# Patient Record
Sex: Female | Born: 1945 | Race: White | Hispanic: No | Marital: Married | Smoking: Current every day smoker
Health system: Southern US, Community
[De-identification: ages and names within clinical notes are randomized; demographics above are authoritative.]

## PROBLEM LIST (undated history)

## (undated) DIAGNOSIS — M1990 Unspecified osteoarthritis, unspecified site: Secondary | ICD-10-CM

## (undated) DIAGNOSIS — R591 Generalized enlarged lymph nodes: Secondary | ICD-10-CM

## (undated) DIAGNOSIS — M810 Age-related osteoporosis without current pathological fracture: Secondary | ICD-10-CM

## (undated) DIAGNOSIS — Z860100 Personal history of colon polyps, unspecified: Secondary | ICD-10-CM

## (undated) DIAGNOSIS — R519 Headache, unspecified: Secondary | ICD-10-CM

## (undated) DIAGNOSIS — F1910 Other psychoactive substance abuse, uncomplicated: Secondary | ICD-10-CM

## (undated) DIAGNOSIS — J449 Chronic obstructive pulmonary disease, unspecified: Secondary | ICD-10-CM

## (undated) DIAGNOSIS — F419 Anxiety disorder, unspecified: Secondary | ICD-10-CM

## (undated) DIAGNOSIS — C8590 Non-Hodgkin lymphoma, unspecified, unspecified site: Secondary | ICD-10-CM

## (undated) DIAGNOSIS — C801 Malignant (primary) neoplasm, unspecified: Secondary | ICD-10-CM

## (undated) DIAGNOSIS — E669 Obesity, unspecified: Secondary | ICD-10-CM

## (undated) DIAGNOSIS — F329 Major depressive disorder, single episode, unspecified: Secondary | ICD-10-CM

## (undated) DIAGNOSIS — E039 Hypothyroidism, unspecified: Secondary | ICD-10-CM

## (undated) DIAGNOSIS — E7800 Pure hypercholesterolemia, unspecified: Secondary | ICD-10-CM

## (undated) DIAGNOSIS — R599 Enlarged lymph nodes, unspecified: Secondary | ICD-10-CM

## (undated) DIAGNOSIS — I10 Essential (primary) hypertension: Secondary | ICD-10-CM

## (undated) DIAGNOSIS — F17200 Nicotine dependence, unspecified, uncomplicated: Secondary | ICD-10-CM

## (undated) HISTORY — DX: Age-related osteoporosis without current pathological fracture: M81.0

## (undated) HISTORY — DX: Major depressive disorder, single episode, unspecified: F32.9

## (undated) HISTORY — PX: DILATION AND CURETTAGE OF UTERUS: SHX78

## (undated) HISTORY — PX: FOREARM FRACTURE SURGERY: SHX649

## (undated) HISTORY — DX: Unspecified osteoarthritis, unspecified site: M19.90

## (undated) HISTORY — DX: Obesity, unspecified: E66.9

## (undated) HISTORY — DX: Other psychoactive substance abuse, uncomplicated: F19.10

## (undated) HISTORY — PX: TONSILLECTOMY: SUR1361

## (undated) HISTORY — DX: Nicotine dependence, unspecified, uncomplicated: F17.200

## (undated) HISTORY — DX: Essential (primary) hypertension: I10

## (undated) HISTORY — DX: Headache, unspecified: R51.9

## (undated) HISTORY — DX: Personal history of colon polyps, unspecified: Z86.0100

## (undated) HISTORY — DX: Hypothyroidism, unspecified: E03.9

## (undated) HISTORY — DX: Anxiety disorder, unspecified: F41.9

## (undated) HISTORY — DX: Pure hypercholesterolemia, unspecified: E78.00

## (undated) HISTORY — DX: Personal history of colonic polyps: Z86.010

---
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
## 1999-08-09 ENCOUNTER — Encounter: Payer: Self-pay | Admitting: Obstetrics and Gynecology

## 1999-08-13 ENCOUNTER — Encounter (INDEPENDENT_AMBULATORY_CARE_PROVIDER_SITE_OTHER): Payer: Self-pay | Admitting: Specialist

## 1999-08-13 ENCOUNTER — Ambulatory Visit (HOSPITAL_COMMUNITY): Admission: RE | Admit: 1999-08-13 | Discharge: 1999-08-13 | Payer: Self-pay | Admitting: Obstetrics and Gynecology

## 2000-06-21 ENCOUNTER — Encounter: Admission: RE | Admit: 2000-06-21 | Discharge: 2000-06-21 | Payer: Self-pay | Admitting: Obstetrics & Gynecology

## 2000-06-21 ENCOUNTER — Encounter: Payer: Self-pay | Admitting: Family Medicine

## 2000-11-29 ENCOUNTER — Encounter: Admission: RE | Admit: 2000-11-29 | Discharge: 2000-11-29 | Payer: Self-pay | Admitting: Family Medicine

## 2000-11-29 ENCOUNTER — Encounter: Payer: Self-pay | Admitting: Family Medicine

## 2002-01-17 ENCOUNTER — Encounter: Admission: RE | Admit: 2002-01-17 | Discharge: 2002-02-05 | Payer: Self-pay | Admitting: General Surgery

## 2002-01-17 ENCOUNTER — Encounter: Admission: RE | Admit: 2002-01-17 | Discharge: 2002-01-17 | Payer: Self-pay | Admitting: General Surgery

## 2002-01-17 ENCOUNTER — Encounter: Payer: Self-pay | Admitting: General Surgery

## 2002-01-21 ENCOUNTER — Encounter: Admission: RE | Admit: 2002-01-21 | Discharge: 2002-01-21 | Payer: Self-pay | Admitting: General Surgery

## 2002-01-21 ENCOUNTER — Encounter: Payer: Self-pay | Admitting: General Surgery

## 2002-01-28 ENCOUNTER — Encounter: Admission: RE | Admit: 2002-01-28 | Discharge: 2002-02-05 | Payer: Self-pay | Admitting: General Surgery

## 2002-02-14 ENCOUNTER — Encounter: Admission: RE | Admit: 2002-02-14 | Discharge: 2002-05-15 | Payer: Self-pay | Admitting: General Surgery

## 2002-03-13 ENCOUNTER — Inpatient Hospital Stay (HOSPITAL_COMMUNITY): Admission: RE | Admit: 2002-03-13 | Discharge: 2002-03-16 | Payer: Self-pay | Admitting: General Surgery

## 2002-03-14 ENCOUNTER — Encounter: Payer: Self-pay | Admitting: General Surgery

## 2003-07-01 ENCOUNTER — Encounter: Admission: RE | Admit: 2003-07-01 | Discharge: 2003-07-01 | Payer: Self-pay | Admitting: Family Medicine

## 2003-12-08 ENCOUNTER — Ambulatory Visit (HOSPITAL_COMMUNITY): Admission: RE | Admit: 2003-12-08 | Discharge: 2003-12-08 | Payer: Self-pay | Admitting: Gastroenterology

## 2003-12-08 ENCOUNTER — Encounter (INDEPENDENT_AMBULATORY_CARE_PROVIDER_SITE_OTHER): Payer: Self-pay | Admitting: *Deleted

## 2004-04-25 HISTORY — PX: GASTRIC BYPASS: SHX52

## 2004-04-25 HISTORY — PX: BARIATRIC SURGERY: SHX1103

## 2005-09-29 ENCOUNTER — Encounter: Admission: RE | Admit: 2005-09-29 | Discharge: 2005-09-29 | Payer: Self-pay | Admitting: Family Medicine

## 2006-10-06 ENCOUNTER — Encounter: Admission: RE | Admit: 2006-10-06 | Discharge: 2006-10-06 | Payer: Self-pay | Admitting: Family Medicine

## 2007-11-08 ENCOUNTER — Encounter: Admission: RE | Admit: 2007-11-08 | Discharge: 2007-11-08 | Payer: Self-pay | Admitting: Family Medicine

## 2008-08-26 ENCOUNTER — Ambulatory Visit: Payer: Self-pay | Admitting: Internal Medicine

## 2008-08-26 DIAGNOSIS — E039 Hypothyroidism, unspecified: Secondary | ICD-10-CM

## 2008-08-26 DIAGNOSIS — F32A Depression, unspecified: Secondary | ICD-10-CM | POA: Insufficient documentation

## 2008-08-26 DIAGNOSIS — F329 Major depressive disorder, single episode, unspecified: Secondary | ICD-10-CM | POA: Insufficient documentation

## 2008-08-26 DIAGNOSIS — I10 Essential (primary) hypertension: Secondary | ICD-10-CM

## 2008-08-26 DIAGNOSIS — F3289 Other specified depressive episodes: Secondary | ICD-10-CM

## 2008-08-26 DIAGNOSIS — M8580 Other specified disorders of bone density and structure, unspecified site: Secondary | ICD-10-CM | POA: Insufficient documentation

## 2008-08-26 HISTORY — DX: Essential (primary) hypertension: I10

## 2008-08-26 HISTORY — DX: Hypothyroidism, unspecified: E03.9

## 2008-08-26 HISTORY — DX: Major depressive disorder, single episode, unspecified: F32.9

## 2008-08-26 HISTORY — DX: Other specified depressive episodes: F32.89

## 2008-11-19 ENCOUNTER — Ambulatory Visit: Payer: Self-pay | Admitting: Internal Medicine

## 2008-11-19 LAB — CONVERTED CEMR LAB
ALT: 19 units/L (ref 0–35)
AST: 26 units/L (ref 0–37)
Albumin: 3.8 g/dL (ref 3.5–5.2)
Alkaline Phosphatase: 54 units/L (ref 39–117)
BUN: 9 mg/dL (ref 6–23)
Basophils Absolute: 0 10*3/uL (ref 0.0–0.1)
Basophils Relative: 0.4 % (ref 0.0–3.0)
Bilirubin Urine: NEGATIVE
Bilirubin, Direct: 0.1 mg/dL (ref 0.0–0.3)
CO2: 31 meq/L (ref 19–32)
Calcium: 9.2 mg/dL (ref 8.4–10.5)
Chloride: 110 meq/L (ref 96–112)
Cholesterol: 235 mg/dL — ABNORMAL HIGH (ref 0–200)
Creatinine, Ser: 0.7 mg/dL (ref 0.4–1.2)
Direct LDL: 148.5 mg/dL
Eosinophils Absolute: 0.1 10*3/uL (ref 0.0–0.7)
Eosinophils Relative: 2.2 % (ref 0.0–5.0)
GFR calc non Af Amer: 89.98 mL/min (ref >60)
Glucose, Bld: 86 mg/dL (ref 70–99)
Glucose, Urine, Semiquant: NEGATIVE
HCT: 42.5 % (ref 36.0–46.0)
HDL: 72.8 mg/dL (ref >39.00)
Hemoglobin: 14.3 g/dL (ref 12.0–15.0)
Ketones, urine, test strip: NEGATIVE
Lymphocytes Relative: 29.3 % (ref 12.0–46.0)
Lymphs Abs: 1.7 10*3/uL (ref 0.7–4.0)
MCHC: 33.6 g/dL (ref 30.0–36.0)
MCV: 95.2 fL (ref 78.0–100.0)
Monocytes Absolute: 0.4 10*3/uL (ref 0.1–1.0)
Monocytes Relative: 6.7 % (ref 3.0–12.0)
Neutro Abs: 3.7 10*3/uL (ref 1.4–7.7)
Neutrophils Relative %: 61.4 % (ref 43.0–77.0)
Nitrite: NEGATIVE
Platelets: 267 10*3/uL (ref 150.0–400.0)
Potassium: 4.3 meq/L (ref 3.5–5.1)
Protein, U semiquant: NEGATIVE
RBC: 4.47 M/uL (ref 3.87–5.11)
RDW: 13.2 % (ref 11.5–14.6)
Sodium: 146 meq/L — ABNORMAL HIGH (ref 135–145)
Specific Gravity, Urine: 1.01
TSH: 1.55 microintl units/mL (ref 0.35–5.50)
Total Bilirubin: 0.7 mg/dL (ref 0.3–1.2)
Total CHOL/HDL Ratio: 3
Total Protein: 6.7 g/dL (ref 6.0–8.3)
Triglycerides: 104 mg/dL (ref 0.0–149.0)
Urobilinogen, UA: 0.2
VLDL: 20.8 mg/dL (ref 0.0–40.0)
WBC Urine, dipstick: NEGATIVE
WBC: 5.9 10*3/uL (ref 4.5–10.5)
pH: 7

## 2008-11-25 ENCOUNTER — Encounter: Payer: Self-pay | Admitting: Internal Medicine

## 2008-11-26 ENCOUNTER — Ambulatory Visit: Payer: Self-pay | Admitting: Internal Medicine

## 2008-11-26 ENCOUNTER — Other Ambulatory Visit: Admission: RE | Admit: 2008-11-26 | Discharge: 2008-11-26 | Payer: Self-pay | Admitting: Internal Medicine

## 2008-11-26 ENCOUNTER — Encounter: Payer: Self-pay | Admitting: Internal Medicine

## 2009-01-13 ENCOUNTER — Telehealth: Payer: Self-pay | Admitting: Internal Medicine

## 2009-05-28 ENCOUNTER — Ambulatory Visit: Payer: Self-pay | Admitting: Internal Medicine

## 2009-12-25 ENCOUNTER — Ambulatory Visit: Payer: Self-pay | Admitting: Internal Medicine

## 2009-12-25 DIAGNOSIS — F17200 Nicotine dependence, unspecified, uncomplicated: Secondary | ICD-10-CM

## 2009-12-25 HISTORY — DX: Nicotine dependence, unspecified, uncomplicated: F17.200

## 2010-01-11 ENCOUNTER — Ambulatory Visit: Payer: Self-pay | Admitting: Internal Medicine

## 2010-04-30 ENCOUNTER — Other Ambulatory Visit: Payer: Self-pay | Admitting: Internal Medicine

## 2010-04-30 ENCOUNTER — Ambulatory Visit
Admission: RE | Admit: 2010-04-30 | Discharge: 2010-04-30 | Payer: Self-pay | Source: Home / Self Care | Attending: Internal Medicine | Admitting: Internal Medicine

## 2010-04-30 LAB — BASIC METABOLIC PANEL
BUN: 11 mg/dL (ref 6–23)
CO2: 27 mEq/L (ref 19–32)
Calcium: 9.2 mg/dL (ref 8.4–10.5)
Chloride: 108 mEq/L (ref 96–112)
Creatinine, Ser: 0.7 mg/dL (ref 0.4–1.2)
GFR: 85.33 mL/min (ref >60.00)
Glucose, Bld: 86 mg/dL (ref 70–99)
Potassium: 3.9 mEq/L (ref 3.5–5.1)
Sodium: 145 mEq/L (ref 135–145)

## 2010-04-30 LAB — LDL CHOLESTEROL, DIRECT: Direct LDL: 133.4 mg/dL

## 2010-04-30 LAB — CBC WITH DIFFERENTIAL/PLATELET
Basophils Absolute: 0 10*3/uL (ref 0.0–0.1)
Basophils Relative: 0.7 % (ref 0.0–3.0)
Eosinophils Absolute: 0.1 10*3/uL (ref 0.0–0.7)
Eosinophils Relative: 1 % (ref 0.0–5.0)
HCT: 41.1 % (ref 36.0–46.0)
Hemoglobin: 13.7 g/dL (ref 12.0–15.0)
Lymphocytes Relative: 30.3 % (ref 12.0–46.0)
Lymphs Abs: 1.7 10*3/uL (ref 0.7–4.0)
MCHC: 33.3 g/dL (ref 30.0–36.0)
MCV: 96 fl (ref 78.0–100.0)
Monocytes Absolute: 0.4 10*3/uL (ref 0.1–1.0)
Monocytes Relative: 8 % (ref 3.0–12.0)
Neutro Abs: 3.3 10*3/uL (ref 1.4–7.7)
Neutrophils Relative %: 60 % (ref 43.0–77.0)
Platelets: 295 10*3/uL (ref 150.0–400.0)
RBC: 4.28 Mil/uL (ref 3.87–5.11)
RDW: 13.5 % (ref 11.5–14.6)
WBC: 5.6 10*3/uL (ref 4.5–10.5)

## 2010-04-30 LAB — HEPATIC FUNCTION PANEL
ALT: 21 U/L (ref 0–35)
AST: 21 U/L (ref 0–37)
Albumin: 3.5 g/dL (ref 3.5–5.2)
Alkaline Phosphatase: 67 U/L (ref 39–117)
Bilirubin, Direct: 0.1 mg/dL (ref 0.0–0.3)
Total Bilirubin: 0.6 mg/dL (ref 0.3–1.2)
Total Protein: 6.2 g/dL (ref 6.0–8.3)

## 2010-04-30 LAB — CONVERTED CEMR LAB
Bilirubin Urine: NEGATIVE
Glucose, Urine, Semiquant: NEGATIVE
Ketones, urine, test strip: NEGATIVE
Nitrite: NEGATIVE
Protein, U semiquant: NEGATIVE
Specific Gravity, Urine: 1.01
Urobilinogen, UA: 0.2
WBC Urine, dipstick: NEGATIVE
pH: 5

## 2010-04-30 LAB — LIPID PANEL
Cholesterol: 210 mg/dL — ABNORMAL HIGH (ref 0–200)
HDL: 64 mg/dL (ref >39.00)
Total CHOL/HDL Ratio: 3
Triglycerides: 110 mg/dL (ref 0.0–149.0)
VLDL: 22 mg/dL (ref 0.0–40.0)

## 2010-04-30 LAB — TSH: TSH: 0.07 u[IU]/mL — ABNORMAL LOW (ref 0.35–5.50)

## 2010-05-07 ENCOUNTER — Ambulatory Visit
Admission: RE | Admit: 2010-05-07 | Discharge: 2010-05-07 | Payer: Self-pay | Source: Home / Self Care | Attending: Internal Medicine | Admitting: Internal Medicine

## 2010-05-07 ENCOUNTER — Other Ambulatory Visit
Admission: RE | Admit: 2010-05-07 | Discharge: 2010-05-07 | Payer: Self-pay | Source: Home / Self Care | Admitting: Internal Medicine

## 2010-05-07 ENCOUNTER — Encounter: Payer: Self-pay | Admitting: Internal Medicine

## 2010-05-07 LAB — HM PAP SMEAR

## 2010-05-25 NOTE — Progress Notes (Signed)
  Phone Note From Pharmacy   Caller: sara from [facility] Summary of Call: patient spilled coffee over her xanax bottle- can she have an early refill?    ph- [phone] Initial call taken by: Raechel Ache, RN,  January 13, 2009 9:51 AM  Follow-up for Phone Call        ok Follow-up by: Gordy Savers  MD,  January 13, 2009 12:51 PM  Additional Follow-up for Phone Call Additional follow up Details #1::        Pharmacist called Additional Follow-up by: Raechel Ache, RN,  January 13, 2009 1:23 PM

## 2010-05-25 NOTE — Assessment & Plan Note (Signed)
Summary: new pt establish/jls   Vital Signs:  Patient profile:   65 year old female Height:      64 inches Weight:      196 pounds BMI:     33.76 Temp:     98 degrees F oral Pulse rate:   64 / minute Pulse rhythm:   regular BP sitting:   128 / 66  (left arm) Cuff size:   regular  Vitals Entered By: Raechel Ache, RN (Aug 26, 2008 2:54 PM) CC: New, to Establish   CC:  New and to Establish.  History of Present Illness: 65 year old patient seen today to establish with our practice.  Medical problems include exogenous obesity.  Two status post gastric bypass in 2003.  She has treated hypertension.  History depression, and hypothyroidism.  She has osteopenia.  She also is a history of ongoing tobacco use  Preventive Screening-Counseling & Management     Smoking Status: current     Does Patient Exercise: yes  Allergies (verified): 1)  ! Astramorph (Morphine Sulfate)  Past History:  Past Medical History:    Hypertension    Osteopenia    obesity    tobacco abuse    Hypothyroidism    Depression  Past Surgical History:    gastric bypass surgery in November 2003    surgery for fracture, right arm, October 2006    D&Cs x 2    C-section 1980    gravida 3, para 3, abortus zero    negative stress echocardiogram July 2009    colonoscopy due Summer 2010 ( Dr. Loreta Ave)  Family History:    Reviewed history and no changes required:        father died age 53, cornea artery disease, congestive heart failure       mother died age 46.  History colon cancer cannot dementia       one brother, status post CABG       two sisters are well       her sister history of coronary artery disease, status post pacemaker insertion  Social History:    Reviewed history and no changes required:       Married       Current Smoker       Regular exercise-yes       3 adult children, 7 grandchildren       work for the [employer]    Smoking Status:  current    Does Patient Exercise:   yes  Review of Systems  The patient denies anorexia, fever, weight loss, weight gain, vision loss, decreased hearing, hoarseness, chest pain, syncope, dyspnea on exertion, peripheral edema, prolonged cough, headaches, hemoptysis, abdominal pain, melena, hematochezia, severe indigestion/heartburn, hematuria, incontinence, genital sores, muscle weakness, suspicious skin lesions, transient blindness, difficulty walking, depression, unusual weight change, abnormal bleeding, enlarged lymph nodes, angioedema, and breast masses.    Physical Exam  General:  overweight-appearing.  130/80 Head:  Normocephalic and atraumatic without obvious abnormalities. No apparent alopecia or balding. Eyes:  No corneal or conjunctival inflammation noted. EOMI. Perrla. Funduscopic exam benign, without hemorrhages, exudates or papilledema. Vision grossly normal. Ears:  External ear exam shows no significant lesions or deformities.  Otoscopic examination reveals clear canals, tympanic membranes are intact bilaterally without bulging, retraction, inflammation or discharge. Hearing is grossly normal bilaterally. Mouth:  Oral mucosa and oropharynx without lesions or exudates.  Teeth in good repair. Neck:  No deformities, masses, or tenderness noted. Chest Wall:  No  deformities, masses, or tenderness noted. Lungs:  Normal respiratory effort, chest expands symmetrically. Lungs are clear to auscultation, no crackles or wheezes. Heart:  Normal rate and regular rhythm. S1 and S2 normal without gallop, murmur, click, rub or other extra sounds. Abdomen:  Bowel sounds positive,abdomen soft and non-tender without masses, organomegaly or hernias noted. Msk:  No deformity or scoliosis noted of thoracic or lumbar spine.   Pulses:  R and L carotid,radial,femoral,dorsalis pedis and posterior tibial pulses are full and equal bilaterally Extremities:  No clubbing, cyanosis, edema, or deformity noted with normal full range of motion of all  joints.   Neurologic:  No cranial nerve deficits noted. Station and gait are normal. Plantar reflexes are down-going bilaterally. DTRs are symmetrical throughout. Sensory, motor and coordinative functions appear intact. Skin:  Intact without suspicious lesions or rashes Cervical Nodes:  No lymphadenopathy noted Axillary Nodes:  No palpable lymphadenopathy Inguinal Nodes:  No significant adenopathy Psych:  Cognition and judgment appear intact. Alert and cooperative with normal attention span and concentration. No apparent delusions, illusions, hallucinations   Impression & Recommendations:  Problem # 1:  DEPRESSION (ICD-311)  Her updated medication list for this problem includes:    Sertraline Hcl 100 Mg Tabs (Sertraline hcl) .[name]... 1 once daily    Budeprion Sr 150 Mg Xr12h-tab (Bupropion hcl) .[name]... 1 two times a day    Alprazolam 0.5 Mg Tabs (Alprazolam) .[name]... 1 two times a day  Problem # 2:  HYPOTHYROIDISM (ICD-244.9)  Her updated medication list for this problem includes:    Levothyroxine Sodium 150 Mcg Tabs (Levothyroxine sodium) .[name]... 1 once daily  Problem # 3:  OSTEOPENIA (ICD-733.90)  Problem # 4:  HYPERTENSION (ICD-401.9)  Her updated medication list for this problem includes:    Lisinopril 20 Mg Tabs (Lisinopril) .[name]... 1 once daily  Complete Medication List: 1)  Sertraline Hcl 100 Mg Tabs (Sertraline hcl) .[name].. 1 once daily 2)  Budeprion Sr 150 Mg Xr12h-tab (Bupropion hcl) .[name].. 1 two times a day 3)  Levothyroxine Sodium 150 Mcg Tabs (Levothyroxine sodium) .[name].. 1 once daily 4)  Alprazolam 0.5 Mg Tabs (Alprazolam) .[name].. 1 two times a day 5)  Lisinopril 20 Mg Tabs (Lisinopril) .[name].. 1 once daily  Patient Instructions: 1)  Limit your Sodium (Salt). 2)  It is important that you exercise regularly at least 20 minutes 5 times a week. If you develop chest pain, have severe difficulty breathing, or feel very tired , stop exercising immediately and seek medical attention. 3)  You  need to lose weight. Consider a lower calorie diet and regular exercise.  4)  Take calcium +Vitamin D daily. 5)  Check your Blood Pressure regularly. If it is above: 140/90 you should make an appointment. 6)  annual CPX 3 months Prescriptions: LISINOPRIL 20 MG TABS (LISINOPRIL) 1 once daily  #90 x 6   Entered and Authorized by:   Gordy Savers  MD   Signed by:   Gordy Savers  MD on 08/26/2008   Method used:   Print then Give to Patient   RxID:   [id number] ALPRAZOLAM 0.5 MG TABS (ALPRAZOLAM) 1 two times a day  #180 x 6   Entered and Authorized by:   Gordy Savers  MD   Signed by:   Gordy Savers  MD on 08/26/2008   Method used:   Print then Give to Patient   RxID:   [id number] LEVOTHYROXINE SODIUM 150 MCG TABS (LEVOTHYROXINE SODIUM) 1 once daily  #90  x 6   Entered and Authorized by:   Gordy Savers  MD   Signed by:   Gordy Savers  MD on 08/26/2008   Method used:   Print then Give to Patient   RxID:   [id number] BUDEPRION SR 150 MG XR12H-TAB (BUPROPION HCL) 1 two times a day  #90 x 6   Entered and Authorized by:   Gordy Savers  MD   Signed by:   Gordy Savers  MD on 08/26/2008   Method used:   Print then Give to Patient   RxID:   [id number] SERTRALINE HCL 100 MG TABS (SERTRALINE HCL) 1 once daily  #90 x 6   Entered and Authorized by:   Gordy Savers  MD   Signed by:   Gordy Savers  MD on 08/26/2008   Method used:   Print then Give to Patient   RxID:   [id number]

## 2010-05-25 NOTE — Miscellaneous (Signed)
Summary: Controlled Substance Agreement  Controlled Substance Agreement   Imported By: Lanelle Bal 12/31/2009 10:21:15  _____________________________________________________________________  External Attachment:    Type:   Image     Comment:   External Document

## 2010-05-25 NOTE — Assessment & Plan Note (Signed)
Summary: 6 month rov/njr   Vital Signs:  Patient profile:   65 year old female Menstrual status:  postmenopausal Weight:      205 pounds Temp:     97.9 degrees F oral BP sitting:   124 / 78  (left arm) Cuff size:   regular  Vitals Entered By: Duard Brady LPN (December 25, 2009 8:06 AM) CC: 6 mos rov - doing ok Is Patient Diabetic? No   CC:  6 mos rov - doing ok.  History of Present Illness: a 65 year old patient who is in today for follow-up.  She has a history of anxiety, depression, and is being followed by psychiatry.  She has treated hypertension, osteopenia.  She has a history of ongoing tobacco use.  She has recently retired.  Is asking for a increase in her Xanax dose to t.i.d. and she was asked to discuss this with Dr. Nolen Mu.  For the past 6 weeks she feels that she may have restless leg syndrome.  She describes a sense of vague discomfort inability to get comfortable at night only.  It is somewhat relieved by stretching and walking.  This only occurs approximately once per week.  She does have treated hypertension, which has been stable  Preventive Screening-Counseling & Management  Alcohol-Tobacco     Smoking Status: current     Smoking Cessation Counseling: yes  Allergies: 1)  ! Astramorph (Morphine Sulfate)  Past History:  Past Medical History: Hypertension Osteopenia obesity tobacco abuse Hypothyroidism Depression possible mild restless leg syndrome  Past Surgical History: Reviewed history from 08/26/2008 and no changes required. gastric bypass surgery in November 2003 surgery for fracture, right arm, October 2006 D&Cs x 2 C-section 1980 gravida 3, para 3, abortus zero negative stress echocardiogram July 2009 colonoscopy due Summer 2010 ( Dr. Loreta Ave)  Family History: Reviewed history from 11/26/2008 and no changes required.  father died age 88, cornary  artery disease, congestive heart failure mother died age 42.  History colon cancer;   dementia one brother, status post CABG two sisters are well her sister history of coronary artery disease, status post pacemaker insertion  Social History: Reviewed history from 05/28/2009 and no changes required. Married Current Smoker Regular exercise-yes 3 adult children, 7 grandchildren work for the [employer]-retired March 2011  Review of Systems  The patient denies anorexia, fever, weight loss, weight gain, vision loss, decreased hearing, hoarseness, chest pain, syncope, dyspnea on exertion, peripheral edema, prolonged cough, headaches, hemoptysis, abdominal pain, melena, hematochezia, severe indigestion/heartburn, hematuria, incontinence, genital sores, muscle weakness, suspicious skin lesions, transient blindness, difficulty walking, depression, unusual weight change, abnormal bleeding, enlarged lymph nodes, angioedema, and breast masses.    Physical Exam  General:  overweight-appearing.  normal blood pressureoverweight-appearing.   Head:  Normocephalic and atraumatic without obvious abnormalities. No apparent alopecia or balding. Eyes:  No corneal or conjunctival inflammation noted. EOMI. Perrla. Funduscopic exam benign, without hemorrhages, exudates or papilledema. Vision grossly normal. Mouth:  Oral mucosa and oropharynx without lesions or exudates.  Teeth in good repair. Neck:  No deformities, masses, or tenderness noted. Lungs:  Normal respiratory effort, chest expands symmetrically. Lungs are clear to auscultation, no crackles or wheezes. Heart:  Normal rate and regular rhythm. S1 and S2 normal without gallop, murmur, click, rub or other extra sounds. grade 2/6 systolic murmur at the primary aortic area Abdomen:  Bowel sounds positive,abdomen soft and non-tender without masses, organomegaly or hernias noted. Msk:  No deformity or scoliosis noted of thoracic or lumbar  spine.   Pulses:  R and L carotid,radial,femoral,dorsalis pedis and posterior tibial pulses are  full and equal bilaterally Extremities:  No clubbing, cyanosis, edema, or deformity noted with normal full range of motion of all joints.     Impression & Recommendations:  Problem # 1:  DEPRESSION (ICD-311)  Her updated medication list for this problem includes:    Sertraline Hcl 100 Mg Tabs (Sertraline hcl) .[name]... 11/2  once daily    Budeprion Sr 150 Mg Xr12h-tab (Bupropion hcl) .[name]... 1 two times a day    Alprazolam 0.5 Mg Tabs (Alprazolam) .[name]... 1 two times a day  Her updated medication list for this problem includes:    Sertraline Hcl 100 Mg Tabs (Sertraline hcl) .[name]... 11/2  once daily    Budeprion Sr 150 Mg Xr12h-tab (Bupropion hcl) .[name]... 1 two times a day    Alprazolam 0.5 Mg Tabs (Alprazolam) .[name]... 1 two times a day  Problem # 2:  HYPOTHYROIDISM (ICD-244.9)  Her updated medication list for this problem includes:    Levothyroxine Sodium 150 Mcg Tabs (Levothyroxine sodium) .[name]... 1 once daily  Her updated medication list for this problem includes:    Levothyroxine Sodium 150 Mcg Tabs (Levothyroxine sodium) .[name]... 1 once daily  Problem # 3:  HYPERTENSION (ICD-401.9)  Her updated medication list for this problem includes:    Lisinopril 20 Mg Tabs (Lisinopril) .[name]... 1 once daily  Her updated medication list for this problem includes:    Lisinopril 20 Mg Tabs (Lisinopril) .[name]... 1 once daily  Problem # 4:  TOBACCO USER (ICD-305.1)  Her updated medication list for this problem includes:    Chantix Starting Month Pak 0.5 Mg X 11 & 1 Mg X 42 Tabs (Varenicline tartrate) .[name]... As directed    Chantix 1 Mg Tabs (Varenicline tartrate) ..... One twice daily cessation of tobacco use.  Discussed  Her updated medication list for this problem includes:    Chantix Starting Month Pak 0.5 Mg X 11 & 1 Mg X 42 Tabs (Varenicline tartrate) .[name]... As directed    Chantix 1 Mg Tabs (Varenicline tartrate) ..... One twice daily  Complete Medication List: 1)  Sertraline Hcl 100 Mg Tabs (Sertraline  hcl) .[name].. 11/2  once daily 2)  Budeprion Sr 150 Mg Xr12h-tab (Bupropion hcl) .[name].. 1 two times a day 3)  Levothyroxine Sodium 150 Mcg Tabs (Levothyroxine sodium) .[name].. 1 once daily 4)  Alprazolam 0.5 Mg Tabs (Alprazolam) .[name].. 1 two times a day 5)  Lisinopril 20 Mg Tabs (Lisinopril) .[name].. 1 once daily 6)  Chantix Starting Month Pak 0.5 Mg X 11 & 1 Mg X 42 Tabs (Varenicline tartrate) .... As directed 7)  Chantix 1 Mg Tabs (Varenicline tartrate) .... One twice daily  Patient Instructions: 1)  Please schedule a follow-up appointment in 4 months for CPX 2)  Limit your Sodium (Salt) to less than 2 grams a day(slightly less than 1/2 a teaspoon) to prevent fluid retention, swelling, or worsening of symptoms. 3)  Tobacco is very bad for your health and your loved ones! You Should stop smoking!. 4)  It is important that you exercise regularly at least 20 minutes 5 times a week. If you develop chest pain, have severe difficulty breathing, or feel very tired , stop exercising immediately and seek medical attention. 5)  You need to lose weight. Consider a lower calorie diet and regular exercise.  6)  Schedule a colonoscopy/sigmoidoscopy to help detect colon cancer. 7)  Take calcium +Vitamin D daily. Prescriptions: LISINOPRIL  20 MG TABS (LISINOPRIL) 1 once daily  #90 Tablet x 2   Entered and Authorized by:   Gordy Savers  MD   Signed by:   Gordy Savers  MD on 12/25/2009   Method used:   Print then Give to Patient   RxID:   [id number] ALPRAZOLAM 0.5 MG TABS (ALPRAZOLAM) 1 two times a day  #180 x 3   Entered and Authorized by:   Gordy Savers  MD   Signed by:   Gordy Savers  MD on 12/25/2009   Method used:   Print then Give to Patient   RxID:   [id number] LEVOTHYROXINE SODIUM 150 MCG TABS (LEVOTHYROXINE SODIUM) 1 once daily  #90 x 6   Entered and Authorized by:   Gordy Savers  MD   Signed by:   Gordy Savers  MD on 12/25/2009   Method used:   Print then  Give to Patient   RxID:   [id number] BUDEPRION SR 150 MG XR12H-TAB (BUPROPION HCL) 1 two times a day  #90 Tablet x 3   Entered and Authorized by:   Gordy Savers  MD   Signed by:   Gordy Savers  MD on 12/25/2009   Method used:   Print then Give to Patient   RxID:   [id number] SERTRALINE HCL 100 MG TABS (SERTRALINE HCL) 11/2  once daily  #90 Tablet x 4   Entered and Authorized by:   Gordy Savers  MD   Signed by:   Gordy Savers  MD on 12/25/2009   Method used:   Print then Give to Patient   RxID:   [id number]

## 2010-05-25 NOTE — Assessment & Plan Note (Signed)
Summary: CPX-PAP//CCM   Vital Signs:  Patient profile:   65 year old female Menstrual status:  postmenopausal Height:      63.75 inches Weight:      200 pounds Pulse rate:   68 / minute BP sitting:   124 / 70  (left arm)  Vitals Entered By: Gladis Riffle, RN (November 26, 2008 8:47 AM)  Visit Type:  cpx, pap   History of Present Illness: 65 year old patient seen today for a health maintenance exam.  Medical problems include depression, hypothyroidism, and hypertension.  She is doing quite well.  She has a history of chronic PVCs and had a negative stress echo approximately 1 year ago.  She has a strong  family history of coronary artery disease affecting female patients.  complaints today include worsening depression.  Preventive Screening-Counseling & Management  Alcohol-Tobacco     Smoking Cessation Counseling: yes  Problems Prior to Update: 1)  Depression  (ICD-311) 2)  Hypothyroidism  (ICD-244.9) 3)  Osteopenia  (ICD-733.90) 4)  Hypertension  (ICD-401.9)  Medications Prior to Update: 1)  Sertraline Hcl 100 Mg Tabs (Sertraline Hcl) .[name].. 1 Once Daily 2)  Budeprion Sr 150 Mg Xr12h-Tab (Bupropion Hcl) .[name].. 1 Two Times A Day 3)  Levothyroxine Sodium 150 Mcg Tabs (Levothyroxine Sodium) .[name].. 1 Once Daily 4)  Alprazolam 0.5 Mg Tabs (Alprazolam) .[name].. 1 Two Times A Day 5)  Lisinopril 20 Mg Tabs (Lisinopril) .[name].. 1 Once Daily  Allergies: 1)  ! Astramorph (Morphine Sulfate)  Comments:  Nurse/Medical Assistant: cpx, pap; labs done--requests ekg and cxr--states antidepressants are not as effective as before  The patient's medications and allergies were reviewed with the patient and were updated in the Medication and Allergy Lists. Gladis Riffle, RN (November 26, 2008 8:48 AM)  Past History:  Past Medical History: Reviewed history from 08/26/2008 and no changes required. Hypertension Osteopenia obesity tobacco abuse Hypothyroidism Depression  Past Surgical History: Reviewed  history from 08/26/2008 and no changes required. gastric bypass surgery in November 2003 surgery for fracture, right arm, October 2006 D&Cs x 2 C-section 1980 gravida 3, para 3, abortus zero negative stress echocardiogram July 2009 colonoscopy due Summer 2010 ( Dr. Loreta Ave)  Family History: Reviewed history from 08/26/2008 and no changes required.  father died age 39, cornary  artery disease, congestive heart failure mother died age 20.  History colon cancer;  dementia one brother, status post CABG two sisters are well her sister history of coronary artery disease, status post pacemaker insertion  Social History: Reviewed history from 08/26/2008 and no changes required. Married Current Smoker Regular exercise-yes 3 adult children, 7 grandchildren work for the [employer]  Review of Systems       The patient complains of depression.  The patient denies anorexia, fever, weight loss, weight gain, vision loss, decreased hearing, hoarseness, chest pain, syncope, dyspnea on exertion, peripheral edema, prolonged cough, headaches, hemoptysis, abdominal pain, melena, hematochezia, severe indigestion/heartburn, hematuria, incontinence, genital sores, muscle weakness, suspicious skin lesions, transient blindness, difficulty walking, unusual weight change, abnormal bleeding, enlarged lymph nodes, angioedema, and breast masses.    Physical Exam  General:  overweight-appearing.  normal blood pressureoverweight-appearing.   Head:  Normocephalic and atraumatic without obvious abnormalities. No apparent alopecia or balding. Eyes:  No corneal or conjunctival inflammation noted. EOMI. Perrla. Funduscopic exam benign, without hemorrhages, exudates or papilledema. Vision grossly normal. Ears:  External ear exam shows no significant lesions or deformities.  Otoscopic examination reveals clear canals, tympanic membranes are intact bilaterally  without bulging, retraction, inflammation or discharge.  Hearing is grossly normal bilaterally. Mouth:  Oral mucosa and oropharynx without lesions or exudates.  Teeth in good repair. Neck:  No deformities, masses, or tenderness noted. Chest Wall:  No deformities, masses, or tenderness noted. Breasts:  No mass, nodules, thickening, tenderness, bulging, retraction, inflamation, nipple discharge or skin changes noted.   Lungs:  Normal respiratory effort, chest expands symmetrically. Lungs are clear to auscultation, no crackles or wheezes. Heart:  Normal rate and regular rhythm. S1 and S2 normal without gallop, murmur, click, rub or other extra sounds. Abdomen:  Bowel sounds positive,abdomen soft and non-tender without masses, organomegaly or hernias noted. Rectal:  No external abnormalities noted. Normal sphincter tone. No rectal masses or tenderness. Genitalia:  Normal introitus for age, no external lesions, no vaginal discharge, mucosa pink and moist, no vaginal or cervical lesions, no vaginal atrophy, no friaility or hemorrhage, normal uterus size and position, no adnexal masses or tenderness Msk:  No deformity or scoliosis noted of thoracic or lumbar spine.   Pulses:  R and L carotid,radial,femoral,dorsalis pedis and posterior tibial pulses are full and equal bilaterally Extremities:  No clubbing, cyanosis, edema, or deformity noted with normal full range of motion of all joints.   Neurologic:  No cranial nerve deficits noted. Station and gait are normal. Plantar reflexes are down-going bilaterally. DTRs are symmetrical throughout. Sensory, motor and coordinative functions appear intact. Skin:  Intact without suspicious lesions or rashes Cervical Nodes:  No lymphadenopathy noted Axillary Nodes:  No palpable lymphadenopathy Inguinal Nodes:  No significant adenopathy Psych:  Cognition and judgment appear intact. Alert and cooperative with normal attention span and concentration. No apparent delusions, illusions, hallucinations   Impression &  Recommendations:  Problem # 1:  DEPRESSION (ICD-311)  Her updated medication list for this problem includes:    Sertraline Hcl 100 Mg Tabs (Sertraline hcl) .[name]... 1 once daily    Budeprion Sr 150 Mg Xr12h-tab (Bupropion hcl) .[name]... 1 two times a day    Alprazolam 0.5 Mg Tabs (Alprazolam) .[name]... 1 two times a day  Orders: Psychiatric Referral (Psych)  Problem # 2:  HYPOTHYROIDISM (ICD-244.9)  Her updated medication list for this problem includes:    Levothyroxine Sodium 150 Mcg Tabs (Levothyroxine sodium) .[name]... 1 once daily  Her updated medication list for this problem includes:    Levothyroxine Sodium 150 Mcg Tabs (Levothyroxine sodium) .[name]... 1 once daily  Problem # 3:  HYPERTENSION (ICD-401.9)  Her updated medication list for this problem includes:    Lisinopril 20 Mg Tabs (Lisinopril) .[name]... 1 once daily  Orders: EKG w/ Interpretation (93000)  Her updated medication list for this problem includes:    Lisinopril 20 Mg Tabs (Lisinopril) .[name]... 1 once daily  Complete Medication List: 1)  Sertraline Hcl 100 Mg Tabs (Sertraline hcl) .[name].. 1 once daily 2)  Budeprion Sr 150 Mg Xr12h-tab (Bupropion hcl) .[name].. 1 two times a day 3)  Levothyroxine Sodium 150 Mcg Tabs (Levothyroxine sodium) .[name].. 1 once daily 4)  Alprazolam 0.5 Mg Tabs (Alprazolam) .[name].. 1 two times a day 5)  Lisinopril 20 Mg Tabs (Lisinopril) .[name].. 1 once daily  Other Orders: Pap Smear, Thin Prep ( Collection of) (Z6109)  Patient Instructions: 1)  Please schedule a follow-up appointment in 6 months. 2)  Limit your Sodium (Salt). 3)  Tobacco is very bad for your health and your loved ones! You Should stop smoking!. 4)  It is important that you exercise regularly at least 20 minutes 5 times  a week. If you develop chest pain, have severe difficulty breathing, or feel very tired , stop exercising immediately and seek medical attention. 5)  You need to lose weight. Consider a lower calorie diet and regular exercise.  6)   Schedule a colonoscopy/sigmoidoscopy to help detect colon cancer. 7)  Take calcium +Vitamin D daily. 8)  Check your Blood Pressure regularly. If it is above: 140/90  you should make an appointment. 9)  Schedule your mammogram. Prescriptions: LISINOPRIL 20 MG TABS (LISINOPRIL) 1 once daily  #90 x 6   Entered and Authorized by:   Gordy Savers  MD   Signed by:   Gordy Savers  MD on 11/26/2008   Method used:   Print then Give to Patient   RxID:   [id number] ALPRAZOLAM 0.5 MG TABS (ALPRAZOLAM) 1 two times a day  #180 x 6   Entered and Authorized by:   Gordy Savers  MD   Signed by:   Gordy Savers  MD on 11/26/2008   Method used:   Print then Give to Patient   RxID:   [id number] LEVOTHYROXINE SODIUM 150 MCG TABS (LEVOTHYROXINE SODIUM) 1 once daily  #90 x 6   Entered and Authorized by:   Gordy Savers  MD   Signed by:   Gordy Savers  MD on 11/26/2008   Method used:   Print then Give to Patient   RxID:   [id number] BUDEPRION SR 150 MG XR12H-TAB (BUPROPION HCL) 1 two times a day  #90 x 6   Entered and Authorized by:   Gordy Savers  MD   Signed by:   Gordy Savers  MD on 11/26/2008   Method used:   Print then Give to Patient   RxID:   [id number] SERTRALINE HCL 100 MG TABS (SERTRALINE HCL) 1 once daily  #90 x 6   Entered and Authorized by:   Gordy Savers  MD   Signed by:   Gordy Savers  MD on 11/26/2008   Method used:   Print then Give to Patient   RxID:   [id number]

## 2010-05-25 NOTE — Assessment & Plan Note (Signed)
Summary: 6 month rov./njr   Vital Signs:  Patient profile:   65 year old female Menstrual status:  postmenopausal Weight:      200 pounds BMI:     26.04 Temp:     98.0 degrees F oral BP sitting:   110 / 70  (left arm)  Vitals Entered By: Raechel Ache, RN (May 28, 2009 8:01 AM) CC: 6 mo ROV   CC:  6 mo ROV.  History of Present Illness: 65 year old patient who is in today for follow-up of her hypertension, osteopenia, hypothyroidism.  She is followed by behavioral health for depression, which has been stable.  She has no concerns or complaints.  She is excited about retirement at the end of this month.  Preventive Screening-Counseling & Management  Alcohol-Tobacco     Smoking Cessation Counseling: yes  Allergies: 1)  ! Astramorph (Morphine Sulfate)  Past History:  Past Medical History: Reviewed history from 08/26/2008 and no changes required. Hypertension Osteopenia obesity tobacco abuse Hypothyroidism Depression  Social History: Reviewed history from 08/26/2008 and no changes required. Married Current Smoker Regular exercise-yes 3 adult children, 7 grandchildren work for the [employer]-retired March 2011  Physical Exam  General:  overweight-appearing.  110/70overweight-appearing.   Head:  Normocephalic and atraumatic without obvious abnormalities. No apparent alopecia or balding. Eyes:  No corneal or conjunctival inflammation noted. EOMI. Perrla. Funduscopic exam benign, without hemorrhages, exudates or papilledema. Vision grossly normal. Mouth:  Oral mucosa and oropharynx without lesions or exudates.  Teeth in good repair. Neck:  No deformities, masses, or tenderness noted. Lungs:  Normal respiratory effort, chest expands symmetrically. Lungs are clear to auscultation, no crackles or wheezes. Heart:  Normal rate and regular rhythm. S1 and S2 normal without gallop, murmur, click, rub or other extra sounds. Abdomen:  Bowel sounds  positive,abdomen soft and non-tender without masses, organomegaly or hernias noted. Msk:  No deformity or scoliosis noted of thoracic or lumbar spine.   Pulses:  R and L carotid,radial,femoral,dorsalis pedis and posterior tibial pulses are full and equal bilaterally Extremities:  No clubbing, cyanosis, edema, or deformity noted with normal full range of motion of all joints.     Impression & Recommendations:  Problem # 1:  DEPRESSION (ICD-311)  Her updated medication list for this problem includes:    Sertraline Hcl 100 Mg Tabs (Sertraline hcl) .[name]... 11/2  once daily    Budeprion Sr 150 Mg Xr12h-tab (Bupropion hcl) .[name]... 1 two times a day    Alprazolam 0.5 Mg Tabs (Alprazolam) .[name]... 1 two times a day  Her updated medication list for this problem includes:    Sertraline Hcl 100 Mg Tabs (Sertraline hcl) .[name]... 11/2  once daily    Budeprion Sr 150 Mg Xr12h-tab (Bupropion hcl) .[name]... 1 two times a day    Alprazolam 0.5 Mg Tabs (Alprazolam) .[name]... 1 two times a day  Problem # 2:  HYPOTHYROIDISM (ICD-244.9)  Her updated medication list for this problem includes:    Levothyroxine Sodium 150 Mcg Tabs (Levothyroxine sodium) .[name]... 1 once daily  Her updated medication list for this problem includes:    Levothyroxine Sodium 150 Mcg Tabs (Levothyroxine sodium) .[name]... 1 once daily  Problem # 3:  HYPERTENSION (ICD-401.9)  Her updated medication list for this problem includes:    Lisinopril 20 Mg Tabs (Lisinopril) .[name]... 1 once daily  Her updated medication list for this problem includes:    Lisinopril 20 Mg Tabs (Lisinopril) .[name]... 1 once daily  Complete Medication List: 1)  Sertraline Hcl 100 Mg Tabs (Sertraline hcl) .[name].. 11/2  once daily 2)  Budeprion Sr 150 Mg Xr12h-tab (Bupropion hcl) .[name].. 1 two times a day 3)  Levothyroxine Sodium 150 Mcg Tabs (Levothyroxine sodium) .[name].. 1 once daily 4)  Alprazolam 0.5 Mg Tabs (Alprazolam) .[name].. 1 two times a day 5)  Lisinopril 20 Mg Tabs (Lisinopril) .[name].. 1  once daily 6)  Chantix Starting Month Pak 0.5 Mg X 11 & 1 Mg X 42 Tabs (Varenicline tartrate) .... As directed 7)  Chantix 1 Mg Tabs (Varenicline tartrate) .... One twice daily  Other Orders: Tobacco use cessation intermediate 3-10 minutes (16109)  Patient Instructions: 1)  Please schedule a follow-up appointment in 6 months. 2)  Limit your Sodium (Salt). 3)  It is important that you exercise regularly at least 20 minutes 5 times a week. If you develop chest pain, have severe difficulty breathing, or feel very tired , stop exercising immediately and seek medical attention. 4)  You need to lose weight. Consider a lower calorie diet and regular exercise.  5)  Check your Blood Pressure regularly. If it is above: 150/90 you should make an appointment. 6)  Tobacco is very bad for your health and your loved ones! You Should stop smoking!. Prescriptions: CHANTIX 1 MG TABS (VARENICLINE TARTRATE) one twice daily  #60 x 3   Entered and Authorized by:   Gordy Savers  MD   Signed by:   Gordy Savers  MD on 05/28/2009   Method used:   Print then Give to Patient   RxID:   [id number] CHANTIX STARTING MONTH PAK 0.5 MG X 11 & 1 MG X 42 TABS (VARENICLINE TARTRATE) as directed  #1 x 0   Entered and Authorized by:   Gordy Savers  MD   Signed by:   Gordy Savers  MD on 05/28/2009   Method used:   Print then Give to Patient   RxID:   [id number] LISINOPRIL 20 MG TABS (LISINOPRIL) 1 once daily  #90 x 6   Entered and Authorized by:   Gordy Savers  MD   Signed by:   Gordy Savers  MD on 05/28/2009   Method used:   Print then Give to Patient   RxID:   [id number] ALPRAZOLAM 0.5 MG TABS (ALPRAZOLAM) 1 two times a day  #180 x 6   Entered and Authorized by:   Gordy Savers  MD   Signed by:   Gordy Savers  MD on 05/28/2009   Method used:   Print then Give to Patient   RxID:   [id number] LEVOTHYROXINE SODIUM 150 MCG TABS (LEVOTHYROXINE  SODIUM) 1 once daily  #90 x 6   Entered and Authorized by:   Gordy Savers  MD   Signed by:   Gordy Savers  MD on 05/28/2009   Method used:   Print then Give to Patient   RxID:   [id number] BUDEPRION SR 150 MG XR12H-TAB (BUPROPION HCL) 1 two times a day  #180 x 6   Entered and Authorized by:   Gordy Savers  MD   Signed by:   Gordy Savers  MD on 05/28/2009   Method used:   Print then Give to Patient   RxID:   [id number] SERTRALINE HCL 100 MG TABS (SERTRALINE HCL) 11/2  once daily  #135 x 6   Entered and Authorized by:   Gordy Savers  MD   Signed by:  Gordy Savers  MD on 05/28/2009   Method used:   Print then Give to Patient   RxID:   [id number]

## 2010-05-25 NOTE — Assessment & Plan Note (Signed)
Summary: COUGH, CONGESTION // RS   Vital Signs:  Patient profile:   65 year old female Menstrual status:  postmenopausal Weight:      199 pounds Temp:     98.0 degrees F oral BP sitting:   100 / 70  (right arm) Cuff size:   regular  Vitals Entered By: Duard Brady LPN (January 11, 2010 12:49 PM) CC: c/o productive cough, head congestion , body aches x1wk Is Patient Diabetic? No   CC:  c/o productive cough, head congestion , and body aches x1wk.  History of Present Illness: 65 year old tobacco user, who presents with a one week history of head and chest congestion.  She has had a mildly productive cough that is been nonpurulent.  No fever or chills.  She denies any chest pain or shortness of breath.  Her husband has a similar illness.  She has treated hypertension and hypothyroidism  Allergies: 1)  ! Astramorph (Morphine Sulfate)  Past History:  Past Medical History: Reviewed history from 12/25/2009 and no changes required. Hypertension Osteopenia obesity tobacco abuse Hypothyroidism Depression possible mild restless leg syndrome  Past Surgical History: Reviewed history from 08/26/2008 and no changes required. gastric bypass surgery in November 2003 surgery for fracture, right arm, October 2006 D&Cs x 2 C-section 1980 gravida 3, para 3, abortus zero negative stress echocardiogram July 2009 colonoscopy due Summer 2010 ( Dr. Loreta Ave)  Review of Systems       The patient complains of anorexia and prolonged cough.  The patient denies fever, weight loss, weight gain, vision loss, decreased hearing, hoarseness, chest pain, syncope, dyspnea on exertion, peripheral edema, headaches, hemoptysis, abdominal pain, melena, hematochezia, severe indigestion/heartburn, hematuria, incontinence, genital sores, muscle weakness, suspicious skin lesions, transient blindness, difficulty walking, depression, unusual weight change, abnormal bleeding, enlarged lymph nodes, angioedema, and  breast masses.    Physical Exam  General:  Well-developed,well-nourished,in no acute distress; alert,appropriate and cooperative throughout examination Head:  Normocephalic and atraumatic without obvious abnormalities. No apparent alopecia or balding. Eyes:  No corneal or conjunctival inflammation noted. EOMI. Perrla. Funduscopic exam benign, without hemorrhages, exudates or papilledema. Vision grossly normal. Ears:  External ear exam shows no significant lesions or deformities.  Otoscopic examination reveals clear canals, tympanic membranes are intact bilaterally without bulging, retraction, inflammation or discharge. Hearing is grossly normal bilaterally. Mouth:  Oral mucosa and oropharynx without lesions or exudates.  Teeth in good repair. Neck:  No deformities, masses, or tenderness noted. Lungs:  Normal respiratory effort, chest expands symmetrically. Lungs are clear to auscultation, no crackles or wheezes. Heart:  Normal rate and regular rhythm. S1 and S2 normal without gallop, murmur, click, rub or other extra sounds. Abdomen:  Bowel sounds positive,abdomen soft and non-tender without masses, organomegaly or hernias noted.   Impression & Recommendations:  Problem # 1:  URI (ICD-465.9)  Her updated medication list for this problem includes:    Hydrocodone-homatropine 5-1.5 Mg/67ml Syrp (Hydrocodone-homatropine) ..... One tsp every 6 hours as needed  Her updated medication list for this problem includes:    Hydrocodone-homatropine 5-1.5 Mg/95ml Syrp (Hydrocodone-homatropine) ..... One tsp every 6 hours as needed  Problem # 2:  TOBACCO USER (ICD-305.1)  Her updated medication list for this problem includes:    Chantix Starting Month Pak 0.5 Mg X 11 & 1 Mg X 42 Tabs (Varenicline tartrate) .[name]... As directed    Chantix 1 Mg Tabs (Varenicline tartrate) ..... One twice daily  Her updated medication list for this problem includes:    Chantix Starting Month  Pak 0.5 Mg X 11 & 1 Mg X 42 Tabs  (Varenicline tartrate) .[name]... As directed    Chantix 1 Mg Tabs (Varenicline tartrate) ..... One twice daily  Complete Medication List: 1)  Sertraline Hcl 100 Mg Tabs (Sertraline hcl) .[name].. 11/2  once daily 2)  Budeprion Sr 150 Mg Xr12h-tab (Bupropion hcl) .[name].. 1 two times a day 3)  Levothyroxine Sodium 150 Mcg Tabs (Levothyroxine sodium) .[name].. 1 once daily 4)  Alprazolam 0.5 Mg Tabs (Alprazolam) .[name].. 1 two times a day 5)  Lisinopril 20 Mg Tabs (Lisinopril) .[name].. 1 once daily 6)  Chantix Starting Month Pak 0.5 Mg X 11 & 1 Mg X 42 Tabs (Varenicline tartrate) .... As directed 7)  Chantix 1 Mg Tabs (Varenicline tartrate) .... One twice daily 8)  Hydrocodone-homatropine 5-1.5 Mg/20ml Syrp (Hydrocodone-homatropine) .... One tsp every 6 hours as needed  Patient Instructions: 1)  Get plenty of rest, drink lots of clear liquids, and use Tylenol or Ibuprofen for fever and comfort. Return in 7-10 days if you're not better:sooner if you're feeling worse. Prescriptions: HYDROCODONE-HOMATROPINE 5-1.5 MG/5ML SYRP (HYDROCODONE-HOMATROPINE) one tsp every 6 hours as needed  #6 oz x 0   Entered and Authorized by:   Gordy Savers  MD   Signed by:   Gordy Savers  MD on 01/11/2010   Method used:   Print then Give to Patient   RxID:   [id number]

## 2010-05-27 NOTE — Assessment & Plan Note (Signed)
Summary: CPX/NJR--rm 7   Vital Signs:  Patient profile:   65 year old female Menstrual status:  postmenopausal Height:      63.75 inches Weight:      208 pounds BMI:     36.11 Temp:     97.9 degrees F oral Pulse rate:   72 / minute Pulse rhythm:   regular Resp:     18 per minute BP sitting:   100 / 60  (left arm) Cuff size:   large  Vitals Entered By: Mervin Kung CMA Duncan Dull) (May 07, 2010 9:21 AM) CC: Pt her for fasting physical. Is Patient Diabetic? No Pain Assessment Patient in pain? no      Comments Pt agrees all med doses and directions are correct. Nicki Guadalajara Fergerson CMA Duncan Dull)  May 07, 2010 9:36 AM    CC:  Pt her for fasting physical..  History of Present Illness: 28 -year-old patient who is seen today for a health maintenance examination.  She discontinued tobacco use 5 weeks ago.  She has treated hypertension, hypothyroidism, and also a history of osteopenia.  She is doing quite well without concerns or complaints today.  Laboratory studies were reviewed.  It revealed a suppressed TSH  Preventive Screening-Counseling & Management  Alcohol-Tobacco     Smoking Status: quit     Smoking Cessation Counseling: yes     Year Quit: 04-03-11  Allergies: 1)  ! Astramorph (Morphine Sulfate)  Past History:  Past Medical History: Reviewed history from 12/25/2009 and no changes required. Hypertension Osteopenia obesity tobacco abuse Hypothyroidism Depression possible mild restless leg syndrome  Past Surgical History: Reviewed history from 08/26/2008 and no changes required. gastric bypass surgery in November 2003 surgery for fracture, right arm, October 2006 D&Cs x 2 C-section 1980 gravida 3, para 3, abortus zero negative stress echocardiogram July 2009 colonoscopy due Summer 2010 ( Dr. Loreta Ave)  Family History: Reviewed history from 11/26/2008 and no changes required.  father died age 58, cornary  artery disease, congestive heart failure mother died  age 74.  History colon cancer;  dementia one brother, status post CABG two sisters are well her sister history of coronary artery disease, status post pacemaker insertion  Social History: Reviewed history from 05/28/2009 and no changes required. Married Current Smoker- d/c tobacco 03-30-2010 Regular exercise-yes 3 adult children, 7 grandchildren work for the [employer]-retired March 2011 Smoking Status:  quit  Review of Systems       The patient complains of weight gain.  The patient denies anorexia, fever, weight loss, vision loss, decreased hearing, hoarseness, chest pain, syncope, dyspnea on exertion, peripheral edema, prolonged cough, headaches, hemoptysis, abdominal pain, melena, hematochezia, severe indigestion/heartburn, hematuria, incontinence, genital sores, muscle weakness, suspicious skin lesions, transient blindness, difficulty walking, depression, unusual weight change, abnormal bleeding, enlarged lymph nodes, angioedema, and breast masses.    Physical Exam  General:  overweight-appearing.  130/80overweight-appearing.   Head:  Normocephalic and atraumatic without obvious abnormalities. No apparent alopecia or balding. Eyes:  No corneal or conjunctival inflammation noted. EOMI. Perrla. Funduscopic exam benign, without hemorrhages, exudates or papilledema. Vision grossly normal. Ears:  External ear exam shows no significant lesions or deformities.  Otoscopic examination reveals clear canals, tympanic membranes are intact bilaterally without bulging, retraction, inflammation or discharge. Hearing is grossly normal bilaterally. Nose:  External nasal examination shows no deformity or inflammation. Nasal mucosa are pink and moist without lesions or exudates. Mouth:  Oral mucosa and oropharynx without lesions or exudates.  Teeth in good repair. Neck:  No deformities, masses, or tenderness noted. Chest Wall:  No deformities, masses, or tenderness noted. Breasts:  No mass,  nodules, thickening, tenderness, bulging, retraction, inflamation, nipple discharge or skin changes noted.   Lungs:  Normal respiratory effort, chest expands symmetrically. Lungs are clear to auscultation, no crackles or wheezes. Heart:  Normal rate and regular rhythm. S1 and S2 normal without gallop, murmur, click, rub or other extra sounds. Abdomen:  Bowel sounds positive,abdomen soft and non-tender without masses, organomegaly or hernias noted. Genitalia:  Normal introitus for age, no external lesions, no vaginal discharge, mucosa pink and moist, no vaginal or cervical lesions, no vaginal atrophy, no friaility or hemorrhage, normal uterus size and position, no adnexal masses or tenderness Msk:  No deformity or scoliosis noted of thoracic or lumbar spine.   Pulses:  R and L carotid,radial,femoral,dorsalis pedis and posterior tibial pulses are full and equal bilaterally Extremities:  No clubbing, cyanosis, edema, or deformity noted with normal full range of motion of all joints.   Neurologic:  No cranial nerve deficits noted. Station and gait are normal. Plantar reflexes are down-going bilaterally. DTRs are symmetrical throughout. Sensory, motor and coordinative functions appear intact. hyperreflexia Skin:  Intact without suspicious lesions or rashes Cervical Nodes:  No lymphadenopathy noted Axillary Nodes:  No palpable lymphadenopathy Inguinal Nodes:  No significant adenopathy Psych:  Cognition and judgment appear intact. Alert and cooperative with normal attention span and concentration. No apparent delusions, illusions, hallucinations   Impression & Recommendations:  Problem # 1:  PREVENTIVE HEALTH CARE (ICD-V70.0)  Orders: EKG w/ Interpretation (93000) Gastroenterology Referral (GI)  Problem # 2:  HYPOTHYROIDISM (ICD-244.9)  The following medications were removed from the medication list:    Levothyroxine Sodium 150 Mcg Tabs (Levothyroxine sodium) .[name]... 1 once daily Her updated  medication list for this problem includes:    Levothyroxine Sodium 125 Mcg Tabs (Levothyroxine sodium) ..... One daily TSH is depressed, with decreased, levothyroxine dose  The following medications were removed from the medication list:    Levothyroxine Sodium 150 Mcg Tabs (Levothyroxine sodium) .[name]... 1 once daily Her updated medication list for this problem includes:    Levothyroxine Sodium 125 Mcg Tabs (Levothyroxine sodium) ..... One daily  Problem # 3:  DEPRESSION (ICD-311)  Her updated medication list for this problem includes:    Sertraline Hcl 100 Mg Tabs (Sertraline hcl) .[name]... 11/2  once daily    Budeprion Sr 150 Mg Xr12h-tab (Bupropion hcl) .[name]... 1 two times a day    Alprazolam 0.5 Mg Tabs (Alprazolam) .[name]... 1 two times a day stable  Her updated medication list for this problem includes:    Sertraline Hcl 100 Mg Tabs (Sertraline hcl) .[name]... 11/2  once daily    Budeprion Sr 150 Mg Xr12h-tab (Bupropion hcl) .[name]... 1 two times a day    Alprazolam 0.5 Mg Tabs (Alprazolam) .[name]... 1 two times a day  Problem # 4:  HYPERTENSION (ICD-401.9)  Her updated medication list for this problem includes:    Lisinopril 20 Mg Tabs (Lisinopril) .[name]... 1 once daily  Her updated medication list for this problem includes:    Lisinopril 20 Mg Tabs (Lisinopril) .[name]... 1 once daily  Complete Medication List: 1)  Sertraline Hcl 100 Mg Tabs (Sertraline hcl) .[name].. 11/2  once daily 2)  Budeprion Sr 150 Mg Xr12h-tab (Bupropion hcl) .[name].. 1 two times a day 3)  Alprazolam 0.5 Mg Tabs (Alprazolam) .[name].. 1 two times a day 4)  Lisinopril 20 Mg Tabs (Lisinopril) .[name].. 1 once daily 5)  Chantix Starting Month Pak 0.5 Mg X 11 & 1 Mg X 42 Tabs (Varenicline tartrate) .... As directed 6)  Chantix 1 Mg Tabs (Varenicline tartrate) .... One twice daily 7)  Levothyroxine Sodium 125 Mcg Tabs (Levothyroxine sodium) .... One daily  Other Orders: Admin 1st Vaccine (54098) Flu Vaccine 42yrs + (11914)  Patient  Instructions: 1)  Please schedule a follow-up appointment in 6 months. 2)  Limit your Sodium (Salt). 3)  It is important that you exercise regularly at least 20 minutes 5 times a week. If you develop chest pain, have severe difficulty breathing, or feel very tired , stop exercising immediately and seek medical attention. 4)  You need to lose weight. Consider a lower calorie diet and regular exercise.  5)  Schedule a colonoscopy/sigmoidoscopy to help detect colon cancer. 6)  Take calcium +Vitamin D daily. Prescriptions: LEVOTHYROXINE SODIUM 125 MCG TABS (LEVOTHYROXINE SODIUM) one daily  #90 x 6   Entered and Authorized by:   Gordy Savers  MD   Signed by:   Gordy Savers  MD on 05/07/2010   Method used:   Print then Give to Patient   RxID:   [id number] LISINOPRIL 20 MG TABS (LISINOPRIL) 1 once daily  #90 Tablet x 2   Entered and Authorized by:   Gordy Savers  MD   Signed by:   Gordy Savers  MD on 05/07/2010   Method used:   Print then Give to Patient   RxID:   [id number] ALPRAZOLAM 0.5 MG TABS (ALPRAZOLAM) 1 two times a day  #180 x 2   Entered and Authorized by:   Gordy Savers  MD   Signed by:   Gordy Savers  MD on 05/07/2010   Method used:   Print then Give to Patient   RxID:   [id number] BUDEPRION SR 150 MG XR12H-TAB (BUPROPION HCL) 1 two times a day  #90 Tablet x 3   Entered and Authorized by:   Gordy Savers  MD   Signed by:   Gordy Savers  MD on 05/07/2010   Method used:   Print then Give to Patient   RxID:   [id number] SERTRALINE HCL 100 MG TABS (SERTRALINE HCL) 11/2  once daily  #90 Tablet x 4   Entered and Authorized by:   Gordy Savers  MD   Signed by:   Gordy Savers  MD on 05/07/2010   Method used:   Print then Give to Patient   RxID:   [id number]    Orders Added: 1)  EKG w/ Interpretation [93000] 2)  Admin 1st Vaccine [90471] 3)  Flu Vaccine 20yrs + [32355] 4)  Est. Patient  40-64 years [73220] 5)  Gastroenterology Referral [GI]    Current Allergies (reviewed today): ! ASTRAMORPH (MORPHINE SULFATE)   Flu Vaccine Consent Questions     Do you have a history of severe allergic reactions to this vaccine? no    Any prior history of allergic reactions to egg and/or gelatin? no    Do you have a sensitivity to the preservative Thimersol? no    Do you have a past history of Guillan-Barre Syndrome? no    Do you currently have an acute febrile illness? no    Have you ever had a severe reaction to latex? no    Vaccine information given and explained to patient? yes    Are you currently pregnant? no    Lot Number:[id number]   Exp Date:10/23/2010  Site Given Right Deltoid IM. Nicki Guadalajara Fergerson CMA Duncan Dull)  May 07, 2010 9:51 AM

## 2010-07-08 ENCOUNTER — Other Ambulatory Visit: Payer: Self-pay | Admitting: Gastroenterology

## 2010-07-08 DIAGNOSIS — K639 Disease of intestine, unspecified: Secondary | ICD-10-CM

## 2010-07-12 ENCOUNTER — Ambulatory Visit
Admission: RE | Admit: 2010-07-12 | Discharge: 2010-07-12 | Disposition: A | Discharge status: Home / Self Care | Payer: BC Managed Care – PPO | Source: Ambulatory Visit | Attending: Gastroenterology | Admitting: Gastroenterology

## 2010-07-12 DIAGNOSIS — K639 Disease of intestine, unspecified: Secondary | ICD-10-CM

## 2010-07-12 MED ORDER — IOHEXOL 300 MG/ML  SOLN
125.0000 mL | Freq: Once | INTRAMUSCULAR | Status: AC | PRN
  Administered 2010-07-12: 125 mL via INTRAVENOUS

## 2010-08-24 ENCOUNTER — Encounter: Payer: Self-pay | Admitting: Internal Medicine

## 2010-09-10 NOTE — Op Note (Signed)
   NAME:  Kendra Ball, Kendra Ball                      ACCOUNT NO.:  [id number]   MEDICAL RECORD NO.:  [id number]                   PATIENT TYPE:  INP   LOCATION:  0006                                 FACILITY:  [facility]   PHYSICIAN:  Sandria Bales. Ezzard Standing, M.D.               DATE OF BIRTH:  1946/01/10   DATE OF PROCEDURE:  03/13/2002  DATE OF DISCHARGE:                                 OPERATIVE REPORT   PREOPERATIVE DIAGNOSES:  1. Morbid obesity.  2. Gastrojejunal anastomosis.   POSTOPERATIVE DIAGNOSES:  1. Morbid obesity.  2. Gastrojejunal anastomosis.   PROCEDURE:  Esophagogastroscopy.   SURGEON:  Sandria Bales. Ezzard Standing, M.D.   ANESTHESIA:  General.   INDICATIONS:  The patient is a 65 year old white female who is morbidly  obese who is undergoing a Roux-en-Y gastrojejunostomy by Dr. Jaclynn Guarneri.  The patient's upper endoscope is for testing the patency of her anastomosis  and checking for leaks.   DESCRIPTION OF PROCEDURE:  The Roux-en-Y gastrojejunostomy was completed  laparoscopically with Dr. Johna Sheriff looking at the patient from inside the  abdomen.  I then passed an Olympus endoscope without difficulty down the  back of the throat, down the esophagus to the GE junction.  I was able to  visualize the anastomosis which was widely patent at 2 to 2.5 cm in size.  I  was able to visualize the gastric staple line which was patent.  There was  no bleeding.  The anastomosis was estimated to be about 3 cm beyond the GE  junction.  The GE junction was noted to be at 39-40 cm.  The distal  esophagus and the rest of the esophagus were otherwise unremarkable.  There  is a little bit of bruising on the posterior wall of the esophagus but  everything else was unremarkable.  The patient tolerated this portion of the  procedure well and left the hospital.                                               Sandria Bales. Ezzard Standing, M.D.    DHN/MEDQ  D:  03/13/2002  T:  03/13/2002  Job:  [id number]   cc:   Lorne Skeens. Hoxworth, M.D.  [address], [address]  [place]  [place] [address]  Fax: [phone]

## 2010-09-10 NOTE — Op Note (Signed)
NAME:  Kendra Ball, Kendra Ball                      ACCOUNT NO.:  [id number]   MEDICAL RECORD NO.:  [id number]                   PATIENT TYPE:  INP   LOCATION:  0162                                 FACILITY:  [facility]   PHYSICIAN:  Sharlet Salina T. Hoxworth, M.D.          DATE OF BIRTH:  12-May-1945   DATE OF PROCEDURE:  03/13/2002  DATE OF DISCHARGE:                                 OPERATIVE REPORT   PREOPERATIVE DIAGNOSES:  1. Morbid obesity.  2. Hypertension.  3. Hyperglycemia.   POSTOPERATIVE DIAGNOSES:  1. Morbid obesity.  2. Hypertension.  3. Hyperglycemia.   PROCEDURE:  Roux-en-Y gastric bypass.   SURGEON:  Lorne Skeens. Hoxworth, M.D.   ASSISTANT:  Vikki Ports, M.D.   ANESTHESIA:  General.   BRIEF HISTORY:  The patient is a 65 year old white female with morbid  obesity, which has failed to respond to nonsurgical management.  She has  developed comorbidities of hypertension, hyperglycemia, and osteoarthritis.  After an extensive workup and discussion, we have elected to proceed with  Roux-en-Y laparoscopic gastric bypass for her morbid obesity.  The nature of  the procedure and multiple risks, including but not limited to anastomotic  leak, pulmonary embolus, hernia, and cardiorespiratory complications up to  and including death, were discussed and understood preoperatively.  She is  now brought to the operating room for this procedure.   DESCRIPTION OF PROCEDURE:  The patient had undergone mechanical and  antibiotic bowel prep at home prior to the procedure.  She was given 5000  units of heparin subcu preoperatively as well as 2 g of Cefotetan.  She was  brought to the operating room, placed in the supine position on the  operating table, and general endotracheal anesthesia was induced.  PAS were  in place.  The abdomen was widely sterilely prepped and draped.  A 1 cm  incision was made in the midclavicular line at the left costal margin and  dissection was carried  down to the fascia, which was grasped with a Kocher  and elevated and the Veress needle inserted and pneumoperitoneum  established.  Using the OptiView trocar under direct vision, a 12 mm trocar  was placed into the peritoneal cavity.  Under direct vision another 12 mm  trocar was placed above the umbilicus in the midline, another 12 mm trocar  in the right upper quadrant just to the right of the midline through the  falciform ligament, and a 5 mm trocar in the left lateral abdomen.  Video  laparoscopy was unremarkable.  The omentum and transverse colon were  elevated and the ligament of Treitz clearly identified.  Thirty centimeters  was marched down from the ligament of Treitz along the small bowel to an  area of mobile and reasonably thin mesentery.  Using the Endo-GIA vascular  stapler, the small bowel was divided at this point.  The mesentery was  further divided with two firings of the vascular 40 mm stapler,  each time  confirming good pulsatile blood flow to the ends of the bowel limb after  clamping the stapler and before firing.  The afferent limb was placed to the  patient's right.  A Penrose drain was sutured to the end of the Roux limb.  A 120 cm Roux limb was then carefully measured.  At this point a side-to-  side anastomosis was created between the afferent and Roux limb with a  single firing of the vascular Endo-GIA stapler.  The common enterotomy was  closed with running 2-0 Vicryl, begun at either end of the enterotomy and  tied centrally.  Following this the mesentery of the afferent limb was  sutured down to the retroperitoneum with a running 2-0 silk suture, carrying  the suture up onto the afferent limb itself and over to the anastomosis of  the efferent limb, closing the posterior mesenteric defect.  At this point  the ligament of Treitz was again exposed and the transverse mesocolon was  found to have a thin area a couple of centimeters superior to the patient's   left.  This was dissected with the Harmonic scalpel and the lesser sac  easily entered, the posterior stomach wall identified, and the retrogastric  space was free of any significant adhesions.  Being careful to keep the  mesentery of the Roux limb oriented, the Penrose drain and Roux limb were  passed retrocolic into the retrogastric space without difficulty.  Following  this the patient was placed in Trendelenburg.  Through a 5 mm stab wound in  the subxiphoid area, the [facility] liver retractor was placed and the left  lobe of the liver retracted superiorly with excellent exposure of the hiatus  and upper stomach.  Initially the angle of His was mobilized, dividing the  peritoneum along the angle of His, freeing the EG junction from the left  crus and freeing the most proximal fundus from peritoneal attachments to the  spleen.  Following this a portion of proximal stomach along the lesser curve  was chosen about 4 cm distal from the EG junction to begin creating the  pouch.  The lesser curve of the stomach was dissected posteriorly using the  Harmonic scalpel and the retrogastric space entered, again relatively  easily, and dissection carried along the posterior of the stomach up toward  the angle of His.  A small about 4 cm in length gastric pouch was created  with four firings of the blue 40 mm Endo-GIA stapler, initially starting at  right angles to the lesser curve and then angling straight up to the angle  of His, excluding the fundus, until the stomach was completely divided.  The  last firing was done with an Ewald tube through the EG junction to prevent  narrowing.  The Roux limb was then able to be brought up easily in the  retrogastric position and was positioned adjacent to the gastric pouch.  Initially a posterior running suture line of 2-0 Vicryl was used to suture the Roux limb to the gastric pouch.  Enterotomies were then made in the  gastric pouch and Roux limb and with a  single firing of the Endo-GIA blue  stapler, anastomosis created between the gastric pouch and Roux limb  measuring about 3 cm.  Enterotomy was then closed in two layers with a full-  thickness running 2-0 Vicryl layer, followed by full-thickness running  seromuscular 2-0 Vicryl suture.  The final layer was put in with the Ewald  tube through  the anastomosis to prevent any stricture.  Following this the  ligament of Treitz was again exposed and the mesenteric defect at the base  of the transverse colon was closed with a triple stitch incorporating the  mesentery, the Roux limb, and the small bowel from the ligament of Treitz.  Additional fixation sutures of 2-0 silk were placed 3 cm distal between the  afferent and Roux limbs and also tacking the Roux limb to the  retroperitoneum on the left side.  Then while clamping the Roux limb at the  base of the mesentery, upper endoscopy was performed, which showed a small  gastric pouch with about a 2.5 cm patent anastomosis.  With the pouch  tightly distended with air under saline, there was no bubbling evidence of  leak.  The pouch was then decompressed, the endoscope removed.  A closed-  suction drain was brought out through one of the lateral trocar sites and  left adjacent to the anastomosis.  Under direct vision trocar sites were  closed with the Endo Close using 0 Vicryl sutures and all CO2 evacuated from  the peritoneal cavity.  Skin incisions were closed with staples.  Sponge,  needle, and instrument counts were correct.  Dry sterile dressings were  applied.  The patient was taken to recovery in good condition.                                               Lorne Skeens. Hoxworth, M.D.    Tory Emerald  D:  03/13/2002  T:  03/14/2002  Job:  [id number]

## 2010-09-10 NOTE — Op Note (Signed)
NAME:  Kendra Ball, Kendra Ball                      ACCOUNT NO.:  [id number]   MEDICAL RECORD NO.:  [id number]                   PATIENT TYPE:  AMB   LOCATION:  ENDO                                 FACILITY:  [facility]   PHYSICIAN:  Anselmo Rod, M.D.               DATE OF BIRTH:  Jun 24, 1945   DATE OF PROCEDURE:  12/08/2003  DATE OF DISCHARGE:                                 OPERATIVE REPORT   PROCEDURE:  Colonoscopy with cold biopsies x2.   ENDOSCOPIST:  Anselmo Rod, M.D.   INSTRUMENT:  Olympus video colonoscope.   INDICATIONS FOR PROCEDURE:  A 65 year old white female with a family history  of colon cancer in her mother, underwent screening colonoscopy to rule out  colonic polyps, masses, etc.   PRE-PROCEDURE PREPARATION:  Informed consent was procured from the patient.  Patient fasted for 8 hours prior to the procedure and prepped with a bottle  of magnesium citrate and a gallon of GoLYTELY the night prior to the  procedure.  Pre-procedure physical:  Patient had stable vital signs, neck  supple, chest clear to auscultation, S1/S2 regular, abdomen soft with normal  bowel sounds.   DESCRIPTION OF PROCEDURE:  The patient was placed in the left lateral  decubitus position, sedated with 50 mg of Demerol and 4 mg of Versed in  slow, incremental doses.  Once the patient was adequately sedated and  maintained on low flow oxygen, continuous cardiac monitoring; the Olympus  video colonoscope was advanced from the rectum to the cecum.  The patient had a difficult exam. There was a large amount of residual stool  in the colon.  Multiple washings were done.  The appendiceal orifice and the  ileocecal valve were clearly visualized and photographed.  A small, sessile  polyp was biopsied from the mid-transverse colon.  Small lesions could have  been missed secondary to relatively poor prep. Small internal hemorrhoids  were seen on retroflexion of the rectum.   IMPRESSION:  1. Small, sessile  polyp, biopsied from mid-transverse colon.  2. Small, internal hemorrhoids.  3. Large amount of residual stool in the colon, small lesions could have     been missed. A submucosal, yellowish lesion compatible with a lipoma was     noted at 70 cm.   RECOMMENDATIONS:  1. Continue a high fiber diet with liberal fluids intake.  2. Outpatient follow up in the next 2 weeks for further recommendations.                                               Anselmo Rod, M.D.    JNM/MEDQ  D:  12/08/2003  T:  12/08/2003  Job:  [id number]   cc:   Teena Irani. Arlyce Dice, M.D.  [address]  [place]  [place] [address]  Fax:  [phone] 

## 2010-09-10 NOTE — Discharge Summary (Signed)
NAME:  Kendra Ball, Kendra Ball                      ACCOUNT NO.:  [id number]   MEDICAL RECORD NO.:  [id number]                   PATIENT TYPE:  INP   LOCATION:  0471                                 FACILITY:  [facility]   PHYSICIAN:  Sharlet Salina T. Hoxworth, M.D.          DATE OF BIRTH:  07-31-1945   DATE OF ADMISSION:  03/13/2002  DATE OF DISCHARGE:  03/16/2002                                 DISCHARGE SUMMARY   DISCHARGE DIAGNOSES:  1. Morbid obesity.  2. Hypertension.  3. Hyperglycemia.  4. Arthritis.   OPERATIONS/PROCEDURES:  Roux-en-Y laparoscopic gastric bypass on March 13, 2002.   HISTORY OF PRESENT ILLNESS:  The patient is a 65 year old white female with  a long history of significant obesity.  She has failed numerous nonsurgical  options for weight loss, detailed in her history of present illness.  Her  BMI is 47.6.  She has comorbidities of arthritis and significant joint pain  as well as borderline hypertension and hyperglycemia.  After a thorough  preoperative evaluation, extensive discussion of options, the patient is  admitted on the morning of the procedure for a Roux-en-Y laparoscopic  gastric bypass.   PAST SURGICAL HISTORY:  1. Tonsillectomy.  2. D&C.   PAST MEDICAL HISTORY:  1. Hypothyroidism.  2. Arthritis.  3. Depression.  4. Anxiety.  5. Followed for borderline hypertension.  6. Hyperglycemia.   MEDICATIONS:  1. Synthroid 150 mcg daily.  2. Celebrex 200 mg daily.  3. Zoloft 100 mg daily.  4. Alprazolam 0.5 p.r.n.   ALLERGIES:  MORPHINE.   SOCIAL HISTORY, FAMILY HISTORY, REVIEW OF SYSTEMS:  See the admission  history and physical.   PERTINENT PHYSICAL EXAMINATION:  She is 5 feet 5 inches, 285 pounds, BMI  47.56.  Physical examination is unremarkable except for her obesity.   HOSPITAL COURSE:  On the morning of her admission the patient underwent an  uneventful laparoscopic Roux-en-Y gastric bypass.  Her postoperative course  was quite smooth.   She was followed by the hospitalist for her depression  and hypothyroidism.  She was begun on a liquid diet the first postoperative  day after a negative Gastrografin swallow.  Dopplers of lower extremities  was performed on the first postoperative day and were negative.  She was  ambulated immediately after surgery.  Her diet was liberalized to increased  clear liquids, which she tolerated well.  She had no unusual JP drainage.  By March 16, 2002, she was tolerating her liquid diet without difficulty.  Her abdomen was benign, and she was ambulatory and without complaints.  She  was discharged home at this time.   DISCHARGE MEDICATIONS:  Are the same as admission.   FOLLOW-UP:  Follow-up is to be in my office in five days.  Lorne Skeens. Hoxworth, M.D.    Tory Emerald  D:  04/09/2002  T:  04/09/2002  Job:  [id number]

## 2010-09-17 ENCOUNTER — Other Ambulatory Visit: Payer: Self-pay

## 2010-09-17 MED ORDER — ALPRAZOLAM 0.5 MG PO TABS: 0.5000 mg | ORAL_TABLET | Freq: Two times a day (BID) | ORAL | Status: DC

## 2010-09-17 NOTE — Telephone Encounter (Signed)
[facility] 

## 2010-11-03 ENCOUNTER — Encounter: Payer: Self-pay | Admitting: Internal Medicine

## 2010-11-05 ENCOUNTER — Ambulatory Visit: Payer: Self-pay | Admitting: Internal Medicine

## 2010-12-06 ENCOUNTER — Other Ambulatory Visit: Payer: Self-pay | Admitting: Internal Medicine

## 2010-12-06 MED ORDER — VARENICLINE TARTRATE 1 MG PO TABS: 1.0000 mg | ORAL_TABLET | Freq: Two times a day (BID) | ORAL | Status: DC

## 2010-12-06 NOTE — Telephone Encounter (Signed)
Pt requesting refill on varenicline (CHANTIX) 1 MG tablet   [facility] [address]

## 2010-12-06 NOTE — Telephone Encounter (Signed)
[facility]

## 2011-02-10 ENCOUNTER — Other Ambulatory Visit: Payer: Self-pay | Admitting: Internal Medicine

## 2011-05-19 ENCOUNTER — Other Ambulatory Visit: Payer: Self-pay

## 2011-05-19 MED ORDER — BUPROPION HCL ER (SMOKING DET) 150 MG PO TB12: 150.0000 mg | ORAL_TABLET | Freq: Two times a day (BID) | ORAL | Status: DC

## 2011-05-19 NOTE — Telephone Encounter (Signed)
Rx sent to pharmacy electronically.   

## 2011-05-23 ENCOUNTER — Other Ambulatory Visit: Payer: Self-pay

## 2011-05-23 MED ORDER — ALPRAZOLAM 0.5 MG PO TABS: 0.5000 mg | ORAL_TABLET | Freq: Two times a day (BID) | ORAL | Status: DC

## 2011-05-23 NOTE — Telephone Encounter (Signed)
Rx request for alprazolam 0.5 mg.  Rx last filled 09/17/10 # 180 x 1 rf.  Pt last seen 05/07/10.  Pls advise.

## 2011-05-23 NOTE — Telephone Encounter (Signed)
Rx called in to pharmacy.  Pt is aware that a cpx is needed in 3-6 months.  Pt states she will call to schedule appt once she is around her schedule.

## 2011-05-23 NOTE — Telephone Encounter (Signed)
Okay to refill;  schedule annual physical in 3-6 months

## 2011-06-03 ENCOUNTER — Other Ambulatory Visit: Payer: Self-pay | Admitting: Internal Medicine

## 2011-08-03 ENCOUNTER — Other Ambulatory Visit: Payer: Self-pay | Admitting: Internal Medicine

## 2011-08-09 ENCOUNTER — Encounter: Payer: Self-pay | Admitting: Internal Medicine

## 2011-08-09 ENCOUNTER — Ambulatory Visit (INDEPENDENT_AMBULATORY_CARE_PROVIDER_SITE_OTHER): Payer: BC Managed Care – PPO | Admitting: Internal Medicine

## 2011-08-09 VITALS — BP 120/80 | Temp 98.2°F | Ht 63.75 in | Wt 218.0 lb

## 2011-08-09 DIAGNOSIS — F17200 Nicotine dependence, unspecified, uncomplicated: Secondary | ICD-10-CM

## 2011-08-09 DIAGNOSIS — F3289 Other specified depressive episodes: Secondary | ICD-10-CM

## 2011-08-09 DIAGNOSIS — Z0000 Encounter for general adult medical examination without abnormal findings: Secondary | ICD-10-CM

## 2011-08-09 DIAGNOSIS — F329 Major depressive disorder, single episode, unspecified: Secondary | ICD-10-CM

## 2011-08-09 DIAGNOSIS — E039 Hypothyroidism, unspecified: Secondary | ICD-10-CM

## 2011-08-09 DIAGNOSIS — I10 Essential (primary) hypertension: Secondary | ICD-10-CM

## 2011-08-09 LAB — CBC WITH DIFFERENTIAL/PLATELET
Basophils Absolute: 0.1 10*3/uL (ref 0.0–0.1)
Basophils Relative: 0.9 % (ref 0.0–3.0)
Eosinophils Absolute: 0.1 10*3/uL (ref 0.0–0.7)
Eosinophils Relative: 1.6 % (ref 0.0–5.0)
HCT: 44.3 % (ref 36.0–46.0)
Hemoglobin: 14.5 g/dL (ref 12.0–15.0)
Lymphocytes Relative: 33.8 % (ref 12.0–46.0)
Lymphs Abs: 2.5 10*3/uL (ref 0.7–4.0)
MCHC: 32.7 g/dL (ref 30.0–36.0)
MCV: 97.5 fl (ref 78.0–100.0)
Monocytes Absolute: 0.5 10*3/uL (ref 0.1–1.0)
Monocytes Relative: 6.6 % (ref 3.0–12.0)
Neutro Abs: 4.2 10*3/uL (ref 1.4–7.7)
Neutrophils Relative %: 57.1 % (ref 43.0–77.0)
Platelets: 321 10*3/uL (ref 150.0–400.0)
RBC: 4.54 Mil/uL (ref 3.87–5.11)
RDW: 13.2 % (ref 11.5–14.6)
WBC: 7.4 10*3/uL (ref 4.5–10.5)

## 2011-08-09 LAB — LIPID PANEL
Cholesterol: 228 mg/dL — ABNORMAL HIGH (ref 0–200)
HDL: 70 mg/dL (ref >39.00)
Total CHOL/HDL Ratio: 3
Triglycerides: 104 mg/dL (ref 0.0–149.0)
VLDL: 20.8 mg/dL (ref 0.0–40.0)

## 2011-08-09 LAB — COMPREHENSIVE METABOLIC PANEL
ALT: 22 U/L (ref 0–35)
AST: 25 U/L (ref 0–37)
Albumin: 4.2 g/dL (ref 3.5–5.2)
Alkaline Phosphatase: 63 U/L (ref 39–117)
BUN: 9 mg/dL (ref 6–23)
CO2: 28 mEq/L (ref 19–32)
Calcium: 9.3 mg/dL (ref 8.4–10.5)
Chloride: 104 mEq/L (ref 96–112)
Creatinine, Ser: 0.7 mg/dL (ref 0.4–1.2)
GFR: 87.76 mL/min (ref >60.00)
Glucose, Bld: 85 mg/dL (ref 70–99)
Potassium: 4.2 mEq/L (ref 3.5–5.1)
Sodium: 142 mEq/L (ref 135–145)
Total Bilirubin: 0.3 mg/dL (ref 0.3–1.2)
Total Protein: 7.4 g/dL (ref 6.0–8.3)

## 2011-08-09 LAB — LDL CHOLESTEROL, DIRECT: Direct LDL: 140.2 mg/dL

## 2011-08-09 LAB — TSH: TSH: 0.48 u[IU]/mL (ref 0.35–5.50)

## 2011-08-09 MED ORDER — LISINOPRIL 20 MG PO TABS: 20.0000 mg | ORAL_TABLET | Freq: Every day | ORAL | Status: DC

## 2011-08-09 MED ORDER — LEVOTHYROXINE SODIUM 125 MCG PO TABS: 125.0000 ug | ORAL_TABLET | Freq: Every day | ORAL | Status: DC

## 2011-08-09 MED ORDER — BUPROPION HCL ER (SMOKING DET) 150 MG PO TB12: 150.0000 mg | ORAL_TABLET | Freq: Two times a day (BID) | ORAL | Status: DC

## 2011-08-09 MED ORDER — SERTRALINE HCL 100 MG PO TABS: 100.0000 mg | ORAL_TABLET | Freq: Every day | ORAL | Status: DC

## 2011-08-09 MED ORDER — ALPRAZOLAM 0.5 MG PO TABS: 0.5000 mg | ORAL_TABLET | Freq: Two times a day (BID) | ORAL | Status: DC

## 2011-08-09 NOTE — Patient Instructions (Signed)

## 2011-08-09 NOTE — Progress Notes (Signed)
Subjective:    Patient ID: Kendra Ball, female    DOB: 11-16-1945, 66 y.o.   MRN: [id number]  HPI  66 year old patient who is seen today for a preventive health examination. She has treated hypertension which has been well-controlled. She has hypothyroidism which has been controlled on supplemental levothyroxine. Unfortunately she has resumed smoking. Last year she underwent a pelvic and had a normal Pap done at that time. She did have a colonoscopy performed last year that did reveal colonic polyps Family history is positive for colon cancer with her mother affected and also positive for premature coronary artery disease  Past Medical History  Diagnosis Date  . DEPRESSION 08/26/2008  . HYPERTENSION 08/26/2008  . HYPOTHYROIDISM 08/26/2008  . OSTEOPENIA 08/26/2008  . TOBACCO USER 12/25/2009  . Obesity   . Hx of colonic polyps     First noted on  colonoscopy 2012    History   Social History  . Marital Status: Married    Spouse Name: N/A    Number of Children: N/A  . Years of Education: N/A   Occupational History  . Not on file.   Social History Main Topics  . Smoking status: Current Everyday Smoker  . Smokeless tobacco: Not on file  . Alcohol Use:   . Drug Use:   . Sexually Active:    Other Topics Concern  . Not on file   Social History Narrative  . No narrative on file    Past Surgical History  Procedure Date  . Gastric bypass   . Cesarean section   . Dilation and curettage of uterus     x2  . Forearm fracture surgery     right    Family History  Problem Relation Age of Onset  . Cancer Mother     colon  . Dementia Mother   . Heart disease Father   . Heart disease Sister   . Heart disease Brother     Allergies  Allergen Reactions  . Morphine Sulfate     Current Outpatient Prescriptions on File Prior to Visit  Medication Sig Dispense Refill  . ALPRAZolam (XANAX) 0.5 MG tablet Take 1 tablet (0.5 mg total) by mouth 2 (two) times daily.  180 tablet  1  .  buPROPion (BUPROBAN) 150 MG 12 hr tablet Take 1 tablet (150 mg total) by mouth 2 (two) times daily.  90 tablet  0  . calcium carbonate 1250 MG capsule Take 1,250 mg by mouth daily.      . ferrous fumarate (HEMOCYTE - 106 MG FE) 325 (106 FE) MG TABS Take 1 tablet by mouth.      . levothyroxine (SYNTHROID, LEVOTHROID) 125 MCG tablet TAKE 1 TABLET EVERY DAY  60 tablet  0  . lisinopril (PRINIVIL,ZESTRIL) 20 MG tablet TAKE 1 TABLET EVERY DAY  60 tablet  0  . sertraline (ZOLOFT) 100 MG tablet TAKE 1 AND 1/2 TABLET EVERY DAY  60 tablet  0  . buPROPion (WELLBUTRIN SR) 150 MG 12 hr tablet Take 150 mg by mouth 2 (two) times daily.        . varenicline (CHANTIX) 1 MG tablet Take 1 tablet (1 mg total) by mouth 2 (two) times daily.  180 tablet  1    BP 120/80  Temp(Src) 98.2 F (36.8 C) (Oral)  Ht 5' 3.75" (1.619 m)  Wt 218 lb (98.884 kg)  BMI 37.71 kg/m2   1. Risk factors, based on past  M,S,F history- cardiovascular risk factors include hypertension  tobacco use and strong family history of premature heart disease. Family history is also positive for colon cancer in a first-degree relative  2.  Physical activities: Very sedentary. Has had a 10 pound weight gain over the past year  3.  Depression/mood: History depression which has been stable on therapy  4.  Hearing: No deficits  5.  ADL's: Independent in all aspects of daily living  6.  Fall risk: Low  7.  Home safety: No problems identified  8.  Height weight, and visual acuity; height and weight stable has had some mild blurred vision is scheduled for an eye exam tomorrow  9.  Counseling: Smoking cessation more regular exercise and weight loss all encouraged  10. Lab orders based on risk factors: Laboratory data including lipid panel and TSH will be reviewed  11. Referral : Not appropriate at this time  12. Care plan: Followup colonoscopy 4 years tobacco cessation encouraged  13.  Cognition-  alert and oriented with normal affect no  cognitive dysfunction      Review of Systems  Constitutional: Positive for unexpected weight change. Negative for fever, appetite change and fatigue.  HENT: Negative for hearing loss, ear pain, nosebleeds, congestion, sore throat, mouth sores, trouble swallowing, neck stiffness, dental problem, voice change, sinus pressure and tinnitus.   Eyes: Negative for photophobia, pain, redness and visual disturbance.  Respiratory: Negative for cough, chest tightness and shortness of breath.   Cardiovascular: Negative for chest pain, palpitations and leg swelling.  Gastrointestinal: Negative for nausea, vomiting, abdominal pain, diarrhea, constipation, blood in stool, abdominal distention and rectal pain.  Genitourinary: Negative for dysuria, urgency, frequency, hematuria, flank pain, vaginal bleeding, vaginal discharge, difficulty urinating, genital sores, vaginal pain, menstrual problem and pelvic pain.  Musculoskeletal: Negative for back pain and arthralgias.  Skin: Negative for rash.  Neurological: Negative for dizziness, syncope, speech difficulty, weakness, light-headedness, numbness and headaches.  Hematological: Negative for adenopathy. Does not bruise/bleed easily.  Psychiatric/Behavioral: Negative for suicidal ideas, behavioral problems, self-injury, dysphoric mood and agitation. The patient is not nervous/anxious.        Objective:   Physical Exam  Constitutional: She is oriented to person, place, and time. She appears well-developed and well-nourished.       Obese Weight 218 Blood pressure 118/78  HENT:  Head: Normocephalic and atraumatic.  Right Ear: External ear normal.  Left Ear: External ear normal.  Mouth/Throat: Oropharynx is clear and moist.  Eyes: Conjunctivae and EOM are normal.  Neck: Normal range of motion. Neck supple. No JVD present. No thyromegaly present.  Cardiovascular: Normal rate, regular rhythm, normal heart sounds and intact distal pulses.   No murmur  heard. Pulmonary/Chest: Effort normal and breath sounds normal. She has no wheezes. She has no rales.  Abdominal: Soft. Bowel sounds are normal. She exhibits no distension and no mass. There is no tenderness. There is no rebound and no guarding.  Musculoskeletal: Normal range of motion. She exhibits no edema and no tenderness.  Neurological: She is alert and oriented to person, place, and time. She has normal reflexes. No cranial nerve deficit. She exhibits normal muscle tone. Coordination normal.  Skin: Skin is warm and dry. No rash noted.  Psychiatric: She has a normal mood and affect. Her behavior is normal.          Assessment & Plan:   Preventive health exam Hypertension stable. We'll continue present regimen weight loss low salt diet exercise encouraged Exogenous obesity. Weight loss encouraged Hypothyroidism. We'll check a TSH  Family history premature coronary artery disease Family history: Cancer History of colonic polyps Depression stable  Recheck one year

## 2011-10-10 ENCOUNTER — Other Ambulatory Visit: Payer: Self-pay | Admitting: Internal Medicine

## 2011-11-15 ENCOUNTER — Telehealth: Payer: Self-pay | Admitting: Internal Medicine

## 2011-11-15 NOTE — Telephone Encounter (Signed)
[facility] and will call pt when ready for pick up

## 2011-11-15 NOTE — Telephone Encounter (Signed)
Pt requesting refill on ALPRAZolam (XANAX) 0.5 MG tablet  Pt states she has been trying to get this refilled for a week  [facility] [address]

## 2012-02-06 ENCOUNTER — Other Ambulatory Visit: Payer: Self-pay | Admitting: Internal Medicine

## 2012-05-21 ENCOUNTER — Other Ambulatory Visit: Payer: Self-pay | Admitting: Internal Medicine

## 2012-07-27 ENCOUNTER — Other Ambulatory Visit: Payer: Self-pay | Admitting: Internal Medicine

## 2012-08-09 ENCOUNTER — Ambulatory Visit (INDEPENDENT_AMBULATORY_CARE_PROVIDER_SITE_OTHER): Payer: Medicare Other | Admitting: Internal Medicine

## 2012-08-09 ENCOUNTER — Encounter: Payer: Self-pay | Admitting: Internal Medicine

## 2012-08-09 VITALS — BP 120/80 | HR 84 | Temp 98.2°F | Resp 18 | Ht 63.5 in | Wt 201.0 lb

## 2012-08-09 DIAGNOSIS — M899 Disorder of bone, unspecified: Secondary | ICD-10-CM

## 2012-08-09 DIAGNOSIS — I10 Essential (primary) hypertension: Secondary | ICD-10-CM

## 2012-08-09 DIAGNOSIS — F329 Major depressive disorder, single episode, unspecified: Secondary | ICD-10-CM

## 2012-08-09 DIAGNOSIS — E039 Hypothyroidism, unspecified: Secondary | ICD-10-CM

## 2012-08-09 DIAGNOSIS — M949 Disorder of cartilage, unspecified: Secondary | ICD-10-CM

## 2012-08-09 DIAGNOSIS — F3289 Other specified depressive episodes: Secondary | ICD-10-CM

## 2012-08-09 DIAGNOSIS — Z0000 Encounter for general adult medical examination without abnormal findings: Secondary | ICD-10-CM

## 2012-08-09 LAB — CBC WITH DIFFERENTIAL/PLATELET
Basophils Absolute: 0.1 10*3/uL (ref 0.0–0.1)
Basophils Relative: 0.8 % (ref 0.0–3.0)
Eosinophils Absolute: 0.1 10*3/uL (ref 0.0–0.7)
Eosinophils Relative: 1.3 % (ref 0.0–5.0)
HCT: 42.8 % (ref 36.0–46.0)
Hemoglobin: 14.2 g/dL (ref 12.0–15.0)
Lymphocytes Relative: 34 % (ref 12.0–46.0)
Lymphs Abs: 2.4 10*3/uL (ref 0.7–4.0)
MCHC: 33.2 g/dL (ref 30.0–36.0)
MCV: 97.7 fl (ref 78.0–100.0)
Monocytes Absolute: 0.4 10*3/uL (ref 0.1–1.0)
Monocytes Relative: 6.4 % (ref 3.0–12.0)
Neutro Abs: 4 10*3/uL (ref 1.4–7.7)
Neutrophils Relative %: 57.5 % (ref 43.0–77.0)
Platelets: 299 10*3/uL (ref 150.0–400.0)
RBC: 4.39 Mil/uL (ref 3.87–5.11)
RDW: 13.1 % (ref 11.5–14.6)
WBC: 7 10*3/uL (ref 4.5–10.5)

## 2012-08-09 LAB — COMPREHENSIVE METABOLIC PANEL
ALT: 30 U/L (ref 0–35)
AST: 29 U/L (ref 0–37)
Albumin: 4 g/dL (ref 3.5–5.2)
Alkaline Phosphatase: 55 U/L (ref 39–117)
BUN: 10 mg/dL (ref 6–23)
CO2: 27 mEq/L (ref 19–32)
Calcium: 9.3 mg/dL (ref 8.4–10.5)
Chloride: 105 mEq/L (ref 96–112)
Creatinine, Ser: 0.7 mg/dL (ref 0.4–1.2)
GFR: 84.72 mL/min (ref >60.00)
Glucose, Bld: 87 mg/dL (ref 70–99)
Potassium: 4 mEq/L (ref 3.5–5.1)
Sodium: 143 mEq/L (ref 135–145)
Total Bilirubin: 0.5 mg/dL (ref 0.3–1.2)
Total Protein: 7.3 g/dL (ref 6.0–8.3)

## 2012-08-09 LAB — LIPID PANEL
Cholesterol: 226 mg/dL — ABNORMAL HIGH (ref 0–200)
HDL: 64.7 mg/dL (ref >39.00)
Total CHOL/HDL Ratio: 3
Triglycerides: 118 mg/dL (ref 0.0–149.0)
VLDL: 23.6 mg/dL (ref 0.0–40.0)

## 2012-08-09 LAB — LDL CHOLESTEROL, DIRECT: Direct LDL: 133.5 mg/dL

## 2012-08-09 LAB — TSH: TSH: 0.27 u[IU]/mL — ABNORMAL LOW (ref 0.35–5.50)

## 2012-08-09 MED ORDER — LEVOTHYROXINE SODIUM 125 MCG PO TABS: 125.0000 ug | ORAL_TABLET | Freq: Every day | ORAL | Status: DC

## 2012-08-09 MED ORDER — SERTRALINE HCL 100 MG PO TABS: 100.0000 mg | ORAL_TABLET | Freq: Every day | ORAL | Status: DC

## 2012-08-09 MED ORDER — LISINOPRIL 20 MG PO TABS: 20.0000 mg | ORAL_TABLET | Freq: Every day | ORAL | Status: DC

## 2012-08-09 MED ORDER — BUPROPION HCL ER (SR) 150 MG PO TB12: 150.0000 mg | ORAL_TABLET | Freq: Two times a day (BID) | ORAL | Status: DC

## 2012-08-09 MED ORDER — ALPRAZOLAM 0.5 MG PO TABS: ORAL_TABLET | ORAL | Status: DC

## 2012-08-09 NOTE — Progress Notes (Signed)
Patient ID: ZYARA RILING, female   DOB: January 21, 1946, 67 y.o.   MRN: [id number]  Subjective:    Patient ID: TERRIYAH WESTRA, female    DOB: August 16, 1945, 67 y.o.   MRN: [id number]  Hypertension Pertinent negatives include no chest pain, headaches, palpitations or shortness of breath.  67 year-old patient who is seen today for a preventive health examination. She has treated hypertension which has been well-controlled. She has hypothyroidism which has been controlled on supplemental levothyroxine. Unfortunately she has resumed smoking. Last year she underwent a pelvic and had a normal Pap done at that time. She did have a colonoscopy performed last year that did reveal colonic polyps Family history is positive for colon cancer with her mother affected and also positive for premature coronary artery disease  Past Medical History  Diagnosis Date  . DEPRESSION 08/26/2008  . HYPERTENSION 08/26/2008  . HYPOTHYROIDISM 08/26/2008  . OSTEOPENIA 08/26/2008  . TOBACCO USER 12/25/2009  . Obesity   . Hx of colonic polyps     First noted on  colonoscopy 2012    History   Social History  . Marital Status: Married    Spouse Name: N/A    Number of Children: N/A  . Years of Education: N/A   Occupational History  . Not on file.   Social History Main Topics  . Smoking status: Current Every Day Smoker  . Smokeless tobacco: Not on file  . Alcohol Use:   . Drug Use:   . Sexually Active:    Other Topics Concern  . Not on file   Social History Narrative  . No narrative on file    Past Surgical History  Procedure Laterality Date  . Gastric bypass    . Cesarean section    . Dilation and curettage of uterus      x2  . Forearm fracture surgery      right    Family History  Problem Relation Age of Onset  . Cancer Mother     colon  . Dementia Mother   . Heart disease Father   . Heart disease Sister   . Heart disease Brother     Allergies  Allergen Reactions  . Morphine Sulfate      Current Outpatient Prescriptions on File Prior to Visit  Medication Sig Dispense Refill  . ALPRAZolam (XANAX) 0.5 MG tablet TAKE ONE TABLET BY MOUTH TWICE A DAY  180 tablet  1  . aspirin 81 MG tablet Take 81 mg by mouth daily.      [name] buPROPion (WELLBUTRIN SR) 150 MG 12 hr tablet Take 150 mg by mouth 2 (two) times daily.        . calcium carbonate 1250 MG capsule Take 1,250 mg by mouth daily.      . cholecalciferol (VITAMIN D) 1000 UNITS tablet Take 1,000 Units by mouth daily.      . ferrous fumarate (HEMOCYTE - 106 MG FE) 325 (106 FE) MG TABS Take 1 tablet by mouth.      . levothyroxine (SYNTHROID, LEVOTHROID) 125 MCG tablet Take 1 tablet (125 mcg total) by mouth daily.  90 tablet  3  . lisinopril (PRINIVIL,ZESTRIL) 20 MG tablet Take 1 tablet (20 mg total) by mouth daily.  90 tablet  6  . magnesium 30 MG tablet Take 30 mg by mouth daily.      . sertraline (ZOLOFT) 100 MG tablet Take 1 tablet (100 mg total) by mouth daily.  90 tablet  6  No current facility-administered medications on file prior to visit.    BP 120/80  Pulse 84  Temp(Src) 98.2 F (36.8 C) (Oral)  Resp 18  Ht 5' 3.5" (1.613 m)  Wt 201 lb (91.173 kg)  BMI 35.04 kg/m2  SpO2 95%   1. Risk factors, based on past  M,S,F history- cardiovascular risk factors include hypertension tobacco use and strong family history of premature heart disease. Family history is also positive for colon cancer in a first-degree relative  2.  Physical activities: Very sedentary. Has had a 10 pound weight gain over the past year  3.  Depression/mood: History depression which has been stable on therapy  4.  Hearing: No deficits  5.  ADL's: Independent in all aspects of daily living  6.  Fall risk: Low  7.  Home safety: No problems identified  8.  Height weight, and visual acuity; height and weight stable has had some mild blurred vision is scheduled for an eye exam tomorrow  9.  Counseling: Smoking cessation more regular exercise  and weight loss all encouraged  10. Lab orders based on risk factors: Laboratory data including lipid panel and TSH will be reviewed  11. Referral : Not appropriate at this time  12. Care plan: Followup colonoscopy 4 years tobacco cessation encouraged  13.  Cognition-  alert and oriented with normal affect no cognitive dysfunction      Review of Systems  Constitutional: Positive for unexpected weight change. Negative for fever, appetite change and fatigue.  HENT: Negative for hearing loss, ear pain, nosebleeds, congestion, sore throat, mouth sores, trouble swallowing, neck stiffness, dental problem, voice change, sinus pressure and tinnitus.   Eyes: Negative for photophobia, pain, redness and visual disturbance.  Respiratory: Negative for cough, chest tightness and shortness of breath.   Cardiovascular: Negative for chest pain, palpitations and leg swelling.  Gastrointestinal: Negative for nausea, vomiting, abdominal pain, diarrhea, constipation, blood in stool, abdominal distention and rectal pain.  Genitourinary: Negative for dysuria, urgency, frequency, hematuria, flank pain, vaginal bleeding, vaginal discharge, difficulty urinating, genital sores, vaginal pain, menstrual problem and pelvic pain.  Musculoskeletal: Negative for back pain and arthralgias.  Skin: Negative for rash.  Neurological: Negative for dizziness, syncope, speech difficulty, weakness, light-headedness, numbness and headaches.  Hematological: Negative for adenopathy. Does not bruise/bleed easily.  Psychiatric/Behavioral: Negative for suicidal ideas, behavioral problems, self-injury, dysphoric mood and agitation. The patient is not nervous/anxious.        Objective:   Physical Exam  Constitutional: She is oriented to person, place, and time. She appears well-developed and well-nourished.  Obese Weight 201  Blood pressure 118/78  HENT:  Head: Normocephalic and atraumatic.  Right Ear: External ear normal.   Left Ear: External ear normal.  Mouth/Throat: Oropharynx is clear and moist.  Eyes: Conjunctivae and EOM are normal.  Neck: Normal range of motion. Neck supple. No JVD present. No thyromegaly present.  Cardiovascular: Normal rate, regular rhythm, normal heart sounds and intact distal pulses.   No murmur heard. Pulmonary/Chest: Effort normal and breath sounds normal. She has no wheezes. She has no rales.  Abdominal: Soft. Bowel sounds are normal. She exhibits no distension and no mass. There is no tenderness. There is no rebound and no guarding.  Musculoskeletal: Normal range of motion. She exhibits no edema and no tenderness.  Neurological: She is alert and oriented to person, place, and time. She has normal reflexes. No cranial nerve deficit. She exhibits normal muscle tone. Coordination normal.  Skin: Skin is warm  and dry. No rash noted.  Psychiatric: She has a normal mood and affect. Her behavior is normal.          Assessment & Plan:   Preventive health exam Hypertension stable. We'll continue present regimen weight loss low salt diet exercise encouraged Exogenous obesity. Weight loss encouraged Hypothyroidism. We'll check a TSH Family history premature coronary artery disease Family history: Cancer History of colonic polyps Depression stable  Recheck one year

## 2012-08-09 NOTE — Patient Instructions (Signed)
Limit your sodium (Salt) intake    It is important that you exercise regularly, at least 20 minutes 3 to 4 times per week.  If you develop chest pain or shortness of breath seek  medical attention.  You need to lose weight.  Consider a lower calorie diet and regular exercise.  Bone density scan as discussed  Please check your blood pressure on a regular basis.  If it is consistently greater than 150/90, please make an office appointment.

## 2012-08-14 ENCOUNTER — Telehealth: Payer: Self-pay | Admitting: Internal Medicine

## 2012-08-14 NOTE — Telephone Encounter (Signed)
PT requesting results from lab work done 08/09/12. Please assist.

## 2012-08-15 NOTE — Telephone Encounter (Signed)
Please call/notify patient that lab/test/procedure is normal  Cholesterol 226

## 2012-08-16 NOTE — Telephone Encounter (Signed)
Spoke to pt told her labs were normal per Dr.Kwiatkowski. Pt verbalized understanding.

## 2012-08-20 ENCOUNTER — Ambulatory Visit (INDEPENDENT_AMBULATORY_CARE_PROVIDER_SITE_OTHER)
Admission: RE | Admit: 2012-08-20 | Discharge: 2012-08-20 | Disposition: A | Discharge status: Home / Self Care | Payer: Medicare Other | Source: Ambulatory Visit

## 2012-08-20 DIAGNOSIS — M899 Disorder of bone, unspecified: Secondary | ICD-10-CM

## 2012-08-20 DIAGNOSIS — M949 Disorder of cartilage, unspecified: Secondary | ICD-10-CM

## 2012-12-27 ENCOUNTER — Other Ambulatory Visit: Payer: Self-pay | Admitting: Internal Medicine

## 2013-01-15 ENCOUNTER — Other Ambulatory Visit: Payer: Self-pay | Admitting: Internal Medicine
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
## 2013-01-18 ENCOUNTER — Telehealth: Payer: Self-pay | Admitting: Internal Medicine

## 2013-01-18 MED ORDER — ALPRAZOLAM 0.5 MG PO TABS: ORAL_TABLET | ORAL | Status: DC

## 2013-01-18 NOTE — Telephone Encounter (Signed)
Pt needs refill of her Xanax, please assist.

## 2013-01-18 NOTE — Telephone Encounter (Signed)
Spoke with pharmacist. Verbal refill given

## 2013-01-18 NOTE — Telephone Encounter (Signed)
Pt following up on request for xanex. This refill was done in 4/17,  90 days w/ 3 refills. Advised pt to fu w/ pharm.

## 2013-04-29 ENCOUNTER — Telehealth: Payer: Self-pay | Admitting: Internal Medicine

## 2013-04-29 MED ORDER — OSELTAMIVIR PHOSPHATE 75 MG PO CAPS: 75.0000 mg | ORAL_CAPSULE | Freq: Two times a day (BID) | ORAL | Status: DC

## 2013-04-29 NOTE — Telephone Encounter (Signed)
Patient Information:  Caller Name: Dena  Phone: [phone]  Patient: Kendra Ball, Kendra Ball  Gender: Female  DOB: 1946/02/05  Age: 68 Years  PCP: Bluford Kaufmann (Family Practice > 7yrs old)  Office Follow Up:  Does the office need to follow up with this patient?: Yes  Instructions For The Office: Patient declines appt. Patient is requesting a RX for Tamiflu to be called into [facility] on [address] at [phone]. Patient can be reached at [phone].  RN Note:  Patient states she was staying with family member who tested positive for the Flu 04/23/13. Patient states she developed fever, per tactile, chills, headache, aching, cough. Onset 04/27/13 p.m. Patient states she does not have a thermometer. States her spouse was prescribed Tamiflu for Flu-like sx 04/26/13. Care advice given per guidelines. Call back parameters reviewed. Patient verbalizes understanding. Patient declines appt. Patient is requesting a RX for Tamiflu to be [facility] into [facility] on [address] at [phone]. Patient can be reached at [phone].  Symptoms  Reason For Call & Symptoms: Cough, fever, chills, aching, headache  Reviewed Health History In EMR: Yes  Reviewed Medications In EMR: Yes  Reviewed Allergies In EMR: Yes  Reviewed Surgeries / Procedures: Yes  Date of Onset of Symptoms: 04/27/2013  Treatments Tried: Dayquil/Nyquil  Treatments Tried Worked: Yes  Any Fever: Yes  Fever Taken: Tactile  Fever Time Of Reading: 08:00:00  Fever Last Reading: N/A  Guideline(s) Used:  Influenza - Seasonal  Disposition Per Guideline:   Go to Office Now  Reason For Disposition Reached:   Fever > 100.5 F (38.1 C) and over 53 years of age  Advice Given:  Pain and Fever Medicines:  For pain or fever relief, take either acetaminophen or ibuprofen.  Treating the Symptoms of Flu  Fever, Muscle Aches, and Headache: For fever more than 101 F (38.3 C), muscle aches, and headaches, take  acetaminophen every 4-6 hours (Adults 650 mg) OR ibuprofen every 6-8 hours (Adults 400-600 mg).  Cough: Use cough drops.  Hydrate: Drink extra liquids. If the air in your home is dry, use a humidifier.  Expected Course  : The fever lasts 2-3 days, the runny nose 5-10 days, and the cough 2-3 weeks.  Call Back If:  Fever lasts more than 3 days  Cough lasts more than 3 weeks  You become short of breath or worse.  Patient Refused Recommendation:  Patient Requests Prescription  Patient declines appt. Patient is requesting a RX for Tamiflu to be called into [facility] on [address] at [phone]. Patient can be reached at [phone].

## 2013-04-29 NOTE — Telephone Encounter (Signed)
Pt notified Rx for Tamiflu sent to pharmacy.

## 2013-04-29 NOTE — Telephone Encounter (Signed)
Okay Tamiflu  75 mg #10 one twice a day

## 2013-07-08 ENCOUNTER — Other Ambulatory Visit: Payer: Self-pay | Admitting: Internal Medicine

## 2013-08-12 ENCOUNTER — Encounter: Payer: Medicare Other | Admitting: Internal Medicine

## 2013-08-13 ENCOUNTER — Ambulatory Visit (INDEPENDENT_AMBULATORY_CARE_PROVIDER_SITE_OTHER): Payer: Medicare Other | Admitting: Nurse Practitioner

## 2013-08-13 ENCOUNTER — Encounter: Payer: Self-pay | Admitting: Nurse Practitioner

## 2013-08-13 VITALS — BP 141/84 | HR 71 | Temp 97.7°F | Ht 63.5 in | Wt 200.0 lb

## 2013-08-13 DIAGNOSIS — M8580 Other specified disorders of bone density and structure, unspecified site: Secondary | ICD-10-CM

## 2013-08-13 DIAGNOSIS — M25552 Pain in left hip: Secondary | ICD-10-CM

## 2013-08-13 DIAGNOSIS — F329 Major depressive disorder, single episode, unspecified: Secondary | ICD-10-CM

## 2013-08-13 DIAGNOSIS — I10 Essential (primary) hypertension: Secondary | ICD-10-CM

## 2013-08-13 DIAGNOSIS — M899 Disorder of bone, unspecified: Secondary | ICD-10-CM

## 2013-08-13 DIAGNOSIS — Z6834 Body mass index (BMI) 34.0-34.9, adult: Secondary | ICD-10-CM

## 2013-08-13 DIAGNOSIS — F17200 Nicotine dependence, unspecified, uncomplicated: Secondary | ICD-10-CM

## 2013-08-13 DIAGNOSIS — R0789 Other chest pain: Secondary | ICD-10-CM

## 2013-08-13 DIAGNOSIS — Z0000 Encounter for general adult medical examination without abnormal findings: Secondary | ICD-10-CM

## 2013-08-13 DIAGNOSIS — E039 Hypothyroidism, unspecified: Secondary | ICD-10-CM

## 2013-08-13 DIAGNOSIS — M949 Disorder of cartilage, unspecified: Secondary | ICD-10-CM

## 2013-08-13 DIAGNOSIS — Z23 Encounter for immunization: Secondary | ICD-10-CM

## 2013-08-13 DIAGNOSIS — Z1239 Encounter for other screening for malignant neoplasm of breast: Secondary | ICD-10-CM

## 2013-08-13 DIAGNOSIS — L989 Disorder of the skin and subcutaneous tissue, unspecified: Secondary | ICD-10-CM

## 2013-08-13 DIAGNOSIS — Z9884 Bariatric surgery status: Secondary | ICD-10-CM

## 2013-08-13 DIAGNOSIS — F3289 Other specified depressive episodes: Secondary | ICD-10-CM

## 2013-08-13 DIAGNOSIS — Z8249 Family history of ischemic heart disease and other diseases of the circulatory system: Secondary | ICD-10-CM

## 2013-08-13 DIAGNOSIS — Z6829 Body mass index (BMI) 29.0-29.9, adult: Secondary | ICD-10-CM | POA: Insufficient documentation

## 2013-08-13 DIAGNOSIS — M25559 Pain in unspecified hip: Secondary | ICD-10-CM

## 2013-08-13 HISTORY — DX: Family history of ischemic heart disease and other diseases of the circulatory system: Z82.49

## 2013-08-13 LAB — URINALYSIS, ROUTINE W REFLEX MICROSCOPIC
Bilirubin Urine: NEGATIVE
Hgb urine dipstick: NEGATIVE
Ketones, ur: NEGATIVE
Leukocytes, UA: NEGATIVE
Nitrite: NEGATIVE
Specific Gravity, Urine: 1.025 (ref 1.000–1.030)
Total Protein, Urine: NEGATIVE
Urine Glucose: NEGATIVE
Urobilinogen, UA: 0.2 (ref 0.0–1.0)
pH: 5.5 (ref 5.0–8.0)

## 2013-08-13 LAB — LIPID PANEL
Cholesterol: 203 mg/dL — ABNORMAL HIGH (ref 0–200)
HDL: 70.4 mg/dL (ref >39.00)
LDL Cholesterol: 108 mg/dL — ABNORMAL HIGH (ref 0–99)
Total CHOL/HDL Ratio: 3
Triglycerides: 124 mg/dL (ref 0.0–149.0)
VLDL: 24.8 mg/dL (ref 0.0–40.0)

## 2013-08-13 LAB — CBC WITH DIFFERENTIAL/PLATELET
Basophils Absolute: 0 10*3/uL (ref 0.0–0.1)
Basophils Relative: 0.6 % (ref 0.0–3.0)
Eosinophils Absolute: 0.1 10*3/uL (ref 0.0–0.7)
Eosinophils Relative: 1 % (ref 0.0–5.0)
HCT: 44.8 % (ref 36.0–46.0)
Hemoglobin: 15 g/dL (ref 12.0–15.0)
Lymphocytes Relative: 30.5 % (ref 12.0–46.0)
Lymphs Abs: 2.4 10*3/uL (ref 0.7–4.0)
MCHC: 33.6 g/dL (ref 30.0–36.0)
MCV: 96 fl (ref 78.0–100.0)
Monocytes Absolute: 0.5 10*3/uL (ref 0.1–1.0)
Monocytes Relative: 6.5 % (ref 3.0–12.0)
Neutro Abs: 4.9 10*3/uL (ref 1.4–7.7)
Neutrophils Relative %: 61.4 % (ref 43.0–77.0)
Platelets: 320 10*3/uL (ref 150.0–400.0)
RBC: 4.66 Mil/uL (ref 3.87–5.11)
RDW: 13.5 % (ref 11.5–14.6)
WBC: 8 10*3/uL (ref 4.5–10.5)

## 2013-08-13 LAB — COMPREHENSIVE METABOLIC PANEL
ALT: 25 U/L (ref 0–35)
AST: 22 U/L (ref 0–37)
Albumin: 3.9 g/dL (ref 3.5–5.2)
Alkaline Phosphatase: 57 U/L (ref 39–117)
BUN: 9 mg/dL (ref 6–23)
CO2: 29 mEq/L (ref 19–32)
Calcium: 9.5 mg/dL (ref 8.4–10.5)
Chloride: 105 mEq/L (ref 96–112)
Creatinine, Ser: 0.6 mg/dL (ref 0.4–1.2)
GFR: 100.12 mL/min (ref >60.00)
Glucose, Bld: 86 mg/dL (ref 70–99)
Potassium: 4 mEq/L (ref 3.5–5.1)
Sodium: 140 mEq/L (ref 135–145)
Total Bilirubin: 0.6 mg/dL (ref 0.3–1.2)
Total Protein: 6.9 g/dL (ref 6.0–8.3)

## 2013-08-13 LAB — TSH: TSH: 0.12 u[IU]/mL — ABNORMAL LOW (ref 0.35–5.50)

## 2013-08-13 LAB — HEMOGLOBIN A1C: Hgb A1c MFr Bld: 5.3 % (ref 4.6–6.5)

## 2013-08-13 LAB — VITAMIN B12: Vitamin B-12: 469 pg/mL (ref 211–911)

## 2013-08-13 LAB — HIGH SENSITIVITY CRP: CRP, High Sensitivity: 3.99 mg/L (ref 0.000–5.000)

## 2013-08-13 LAB — MICROALBUMIN / CREATININE URINE RATIO
Creatinine,U: 95.2 mg/dL
Microalb Creat Ratio: 0.6 mg/g (ref 0.0–30.0)
Microalb, Ur: 0.6 mg/dL (ref 0.0–1.9)

## 2013-08-13 NOTE — Assessment & Plan Note (Signed)
Tender over greater trochanteric bursa.  Steroid injection today. Daily stretches. Will xray & refer to PT if no relief.

## 2013-08-13 NOTE — Progress Notes (Signed)
Pre visit review using our clinic review tool, if applicable. No additional management support is needed unless otherwise documented below in the visit note. 

## 2013-08-13 NOTE — Assessment & Plan Note (Signed)
Ref to derm for eval & Tx

## 2013-08-13 NOTE — Assessment & Plan Note (Signed)
Screening labs, [facility] Admin pneumo 23 today Next CPE 1-2 yrs.

## 2013-08-13 NOTE — Assessment & Plan Note (Signed)
TSH today C/o worsened depression stated as "life seems like more work", "could sleep all day". Continue current meds. Will monitor results & make changes as necessary.

## 2013-08-13 NOTE — Progress Notes (Addendum)
Subjective:     Kendra Ball is a 68 y.o. female presents to establish care, get CPE, and discuss R hip pain. I reviewed all medications, addressed depression-pt feels meds are not working: feels tired, life is effort, activities not as joyous. Pt wonders if family dynamics contribute to depression. She is concerned about black lesion on face-been there several years, but grown in last year. R hip pain for 1 year. Most painful after sitting or prolonged walk, lying on L side-wakes in night. Movement & stretches help. Pain is low to moderate.    The following portions of the patient's history were reviewed and updated as appropriate: allergies, current medications, past family history, past medical history, past social history, past surgical history and problem list.  Review of Systems Constitutional: positive for fatigue Eyes: positive for contacts/glasses Respiratory: negative for cough Cardiovascular: positive for dull feeling in chest at times, negative for irregular heart beat and lower extremity edema Gastrointestinal: positive for diarrhea and globus sensation, negative for constipation and vomiting Musculoskeletal:positive for R hip pain Behavioral/Psych: positive for depression and fatigue, negative for excessive alcohol consumption, illegal drug usage and sleep disturbance Endocrine: negative for diabetic symptoms including polydipsia, polyphagia and polyuria    Objective:    BP 141/84  Pulse 71  Temp(Src) 97.7 F (36.5 C) (Temporal)  Ht 5' 3.5" (1.613 m)  Wt 200 lb (90.719 kg)  BMI 34.87 kg/m2  SpO2 95% BP 141/84  Pulse 71  Temp(Src) 97.7 F (36.5 C) (Temporal)  Ht 5' 3.5" (1.613 m)  Wt 200 lb (90.719 kg)  BMI 34.87 kg/m2  SpO2 95% General appearance: alert, cooperative, appears stated age and no distress Head: Normocephalic, without obvious abnormality, atraumatic Eyes: negative findings: lids and lashes normal and conjunctivae and sclerae normal Ears: normal  TM's and external ear canals both ears Throat: lips, mucosa, and tongue normal; teeth and gums normal Neck: no adenopathy, no carotid bruit, supple, symmetrical, trachea midline and thyroid not enlarged, symmetric, no tenderness/mass/nodules Back: no tenderness to percussion or palpation, range of motion normal, kyphosis Lungs: clear to auscultation bilaterally Heart: regular rate and rhythm, S1, S2 normal, no murmur, click, rub or gallop Abdomen: soft, non-tender; bowel sounds normal; no masses,  no organomegaly Extremities: extremities normal, atraumatic, no cyanosis or edema Pulses: 2+ and symmetric Skin: black lesion L cheek 5 mm looks like 2 adjacent moles; R abdomen, oval black & red, 3 mm Lymph nodes: Cervical, supraclavicular, and axillary nodes normal.    Procedure note: L hip intraarticular injection: Verbal consent obtained for intraarticular injection. Potential complications explained including pain, infection, tendon rupture. Tender area palpated and marked over R greater trocanter. Area cleansed with 3 betadine swabs. Ethy chloride spray used for anesthesia until skin blanche. Injected area using 25 ga 1 1/2 " needle. No blood or fluid return on aspiration. 1.5 ml 1% lidocaine and 20 mg depo-medrol injected. Pt tolerated procedure with no pain. Pressure held at site for 1 minute. No bleeding or hematoma noted. Covered site with band-aid. Advised no long walks today. Start Gentle stretches tomorrow.  Assessment & Plan:     1. Family history of premature coronary heart disease   2. Chest pressure   3. Smoker   4. Skin lesion of face   5. Depressive disorder, not elsewhere classified   6. Breast cancer screening   7. Preventative health care   8. Essential hypertension, benign   9. Osteopenia   10. Unspecified hypothyroidism   11. H/O gastric bypass  12. BMI 34.0-34.9,adult   13. Need for pneumococcal vaccination   14. OSTEOPENIA   15. Left hip pain     See problem list  for complete A&P    F/u 2-3 weeks

## 2013-08-13 NOTE — Assessment & Plan Note (Addendum)
ECG in ofc today: NSR HS-CRP today Encourage smoking cessation.

## 2013-08-13 NOTE — Assessment & Plan Note (Signed)
Pt feels meds are not working as "life feels like work", "lost the heart" for activities she used to enjoy. Thinks family dynamics contribute to depression. Denies SI Ref to psych for medication adjustment.

## 2013-08-13 NOTE — Patient Instructions (Addendum)
Our office will call you with lab results and any changes in treatment. Perform stretches for hip daily-as discussed in ofc and read over exercises below. Hold positions for 5-10 sec, 5 repetitons. I will see you in 1 month to re-evaluate. I will consider xray and referal to PT if no improvement. Please dermatology and psychiatry, and get mammogram completed. Consider using nicotene gum throughout day to smoke 5 to 10 fewer cigarettes daily to lower your risks for smoking associated diseases. Continue current medications. Pleasure to meet you!  Preventive Care for Adults, Female A healthy lifestyle and preventive care can promote health and wellness. Preventive health guidelines for women include the following key practices.  A routine yearly physical is a good way to check with your caregiver about your health and preventive screening. It is a chance to share any concerns and updates on your health, and to receive a thorough exam.  Visit your dentist for a routine exam and preventive care every 6 months. Brush your teeth twice a day and floss once a day. Good oral hygiene prevents tooth decay and gum disease.  The frequency of eye exams is based on your age, health, family medical history, use of contact lenses, and other factors. Follow your caregiver's recommendations for frequency of eye exams.  Eat a healthy diet. Foods like vegetables, fruits, whole grains, low-fat dairy products, and lean protein foods contain the nutrients you need without too many calories. Decrease your intake of foods high in solid fats, added sugars, and salt. Eat the right amount of calories for you.Get information about a proper diet from your caregiver, if necessary.  Regular physical exercise is one of the most important things you can do for your health. Most adults should get at least 150 minutes of moderate-intensity exercise (any activity that increases your heart rate and causes you to sweat) each week. In  addition, most adults need muscle-strengthening exercises on 2 or more days a week.  Maintain a healthy weight. The body mass index (BMI) is a screening tool to identify possible weight problems. It provides an estimate of body fat based on height and weight. Your caregiver can help determine your BMI, and can help you achieve or maintain a healthy weight.For adults 20 years and older:  A BMI below 18.5 is considered underweight.  A BMI of 18.5 to 24.9 is normal.  A BMI of 25 to 29.9 is considered overweight.  A BMI of 30 and above is considered obese.  Maintain normal blood lipids and cholesterol levels by exercising and minimizing your intake of saturated fat. Eat a balanced diet with plenty of fruit and vegetables. Blood tests for lipids and cholesterol should begin at age 70 and be repeated every 5 years. If your lipid or cholesterol levels are high, you are over 50, or you are at high risk for heart disease, you may need your cholesterol levels checked more frequently.Ongoing high lipid and cholesterol levels should be treated with medicines if diet and exercise are not effective.  If you smoke, find out from your caregiver how to quit. If you do not use tobacco, do not start.  Lung cancer screening is recommended for adults aged 40 80 years who are at high risk for developing lung cancer because of a history of smoking. Yearly low-dose computed tomography (CT) is recommended for people who have at least a 30-pack-year history of smoking and are a current smoker or have quit within the past 15 years. A pack year of  smoking is smoking an average of 1 pack of cigarettes a day for 1 year (for example: 1 pack a day for 30 years or 2 packs a day for 15 years). Yearly screening should continue until the smoker has stopped smoking for at least 15 years. Yearly screening should also be stopped for people who develop a health problem that would prevent them from having lung cancer treatment.  If you  are pregnant, do not drink alcohol. If you are breastfeeding, be very cautious about drinking alcohol. If you are not pregnant and choose to drink alcohol, do not exceed 1 drink per day. One drink is considered to be 12 ounces (355 mL) of beer, 5 ounces (148 mL) of wine, or 1.5 ounces (44 mL) of liquor.  Avoid use of street drugs. Do not share needles with anyone. Ask for help if you need support or instructions about stopping the use of drugs.  High blood pressure causes heart disease and increases the risk of stroke. Your blood pressure should be checked at least every 1 to 2 years. Ongoing high blood pressure should be treated with medicines if weight loss and exercise are not effective.  If you are 52 to 67 years old, ask your caregiver if you should take aspirin to prevent strokes.  Diabetes screening involves taking a blood sample to check your fasting blood sugar level. This should be done once every 3 years, after age 55, if you are within normal weight and without risk factors for diabetes. Testing should be considered at a younger age or be carried out more frequently if you are overweight and have at least 1 risk factor for diabetes.  Breast cancer screening is essential preventive care for women. You should practice "breast self-awareness." This means understanding the normal appearance and feel of your breasts and may include breast self-examination. Any changes detected, no matter how small, should be reported to a caregiver. Women in their 81s and 30s should have a clinical breast exam (CBE) by a caregiver as part of a regular health exam every 1 to 3 years. After age 63, women should have a CBE every year. Starting at age 35, women should consider having a mammography (breast X-ray test) every year. Women who have a family history of breast cancer should talk to their caregiver about genetic screening. Women at a high risk of breast cancer should talk to their caregivers about having  magnetic resonance imaging (MRI) and a mammography every year.  Breast cancer gene (BRCA)-related cancer risk assessment is recommended for women who have family members with BRCA-related cancers. BRCA-related cancers include breast, ovarian, tubal, and peritoneal cancers. Having family members with these cancers may be associated with an increased risk for harmful changes (mutations) in the breast cancer genes BRCA1 and BRCA2. Results of the assessment will determine the need for genetic counseling and BRCA1 and BRCA2 testing.  The Pap test is a screening test for cervical cancer. A Pap test can show cell changes on the cervix that might become cervical cancer if left untreated. A Pap test is a procedure in which cells are obtained and examined from the lower end of the uterus (cervix).  Women should have a Pap test starting at age 51.  Between ages 45 and 25, Pap tests should be repeated every 2 years.  Beginning at age 51, you should have a Pap test every 3 years as long as the past 3 Pap tests have been normal.  Some women have medical problems  that increase the chance of getting cervical cancer. Talk to your caregiver about these problems. It is especially important to talk to your caregiver if a new problem develops soon after your last Pap test. In these cases, your caregiver may recommend more frequent screening and Pap tests.  The above recommendations are the same for women who have or have not gotten the vaccine for human papillomavirus (HPV).  If you had a hysterectomy for a problem that was not cancer or a condition that could lead to cancer, then you no longer need Pap tests. Even if you no longer need a Pap test, a regular exam is a good idea to make sure no other problems are starting.  If you are between ages 64 and 74, and you have had normal Pap tests going back 10 years, you no longer need Pap tests. Even if you no longer need a Pap test, a regular exam is a good idea to make  sure no other problems are starting.  If you have had past treatment for cervical cancer or a condition that could lead to cancer, you need Pap tests and screening for cancer for at least 20 years after your treatment.  If Pap tests have been discontinued, risk factors (such as a new sexual partner) need to be reassessed to determine if screening should be resumed.  The HPV test is an additional test that may be used for cervical cancer screening. The HPV test looks for the virus that can cause the cell changes on the cervix. The cells collected during the Pap test can be tested for HPV. The HPV test could be used to screen women aged 73 years and older, and should be used in women of any age who have unclear Pap test results. After the age of 69, women should have HPV testing at the same frequency as a Pap test.  Colorectal cancer can be detected and often prevented. Most routine colorectal cancer screening begins at the age of 42 and continues through age 22. However, your caregiver may recommend screening at an earlier age if you have risk factors for colon cancer. On a yearly basis, your caregiver may provide home test kits to check for hidden blood in the stool. Use of a small camera at the end of a tube, to directly examine the colon (sigmoidoscopy or colonoscopy), can detect the earliest forms of colorectal cancer. Talk to your caregiver about this at age 45, when routine screening begins. Direct examination of the colon should be repeated every 5 to 10 years through age 22, unless early forms of pre-cancerous polyps or small growths are found.  Hepatitis C blood testing is recommended for all people born from [birth year] through 1965 and any individual with known risks for hepatitis C.  Practice safe sex. Use condoms and avoid high-risk sexual practices to reduce the spread of sexually transmitted infections (STIs). STIs include gonorrhea, chlamydia, syphilis, trichomonas, herpes, HPV, and human  immunodeficiency virus (HIV). Herpes, HIV, and HPV are viral illnesses that have no cure. They can result in disability, cancer, and death. Sexually active women aged 61 and younger should be checked for chlamydia. Older women with new or multiple partners should also be tested for chlamydia. Testing for other STIs is recommended if you are sexually active and at increased risk.  Osteoporosis is a disease in which the bones lose minerals and strength with aging. This can result in serious bone fractures. The risk of osteoporosis can be identified using a  bone density scan. Women ages 79 and over and women at risk for fractures or osteoporosis should discuss screening with their caregivers. Ask your caregiver whether you should take a calcium supplement or vitamin D to reduce the rate of osteoporosis.  Menopause can be associated with physical symptoms and risks. Hormone replacement therapy is available to decrease symptoms and risks. You should talk to your caregiver about whether hormone replacement therapy is right for you.  Use sunscreen. Apply sunscreen liberally and repeatedly throughout the day. You should seek shade when your shadow is shorter than you. Protect yourself by wearing long sleeves, pants, a wide-brimmed hat, and sunglasses year round, whenever you are outdoors.  Once a month, do a whole body skin exam, using a mirror to look at the skin on your back. Notify your caregiver of new moles, moles that have irregular borders, moles that are larger than a pencil eraser, or moles that have changed in shape or color.  Stay current with required immunizations.  Influenza vaccine. All adults should be immunized every year.  Tetanus, diphtheria, and acellular pertussis (Td, Tdap) vaccine. Pregnant women should receive 1 dose of Tdap vaccine during each pregnancy. The dose should be obtained regardless of the length of time since the last dose. Immunization is preferred during the 27th to 36th  week of gestation. An adult who has not previously received Tdap or who does not know her vaccine status should receive 1 dose of Tdap. This initial dose should be followed by tetanus and diphtheria toxoids (Td) booster doses every 10 years. Adults with an unknown or incomplete history of completing a 3-dose immunization series with Td-containing vaccines should begin or complete a primary immunization series including a Tdap dose. Adults should receive a Td booster every 10 years.  Varicella vaccine. An adult without evidence of immunity to varicella should receive 2 doses or a second dose if she has previously received 1 dose. Pregnant females who do not have evidence of immunity should receive the first dose after pregnancy. This first dose should be obtained before leaving the health care facility. The second dose should be obtained 4 8 weeks after the first dose.  Human papillomavirus (HPV) vaccine. Females aged 66 26 years who have not received the vaccine previously should obtain the 3-dose series. The vaccine is not recommended for use in pregnant females. However, pregnancy testing is not needed before receiving a dose. If a female is found to be pregnant after receiving a dose, no treatment is needed. In that case, the remaining doses should be delayed until after the pregnancy. Immunization is recommended for any person with an immunocompromised condition through the age of 9 years if she did not get any or all doses earlier. During the 3-dose series, the second dose should be obtained 4 8 weeks after the first dose. The third dose should be obtained 24 weeks after the first dose and 16 weeks after the second dose.  Zoster vaccine. One dose is recommended for adults aged 76 years or older unless certain conditions are present.  Measles, mumps, and rubella (MMR) vaccine. Adults born before 75 generally are considered immune to measles and mumps. Adults born in [birth year] or later should have 1 or more  doses of MMR vaccine unless there is a contraindication to the vaccine or there is laboratory evidence of immunity to each of the three diseases. A routine second dose of MMR vaccine should be obtained at least 28 days after the first dose for students  attending postsecondary schools, health care workers, or international travelers. People who received inactivated measles vaccine or an unknown type of measles vaccine during 1963 1967 should receive 2 doses of MMR vaccine. People who received inactivated mumps vaccine or an unknown type of mumps vaccine before 1979 and are at high risk for mumps infection should consider immunization with 2 doses of MMR vaccine. For females of childbearing age, rubella immunity should be determined. If there is no evidence of immunity, females who are not pregnant should be vaccinated. If there is no evidence of immunity, females who are pregnant should delay immunization until after pregnancy. Unvaccinated health care workers born before [birth year] who lack laboratory evidence of measles, mumps, or rubella immunity or laboratory confirmation of disease should consider measles and mumps immunization with 2 doses of MMR vaccine or rubella immunization with 1 dose of MMR vaccine.  Pneumococcal 13-valent conjugate (PCV13) vaccine. When indicated, a person who is uncertain of her immunization history and has no record of immunization should receive the PCV13 vaccine. An adult aged 59 years or older who has certain medical conditions and has not been previously immunized should receive 1 dose of PCV13 vaccine. This PCV13 should be followed with a dose of pneumococcal polysaccharide (PPSV23) vaccine. The PPSV23 vaccine dose should be obtained at least 8 weeks after the dose of PCV13 vaccine. An adult aged 46 years or older who has certain medical conditions and previously received 1 or more doses of PPSV23 vaccine should receive 1 dose of PCV13. The PCV13 vaccine dose should be obtained 1 or  more years after the last PPSV23 vaccine dose.  Pneumococcal polysaccharide (PPSV23) vaccine. When PCV13 is also indicated, PCV13 should be obtained first. All adults aged 60 years and older should be immunized. An adult younger than age 56 years who has certain medical conditions should be immunized. Any person who resides in a nursing home or long-term care facility should be immunized. An adult smoker should be immunized. People with an immunocompromised condition and certain other conditions should receive both PCV13 and PPSV23 vaccines. People with human immunodeficiency virus (HIV) infection should be immunized as soon as possible after diagnosis. Immunization during chemotherapy or radiation therapy should be avoided. Routine use of PPSV23 vaccine is not recommended for American Indians, [place] Natives, or people younger than 65 years unless there are medical conditions that require PPSV23 vaccine. When indicated, people who have unknown immunization and have no record of immunization should receive PPSV23 vaccine. One-time revaccination 5 years after the first dose of PPSV23 is recommended for people aged 53 64 years who have chronic kidney failure, nephrotic syndrome, asplenia, or immunocompromised conditions. People who received 1 2 doses of PPSV23 before age 22 years should receive another dose of PPSV23 vaccine at age 34 years or later if at least 5 years have passed since the previous dose. Doses of PPSV23 are not needed for people immunized with PPSV23 at or after age 78 years.  Meningococcal vaccine. Adults with asplenia or persistent complement component deficiencies should receive 2 doses of quadrivalent meningococcal conjugate (MenACWY-D) vaccine. The doses should be obtained at least 2 months apart. Microbiologists working with certain meningococcal bacteria, [employer] recruits, people at risk during an outbreak, and people who travel to or live in countries with a high rate of meningitis  should be immunized. A first-year college student up through age 5 years who is living in a residence hall should receive a dose if she did not receive a dose on or  after her 16th birthday. Adults who have certain high-risk conditions should receive one or more doses of vaccine.  Hepatitis A vaccine. Adults who wish to be protected from this disease, have certain high-risk conditions, work with hepatitis A-infected animals, work in hepatitis A research labs, or travel to or work in countries with a high rate of hepatitis A should be immunized. Adults who were previously unvaccinated and who anticipate close contact with an international adoptee during the first 60 days after arrival in the [place] from a country with a high rate of hepatitis A should be immunized.  Hepatitis B vaccine. Adults who wish to be protected from this disease, have certain high-risk conditions, may be exposed to blood or other infectious body fluids, are household contacts or sex partners of hepatitis B positive people, are clients or workers in certain care facilities, or travel to or work in countries with a high rate of hepatitis B should be immunized.  Haemophilus influenzae type b (Hib) vaccine. A previously unvaccinated person with asplenia or sickle cell disease or having a scheduled splenectomy should receive 1 dose of Hib vaccine. Regardless of previous immunization, a recipient of a hematopoietic stem cell transplant should receive a 3-dose series 6 12 months after her successful transplant. Hib vaccine is not recommended for adults with HIV infection. Preventive Services / Frequency Ages 71 and over  Blood pressure check.** / Every 1 to 2 years.  Lipid and cholesterol check.** / Every 5 years beginning at age 65.  Lung cancer screening. / Every year if you are aged 107 80 years and have a 30-pack-year history of smoking and currently smoke or have quit within the past 15 years. Yearly screening is stopped once  you have quit smoking for at least 15 years or develop a health problem that would prevent you from having lung cancer treatment.  Clinical breast exam.** / Every year after age 76.  BRCA-related cancer risk assessment.** / For women who have family members with a BRCA-related cancer (breast, ovarian, tubal, or peritoneal cancers).  Mammogram.** / Every year beginning at age 33 and continuing for as long as you are in good health. Consult with your caregiver.  Pap test.** / Every 3 years starting at age 7 through age 33 or 50 with a 3 consecutive normal Pap tests. Testing can be stopped between 65 and 70 with 3 consecutive normal Pap tests and no abnormal Pap or HPV tests in the past 10 years.  HPV screening.** / Every 3 years from ages 91 through ages 9 or 45 with a history of 3 consecutive normal Pap tests. Testing can be stopped between 65 and 70 with 3 consecutive normal Pap tests and no abnormal Pap or HPV tests in the past 10 years.  Fecal occult blood test (FOBT) of stool. / Every year beginning at age 50 and continuing until age 30. You may not need to do this test if you get a colonoscopy every 10 years.  Flexible sigmoidoscopy or colonoscopy.** / Every 5 years for a flexible sigmoidoscopy or every 10 years for a colonoscopy beginning at age 55 and continuing until age 24.  Hepatitis C blood test.** / For all people born from [birth year] through 1964 and any individual with known risks for hepatitis C.  Osteoporosis screening.** / A one-time screening for women ages 67 and over and women at risk for fractures or osteoporosis.  Skin self-exam. / Monthly.  Influenza vaccine. / Every year.  Tetanus, diphtheria, and acellular pertussis (  Tdap/Td) vaccine.** / 1 dose of Td every 10 years.  Varicella vaccine.** / Consult your caregiver.  Zoster vaccine.** / 1 dose for adults aged 65 years or older.  Pneumococcal 13-valent conjugate (PCV13) vaccine.** / Consult your  caregiver.  Pneumococcal polysaccharide (PPSV23) vaccine.** / 1 dose for all adults aged 46 years and older.  Meningococcal vaccine.** / Consult your caregiver.  Hepatitis A vaccine.** / Consult your caregiver.  Hepatitis B vaccine.** / Consult your caregiver.  Haemophilus influenzae type b (Hib) vaccine.** / Consult your caregiver. ** Family history and personal history of risk and conditions may change your caregiver's recommendations. Document Released: 06/07/2001 Document Revised: 08/06/2012 Document Reviewed: 09/06/2010 [facility] Patient Information 2014 [place], [place].  Hip Bursitis Bursitis is a swelling and soreness (inflammation) of a fluid-filled sac (bursa). This sac overlies and protects the joints.  CAUSES   Injury.  Overuse of the muscles surrounding the joint.  Arthritis.  Gout.  Infection.  Cold weather.  Inadequate warm-up and conditioning prior to activities. The cause may not be known.  SYMPTOMS   Mild to severe irritation.  Tenderness and swelling over the outside of the hip.  Pain with motion of the hip.  If the bursa becomes infected, a fever may be present. Redness, tenderness, and warmth will develop over the hip. Symptoms usually lessen in 3 to 4 weeks with treatment, but can come back. TREATMENT If conservative treatment does not work, your caregiver may advise draining the bursa and injecting cortisone into the area. This may speed up the healing process. This may also be used as an initial treatment of choice. HOME CARE INSTRUCTIONS   Apply ice to the affected area for 15-20 minutes every 3 to 4 hours while awake for the first 2 days. Put the ice in a plastic bag and place a towel between the bag of ice and your skin.  Rest the painful joint as much as possible, but continue to put the joint through a normal range of motion at least 4 times per day. When the pain lessens, begin normal, slow movements and usual activities to help prevent  stiffness of the hip.  Only take over-the-counter or prescription medicines for pain, discomfort, or fever as directed by your caregiver.  Use crutches to limit weight bearing on the hip joint, if advised.  Elevate your painful hip to reduce swelling. Use pillows for propping and cushioning your legs and hips.  Gentle massage may provide comfort and decrease swelling. SEEK IMMEDIATE MEDICAL CARE IF:   Your pain increases even during treatment, or you are not improving.  You have a fever.  You have heat and inflammation over the involved bursa.  You have any other questions or concerns. MAKE SURE YOU:   Understand these instructions.  Will watch your condition.  Will get help right away if you are not doing well or get worse. Document Released: 10/01/2001 Document Revised: 07/04/2011 Document Reviewed: 04/30/2008 [facility] Patient Information 2014 [place], [place].  Hip Exercises RANGE OF MOTION (ROM) AND STRETCHING EXERCISES  These exercises may help you when beginning to rehabilitate your injury. Doing them too aggressively can worsen your condition. Complete them slowly and gently. Your symptoms may resolve with or without further involvement from your physician, physical therapist or athletic trainer. While completing these exercises, remember:   Restoring tissue flexibility helps normal motion to return to the joints. This allows healthier, less painful movement and activity.  An effective stretch should be held for at least 30 seconds.  A  stretch should never be painful. You should only feel a gentle lengthening or release in the stretched tissue. If these stretches worsen your symptoms even when done gently, consult your physician, physical therapist or athletic trainer. STRETCH Hamstrings, Supine   Lie on your back. Loop a belt or towel over the ball of your right / left foot.  Straighten your right / left knee and slowly pull on the belt to raise your leg. Do not allow  the right / left knee to bend. Keep your opposite leg flat on the floor.  Raise the leg until you feel a gentle stretch behind your right / left knee or thigh. Hold this position for __________ seconds. Repeat __________ times. Complete this stretch __________ times per day.  STRETCH - Hip Rotators   Lie on your back on a firm surface. Grasp your right / left knee with your right / left hand and your ankle with your opposite hand.  Keeping your hips and shoulders firmly planted, gently pull your right / left knee and rotate your lower leg toward your opposite shoulder until you feel a stretch in your buttocks.  Hold this stretch for __________ seconds. Repeat this stretch __________ times. Complete this stretch __________ times per day. STRETCH - Hamstrings/Adductors, V-Sit   Sit on the floor with your legs extended in a large "V," keeping your knees straight.  With your head and chest upright, bend at your waist reaching for your right foot to stretch your left adductors.  You should feel a stretch in your left inner thigh. Hold for __________ seconds.  Return to the upright position to relax your leg muscles.  Continuing to keep your chest upright, bend straight forward at your waist to stretch your hamstrings.  You should feel a stretch behind both of your thighs and/or knees. Hold for __________ seconds.  Return to the upright position to relax your leg muscles.  Repeat steps 2 through 4 for opposite leg. Repeat __________ times. Complete this exercise __________ times per day.  STRETCHING - Hip Flexors, Lunge  Half kneel with your right / left knee on the floor and your opposite knee bent and directly over your ankle.  Keep good posture with your head over your shoulders. Tighten your buttocks to point your tailbone downward; this will prevent your back from arching too much.  You should feel a gentle stretch in the front of your thigh and/or hip. If you do not feel any  resistance, slightly slide your opposite foot forward and then slowly lunge forward so your knee once again lines up over your ankle. Be sure your tailbone remains pointed downward.  Hold this stretch for __________ seconds. Repeat __________ times. Complete this stretch __________ times per day. STRENGTHENING EXERCISES These exercises may help you when beginning to rehabilitate your injury. They may resolve your symptoms with or without further involvement from your physician, physical therapist or athletic trainer. While completing these exercises, remember:   Muscles can gain both the endurance and the strength needed for everyday activities through controlled exercises.  Complete these exercises as instructed by your physician, physical therapist or athletic trainer. Progress the resistance and repetitions only as guided.  You may experience muscle soreness or fatigue, but the pain or discomfort you are trying to eliminate should never worsen during these exercises. If this pain does worsen, stop and make certain you are following the directions exactly. If the pain is still present after adjustments, discontinue the exercise until you can discuss  the trouble with your clinician. STRENGTH - Hip Extensors, Bridge   Lie on your back on a firm surface. Bend your knees and place your feet flat on the floor.  Tighten your buttocks muscles and lift your bottom off the floor until your trunk is level with your thighs. You should feel the muscles in your buttocks and back of your thighs working. If you do not feel these muscles, slide your feet 1-2 inches further away from your buttocks.  Hold this position for __________ seconds.  Slowly lower your hips to the starting position and allow your buttock muscles relax completely before beginning the next repetition.  If this exercise is too easy, you may cross your arms over your chest. Repeat __________ times. Complete this exercise __________ times  per day.  STRENGTH - Hip Abductors, Straight Leg Raises  Be aware of your form throughout the entire exercise so that you exercise the correct muscles. Sloppy form means that you are not strengthening the correct muscles.  Lie on your side so that your head, shoulders, knee and hip line up. You may bend your lower knee to help maintain your balance. Your right / left leg should be on top.  Roll your hips slightly forward, so that your hips are stacked directly over each other and your right / left knee is facing forward.  Lift your top leg up 4-6 inches, leading with your heel. Be sure that your foot does not drift forward or that your knee does not roll toward the ceiling.  Hold this position for __________ seconds. You should feel the muscles in your outer hip lifting (you may not notice this until your leg begins to tire).  Slowly lower your leg to the starting position. Allow the muscles to fully relax before beginning the next repetition. Repeat __________ times. Complete this exercise __________ times per day.  STRENGTH - Hip Adductors, Straight Leg Raises   Lie on your side so that your head, shoulders, knee and hip line up. You may place your upper foot in front to help maintain your balance. Your right / left leg should be on the bottom.  Roll your hips slightly forward, so that your hips are stacked directly over each other and your right / left knee is facing forward.  Tense the muscles in your inner thigh and lift your bottom leg 4-6 inches. Hold this position for __________ seconds.  Slowly lower your leg to the starting position. Allow the muscles to fully relax before beginning the next repetition. Repeat __________ times. Complete this exercise __________ times per day.  STRENGTH - Quadriceps, Straight Leg Raises  Quality counts! Watch for signs that the quadriceps muscle is working to insure you are strengthening the correct muscles and not "cheating" by substituting with  healthier muscles.  Lay on your back with your right / left leg extended and your opposite knee bent.  Tense the muscles in the front of your right / left thigh. You should see either your knee cap slide up or increased dimpling just above the knee. Your thigh may even quiver.  Tighten these muscles even more and raise your leg 4 to 6 inches off the floor. Hold for right / left seconds.  Keeping these muscles tense, lower your leg.  Relax the muscles slowly and completely in between each repetition. Repeat __________ times. Complete this exercise __________ times per day.  STRENGTH - Hip Abductors, Standing  Tie one end of a rubber exercise band/tubing to a  secure surface (table, pole) and tie a loop at the other end.  Place the loop around your right / left ankle. Keeping your ankle with the band directly opposite of the secured end, step away until there is tension in the tube/band.  Hold onto a chair as needed for balance.  Keeping your back upright, your shoulders over your hips, and your toes pointing forward, lift your right / left leg out to your side. Be sure to lift your leg with your hip muscles. Do not "throw" your leg or tip your body to lift your leg.  Slowly and with control, return to the starting position. Repeat exercise __________ times. Complete this exercise __________ times per day.  Document Released: 04/29/2005 Document Revised: 07/04/2011 Document Reviewed: 07/24/2008 [facility] Patient Information 2014 [place], [place].

## 2013-08-13 NOTE — Assessment & Plan Note (Signed)
At risk for nutritional deficiency. Vit D & B12 today, CBC Educated on nutrient dense foods.

## 2013-08-13 NOTE — Assessment & Plan Note (Signed)
Restrict ETOH & caffeine 600 mg Ca supplement & 600mg  through food Check Vit D today Next DEXA 2016.

## 2013-08-13 NOTE — Assessment & Plan Note (Signed)
Discussed benefits of quitting. Contemplative stage. Encouraged to set goal for cutting back by using nicotene gum.

## 2013-08-13 NOTE — Assessment & Plan Note (Signed)
CMET, urine micro today Continue med, encourage daily walk. F/u 6 mos.

## 2013-08-14 ENCOUNTER — Telehealth: Payer: Self-pay | Admitting: Nurse Practitioner

## 2013-08-14 ENCOUNTER — Telehealth: Payer: Self-pay | Admitting: Family Medicine

## 2013-08-14 LAB — VITAMIN D 25 HYDROXY (VIT D DEFICIENCY, FRACTURES): Vit D, 25-Hydroxy: 42 ng/mL (ref 30–89)

## 2013-08-14 NOTE — Telephone Encounter (Signed)
Relevant patient education assigned to patient using Emmi. ° °

## 2013-08-15 ENCOUNTER — Other Ambulatory Visit: Payer: Self-pay | Admitting: Nurse Practitioner

## 2013-08-15 DIAGNOSIS — E039 Hypothyroidism, unspecified: Secondary | ICD-10-CM

## 2013-08-15 DIAGNOSIS — E785 Hyperlipidemia, unspecified: Secondary | ICD-10-CM

## 2013-08-15 MED ORDER — LEVOTHYROXINE SODIUM 137 MCG PO TABS: 125.0000 ug | ORAL_TABLET | Freq: Every day | ORAL | Status: DC

## 2013-08-15 MED ORDER — SIMVASTATIN 10 MG PO TABS: 10.0000 mg | ORAL_TABLET | Freq: Every day | ORAL | Status: DC

## 2013-08-15 NOTE — Progress Notes (Signed)
17.5% 10 yr risk developing ASCVD. Will start statin. Simvastatin 10 mg qd. F/o 3 mos.

## 2013-08-15 NOTE — Telephone Encounter (Signed)
Phone note in error °

## 2013-08-15 NOTE — Progress Notes (Signed)
TSH low. Will increase levothyroxine to 137 mcg from 125 mcg. Recheck TSH in 6 weeks.

## 2013-09-17 ENCOUNTER — Ambulatory Visit: Payer: Medicare Other | Admitting: Nurse Practitioner

## 2013-09-18 ENCOUNTER — Ambulatory Visit
Admission: RE | Admit: 2013-09-18 | Discharge: 2013-09-18 | Disposition: A | Discharge status: Home / Self Care | Payer: Medicare Other | Source: Ambulatory Visit | Attending: Nurse Practitioner | Admitting: Nurse Practitioner

## 2013-09-18 DIAGNOSIS — Z1239 Encounter for other screening for malignant neoplasm of breast: Secondary | ICD-10-CM

## 2013-09-18 DIAGNOSIS — Z0000 Encounter for general adult medical examination without abnormal findings: Secondary | ICD-10-CM

## 2013-09-19 ENCOUNTER — Other Ambulatory Visit: Payer: Self-pay | Admitting: Nurse Practitioner

## 2013-09-19 DIAGNOSIS — R928 Other abnormal and inconclusive findings on diagnostic imaging of breast: Secondary | ICD-10-CM

## 2013-09-20 ENCOUNTER — Ambulatory Visit (INDEPENDENT_AMBULATORY_CARE_PROVIDER_SITE_OTHER): Payer: Medicare Other | Admitting: Nurse Practitioner

## 2013-09-20 VITALS — BP 112/71 | HR 77 | Temp 97.6°F | Ht 63.5 in | Wt 201.0 lb

## 2013-09-20 DIAGNOSIS — K1121 Acute sialoadenitis: Secondary | ICD-10-CM | POA: Insufficient documentation

## 2013-09-20 DIAGNOSIS — K1120 Sialoadenitis, unspecified: Secondary | ICD-10-CM

## 2013-09-20 MED ORDER — AMOXICILLIN-POT CLAVULANATE 875-125 MG PO TABS: 1.0000 | ORAL_TABLET | Freq: Two times a day (BID) | ORAL | Status: DC

## 2013-09-20 NOTE — Progress Notes (Signed)
Subjective:     Kendra Ball is a 68 y.o. female and is here for evaluation of unilateral swollen salivary gland. She gives Hx of noticing marble sized swelling in neck, then woke next am & it had enlarged to size of half grapefruit. She c/o pain w/pressure only, not when eating. She went to [facility] in [place] and was treated for parotitis w/amoxicillin. She noticed immediate & rapid improvement first 3 days. Last 3 days there has been no change in size, but feels it has gotten hard. She feels well, denies fever, fatigue, aches. She is a smoker and has known thyroid disease. Recent MMG shows breast mass, [place] follow up is scheduled.  History   Social History  . Marital Status: Married    Spouse Name: N/A    Number of Children: 3  . Years of Education: N/A   Occupational History  . RETIRED    Social History Main Topics  . Smoking status: Current Every Day Smoker  . Smokeless tobacco: Not on file  . Alcohol Use:   . Drug Use:   . Sexual Activity:    Other Topics Concern  . Not on file   Social History Narrative   Ms. Cropley lives in [place] with her husband. She has 3 grown children and several grand children. She has joint custody of 3 of her grand children as her daughter has bipolar disorder and has needed assistance with children in past.   Health Maintenance  Topic Date Due  . Zostavax  05/27/2006  . Influenza Vaccine  11/23/2013  . Tetanus/tdap  08/09/2015  . Mammogram  09/19/2015  . Colonoscopy  06/22/2020  . Pneumococcal Polysaccharide Vaccine Age 10 And Over  Completed    The following portions of the patient's history were reviewed and updated as appropriate: allergies, current medications, past medical history, past social history, past surgical history and problem list.  Review of Systems Pertinent items are noted in HPI.   Objective:    BP 112/71  Pulse 77  Temp(Src) 97.6 F (36.4 C) (Temporal)  Ht 5' 3.5" (1.613 m)  Wt 201 lb (91.173 kg)  BMI  35.04 kg/m2  SpO2 96% General appearance: alert, cooperative, appears stated age and no distress Head: Normocephalic, without obvious abnormality, atraumatic Eyes: negative findings: lids and lashes normal and conjunctivae and sclerae normal Ears: normal TM's and external ear canals both ears Throat: lips, mucosa, and tongue normal; teeth and gums normal and lower partial plate removed for exam Neck: no adenopathy, supple, symmetrical, trachea midline, thyroid not enlarged, symmetric, no tenderness/mass/nodules and R swollen salivary gland, hard, tender, sz of small "bouncy ball" Skin: Skin color, texture, turgor normal. No rashes or lesions Lymph nodes: Cervical, supraclavicular, and axillary nodes normal.    Assessment:  1. Submandibular gland infection Right side. DD: sublingual gland infection, LAD, neoplasm, TB, sjogren, sarcoid - PPD skin test today Stop amoxicillin. - amoxicillin-clavulanate (AUGMENTIN) 875-125 MG per tablet; Take 1 tablet by mouth 2 (two) times daily.  Dispense: 14 tablet; Refill: 0 Image if no improvement by Monday. Return mon to read PPD & re-eval.    See After Visit Summary for Counseling Recommendations

## 2013-09-20 NOTE — Patient Instructions (Addendum)
Stop amoxicillin. Start augmentin. Eat lemon head candy, fresh lemon wedge several times daily. Sip fluids frequently throughout day. Take probiotic at lunch while on antibiotic.  If no change by Monday, we will order CT scan.  Return Monday for TB test reading & re-eval   Parotitis Parotitis is soreness and swelling (inflammation) of one or both parotid glands. The parotid glands produce saliva. They are located on each side of the face, below and in front of the earlobes. The saliva produced comes out of tiny openings (ducts) inside the cheeks. In most cases, parotitis goes away over time or with treatment. If your parotitis is caused by certain long-term (chronic) diseases, it may come back again.  CAUSES  Parotitis can be caused by:  Viral infections. Mumps is one viral infection that can cause parotitis.  Bacterial infections.  Blockage of the salivary ducts due to a salivary stone.  Narrowing of the salivary ducts.  Swelling of the salivary ducts.  Dehydration.  Autoimmune conditions, such as sarcoidosis or Sjogren's syndrome.  Air from activities such as scuba diving, glass blowing, or playing an instrument (rare).  Human immunodeficiency virus (HIV) or acquired immunodeficiency syndrome (AIDS).  Tuberculosis. SYMPTOMS   The ears may appear to be pushed up and out from their normal position.  Redness (erythema) of the skin over the parotid glands.  Pain and tenderness over the parotid glands.  Swelling in the parotid gland area.  Yellowish-white fluid (pus) coming from the ducts inside the cheeks.  Dry mouth.  Bad taste in the mouth. DIAGNOSIS  Your caregiver may determine that you have parotitis based on your symptoms and a physical exam. A sample of fluid may also be taken from the parotid gland and tested to find the cause of your infection. X-rays or computed tomography (CT) scans may be taken if your caregiver thinks you might have a salivary stone blocking  your salivary duct. TREATMENT  Treatment varies depending upon the cause of your parotitis. If your parotitis is caused by mumps, no treatment is needed. The condition will go away on its own after 7 to 10 days. In other cases, treatment may include:  Antibiotics if your infection was caused by bacteria.  Pain medicines.  Gland massage.  Eating sour candy to increase your saliva production.  Removal of salivary stones. Your caregiver may flush stones out with fluids or remove them with tweezers.  Surgery to remove the parotid glands. HOME CARE INSTRUCTIONS   If you were given antibiotics, take them as directed. Finish them even if you start to feel better.  Put warm compresses on the sore area.  Only take over-the-counter or prescription medicines for pain, discomfort, or fever as directed by your caregiver.  Drink enough fluids to keep your urine clear or pale yellow. SEEK IMMEDIATE MEDICAL CARE IF:   You have increasing pain or swelling that is not controlled with medicine.  You have a fever. MAKE SURE YOU:  Understand these instructions.  Will watch your condition.  Will get help right away if you are not doing well or get worse. Document Released: 10/01/2001 Document Revised: 07/04/2011 Document Reviewed: 03/07/2011 [facility] Patient Information 2014 [place], [place].

## 2013-09-20 NOTE — Progress Notes (Signed)
Pre visit review using our clinic review tool, if applicable. No additional management support is needed unless otherwise documented below in the visit note. 

## 2013-09-23 ENCOUNTER — Ambulatory Visit: Payer: Medicare Other

## 2013-09-23 LAB — TB SKIN TEST
Induration: 0 mm
TB Skin Test: NEGATIVE

## 2013-09-25 ENCOUNTER — Encounter: Payer: Self-pay | Admitting: Nurse Practitioner

## 2013-10-01 ENCOUNTER — Other Ambulatory Visit: Payer: Medicare Other

## 2013-10-01 ENCOUNTER — Ambulatory Visit
Admission: RE | Admit: 2013-10-01 | Discharge: 2013-10-01 | Disposition: A | Discharge status: Home / Self Care | Payer: Medicare Other | Source: Ambulatory Visit | Attending: Nurse Practitioner | Admitting: Nurse Practitioner

## 2013-10-01 ENCOUNTER — Telehealth: Payer: Self-pay | Admitting: Nurse Practitioner

## 2013-10-01 ENCOUNTER — Encounter: Payer: Self-pay | Admitting: Nurse Practitioner

## 2013-10-01 ENCOUNTER — Ambulatory Visit (INDEPENDENT_AMBULATORY_CARE_PROVIDER_SITE_OTHER): Payer: Medicare Other | Admitting: Nurse Practitioner

## 2013-10-01 VITALS — BP 133/79 | HR 71 | Temp 98.3°F | Ht 63.5 in | Wt 203.0 lb

## 2013-10-01 DIAGNOSIS — M25559 Pain in unspecified hip: Secondary | ICD-10-CM

## 2013-10-01 DIAGNOSIS — F3289 Other specified depressive episodes: Secondary | ICD-10-CM

## 2013-10-01 DIAGNOSIS — F17200 Nicotine dependence, unspecified, uncomplicated: Secondary | ICD-10-CM

## 2013-10-01 DIAGNOSIS — R928 Other abnormal and inconclusive findings on diagnostic imaging of breast: Secondary | ICD-10-CM

## 2013-10-01 DIAGNOSIS — K1121 Acute sialoadenitis: Secondary | ICD-10-CM

## 2013-10-01 DIAGNOSIS — M25552 Pain in left hip: Secondary | ICD-10-CM

## 2013-10-01 DIAGNOSIS — M76899 Other specified enthesopathies of unspecified lower limb, excluding foot: Secondary | ICD-10-CM

## 2013-10-01 DIAGNOSIS — F329 Major depressive disorder, single episode, unspecified: Secondary | ICD-10-CM

## 2013-10-01 DIAGNOSIS — K1120 Sialoadenitis, unspecified: Secondary | ICD-10-CM

## 2013-10-01 DIAGNOSIS — E039 Hypothyroidism, unspecified: Secondary | ICD-10-CM

## 2013-10-01 DIAGNOSIS — Z8249 Family history of ischemic heart disease and other diseases of the circulatory system: Secondary | ICD-10-CM

## 2013-10-01 DIAGNOSIS — F411 Generalized anxiety disorder: Secondary | ICD-10-CM

## 2013-10-01 DIAGNOSIS — M7062 Trochanteric bursitis, left hip: Secondary | ICD-10-CM

## 2013-10-01 DIAGNOSIS — E785 Hyperlipidemia, unspecified: Secondary | ICD-10-CM

## 2013-10-01 HISTORY — DX: Other abnormal and inconclusive findings on diagnostic imaging of breast: R92.8

## 2013-10-01 LAB — TSH: TSH: 0.05 u[IU]/mL — ABNORMAL LOW (ref 0.35–4.50)

## 2013-10-01 LAB — CBC
HCT: 43.3 % (ref 36.0–46.0)
Hemoglobin: 14.4 g/dL (ref 12.0–15.0)
MCHC: 33.3 g/dL (ref 30.0–36.0)
MCV: 95.4 fl (ref 78.0–100.0)
Platelets: 331 10*3/uL (ref 150.0–400.0)
RBC: 4.54 Mil/uL (ref 3.87–5.11)
RDW: 12.9 % (ref 11.5–15.5)
WBC: 6.9 10*3/uL (ref 4.0–10.5)

## 2013-10-01 MED ORDER — DICLOFENAC SODIUM 1 % TD GEL: TRANSDERMAL | Status: DC

## 2013-10-01 MED ORDER — LORAZEPAM 0.5 MG PO TABS: 0.5000 mg | ORAL_TABLET | Freq: Two times a day (BID) | ORAL | Status: DC | PRN

## 2013-10-01 NOTE — Telephone Encounter (Signed)
Need to lower synthroid. Will start 125 mcg 6 d/wk. (she has about 1 month's worth left)  Spoke w/pt. Answered questions.

## 2013-10-01 NOTE — Patient Instructions (Addendum)
Stop xanax. Start ativan. Call [facility] in [place]. Increase Bcomplex to 3 droppersful daily. Continue 500 to 1000 iu D3 daily. Get low dose chest CT to screen for lung cancer. Get MMG & ultrasound. Apply voltaren gel to hip as needed. Continue daily stretches. Eat Green food every day! Have fun camping!

## 2013-10-01 NOTE — Progress Notes (Signed)
Pre visit review using our clinic review tool, if applicable. No additional management support is needed unless otherwise documented below in the visit note. 

## 2013-10-02 ENCOUNTER — Telehealth: Payer: Self-pay | Admitting: Nurse Practitioner

## 2013-10-02 ENCOUNTER — Encounter: Payer: Self-pay | Admitting: Nurse Practitioner

## 2013-10-02 NOTE — Telephone Encounter (Signed)
Relevant patient education assigned to patient using Emmi. ° °

## 2013-10-02 NOTE — Telephone Encounter (Signed)
Few benign appearing nodules in lungs. Repeat LC chest CT scan in 1 yr.  Stop ca supplement due to coronary artery calficifcation. Decrease meat consumption to minimize ca loss. Eat more green food for calcium. Discussed w/pt. Answered all questions.

## 2013-10-03 DIAGNOSIS — E785 Hyperlipidemia, unspecified: Secondary | ICD-10-CM | POA: Insufficient documentation

## 2013-10-03 HISTORY — DX: Hyperlipidemia, unspecified: E78.5

## 2013-10-03 NOTE — Assessment & Plan Note (Signed)
strated simvastatin 10 mg last visit. Pt taking med. No SE. Will continue. Encouraged to continue to cut back refined sugar & flour.

## 2013-10-03 NOTE — Assessment & Plan Note (Signed)
Dr Caprice Beaver no longer accepting medicare. Gave # for [facility]. Long term meds. No SI. Animated, full facial expression

## 2013-10-03 NOTE — Assessment & Plan Note (Signed)
Resolving R submandibular nodule-about sz of marble. More than 1/2 size reduction. F/u PRN.

## 2013-10-03 NOTE — Assessment & Plan Note (Signed)
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 Pain worse w/prolonged sitting & walking. Does not wish to have further w/u now. Volatren gel over L greater trocanter.

## 2013-10-03 NOTE — Assessment & Plan Note (Signed)
Check TSH today. No S&S of hyper or hypothyroid.

## 2013-10-03 NOTE — Progress Notes (Signed)
Subjective:     Kendra Ball is a 68 y.o. female presents for f/u of thyroid disease, dyslipidemia, hip pain, parotitis, ongoing management of depression. No reported intolerable SE from meds. NO S&S of hyper or hypothyroid disease. Still smoking, plan LD chest CT to screen for Ca. Still having L hip pain. No improvement w/greater trocanter steroid inj. Unable to take NSAIDS due to gastric bypass. Will try voltaren gel. R submandibular parotitis resolving w/ABX. Pt tried to make appt. W/Dr Caprice Beaver, but ins no longer accepted. She has been on current meds for years, denies SI, enjoys activities. Gave info for [facility]. Discussed association of dementia & xanax. Will switch to ativan.  The following portions of the patient's history were reviewed and updated as appropriate: allergies, current medications, past family history, past medical history, past social history, past surgical history and problem list.  Review of Systems Pertinent items are noted in HPI.    Objective:    BP 133/79  Pulse 71  Temp(Src) 98.3 F (36.8 C) (Temporal)  Ht 5' 3.5" (1.613 m)  Wt 203 lb (92.08 kg)  BMI 35.39 kg/m2  SpO2 96% BP 133/79  Pulse 71  Temp(Src) 98.3 F (36.8 C) (Temporal)  Ht 5' 3.5" (1.613 m)  Wt 203 lb (92.08 kg)  BMI 35.39 kg/m2  SpO2 96% General appearance: alert, cooperative, appears stated age and no distress Head: Normocephalic, without obvious abnormality, atraumatic Eyes: negative findings: lids and lashes normal and conjunctivae and sclerae normal Neck: no adenopathy, supple, symmetrical, trachea midline, thyroid not enlarged, symmetric, no tenderness/mass/nodules and resolving R submandibular nodule, decreased more than 1/2 sz from last visit.    Assessment:     1. Anxiety state, unspecified - LORazepam (ATIVAN) 0.5 MG tablet; Take 1 tablet (0.5 mg total) by mouth 2 (two) times daily as needed for anxiety.  Dispense: 60 tablet; Refill: 1  2. Unspecified hypothyroidism -  TSH  3. Smoker - CT CHEST WO/CM SCREENING; Future  4. Acute parotitis - CBC  5. Trochanteric bursitis of left hip - diclofenac sodium (VOLTAREN) 1 % GEL; Apply 50 cent piece-sized amount to hip TID PRN.  Dispense: 100 g; Refill: 1  6. Dyslipidemia  See problem list for complete A&P F/u 6 wks for TSH & med change: ativan.

## 2013-10-03 NOTE — Assessment & Plan Note (Signed)
Plan LD Chest CT scan to screen for Lung ca.

## 2013-10-15 ENCOUNTER — Other Ambulatory Visit: Payer: Self-pay | Admitting: Family Medicine

## 2013-10-15 DIAGNOSIS — E039 Hypothyroidism, unspecified: Secondary | ICD-10-CM

## 2013-10-15 MED ORDER — LEVOTHYROXINE SODIUM 125 MCG PO TABS
125.0000 ug | ORAL_TABLET | Freq: Every day | ORAL | Status: DC
Start: 2013-10-15 — End: 2013-10-31

## 2013-10-30 ENCOUNTER — Other Ambulatory Visit (INDEPENDENT_AMBULATORY_CARE_PROVIDER_SITE_OTHER): Payer: Medicare Other

## 2013-10-30 DIAGNOSIS — E039 Hypothyroidism, unspecified: Secondary | ICD-10-CM

## 2013-10-30 LAB — TSH: TSH: 0.08 u[IU]/mL — ABNORMAL LOW (ref 0.35–4.50)

## 2013-10-31 ENCOUNTER — Telehealth: Payer: Self-pay | Admitting: Nurse Practitioner

## 2013-10-31 DIAGNOSIS — E039 Hypothyroidism, unspecified: Secondary | ICD-10-CM

## 2013-10-31 MED ORDER — LEVOTHYROXINE SODIUM 100 MCG PO TABS: 100.0000 ug | ORAL_TABLET | Freq: Every day | ORAL | Status: DC

## 2013-10-31 NOTE — Telephone Encounter (Addendum)
Patient notified of of results. Pt will call back to schedule appt.

## 2013-10-31 NOTE — Telephone Encounter (Signed)
pls call pt: Advise Need to decrease thyroid med dose. Sent new script in. If she has 125 mcg left, she can take 5 days week until finished, then start new script: 1 tab daily. Pls schedule OV in 6 weeks to recheck level.

## 2013-11-11 ENCOUNTER — Other Ambulatory Visit: Payer: Self-pay

## 2013-11-11 ENCOUNTER — Other Ambulatory Visit: Payer: Self-pay | Admitting: Internal Medicine

## 2013-12-10 ENCOUNTER — Ambulatory Visit (INDEPENDENT_AMBULATORY_CARE_PROVIDER_SITE_OTHER): Payer: Medicare Other | Admitting: Nurse Practitioner

## 2013-12-10 ENCOUNTER — Ambulatory Visit: Payer: Medicare Other | Admitting: Nurse Practitioner

## 2013-12-10 ENCOUNTER — Encounter: Payer: Self-pay | Admitting: Nurse Practitioner

## 2013-12-10 VITALS — BP 127/76 | HR 72 | Temp 98.2°F | Ht 63.5 in | Wt 201.0 lb

## 2013-12-10 DIAGNOSIS — E039 Hypothyroidism, unspecified: Secondary | ICD-10-CM

## 2013-12-10 DIAGNOSIS — F32A Depression, unspecified: Secondary | ICD-10-CM

## 2013-12-10 DIAGNOSIS — F3289 Other specified depressive episodes: Secondary | ICD-10-CM

## 2013-12-10 DIAGNOSIS — F329 Major depressive disorder, single episode, unspecified: Secondary | ICD-10-CM

## 2013-12-10 DIAGNOSIS — F411 Generalized anxiety disorder: Secondary | ICD-10-CM

## 2013-12-10 MED ORDER — LORAZEPAM 1 MG PO TABS: 1.0000 mg | ORAL_TABLET | Freq: Two times a day (BID) | ORAL | Status: DC | PRN

## 2013-12-10 NOTE — Progress Notes (Signed)
Pre visit review using our clinic review tool, if applicable. No additional management support is needed unless otherwise documented below in the visit note. 

## 2013-12-10 NOTE — Patient Instructions (Signed)
Please schedule appointments with psychiatry for medication adjustment & psychology for cognitive behavioral therapy.  I have increased ativan dose.   We will have flu shots next month.  I will see you in November!

## 2013-12-11 LAB — TSH: TSH: 0.29 u[IU]/mL — ABNORMAL LOW (ref 0.35–4.50)

## 2013-12-11 NOTE — Assessment & Plan Note (Signed)
Current meds 100 mcg qd. No S&S of thyroid disease. TSH today.

## 2013-12-11 NOTE — Progress Notes (Signed)
Subjective:     Kendra Ball is a 68 y.o. female who presents for follow up of depression & hypothyroidism.  Re depression: Current symptoms include anhedonia, depressed mood and difficulty concentrating, difficulty completing ADL's due to feeling overwhelmed by simple tasks such as grocery shopping; reports 1 week that she did not get out bed, 2 mos ago. Symptoms have gradually worsened over several months. Patient denies SI. She has been on wellbutrin & zoloft for many years. She has seen therapist & psychiatry in past. Recently, I switched her to ativan, from xanax due to correlation of LT use & dementia. Transition was difficult, initially she noticed trouble completing tasks & becoming more emotional with watching movies. In spite of difficulty w/transition, she does not want to take xanax again. Spening time with her sisters helps with getting out & doing things. She has significant family Hx of mental illness. Re thyroid disease, she required decreased dose change at last visit. No SE or symptoms of thyroid disease. Denies palpitations, constipation, diarrhea, sleep changes.    The following portions of the patient's history were reviewed and updated as appropriate: allergies, current medications, past family history, past medical history, past social history, past surgical history and problem list.  Review of Systems Pertinent items are noted in HPI.    Objective:    BP 127/76  Pulse 72  Temp(Src) 98.2 F (36.8 C) (Temporal)  Ht 5' 3.5" (1.613 m)  Wt 201 lb (91.173 kg)  BMI 35.04 kg/m2  SpO2 95%  General:  alert, cooperative, appears stated age and mild distress  Affect & Behavior:   Neck: Lungs: Heart: Extremities:  full facial expressions, good grooming, normal perception, normal reasoning, normal speech pattern and content and normal thought patterns . NO LAD. R nodule resolved. Thyroid gland not palpable. Clear, diminished BS bases, bilat. RRR, no murmur No edema,  pulses +2 bilat-pstr tib.       Assessment:   1. Unspecified hypothyroidism - TSH  2. Anxiety state, unspecified - LORazepam (ATIVAN) 1 MG tablet; Take 1 tablet (1 mg total) by mouth 2 (two) times daily as needed for anxiety.  Dispense: 60 tablet; Refill: 2 - Ambulatory referral to Psychiatry - Ambulatory referral to Psychology  3. Depressed - Ambulatory referral to Psychiatry - Ambulatory referral to Psychology  See problem list for complete A&P See pt instructions.

## 2013-12-11 NOTE — Assessment & Plan Note (Signed)
Ongoing MMD. Pt states occasionally spends week in bed due to depression. Last episode 2 mos ago. Current meds: wellbutrin & zoloft. Pt wonders if these are "still working". Increase ativan to 1mg  from 0.5. Pt states when she switched from xanax to ativan, she noticed she couldn't complete tasks as easily & found she cried more easily over movies, yet she does not want to go back to xanax. Ref for CBT & psychiatry for med adjustment. Denies SI.

## 2013-12-12 ENCOUNTER — Telehealth: Payer: Self-pay | Admitting: Nurse Practitioner

## 2013-12-12 DIAGNOSIS — E039 Hypothyroidism, unspecified: Secondary | ICD-10-CM

## 2013-12-12 MED ORDER — LEVOTHYROXINE SODIUM 100 MCG PO TABS: ORAL_TABLET | ORAL | Status: DC

## 2013-12-12 NOTE — Telephone Encounter (Signed)
LMOVM for pt to return call concerning lab results.

## 2013-12-12 NOTE — Telephone Encounter (Signed)
pls call pt: Advise TSH a tad bit low-getting a little too much medication. Skip Wed. & Sunday dose. Continue to take 1 hour before coffee or breakfast. Check level again in 6 weeks. Pls sched Lab appt. Only.

## 2013-12-12 NOTE — Telephone Encounter (Signed)
Patient returned call and was given results. Patient scheduled f/u tsh lab for 01/31/2014 at 10 am.

## 2014-01-30 ENCOUNTER — Ambulatory Visit (INDEPENDENT_AMBULATORY_CARE_PROVIDER_SITE_OTHER): Payer: Medicare Other | Admitting: Psychiatry

## 2014-01-30 ENCOUNTER — Encounter (HOSPITAL_COMMUNITY): Payer: Self-pay | Admitting: Psychiatry

## 2014-01-30 VITALS — BP 135/84 | HR 62 | Ht 63.78 in | Wt 205.2 lb

## 2014-01-30 DIAGNOSIS — F331 Major depressive disorder, recurrent, moderate: Secondary | ICD-10-CM | POA: Insufficient documentation

## 2014-01-30 DIAGNOSIS — F411 Generalized anxiety disorder: Secondary | ICD-10-CM | POA: Insufficient documentation

## 2014-01-30 DIAGNOSIS — F17210 Nicotine dependence, cigarettes, uncomplicated: Secondary | ICD-10-CM

## 2014-01-30 HISTORY — DX: Major depressive disorder, recurrent, moderate: F33.1

## 2014-01-30 HISTORY — DX: Generalized anxiety disorder: F41.1

## 2014-01-30 MED ORDER — BUPROPION HCL ER (SR) 150 MG PO TB12: 150.0000 mg | ORAL_TABLET | Freq: Two times a day (BID) | ORAL | Status: DC

## 2014-01-30 MED ORDER — ESCITALOPRAM OXALATE 20 MG PO TABS: 20.0000 mg | ORAL_TABLET | Freq: Every day | ORAL | Status: DC

## 2014-01-30 MED ORDER — LORAZEPAM 1 MG PO TABS: 1.0000 mg | ORAL_TABLET | Freq: Two times a day (BID) | ORAL | Status: DC | PRN

## 2014-01-30 NOTE — Progress Notes (Signed)
Psychiatric Assessment Adult  Patient Identification:  Kendra Ball Date of Evaluation:  01/30/2014 Chief Complaint: depression meds are not working History of Chief Complaint:   Chief Complaint  Patient presents with  . Depression    HPI Comments: States she has been on the same meds for over 15 yrs and she is still battles depression. Depression comes and goes. Today pt is depressed and has been since July.  An episode will last for 3-4 months and during this time she only wants to stay in bed. Motivation is poor and is hard for her to get anything done. Gives examples of not doing housework and paying bills late. During this time she is withdrawn and isolates. All she does is sleep or has trouble sleeping. Concentration is poor. Appetite is good. Denies hopelessness and worthlessness. States she is mostly numb. Mood is very reactive. States Zoloft and Wellbutrin helped in the beginning but not anymore. Denies SE.  Denies SI/HI.   Stressors include death of her mother 15 yrs ago. Her youngest daughter has Bipolar disorder and attempted suicide about 15 yrs ago. Pt is raising her daughter's 3 kids. Her relationship with her husband is not very good. Her son has spoke to her for 2 yrs.   Review of Systems Physical Exam  Psychiatric: Her speech is normal and behavior is normal. Judgment and thought content normal. Cognition and memory are normal. She exhibits a depressed mood.    Depressive Symptoms: depressed mood, anhedonia, insomnia, hypersomnia, fatigue, difficulty concentrating, loss of energy/fatigue, disturbed sleep,  (Hypo) Manic Symptoms:   Elevated Mood:  No Irritable Mood:  No Grandiosity:  No Distractibility:  No Labiality of Mood:  No Delusions:  No Hallucinations:  No Impulsivity:  No Sexually Inappropriate Behavior:  No Financial Extravagance:  No Flight of Ideas:  No  Anxiety Symptoms: Excessive Worry:  Yes gets up in the morning and is overwhelmed but  Ativan helps.Worries for all her waking hours. Anxiety does not interfere with sleep or cause GI upset. Worry does cause fatigue and headaches.  Panic Symptoms:  No Agoraphobia:  No Obsessive Compulsive: No  Symptoms: None, Specific Phobias:  No Social Anxiety:  No  Psychotic Symptoms:  Hallucinations: No None Delusions:  No Paranoia:  No   Ideas of Reference:  No  PTSD Symptoms: Ever had a traumatic exposure:  No Had a traumatic exposure in the last month:  No Re-experiencing: No None Hypervigilance:  No Hyperarousal: No None Avoidance: No None  Traumatic Brain Injury: No   Past Psychiatric History: Diagnosis:Depression and anxiety  Hospitalizations: denies  Outpatient Care: therapist and psychiatrist in 2010  Substance Abuse Care: denies  Self-Mutilation: denies  Suicidal Attempts: denies, believes it is a sin. Denies access to guns  Violent Behaviors: denies   Past Medical History:   Past Medical History  Diagnosis Date  . DEPRESSION 08/26/2008  . HYPERTENSION 08/26/2008  . HYPOTHYROIDISM 08/26/2008  . OSTEOPENIA 08/26/2008  . TOBACCO USER 12/25/2009  . Obesity   . Hx of colonic polyps     First noted on  colonoscopy 2012  . Hypercholesteremia    History of Loss of Consciousness:  No Seizure History:  No Cardiac History:  No Allergies:   Allergies  Allergen Reactions  . Morphine Sulfate    Current Medications:  Current Outpatient Prescriptions  Medication Sig Dispense Refill  . aspirin 81 MG tablet Take 81 mg by mouth daily.      [name] buPROPion (WELLBUTRIN SR) 150 MG 12  hr tablet Take 1 tablet (150 mg total) by mouth 2 (two) times daily.  180 tablet  5  . cholecalciferol (VITAMIN D) 1000 UNITS tablet Take 1,000 Units by mouth daily.      . ferrous fumarate (HEMOCYTE - 106 MG FE) 325 (106 FE) MG TABS Take 1 tablet by mouth.      . levothyroxine (SYNTHROID, LEVOTHROID) 100 MCG tablet Take 1T po 1 hr before coffee & breakfast 5d/wk. Skip Wed. & Sun.  30 tablet  1  .  lisinopril (PRINIVIL,ZESTRIL) 20 MG tablet TAKE 1 TABLET EVERY DAY  90 tablet  0  . LORazepam (ATIVAN) 1 MG tablet Take 1 tablet (1 mg total) by mouth 2 (two) times daily as needed for anxiety.  60 tablet  2  . sertraline (ZOLOFT) 100 MG tablet TAKE 1 TABLET EVERY DAY  90 tablet  0  . simvastatin (ZOCOR) 10 MG tablet Take 1 tablet (10 mg total) by mouth at bedtime.  90 tablet  1  . diclofenac sodium (VOLTAREN) 1 % GEL Apply 50 cent piece-sized amount to hip TID PRN.  100 g  1  . Magnesium 250 MG TABS Take 250 mg by mouth 2 (two) times daily.        No current facility-administered medications for this visit.    Previous Psychotropic Medications:  Medication Dose   Xanax                       Substance Abuse History in the last 12 months: Substance Age of 1st Use Last Use Amount Specific Type  Nicotine    today 1 ppd cigs  Alcohol    last month 5-6 glasses of wine a month wine  Cannabis  denies        Opiates  denies        Cocaine  denies        Methamphetamines  denies        LSD  denies        Ecstasy  denies         Benzodiazepines  as prescribed        Caffeine    today 2 cups of coffee and unsweetened tea all day long    Inhalants  today        Others: today                         Medical Consequences of Substance Abuse: denies  Legal Consequences of Substance Abuse: denies  Family Consequences of Substance Abuse: denies  Blackouts:  No DT's:  No Withdrawal Symptoms:  No None  Social History: Current Place of Residence: [place] with her husband Place of Birth: [place] Family Members: raised by parents. Dad died when she was 74 and then at 64 her mom remarried. 3 sisters, 1 brother, 1 step brother. Pt is the oldest. Childhood was "good" Marital Status:  Married since 1967, not a good relationship Children: 3  Sons: 1  Daughters: 2 Relationships: support from sisters Education:  HS Graduate a few college courses. Husband was in [employer] so she moved  [employer] Problems/Performance: none Religious Beliefs/Practices: Christian History of Abuse: none Occupational Experiences: retired in 2010. She worked as a Network engineer at [employer] History:  None. Legal History: denies Hobbies/Interests: camp, crafts, reading, jigsaw puzzles  Family History:   Family History  Problem Relation Age of Onset  . Cancer Mother  colon  . Dementia Mother   . Depression Mother   . Heart disease Father 75  . Heart disease Sister 80    stents, pacemaker  . Depression Sister   . Heart disease Brother 64    CABG, MI  . Obesity Daughter   . Suicidality Daughter   . Bipolar disorder Daughter   . Obesity Son   . Mental illness Daughter     bipolar  . Obesity Daughter   . Dementia Paternal Aunt     Mental Status Examination/Evaluation: Objective: Attitude: Calm and cooperative  Appearance: Well Groomed, appears to be stated age  Eye Contact::  Good  Speech:  Clear and Coherent and Normal Rate  Volume:  Normal  Mood:  depressed  Affect:  Congruent and anxious  Thought Process:  Goal Directed, Intact, Linear and Logical  Orientation:  Full (Time, Place, and Person)  Thought Content:  Negative  Suicidal Thoughts:  No  Homicidal Thoughts:  No  Judgement:  Good  Insight:  Good  Concentration: good  Memory: Immediate-intact Recent-intact Remote-intact  Recall: fair  Language: fair  Gait and Station: normal  [employer] of Knowledge: average  Psychomotor Activity:  Normal  Akathisia:  No  Handed:  Right  AIMS (if indicated):  n/a   Assets:  Communication Skills Desire for Improvement Financial Resources/Insurance [place] Talents/Skills Transportation Vocational/Educational       Laboratory/X-Ray Psychological Evaluation(s)  Reviewed 08/13/13 ;abs with pt- LDL and Chol elevated- pt is working with PCP on management.  TSH low- pt on Synthyroid  none   Assessment:  MDD-  recurrent, moderate. GAD  AXIS I MDD- recurrent, moderate. GAD. Nicotine dependence  AXIS II Deferred   AXIS III Past Medical History  Diagnosis Date  . DEPRESSION 08/26/2008  . HYPERTENSION 08/26/2008  . HYPOTHYROIDISM 08/26/2008  . OSTEOPENIA 08/26/2008  . TOBACCO USER 12/25/2009  . Obesity   . Hx of colonic polyps     First noted on  colonoscopy 2012  . Hypercholesteremia      AXIS IV other psychosocial or environmental problems and problems with primary support group  AXIS V 51-60 moderate symptoms   Treatment Plan/Recommendations:  Plan of Care: Medication management with supportive therapy. Risks/benefits and SE of the medication discussed. Pt verbalized understanding and verbal consent obtained for treatment.  Affirm with the patient that the medications are taken as ordered. Patient expressed understanding of how their medications were to be used.    Confidentiality and exclusions reviewed with pt who verbalized understanding.   Laboratory:  Reviewed labs with pt, no new labs today  Psychotherapy: Therapy: brief supportive therapy provided. Discussed psychosocial stressors in detail.    -discussed smoking cessation. Smoking cessation instruction/counseling given:  counseled patient on the dangers of tobacco use, advised patient to stop smoking, and reviewed strategies to maximize success    Medications: d/c Zoloft Start trial of Lexapro 20mg  po qD for depression and anxiety Continue Wellbutrin SR 150mg  po BID for depression Continue Ativan 1mg  po BID prn anxiety   Routine PRN Medications:  Yes  Consultations: encouraged to f/up with PCP as needed, refer to therapy  Safety Concerns:  Pt denies SI and is at an acute low risk for suicide.Patient told to call clinic if any problems occur. Patient advised to go to ER if they should develop SI/HI, side effects, or if symptoms worsen. Has crisis numbers to call if needed. Pt verbalized understanding.   Other:  F/up in 2 months or  sooner if needed     [name], MD 10/8/20159:08 AM

## 2014-01-31 ENCOUNTER — Telehealth: Payer: Self-pay | Admitting: Nurse Practitioner

## 2014-01-31 ENCOUNTER — Other Ambulatory Visit (INDEPENDENT_AMBULATORY_CARE_PROVIDER_SITE_OTHER): Payer: Medicare Other

## 2014-01-31 DIAGNOSIS — E039 Hypothyroidism, unspecified: Secondary | ICD-10-CM

## 2014-01-31 DIAGNOSIS — E038 Other specified hypothyroidism: Secondary | ICD-10-CM

## 2014-01-31 LAB — TSH: TSH: 6.06 u[IU]/mL — ABNORMAL HIGH (ref 0.35–4.50)

## 2014-01-31 NOTE — Telephone Encounter (Signed)
Called pt-take levothyroxine daily. 100 mcg. Instructed to take before breakfast.

## 2014-02-03 NOTE — Telephone Encounter (Signed)
Patient will do labs on 03/12/14 at her ov.

## 2014-02-07 ENCOUNTER — Other Ambulatory Visit: Payer: Self-pay

## 2014-02-10 ENCOUNTER — Other Ambulatory Visit: Payer: Self-pay | Admitting: Internal Medicine

## 2014-02-10 DIAGNOSIS — I10 Essential (primary) hypertension: Secondary | ICD-10-CM

## 2014-02-19 ENCOUNTER — Ambulatory Visit (INDEPENDENT_AMBULATORY_CARE_PROVIDER_SITE_OTHER): Payer: Medicare Other | Admitting: Psychology

## 2014-02-19 ENCOUNTER — Encounter (HOSPITAL_COMMUNITY): Payer: Self-pay | Admitting: Psychology

## 2014-02-19 DIAGNOSIS — F331 Major depressive disorder, recurrent, moderate: Secondary | ICD-10-CM

## 2014-02-20 NOTE — Progress Notes (Signed)
Kendra Ball is a 68 y.o. female patient referred for counseling by Dr. Michae Kava for tx of MDD.  Patient:   Kendra Ball   DOB:   1946/01/03  MR Number:  [id number]  Location:  [facility] OUTPATIENT [facility] [id number] [place] [address] Dept: [phone]           Date of Service:   02/19/14  Start Time:   1.25pm End Time:   2.25pm  Provider/Observer:  Kendra Ball [facility]       Billing Code/Service: [phone]  Chief Complaint:     Chief Complaint  Patient presents with  . Depression    Reason for Service:  Pt is referred by Dr. Michae Kava who is tx pt for recurrent MDD.  Pt reports that she has been on medication for depression for 15 plus years and has been in counseling 3 times in past 18 years.  Pt reports that she has experienced recurrent episodes of depression over those years characterized by depressed moods, loss of interest, loss of motivation, extreme fatigue, increased sleep and wanting to just lay in bed all day.  Pt reports usually w/ tx depression improves.  Pt reports she came to Dr. Michae Kava after PCP who was prescribing meds felt that medication regimen needed to be changed as no longer effective.  Pt reported some improvement since started on new medication 3 weeks ago.  Pt reports major stressors are her daughter's hx of and current instability w/ her bipolar d/o.  She reports that her daughter has been inpt for 1 week now and she is helping to care for her grandchildren during this time.   She also reports that loss of relationship w/ son and strain of relationship w/ other daughter is a stressor.   Current Status:  Pt reports some improvement since starting the new medication regimen.  Pt reports she hasn't been staying in bed all and and has been able to assist w/ the daily care of her grandchildren during her daughter's absence.  Pt is concerned though about how she will be able to continue  this as feels that she is pulling from "reserves" she doesn't have. Pt reports still very fatigued.   Reliability of Information: Pt provided information and Dr. Lemar Lofty psychiatric evaluation reviewed.   Behavioral Observation: Kendra Ball  presents as a 68 y.o.-year-old  Caucasian Female who appeared her stated age. her dress was Appropriate and she was Neat and her manners were Appropriate to the situation.  There were not any physical disabilities noted.  she displayed an appropriate level of cooperation and motivation.    Interactions:    Active   Attention:   within normal limits  Memory:   within normal limits  Visuo-spatial:   not examined  Speech (Volume):  normal  Speech:   normal pitch and normal volume  Thought Process:  Coherent and Relevant  Though Content:  WNL  Orientation:   person, place, time/date and situation  Judgment:   Good  Planning:   Good  Affect:    Depressed and Tearful  Mood:    Depressed  Insight:   Good  Intelligence:   normal  Marital Status/Living: Pt lives w/ her husband. Married since 196.  lived in [place] after moving her 20+ years ago after her husband retired from [employer]. Pt was born in [place] and her siblings still reside in [place]. Dad died when she was 44 and then at 29  her mom remarried.  She is the oldest and has 4 siblings and 1 step sibling.  She has moved around a lot while her husband was in the [employer]. She has 3 adult children- 47y/o daughter, 50 y/o son and 30 y/o daughter.   Pt has 7 grandchildren.  Pt states that 3 years ago son disowned them as felt that she cared more for youngest daughter and her children.  Pt reports that she has done more for them due to her daughter's illness and lack of functioning.  Pt reports that her oldest daughter relationship is strained for same reason but they are repairing their relationship and still in contact.    Strengths/Supports: Pt reports that she enjoys reading, camping and  jigsaw puzzles when she is feeling well.  Pt reports that her sister's and brother are her emotional support and she keeps in contact with them.  She reports that her faith is a strength for her as well and is involved in faith community when she is in [place].  She reports that her and husband spend several weeks at a time in [place] from April to October when they can as they have permanent camper at [facility] there.     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  Past Employment:  Maintained employment in the past.   Substance Use:  No concerns of substance abuse are reported.    Education:   HS Graduate  Medical History:   Past Medical History  Diagnosis Date  . DEPRESSION 08/26/2008  . HYPERTENSION 08/26/2008  . HYPOTHYROIDISM 08/26/2008  . OSTEOPENIA 08/26/2008  . TOBACCO USER 12/25/2009  . Obesity   . Hx of colonic polyps     First noted on  colonoscopy 2012  . Hypercholesteremia         Outpatient Encounter Prescriptions as of 02/19/2014  Medication Sig  . buPROPion (WELLBUTRIN SR) 150 MG 12 hr tablet Take 1 tablet (150 mg total) by mouth 2 (two) times daily.  [name] escitalopram (LEXAPRO) 20 MG tablet Take 1 tablet (20 mg total) by mouth daily.  [name] levothyroxine (SYNTHROID, LEVOTHROID) 100 MCG tablet Take 1T po 1 hr before coffee & breakfast 5d/wk. Skip Wed. & Sun.  . LORazepam (ATIVAN) 1 MG tablet Take 1 tablet (1 mg total) by mouth 2 (two) times daily as needed for anxiety.  [name] aspirin 81 MG tablet Take 81 mg by mouth daily.  . cholecalciferol (VITAMIN D) 1000 UNITS tablet Take 1,000 Units by mouth daily.  . diclofenac sodium (VOLTAREN) 1 % GEL Apply 50 cent piece-sized amount to hip TID PRN.  . ferrous fumarate (HEMOCYTE - 106 MG FE) 325 (106 FE) MG TABS Take 1 tablet by mouth.  [name] lisinopril (PRINIVIL,ZESTRIL) 20 MG tablet TAKE 1 TABLET BY MOUTH EVERY DAY  . Magnesium 250 MG TABS Take 250 mg by mouth 2 (two) times daily.   . simvastatin (ZOCOR) 10 MG  tablet Take 1 tablet (10 mg total) by mouth at bedtime.        Pt reports that her blood work recently has indicate thyroid disease not well controlled and PCP has been adjusting meds.   Sexual History:   History  Sexual Activity  . Sexual Activity: No    Abuse/Trauma History: None.  Pt loss of her mother 15 years ago.   Psychiatric History:  Pt has been on antidepressants for 15 years.  Pt has been in counseling 3 times in past 18 years.   Family  Med/Psych History:  Family History  Problem Relation Age of Onset  . Cancer Mother     colon  . Dementia Mother   . Depression Mother   . Heart disease Father 71  . Heart disease Sister 40    stents, pacemaker  . Depression Sister   . Heart disease Brother 56    CABG, MI  . Obesity Daughter   . Suicidality Daughter   . Bipolar disorder Daughter   . Obesity Son   . Mental illness Daughter     bipolar  . Obesity Daughter   . Dementia Paternal Aunt     Risk of Suicide/Violence: virtually non-existent No hx of SI or any self harm. NO hx of HI or aggression.   Impression/DX:  Pt is a 68 y/o married woman who present for counseling of MDD as referred by Dr. Michae Kava.  Pt has been experiencing recurrent episodes of depression for the past 18 years and had been on the same medications for the past 15 years.  Pt was referred to Dr. Michae Kava for medication management and 3 weeks ago medication regimen was changed.  Pt reports some improvement not sleeping as much and not in bed all the time.  Pt was aware of her stressors and reports that counseling has been beneficial in the past and wanting counseling again to assist in coping and decreasing current symptoms.  No hx of SI, or self harm, no hx of SA.  Disposition/Plan:  Pt to f/u in 2 weeks for individual counseling. Pt to continue as scheduled w/ Dr. Michae Kava and continue to f/u w/ PCP for thyroid disease.   Diagnosis:      Major depressive disorder, recurrent episode,  moderate                Taran Haynesworth, LPC

## 2014-02-24 ENCOUNTER — Other Ambulatory Visit: Payer: Self-pay | Admitting: *Deleted

## 2014-02-24 DIAGNOSIS — E785 Hyperlipidemia, unspecified: Secondary | ICD-10-CM

## 2014-02-24 MED ORDER — SIMVASTATIN 10 MG PO TABS: 10.0000 mg | ORAL_TABLET | Freq: Every day | ORAL | Status: DC

## 2014-02-26 ENCOUNTER — Other Ambulatory Visit: Payer: Self-pay

## 2014-02-26 DIAGNOSIS — E039 Hypothyroidism, unspecified: Secondary | ICD-10-CM

## 2014-02-26 MED ORDER — LEVOTHYROXINE SODIUM 100 MCG PO TABS: ORAL_TABLET | ORAL | Status: DC

## 2014-03-12 ENCOUNTER — Ambulatory Visit (INDEPENDENT_AMBULATORY_CARE_PROVIDER_SITE_OTHER): Payer: Medicare Other | Admitting: Nurse Practitioner

## 2014-03-12 ENCOUNTER — Encounter: Payer: Self-pay | Admitting: Nurse Practitioner

## 2014-03-12 VITALS — BP 118/74 | HR 66 | Temp 98.7°F | Resp 16 | Ht 63.5 in | Wt 203.0 lb

## 2014-03-12 DIAGNOSIS — Z9884 Bariatric surgery status: Secondary | ICD-10-CM

## 2014-03-12 DIAGNOSIS — E538 Deficiency of other specified B group vitamins: Secondary | ICD-10-CM

## 2014-03-12 DIAGNOSIS — E559 Vitamin D deficiency, unspecified: Secondary | ICD-10-CM

## 2014-03-12 DIAGNOSIS — M8580 Other specified disorders of bone density and structure, unspecified site: Secondary | ICD-10-CM

## 2014-03-12 DIAGNOSIS — E785 Hyperlipidemia, unspecified: Secondary | ICD-10-CM

## 2014-03-12 DIAGNOSIS — R5382 Chronic fatigue, unspecified: Secondary | ICD-10-CM

## 2014-03-12 DIAGNOSIS — E039 Hypothyroidism, unspecified: Secondary | ICD-10-CM

## 2014-03-12 DIAGNOSIS — Z23 Encounter for immunization: Secondary | ICD-10-CM

## 2014-03-12 LAB — COMPREHENSIVE METABOLIC PANEL
ALT: 26 U/L (ref 0–35)
AST: 29 U/L (ref 0–37)
Albumin: 4.2 g/dL (ref 3.5–5.2)
Alkaline Phosphatase: 54 U/L (ref 39–117)
BUN: 10 mg/dL (ref 6–23)
CO2: 27 mEq/L (ref 19–32)
Calcium: 9.5 mg/dL (ref 8.4–10.5)
Chloride: 105 mEq/L (ref 96–112)
Creatinine, Ser: 0.7 mg/dL (ref 0.4–1.2)
GFR: 85.67 mL/min (ref >60.00)
Glucose, Bld: 100 mg/dL — ABNORMAL HIGH (ref 70–99)
Potassium: 4.4 mEq/L (ref 3.5–5.1)
Sodium: 141 mEq/L (ref 135–145)
Total Bilirubin: 0.8 mg/dL (ref 0.2–1.2)
Total Protein: 6.8 g/dL (ref 6.0–8.3)

## 2014-03-12 LAB — VITAMIN D 25 HYDROXY (VIT D DEFICIENCY, FRACTURES): VITD: 39.15 ng/mL (ref 30.00–100.00)

## 2014-03-12 LAB — CBC
HCT: 46.3 % — ABNORMAL HIGH (ref 36.0–46.0)
Hemoglobin: 15.1 g/dL — ABNORMAL HIGH (ref 12.0–15.0)
MCHC: 32.5 g/dL (ref 30.0–36.0)
MCV: 99.1 fl (ref 78.0–100.0)
Platelets: 353 10*3/uL (ref 150.0–400.0)
RBC: 4.68 Mil/uL (ref 3.87–5.11)
RDW: 14.1 % (ref 11.5–15.5)
WBC: 8.7 10*3/uL (ref 4.0–10.5)

## 2014-03-12 LAB — IRON AND TIBC
%SAT: 28 % (ref 20–55)
Iron: 104 ug/dL (ref 42–145)
TIBC: 372 ug/dL (ref 250–470)
UIBC: 268 ug/dL (ref 125–400)

## 2014-03-12 LAB — LIPID PANEL
Cholesterol: 174 mg/dL (ref 0–200)
HDL: 83.9 mg/dL (ref >39.00)
LDL Cholesterol: 75 mg/dL (ref 0–99)
NonHDL: 90.1
Total CHOL/HDL Ratio: 2
Triglycerides: 76 mg/dL (ref 0.0–149.0)
VLDL: 15.2 mg/dL (ref 0.0–40.0)

## 2014-03-12 LAB — TSH: TSH: 2.73 u[IU]/mL (ref 0.35–4.50)

## 2014-03-12 LAB — VITAMIN B12: Vitamin B-12: 694 pg/mL (ref 211–911)

## 2014-03-12 LAB — MAGNESIUM: Magnesium: 2 mg/dL (ref 1.5–2.5)

## 2014-03-12 NOTE — Progress Notes (Signed)
Pre visit review using our clinic review tool, if applicable. No additional management support is needed unless otherwise documented below in the visit note. 

## 2014-03-12 NOTE — Assessment & Plan Note (Signed)
Persistent fatigue, also battling depression. Wt stable. Denies palpitations, bowel changes. Taking med properly. TSH today.

## 2014-03-12 NOTE — Patient Instructions (Signed)
Decrease lisinopril to 1/2 tablet daily. Check blood pressure 3 times at home. Call me if over 150/90.  When checking blood pressure, feet should be flat on floor, cuff at level of heart, sit for 5 minutes before taking reading.  Med schedule: Continue to take levothyroxine 1 hour before coffee or breakfast-can take during night if have to go to bathroom. Breakfast: lisinopril & wellbutrin Lunch: lexapro & B complex Supper: Vitamin D & zocor  Bedtime: wellbutrin, magnesium Iron is best absorbed on empty stomach or about 2 hours after eating.  Exercise will increase energy. Take a 15 minute walk every day even though you would rather take a nap.   My office will call with lab results and follow up.  Plan on seeing me again in 6 months.  Happy Thanksgiving!

## 2014-03-12 NOTE — Progress Notes (Signed)
Subjective:     Patient ID: Kendra Ball is a 68 y.o. female presents for follow up of hyperlipidemia, thyroid disease, HTN, c/o persistent, overwhelming fatigue. She has Hx of gastric bypass placing her at risk for nutrient deficiencies & osteopenia. Re hyperlipidemia: RF-strong fam Hx CAD, smoker, sedentary, CXR shows coronary artery calcification. HDL is in 70's!! Tolerating med well. Re thyroid: increased med 6 weeks ago, persistent fatigue, taking med properly. Re HTN: well controlled. No microalbuminuria. Not exercising. Re fatigue: She is taking iron, Bcomplex, Mg, Vit D. Has had 2 cardiac stress tests in past-nml; denies ischemic symptoms. She is battling depression-taking care of daughter's children; seeing psychiatry-recent med changes.  The following portions of the patient's history were reviewed and updated as appropriate: allergies, current medications, past family history, past medical history, past social history, past surgical history and problem list.  Review of Systems Constitutional: positive for fatigue, negative for fevers and weight loss Cardiovascular: positive for fatigue, negative for chest pressure/discomfort, irregular heart beat, lower extremity edema and palpitations Musculoskeletal:positive for occasional restless leg symptoms relieved by magnesium supp Behavioral/Psych: positive for depression, tobacco use and distinguishes fatigue associated w/depression-not wanting to get OOB as diff than tired-feeling like needs nap, negative for sleep disturbance Endocrine: positive for fatigue, negative for temperature intolerance    Objective:    BP 118/74 mmHg  Pulse 66  Temp(Src) 98.7 F (37.1 C) (Oral)  Resp 16  Ht 5' 3.5" (1.613 m)  Wt 203 lb (92.08 kg)  BMI 35.39 kg/m2  SpO2 97% BP 118/74 mmHg  Pulse 66  Temp(Src) 98.7 F (37.1 C) (Oral)  Resp 16  Ht 5' 3.5" (1.613 m)  Wt 203 lb (92.08 kg)  BMI 35.39 kg/m2  SpO2 97% General appearance: alert,  cooperative, appears stated age and no distress Head: Normocephalic, without obvious abnormality, atraumatic Eyes: negative findings: lids and lashes normal, conjunctivae and sclerae normal and wearing glasses Neck: no adenopathy, no carotid bruit, supple, symmetrical, trachea midline and thyroid not enlarged, symmetric, no tenderness/mass/nodules Lungs: clear to auscultation bilaterally Heart: regular rate and rhythm, S1, S2 normal, no murmur, click, rub or gallop Extremities: extremities normal, atraumatic, no cyanosis or edema Pulses: 2+ and symmetric Lymph nodes: Cervical, supraclavicular, and axillary nodes normal.  ECG: NSR   Assessment:   1. Chronic fatigue - EKG 12-Lead - CBC - Comprehensive metabolic panel - Iron and TIBC - Magnesium Encourage daily walk Gave med schedule to minimize fatigue If labs nml, may consider cardio consult, given fam Hx, RF, & coronary art calc findings on CXR  2. Vitamin B12 deficiency without anemia - CBC - Vitamin B12  3. Vitamin D deficiency - Vit D  25 hydroxy (rtn osteoporosis monitoring)  4. Hypothyroidism, unspecified hypothyroidism type - CBC - Comprehensive metabolic panel - TSH  5. Hyperlipemia - Lipid panel  6. Osteopenia  See patient instructions for complete plan. F/u 6 mos, PRN

## 2014-03-12 NOTE — Assessment & Plan Note (Signed)
Tolerating D3 supplement w/out SE.  Check level today.

## 2014-03-12 NOTE — Assessment & Plan Note (Signed)
Tolerating w/out SE. Encourage daily walk Lipids today

## 2014-03-13 ENCOUNTER — Telehealth: Payer: Self-pay | Admitting: Nurse Practitioner

## 2014-03-13 NOTE — Addendum Note (Signed)
Addended by: Kathlen Mody, Janann August [facility] on: 03/13/2014 01:21 PM   Modules accepted: Level of Service

## 2014-03-13 NOTE — Telephone Encounter (Signed)
pls call pt: Advise All labs look great! She can cut iron supplement back to 5 days/week. No other changes. Keep scheduled f/u OV.

## 2014-03-13 NOTE — Addendum Note (Signed)
Addended by: Jannette Spanner on: 03/13/2014 01:33 PM   Modules accepted: Orders

## 2014-03-13 NOTE — Telephone Encounter (Signed)
Spoke with pt, advised lab results. Pt understood. 

## 2014-03-18 ENCOUNTER — Ambulatory Visit (INDEPENDENT_AMBULATORY_CARE_PROVIDER_SITE_OTHER): Payer: Medicare Other | Admitting: Psychology

## 2014-03-18 DIAGNOSIS — F331 Major depressive disorder, recurrent, moderate: Secondary | ICD-10-CM

## 2014-03-18 NOTE — Progress Notes (Signed)
   THERAPIST PROGRESS NOTE  Session Time: 11.10am-12pm  Participation Level: Active  Behavioral Response: Well GroomedAlertDepressed  Type of Therapy: Individual Therapy  Treatment Goals addressed: Diagnosis: MDD and goal 1.  Interventions: CBT and Supportive  Summary: Kendra Ball is a 68 y.o. female who presents with depressed mood and affect- at times tearful as discussing her stressors.  Pt reported that her daughter was discharged from inpt tx 3 weeks ago and although better than was still struggling greatly and she is main support to her and grandchildren.  Pt discussed how only coping she is doing for self is sleeping and gained awareness that need to take more time for self.  Pt also discussed awareness that her want to move back to [facility] is not realistic with her valuing of caring for family and she has some resentment that this has happened.  Pt increased awareness that she needs to build again her support system here and make this 2nd home for self as well.  Pt also discussed some potential benefit in communicating needs to husband to gain more support.  Pt did report feels best when gets out of home, socializes with others and takes care of self.   Suicidal/Homicidal: Nowithout intent/plan  Therapist Response: Assessed pt current functioning per pt report.  Processed w/ pt feeling re: caretaking that hadn't planned on and how impacting her plans in retirement.  Explored w/pt how she is using her coping skills and how lack of support for herself and importance of her tending to on emotional needs as well.  Encouraged daily self care and communicating w/ husband to express needs and build support network here.   Plan: Return again in 2 weeks.  Diagnosis: Axis I: MDD    Axis II: No diagnosis    Eilah Common, LPC 03/18/2014

## 2014-03-24 ENCOUNTER — Other Ambulatory Visit (HOSPITAL_COMMUNITY): Payer: Self-pay | Admitting: Psychiatry

## 2014-03-28 ENCOUNTER — Other Ambulatory Visit (HOSPITAL_COMMUNITY): Payer: Self-pay | Admitting: Psychiatry

## 2014-03-31 ENCOUNTER — Telehealth (HOSPITAL_COMMUNITY): Payer: Self-pay | Admitting: Psychiatry

## 2014-04-01 ENCOUNTER — Ambulatory Visit (INDEPENDENT_AMBULATORY_CARE_PROVIDER_SITE_OTHER): Payer: 59 | Admitting: Psychiatry

## 2014-04-01 ENCOUNTER — Encounter (HOSPITAL_COMMUNITY): Payer: Self-pay | Admitting: Psychiatry

## 2014-04-01 VITALS — BP 143/79 | HR 86 | Ht 63.0 in | Wt 200.2 lb

## 2014-04-01 DIAGNOSIS — F331 Major depressive disorder, recurrent, moderate: Secondary | ICD-10-CM

## 2014-04-01 DIAGNOSIS — F17200 Nicotine dependence, unspecified, uncomplicated: Secondary | ICD-10-CM

## 2014-04-01 DIAGNOSIS — F411 Generalized anxiety disorder: Secondary | ICD-10-CM

## 2014-04-01 MED ORDER — LORAZEPAM 1 MG PO TABS: 1.0000 mg | ORAL_TABLET | Freq: Two times a day (BID) | ORAL | Status: DC | PRN

## 2014-04-01 MED ORDER — BUPROPION HCL ER (SR) 150 MG PO TB12: 150.0000 mg | ORAL_TABLET | Freq: Two times a day (BID) | ORAL | Status: DC

## 2014-04-01 MED ORDER — ESCITALOPRAM OXALATE 20 MG PO TABS: 20.0000 mg | ORAL_TABLET | Freq: Every day | ORAL | Status: DC

## 2014-04-01 NOTE — Progress Notes (Signed)
[place] Progress Note  Kendra Ball [id number] 68 y.o.  04/01/2014 2:11 PM  Chief Complaint: "i think better..."  History of Present Illness: About 2 weeks after last appt pt's daughter went to [facility] for treatment. During that time pt took care of her grand kids. Pt feels she handled it very well and was not overwhelmed.  Depression is getting better. States she is more active and more involved with her grand kids. She feels down on days when she has no goals or things planned. On those days she is very tired and spends all day in bed. Denies isolation, anhedonia, crying spells and worthlessness. She is more interested in doing things now. Sleep is a little worse and it is hard for her to fall asleep. She takes Melatonin, 2 Tylenoll PM and 1/2 Ativan.  Pt gets about 5-7 hrs but than naps 3 hrs twice a day. Appetite is good. Concentration is improving.   Anxiety is present but manageable. She takes Ativan BID. Some mornings she feels overwhelmed and not able to do anything.   Pt takes Lexapro, Wellbutrin and Ativan as prescribed and denies SE.   Suicidal Ideation: No Plan Formed: No Patient has means to carry out plan: No  Homicidal Ideation: No Plan Formed: No Patient has means to carry out plan: No  Review of Systems: Psychiatric: Agitation: No Hallucination: No Depressed Mood: Yes Insomnia: Yes Hypersomnia: No Altered Concentration: No Feels Worthless: No Grandiose Ideas: No Belief In Special Powers: No New/Increased Substance Abuse: No Compulsions: No  Neurologic: Headache: No Seizure: No Paresthesias: No   Review of Systems  Constitutional: Negative for fever and chills.  HENT: Negative for congestion, nosebleeds and sore throat.   Eyes: Negative for blurred vision, double vision and redness.  Respiratory: Negative for cough, sputum production and shortness of breath.   Cardiovascular: Negative for chest pain, palpitations and leg  swelling.  Gastrointestinal: Positive for abdominal pain and constipation. Negative for heartburn, nausea, vomiting and diarrhea.  Musculoskeletal: Positive for back pain and joint pain. Negative for myalgias and neck pain.  Skin: Negative for itching and rash.  Neurological: Negative for dizziness, tingling, seizures, weakness and headaches.  Psychiatric/Behavioral: Positive for depression. Negative for suicidal ideas, hallucinations and substance abuse. The patient is nervous/anxious and has insomnia.      Past Medical Family, Social History: lives in [place] with her husband. Has 3 kids. Pt retired in 2010. She worked as a Network engineer at [employer].  reports that she has been smoking Cigarettes.  She has been smoking about 1.00 pack per day. She has never used smokeless tobacco. She reports that she drinks about 0.6 oz of alcohol per week. She reports that she does not use illicit drugs.  Family History  Problem Relation Age of Onset  . Cancer Mother     colon  . Dementia Mother   . Depression Mother   . Heart disease Father 3  . Heart disease Sister 71    stents, pacemaker  . Depression Sister   . Heart disease Brother 22    CABG, MI  . Obesity Daughter   . Suicidality Daughter   . Bipolar disorder Daughter   . Obesity Son   . Mental illness Daughter     bipolar  . Obesity Daughter   . Dementia Paternal Aunt    Past Medical History  Diagnosis Date  . DEPRESSION 08/26/2008  . HYPERTENSION 08/26/2008  . HYPOTHYROIDISM 08/26/2008  . OSTEOPENIA 08/26/2008  .  TOBACCO USER 12/25/2009  . Obesity   . Hx of colonic polyps     First noted on  colonoscopy 2012  . Hypercholesteremia     Outpatient Encounter Prescriptions as of 04/01/2014  Medication Sig  . aspirin 81 MG tablet Take 81 mg by mouth daily.  [name] buPROPion (WELLBUTRIN SR) 150 MG 12 hr tablet Take 1 tablet (150 mg total) by mouth 2 (two) times daily.  . cholecalciferol (VITAMIN D) 1000 UNITS tablet Take 1,000  Units by mouth daily.  . diclofenac sodium (VOLTAREN) 1 % GEL Apply 50 cent piece-sized amount to hip TID PRN.  . diphenhydramine-acetaminophen (TYLENOL PM EXTRA STRENGTH) 25-500 MG TABS Take 2 tablets by mouth at bedtime as needed.  [name] escitalopram (LEXAPRO) 20 MG tablet Take 1 tablet (20 mg total) by mouth daily.  . ferrous fumarate (HEMOCYTE - 106 MG FE) 325 (106 FE) MG TABS Take 1 tablet by mouth.  . levothyroxine (SYNTHROID, LEVOTHROID) 100 MCG tablet Take 1T po 1 hr before coffee & breakfast 5d/wk. Skip Wed. & Sun.  . lisinopril (PRINIVIL,ZESTRIL) 20 MG tablet TAKE 1 TABLET BY MOUTH EVERY DAY  . LORazepam (ATIVAN) 1 MG tablet Take 1 tablet (1 mg total) by mouth 2 (two) times daily as needed for anxiety.  . Magnesium 250 MG TABS Take 250 mg by mouth 2 (two) times daily.   . Melatonin 3 MG TABS Take 3 mg by mouth at bedtime as needed.  . simvastatin (ZOCOR) 10 MG tablet Take 1 tablet (10 mg total) by mouth at bedtime.  . [DISCONTINUED] buPROPion (WELLBUTRIN SR) 150 MG 12 hr tablet Take 1 tablet (150 mg total) by mouth 2 (two) times daily.  . [DISCONTINUED] escitalopram (LEXAPRO) 20 MG tablet TAKE 1 TABLET (20 MG TOTAL) BY MOUTH DAILY.  . [DISCONTINUED] LORazepam (ATIVAN) 1 MG tablet Take 1 tablet (1 mg total) by mouth 2 (two) times daily as needed for anxiety.    Past Psychiatric History/Hospitalization(s): Anxiety: Yes Bipolar Disorder: No Depression: Yes Mania: No Psychosis: No Schizophrenia: No Personality Disorder: No Hospitalization for psychiatric illness: No History of Electroconvulsive Shock Therapy: No Prior Suicide Attempts: No  Physical Exam: Constitutional:  BP 143/79 mmHg  Pulse 86  Ht 5\' 3"  (1.6 m)  Wt 200 lb 3.2 oz (90.81 kg)  BMI 35.47 kg/m2  General Appearance: alert, oriented, no acute distress  Musculoskeletal: Strength & Muscle Tone: within normal limits Gait & Station: normal Patient leans: N/A  Mental Status Examination/Evaluation: Objective:  Attitude: Calm and cooperative  Appearance: Fairly Groomed, appears to be stated age  Eye Contact::  Good  Speech:  Clear and Coherent and Normal Rate  Volume:  Normal  Mood:  euthymic  Affect:  Full Range  Thought Process:  Linear and Logical  Orientation:  Full (Time, Place, and Person)  Thought Content:  WDL  Suicidal Thoughts:  No  Homicidal Thoughts:  No  Judgement:  Fair  Insight:  Fair  Concentration: good  Memory: Immediate-good Recent-good Remote-good  Recall: fair  Language: fair  Gait and Station: normal  [employer] of Knowledge: average  Psychomotor Activity:  Normal  Akathisia:  No  Handed:  Right  AIMS (if indicated): n/a  Assets:  Armed forces logistics/support/administrative officer Desire for Improvement Financial Resources/Insurance Housing Intimacy Leisure Time Resilience Social Support Astronomer Decision Making (Choose Three): Established Problem, Stable/Improving (1), Review of Psycho-Social Stressors (1), Review or order clinical lab tests (1) and Review of Medication Regimen & Side  Effects (2)  Assessment: AXIS I MDD- recurrent, moderate. GAD. Nicotine dependence  AXIS II Deferred   AXIS III Past Medical History  Diagnosis Date  . DEPRESSION 08/26/2008  . HYPERTENSION 08/26/2008  . HYPOTHYROIDISM 08/26/2008  . OSTEOPENIA 08/26/2008  . TOBACCO USER 12/25/2009  . Obesity   . Hx of colonic polyps     First noted on colonoscopy 2012  . Hypercholesteremia      AXIS IV other psychosocial or environmental problems and problems with primary support group  AXIS V 51-60 moderate symptoms       Treatment Plan/Recommendations:  Plan of Care:  Medication management with supportive therapy. Risks/benefits and SE of the medication discussed. Pt verbalized understanding and verbal consent obtained for treatment.  Affirm with the patient that the medications are taken as ordered.  Patient expressed understanding of how their medications were to be used.  -improvement of symptoms   Laboratory: Reviewed labs with pt, no new labs today  Psychotherapy: Therapy: brief supportive therapy provided. Discussed psychosocial stressors in detail.   -discussed smoking cessation. Smoking cessation instruction/counseling given: counseled patient on the dangers of tobacco use, advised patient to stop smoking, and reviewed strategies to maximize success  -reviewed sleep hygiene in detail   Medications:  Lexapro 20mg  po qD for depression and anxiety Continue Wellbutrin SR 150mg  po BID for depression Continue Ativan 1mg  po BID prn anxiety   Routine PRN Medications: Yes  Consultations: encouraged to f/up with PCP as needed, encouraged to continue individual therapy  Safety Concerns: Pt denies SI and is at an acute low risk for suicide.Patient told to call clinic if any problems occur. Patient advised to go to ER if they should develop SI/HI, side effects, or if symptoms worsen. Has crisis numbers to call if needed. Pt verbalized understanding.   Other: F/up in 3 months or sooner if needed     [name], MD 04/01/2014

## 2014-04-07 ENCOUNTER — Other Ambulatory Visit: Payer: Self-pay | Admitting: *Deleted

## 2014-04-07 DIAGNOSIS — I10 Essential (primary) hypertension: Secondary | ICD-10-CM

## 2014-04-07 MED ORDER — LISINOPRIL 20 MG PO TABS: 20.0000 mg | ORAL_TABLET | Freq: Every day | ORAL | Status: DC

## 2014-04-08 ENCOUNTER — Other Ambulatory Visit: Payer: Self-pay | Admitting: *Deleted

## 2014-04-08 ENCOUNTER — Ambulatory Visit (INDEPENDENT_AMBULATORY_CARE_PROVIDER_SITE_OTHER): Payer: 59 | Admitting: Psychology

## 2014-04-08 DIAGNOSIS — F331 Major depressive disorder, recurrent, moderate: Secondary | ICD-10-CM

## 2014-04-08 DIAGNOSIS — E039 Hypothyroidism, unspecified: Secondary | ICD-10-CM

## 2014-04-08 DIAGNOSIS — F411 Generalized anxiety disorder: Secondary | ICD-10-CM

## 2014-04-08 MED ORDER — LEVOTHYROXINE SODIUM 100 MCG PO TABS: ORAL_TABLET | ORAL | Status: DC

## 2014-04-08 NOTE — Telephone Encounter (Signed)
[facility] [address] 

## 2014-04-08 NOTE — Progress Notes (Signed)
   THERAPIST PROGRESS NOTE  Session Time: 1.30pm-2.18pm  Participation Level: Active  Behavioral Response: Well GroomedAlertOverwhelmed  Type of Therapy: Individual Therapy  Treatment Goals addressed: Diagnosis: MDD, GAD and goal 1.  Interventions: CBT and Other: Mindfulness  Summary: [name] is a 68 y.o. female who presents with report of feeling overwhelmed and ruminating on how to best care for her grandkids and daughter.  Pt discussed that she had been doing fairly well and this weekend her granddaughter asked about coming to live w/ her as doesn't feel mom is present to her and instead sleeping mostly.  Pt discussed how feels burden as not sure how making change for grandchildren will effect daughter and vice versa.  Pt discussed awareness of need to give self break from ruminating thoughts. Pt receptive to building mindfulness practice and gained awareness of how to use w/ her senses.  Pt gained insight of recent times that occurred for self spontaneously but that not seeking out for self and discussed doing so at least daily.    Suicidal/Homicidal: Nowithout intent/plan  Therapist Response: Assessed pt current functioning per pt report.  Processed w/pt her recent stressors and reiterated that she can't take responsibility for daughter's wellness although she is a support.  Encouraged pt use of mindfulness to assist w/ breaking ruminating thought.  Introduced Network engineer in session w/ use of 5 senses.  Discussed w/ pt how to incorporate for self.   Plan: Return again in 3 weeks.  Diagnosis:  Generalized Anxiety Disorder and MDD       YATES,LEANNE, [facility] 04/08/2014

## 2014-04-08 NOTE — Telephone Encounter (Signed)
Done

## 2014-04-24 ENCOUNTER — Ambulatory Visit (INDEPENDENT_AMBULATORY_CARE_PROVIDER_SITE_OTHER): Payer: Medicare Other | Admitting: Nurse Practitioner

## 2014-04-24 ENCOUNTER — Encounter: Payer: Self-pay | Admitting: Nurse Practitioner

## 2014-04-24 ENCOUNTER — Ambulatory Visit (HOSPITAL_BASED_OUTPATIENT_CLINIC_OR_DEPARTMENT_OTHER)
Admission: RE | Admit: 2014-04-24 | Discharge: 2014-04-24 | Disposition: A | Discharge status: Home / Self Care | Payer: Medicare Other | Source: Ambulatory Visit | Attending: Nurse Practitioner | Admitting: Nurse Practitioner

## 2014-04-24 VITALS — BP 132/67 | HR 76 | Temp 98.0°F | Ht 63.5 in | Wt 199.0 lb

## 2014-04-24 DIAGNOSIS — K5900 Constipation, unspecified: Secondary | ICD-10-CM

## 2014-04-24 DIAGNOSIS — R109 Unspecified abdominal pain: Secondary | ICD-10-CM | POA: Insufficient documentation

## 2014-04-24 DIAGNOSIS — R1084 Generalized abdominal pain: Secondary | ICD-10-CM

## 2014-04-24 DIAGNOSIS — N3090 Cystitis, unspecified without hematuria: Secondary | ICD-10-CM

## 2014-04-24 LAB — CBC WITH DIFFERENTIAL/PLATELET
Basophils Absolute: 0 10*3/uL (ref 0.0–0.1)
Basophils Relative: 0 % (ref 0–1)
Eosinophils Absolute: 0.1 10*3/uL (ref 0.0–0.7)
Eosinophils Relative: 1 % (ref 0–5)
HCT: 37.1 % (ref 36.0–46.0)
Hemoglobin: 13.1 g/dL (ref 12.0–15.0)
Lymphocytes Relative: 13 % (ref 12–46)
Lymphs Abs: 1.7 10*3/uL (ref 0.7–4.0)
MCH: 32.2 pg (ref 26.0–34.0)
MCHC: 35.3 g/dL (ref 30.0–36.0)
MCV: 91.2 fL (ref 78.0–100.0)
MPV: 9.3 fL (ref 8.6–12.4)
Monocytes Absolute: 0.9 10*3/uL (ref 0.1–1.0)
Monocytes Relative: 7 % (ref 3–12)
Neutro Abs: 10.1 10*3/uL — ABNORMAL HIGH (ref 1.7–7.7)
Neutrophils Relative %: 79 % — ABNORMAL HIGH (ref 43–77)
Platelets: 474 10*3/uL — ABNORMAL HIGH (ref 150–400)
RBC: 4.07 MIL/uL (ref 3.87–5.11)
RDW: 12.1 % (ref 11.5–15.5)
WBC: 12.8 10*3/uL — ABNORMAL HIGH (ref 4.0–10.5)

## 2014-04-24 LAB — COMPREHENSIVE METABOLIC PANEL
ALT: 11 U/L (ref 0–35)
AST: 14 U/L (ref 0–37)
Albumin: 3.5 g/dL (ref 3.5–5.2)
Alkaline Phosphatase: 58 U/L (ref 39–117)
BUN: 7 mg/dL (ref 6–23)
CO2: 25 mEq/L (ref 19–32)
Calcium: 8.8 mg/dL (ref 8.4–10.5)
Chloride: 102 mEq/L (ref 96–112)
Creat: 0.58 mg/dL (ref 0.50–1.10)
Glucose, Bld: 91 mg/dL (ref 70–99)
Potassium: 3.2 mEq/L — ABNORMAL LOW (ref 3.5–5.3)
Sodium: 139 mEq/L (ref 135–145)
Total Bilirubin: 0.5 mg/dL (ref 0.2–1.2)
Total Protein: 6.2 g/dL (ref 6.0–8.3)

## 2014-04-24 LAB — POCT URINALYSIS DIPSTICK
Glucose, UA: NEGATIVE
Ketones, UA: 40
Leukocytes, UA: NEGATIVE
Nitrite, UA: NEGATIVE
Protein, UA: NEGATIVE
Spec Grav, UA: 1.01
Urobilinogen, UA: 1
pH, UA: 5.5

## 2014-04-24 MED ORDER — SULFAMETHOXAZOLE-TRIMETHOPRIM 800-160 MG PO TABS: 1.0000 | ORAL_TABLET | Freq: Two times a day (BID) | ORAL | Status: DC

## 2014-04-24 NOTE — Progress Notes (Signed)
Subjective:     Kendra Ball is a 67 y.o. female who presents for evaluation of abdominal pain. Onset was 4 weeks ago. Symptoms have been waxing & waning. The pain is described as burning and cramping, and is mild to severe/10 in intensity. Pain is located in the LLQ, RLQ and suprapubic region without radiation.  Aggravating factors: none.  Alleviating factors: decreasing food intake & eating "bland" foods; took miralax-1 dose daily for 3 days & had large BM w/symptom relief. She felt well for 3 weeks, then Pain & constipation returned 5 days ago. Associated symptoms: low back pain that radiates around to low abdomen, constipation and small frequent loose stools. The patient denies anorexia, belching, diarrhea, dysuria, fever, flatus, frequency, nausea, vomiting and indigestion. RF: pt started lexapro about 8 weeks ago-constipation may be SE; Mother had colon ca.  The patient's history has been marked as reviewed and updated as appropriate.  Review of Systems Constitutional: positive for 6 lb weight loss in 10 weeks Ears, nose, mouth, throat, and face: negative for hoarseness and sore throat Respiratory: negative for dyspnea on exertion Cardiovascular: negative for chest pressure/discomfort and palpitations Gastrointestinal: positive for change in bowel habits Genitourinary:negative for flank pain Endocrine: negative for temperature intolerance     Objective:    BP 132/67 mmHg  Pulse 76  Temp(Src) 98 F (36.7 C) (Temporal)  Ht 5' 3.5" (1.613 m)  Wt 199 lb (90.266 kg)  BMI 34.69 kg/m2  SpO2 97% General appearance: alert, cooperative, appears stated age and no distress Head: Normocephalic, without obvious abnormality, atraumatic Eyes: negative findings: lids and lashes normal and conjunctivae and sclerae normal Back: no CVA tenderness, no spinal or SIJ tenderness, no muscle tendernessin lumbar region. Abdomen: normal findings: no masses palpable and no organomegaly and abnormal  findings:  pedulous abdomen & diffusely tender    Assessment:plan     1. Constipation, unspecified constipation type, New - POCT urinalysis dipstick - Urine culture - DG Abd Acute W/Chest; Future - Urine Microscopic Only - TSH - Comprehensive metabolic panel - CBC with Differential  2. Generalized abdominal pain, New DD: partial obstruction, diverticulitis, SE lexapro, kidney stone - DG Abd Acute W/Chest; Future - Urine Microscopic Only - TSH - Comprehensive metabolic panel - CBC with Differential  3. Cystitis, new Likely sequela of constipation - sulfamethoxazole-trimethoprim (BACTRIM DS,SEPTRA DS) 800-160 MG per tablet; Take 1 tablet by mouth every 12 (twelve) hours.  Dispense: 6 tablet; Refill: 0 - Urine Microscopic Only - TSH - Comprehensive metabolic panel - CBC with Differential - POCT urinalysis dipstick-pos for blood, leuks, protein - Urine culture

## 2014-04-24 NOTE — Progress Notes (Signed)
Pre visit review using our clinic review tool, if applicable. No additional management support is needed unless otherwise documented below in the visit note. 

## 2014-04-24 NOTE — Patient Instructions (Signed)
Please get abdominal xrays to help determine if there is obstruction.  My office will call if there is obstruction & need to follow up immediately.  Urine indicates you may have infection, so start antibiotic. Sip fluids every hour to flush bladder & kidneys.  Eat fruit and vegetables daily to increase fiber & water to help with constipation.  Take miralax every hour until you have bowel movement, up to 4 doses. If no bowel movement, continue to drink fluids & repeat in 24 hours.    Your symptoms may be induced by lexapro and may be managed with diet higher in fiber.   Please return in 1 week to review labs.

## 2014-04-25 ENCOUNTER — Telehealth: Payer: Self-pay | Admitting: Nurse Practitioner

## 2014-04-25 DIAGNOSIS — K5732 Diverticulitis of large intestine without perforation or abscess without bleeding: Secondary | ICD-10-CM

## 2014-04-25 LAB — URINALYSIS, MICROSCOPIC ONLY
Bacteria, UA: NONE SEEN
Casts: NONE SEEN
Crystals: NONE SEEN

## 2014-04-25 LAB — TSH: TSH: 1.677 u[IU]/mL (ref 0.350–4.500)

## 2014-04-25 MED ORDER — SULFAMETHOXAZOLE-TRIMETHOPRIM 800-160 MG PO TABS: 1.0000 | ORAL_TABLET | Freq: Two times a day (BID) | ORAL | Status: DC

## 2014-04-25 MED ORDER — METRONIDAZOLE 500 MG PO TABS: 500.0000 mg | ORAL_TABLET | Freq: Four times a day (QID) | ORAL | Status: DC

## 2014-04-25 NOTE — Telephone Encounter (Signed)
Wbc elevated Microscopic hematuria abd xrays nml Will treat for diverticulitis: abx & diet modification Discussed w/pt.  All questions answered

## 2014-04-26 LAB — URINE CULTURE
Colony Count: NO GROWTH
Organism ID, Bacteria: NO GROWTH

## 2014-04-29 ENCOUNTER — Ambulatory Visit: Payer: Medicare Other | Admitting: Nurse Practitioner

## 2014-05-06 ENCOUNTER — Encounter: Payer: Self-pay | Admitting: Nurse Practitioner

## 2014-05-06 ENCOUNTER — Ambulatory Visit: Payer: TRICARE For Life (TFL) | Admitting: Nurse Practitioner

## 2014-05-06 ENCOUNTER — Ambulatory Visit (HOSPITAL_COMMUNITY): Payer: Self-pay | Admitting: Psychology

## 2014-05-06 ENCOUNTER — Ambulatory Visit (INDEPENDENT_AMBULATORY_CARE_PROVIDER_SITE_OTHER): Payer: Medicare Other | Admitting: Nurse Practitioner

## 2014-05-06 VITALS — BP 116/76 | HR 69 | Temp 98.2°F | Ht 63.5 in | Wt 202.0 lb

## 2014-05-06 DIAGNOSIS — D72829 Elevated white blood cell count, unspecified: Secondary | ICD-10-CM

## 2014-05-06 DIAGNOSIS — R312 Other microscopic hematuria: Secondary | ICD-10-CM

## 2014-05-06 DIAGNOSIS — E876 Hypokalemia: Secondary | ICD-10-CM

## 2014-05-06 DIAGNOSIS — R6881 Early satiety: Secondary | ICD-10-CM

## 2014-05-06 DIAGNOSIS — R3129 Other microscopic hematuria: Secondary | ICD-10-CM

## 2014-05-06 DIAGNOSIS — R1084 Generalized abdominal pain: Secondary | ICD-10-CM

## 2014-05-06 NOTE — Progress Notes (Signed)
Pre visit review using our clinic review tool, if applicable. No additional management support is needed unless otherwise documented below in the visit note. 

## 2014-05-06 NOTE — Patient Instructions (Signed)
Please get CT.  My office will call with lab results.

## 2014-05-07 ENCOUNTER — Telehealth: Payer: Self-pay | Admitting: Nurse Practitioner

## 2014-05-07 DIAGNOSIS — R6881 Early satiety: Secondary | ICD-10-CM | POA: Insufficient documentation

## 2014-05-07 DIAGNOSIS — R3129 Other microscopic hematuria: Secondary | ICD-10-CM | POA: Insufficient documentation

## 2014-05-07 DIAGNOSIS — E876 Hypokalemia: Secondary | ICD-10-CM | POA: Insufficient documentation

## 2014-05-07 DIAGNOSIS — D72829 Elevated white blood cell count, unspecified: Secondary | ICD-10-CM | POA: Insufficient documentation

## 2014-05-07 DIAGNOSIS — R1084 Generalized abdominal pain: Secondary | ICD-10-CM | POA: Insufficient documentation

## 2014-05-07 LAB — CBC
HCT: 42.6 % (ref 36.0–46.0)
Hemoglobin: 13.6 g/dL (ref 12.0–15.0)
MCHC: 32 g/dL (ref 30.0–36.0)
MCV: 96.7 fl (ref 78.0–100.0)
Platelets: 492 10*3/uL — ABNORMAL HIGH (ref 150.0–400.0)
RBC: 4.4 Mil/uL (ref 3.87–5.11)
RDW: 13.8 % (ref 11.5–15.5)
WBC: 9.1 10*3/uL (ref 4.0–10.5)

## 2014-05-07 LAB — BASIC METABOLIC PANEL WITH GFR
BUN: 6 mg/dL (ref 6–23)
CO2: 29 meq/L (ref 19–32)
Calcium: 9.3 mg/dL (ref 8.4–10.5)
Chloride: 105 meq/L (ref 96–112)
Creatinine, Ser: 0.59 mg/dL (ref 0.40–1.20)
GFR: 107.75 mL/min
Glucose, Bld: 87 mg/dL (ref 70–99)
Potassium: 4 meq/L (ref 3.5–5.1)
Sodium: 141 meq/L (ref 135–145)

## 2014-05-07 LAB — URINALYSIS, MICROSCOPIC ONLY

## 2014-05-07 NOTE — Telephone Encounter (Signed)
pls call pt: Advise Potassium back to nml. No blood in urine. But she has calcium oxylate-can predispose to kidney stones. Cut back tea & replace with water-tea has oxylate. CT scan should show kidney stones if she has them.

## 2014-05-07 NOTE — Progress Notes (Signed)
Subjective:     Kendra Ball is a 69 y.o. female who presents for follow up of abdominal pain. SHe was seen in office 2 weeks ago for same complaint-described as suprapubic burning & diffuse sharp abdominal pain that seems to start in low back & wrap around to bilat abdomen. Symptoms accompanied by constipation& early satiety. W/u included: acute abd & chest xr- no acute cardiopulmonary disease, Tiny calcified pulmonary nodules in left upper lobe consistent w/granulomas. Soft tissue structures in the abdomen or unremarkable. Gas pattern normal. No free air. Surgical sutures gastroesophageal junction. Pelvic calcifications consistent phleboliths. Degenerative changes and osteopenia lumbar spine. Labs: WBC 12.8, K+ 3.2, urine micro 7-10 RBC, urine cx-no growth.  Tx: miralax for constipation, gut rest w/liquid diet. Pain improved for several days, but returned 3 days ago w/constipation & early satiety. She started miralax again yesterday & has had several loose stools today.  She is a smoker. Has Hx of gastric bypass.   The patient's history has been marked as reviewed and updated as appropriate.  Review of Systems Constitutional: negative for fatigue and fevers Respiratory: negative for cough and pleurisy/chest pain Cardiovascular: negative for chest pressure/discomfort, irregular heart beat and palpitations Genitourinary:negative for dysuria, frequency and hesitancy Musculoskeletal:positive for bilat lumbar pain, no radiation, no probs w/gait     Objective:    BP 116/76 mmHg  Pulse 69  Temp(Src) 98.2 F (36.8 C) (Temporal)  Ht 5' 3.5" (1.613 m)  Wt 202 lb (91.627 kg)  BMI 35.22 kg/m2  SpO2 94% General appearance: alert, cooperative, appears stated age and no distress Head: Normocephalic, without obvious abnormality, atraumatic Eyes: negative findings: lids and lashes normal and conjunctivae and sclerae normal Lungs: clear to auscultation bilaterally Heart: regular rate and rhythm,  S1, S2 normal, no murmur, click, rub or gallop Abdomen: normal findings: no masses palpable and no organomegaly and abnormal findings:  obese and pendulous, RUQ tender    Assessment:Plan     1. Elevated white blood cell count, new - CBC  2. Hypokalemia, new Asymptomatic, No diuretics - Basic metabolic panel  3. Generalized abdominal pain  - CT Abdomen Pelvis W Contrast; Future  4. Early satiety, new - CT Abdomen Pelvis W Contrast; Future  5. Hematuria, microscopic - POCT urinalysis dipstick- sm blood - Urine Microscopic Will refer to urology if 2nd micro is pos for RBC  F/u PRN labs & imaging results

## 2014-05-08 ENCOUNTER — Telehealth: Payer: Self-pay | Admitting: Nurse Practitioner

## 2014-05-08 ENCOUNTER — Inpatient Hospital Stay (HOSPITAL_COMMUNITY)
Admission: EM | Admit: 2014-05-08 | Discharge: 2014-05-13 | DRG: 330 | Disposition: A | Discharge status: Home / Self Care | Payer: Medicare Other | Attending: Surgery | Admitting: Surgery

## 2014-05-08 ENCOUNTER — Ambulatory Visit (HOSPITAL_BASED_OUTPATIENT_CLINIC_OR_DEPARTMENT_OTHER)
Admission: RE | Admit: 2014-05-08 | Discharge: 2014-05-08 | Disposition: A | Discharge status: Home / Self Care | Payer: Medicare Other | Source: Ambulatory Visit | Attending: Nurse Practitioner | Admitting: Nurse Practitioner

## 2014-05-08 ENCOUNTER — Encounter (HOSPITAL_BASED_OUTPATIENT_CLINIC_OR_DEPARTMENT_OTHER): Payer: Self-pay

## 2014-05-08 ENCOUNTER — Encounter (HOSPITAL_COMMUNITY): Payer: Self-pay | Admitting: Emergency Medicine

## 2014-05-08 DIAGNOSIS — D175 Benign lipomatous neoplasm of intra-abdominal organs: Secondary | ICD-10-CM | POA: Diagnosis present

## 2014-05-08 DIAGNOSIS — R6881 Early satiety: Secondary | ICD-10-CM

## 2014-05-08 DIAGNOSIS — E785 Hyperlipidemia, unspecified: Secondary | ICD-10-CM | POA: Diagnosis present

## 2014-05-08 DIAGNOSIS — R1084 Generalized abdominal pain: Secondary | ICD-10-CM

## 2014-05-08 DIAGNOSIS — Z8249 Family history of ischemic heart disease and other diseases of the circulatory system: Secondary | ICD-10-CM | POA: Diagnosis not present

## 2014-05-08 DIAGNOSIS — Z9884 Bariatric surgery status: Secondary | ICD-10-CM | POA: Diagnosis not present

## 2014-05-08 DIAGNOSIS — K660 Peritoneal adhesions (postprocedural) (postinfection): Secondary | ICD-10-CM | POA: Diagnosis present

## 2014-05-08 DIAGNOSIS — K561 Intussusception: Secondary | ICD-10-CM | POA: Diagnosis present

## 2014-05-08 DIAGNOSIS — E780 Pure hypercholesterolemia: Secondary | ICD-10-CM | POA: Diagnosis present

## 2014-05-08 DIAGNOSIS — F17210 Nicotine dependence, cigarettes, uncomplicated: Secondary | ICD-10-CM | POA: Diagnosis present

## 2014-05-08 DIAGNOSIS — D374 Neoplasm of uncertain behavior of colon: Secondary | ICD-10-CM

## 2014-05-08 DIAGNOSIS — M8580 Other specified disorders of bone density and structure, unspecified site: Secondary | ICD-10-CM | POA: Diagnosis present

## 2014-05-08 DIAGNOSIS — I10 Essential (primary) hypertension: Secondary | ICD-10-CM | POA: Diagnosis present

## 2014-05-08 DIAGNOSIS — Z86010 Personal history of colonic polyps: Secondary | ICD-10-CM

## 2014-05-08 DIAGNOSIS — F329 Major depressive disorder, single episode, unspecified: Secondary | ICD-10-CM | POA: Diagnosis present

## 2014-05-08 DIAGNOSIS — R109 Unspecified abdominal pain: Secondary | ICD-10-CM | POA: Diagnosis present

## 2014-05-08 DIAGNOSIS — K5669 Other intestinal obstruction: Secondary | ICD-10-CM | POA: Diagnosis present

## 2014-05-08 DIAGNOSIS — Z800 Family history of malignant neoplasm of digestive organs: Secondary | ICD-10-CM

## 2014-05-08 DIAGNOSIS — Z885 Allergy status to narcotic agent status: Secondary | ICD-10-CM

## 2014-05-08 DIAGNOSIS — E039 Hypothyroidism, unspecified: Secondary | ICD-10-CM | POA: Diagnosis present

## 2014-05-08 DIAGNOSIS — Z6834 Body mass index (BMI) 34.0-34.9, adult: Secondary | ICD-10-CM | POA: Diagnosis not present

## 2014-05-08 LAB — COMPREHENSIVE METABOLIC PANEL
ALT: 12 U/L (ref 0–35)
AST: 17 U/L (ref 0–37)
Albumin: 3.4 g/dL — ABNORMAL LOW (ref 3.5–5.2)
Alkaline Phosphatase: 54 U/L (ref 39–117)
Anion gap: 9 (ref 5–15)
BUN: 5 mg/dL — ABNORMAL LOW (ref 6–23)
CO2: 29 mmol/L (ref 19–32)
Calcium: 8.8 mg/dL (ref 8.4–10.5)
Chloride: 103 mEq/L (ref 96–112)
Creatinine, Ser: 0.65 mg/dL (ref 0.50–1.10)
GFR calc Af Amer: >90 mL/min (ref >90)
GFR calc non Af Amer: 90 mL/min — ABNORMAL LOW (ref >90)
Glucose, Bld: 83 mg/dL (ref 70–99)
Potassium: 3.7 mmol/L (ref 3.5–5.1)
Sodium: 141 mmol/L (ref 135–145)
Total Bilirubin: 0.5 mg/dL (ref 0.3–1.2)
Total Protein: 6.6 g/dL (ref 6.0–8.3)

## 2014-05-08 LAB — URINALYSIS, ROUTINE W REFLEX MICROSCOPIC
Bilirubin Urine: NEGATIVE
Glucose, UA: NEGATIVE mg/dL
Ketones, ur: NEGATIVE mg/dL
Leukocytes, UA: NEGATIVE
Nitrite: NEGATIVE
Protein, ur: NEGATIVE mg/dL
Specific Gravity, Urine: 1.012 (ref 1.005–1.030)
Urobilinogen, UA: 0.2 mg/dL (ref 0.0–1.0)
pH: 7 (ref 5.0–8.0)

## 2014-05-08 LAB — CBC WITH DIFFERENTIAL/PLATELET
Basophils Absolute: 0 10*3/uL (ref 0.0–0.1)
Basophils Relative: 0 % (ref 0–1)
Eosinophils Absolute: 0.1 10*3/uL (ref 0.0–0.7)
Eosinophils Relative: 1 % (ref 0–5)
HCT: 39.9 % (ref 36.0–46.0)
Hemoglobin: 12.8 g/dL (ref 12.0–15.0)
Lymphocytes Relative: 27 % (ref 12–46)
Lymphs Abs: 2.6 10*3/uL (ref 0.7–4.0)
MCH: 31.4 pg (ref 26.0–34.0)
MCHC: 32.1 g/dL (ref 30.0–36.0)
MCV: 98 fL (ref 78.0–100.0)
Monocytes Absolute: 0.6 10*3/uL (ref 0.1–1.0)
Monocytes Relative: 6 % (ref 3–12)
Neutro Abs: 6.3 10*3/uL (ref 1.7–7.7)
Neutrophils Relative %: 66 % (ref 43–77)
Platelets: 466 10*3/uL — ABNORMAL HIGH (ref 150–400)
RBC: 4.07 MIL/uL (ref 3.87–5.11)
RDW: 13.6 % (ref 11.5–15.5)
WBC: 9.7 10*3/uL (ref 4.0–10.5)

## 2014-05-08 LAB — URINE MICROSCOPIC-ADD ON

## 2014-05-08 MED ORDER — KCL IN DEXTROSE-NACL 20-5-0.45 MEQ/L-%-% IV SOLN
INTRAVENOUS | Status: DC
  Administered 2014-05-08 – 2014-05-09 (×3): via INTRAVENOUS
  Filled 2014-05-08 (×5): qty 1000

## 2014-05-08 MED ORDER — ENOXAPARIN SODIUM 40 MG/0.4ML ~~LOC~~ SOLN
40.0000 mg | SUBCUTANEOUS | Status: DC
  Administered 2014-05-08 – 2014-05-11 (×4): 40 mg via SUBCUTANEOUS
  Filled 2014-05-08 (×7): qty 0.4

## 2014-05-08 MED ORDER — ACETAMINOPHEN 325 MG PO TABS
650.0000 mg | ORAL_TABLET | Freq: Four times a day (QID) | ORAL | Status: DC | PRN
  Administered 2014-05-09: 650 mg via ORAL
  Filled 2014-05-08: qty 2

## 2014-05-08 MED ORDER — ACETAMINOPHEN 650 MG RE SUPP: 650.0000 mg | Freq: Four times a day (QID) | RECTAL | Status: DC | PRN

## 2014-05-08 MED ORDER — OXYCODONE HCL 5 MG PO TABS: 5.0000 mg | ORAL_TABLET | ORAL | Status: DC | PRN

## 2014-05-08 MED ORDER — ONDANSETRON HCL 4 MG/2ML IJ SOLN: 4.0000 mg | Freq: Four times a day (QID) | INTRAMUSCULAR | Status: DC | PRN

## 2014-05-08 MED ORDER — IOHEXOL 300 MG/ML  SOLN
100.0000 mL | Freq: Once | INTRAMUSCULAR | Status: AC | PRN
  Administered 2014-05-08: 100 mL via INTRAVENOUS

## 2014-05-08 NOTE — ED Notes (Addendum)
Pt, being sent by Nicky Pugh NP w/ [facility], c/o increasing abdominal pain and loose stools x 2 weeks.  Pt had a CT this morning and resulted a colo-colic intussusception.  NP contacted Rosenbower MD w/ [facility] and he suggested an inpatient workup.  Hx of gastric bypass.

## 2014-05-08 NOTE — ED Notes (Signed)
Pt had CT scan done today at [facility] for abdominal pain, found bowel intussusception, sent to ED for further evaluation.

## 2014-05-08 NOTE — Telephone Encounter (Signed)
Patient notified of results.

## 2014-05-08 NOTE — ED Notes (Signed)
Attempted to call report twice. Nurse not available. Will call back.

## 2014-05-08 NOTE — H&P (Signed)
Kendra Ball is an 69 y.o. female.    General Surgery [facility]  Chief Complaint: abdominal pain  HPI: patient presents to ER on referral from nurse practitioner with newly diagnosed colo-colonic intussusception.  Patient with symptoms for approx 1 month.  Intermittent abdominal pain, poorly localized.  Intermittent constipation relieved by laxatives.  No nausea or vomiting, no fever or chills.  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  Previous history of gastric bypass procedure and C-section.  GI: Dr. Meriel Pica  Past Medical History  Diagnosis Date  . DEPRESSION 08/26/2008  . HYPERTENSION 08/26/2008  . HYPOTHYROIDISM 08/26/2008  . OSTEOPENIA 08/26/2008  . TOBACCO USER 12/25/2009  . Obesity   . Hx of colonic polyps     First noted on  colonoscopy 2012  . Hypercholesteremia     Past Surgical History  Procedure Laterality Date  . Gastric bypass    . Cesarean section    . Dilation and curettage of uterus      x2  . Forearm fracture surgery      right  . Tonsillectomy      Family History  Problem Relation Age of Onset  . Cancer Mother     colon  . Dementia Mother   . Depression Mother   . Heart disease Father 65  . Heart disease Sister 19    stents, pacemaker  . Depression Sister   . Heart disease Brother 35    CABG, MI  . Obesity Daughter   . Suicidality Daughter   . Bipolar disorder Daughter   . Obesity Son   . Mental illness Daughter     bipolar  . Obesity Daughter   . Dementia Paternal Aunt    Social History:  reports that she has been smoking Cigarettes.  She has a 50 pack-year smoking history. She has never used smokeless tobacco. She reports that she drinks about 0.6 oz of alcohol per week. She reports that she does not use illicit drugs.  Allergies:  Allergies  Allergen Reactions  . Morphine Sulfate     Swelling around  injection area     (Not in a hospital admission)  No results found for this or any previous visit (from the past 48 hour(s)). Ct Abdomen Pelvis W Contrast  05/08/2014   CLINICAL DATA:  Generalized abdominal pain, burning in pelvic area since Thanksgiving, constipation, early satiety, past history of gastric bypass surgery, Cesarean section, hypertension, colon polyps, smoking, family history colon cancer  EXAM: CT ABDOMEN AND PELVIS WITH CONTRAST  TECHNIQUE: Multidetector CT imaging of the abdomen and pelvis was performed using the standard protocol following bolus administration of intravenous contrast. Sagittal and coronal MPR images reconstructed from axial data set.  CONTRAST:  171mL OMNIPAQUE IOHEXOL 300 MG/ML SOLN IV. Dilute oral contrast.  COMPARISON:  07/12/2010  FINDINGS: Lung bases clear.  Liver, spleen, pancreas, kidneys, and adrenal glands normal.  Scattered atherosclerotic calcifications without aneurysm.  9 cm length segment of colo-colic intussusception at the mid to distal transverse colon with a discrete fat attenuation mass as lead point measuring 3.9 x 3.2 x 3.0 cm compatible with transverse colon lipoma, noted on previous exam.  Associated wall thickening of the transverse colon with minimal pericolic inflammation.  Proximal transverse colon and ascending colon appear distended without evidence of small bowel dilatation/obstruction.  Contrast is present within the descending colon and proximal sigmoid colon.  Additional lipoma versus stool artifact at the mid descending colon 19 x 17 x 25 mm.  Prior gastric bypass surgery.  Small bowel loops unremarkable.  No additional mass, adenopathy, free fluid, or free air.  Normal appendix.  Unremarkable bladder, ureters, uterus, and ovaries with prominent venous structures in LEFT adnexa, extending to the LEFT ovarian vein, nonspecific but can be seen with pelvic congestion.  Osseous demineralization.  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  Associated bowel wall thickening with dilatation of colon proximal to the intussusception without evidence of small bowel obstruction.  Question additional lipoma at the descending colon 19 x 17 x 25 mm.  Prior gastric bypass surgery.  Findings called to Nicky Pugh NP on 05/08/2014 at 1458 hr.   Electronically Signed   By: Lavonia Dana M.D.   On: 05/08/2014 15:00    Review of Systems  Constitutional: Negative for fever, chills, weight loss and diaphoresis.  HENT: Negative.   Eyes: Negative.   Respiratory: Negative.   Cardiovascular: Negative.   Gastrointestinal: Positive for abdominal pain, diarrhea and constipation. Negative for nausea, vomiting, blood in stool and melena.  Genitourinary: Negative.   Musculoskeletal: Positive for back pain.  Skin: Negative.   Neurological: Negative.   Endo/Heme/Allergies: Negative.   Psychiatric/Behavioral: Positive for depression.    Blood pressure 117/72, pulse 64, temperature 98.2 F (36.8 C), temperature source Oral, resp. rate 18, SpO2 97 %. Physical Exam  Constitutional: She is oriented to person, place, and time. She appears well-developed and well-nourished. No distress.  HENT:  Head: Normocephalic and atraumatic.  Right Ear: External ear normal.  Left Ear: External ear normal.  Eyes: Conjunctivae are normal. Pupils are equal, round, and reactive to light. No scleral icterus.  Neck: Normal range of motion. Neck supple. No tracheal deviation present. No thyromegaly present.  Cardiovascular: Normal rate, regular rhythm and normal heart sounds.   No murmur heard. Respiratory: Effort normal and breath sounds normal. She has no wheezes.  GI: Soft. Bowel sounds are normal. She exhibits no distension and no mass. There is tenderness (lower mid abdomen, poorly localized). There is no rebound and no guarding.   Musculoskeletal: Normal range of motion. She exhibits no edema or tenderness.  Neurological: She is alert and oriented to person, place, and time.  Skin: Skin is warm and dry.  Psychiatric: She has a normal mood and affect. Her behavior is normal.     Assessment/Plan Colo-colonic intussusception  Admit to surgical service  Clear liquid diet, may need gentle bowel prep to avoid colostomy  Consider GI consultation to see if lipomas in transverse and descending colon amenable to colonoscopic resection  Will discuss with bariatric surgeons (Dr. Excell Seltzer) as well  Patient and family agree with plans  Earnstine Regal, MD, [facility] Office: [place] 05/08/2014, 8:45 PM

## 2014-05-08 NOTE — ED Notes (Signed)
Pt reports tenderness with right lower palpation to abdomen.

## 2014-05-08 NOTE — Telephone Encounter (Signed)
Recvd report from Dr Thornton Papas regarding abd CT results: 9cm intususeption of transverse colon, lipoma as lead point  Spoke w/Dr Zella Richer of [facility], he recommends pt be worked up further as in-patient.   Discussed w/pt. Answered all questions.  Call placed to hospitalist.

## 2014-05-08 NOTE — Telephone Encounter (Signed)
Spoke w/hospitalist, Dr Catalina Antigua (sp?). Prefers pt to be evaluated in ER.  Advised pt to go to ER . She prefers [facility].  Spoke to [facility] ER charge RN, Advertising account planner, to brief about pt condition.

## 2014-05-09 ENCOUNTER — Encounter (HOSPITAL_COMMUNITY): Admission: EM | Disposition: A | Discharge status: Home / Self Care | Payer: Self-pay | Source: Home / Self Care

## 2014-05-09 ENCOUNTER — Inpatient Hospital Stay (HOSPITAL_COMMUNITY): Payer: Medicare Other | Admitting: Anesthesiology

## 2014-05-09 ENCOUNTER — Other Ambulatory Visit: Payer: Self-pay

## 2014-05-09 DIAGNOSIS — D374 Neoplasm of uncertain behavior of colon: Secondary | ICD-10-CM

## 2014-05-09 HISTORY — PX: COLON RESECTION: SHX5231

## 2014-05-09 LAB — SURGICAL PCR SCREEN
MRSA, PCR: NEGATIVE
Staphylococcus aureus: NEGATIVE

## 2014-05-09 SURGERY — COLON RESECTION LAPAROSCOPIC
Anesthesia: General | Site: Abdomen

## 2014-05-09 MED ORDER — SODIUM CHLORIDE 0.9 % IV SOLN: 250.0000 mL | INTRAVENOUS | Status: DC | PRN

## 2014-05-09 MED ORDER — FENTANYL CITRATE 0.05 MG/ML IJ SOLN
INTRAMUSCULAR | Status: AC
  Filled 2014-05-09: qty 2

## 2014-05-09 MED ORDER — MELATONIN 3 MG PO TABS: 3.0000 mg | ORAL_TABLET | Freq: Every evening | ORAL | Status: DC | PRN

## 2014-05-09 MED ORDER — CHLORHEXIDINE GLUCONATE 4 % EX LIQD: 60.0000 mL | Freq: Once | CUTANEOUS | Status: DC

## 2014-05-09 MED ORDER — HYDROMORPHONE HCL 1 MG/ML IJ SOLN
0.5000 mg | INTRAMUSCULAR | Status: DC | PRN
  Administered 2014-05-09 (×2): 1 mg via INTRAVENOUS
  Administered 2014-05-10 (×2): 0.5 mg via INTRAVENOUS
  Administered 2014-05-10: 1 mg via INTRAVENOUS
  Administered 2014-05-10 (×4): 0.5 mg via INTRAVENOUS
  Administered 2014-05-11: 1 mg via INTRAVENOUS
  Administered 2014-05-11 – 2014-05-12 (×2): 0.5 mg via INTRAVENOUS
  Filled 2014-05-09 (×12): qty 1

## 2014-05-09 MED ORDER — BUPIVACAINE-EPINEPHRINE (PF) 0.25% -1:200000 IJ SOLN
INTRAMUSCULAR | Status: AC
  Filled 2014-05-09: qty 30

## 2014-05-09 MED ORDER — MAGIC MOUTHWASH
15.0000 mL | Freq: Four times a day (QID) | ORAL | Status: DC | PRN
  Filled 2014-05-09: qty 15

## 2014-05-09 MED ORDER — ROCURONIUM BROMIDE 100 MG/10ML IV SOLN
INTRAVENOUS | Status: DC | PRN
  Administered 2014-05-09: 20 mg via INTRAVENOUS
  Administered 2014-05-09 (×2): 10 mg via INTRAVENOUS
  Administered 2014-05-09: 30 mg via INTRAVENOUS

## 2014-05-09 MED ORDER — BUPIVACAINE 0.25 % ON-Q PUMP DUAL CATH 300 ML
300.0000 mL | INJECTION | Status: DC
  Filled 2014-05-09: qty 300

## 2014-05-09 MED ORDER — LACTATED RINGERS IV SOLN
INTRAVENOUS | Status: DC
  Administered 2014-05-09 (×2): via INTRAVENOUS
  Administered 2014-05-09: 1000 mL via INTRAVENOUS
  Administered 2014-05-09: 12:00:00 via INTRAVENOUS

## 2014-05-09 MED ORDER — NEOMYCIN SULFATE 500 MG PO TABS
500.0000 mg | ORAL_TABLET | ORAL | Status: DC
  Filled 2014-05-09: qty 1

## 2014-05-09 MED ORDER — NEOMYCIN SULFATE 500 MG PO TABS
1000.0000 mg | ORAL_TABLET | ORAL | Status: AC
  Administered 2014-05-09: 1000 mg via ORAL
  Filled 2014-05-09: qty 2

## 2014-05-09 MED ORDER — ALVIMOPAN 12 MG PO CAPS
12.0000 mg | ORAL_CAPSULE | Freq: Once | ORAL | Status: AC
  Administered 2014-05-09: 12 mg via ORAL
  Filled 2014-05-09: qty 1

## 2014-05-09 MED ORDER — ALVIMOPAN 12 MG PO CAPS
12.0000 mg | ORAL_CAPSULE | Freq: Two times a day (BID) | ORAL | Status: DC
  Administered 2014-05-10 – 2014-05-11 (×3): 12 mg via ORAL
  Filled 2014-05-09 (×4): qty 1

## 2014-05-09 MED ORDER — SODIUM CHLORIDE 0.9 % IJ SOLN: 3.0000 mL | INTRAMUSCULAR | Status: DC | PRN

## 2014-05-09 MED ORDER — FENTANYL CITRATE 0.05 MG/ML IJ SOLN
INTRAMUSCULAR | Status: DC | PRN
  Administered 2014-05-09 (×5): 50 ug via INTRAVENOUS

## 2014-05-09 MED ORDER — EPHEDRINE SULFATE 50 MG/ML IJ SOLN
INTRAMUSCULAR | Status: DC | PRN
  Administered 2014-05-09 (×5): 10 mg via INTRAVENOUS

## 2014-05-09 MED ORDER — SACCHAROMYCES BOULARDII 250 MG PO CAPS
250.0000 mg | ORAL_CAPSULE | Freq: Two times a day (BID) | ORAL | Status: DC
  Administered 2014-05-09 – 2014-05-13 (×8): 250 mg via ORAL
  Filled 2014-05-09 (×9): qty 1

## 2014-05-09 MED ORDER — DEXAMETHASONE SODIUM PHOSPHATE 10 MG/ML IJ SOLN
INTRAMUSCULAR | Status: DC | PRN
  Administered 2014-05-09: 10 mg via INTRAVENOUS

## 2014-05-09 MED ORDER — LIP MEDEX EX OINT
1.0000 "application " | TOPICAL_OINTMENT | Freq: Two times a day (BID) | CUTANEOUS | Status: DC
  Administered 2014-05-09 – 2014-05-13 (×8): 1 via TOPICAL
  Filled 2014-05-09: qty 7

## 2014-05-09 MED ORDER — SODIUM CHLORIDE 0.9 % IJ SOLN
3.0000 mL | Freq: Two times a day (BID) | INTRAMUSCULAR | Status: DC
  Administered 2014-05-10 – 2014-05-13 (×4): 3 mL via INTRAVENOUS

## 2014-05-09 MED ORDER — ASPIRIN 81 MG PO TABS: 81.0000 mg | ORAL_TABLET | Freq: Every day | ORAL | Status: DC

## 2014-05-09 MED ORDER — BUPIVACAINE-EPINEPHRINE 0.25% -1:200000 IJ SOLN
INTRAMUSCULAR | Status: AC
  Filled 2014-05-09: qty 1

## 2014-05-09 MED ORDER — LIDOCAINE HCL (CARDIAC) 20 MG/ML IV SOLN
INTRAVENOUS | Status: AC
  Filled 2014-05-09: qty 5

## 2014-05-09 MED ORDER — SUCCINYLCHOLINE CHLORIDE 20 MG/ML IJ SOLN
INTRAMUSCULAR | Status: DC | PRN
  Administered 2014-05-09: 100 mg via INTRAVENOUS

## 2014-05-09 MED ORDER — DEXTROSE 5 % IV SOLN
10.0000 mg | INTRAVENOUS | Status: DC | PRN
  Administered 2014-05-09: 15 ug/min via INTRAVENOUS

## 2014-05-09 MED ORDER — HYDROMORPHONE HCL 1 MG/ML IJ SOLN
INTRAMUSCULAR | Status: AC
  Administered 2014-05-09: 0.5 mg
  Filled 2014-05-09: qty 1

## 2014-05-09 MED ORDER — ZOLPIDEM TARTRATE 5 MG PO TABS: 5.0000 mg | ORAL_TABLET | Freq: Every evening | ORAL | Status: DC | PRN

## 2014-05-09 MED ORDER — BUPIVACAINE-EPINEPHRINE 0.25% -1:200000 IJ SOLN
INTRAMUSCULAR | Status: DC | PRN
  Administered 2014-05-09: 50 mL

## 2014-05-09 MED ORDER — DEXTROSE 5 % IV SOLN
2.0000 g | Freq: Two times a day (BID) | INTRAVENOUS | Status: AC
  Administered 2014-05-10: 2 g via INTRAVENOUS
  Filled 2014-05-09: qty 2

## 2014-05-09 MED ORDER — PHENOL 1.4 % MT LIQD: 2.0000 | OROMUCOSAL | Status: DC | PRN

## 2014-05-09 MED ORDER — ONDANSETRON HCL 4 MG/2ML IJ SOLN
INTRAMUSCULAR | Status: AC
  Filled 2014-05-09: qty 2

## 2014-05-09 MED ORDER — LACTATED RINGERS IR SOLN
Status: DC | PRN
  Administered 2014-05-09: 1

## 2014-05-09 MED ORDER — ESCITALOPRAM OXALATE 20 MG PO TABS
20.0000 mg | ORAL_TABLET | Freq: Every day | ORAL | Status: DC
  Administered 2014-05-09 – 2014-05-13 (×5): 20 mg via ORAL
  Filled 2014-05-09 (×5): qty 1

## 2014-05-09 MED ORDER — LORAZEPAM 1 MG PO TABS
1.0000 mg | ORAL_TABLET | Freq: Two times a day (BID) | ORAL | Status: DC | PRN
  Administered 2014-05-10 – 2014-05-13 (×4): 1 mg via ORAL
  Filled 2014-05-09 (×4): qty 1

## 2014-05-09 MED ORDER — OXYCODONE HCL 5 MG PO TABS: 5.0000 mg | ORAL_TABLET | ORAL | Status: DC | PRN

## 2014-05-09 MED ORDER — PHENYLEPHRINE HCL 10 MG/ML IJ SOLN
INTRAMUSCULAR | Status: DC | PRN
  Administered 2014-05-09 (×2): 80 ug via INTRAVENOUS
  Administered 2014-05-09 (×3): 40 ug via INTRAVENOUS

## 2014-05-09 MED ORDER — DEXTROSE 5 % IV SOLN
2.0000 g | INTRAVENOUS | Status: AC
  Administered 2014-05-09: 2 g via INTRAVENOUS
  Filled 2014-05-09: qty 2

## 2014-05-09 MED ORDER — PROPOFOL 10 MG/ML IV BOLUS
INTRAVENOUS | Status: AC
  Filled 2014-05-09: qty 20

## 2014-05-09 MED ORDER — ALUM & MAG HYDROXIDE-SIMETH 200-200-20 MG/5ML PO SUSP: 30.0000 mL | Freq: Four times a day (QID) | ORAL | Status: DC | PRN

## 2014-05-09 MED ORDER — LEVOTHYROXINE SODIUM 100 MCG PO TABS
100.0000 ug | ORAL_TABLET | Freq: Every day | ORAL | Status: DC
  Administered 2014-05-10 – 2014-05-13 (×4): 100 ug via ORAL
  Filled 2014-05-09 (×6): qty 1

## 2014-05-09 MED ORDER — PHENYLEPHRINE 40 MCG/ML (10ML) SYRINGE FOR IV PUSH (FOR BLOOD PRESSURE SUPPORT)
PREFILLED_SYRINGE | INTRAVENOUS | Status: AC
  Filled 2014-05-09: qty 10

## 2014-05-09 MED ORDER — DEXAMETHASONE SODIUM PHOSPHATE 10 MG/ML IJ SOLN
INTRAMUSCULAR | Status: AC
  Filled 2014-05-09: qty 1

## 2014-05-09 MED ORDER — ONDANSETRON HCL 4 MG/2ML IJ SOLN
INTRAMUSCULAR | Status: DC | PRN
  Administered 2014-05-09: 4 mg via INTRAVENOUS

## 2014-05-09 MED ORDER — MIDAZOLAM HCL 2 MG/2ML IJ SOLN
INTRAMUSCULAR | Status: AC
  Filled 2014-05-09: qty 2

## 2014-05-09 MED ORDER — LACTATED RINGERS IV BOLUS (SEPSIS): 1000.0000 mL | Freq: Three times a day (TID) | INTRAVENOUS | Status: AC | PRN

## 2014-05-09 MED ORDER — EPHEDRINE SULFATE 50 MG/ML IJ SOLN
INTRAMUSCULAR | Status: AC
  Filled 2014-05-09: qty 1

## 2014-05-09 MED ORDER — ROCURONIUM BROMIDE 100 MG/10ML IV SOLN
INTRAVENOUS | Status: AC
  Filled 2014-05-09: qty 1

## 2014-05-09 MED ORDER — FENTANYL CITRATE 0.05 MG/ML IJ SOLN
INTRAMUSCULAR | Status: AC
  Filled 2014-05-09: qty 5

## 2014-05-09 MED ORDER — NEOSTIGMINE METHYLSULFATE 10 MG/10ML IV SOLN
INTRAVENOUS | Status: DC | PRN
  Administered 2014-05-09: 4 mg via INTRAVENOUS

## 2014-05-09 MED ORDER — ACETAMINOPHEN 500 MG PO TABS
1000.0000 mg | ORAL_TABLET | Freq: Three times a day (TID) | ORAL | Status: DC
  Administered 2014-05-09 – 2014-05-12 (×8): 1000 mg via ORAL
  Filled 2014-05-09 (×10): qty 2

## 2014-05-09 MED ORDER — LIDOCAINE HCL (CARDIAC) 20 MG/ML IV SOLN
INTRAVENOUS | Status: DC | PRN
  Administered 2014-05-09: 50 mg via INTRAVENOUS

## 2014-05-09 MED ORDER — DIPHENHYDRAMINE HCL 12.5 MG/5ML PO ELIX: 12.5000 mg | ORAL_SOLUTION | Freq: Four times a day (QID) | ORAL | Status: DC | PRN

## 2014-05-09 MED ORDER — ASPIRIN 81 MG PO CHEW
81.0000 mg | CHEWABLE_TABLET | Freq: Every day | ORAL | Status: DC
  Administered 2014-05-09 – 2014-05-13 (×5): 81 mg via ORAL
  Filled 2014-05-09 (×5): qty 1

## 2014-05-09 MED ORDER — MIDAZOLAM HCL 5 MG/5ML IJ SOLN
INTRAMUSCULAR | Status: DC | PRN
  Administered 2014-05-09 (×2): 1 mg via INTRAVENOUS

## 2014-05-09 MED ORDER — FENTANYL CITRATE 0.05 MG/ML IJ SOLN
25.0000 ug | INTRAMUSCULAR | Status: DC | PRN
  Administered 2014-05-09 (×2): 25 ug via INTRAVENOUS
  Administered 2014-05-09: 50 ug via INTRAVENOUS

## 2014-05-09 MED ORDER — VITAMIN D3 25 MCG (1000 UNIT) PO TABS
1000.0000 [IU] | ORAL_TABLET | Freq: Every day | ORAL | Status: DC
  Administered 2014-05-09 – 2014-05-13 (×5): 1000 [IU] via ORAL
  Filled 2014-05-09 (×5): qty 1

## 2014-05-09 MED ORDER — METRONIDAZOLE 500 MG PO TABS
1000.0000 mg | ORAL_TABLET | ORAL | Status: AC
  Filled 2014-05-09: qty 2

## 2014-05-09 MED ORDER — BUPROPION HCL ER (SR) 150 MG PO TB12
150.0000 mg | ORAL_TABLET | Freq: Two times a day (BID) | ORAL | Status: DC
  Administered 2014-05-09 – 2014-05-13 (×8): 150 mg via ORAL
  Filled 2014-05-09 (×9): qty 1

## 2014-05-09 MED ORDER — SODIUM CHLORIDE 0.9 % IV SOLN
INTRAVENOUS | Status: AC
  Administered 2014-05-09: 1000 mL via INTRAPERITONEAL
  Filled 2014-05-09: qty 6

## 2014-05-09 MED ORDER — SODIUM CHLORIDE 0.9 % IV SOLN
INTRAVENOUS | Status: DC
  Administered 2014-05-10 – 2014-05-12 (×4): via INTRAVENOUS

## 2014-05-09 MED ORDER — DIPHENHYDRAMINE HCL 50 MG/ML IJ SOLN: 12.5000 mg | Freq: Four times a day (QID) | INTRAMUSCULAR | Status: DC | PRN

## 2014-05-09 MED ORDER — PHENYLEPHRINE HCL 10 MG/ML IJ SOLN
INTRAMUSCULAR | Status: AC
  Filled 2014-05-09: qty 1

## 2014-05-09 MED ORDER — PROPOFOL 10 MG/ML IV BOLUS
INTRAVENOUS | Status: DC | PRN
  Administered 2014-05-09: 160 mg via INTRAVENOUS

## 2014-05-09 MED ORDER — MENTHOL 3 MG MT LOZG: 1.0000 | LOZENGE | OROMUCOSAL | Status: DC | PRN

## 2014-05-09 MED ORDER — BUPIVACAINE-EPINEPHRINE (PF) 0.25% -1:200000 IJ SOLN
INTRAMUSCULAR | Status: AC
  Filled 2014-05-09: qty 60

## 2014-05-09 MED ORDER — 0.9 % SODIUM CHLORIDE (POUR BTL) OPTIME
TOPICAL | Status: DC | PRN
  Administered 2014-05-09 (×2): 1000 mL

## 2014-05-09 MED ORDER — GLYCOPYRROLATE 0.2 MG/ML IJ SOLN
INTRAMUSCULAR | Status: DC | PRN
  Administered 2014-05-09: 0.6 mg via INTRAVENOUS

## 2014-05-09 MED ORDER — METOPROLOL TARTRATE 1 MG/ML IV SOLN
5.0000 mg | Freq: Four times a day (QID) | INTRAVENOUS | Status: DC
  Filled 2014-05-09 (×19): qty 5

## 2014-05-09 MED ORDER — METRONIDAZOLE 500 MG PO TABS
500.0000 mg | ORAL_TABLET | ORAL | Status: DC
  Filled 2014-05-09: qty 1

## 2014-05-09 MED ORDER — METRONIDAZOLE 500 MG PO TABS
1000.0000 mg | ORAL_TABLET | ORAL | Status: AC
  Administered 2014-05-09: 1000 mg via ORAL
  Filled 2014-05-09: qty 2

## 2014-05-09 MED ORDER — SIMVASTATIN 10 MG PO TABS
10.0000 mg | ORAL_TABLET | Freq: Every day | ORAL | Status: DC
  Administered 2014-05-09 – 2014-05-12 (×4): 10 mg via ORAL
  Filled 2014-05-09 (×5): qty 1

## 2014-05-09 SURGICAL SUPPLY — 71 items
APPLIER CLIP 5 13 M/L LIGAMAX5 (MISCELLANEOUS)
APPLIER CLIP ROT 10 11.4 M/L (STAPLE)
APR CLP MED LRG 11.4X10 (STAPLE)
APR CLP MED LRG 5 ANG JAW (MISCELLANEOUS)
BLADE EXTENDED COATED 6.5IN (ELECTRODE) ×2 IMPLANT
BLADE HEX COATED 2.75 (ELECTRODE) ×4 IMPLANT
CABLE HIGH FREQUENCY MONO STRZ (ELECTRODE) ×2 IMPLANT
CATH KIT ON-Q SILVERSOAK 7.5 (CATHETERS) IMPLANT
CATH KIT ON-Q SILVERSOAK 7.5IN (CATHETERS) ×2 IMPLANT
CELLS DAT CNTRL 66122 CELL SVR (MISCELLANEOUS) IMPLANT
CLIP APPLIE 5 13 M/L LIGAMAX5 (MISCELLANEOUS) IMPLANT
CLIP APPLIE ROT 10 11.4 M/L (STAPLE) IMPLANT
CLOSURE STERI-STRIP 1/4X4 (GAUZE/BANDAGES/DRESSINGS) ×1 IMPLANT
COUNTER NEEDLE 20 DBL MAG RED (NEEDLE) ×2 IMPLANT
DECANTER SPIKE VIAL GLASS SM (MISCELLANEOUS) ×2 IMPLANT
DRAIN CHANNEL 19F RND (DRAIN) IMPLANT
DRAPE LAPAROSCOPIC ABDOMINAL (DRAPES) ×2 IMPLANT
DRAPE UTILITY XL STRL (DRAPES) ×4 IMPLANT
DRSG OPSITE POSTOP 4X10 (GAUZE/BANDAGES/DRESSINGS) IMPLANT
DRSG OPSITE POSTOP 4X6 (GAUZE/BANDAGES/DRESSINGS) IMPLANT
DRSG OPSITE POSTOP 4X8 (GAUZE/BANDAGES/DRESSINGS) IMPLANT
DRSG TEGADERM 2-3/8X2-3/4 SM (GAUZE/BANDAGES/DRESSINGS) ×3 IMPLANT
DRSG TEGADERM 4X4.75 (GAUZE/BANDAGES/DRESSINGS) ×2 IMPLANT
ELECT REM PT RETURN 9FT ADLT (ELECTROSURGICAL) ×2
ELECTRODE REM PT RTRN 9FT ADLT (ELECTROSURGICAL) ×1 IMPLANT
ENDOLOOP SUT PDS II  0 18 (SUTURE)
ENDOLOOP SUT PDS II 0 18 (SUTURE) IMPLANT
GAUZE SPONGE 2X2 8PLY STRL LF (GAUZE/BANDAGES/DRESSINGS) IMPLANT
GAUZE SPONGE 4X4 12PLY STRL (GAUZE/BANDAGES/DRESSINGS) ×2 IMPLANT
GLOVE ECLIPSE 8.0 STRL XLNG CF (GLOVE) ×4 IMPLANT
GLOVE INDICATOR 8.0 STRL GRN (GLOVE) ×4 IMPLANT
GOWN STRL REUS W/TWL XL LVL3 (GOWN DISPOSABLE) ×8 IMPLANT
LEGGING LITHOTOMY PAIR STRL (DRAPES) ×2 IMPLANT
LUBRICANT JELLY K Y 4OZ (MISCELLANEOUS) IMPLANT
PACK COLON (CUSTOM PROCEDURE TRAY) ×2 IMPLANT
PORT LAP GEL ALEXIS MED 5-9CM (MISCELLANEOUS) ×1 IMPLANT
RELOAD PROXIMATE 75MM BLUE (ENDOMECHANICALS) ×4 IMPLANT
RELOAD STAPLE 75 3.8 BLU REG (ENDOMECHANICALS) IMPLANT
RETRACTOR WND ALEXIS 18 MED (MISCELLANEOUS) IMPLANT
RTRCTR WOUND ALEXIS 18CM MED (MISCELLANEOUS)
SCISSORS LAP 5X35 DISP (ENDOMECHANICALS) ×2 IMPLANT
SEALER TISSUE G2 CVD JAW 35 (ENDOMECHANICALS) IMPLANT
SEALER TISSUE G2 CVD JAW 45CM (ENDOMECHANICALS)
SEALER TISSUE G2 STRG ARTC 35C (ENDOMECHANICALS) ×1 IMPLANT
SET IRRIG TUBING LAPAROSCOPIC (IRRIGATION / IRRIGATOR) ×2 IMPLANT
SLEEVE XCEL OPT CAN 5 100 (ENDOMECHANICALS) ×6 IMPLANT
SPONGE GAUZE 2X2 STER 10/PKG (GAUZE/BANDAGES/DRESSINGS) ×1
STAPLER PROXIMATE 75MM BLUE (STAPLE) ×1 IMPLANT
STAPLER VISISTAT 35W (STAPLE) ×2 IMPLANT
SUT MNCRL AB 4-0 PS2 18 (SUTURE) ×3 IMPLANT
SUT PDS AB 1 CTX 36 (SUTURE) ×2 IMPLANT
SUT PDS AB 1 TP1 96 (SUTURE) IMPLANT
SUT PROLENE 0 CT 2 (SUTURE) IMPLANT
SUT SILK 2 0 (SUTURE) ×2
SUT SILK 2 0 SH CR/8 (SUTURE) ×4 IMPLANT
SUT SILK 2-0 18XBRD TIE 12 (SUTURE) ×1 IMPLANT
SUT SILK 3 0 (SUTURE) ×2
SUT SILK 3 0 SH CR/8 (SUTURE) ×2 IMPLANT
SUT SILK 3-0 18XBRD TIE 12 (SUTURE) ×1 IMPLANT
SUT VLOC BARB 180 ABS3/0GR12 (SUTURE) ×2
SUTURE VLOC BRB 180 ABS3/0GR12 (SUTURE) IMPLANT
SYS LAPSCP GELPORT 120MM (MISCELLANEOUS)
SYSTEM LAPSCP GELPORT 120MM (MISCELLANEOUS) IMPLANT
TAPE UMBILICAL COTTON 1/8X30 (MISCELLANEOUS) ×1 IMPLANT
TOWEL OR NON WOVEN STRL DISP B (DISPOSABLE) ×4 IMPLANT
TRAY FOLEY CATH 14FRSI W/METER (CATHETERS) ×2 IMPLANT
TROCAR BLADELESS OPT 5 100 (ENDOMECHANICALS) ×2 IMPLANT
TROCAR XCEL NON-BLD 11X100MML (ENDOMECHANICALS) ×2 IMPLANT
TUBING CONNECTING 10 (TUBING) IMPLANT
TUBING FILTER THERMOFLATOR (ELECTROSURGICAL) ×2 IMPLANT
TUNNELER SHEATH ON-Q 16GX12 DP (PAIN MANAGEMENT) ×1 IMPLANT

## 2014-05-09 NOTE — Anesthesia Postprocedure Evaluation (Signed)
  Anesthesia Post-op Note  Patient: Kendra Ball  Procedure(s) Performed: Procedure(s): LAPAROSCOPIC HAND-ASSISTED EXTENDED RIGHT COLECTOMY, LAPAROSCOPIC LYSIS OF ADHESIONS, SPLENIC FLEXURE MOBILIZATION. (N/A) Patient is awake and responsive. Pain and nausea are reasonably well controlled. Vital signs are stable and clinically acceptable. Oxygen saturation is clinically acceptable. There are no apparent anesthetic complications at this time. Patient is ready for discharge.

## 2014-05-09 NOTE — Transfer of Care (Signed)
Immediate Anesthesia Transfer of Care Note  Patient: Kendra Ball  Procedure(s) Performed: Procedure(s): LAPAROSCOPIC HAND-ASSISTED TRANSVERSE COLON RESECTION  (N/A)  Patient Location: PACU  Anesthesia Type:General  Level of Consciousness:  Airway & Oxygen Therapy:   Post-op Assessment:  Post vital signs:   Complications:

## 2014-05-09 NOTE — Discharge Instructions (Signed)
ABDOMINAL SURGERY: POST OP INSTRUCTIONS  1. DIET: Follow a light bland diet the first 24 hours after arrival home, such as soup, liquids, crackers, etc.  Be sure to include lots of fluids daily.  Avoid fast food or heavy meals as your are more likely to get nauseated.  Eat a low fat the next few days after surgery.   2. Take your usually prescribed home medications unless otherwise directed. 3. PAIN CONTROL: a. Pain is best controlled by a usual combination of three different methods TOGETHER: i. Ice/Heat ii. Over the counter pain medication iii. Prescription pain medication b. Most patients will experience some swelling and bruising around the incisions.  Ice packs or heating pads (30-60 minutes up to 6 times a day) will help. Use ice for the first few days to help decrease swelling and bruising, then switch to heat to help relax tight/sore spots and speed recovery.  Some people prefer to use ice alone, heat alone, alternating between ice & heat.  Experiment to what works for you.  Swelling and bruising can take several weeks to resolve.   c. It is helpful to take an over-the-counter pain medication regularly for the first few weeks.  Choose one of the following that works best for you: i. Naproxen (Aleve, etc)  Two 228m tabs twice a day ii. Ibuprofen (Advil, etc) Three 2071mtabs four times a day (every meal & bedtime) iii. Acetaminophen (Tylenol, etc) 500-65061mour times a day (every meal & bedtime) d. A  prescription for pain medication (such as oxycodone, hydrocodone, etc) should be given to you upon discharge.  Take your pain medication as prescribed.  i. If you are having problems/concerns with the prescription medicine (does not control pain, nausea, vomiting, rash, itching, etc), please call us [phone] see if we need to switch you to a different pain medicine that will work better for you and/or control your side effect better. ii. If you need a refill on your pain medication,  please contact your pharmacy.  They will contact our office to request authorization. Prescriptions will not be filled after 5 pm or on week-ends. 4. Avoid getting constipated.  Between the surgery and the pain medications, it is common to experience some constipation.  Increasing fluid intake and taking a fiber supplement (such as Metamucil, Citrucel, FiberCon, MiraLax, etc) 1-2 times a day regularly will usually help prevent this problem from occurring.  A mild laxative (prune juice, Milk of Magnesia, MiraLax, etc) should be taken according to package directions if there are no bowel movements after 48 hours.   5. Watch out for diarrhea.  If you have many loose bowel movements, simplify your diet to bland foods & liquids for a few days.  Stop any stool softeners and decrease your fiber supplement.  Switching to mild anti-diarrheal medications (Kayopectate, Pepto Bismol) can help.  If this worsens or does not improve, please call us.[place]. Wash / shower every day.  You may shower over the incision / wound.  Avoid baths until the skin is fully healed.  Continue to shower over incision(s) after the dressing is off. 7. Remove your waterproof bandages 5 days after surgery.  You may leave the incision open to air.  Remove any wicks or ribbons in your wound.  If you have an open wound, please see wound care instructions. You may replace a dressing/Band-Aid to cover the incision for comfort if you wish. 8. ACTIVITIES as tolerated:   a. You may resume regular (light)  daily activities beginning the next day--such as daily self-care, walking, climbing stairs--gradually increasing activities as tolerated.  If you can walk 30 minutes without difficulty, it is safe to try more intense activity such as jogging, treadmill, bicycling, low-impact aerobics, swimming, etc. b. Save the most intensive and strenuous activity for last such as sit-ups, heavy lifting, contact sports, etc  Refrain from any heavy lifting or straining  until you are off narcotics for pain control.   c. DO NOT PUSH THROUGH PAIN.  Let pain be your guide: If it hurts to do something, don't do it.  Pain is your body warning you to avoid that activity for another week until the pain goes down. d. You may drive when you are no longer taking prescription pain medication, you can comfortably wear a seatbelt, and you can safely maneuver your car and apply brakes. e. Dennis Bast may have sexual intercourse when it is comfortable.  9. FOLLOW UP in our office a. Please call [facility] at [phone] to set up an appointment to see your surgeon in the office for a follow-up appointment approximately 1-2 weeks after your surgery. b. Make sure that you call for this appointment the day you arrive home to insure a convenient appointment time. 10. IF YOU HAVE DISABILITY OR FAMILY LEAVE FORMS, BRING THEM TO THE OFFICE FOR PROCESSING.  DO NOT GIVE THEM TO YOUR DOCTOR.   WHEN TO CALL us [phone]: 1. Poor pain control 2. Reactions / problems with new medications (rash/itching, nausea, etc)  3. Fever over 101.5 F (38.5 C) 4. Inability to urinate 5. Nausea and/or vomiting 6. Worsening swelling or bruising 7. Continued bleeding from incision. 8. Increased pain, redness, or drainage from the incision  The clinic staff is available to answer your questions during regular business hours (8:30am-5pm).  Please dont hesitate to call and ask to speak to one of our nurses for clinical concerns.   A surgeon from [facility] is always on call at the hospitals   If you have a medical emergency, go to the nearest emergency room or call 911.    [facility], [place], [place], [place], [place]  [address] ? MAIN: [phone] ? TOLL FREE: [phone] ? FAX [phone] [email or web address]  GETTING TO GOOD BOWEL HEALTH. Irregular bowel habits such as constipation and diarrhea can lead to many problems over time.   Having one soft bowel movement a day is the most important way to prevent further problems.  The anorectal canal is designed to handle stretching and feces to safely manage our ability to get rid of solid waste (feces, poop, stool) out of our body.  BUT, hard constipated stools can act like ripping concrete bricks and diarrhea can be a burning fire to this very sensitive area of our body, causing inflamed hemorrhoids, anal fissures, increasing risk is perirectal abscesses, abdominal pain/bloating, an making irritable bowel worse.     The goal: ONE SOFT BOWEL MOVEMENT A DAY!  To have soft, regular bowel movements:   Drink at least 8 tall glasses of water a day.    Take plenty of fiber.  Fiber is the undigested part of plant food that passes into the colon, acting s natures broom to encourage bowel motility and movement.  Fiber can absorb and hold large amounts of water. This results in a larger, bulkier stool, which is soft and easier to pass. Work gradually over several weeks up to 6 servings a day of fiber (  25g a day even more if needed) in the form of: o Vegetables -- Root (potatoes, carrots, turnips), leafy green (lettuce, salad greens, celery, spinach), or cooked high residue (cabbage, broccoli, etc) o Fruit -- Fresh (unpeeled skin & pulp), Dried (prunes, apricots, cherries, etc ),  or stewed ( applesauce)  o Whole grain breads, pasta, etc (whole wheat)  o Bran cereals   Bulking Agents -- This type of water-retaining fiber generally is easily obtained each day by one of the following:  o Psyllium bran -- The psyllium plant is remarkable because its ground seeds can retain so much water. This product is available as Metamucil, Konsyl, Effersyllium, Per Diem Fiber, or the less expensive generic preparation in drug and health food stores. Although labeled a laxative, it really is not a laxative.  o Methylcellulose -- This is another fiber derived from wood which also retains water. It is available as  Citrucel. o Polyethylene Glycol - and artificial fiber commonly called Miralax or Glycolax.  It is helpful for people with gassy or bloated feelings with regular fiber o Flax Seed - a less gassy fiber than psyllium  No reading or other relaxing activity while on the toilet. If bowel movements take longer than 5 minutes, you are too constipated  AVOID CONSTIPATION.  High fiber and water intake usually takes care of this.  Sometimes a laxative is needed to stimulate more frequent bowel movements, but   Laxatives are not a good long-term solution as it can wear the colon out. o Osmotics (Milk of Magnesia, Fleets phosphosoda, Magnesium citrate, MiraLax, GoLytely) are safer than  o Stimulants (Senokot, Castor Oil, Dulcolax, Ex Lax)    o Do not take laxatives for more than 7days in a row.   IF SEVERELY CONSTIPATED, try a Bowel Retraining Program: o Do not use laxatives.  o Eat a diet high in roughage, such as bran cereals and leafy vegetables.  o Drink six (6) ounces of prune or apricot juice each morning.  o Eat two (2) large servings of stewed fruit each day.  o Take one (1) heaping tablespoon of a psyllium-based bulking agent twice a day. Use sugar-free sweetener when possible to avoid excessive calories.  o Eat a normal breakfast.  o Set aside 15 minutes after breakfast to sit on the toilet, but do not strain to have a bowel movement.  o If you do not have a bowel movement by the third day, use an enema and repeat the above steps.   Controlling diarrhea o Switch to liquids and simpler foods for a few days to avoid stressing your intestines further. o Avoid dairy products (especially milk & ice cream) for a short time.  The intestines often can lose the ability to digest lactose when stressed. o Avoid foods that cause gassiness or bloating.  Typical foods include beans and other legumes, cabbage, broccoli, and dairy foods.  Every person has some sensitivity to other foods, so listen to our  body and avoid those foods that trigger problems for you. o Adding fiber (Citrucel, Metamucil, psyllium, Miralax) gradually can help thicken stools by absorbing excess fluid and retrain the intestines to act more normally.  Slowly increase the dose over a few weeks.  Too much fiber too soon can backfire and cause cramping & bloating. o Probiotics (such as active yogurt, Align, etc) may help repopulate the intestines and colon with normal bacteria and calm down a sensitive digestive tract.  Most studies show it to be of mild help,  though, and such products can be costly. o Medicines: - Bismuth subsalicylate (ex. Kayopectate, Pepto Bismol) every 30 minutes for up to 6 doses can help control diarrhea.  Avoid if pregnant. - Loperamide (Immodium) can slow down diarrhea.  Start with two tablets (4mg  total) first and then try one tablet every 6 hours.  Avoid if you are having fevers or severe pain.  If you are not better or start feeling worse, stop all medicines and call your doctor for advice o Call your doctor if you are getting worse or not better.  Sometimes further testing (cultures, endoscopy, X-ray studies, bloodwork, etc) may be needed to help diagnose and treat the cause of the diarrhea.  Managing Pain  Pain after surgery or related to activity is often due to strain/injury to muscle, tendon, nerves and/or incisions.  This pain is usually short-term and will improve in a few months.   Many people find it helpful to do the following things TOGETHER to help speed the process of healing and to get back to regular activity more quickly:  1. Avoid heavy physical activity a. No lifting greater than 20 pounds at first b. Do not push through the pain.  Listen to your body and avoid positions and maneuvers than reproduce the pain c. Walking is okay as tolerated, but go slowly and stop when getting sore.  d. Remember: If it hurts to do it, then dont do it! 2. Take Anti-inflammatory medication   a. Take with food/snack around the clock for 1-2 weeks i. This helps the muscle and nerve tissues become less irritable and calm down faster ii. Choose acetaminophen 500mg  tabs (Tylenol) 1-2 pills with every meal and just before bedtime 3. Use a Heating pad or Ice/Cold Pack a. 4-6 times a day b. May use warm bath/hottub  or showers 4. Try Gentle Massage and/or Stretching  a. at the area of pain many times a day b. stop if you feel pain - do not overdo it  Try these steps together to help you body heal faster and avoid making things get worse.  Doing just one of these things may not be enough.    If you are not getting better after two weeks or are noticing you are getting worse, contact our office for further advice; we may need to re-evaluate you & see what other things we can do to help.

## 2014-05-09 NOTE — Progress Notes (Signed)
[facility]  [address] [place], [place] [address] Phone: [phone] FAX: [place] [phone] 07/20/1945  CARE TEAM:  PCP: Irene Pap, NP  Outpatient Care Team: Patient Care Team: Irene Pap, NP as PCP - General (Nurse Practitioner) Juanita Craver, MD as Consulting Physician (Gastroenterology) Excell Seltzer, MD as Consulting Physician (General Surgery)  Inpatient Treatment Team: Treatment Team: Attending Provider: Nolon Nations, MD; Registered Nurse: Kristopher Oppenheim, RN   Subjective:  Patient feeling better.  Had crampy diarrhea after CT contrast.  Has some mild abdominal soreness.  Notes that every time she eats or drinks she gets severe crampy pain.  Essentially been drinking liquids for the past few days.  Objective:  Vital signs:  Filed Vitals:   05/08/14 1857 05/08/14 2205 05/09/14 0649 05/09/14 0846  BP: 117/72 130/54 136/68 132/57  Pulse: 64 65 68 60  Temp:  98.2 F (36.8 C) 98.8 F (37.1 C) 97.6 F (36.4 C)  TempSrc:  Oral Oral Oral  Resp: _0 Height:  5' 3.5" (1.613 m)    Weight:  196 lb 6.4 oz (89.086 kg) 196 lb 12.2 oz (89.25 kg)   SpO2: 97% 97% 98% 96%    Last BM Date: 05/08/14  Intake/Output   Yesterday:  01/14 0701 - 01/15 0700 In: 450 [P.O.:100; I.V.:350] Out: 801 [Urine:800; Stool:1] This shift:     Bowel function:  Flatus: y  BM: y  Drain: n/a  Physical Exam:  General: Pt awake/alert/oriented x4 in no acute distress Eyes: PERRL, normal EOM.  Sclera clear.  No icterus Neuro: CN II-XII intact w/o focal sensory/motor deficits. Lymph: No head/neck/groin lymphadenopathy Psych:  No delerium/psychosis/paranoia HENT: Normocephalic, Mucus membranes moist.  No thrush Neck: Supple, No tracheal deviation Chest: No chest wall pain w good excursion CV:  Pulses intact.  Regular rhythm MS: Normal AROM mjr joints.  No obvious deformity Abdomen: Soft.   Nondistended.  Obese Mildly tender at R sise only.  No evidence of peritonitis.  No incarcerated hernias. Ext:  SCDs BLE.  No mjr edema.  No cyanosis Skin: No petechiae / purpura   Problem List:   Principal Problem:   Intussusception of colon Active Problems:   Colonic intussusception   Assessment  Kendra Ball  69 y.o. 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 Indwelling by mouth tolerance.  Plan:  Think the patient requires surgery.  Most likely will require segmental resection of intussusception with the lipoma.  See if he can be reduced in traction minimize resection.  Challenge in a patient with a prior laparoscopic gastric bypass.  We will try laparoscopically but patient understands higher risk of conversion to open given prior surgeries.  Because of her inability to tolerate even liquids and already on liquids and artery had pretty well cleaned out from the CT contrast, I do not see her being able to about tolerate a full bowel prep.  We will give oral and IV antibiotics as possible.  She understands there is a risk of colostomy but hopefully we can minimize that rest.  She does not look massively distended proximally.  Discussed with the patient and her family:  The anatomy & physiology of the digestive tract was discussed.  The pathophysiology of the colon was discussed.  Natural history risks without surgery was discussed.  I feel the risks of no intervention will lead to serious problems that outweigh the operative risks; therefore, I recommended a partial colectomy to remove the pathology.  Minimally invasive (Robotic/Laparoscopic) & open techniques were discussed.   Risks such as bleeding, infection, abscess, leak, reoperation, possible ostomy, hernia, heart attack, stroke, death, and other risks were discussed.   I noted a good likelihood this will help address the problem.   Goals of post-operative recovery were discussed as well.   Need for adequate nutrition, daily bowel regimen and healthy physical activity, to optimize recovery was noted as well. We will work to minimize complications.  Educational materials were available as well.  Questions were answered.  The patient expresses understanding & wishes to proceed with surgery.  -VTE prophylaxis- SCDs, etc -mobilize as tolerated to help recovery  Adin Hector, M.D., F.A.C.S. Gastrointestinal and Minimally Invasive Surgery [facility] [address], [place], [place] [address] [phone] Main / Paging   05/09/2014   Results:   Labs: Results for orders placed or performed during the hospital encounter of 05/08/14 (from the past 48 hour(s))  Comprehensive metabolic panel     Status: Abnormal   Collection Time: 05/08/14  7:57 PM  Result Value Ref Range   Sodium 141 135 - 145 mmol/L    Comment: Please note change in reference range.   Potassium 3.7 3.5 - 5.1 mmol/L    Comment: Please note change in reference range.   Chloride 103 96 - 112 mEq/L   CO2 29 19 - 32 mmol/L   Glucose, Bld 83 70 - 99 mg/dL   BUN 5 (L) 6 - 23 mg/dL   Creatinine, Ser 0.65 0.50 - 1.10 mg/dL   Calcium 8.8 8.4 - 10.5 mg/dL   Total Protein 6.6 6.0 - 8.3 g/dL   Albumin 3.4 (L) 3.5 - 5.2 g/dL   AST 17 0 - 37 U/L   ALT 12 0 - 35 U/L   Alkaline Phosphatase 54 39 - 117 U/L   Total Bilirubin 0.5 0.3 - 1.2 mg/dL   GFR calc non Af Amer 90 (L) >90 mL/min   GFR calc Af Amer >90 >90 mL/min    Comment: (NOTE) The eGFR has been calculated using the CKD EPI equation. This calculation has not been validated in all clinical situations. eGFR's persistently <90 mL/min signify possible Chronic Kidney Disease.    Anion gap 9 5 - 15  CBC with Differential     Status: Abnormal   Collection Time: 05/08/14  7:57 PM  Result Value Ref Range    WBC 9.7 4.0 - 10.5 K/uL   RBC 4.07 3.87 - 5.11 MIL/uL   Hemoglobin 12.8 12.0 - 15.0 g/dL   HCT 39.9 36.0 - 46.0 %   MCV 98.0 78.0 - 100.0 fL   MCH 31.4 26.0 - 34.0 pg   MCHC 32.1 30.0 - 36.0 g/dL   RDW 13.6 11.5 - 15.5 %   Platelets 466 (H) 150 - 400 K/uL   Neutrophils Relative % 66 43 - 77 %   Neutro Abs 6.3 1.7 - 7.7 K/uL   Lymphocytes Relative 27 12 - 46 %   Lymphs Abs 2.6 0.7 - 4.0 K/uL   Monocytes Relative 6 3 - 12 %   Monocytes Absolute 0.6 0.1 - 1.0 K/uL   Eosinophils Relative 1 0 - 5 %   Eosinophils Absolute 0.1 0.0 - 0.7 K/uL   Basophils Relative 0 0 - 1 %  Basophils Absolute 0.0 0.0 - 0.1 K/uL  Urinalysis, Routine w reflex microscopic     Status: Abnormal   Collection Time: 05/08/14  8:23 PM  Result Value Ref Range   Color, Urine YELLOW YELLOW   APPearance CLOUDY (A) CLEAR   Specific Gravity, Urine 1.012 1.005 - 1.030   pH 7.0 5.0 - 8.0   Glucose, UA NEGATIVE NEGATIVE mg/dL   Hgb urine dipstick TRACE (A) NEGATIVE   Bilirubin Urine NEGATIVE NEGATIVE   Ketones, ur NEGATIVE NEGATIVE mg/dL   Protein, ur NEGATIVE NEGATIVE mg/dL   Urobilinogen, UA 0.2 0.0 - 1.0 mg/dL   Nitrite NEGATIVE NEGATIVE   Leukocytes, UA NEGATIVE NEGATIVE  Urine microscopic-add on     Status: None   Collection Time: 05/08/14  8:23 PM  Result Value Ref Range   Squamous Epithelial / LPF RARE RARE   RBC / HPF 0-2 <3 RBC/hpf    Imaging / Studies: Ct Abdomen Pelvis W Contrast  05/08/2014   CLINICAL DATA:  Generalized abdominal pain, burning in pelvic area since Thanksgiving, constipation, early satiety, past history of gastric bypass surgery, Cesarean section, hypertension, colon polyps, smoking, family history colon cancer  EXAM: CT ABDOMEN AND PELVIS WITH CONTRAST  TECHNIQUE: Multidetector CT imaging of the abdomen and pelvis was performed using the standard protocol following bolus administration of intravenous contrast. Sagittal and coronal MPR images reconstructed from axial data set.  CONTRAST:   162m OMNIPAQUE IOHEXOL 300 MG/ML SOLN IV. Dilute oral contrast.  COMPARISON:  07/12/2010  FINDINGS: Lung bases clear.  Liver, spleen, pancreas, kidneys, and adrenal glands normal.  Scattered atherosclerotic calcifications without aneurysm.  9 cm length segment of colo-colic intussusception at the mid to distal transverse colon with a discrete fat attenuation mass as lead point measuring 3.9 x 3.2 x 3.0 cm compatible with transverse colon lipoma, noted on previous exam.  Associated wall thickening of the transverse colon with minimal pericolic inflammation.  Proximal transverse colon and ascending colon appear distended without evidence of small bowel dilatation/obstruction.  Contrast is present within the descending colon and proximal sigmoid colon.  Additional lipoma versus stool artifact at the mid descending colon 19 x 17 x 25 mm.  Prior gastric bypass surgery.  Small bowel loops unremarkable.  No additional mass, adenopathy, free fluid, or free air.  Normal appendix.  Unremarkable bladder, ureters, uterus, and ovaries with prominent venous structures in LEFT adnexa, extending to the LEFT ovarian vein, nonspecific but can be seen with pelvic congestion.  Osseous demineralization.  IMPRESSION: 9 cm length segment of colo-colic intussusception involving the mid to distal transverse colon with a 3.9 x 3.2 x 3.0 cm diameter transverse colonic lipoma as lead point.  Associated bowel wall thickening with dilatation of colon proximal to the intussusception without evidence of small bowel obstruction.  Question additional lipoma at the descending colon 19 x 17 x 25 mm.  Prior gastric bypass surgery.  Findings called to LNicky PughNP on 05/08/2014 at 1458 hr.   Electronically Signed   By: MLavonia DanaM.D.   On: 05/08/2014 15:00    Medications / Allergies: per chart  Antibiotics: Anti-infectives    Start     Dose/Rate Route Frequency Ordered Stop   05/09/14 1000  [MAR Hold]  metroNIDAZOLE (FLAGYL) tablet 1,000  mg     (MAR Hold since 05/09/14 0955)   1,000 mg Oral On call 05/09/14 0846 05/10/14 1000   05/09/14 1000  [MAR Hold]  neomycin (MYCIFRADIN) tablet 1,000 mg     (  MAR Hold since 05/09/14 0955)   1,000 mg Oral On call 05/09/14 0846 05/09/14 1018   05/09/14 0930  metroNIDAZOLE (FLAGYL) tablet 500 mg  Status:  Discontinued    Comments:  Take 2 pills (=1039m) STAT & on call to OR   500 mg Oral As directed 05/09/14 0828 05/09/14 0846   05/09/14 0900  neomycin (MYCIFRADIN) tablet 500 mg  Status:  Discontinued    Comments:  Take 2 pills (=10078m STAT & on call to OR   500 mg Oral STAT 05/09/14 0828 05/09/14 0846   05/09/14 0900  metroNIDAZOLE (FLAGYL) tablet 1,000 mg    Comments:  Take 2 pills (=100069mSTAT & on call to OR   1,000 mg Oral STAT 05/09/14 0846 05/09/14 0912   05/09/14 0900  neomycin (MYCIFRADIN) tablet 1,000 mg     1,000 mg Oral STAT 05/09/14 0846 05/09/14 0912   05/09/14 0830  cefoTEtan (CEFOTAN) 2 g in dextrose 5 % 50 mL IVPB     2 g100 mL/hr over 30 Minutes Intravenous On call to O.R. 05/09/14 0826999/16/16 0559       Note: Portions of this report may have been transcribed using voice recognition software. Every effort was made to ensure accuracy; however, inadvertent computerized transcription errors may be present.   Any transcriptional errors that result from this process are unintentional.

## 2014-05-09 NOTE — Progress Notes (Signed)
PHARMACIST - PHYSICIAN ORDER COMMUNICATION  TOPIC: P&T Medication Policy on Herbal Medications  DISCUSSION:  This patient's order for Melatonin has been noted.  This product is classified as an "herbal" or natural product.  The Pharmacy and Therapeutics Committee does not permit the use of "herbal" or natural products of this type within [facility].  This policy was adopted due to a lack of definitive safety studies, lack of FDA approval, nonstandard manufacturing practices, and the potential risk of unknown drug-drug interactions with inpatient medications   ACTION TAKEN: The order for Melatonin has been discontinued.  At discharge, please determine whether or not  the order for Melatonin should be restarted.  [place]Ph. 05/09/2014 7:10 PM

## 2014-05-09 NOTE — Transfer of Care (Signed)
Immediate Anesthesia Transfer of Care Note  Patient: Kendra Ball  Procedure(s) Performed: Procedure(s): LAPAROSCOPIC HAND-ASSISTED EXTENDED RIGHT COLECTOMY, LAPAROSCOPIC LYSIS OF ADHESIONS, SPLENIC FLEXURE MOBILIZATION. (N/A)  Patient Location: PACU  Anesthesia Type:General  Level of Consciousness: awake, alert  and oriented  Airway & Oxygen Therapy: Patient Spontanous Breathing and Patient connected to face mask oxygen  Post-op Assessment: Report given to PACU RN and Post -op Vital signs reviewed and stable  Post vital signs: Reviewed and stable  Complications: No apparent anesthesia complications

## 2014-05-09 NOTE — Anesthesia Postprocedure Evaluation (Signed)
  Anesthesia Post-op Note  Patient: Kendra Ball  Procedure(s) Performed: Procedure(s): LAPAROSCOPIC HAND-ASSISTED EXTENDED RIGHT COLECTOMY, LAPAROSCOPIC LYSIS OF ADHESIONS, SPLENIC FLEXURE MOBILIZATION. (N/A)  Patient Location: PACU  Anesthesia Type:General  Level of Consciousness: awake and alert   Airway and Oxygen Therapy: Patient Spontanous Breathing  Post-op Pain: none  Post-op Assessment: Post-op Vital signs reviewed  Post-op Vital Signs: Reviewed  Last Vitals:  Filed Vitals:   05/09/14 1713  BP: 129/60  Pulse: 69  Temp: 36.8 C  Resp: 18    Complications: No apparent anesthesia complications

## 2014-05-09 NOTE — Op Note (Signed)
05/08/2014 - 05/09/2014  3:09 PM  PATIENT:  Kendra Ball  69 y.o. female  Patient Care Team: Irene Pap, NP as PCP - General (Nurse Practitioner) Juanita Craver, MD as Consulting Physician (Gastroenterology) Excell Seltzer, MD as Consulting Physician (General Surgery)  PRE-OPERATIVE DIAGNOSIS:  Transverse colon intussusception with obstruction  POST-OPERATIVE DIAGNOSIS:    Transverse colon intussusception with obstruction  PROCEDURE:    LAPAROSCOPIC LYSIS OF ADHESIONS LAPAROSCOPIC [place]. LAPAROSCOPIC ASSISTED EXTENDED RIGHT COLECTOMY  SURGEON:  Surgeon(s): [name], MD  ASSISTANT: RN   ANESTHESIA:   local and general  EBL:  Total I/O In: 4627 [I.V.:3050] Out: 950 [Urine:900; Blood:50]  Delay start of Pharmacological VTE agent (>24hrs) due to surgical blood loss or risk of bleeding:  no  DRAINS: none   SPECIMEN:  Source of Specimen:  PROXIMAL COLON   DISPOSITION OF SPECIMEN:  PATHOLOGY  COUNTS:  YES  PLAN OF CARE: Admit to inpatient   PATIENT DISPOSITION:  PACU - hemodynamically stable.  INDICATION:    Morbidly obese female status post lap Gastric bypass 2003.  Family history of colon cancer.  Colonoscopy's note lipoma of transverse colon.  Usually not bothersome.  However patient has had worsening crampy abdominal pain and CAT scan showed intussusception of probable lipoma and mid transverse colon.  Because of her severe cramping pain and inability to tolerate liquids,  I recommended segmental resection:  The anatomy & physiology of the digestive tract was discussed.  The pathophysiology was discussed.  Natural history risks without surgery was discussed.   I worked to give an overview of the disease and the frequent need to have multispecialty involvement.  I feel the risks of no intervention will lead to serious problems that outweigh the operative risks; therefore, I recommended a partial colectomy to remove the pathology.   Laparoscopic & open techniques were discussed.   Risks such as bleeding, infection, abscess, leak, reoperation, possible ostomy, hernia, heart attack, death, and other risks were discussed.  I noted a good likelihood this will help address the problem.   Goals of post-operative recovery were discussed as well.  We will work to minimize complications.  An educational handout on the pathology was given as well.  Questions were answered.    The patient expresses understanding & wishes to proceed with surgery.  OR FINDINGS:   Patient had firm racquetball-sized mass and proximal transverse colon.  Just proximal to the retrocolic gastric bypass.  No perforation or abscess.  No obvious metastatic disease on visceral parietal peritoneum or liver.  It is an ileocolonic anastomosis that rests in the midabdomen just proximal to the retrocolic gastrojejunostomy and ligament of Treitz.  It is a isoperistaltic in-line side-to-side anastomosis.  DESCRIPTION:   Informed consent was confirmed.  The patient underwent general anaesthesia without difficulty.  The patient was positioned with arms tucked & secured appropriately.  VTE prevention in place.  The patient's abdomen was clipped, prepped, & draped in a sterile fashion.  Surgical timeout confirmed our plan.  The patient was positioned in reverse Trendelenburg.  Abdominal entry was gained using optical entry technique in the right upper abdomen.  Entry was clean.  I induced carbon dioxide insufflation.  Camera inspection revealed no injury.  Extra ports were carefully placed under direct laparoscopic visualization.  I freed some adhesions of greater omentum off the anterior bowel wall.  I focused on the transverse colon.  I could see an inflamed area in the proximal/mid transverse colon that was thickened.  I could see the jejunojejunostomy from the prior gastric bypass.  When that more proximally to confirm that the gastrojejunal limb was retrocolic as noted  in the operative report.  It was just medial to the ligament of Treitz.  The colonic mass was proximal to this.  I carefully freed greater omentum off the transverse colon to get towards the lesser sac.  This took some time.  I was able to get distally more towards the splenic flexure.  I mobilized the proximal descending colon and splenic flexure off the retroperitoneum and in a lateral to medial fashion.  Freed the greater omentum off it as well to completely mobilize the splenic flexure towards the midline.  I then switched over to looking on the right side.  Patient had a very short ascending colon.  I mobilized the hepatic flexure in a superior to inferior fashion.  I freed adhesions of greater omentum and mesocolon off the liver edge and gallbladder.  I freed the proximal colon off the retroperitoneum.  I freed it off the duodenal sweep.  I freed off the right kidney.  Came down and mobilized proximal colon in a lateral to medial fashionl.  With that both hepatic and splenic flexures were mobilized toward the midline.  I placed a wound protector through a supraumbilical midline 8cm incision in the supraumbilical region.  With that, I was able to eviserate the distal ileum and proximal colon.  I could isolate the pathology.  Large proximal transverse colon mass.  It was quite firm and hard.  No perforation.  Right above the right middle colic pedicle.  The left middle colic pedicle hole looked healthy.  That was just medial to the retro-colic gastrojejunostomy.  I therefore decided to transect at the mid transverse colon and keeping a healthy left middle colic pedicle.  Her tissues are quite friable.  And up using clamps on the right middle colic vessels with 2-0 silk ties.  I mobilized the terminal ileum up.  I ran the small bowel from the terminal ileum more proximally and saw no injury or other abnormalities.  A confirm anatomy.  Allowed most that the fall in.  [place] eyes and transected at the  terminal ileum.  I took the intervening proximal colon mesentery using bipolar Enseal system and clamps and ties on the right middle colic vessels to good result.  Specimen removed.   I kept a healthy pedicle on the ileal and left middle colic side.   I did a side-to-side stapled anastomosis of ileum to mid-transverse colon using a 75mm GIA stapler.  Decide delayed isoperistaltic.  I closed the common defect using a 20V lock suture running from both corners.  Also late some omentum over the area.  We did reinspection of the abdomen.  The colon and anastomosis looked healthy.  The small bowel looked healthy.  No evidence of injury.  Hemostasis was good.  Ureters, retroperitoneum, and bowel uninjured.  The anastomosis looked healthy.   We did a final irrigation of antibiotic solution (900 mg clindamycin/240 mg gentamicin in a liter of crystalloid) & held that for 10 minutes while we removed the wound protector of the Gelport & changed gown & gloves. The patient was re-draped.  Sterile unused instruments were used from this point out per colon SSI prevention protocol.       We reinspected the abdomen.  Hemostasis was good.   No injury.  The anastomosis looked healthy.  We changed gown and gloves.  I placed On-Q  catheter and sheaths into the preperitoneal space under direct palpation.  I closed the 55mm port sites using Monocryl stitch and sterile dressing.  I closed the midline incision using #1 PDS running closure. I closed the skin with some interrupted Monocryl stitches. I placed betadine-soaked wicks in between those areas. I placed sterile dressing.  OnQ catheters placed & sheaths peeled away.  Patient is being extubated go to recovery room. I discussed postop care with the patient in detail the office & in the holding area. Instructions are written. I updated the patient's status to the family.  Recommendations were made.  Questions were answered.  The family expressed understanding & appreciation.  Adin Hector, M.D., F.A.C.S. Gastrointestinal and Minimally Invasive Surgery [facility] [address], [place], [place] [address] [phone] Main / Paging

## 2014-05-09 NOTE — Anesthesia Preprocedure Evaluation (Addendum)
Anesthesia Evaluation  Patient identified by MRN, date of birth, ID band Patient awake    Reviewed: Allergy & Precautions, H&P , Patient's Chart, lab work & pertinent test results, reviewed documented beta blocker date and time   Airway Mallampati: II  TM Distance: >3 FB Neck ROM: full    Dental no notable dental hx.    Pulmonary Current Smoker,  breath sounds clear to auscultation  Pulmonary exam normal       Cardiovascular hypertension, Rhythm:regular Rate:Normal     Neuro/Psych    GI/Hepatic   Endo/Other    Renal/GU      Musculoskeletal   Abdominal   Peds  Hematology   Anesthesia Other Findings   Reproductive/Obstetrics                           Anesthesia Physical Anesthesia Plan  ASA: II  Anesthesia Plan: General   Post-op Pain Management:    Induction: Intravenous  Airway Management Planned: Oral ETT  Additional Equipment:   Intra-op Plan:   Post-operative Plan: Extubation in OR  Informed Consent: I have reviewed the patients History and Physical, chart, labs and discussed the procedure including the risks, benefits and alternatives for the proposed anesthesia with the patient or authorized representative who has indicated his/her understanding and acceptance.   Dental Advisory Given and Dental advisory given  Plan Discussed with: CRNA and Surgeon  Anesthesia Plan Comments: (  Discussed general anesthesia, including possible nausea, instrumentation of airway, sore throat,pulmonary aspiration, etc. I asked if the were any outstanding questions, or  concerns before we proceeded. )        Anesthesia Quick Evaluation

## 2014-05-10 LAB — CBC
HCT: 35.7 % — ABNORMAL LOW (ref 36.0–46.0)
Hemoglobin: 11.2 g/dL — ABNORMAL LOW (ref 12.0–15.0)
MCH: 31.3 pg (ref 26.0–34.0)
MCHC: 31.4 g/dL (ref 30.0–36.0)
MCV: 99.7 fL (ref 78.0–100.0)
Platelets: 442 10*3/uL — ABNORMAL HIGH (ref 150–400)
RBC: 3.58 MIL/uL — ABNORMAL LOW (ref 3.87–5.11)
RDW: 13.7 % (ref 11.5–15.5)
WBC: 14.1 10*3/uL — ABNORMAL HIGH (ref 4.0–10.5)

## 2014-05-10 LAB — BASIC METABOLIC PANEL
Anion gap: 9 (ref 5–15)
BUN: 5 mg/dL — ABNORMAL LOW (ref 6–23)
CO2: 29 mmol/L (ref 19–32)
Calcium: 8.5 mg/dL (ref 8.4–10.5)
Chloride: 98 mEq/L (ref 96–112)
Creatinine, Ser: 0.75 mg/dL (ref 0.50–1.10)
GFR calc Af Amer: >90 mL/min (ref >90)
GFR calc non Af Amer: 86 mL/min — ABNORMAL LOW (ref >90)
Glucose, Bld: 135 mg/dL — ABNORMAL HIGH (ref 70–99)
Potassium: 4.6 mmol/L (ref 3.5–5.1)
Sodium: 136 mmol/L (ref 135–145)

## 2014-05-10 LAB — MAGNESIUM: Magnesium: 1.9 mg/dL (ref 1.5–2.5)

## 2014-05-10 LAB — HEMOGLOBIN A1C
Hgb A1c MFr Bld: 5.5 % (ref <5.7)
Mean Plasma Glucose: 111 mg/dL (ref <117)

## 2014-05-10 NOTE — Progress Notes (Signed)
Patient ID: Kendra Ball, female   DOB: 05-03-1945, 69 y.o.   MRN: [id number] [facility] Surgery Progress Note:   1 Day Post-Op  Subjective: Mental status is clear.   Objective: Vital signs in last 24 hours: Temp:  [97.6 F (36.4 C)-98.3 F (36.8 C)] 98.2 F (36.8 C) (01/16 0540) Pulse Rate:  [56-69] 56 (01/16 0848) Resp:  [13-18] 18 (01/16 0540) BP: (90-137)/(30-95) 110/50 mmHg (01/16 0848) SpO2:  [92 %-100 %] 92 % (01/16 0540) Weight:  [199 lb 6.4 oz (90.447 kg)] 199 lb 6.4 oz (90.447 kg) (01/16 0620)  Intake/Output from previous day: 01/15 0701 - 01/16 0700 In: 4469.2 [P.O.:600; I.V.:3869.2] Out: 2950 [Urine:2900; Blood:50] Intake/Output this shift:    Physical Exam: Work of breathing is normal.  Minimal abdominal pain.    Lab Results:  Results for orders placed or performed during the hospital encounter of 05/08/14 (from the past 48 hour(s))  Comprehensive metabolic panel     Status: Abnormal   Collection Time: 05/08/14  7:57 PM  Result Value Ref Range   Sodium 141 135 - 145 mmol/L    Comment: Please note change in reference range.   Potassium 3.7 3.5 - 5.1 mmol/L    Comment: Please note change in reference range.   Chloride 103 96 - 112 mEq/L   CO2 29 19 - 32 mmol/L   Glucose, Bld 83 70 - 99 mg/dL   BUN 5 (L) 6 - 23 mg/dL   Creatinine, Ser 0.65 0.50 - 1.10 mg/dL   Calcium 8.8 8.4 - 10.5 mg/dL   Total Protein 6.6 6.0 - 8.3 g/dL   Albumin 3.4 (L) 3.5 - 5.2 g/dL   AST 17 0 - 37 U/L   ALT 12 0 - 35 U/L   Alkaline Phosphatase 54 39 - 117 U/L   Total Bilirubin 0.5 0.3 - 1.2 mg/dL   GFR calc non Af Amer 90 (L) >90 mL/min   GFR calc Af Amer >90 >90 mL/min    Comment: (NOTE) The eGFR has been calculated using the CKD EPI equation. This calculation has not been validated in all clinical situations. eGFR's persistently <90 mL/min signify possible Chronic Kidney Disease.    Anion gap 9 5 - 15  CBC with Differential     Status: Abnormal   Collection Time:  05/08/14  7:57 PM  Result Value Ref Range   WBC 9.7 4.0 - 10.5 K/uL   RBC 4.07 3.87 - 5.11 MIL/uL   Hemoglobin 12.8 12.0 - 15.0 g/dL   HCT 39.9 36.0 - 46.0 %   MCV 98.0 78.0 - 100.0 fL   MCH 31.4 26.0 - 34.0 pg   MCHC 32.1 30.0 - 36.0 g/dL   RDW 13.6 11.5 - 15.5 %   Platelets 466 (H) 150 - 400 K/uL   Neutrophils Relative % 66 43 - 77 %   Neutro Abs 6.3 1.7 - 7.7 K/uL   Lymphocytes Relative 27 12 - 46 %   Lymphs Abs 2.6 0.7 - 4.0 K/uL   Monocytes Relative 6 3 - 12 %   Monocytes Absolute 0.6 0.1 - 1.0 K/uL   Eosinophils Relative 1 0 - 5 %   Eosinophils Absolute 0.1 0.0 - 0.7 K/uL   Basophils Relative 0 0 - 1 %   Basophils Absolute 0.0 0.0 - 0.1 K/uL  Urinalysis, Routine w reflex microscopic     Status: Abnormal   Collection Time: 05/08/14  8:23 PM  Result Value Ref Range   Color, Urine  YELLOW YELLOW   APPearance CLOUDY (A) CLEAR   Specific Gravity, Urine 1.012 1.005 - 1.030   pH 7.0 5.0 - 8.0   Glucose, UA NEGATIVE NEGATIVE mg/dL   Hgb urine dipstick TRACE (A) NEGATIVE   Bilirubin Urine NEGATIVE NEGATIVE   Ketones, ur NEGATIVE NEGATIVE mg/dL   Protein, ur NEGATIVE NEGATIVE mg/dL   Urobilinogen, UA 0.2 0.0 - 1.0 mg/dL   Nitrite NEGATIVE NEGATIVE   Leukocytes, UA NEGATIVE NEGATIVE  Urine microscopic-add on     Status: None   Collection Time: 05/08/14  8:23 PM  Result Value Ref Range   Squamous Epithelial / LPF RARE RARE   RBC / HPF 0-2 <3 RBC/hpf  Surgical pcr screen     Status: None   Collection Time: 05/09/14  7:21 AM  Result Value Ref Range   MRSA, PCR NEGATIVE NEGATIVE   Staphylococcus aureus NEGATIVE NEGATIVE    Comment:        The Xpert SA Assay (FDA approved for NASAL specimens in patients over 37 years of age), is one component of a comprehensive surveillance program.  Test performance has been validated by [facility] for patients greater than or equal to 73 year old. It is not intended to diagnose infection nor to guide or monitor treatment.   Basic  metabolic panel     Status: Abnormal   Collection Time: 05/10/14  5:46 AM  Result Value Ref Range   Sodium 136 135 - 145 mmol/L    Comment: Please note change in reference range.   Potassium 4.6 3.5 - 5.1 mmol/L    Comment: Please note change in reference range. RESULT REPEATED AND VERIFIED DELTA CHECK NOTED    Chloride 98 96 - 112 mEq/L   CO2 29 19 - 32 mmol/L   Glucose, Bld 135 (H) 70 - 99 mg/dL   BUN 5 (L) 6 - 23 mg/dL   Creatinine, Ser 0.75 0.50 - 1.10 mg/dL   Calcium 8.5 8.4 - 10.5 mg/dL   GFR calc non Af Amer 86 (L) >90 mL/min   GFR calc Af Amer >90 >90 mL/min    Comment: (NOTE) The eGFR has been calculated using the CKD EPI equation. This calculation has not been validated in all clinical situations. eGFR's persistently <90 mL/min signify possible Chronic Kidney Disease.    Anion gap 9 5 - 15  CBC     Status: Abnormal   Collection Time: 05/10/14  5:46 AM  Result Value Ref Range   WBC 14.1 (H) 4.0 - 10.5 K/uL   RBC 3.58 (L) 3.87 - 5.11 MIL/uL   Hemoglobin 11.2 (L) 12.0 - 15.0 g/dL   HCT 35.7 (L) 36.0 - 46.0 %   MCV 99.7 78.0 - 100.0 fL   MCH 31.3 26.0 - 34.0 pg   MCHC 31.4 30.0 - 36.0 g/dL   RDW 13.7 11.5 - 15.5 %   Platelets 442 (H) 150 - 400 K/uL  Magnesium     Status: None   Collection Time: 05/10/14  5:46 AM  Result Value Ref Range   Magnesium 1.9 1.5 - 2.5 mg/dL    Radiology/Results: Ct Abdomen Pelvis W Contrast  05/08/2014   CLINICAL DATA:  Generalized abdominal pain, burning in pelvic area since Thanksgiving, constipation, early satiety, past history of gastric bypass surgery, Cesarean section, hypertension, colon polyps, smoking, family history colon cancer  EXAM: CT ABDOMEN AND PELVIS WITH CONTRAST  TECHNIQUE: Multidetector CT imaging of the abdomen and pelvis was performed using the standard protocol following  bolus administration of intravenous contrast. Sagittal and coronal MPR images reconstructed from axial data set.  CONTRAST:  1108m OMNIPAQUE IOHEXOL  300 MG/ML SOLN IV. Dilute oral contrast.  COMPARISON:  07/12/2010  FINDINGS: Lung bases clear.  Liver, spleen, pancreas, kidneys, and adrenal glands normal.  Scattered atherosclerotic calcifications without aneurysm.  9 cm length segment of colo-colic intussusception at the mid to distal transverse colon with a discrete fat attenuation mass as lead point measuring 3.9 x 3.2 x 3.0 cm compatible with transverse colon lipoma, noted on previous exam.  Associated wall thickening of the transverse colon with minimal pericolic inflammation.  Proximal transverse colon and ascending colon appear distended without evidence of small bowel dilatation/obstruction.  Contrast is present within the descending colon and proximal sigmoid colon.  Additional lipoma versus stool artifact at the mid descending colon 19 x 17 x 25 mm.  Prior gastric bypass surgery.  Small bowel loops unremarkable.  No additional mass, adenopathy, free fluid, or free air.  Normal appendix.  Unremarkable bladder, ureters, uterus, and ovaries with prominent venous structures in LEFT adnexa, extending to the LEFT ovarian vein, nonspecific but can be seen with pelvic congestion.  Osseous demineralization.  IMPRESSION: 9 cm length segment of colo-colic intussusception involving the mid to distal transverse colon with a 3.9 x 3.2 x 3.0 cm diameter transverse colonic lipoma as lead point.  Associated bowel wall thickening with dilatation of colon proximal to the intussusception without evidence of small bowel obstruction.  Question additional lipoma at the descending colon 19 x 17 x 25 mm.  Prior gastric bypass surgery.  Findings called to LNicky PughNP on 05/08/2014 at 1458 hr.   Electronically Signed   By: MLavonia DanaM.D.   On: 05/08/2014 15:00    Anti-infectives: Anti-infectives    Start     Dose/Rate Route Frequency Ordered Stop   05/10/14 0000  cefoTEtan (CEFOTAN) 2 g in dextrose 5 % 50 mL IVPB     2 g100 mL/hr over 30 Minutes Intravenous Every 12  hours 05/09/14 1633 05/10/14 0112   05/09/14 1345  clindamycin (CLEOCIN) 900 mg, gentamicin (GARAMYCIN) 240 mg in sodium chloride 0.9 % 1,000 mL for intraperitoneal lavage      Intraperitoneal To Surgery 05/09/14 1338 05/09/14 1406   05/09/14 1000  [MAR Hold]  metroNIDAZOLE (FLAGYL) tablet 1,000 mg     (MAR Hold since 05/09/14 0955)   1,000 mg Oral On call 05/09/14 0846 05/10/14 1000   05/09/14 1000  [MAR Hold]  neomycin (MYCIFRADIN) tablet 1,000 mg     (MAR Hold since 05/09/14 0955)   1,000 mg Oral On call 05/09/14 0846 05/09/14 1018   05/09/14 0930  metroNIDAZOLE (FLAGYL) tablet 500 mg  Status:  Discontinued    Comments:  Take 2 pills (=10063m STAT & on call to OR   500 mg Oral As directed 05/09/14 0828 05/09/14 0846   05/09/14 0900  neomycin (MYCIFRADIN) tablet 500 mg  Status:  Discontinued    Comments:  Take 2 pills (=100029mSTAT & on call to OR   500 mg Oral STAT 05/09/14 0828 05/09/14 0846   05/09/14 0900  metroNIDAZOLE (FLAGYL) tablet 1,000 mg    Comments:  Take 2 pills (=1000m59mTAT & on call to OR   1,000 mg Oral STAT 05/09/14 0846 05/09/14 0912   05/09/14 0900  neomycin (MYCIFRADIN) tablet 1,000 mg     1,000 mg Oral STAT 05/09/14 0846 05/09/14 0912   05/09/14 0830  cefoTEtan (CEFOTAN) 2 g in  dextrose 5 % 50 mL IVPB     2 g100 mL/hr over 30 Minutes Intravenous On call to O.R. 05/09/14 0828 05/09/14 1130      Assessment/Plan: Problem List: Patient Active Problem List   Diagnosis Date Noted  . 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 Intussusception of colon 05/08/2014  . Colonic intussusception 05/08/2014  . Hypokalemia 05/07/2014  . Generalized abdominal pain 05/07/2014  . Elevated white blood cell count 05/07/2014  . Early satiety 05/07/2014  . Hematuria, microscopic 05/07/2014  . GAD (generalized anxiety disorder) 01/30/2014  . Major depressive disorder, recurrent episode, moderate 01/30/2014  . Cigarette nicotine  dependence without complication 66/09/43  . Dyslipidemia 10/03/2013  . Abnormal mammogram 10/01/2013  . H/O gastric bypass 08/13/2013  . Smoker 08/13/2013  . BMI 34.0-34.9,adult 08/13/2013  . Preventative health care 08/13/2013  . Skin lesion of face 08/13/2013  . Family history of premature coronary heart disease 08/13/2013  . Left hip pain 08/13/2013  . Hypothyroidism 08/26/2008  . DEPRESSION 08/26/2008  . HYPERTENSION 08/26/2008  . Osteopenia 08/26/2008    Advised to stay on clears for now until passing gas.  Concerned about her low BP but labs ok and patient is asymptomatic.  She was Dr. Lear Ng first bypass.   1 Day Post-Op    LOS: 2 days   Matt B. Hassell Done, MD, [facility] [phone] beeper [phone]  05/10/2014 9:42 AM

## 2014-05-10 NOTE — ED Provider Notes (Signed)
CSN: [id number]     Arrival date & time 05/08/14  1714 History   First MD Initiated Contact with Patient 05/08/14 1854     Chief Complaint  Patient presents with  . Bowel Intussiception      (Consider location/radiation/quality/duration/timing/severity/associated sxs/prior Treatment) HPI Patient presents to the emergency department from her primary care doctor who obtained a CT scan that showed she has an intussusception of about 9 cm of her colon.  Patient states that the symptoms of an ongoing for several weeks with loose stools.  Patient states that palpation makes the pain worse.  Patient states that she has not had any chest pain, shortness of breath, weakness, dizziness, headache, blurred vision, vomiting, fever or syncope.  The patient states that she did not take any medications for the pain Past Medical History  Diagnosis Date  . DEPRESSION 08/26/2008  . HYPERTENSION 08/26/2008  . HYPOTHYROIDISM 08/26/2008  . OSTEOPENIA 08/26/2008  . TOBACCO USER 12/25/2009  . Obesity   . Hx of colonic polyps     First noted on  colonoscopy 2012  . Hypercholesteremia    Past Surgical History  Procedure Laterality Date  . Gastric bypass    . Cesarean section    . Dilation and curettage of uterus      x2  . Forearm fracture surgery      right  . Tonsillectomy     Family History  Problem Relation Age of Onset  . Cancer Mother     colon  . Dementia Mother   . Depression Mother   . Heart disease Father 60  . Heart disease Sister 80    stents, pacemaker  . Depression Sister   . Heart disease Brother 109    CABG, MI  . Obesity Daughter   . Suicidality Daughter   . Bipolar disorder Daughter   . Obesity Son   . Mental illness Daughter     bipolar  . Obesity Daughter   . Dementia Paternal Aunt    History  Substance Use Topics  . Smoking status: Current Every Day Smoker -- 1.00 packs/day for 50 years    Types: Cigarettes  . Smokeless tobacco: Never Used  . Alcohol Use: 0.6 oz/week    1  Glasses of wine per week     Comment: 5-6x/month will have one glass of wine   OB History    No data available     Review of Systems  All other systems negative except as documented in the HPI. All pertinent positives and negatives as reviewed in the HPI.  Allergies  Morphine sulfate  Home Medications   Prior to Admission medications   Medication Sig Start Date End Date Taking? Authorizing Provider  aspirin 81 MG tablet Take 81 mg by mouth daily.   Yes Historical Provider, MD  buPROPion (WELLBUTRIN SR) 150 MG 12 hr tablet Take 1 tablet (150 mg total) by mouth 2 (two) times daily. 04/01/14  Yes [name], MD  cholecalciferol (VITAMIN D) 1000 UNITS tablet Take 1,000 Units by mouth daily.   Yes Historical Provider, MD  Cyanocobalamin (VITAMIN B-12 PO) Take 1 tablet by mouth daily.   Yes Historical Provider, MD  diphenhydramine-acetaminophen (TYLENOL PM EXTRA STRENGTH) 25-500 MG TABS Take 2 tablets by mouth at bedtime as needed (for pain and sleep).    Yes Historical Provider, MD  escitalopram (LEXAPRO) 20 MG tablet Take 1 tablet (20 mg total) by mouth daily. 04/01/14  Yes [name], MD  ferrous fumarate (HEMOCYTE -  106 MG FE) 325 (106 FE) MG TABS Take 1 tablet by mouth.   Yes Historical Provider, MD  levothyroxine (SYNTHROID, LEVOTHROID) 100 MCG tablet Take 1T po 1 hr before coffee & breakfast 5d/wk. Skip Wed. & Sun. Patient taking differently: Take 100 mcg by mouth daily before breakfast.  04/08/14  Yes Irene Pap, NP  lisinopril (PRINIVIL,ZESTRIL) 20 MG tablet Take 1 tablet (20 mg total) by mouth daily. 04/07/14  Yes Irene Pap, NP  LORazepam (ATIVAN) 1 MG tablet Take 1 tablet (1 mg total) by mouth 2 (two) times daily as needed for anxiety. 04/01/14  Yes [name], MD  Magnesium 250 MG TABS Take 250 mg by mouth 2 (two) times daily.    Yes Historical Provider, MD  Melatonin 3 MG TABS Take 3 mg by mouth at bedtime as needed (for sleep).    Yes Historical Provider, MD   simvastatin (ZOCOR) 10 MG tablet Take 1 tablet (10 mg total) by mouth at bedtime. 02/24/14  Yes Irene Pap, NP  diclofenac sodium (VOLTAREN) 1 % GEL Apply 50 cent piece-sized amount to hip TID PRN. Patient not taking: Reported on 05/06/2014 10/01/13   Irene Pap, NP  oxyCODONE (OXY IR/ROXICODONE) 5 MG immediate release tablet Take 1-2 tablets (5-10 mg total) by mouth every 4 (four) hours as needed for moderate pain, severe pain or breakthrough pain. 05/09/14   [name], MD   BP 116/51 mmHg  Pulse 64  Temp(Src) 98.2 F (36.8 C) (Oral)  Resp 16  Ht 5' 3.5" (1.613 m)  Wt 199 lb 6.4 oz (90.447 kg)  BMI 34.76 kg/m2  SpO2 91% Physical Exam  Constitutional: She is oriented to person, place, and time. She appears well-developed and well-nourished. No distress.  HENT:  Head: Normocephalic and atraumatic.  Mouth/Throat: Oropharynx is clear and moist.  Eyes: Pupils are equal, round, and reactive to light.  Neck: Normal range of motion. Neck supple.  Cardiovascular: Normal rate, regular rhythm and normal heart sounds.  Exam reveals no gallop and no friction rub.   No murmur heard. Pulmonary/Chest: Effort normal and breath sounds normal. No respiratory distress.  Abdominal: Soft. Bowel sounds are normal. She exhibits no distension. There is tenderness. There is no rebound.  Neurological: She is alert and oriented to person, place, and time. She exhibits normal muscle tone. Coordination normal.  Skin: Skin is warm and dry. No rash noted. No erythema.  Nursing note and vitals reviewed.   ED Course  Procedures (including critical care time) Labs Review Labs Reviewed  COMPREHENSIVE METABOLIC PANEL - Abnormal; Notable for the following:    BUN 5 (*)    Albumin 3.4 (*)    GFR calc non Af Amer 90 (*)    All other components within normal limits  CBC WITH DIFFERENTIAL - Abnormal; Notable for the following:    Platelets 466 (*)    All other components within normal limits  URINALYSIS,  ROUTINE W REFLEX MICROSCOPIC - Abnormal; Notable for the following:    APPearance CLOUDY (*)    Hgb urine dipstick TRACE (*)    All other components within normal limits  BASIC METABOLIC PANEL - Abnormal; Notable for the following:    Glucose, Bld 135 (*)    BUN 5 (*)    GFR calc non Af Amer 86 (*)    All other components within normal limits  CBC - Abnormal; Notable for the following:    WBC 14.1 (*)    RBC 3.58 (*)  Hemoglobin 11.2 (*)    HCT 35.7 (*)    Platelets 442 (*)    All other components within normal limits  SURGICAL PCR SCREEN  URINE MICROSCOPIC-ADD ON  MAGNESIUM  HEMOGLOBIN A1C  SURGICAL PATHOLOGY    I spoke with general surgery, who will come down to admit the patient to the hospital  MDM   Final diagnoses:  Intussusception of colon        Brent General, PA-C 05/10/14 [place], MD 05/11/14 [phone]

## 2014-05-11 NOTE — Progress Notes (Signed)
Patient ID: Kendra Ball, female   DOB: 02-03-1946, 69 y.o.   MRN: [id number] [facility] Surgery Progress Note:   2 Days Post-Op  Subjective: Mental status is clear.  Not doing as well today as yesterday.  Not ready to advance Objective: Vital signs in last 24 hours: Temp:  [97.8 F (36.6 C)-98.2 F (36.8 C)] 97.8 F (36.6 C) (01/17 0620) Pulse Rate:  [58-79] 79 (01/17 0915) Resp:  [16] 16 (01/17 0620) BP: (103-126)/(34-53) 121/53 mmHg (01/17 0915) SpO2:  [90 %-95 %] 90 % (01/17 0620) Weight:  [198 lb 3.2 oz (89.903 kg)] 198 lb 3.2 oz (89.903 kg) (01/17 0609)  Intake/Output from previous day: 01/16 0701 - 01/17 0700 In: 3311.8 [P.O.:1540; I.V.:1771.8] Out: 3325 [Urine:3325] Intake/Output this shift: Total I/O In: 240 [P.O.:240] Out: 200 [Urine:200]  Physical Exam: Work of breathing is normal.  Incisions with pain pump in place  Lab Results:  Results for orders placed or performed during the hospital encounter of 05/08/14 (from the past 48 hour(s))  Hemoglobin A1c     Status: None   Collection Time: 05/10/14  5:46 AM  Result Value Ref Range   Hgb A1c MFr Bld 5.5 <5.7 %    Comment: (NOTE)                                                                       According to the ADA Clinical Practice Recommendations for 2011, when HbA1c is used as a screening test:  >=6.5%   Diagnostic of Diabetes Mellitus           (if abnormal result is confirmed) 5.7-6.4%   Increased risk of developing Diabetes Mellitus References:Diagnosis and Classification of Diabetes Mellitus,Diabetes GEXB,2841,32(GMWNU 1):S62-S69 and Standards of Medical Care in         Diabetes - 2011,Diabetes UVOZ,3664,40 (Suppl 1):S11-S61.    Mean Plasma Glucose 111 <117 mg/dL    Comment: Performed at [address] metabolic panel     Status: Abnormal   Collection Time: 05/10/14  5:46 AM  Result Value Ref Range   Sodium 136 135 - 145 mmol/L    Comment: Please note change in reference range.    Potassium 4.6 3.5 - 5.1 mmol/L    Comment: Please note change in reference range. RESULT REPEATED AND VERIFIED DELTA CHECK NOTED    Chloride 98 96 - 112 mEq/L   CO2 29 19 - 32 mmol/L   Glucose, Bld 135 (H) 70 - 99 mg/dL   BUN 5 (L) 6 - 23 mg/dL   Creatinine, Ser 0.75 0.50 - 1.10 mg/dL   Calcium 8.5 8.4 - 10.5 mg/dL   GFR calc non Af Amer 86 (L) >90 mL/min   GFR calc Af Amer >90 >90 mL/min    Comment: (NOTE) The eGFR has been calculated using the CKD EPI equation. This calculation has not been validated in all clinical situations. eGFR's persistently <90 mL/min signify possible Chronic Kidney Disease.    Anion gap 9 5 - 15  CBC     Status: Abnormal   Collection Time: 05/10/14  5:46 AM  Result Value Ref Range   WBC 14.1 (H) 4.0 - 10.5 K/uL   RBC 3.58 (L) 3.87 - 5.11 MIL/uL  Hemoglobin 11.2 (L) 12.0 - 15.0 g/dL   HCT 35.7 (L) 36.0 - 46.0 %   MCV 99.7 78.0 - 100.0 fL   MCH 31.3 26.0 - 34.0 pg   MCHC 31.4 30.0 - 36.0 g/dL   RDW 13.7 11.5 - 15.5 %   Platelets 442 (H) 150 - 400 K/uL  Magnesium     Status: None   Collection Time: 05/10/14  5:46 AM  Result Value Ref Range   Magnesium 1.9 1.5 - 2.5 mg/dL    Radiology/Results: No results found.  Anti-infectives: Anti-infectives    Start     Dose/Rate Route Frequency Ordered Stop   05/10/14 0000  cefoTEtan (CEFOTAN) 2 g in dextrose 5 % 50 mL IVPB     2 g100 mL/hr over 30 Minutes Intravenous Every 12 hours 05/09/14 1633 05/10/14 0112   05/09/14 1345  clindamycin (CLEOCIN) 900 mg, gentamicin (GARAMYCIN) 240 mg in sodium chloride 0.9 % 1,000 mL for intraperitoneal lavage      Intraperitoneal To Surgery 05/09/14 1338 05/09/14 1406   05/09/14 1000  [MAR Hold]  metroNIDAZOLE (FLAGYL) tablet 1,000 mg     (MAR Hold since 05/09/14 0955)   1,000 mg Oral On call 05/09/14 0846 05/10/14 1000   05/09/14 1000  [MAR Hold]  neomycin (MYCIFRADIN) tablet 1,000 mg     (MAR Hold since 05/09/14 0955)   1,000 mg Oral On call 05/09/14 0846  05/09/14 1018   05/09/14 0930  metroNIDAZOLE (FLAGYL) tablet 500 mg  Status:  Discontinued    Comments:  Take 2 pills (=1046m) STAT & on call to OR   500 mg Oral As directed 05/09/14 0828 05/09/14 0846   05/09/14 0900  neomycin (MYCIFRADIN) tablet 500 mg  Status:  Discontinued    Comments:  Take 2 pills (=10010m STAT & on call to OR   500 mg Oral STAT 05/09/14 0828 05/09/14 0846   05/09/14 0900  metroNIDAZOLE (FLAGYL) tablet 1,000 mg    Comments:  Take 2 pills (=100059mSTAT & on call to OR   1,000 mg Oral STAT 05/09/14 0846 05/09/14 0912   05/09/14 0900  neomycin (MYCIFRADIN) tablet 1,000 mg     1,000 mg Oral STAT 05/09/14 0846 05/09/14 0912   05/09/14 0830  cefoTEtan (CEFOTAN) 2 g in dextrose 5 % 50 mL IVPB     2 g100 mL/hr over 30 Minutes Intravenous On call to O.R. 05/09/14 0828 05/09/14 1130      Assessment/Plan: Problem List: Patient Active Problem List   Diagnosis Date Noted  . 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 Intussusception of colon 05/08/2014  . Colonic intussusception 05/08/2014  . Hypokalemia 05/07/2014  . Generalized abdominal pain 05/07/2014  . Elevated white blood cell count 05/07/2014  . Early satiety 05/07/2014  . Hematuria, microscopic 05/07/2014  . GAD (generalized anxiety disorder) 01/30/2014  . Major depressive disorder, recurrent episode, moderate 01/30/2014  . Cigarette nicotine dependence without complication 10/59/16/3846 Dyslipidemia 10/03/2013  . Abnormal mammogram 10/01/2013  . H/O gastric bypass 08/13/2013  . Smoker 08/13/2013  . BMI 34.0-34.9,adult 08/13/2013  . Preventative health care 08/13/2013  . Skin lesion of face 08/13/2013  . Family history of premature coronary heart disease 08/13/2013  . Left hip pain 08/13/2013  . Hypothyroidism 08/26/2008  . DEPRESSION 08/26/2008  . HYPERTENSION 08/26/2008  . Osteopenia 08/26/2008    Progressing well after colectomy in prior gastric  bypass. 2 Days Post-Op    LOS: 3 days  Matt B. Hassell Done, MD, [facility] [phone] beeper [phone]  05/11/2014 10:16 AM

## 2014-05-12 ENCOUNTER — Encounter (HOSPITAL_COMMUNITY): Payer: Self-pay | Admitting: Surgery

## 2014-05-12 MED ORDER — OXYCODONE-ACETAMINOPHEN 5-325 MG PO TABS: 1.0000 | ORAL_TABLET | ORAL | Status: DC | PRN

## 2014-05-12 NOTE — Progress Notes (Signed)
3 Days Post-Op  Subjective: She is feeling better, sites all look fine.  + BS, +BM, taking full liquids well and ready for more.  Objective: Vital signs in last 24 hours: Temp:  [98 F (36.7 C)-98.3 F (36.8 C)] 98.1 F (36.7 C) (01/18 0610) Pulse Rate:  [74-80] 74 (01/18 0610) Resp:  [16-18] 16 (01/18 0610) BP: (112-137)/(52-56) 137/54 mmHg (01/18 0610) SpO2:  [94 %-97 %] 97 % (01/18 0610) Weight:  [90.6 kg (199 lb 11.8 oz)] 90.6 kg (199 lb 11.8 oz) (01/18 0500) Last BM Date: 05/11/14 840 PO + BM x 1 Afebrile, VSS WBC up some, H/H is stable Full liquid diet Intake/Output from previous day: 01/17 0701 - 01/18 0700 In: 3540 [P.O.:840; I.V.:2700] Out: 700 [Urine:700] Intake/Output this shift:    General appearance: alert, cooperative and no distress Resp: clear to auscultation bilaterally GI: soft sore, incisions and port sites all look good.  + BS, sore but not having much pain.  Lab Results:   Recent Labs  05/10/14 0546  WBC 14.1*  HGB 11.2*  HCT 35.7*  PLT 442*    BMET  Recent Labs  05/10/14 0546  NA 136  K 4.6  CL 98  CO2 29  GLUCOSE 135*  BUN 5*  CREATININE 0.75  CALCIUM 8.5   PT/INR No results for input(s): LABPROT, INR in the last 72 hours.   Recent Labs Lab 05/08/14 1957  AST 17  ALT 12  ALKPHOS 54  BILITOT 0.5  PROT 6.6  ALBUMIN 3.4*     Lipase  No results found for: LIPASE   Studies/Results: No results found.  Medications: . acetaminophen  1,000 mg Oral TID  . aspirin  81 mg Oral Daily  . buPROPion  150 mg Oral BID  . cholecalciferol  1,000 Units Oral Daily  . enoxaparin (LOVENOX) injection  40 mg Subcutaneous Q24H  . escitalopram  20 mg Oral Daily  . levothyroxine  100 mcg Oral QAC breakfast  . lip balm  1 application Topical BID  . metoprolol  5 mg Intravenous Q6H  . saccharomyces boulardii  250 mg Oral BID  . simvastatin  10 mg Oral QHS  . sodium chloride  3 mL Intravenous Q12H   . sodium chloride 75 mL/hr at  05/12/14 0301  . bupivacaine 0.25 % ON-Q pump DUAL CATH 300 mL    . dextrose 5 % and 0.45 % NaCl with KCl 20 mEq/L 50 mL/hr at 05/09/14 1757   Prior to Admission medications   Medication Sig Start Date End Date Taking? Authorizing Provider  aspirin 81 MG tablet Take 81 mg by mouth daily.   Yes Historical Provider, MD  buPROPion (WELLBUTRIN SR) 150 MG 12 hr tablet Take 1 tablet (150 mg total) by mouth 2 (two) times daily. 04/01/14  Yes [name], MD  cholecalciferol (VITAMIN D) 1000 UNITS tablet Take 1,000 Units by mouth daily.   Yes Historical Provider, MD  Cyanocobalamin (VITAMIN B-12 PO) Take 1 tablet by mouth daily.   Yes Historical Provider, MD  diphenhydramine-acetaminophen (TYLENOL PM EXTRA STRENGTH) 25-500 MG TABS Take 2 tablets by mouth at bedtime as needed (for pain and sleep).    Yes Historical Provider, MD  escitalopram (LEXAPRO) 20 MG tablet Take 1 tablet (20 mg total) by mouth daily. 04/01/14  Yes [name], MD  ferrous fumarate (HEMOCYTE - 106 MG FE) 325 (106 FE) MG TABS Take 1 tablet by mouth.   Yes Historical Provider, MD  levothyroxine (SYNTHROID, LEVOTHROID) 100 MCG  tablet Take 1T po 1 hr before coffee & breakfast 5d/wk. Skip Wed. & Sun. Patient taking differently: Take 100 mcg by mouth daily before breakfast.  04/08/14  Yes Irene Pap, NP  lisinopril (PRINIVIL,ZESTRIL) 20 MG tablet Take 1 tablet (20 mg total) by mouth daily. 04/07/14  Yes Irene Pap, NP  LORazepam (ATIVAN) 1 MG tablet Take 1 tablet (1 mg total) by mouth 2 (two) times daily as needed for anxiety. 04/01/14  Yes [name], MD  Magnesium 250 MG TABS Take 250 mg by mouth 2 (two) times daily.    Yes Historical Provider, MD  Melatonin 3 MG TABS Take 3 mg by mouth at bedtime as needed (for sleep).    Yes Historical Provider, MD  simvastatin (ZOCOR) 10 MG tablet Take 1 tablet (10 mg total) by mouth at bedtime. 02/24/14  Yes Irene Pap, NP  diclofenac sodium (VOLTAREN) 1 % GEL Apply 50 cent  piece-sized amount to hip TID PRN. Patient not taking: Reported on 05/06/2014 10/01/13   Irene Pap, NP  oxyCODONE (OXY IR/ROXICODONE) 5 MG immediate release tablet Take 1-2 tablets (5-10 mg total) by mouth every 4 (four) hours as needed for moderate pain, severe pain or breakthrough pain. 05/09/14   [name], MD      Assessment/Plan Transverse colon intussusception with obstruction LAPAROSCOPIC LYSIS OF ADHESIONS, LAPAROSCOPIC SPLENIC FLEXURe  MOBILIZATION, LAPAROSCOPIC ASSISTED EXTENDED RIGHT COLECTOMY, 05/09/2014,  [name], MD. Hx of hypertension Hypothyroid Hx of depression Hx of tobacco use Dyslipidemia Body mass index is 34.8 Osteopenia Hx of gastric bypass, C section    Plan:  Advance diet, mobilize, saline lock IV, and if doing well home in AM. She is back on most of her home medicines.  I have not restarted lisinopril, Her BP just isn't high enough to warrant it.    LOS: 4 days    Janautica Netzley 05/12/2014

## 2014-05-13 MED ORDER — ACETAMINOPHEN 325 MG PO TABS: 650.0000 mg | ORAL_TABLET | Freq: Four times a day (QID) | ORAL | Status: DC | PRN

## 2014-05-13 MED ORDER — OXYCODONE HCL 5 MG PO TABS: 5.0000 mg | ORAL_TABLET | ORAL | Status: DC | PRN

## 2014-05-13 MED ORDER — SACCHAROMYCES BOULARDII 250 MG PO CAPS: ORAL_CAPSULE | ORAL | Status: DC

## 2014-05-13 NOTE — Care Management Note (Signed)
    Page 1 of 1   05/13/2014     12:16:41 PM CARE MANAGEMENT NOTE 05/13/2014  Patient:  Kendra Ball, Kendra Ball   Account Number:  [id number]  Date Initiated:  05/13/2014  Documentation initiated by:  Sunday Spillers  Subjective/Objective Assessment:   69 yo female admitted s/p colectomy. PTA lived at home with spouse.     Action/Plan:   home when stable   Anticipated DC Date:  05/13/2014   Anticipated DC Plan:  [place]  CM consult      Choice offered to / List presented to:             Status of service:  Completed, signed off Medicare Important Message given?  YES (If response is "NO", the following Medicare IM given date fields will be blank) Date Medicare IM given:  05/12/2014 Medicare IM given by:  [facility] Date Additional Medicare IM given:   Additional Medicare IM given by:    Discharge Disposition:  HOME/SELF CARE  Per UR Regulation:  Reviewed for med. necessity/level of care/duration of stay  If discussed at [place] of Stay Meetings, dates discussed:    Comments:

## 2014-05-13 NOTE — Progress Notes (Signed)
4 Days Post-Op  Subjective: She is doing well, tolerating diet and having Bm's.    Objective: Vital signs in last 24 hours: Temp:  [98 F (36.7 C)-98.7 F (37.1 C)] 98.4 F (36.9 C) (01/19 0545) Pulse Rate:  [60-66] 65 (01/19 0545) Resp:  [16] 16 (01/19 0545) BP: (110-139)/(40-50) 139/47 mmHg (01/19 0545) SpO2:  [94 %-97 %] 95 % (01/19 0545) Weight:  [88.5 kg (195 lb 1.7 oz)] 88.5 kg (195 lb 1.7 oz) (01/19 0500) Last BM Date: 05/11/14 2500 Po recorded Bm x 2 recorded. Afebrile, VSS No labs Intake/Output from previous day: 01/18 0701 - 01/19 0700 In: 2540 [P.O.:2540] Out: 0  Intake/Output this shift: Total I/O In: 243 [P.O.:240; I.V.:3] Out: -   General appearance: alert, cooperative and no distress Resp: clear to auscultation bilaterally GI: soft, sore, sites and wound looks fine.  Lab Results:  No results for input(s): WBC, HGB, HCT, PLT in the last 72 hours.  BMET No results for input(s): NA, K, CL, CO2, GLUCOSE, BUN, CREATININE, CALCIUM in the last 72 hours. PT/INR No results for input(s): LABPROT, INR in the last 72 hours.   Recent Labs Lab 05/08/14 1957  AST 17  ALT 12  ALKPHOS 54  BILITOT 0.5  PROT 6.6  ALBUMIN 3.4*     Lipase  No results found for: LIPASE   Studies/Results: No results found.  Medications: . aspirin  81 mg Oral Daily  . buPROPion  150 mg Oral BID  . cholecalciferol  1,000 Units Oral Daily  . enoxaparin (LOVENOX) injection  40 mg Subcutaneous Q24H  . escitalopram  20 mg Oral Daily  . levothyroxine  100 mcg Oral QAC breakfast  . lip balm  1 application Topical BID  . metoprolol  5 mg Intravenous Q6H  . saccharomyces boulardii  250 mg Oral BID  . simvastatin  10 mg Oral QHS  . 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 - NINE BENIGN LYMPH NODES (0/9). - PERICOLONIC  FIBROSIS AND INFLAMMATION. - SEE MICROSCOPIC DESCRIPTION.  Assessment/Plan Transverse colon intussusception with obstruction LAPAROSCOPIC LYSIS OF ADHESIONS, LAPAROSCOPIC SPLENIC FLEXURe MOBILIZATION, LAPAROSCOPIC ASSISTED EXTENDED RIGHT COLECTOMY, 05/09/2014, Kendra Boston, MD. Hx of hypertension Hypothyroid Hx of depression Hx of tobacco use Dyslipidemia Body mass index is 34.8 Osteopenia Hx of gastric bypass, C section   Plan:  Home today, I gave her her pathology report and told her there was no cancer.     LOS: 5 days    Kendra Ball 05/13/2014

## 2014-05-14 NOTE — Discharge Summary (Signed)
Physician Discharge Summary  Patient ID: Kendra Ball MRN: [id number] DOB/AGE: 10-08-1945 69 y.o.  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  Hospital Course: patient presents to ER on referral from nurse practitioner with newly diagnosed colo-colonic intussusception. Patient with symptoms for approx 1 month. Intermittent abdominal pain, poorly localized. Intermittent constipation relieved by laxatives. No nausea or vomiting, no fever or chills. 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 Previous history of gastric bypass procedure and C-section.  Pt admitted by Dr. Harlow Asa and taken to the OR later the following AM.  She tolerated the procedure well.  She had a short post op ileus.  Her diet was advanced with the return of her bowel function.  She was mobilized and ready for discharge on 05/13/13. 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 - NINE BENIGN LYMPH NODES (0/9). - PERICOLONIC FIBROSIS AND INFLAMMATION. - SEE MICROSCOPIC DESCRIPTION.  She was discharged home on 05/13/14 with follow up as noted below.    Condition on d/c:  Improved.      Disposition: 01-Home or Self Care  Discharge Instructions    Call MD for:  extreme fatigue    Complete by:  As directed      Call MD for:  hives    Complete by:  As directed      Call MD for:  persistant nausea and vomiting    Complete by:  As directed      Call MD for:  redness, tenderness, or signs of infection (pain, swelling, redness, odor or green/yellow discharge around incision site)    Complete by:  As directed      Call MD for:  severe uncontrolled pain    Complete by:  As directed      Call MD for:    Complete by:  As directed   Temperature > 101.28F     Diet - low sodium heart healthy    Complete by:  As directed      Discharge instructions    Complete by:  As directed   Please see discharge instruction sheets.  Also refer to handout given an office.  Please call our office if you have any questions or concerns [phone]     Discharge wound care:    Complete by:  As directed   If you have closed incisions, shower and bathe over these incisions with soap and water every day.  Remove all surgical dressings on postoperative day #3.  You do not need to replace dressings over the closed incisions unless you feel more comfortable with a Band-Aid covering it.   If you have an open wound that requires packing, please see wound care instructions.  In general, remove all dressings, wash wound with soap and water and then replace with saline moistened gauze.  Do the dressing change at least every day.  Please call our office [phone] if you have further questions.     Driving Restrictions    Complete by:  As directed   No driving until off narcotics and can safely swerve away without pain during an emergency     Increase activity slowly    Complete by:  As directed   Walk an hour a day.  Use 20-30 minute walks.  When you can walk 30 minutes without difficulty, increase to low impact/moderate activities such as biking, jogging, swimming, sexual activity..   Eventually can increase to unrestricted activity when not feeling pain.  If you feel pain: STOP![name]   Let pain protect you from overdoing it.  Use ice/heat/over-the-counter pain medications to help minimize his soreness.  Use pain prescriptions as needed to remain active.  It is better to take extra pain medications and be more active than to stay bedridden to avoid all pain medications.     Lifting restrictions    Complete by:  As directed   Avoid heavy lifting initially.  Do not push through pain.  You have no specific weight limit.  Coughing and sneezing or four more stressful to your incision than any lifting you will do. Pain will protect you from injury.  Therefore, avoid intense activity until off all narcotic pain medications.  Coughing and sneezing or four more stressful to your incision than any lifting he will do.     May shower / Bathe    Complete by:  As directed      May walk up steps    Complete by:  As directed      Sexual Activity Restrictions    Complete by:  As directed   Sexual activity as tolerated.  Do not push through pain.  Pain will protect you from injury.     Walk with assistance    Complete by:  As directed   Walk over an hour a day.  May use a walker/cane/companion to help with balance and stamina.            Medication List    STOP taking these medications        TYLENOL PM EXTRA STRENGTH 25-500 MG Tabs  Generic drug:  diphenhydramine-acetaminophen      TAKE these medications        acetaminophen 325 MG tablet  Commonly known as:  TYLENOL  Take 2 tablets (650 mg total) by mouth every 6 (six) hours as needed for mild pain (or Temp > 100).     aspirin 81 MG tablet  Take 81 mg by mouth daily.     buPROPion 150 MG 12 hr tablet  Commonly known as:  WELLBUTRIN SR  Take 1 tablet (150 mg total) by mouth 2 (two) times daily.     cholecalciferol 1000 UNITS tablet  Commonly known as:  VITAMIN D  Take 1,000 Units by mouth daily.     diclofenac sodium 1 %  Gel  Commonly known as:  VOLTAREN  Apply 50 cent piece-sized amount to hip TID PRN.     escitalopram 20 MG tablet  Commonly known as:  LEXAPRO  Take 1 tablet (20 mg total) by mouth daily.     ferrous fumarate 325 (106 FE) MG Tabs tablet  Commonly known as:  HEMOCYTE - 106 mg FE  Take 1 tablet by mouth.     levothyroxine 100 MCG tablet  Commonly known as:  SYNTHROID, LEVOTHROID  Take  1T po 1 hr before coffee & breakfast 5d/wk. Skip Wed. & Sun.     lisinopril 20 MG tablet  Commonly known as:  PRINIVIL,ZESTRIL  Take 1 tablet (20 mg total) by mouth daily.     LORazepam 1 MG tablet  Commonly known as:  ATIVAN  Take 1 tablet (1 mg total) by mouth 2 (two) times daily as needed for anxiety.     Magnesium 250 MG Tabs  Take 250 mg by mouth 2 (two) times daily.     Melatonin 3 MG Tabs  Take 3 mg by mouth at bedtime as needed (for sleep).     oxyCODONE 5 MG immediate release tablet  Commonly known as:  Oxy IR/ROXICODONE  Take 1-2 tablets (5-10 mg total) by mouth every 4 (four) hours as needed for moderate pain, severe pain or breakthrough pain.     saccharomyces boulardii 250 MG capsule  Commonly known as:  FLORASTOR  You can buy this over the counter and I would use it for the next couple weeks till your bowel movements are normal.     simvastatin 10 MG tablet  Commonly known as:  ZOCOR  Take 1 tablet (10 mg total) by mouth at bedtime.     VITAMIN B-12 PO  Take 1 tablet by mouth daily.           Follow-up Information    Follow up with GROSS,STEVEN C., MD In 2 weeks.   Specialty:  General Surgery   Why:  To follow up after your operation, To follow up after your hospital stay   Contact information:   [place] [address] [phone]       Follow up with WEAVER, Allen Kell, NP.   Specialty:  Nurse Practitioner   Why:  Call and let them know about your surgery and get a follow up appointment with them for management of your medical issues.    Contact information:   [address] [place] [address] [phone]       Signed: Earnstine Regal 05/14/2014, 1:39 PM

## 2014-05-15 ENCOUNTER — Ambulatory Visit (HOSPITAL_COMMUNITY): Payer: Self-pay | Admitting: Psychology

## 2014-05-15 NOTE — Addendum Note (Signed)
Addendum  created 05/15/14 1302 by Lyndle Herrlich, MD   Modules edited: Notes Section   Notes Section:  File: [id number]

## 2014-05-20 ENCOUNTER — Telehealth: Payer: Self-pay | Admitting: Nurse Practitioner

## 2014-05-20 ENCOUNTER — Ambulatory Visit (INDEPENDENT_AMBULATORY_CARE_PROVIDER_SITE_OTHER): Payer: Medicare Other | Admitting: Nurse Practitioner

## 2014-05-20 VITALS — HR 77 | Temp 97.8°F | Resp 16 | Ht 63.5 in | Wt 192.0 lb

## 2014-05-20 DIAGNOSIS — I10 Essential (primary) hypertension: Secondary | ICD-10-CM

## 2014-05-20 DIAGNOSIS — D72829 Elevated white blood cell count, unspecified: Secondary | ICD-10-CM

## 2014-05-20 DIAGNOSIS — Z09 Encounter for follow-up examination after completed treatment for conditions other than malignant neoplasm: Secondary | ICD-10-CM

## 2014-05-20 LAB — CBC
HCT: 39.4 % (ref 36.0–46.0)
Hemoglobin: 13.3 g/dL (ref 12.0–15.0)
MCHC: 33.8 g/dL (ref 30.0–36.0)
MCV: 93 fl (ref 78.0–100.0)
Platelets: 693 10*3/uL — ABNORMAL HIGH (ref 150.0–400.0)
RBC: 4.24 Mil/uL (ref 3.87–5.11)
RDW: 14.2 % (ref 11.5–15.5)
WBC: 12.7 10*3/uL — ABNORMAL HIGH (ref 4.0–10.5)

## 2014-05-20 NOTE — Telephone Encounter (Signed)
Elevated platelets is likely reactionary post-surgery. WBC are trending down. hgb back to nml.

## 2014-05-20 NOTE — Progress Notes (Signed)
Pre visit review using our clinic review tool, if applicable. No additional management support is needed unless otherwise documented below in the visit note. 

## 2014-05-20 NOTE — Patient Instructions (Signed)
Resume 1/2 tablet lisinopril daily. Let me know if you feel lightheaded.   Please see Dr Johney Maine in 2 weeks, as scheduled.

## 2014-05-21 NOTE — Telephone Encounter (Signed)
Patient notified of lab results

## 2014-05-22 DIAGNOSIS — Z09 Encounter for follow-up examination after completed treatment for conditions other than malignant neoplasm: Secondary | ICD-10-CM | POA: Insufficient documentation

## 2014-05-22 DIAGNOSIS — D72829 Elevated white blood cell count, unspecified: Secondary | ICD-10-CM | POA: Insufficient documentation

## 2014-05-22 DIAGNOSIS — I10 Essential (primary) hypertension: Secondary | ICD-10-CM | POA: Insufficient documentation

## 2014-05-22 HISTORY — DX: Essential (primary) hypertension: I10

## 2014-05-22 NOTE — Progress Notes (Signed)
Subjective:     Kendra Ball is a 69 y.o. female presents for f/u of hospital encounter for Obstructing mass of transverse colon s/p lap LOA/right colectomy 05/09/2014 by dr Johney Maine. [name] states she is doing well-has minimal pain, having daily BM, appetitie is good, but feels very tired. She will have surigal f/u next week.  Reviewed hospital encounter notes & labs: elevated WBC & mild anemia. Lisinopril was d/c'd in hospital due to low BP. Today she is 130/78. I will have her resume 10 mg lisinopril qd and continue ASA, levothyroxine, vit D, lexapro, iron, & wellbutrin.   The following portions of the patient's history were reviewed and updated as appropriate: allergies, current medications, past medical history, past social history, past surgical history and problem list.  Review of Systems Constitutional: negative for anorexia and fevers Ears, nose, mouth, throat, and face: negative for sore throat Respiratory: negative for cough Cardiovascular: negative for lower extremity edema and palpitations Gastrointestinal: negative for diarrhea, nausea and vomiting    Objective:    Pulse 77  Temp(Src) 97.8 F (36.6 C) (Temporal)  Resp 16  Ht 5' 3.5" (1.613 m)  Wt 192 lb (87.091 kg)  BMI 33.47 kg/m2  SpO2 96% Pulse 77  Temp(Src) 97.8 F (36.6 C) (Temporal)  Resp 16  Ht 5' 3.5" (1.613 m)  Wt 192 lb (87.091 kg)  BMI 33.47 kg/m2  SpO2 96% General appearance: alert, cooperative, appears stated age and no distress Head: Normocephalic, without obvious abnormality, atraumatic Eyes: negative findings: lids and lashes normal and conjunctivae and sclerae normal Lungs: clear to auscultation bilaterally Heart: regular rate and rhythm, S1, S2 normal, no murmur, click, rub or gallop Skin: surgical scar midline abdomen & few lap incisions. No s&s of infection, no drainage, mild erythema at navel, well approximated, no swelling.    Assessment:Plan     1. Essential hypertension Resume  10 mg lisinopril Pt to call if under 90/60. 2. Elevated WBC count Expected after surgery - CBC 3. Hospital discharge follow-up Record reviewed Will f/u w/surgeon next week.  F/u in May for chronic conditions/PRN lab results.

## 2014-06-03 ENCOUNTER — Other Ambulatory Visit: Payer: Self-pay | Admitting: *Deleted

## 2014-06-03 DIAGNOSIS — E039 Hypothyroidism, unspecified: Secondary | ICD-10-CM

## 2014-06-03 MED ORDER — LEVOTHYROXINE SODIUM 100 MCG PO TABS: ORAL_TABLET | ORAL | Status: DC

## 2014-06-12 ENCOUNTER — Other Ambulatory Visit: Payer: Self-pay | Admitting: *Deleted

## 2014-06-12 DIAGNOSIS — E039 Hypothyroidism, unspecified: Secondary | ICD-10-CM

## 2014-06-12 MED ORDER — LEVOTHYROXINE SODIUM 100 MCG PO TABS: ORAL_TABLET | ORAL | Status: DC

## 2014-06-25 ENCOUNTER — Other Ambulatory Visit (HOSPITAL_COMMUNITY): Payer: Self-pay | Admitting: Psychiatry

## 2014-06-27 ENCOUNTER — Telehealth (HOSPITAL_COMMUNITY): Payer: Self-pay

## 2014-06-27 NOTE — Telephone Encounter (Signed)
Kendra Ball picked up her prescription on 06/27/14  [place] [id number]  dlo

## 2014-07-01 ENCOUNTER — Ambulatory Visit (INDEPENDENT_AMBULATORY_CARE_PROVIDER_SITE_OTHER): Payer: 59 | Admitting: Psychiatry

## 2014-07-01 ENCOUNTER — Encounter (HOSPITAL_COMMUNITY): Payer: Self-pay | Admitting: Psychiatry

## 2014-07-01 VITALS — BP 132/84 | HR 76 | Ht 63.5 in | Wt 193.0 lb

## 2014-07-01 DIAGNOSIS — F17210 Nicotine dependence, cigarettes, uncomplicated: Secondary | ICD-10-CM | POA: Diagnosis not present

## 2014-07-01 DIAGNOSIS — F331 Major depressive disorder, recurrent, moderate: Secondary | ICD-10-CM | POA: Diagnosis not present

## 2014-07-01 DIAGNOSIS — F411 Generalized anxiety disorder: Secondary | ICD-10-CM

## 2014-07-01 MED ORDER — BUPROPION HCL ER (SR) 200 MG PO TB12: 200.0000 mg | ORAL_TABLET | Freq: Two times a day (BID) | ORAL | Status: DC

## 2014-07-01 MED ORDER — ESCITALOPRAM OXALATE 20 MG PO TABS: 20.0000 mg | ORAL_TABLET | Freq: Every day | ORAL | Status: DC

## 2014-07-01 MED ORDER — LORAZEPAM 1 MG PO TABS: ORAL_TABLET | ORAL | Status: DC

## 2014-07-01 NOTE — Progress Notes (Signed)
Patient ID: Kendra Ball, female   DOB: 03-Feb-1946, 69 y.o.   MRN: [id number]  [facility] Progress Note  Kendra Ball [id number] 69 y.o.  07/01/2014 1:07 PM  Chief Complaint: "doing ok"  History of Present Illness: Pt had colon resection in mid Jan. She is doing well now.  Pt continues to help her daughter and care for her grand kids. It sometimes overwhelms her.   Depression is up and down. About 2 days a week she doesn't get out of her pj's.  Reports some anhedonia and states she doesn't look as forward to grand kids coming over as much,. She does enjoy when they are there.  Denies isolation and crying spells. She feels some worthlessness on bad days. Energy is on the low side. Sleep is a little worse and it is hard for her to fall asleep. She takes Melatonin, 2 Tylenol PM and 1/2 Ativan.  Pt gets about 5-7 hrs but than naps 3 hrs twice a day. Appetite is good. Concentration is ok.   Anxiety is present but manageable. She takes Ativan BID. Some mornings she feels overwhelmed and not able to do anything.   Pt takes Lexapro, Wellbutrin and Ativan as prescribed and denies SE.   Suicidal Ideation: No does report some passive thoughts of death Plan Formed: No Patient has means to carry out plan: No  Homicidal Ideation: No Plan Formed: No Patient has means to carry out plan: No  Review of Systems: Psychiatric: Agitation: No Hallucination: No Depressed Mood: Yes Insomnia: No Hypersomnia: No Altered Concentration: No Feels Worthless: No Grandiose Ideas: No Belief In Special Powers: No New/Increased Substance Abuse: No Compulsions: No  Neurologic: Headache: No Seizure: No Paresthesias: No   Review of Systems  Constitutional: Negative for fever and chills.  HENT: Negative for congestion, ear discharge, nosebleeds and sore throat.   Eyes: Negative for blurred vision, double vision, pain and redness.  Respiratory: Negative for cough, shortness of  breath and wheezing.   Cardiovascular: Negative for chest pain, palpitations and leg swelling.  Gastrointestinal: Negative for heartburn, nausea, vomiting and abdominal pain.  Musculoskeletal: Negative for back pain, joint pain and neck pain.  Skin: Negative for itching and rash.  Neurological: Negative for dizziness, sensory change, seizures, loss of consciousness, weakness and headaches.  Psychiatric/Behavioral: Positive for depression. Negative for suicidal ideas, hallucinations and substance abuse. The patient is nervous/anxious. The patient does not have insomnia.      Past Medical Family, Social History: lives in [place] with her husband. Has 3 kids. Pt retired in 2010. She worked as a Network engineer at [employer].  reports that she has been smoking Cigarettes.  She has a 50 pack-year smoking history. She has never used smokeless tobacco. She reports that she drinks about 0.6 oz of alcohol per week. She reports that she does not use illicit drugs.  Family History  Problem Relation Age of Onset  . Cancer Mother     colon  . Dementia Mother   . Depression Mother   . Heart disease Father 42  . Heart disease Sister 23    stents, pacemaker  . Depression Sister   . Heart disease Brother 108    CABG, MI  . Obesity Daughter   . Suicidality Daughter   . Bipolar disorder Daughter   . Obesity Son   . Mental illness Daughter     bipolar  . Obesity Daughter   . Dementia Paternal Aunt    Past Medical  History  Diagnosis Date  . DEPRESSION 08/26/2008  . HYPERTENSION 08/26/2008  . HYPOTHYROIDISM 08/26/2008  . OSTEOPENIA 08/26/2008  . TOBACCO USER 12/25/2009  . Obesity   . Hx of colonic polyps     First noted on  colonoscopy 2012  . Hypercholesteremia     Outpatient Encounter Prescriptions as of 07/01/2014  Medication Sig  . acetaminophen (TYLENOL) 325 MG tablet Take 2 tablets (650 mg total) by mouth every 6 (six) hours as needed for mild pain (or Temp > 100).  [name] aspirin 81 MG  tablet Take 81 mg by mouth daily.  [name] buPROPion (WELLBUTRIN SR) 150 MG 12 hr tablet Take 1 tablet (150 mg total) by mouth 2 (two) times daily.  . cholecalciferol (VITAMIN D) 1000 UNITS tablet Take 1,000 Units by mouth daily.  . Cyanocobalamin (VITAMIN B-12 PO) Take 1 tablet by mouth daily.  . diclofenac sodium (VOLTAREN) 1 % GEL Apply 50 cent piece-sized amount to hip TID PRN.  [name] escitalopram (LEXAPRO) 20 MG tablet Take 1 tablet (20 mg total) by mouth daily.  . ferrous fumarate (HEMOCYTE - 106 MG FE) 325 (106 FE) MG TABS Take 1 tablet by mouth.  . levothyroxine (SYNTHROID, LEVOTHROID) 100 MCG tablet Take 1T po 1 hr before coffee & breakfast 5d/wk. Skip Wed. & Sun.  . lisinopril (PRINIVIL,ZESTRIL) 20 MG tablet Take 1 tablet (20 mg total) by mouth daily.  [name] LORazepam (ATIVAN) 1 MG tablet TAKE 1 TABLET BY MOUTH TWICE A DAY AS NEEDED FOR ANXIETY  . Magnesium 250 MG TABS Take 250 mg by mouth 2 (two) times daily.   . Melatonin 3 MG TABS Take 3 mg by mouth at bedtime as needed (for sleep).   . simvastatin (ZOCOR) 10 MG tablet Take 1 tablet (10 mg total) by mouth at bedtime.  [name] oxyCODONE (OXY IR/ROXICODONE) 5 MG immediate release tablet Take 1-2 tablets (5-10 mg total) by mouth every 4 (four) hours as needed for moderate pain, severe pain or breakthrough pain. (Patient not taking: Reported on 07/01/2014)  . saccharomyces boulardii (FLORASTOR) 250 MG capsule You can buy this over the counter and I would use it for the next couple weeks till your bowel movements are normal. (Patient not taking: Reported on 07/01/2014)    Past Psychiatric History/Hospitalization(s): Anxiety: Yes Bipolar Disorder: No Depression: Yes Mania: No Psychosis: No Schizophrenia: No Personality Disorder: No Hospitalization for psychiatric illness: No History of Electroconvulsive Shock Therapy: No Prior Suicide Attempts: No  Physical Exam: Constitutional:  BP 132/84 mmHg  Pulse 76  Ht 5' 3.5" (1.613 m)  Wt 193 lb (87.544 kg)   BMI 33.65 kg/m2  General Appearance: alert, oriented, no acute distress  Musculoskeletal: Strength & Muscle Tone: within normal limits Gait & Station: normal Patient leans: N/A  Mental Status Examination/Evaluation: Objective: Attitude: Calm and cooperative  Appearance: Fairly Groomed, appears to be stated age  Eye Contact::  Good  Speech:  Clear and Coherent and Normal Rate  Volume:  Normal  Mood:  depressed  Affect:  Congruent  Thought Process:  Linear and Logical  Orientation:  Full (Time, Place, and Person)  Thought Content:  WDL  Suicidal Thoughts:  No  Homicidal Thoughts:  No  Judgement:  Fair  Insight:  Fair  Concentration: good  Memory: Immediate-good Recent-good Remote-good  Recall: fair  Language: fair  Gait and Station: normal  [employer] of Knowledge: average  Psychomotor Activity:  Normal  Akathisia:  No  Handed:  Right  AIMS (if indicated): n/a  Assets:  Communication Skills Desire for Improvement Financial Resources/Insurance Housing Intimacy Leisure Time Resilience Social Support English as a second language teacher (Choose Three): Established Problem, Stable/Improving (1), Review of Psycho-Social Stressors (1), Established Problem, Worsening (2), Review of Medication Regimen & Side Effects (2) and Review of New Medication or Change in Dosage (2)  Assessment: AXIS I MDD- recurrent, moderate. GAD. Nicotine dependence  AXIS II Deferred   AXIS III Past Medical History  Diagnosis Date  . DEPRESSION 08/26/2008  . HYPERTENSION 08/26/2008  . HYPOTHYROIDISM 08/26/2008  . OSTEOPENIA 08/26/2008  . TOBACCO USER 12/25/2009  . Obesity   . Hx of colonic polyps     First noted on colonoscopy 2012  . Hypercholesteremia      AXIS IV other psychosocial or environmental problems and problems with primary support group  AXIS V 51-60 moderate symptoms       Treatment  Plan/Recommendations:  Plan of Care:  Medication management with supportive therapy. Risks/benefits and SE of the medication discussed. Pt verbalized understanding and verbal consent obtained for treatment.  Affirm with the patient that the medications are taken as ordered. Patient expressed understanding of how their medications were to be used.  -improvement of anxiety symptoms, worsening of depression symptoms   Laboratory: Reviewed labs with pt, no new labs today  Psychotherapy: Therapy: brief supportive therapy provided. Discussed psychosocial stressors in detail.   -discussed smoking cessation. Smoking cessation instruction/counseling given: counseled patient on the dangers of tobacco use, advised patient to stop smoking, and reviewed strategies to maximize success  -reviewed sleep hygiene in detail   Medications:  Lexapro 20mg  po qD for depression and anxiety Increase Wellbutrin SR to 200mg  po BID for depression Continue Ativan 1mg  po BID prn anxiety   Routine PRN Medications: Yes  Consultations: encouraged to f/up with PCP as needed, encouraged to continue individual therapy  Safety Concerns: Pt denies SI and is at an acute low risk for suicide.Patient told to call clinic if any problems occur. Patient advised to go to ER if they should develop SI/HI, side effects, or if symptoms worsen. Has crisis numbers to call if needed. Pt verbalized understanding.   Other: F/up in 3 months or sooner if needed     [name], MD 07/01/2014

## 2014-07-28 ENCOUNTER — Ambulatory Visit (INDEPENDENT_AMBULATORY_CARE_PROVIDER_SITE_OTHER): Payer: Medicare Other | Admitting: Nurse Practitioner

## 2014-07-28 ENCOUNTER — Encounter: Payer: Self-pay | Admitting: Nurse Practitioner

## 2014-07-28 VITALS — BP 113/75 | HR 71 | Temp 97.7°F | Ht 63.5 in | Wt 193.0 lb

## 2014-07-28 DIAGNOSIS — J029 Acute pharyngitis, unspecified: Secondary | ICD-10-CM | POA: Diagnosis not present

## 2014-07-28 LAB — POCT RAPID STREP A (OFFICE): Rapid Strep A Screen: NEGATIVE

## 2014-07-28 NOTE — Patient Instructions (Signed)
I think symptoms are related to allergy.  Start Neilmed sinus wash. Use daily for best allergy management.  If no improvement in symptoms, please let me know.  My office will call with lab results.  Please return in may for follow up of thyroid & blood pressure, preventive screenings.

## 2014-07-28 NOTE — Progress Notes (Signed)
   Subjective:    Patient ID: Kendra Ball, female    DOB: 1945/09/12, 69 y.o.   MRN: [id number]  Sore Throat  This is a new problem. The current episode started in the past 7 days (5 days). The problem has been unchanged. Neither side of throat is experiencing more pain than the other. There has been no fever. The pain is mild. Associated symptoms include congestion, coughing, headaches and swollen glands. Pertinent negatives include no plugged ear sensation or shortness of breath. Associated symptoms comments: achy. She has had exposure to strep (grand child had strep, dtr has URI). Exposure to: slseeping with windows open. She has tried cool liquids for the symptoms. The treatment provided significant relief.      Review of Systems  HENT: Positive for congestion.   Respiratory: Positive for cough. Negative for shortness of breath.   Neurological: Positive for headaches.       Objective:   Physical Exam  Constitutional: She is oriented to person, place, and time. She appears well-developed and well-nourished. No distress.  HENT:  Head: Normocephalic and atraumatic.  Right Ear: External ear normal.  Left Ear: External ear normal.  Mouth/Throat: No oropharyngeal exudate.  Mild erythema posterior pharynx   Eyes: Conjunctivae are normal. Right eye exhibits no discharge. Left eye exhibits no discharge.  Neck: Normal range of motion. No thyromegaly present.  Tonsillar nodes palpable & tender  Cardiovascular: Normal rate, regular rhythm and normal heart sounds.   No murmur heard. Pulmonary/Chest: Effort normal and breath sounds normal. No respiratory distress.  Lymphadenopathy:    She has cervical adenopathy.  Neurological: She is alert and oriented to person, place, and time.  Skin: Skin is warm and dry.  Psychiatric: She has a normal mood and affect. Her behavior is normal. Thought content normal.  Vitals reviewed.         Assessment & Plan:  1. Sore throat Likely  allergenic - POCT rapid strep A-neg - Upper Respiratory Culture  Daily sinus rinse. F/u in 6 weeks for htn, thyroid, lipids, check platelets

## 2014-07-28 NOTE — Progress Notes (Signed)
Pre visit review using our clinic review tool, if applicable. No additional management support is needed unless otherwise documented below in the visit note. 

## 2014-07-30 ENCOUNTER — Telehealth: Payer: Self-pay | Admitting: Nurse Practitioner

## 2014-07-30 NOTE — Telephone Encounter (Signed)
pls call pt: Advise Strep culture neg. Viral respiratory/sore throat illness. Continue with OV instructions.

## 2014-07-30 NOTE — Telephone Encounter (Signed)
Called and informed patient of lab results.  

## 2014-07-31 LAB — CULTURE, UPPER RESPIRATORY: Organism ID, Bacteria: NORMAL

## 2014-08-21 ENCOUNTER — Encounter: Payer: Self-pay | Admitting: Nurse Practitioner

## 2014-08-26 ENCOUNTER — Other Ambulatory Visit: Payer: Self-pay

## 2014-08-26 DIAGNOSIS — E039 Hypothyroidism, unspecified: Secondary | ICD-10-CM

## 2014-08-26 DIAGNOSIS — E785 Hyperlipidemia, unspecified: Secondary | ICD-10-CM

## 2014-08-26 MED ORDER — SIMVASTATIN 10 MG PO TABS: 10.0000 mg | ORAL_TABLET | Freq: Every day | ORAL | Status: DC

## 2014-08-26 MED ORDER — LEVOTHYROXINE SODIUM 100 MCG PO TABS: 100.0000 ug | ORAL_TABLET | Freq: Every day | ORAL | Status: DC

## 2014-08-26 NOTE — Telephone Encounter (Signed)
pls call & clarify if she was taking thyroid med 5d or 7d/week when last TSH was drawn. Script sent for simvastatin.

## 2014-08-26 NOTE — Telephone Encounter (Signed)
Please Advise Refill Request? Refill request for- Simvistatin 10mg  Last filled by MD on - 02/24/14 Last Appt - 05/20/14        Next Appt - 09/10/14 Pharmacy- [facility]

## 2014-08-26 NOTE — Addendum Note (Signed)
Addended by: Audley Hose on: 08/26/2014 11:36 AM   Modules accepted: Orders

## 2014-08-26 NOTE — Telephone Encounter (Addendum)
Called and spoke with patient. Last one was too take 7d a week. I have pended the medicine for you.

## 2014-09-10 ENCOUNTER — Encounter: Payer: Self-pay | Admitting: Nurse Practitioner

## 2014-09-10 ENCOUNTER — Encounter: Payer: Medicare Other | Admitting: Nurse Practitioner

## 2014-09-10 ENCOUNTER — Ambulatory Visit (INDEPENDENT_AMBULATORY_CARE_PROVIDER_SITE_OTHER): Payer: Medicare Other | Admitting: Nurse Practitioner

## 2014-09-10 VITALS — BP 126/81 | HR 87 | Temp 98.6°F | Ht 63.5 in | Wt 193.0 lb

## 2014-09-10 DIAGNOSIS — Z9884 Bariatric surgery status: Secondary | ICD-10-CM | POA: Insufficient documentation

## 2014-09-10 DIAGNOSIS — M8580 Other specified disorders of bone density and structure, unspecified site: Secondary | ICD-10-CM | POA: Diagnosis not present

## 2014-09-10 DIAGNOSIS — E039 Hypothyroidism, unspecified: Secondary | ICD-10-CM | POA: Diagnosis not present

## 2014-09-10 DIAGNOSIS — Z1239 Encounter for other screening for malignant neoplasm of breast: Secondary | ICD-10-CM | POA: Insufficient documentation

## 2014-09-10 DIAGNOSIS — Z23 Encounter for immunization: Secondary | ICD-10-CM

## 2014-09-10 DIAGNOSIS — M859 Disorder of bone density and structure, unspecified: Secondary | ICD-10-CM

## 2014-09-10 DIAGNOSIS — I10 Essential (primary) hypertension: Secondary | ICD-10-CM

## 2014-09-10 DIAGNOSIS — F17210 Nicotine dependence, cigarettes, uncomplicated: Secondary | ICD-10-CM

## 2014-09-10 DIAGNOSIS — E785 Hyperlipidemia, unspecified: Secondary | ICD-10-CM

## 2014-09-10 HISTORY — DX: Bariatric surgery status: Z98.84

## 2014-09-10 LAB — CBC WITH DIFFERENTIAL/PLATELET
Basophils Absolute: 0.2 10*3/uL — ABNORMAL HIGH (ref 0.0–0.1)
Basophils Relative: 2.2 % (ref 0.0–3.0)
Eosinophils Absolute: 0.1 10*3/uL (ref 0.0–0.7)
Eosinophils Relative: 0.8 % (ref 0.0–5.0)
HCT: 45.4 % (ref 36.0–46.0)
Hemoglobin: 15.2 g/dL — ABNORMAL HIGH (ref 12.0–15.0)
Lymphocytes Relative: 28.5 % (ref 12.0–46.0)
Lymphs Abs: 2.1 10*3/uL (ref 0.7–4.0)
MCHC: 33.5 g/dL (ref 30.0–36.0)
MCV: 92.8 fl (ref 78.0–100.0)
Monocytes Absolute: 0.4 10*3/uL (ref 0.1–1.0)
Monocytes Relative: 5.6 % (ref 3.0–12.0)
Neutro Abs: 4.7 10*3/uL (ref 1.4–7.7)
Neutrophils Relative %: 62.9 % (ref 43.0–77.0)
Platelets: 310 10*3/uL (ref 150.0–400.0)
RBC: 4.89 Mil/uL (ref 3.87–5.11)
RDW: 15.1 % (ref 11.5–15.5)
WBC: 7.5 10*3/uL (ref 4.0–10.5)

## 2014-09-10 LAB — COMPREHENSIVE METABOLIC PANEL
ALT: 19 U/L (ref 0–35)
AST: 20 U/L (ref 0–37)
Albumin: 4.3 g/dL (ref 3.5–5.2)
Alkaline Phosphatase: 62 U/L (ref 39–117)
BUN: 9 mg/dL (ref 6–23)
CO2: 26 mEq/L (ref 19–32)
Calcium: 9.6 mg/dL (ref 8.4–10.5)
Chloride: 103 mEq/L (ref 96–112)
Creatinine, Ser: 0.62 mg/dL (ref 0.40–1.20)
GFR: 101.65 mL/min (ref >60.00)
Glucose, Bld: 90 mg/dL (ref 70–99)
Potassium: 4.5 mEq/L (ref 3.5–5.1)
Sodium: 138 mEq/L (ref 135–145)
Total Bilirubin: 0.6 mg/dL (ref 0.2–1.2)
Total Protein: 6.9 g/dL (ref 6.0–8.3)

## 2014-09-10 LAB — LIPID PANEL
Cholesterol: 191 mg/dL (ref 0–200)
HDL: 83.8 mg/dL (ref >39.00)
LDL Cholesterol: 83 mg/dL (ref 0–99)
NonHDL: 107.2
Total CHOL/HDL Ratio: 2
Triglycerides: 120 mg/dL (ref 0.0–149.0)
VLDL: 24 mg/dL (ref 0.0–40.0)

## 2014-09-10 LAB — IRON AND TIBC
%SAT: 37 % (ref 20–55)
Iron: 138 ug/dL (ref 42–145)
TIBC: 371 ug/dL (ref 250–470)
UIBC: 233 ug/dL (ref 125–400)

## 2014-09-10 LAB — TSH: TSH: 1.32 u[IU]/mL (ref 0.35–4.50)

## 2014-09-10 LAB — VITAMIN D 25 HYDROXY (VIT D DEFICIENCY, FRACTURES): VITD: 36.09 ng/mL (ref 30.00–100.00)

## 2014-09-10 LAB — MICROALBUMIN / CREATININE URINE RATIO
Creatinine,U: 60.9 mg/dL
Microalb Creat Ratio: 1.1 mg/g (ref 0.0–30.0)
Microalb, Ur: <0.7 mg/dL (ref 0.0–1.9)

## 2014-09-10 MED ORDER — LISINOPRIL 10 MG PO TABS: 10.0000 mg | ORAL_TABLET | Freq: Every day | ORAL | Status: DC

## 2014-09-10 NOTE — Assessment & Plan Note (Signed)
Quit for 3 mos using chantix, but caused angry feelings  Discussed benefits of cutting back to 10 or less. Discussed strategies to cut back: set timer for 10 mins when urge hits to delay smoking, put cigs out of reach, chew nicotene gum when urge hits Pt encouraged that disease risk will decrease w/decreased amount, will try to cut back to 10 or less.

## 2014-09-10 NOTE — Patient Instructions (Signed)
COntinue all meds. I will send thyroid med after i get your results.  Develop lifelong habits of exercise most days of the week: take a 30 minute walk. The benefits include weight loss, lower risk for heart disease, diabetes, stroke, high blood pressure, lower rates of depression & dementia, better sleep quality & bone health.  Use strategies discussed to cut back cigarette smoking: delay 10 minutes, put them out of reach, use nicotene gum.  Please get mammogram, bone density, and chest CT done.  My office will call with lab results.  I will see you in 6 months, unless we need to discuss labs or you have concerns.

## 2014-09-10 NOTE — Progress Notes (Signed)
Pre visit review using our clinic review tool, if applicable. No additional management support is needed unless otherwise documented below in the visit note. 

## 2014-09-10 NOTE — Progress Notes (Addendum)
Subjective:     Kendra Ball is a 69 y.o. female presents for f/u hypothyroidism, HTN, hyperlipidemia, osteopenia. She wants to discuss smoking cessation. Had abnml MMG last yr followed by US revealing cyst L breast. 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   Hypothyroidism: taking synthroid qd as directed. No SE, No reported s&s hyper or hypothyroid disease. No wt or energy changes. HTN: well controlled on 10 mg lisinopril qd. CVD RF: smoker, fam Hx, sedentary, BMI over 41, age. No end organ damage. Hyperlipidemia: 31% reduction LDL after 6 mos simvastatin 10 mg qd. No intol SE. Discussed diet & lifestyle changes. Osteopenia: Last DEXA 2014 reviewed: spine -2.2, R femur -1.3, L femur -2.1. Hx pain l hip, comes & goes. Not complaining today. Stopped ca suplements due to calcified heart arteries viewed on chest CT. Has increased green foods to get more calcium thru food sources. Continues to take 1000 iu D3 qd. Discussed benefit of wt bearing exercise. Desires medical Tx if develops osteoporosis.  smoking cessation: 50 pk yrs. motivated to quit. Quit 4 times in past. Chantix worked, but caused anger. Has "all or none" mentality: discussed benefit of cutting back to 10 or less. Discussed strategies to smoke fewer cigs throughout day. 2015 LD chest CT revealed few small nodules. Will repeat this yr. Discussed benefits of screening.  abnml MMG 2015 followed by US revealing cyst L breast. Repeat this yr. 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 At risk for nutritional deficiencies: takes iron supplement. Denies constipation. Takes magnesium twice daily & B12 qd.   The following portions of the patient's history were reviewed and updated as appropriate: allergies, current medications, past family history, past medical history, past social history, past surgical history and problem list.  Review of Systems Constitutional: negative for fatigue, fevers and night sweats Eyes: positive for visual  disturbance and plans to make appt for eye exam this mo. Respiratory: negative for cough and dyspnea on exertion Cardiovascular: negative for chest pressure/discomfort, fatigue, irregular heart beat, lower extremity edema and near-syncope Gastrointestinal: negative for abdominal pain, change in bowel habits, constipation, diarrhea and dyspepsia Genitourinary:negative for postmenopausal bleeding Behavioral/Psych: negative, feels depression managed well. No recent med changes    Objective:    BP 126/81 mmHg  Pulse 87  Temp(Src) 98.6 F (37 C) (Oral)  Ht 5' 3.5" (1.613 m)  Wt 193 lb (87.544 kg)  BMI 33.65 kg/m2  SpO2 96% BP 126/81 mmHg  Pulse 87  Temp(Src) 98.6 F (37 C) (Oral)  Ht 5' 3.5" (1.613 m)  Wt 193 lb (87.544 kg)  BMI 33.65 kg/m2  SpO2 96% General appearance: alert, cooperative, appears stated age and no distress Head: Normocephalic, without obvious abnormality, atraumatic Eyes: negative findings: lids and lashes normal, conjunctivae and sclerae normal, corneas clear and pupils equal, round, reactive to light and accomodation Ears: normal TM's and external ear canals both ears Throat: lips, mucosa, and tongue normal; teeth and gums normal Neck: no adenopathy, no carotid bruit, supple, symmetrical, trachea midline and thyroid not enlarged, symmetric, no tenderness/mass/nodules Lungs: clear to auscultation bilaterally Breasts: normal appearance, no masses or tenderness Heart: regular rate and rhythm, S1, S2 normal, no murmur, click, rub or gallop Abdomen: soft, non-tender; bowel sounds normal; no masses,  no organomegaly Extremities: extremities normal, atraumatic, no cyanosis or edema Skin: few cherry angiomas trunk, dark mole L cheek-derm says seborrheic keratosis, 1 cm light brown mole L breast fold, color variation Lymph nodes: Cervical, supraclavicular, and axillary  nodes normal. Neurologic: Grossly normal    Assessment:Plan   1. Osteopenia - Vit D  25 hydroxy (rtn  osteoporosis monitoring) - DG Bone Density; Future  2. Hypothyroidism, unspecified hypothyroidism type Current dose 100 mcg, will renew script when labs result - TSH  3. Essential hypertension, benign Stable, continue - lisinopril (PRINIVIL,ZESTRIL) 10 MG tablet; Take 1 tablet (10 mg total) by mouth daily.  Dispense: 90 tablet; Refill: 1 - Microalbumin / creatinine urine ratio - Comprehensive metabolic panel  4. Hyperlipidemia Cont simvastatin 10 mg qd - Lipid panel  5. Breast cancer screening - MM DIGITAL SCREENING BILATERAL; Future  6. Cigarette nicotine dependence without complication Goal: cut back to 10 cigs qd - CT CHEST LUNG CA SCREEN LOW DOSE W/O CM; Future  7. History of gastric bypass At risk for nutritional def - Vit D  25 hydroxy (rtn osteoporosis monitoring) - Iron and TIBC - Comprehensive metabolic panel - CBC with Differential/Platelet  8. Need for pneumococcal vaccination - Pneumococcal conjugate vaccine 13-valent IM  F/u 6 mos. B12, htn, lipids, cmet, tsh

## 2014-09-10 NOTE — Assessment & Plan Note (Addendum)
Repeat DEXA 2016.  Stop Ca supplements, get through food. Decrease meat consumption to 2-3 times week to minimize ca loss.  Keep vitamin D level between 50-80. Daily walk.

## 2014-09-12 ENCOUNTER — Telehealth: Payer: Self-pay | Admitting: Nurse Practitioner

## 2014-09-12 DIAGNOSIS — E039 Hypothyroidism, unspecified: Secondary | ICD-10-CM

## 2014-09-12 DIAGNOSIS — E559 Vitamin D deficiency, unspecified: Secondary | ICD-10-CM

## 2014-09-12 MED ORDER — LEVOTHYROXINE SODIUM 100 MCG PO TABS: 100.0000 ug | ORAL_TABLET | Freq: Every day | ORAL | Status: DC

## 2014-09-12 MED ORDER — VITAMIN D3 1.25 MG (50000 UT) PO CAPS: 1.0000 | ORAL_CAPSULE | ORAL | Status: DC

## 2014-09-12 NOTE — Telephone Encounter (Signed)
Called and informed patient of lab results.  

## 2014-09-12 NOTE — Telephone Encounter (Signed)
pls call pt: Advise Overall labs look good. Vit D needs to come up. Start prescription. Take weekly for 12 weeks. Triglycerides up some, decrease intake of refined sugars & grains. No med changes. Sent in thyroid med.

## 2014-09-18 ENCOUNTER — Other Ambulatory Visit: Payer: Self-pay | Admitting: Nurse Practitioner

## 2014-09-25 ENCOUNTER — Other Ambulatory Visit (HOSPITAL_COMMUNITY): Payer: Self-pay | Admitting: Psychiatry

## 2014-09-26 ENCOUNTER — Other Ambulatory Visit (HOSPITAL_COMMUNITY): Payer: Self-pay | Admitting: Psychiatry

## 2014-09-30 NOTE — Telephone Encounter (Signed)
Wellbutrin refill request declined at this time as patient reported by phone call she had enough until appointment with Dr. Doyne Keel on 10/02/14 at 1pm.

## 2014-10-02 ENCOUNTER — Ambulatory Visit (INDEPENDENT_AMBULATORY_CARE_PROVIDER_SITE_OTHER): Payer: 59 | Admitting: Psychiatry

## 2014-10-02 ENCOUNTER — Encounter (HOSPITAL_COMMUNITY): Payer: Self-pay | Admitting: Psychiatry

## 2014-10-02 VITALS — BP 139/65 | HR 63 | Ht 62.0 in | Wt 196.6 lb

## 2014-10-02 DIAGNOSIS — F411 Generalized anxiety disorder: Secondary | ICD-10-CM | POA: Diagnosis not present

## 2014-10-02 DIAGNOSIS — F17210 Nicotine dependence, cigarettes, uncomplicated: Secondary | ICD-10-CM

## 2014-10-02 DIAGNOSIS — F331 Major depressive disorder, recurrent, moderate: Secondary | ICD-10-CM | POA: Diagnosis not present

## 2014-10-02 MED ORDER — BUPROPION HCL ER (SR) 200 MG PO TB12: 200.0000 mg | ORAL_TABLET | Freq: Two times a day (BID) | ORAL | Status: DC

## 2014-10-02 MED ORDER — ESCITALOPRAM OXALATE 20 MG PO TABS: 20.0000 mg | ORAL_TABLET | Freq: Every day | ORAL | Status: DC

## 2014-10-02 MED ORDER — LORAZEPAM 1 MG PO TABS: ORAL_TABLET | ORAL | Status: DC

## 2014-10-02 NOTE — Progress Notes (Signed)
[facility] Progress Note  Kendra Ball [id number] 69 y.o.  10/02/2014 1:10 PM  Chief Complaint: "the meds are really working"  History of Present Illness: Pt continues to help her daughter and care for her grand kids. It sometimes overwhelms her. "Life is good".   States she is doing better. She is actually enjoying herself on outings. No longer overwhelmed by activities. Her sisters have told pt she is more engaged than before.   Depression is improved and when in comes it lasts for a few hours. Reports anhedonia is improving. Denies worthlessness and hopelessness, isolation and crying spells.   Energy is better. It is hard for her to fall asleep. She takes Melatonin and 2 Benadryl  Pt gets about 6-9 hrs. She is no longer napping during the day. Appetite is good. Concentration is ok.   Anxiety is present but manageable. It is improved as compared to before. She takes Ativan BID most days. When she does get anxious she is unable to identify the triggers.    Pt is trying to quit smoking. Pt is using the nicotine gum.   Pt takes Lexapro, Wellbutrin and Ativan as prescribed and denies SE.   Suicidal Ideation: No  Plan Formed: No Patient has means to carry out plan: No  Homicidal Ideation: No Plan Formed: No Patient has means to carry out plan: No  Review of Systems: Psychiatric: Agitation: No Hallucination: No Depressed Mood: No Insomnia: No Hypersomnia: No Altered Concentration: No Feels Worthless: No Grandiose Ideas: No Belief In Special Powers: No New/Increased Substance Abuse: No Compulsions: No  Neurologic: Headache: No Seizure: No Paresthesias: No   Review of Systems  Constitutional: Negative for fever and chills.  HENT: Negative for congestion, ear discharge, nosebleeds and sore throat.   Eyes: Negative for blurred vision, double vision, pain and redness.  Respiratory: Negative for cough, shortness of breath and wheezing.    Cardiovascular: Negative for chest pain, palpitations and leg swelling.  Gastrointestinal: Negative for heartburn, nausea, vomiting and abdominal pain.  Musculoskeletal: Positive for back pain and joint pain. Negative for neck pain.  Skin: Negative for itching and rash.  Neurological: Negative for dizziness, sensory change, seizures, loss of consciousness, weakness and headaches.  Psychiatric/Behavioral: Negative for depression, suicidal ideas, hallucinations and substance abuse. The patient is nervous/anxious. The patient does not have insomnia.      Past Medical Family, Social History: lives in [place] with her husband. Has 3 kids. Pt retired in 2010. She worked as a Network engineer at [employer].  reports that she has been smoking Cigarettes.  She has a 50 pack-year smoking history. She has never used smokeless tobacco. She reports that she drinks about 0.6 oz of alcohol per week. She reports that she does not use illicit drugs.  Family History  Problem Relation Age of Onset  . Cancer Mother     colon  . Dementia Mother   . Depression Mother   . Heart disease Father 24  . Heart disease Sister 7    stents, pacemaker  . Depression Sister   . Heart disease Brother 61    CABG, MI  . Obesity Daughter   . Suicidality Daughter   . Bipolar disorder Daughter   . Obesity Son   . Mental illness Daughter     bipolar  . Obesity Daughter   . Dementia Paternal Aunt    Past Medical History  Diagnosis Date  . DEPRESSION 08/26/2008  . HYPERTENSION 08/26/2008  . HYPOTHYROIDISM  08/26/2008  . OSTEOPENIA 08/26/2008  . TOBACCO USER 12/25/2009  . Obesity   . Hx of colonic polyps     First noted on  colonoscopy 2012  . Hypercholesteremia     Outpatient Encounter Prescriptions as of 10/02/2014  Medication Sig  . acetaminophen (TYLENOL) 325 MG tablet Take 2 tablets (650 mg total) by mouth every 6 (six) hours as needed for mild pain (or Temp > 100).  [name] aspirin 81 MG tablet Take 81 mg by  mouth daily.  [name] buPROPion (WELLBUTRIN SR) 200 MG 12 hr tablet Take 1 tablet (200 mg total) by mouth 2 (two) times daily.  . Cholecalciferol (VITAMIN D3) 50000 UNITS CAPS Take 1 capsule by mouth every 7 (seven) days.  . Cyanocobalamin (VITAMIN B-12 PO) Take 1 tablet by mouth daily.  [name] escitalopram (LEXAPRO) 20 MG tablet Take 1 tablet (20 mg total) by mouth daily.  . ferrous fumarate (HEMOCYTE - 106 MG FE) 325 (106 FE) MG TABS Take 1 tablet by mouth.  . levothyroxine (SYNTHROID, LEVOTHROID) 100 MCG tablet Take 1 tablet (100 mcg total) by mouth daily before breakfast.  . lisinopril (PRINIVIL,ZESTRIL) 10 MG tablet Take 1 tablet (10 mg total) by mouth daily.  [name] LORazepam (ATIVAN) 1 MG tablet TAKE 1 TABLET BY MOUTH TWICE A DAY AS NEEDED FOR ANXIETY  . Magnesium 250 MG TABS Take 250 mg by mouth 2 (two) times daily.   . Melatonin 3 MG TABS Take 3 mg by mouth at bedtime as needed (for sleep).   . simvastatin (ZOCOR) 10 MG tablet Take 1 tablet (10 mg total) by mouth at bedtime.   No facility-administered encounter medications on file as of 10/02/2014.    Past Psychiatric History/Hospitalization(s): Anxiety: Yes Bipolar Disorder: No Depression: Yes Mania: No Psychosis: No Schizophrenia: No Personality Disorder: No Hospitalization for psychiatric illness: No History of Electroconvulsive Shock Therapy: No Prior Suicide Attempts: No  Physical Exam: Constitutional:  BP 139/65 mmHg  Pulse 63  Ht 5\' 2"  (1.575 m)  Wt 196 lb 9.6 oz (89.177 kg)  BMI 35.95 kg/m2  General Appearance: alert, oriented, no acute distress  Musculoskeletal: Strength & Muscle Tone: within normal limits Gait & Station: normal Patient leans: N/A  Mental Status Examination/Evaluation: Objective: Attitude: Calm and cooperative  Appearance: Fairly Groomed, appears to be stated age  Eye Contact::  Good  Speech:  Clear and Coherent and Normal Rate  Volume:  Normal  Mood:  euthymic  Affect:  Congruent  Thought Process:   Linear and Logical  Orientation:  Full (Time, Place, and Person)  Thought Content:  WDL  Suicidal Thoughts:  No  Homicidal Thoughts:  No  Judgement:  Fair  Insight:  Fair  Concentration: good  Memory: Immediate-good Recent-good Remote-good  Recall: fair  Language: fair  Gait and Station: normal  [employer] of Knowledge: average  Psychomotor Activity:  Normal  Akathisia:  No  Handed:  Right  AIMS (if indicated): n/a  Assets:  Armed forces logistics/support/administrative officer Desire for Improvement Financial Resources/Insurance Housing Intimacy Leisure Time Resilience Social Support Astronomer Decision Making (Choose Three): Established Problem, Stable/Improving (1), Review of Psycho-Social Stressors (1), Review or order clinical lab tests (1) and Review of Medication Regimen & Side Effects (2)  Assessment: AXIS I MDD- recurrent, moderate. GAD. Nicotine dependence  AXIS II Deferred        Treatment Plan/Recommendations:  Plan of Care:  Medication management with supportive therapy. Risks/benefits and SE of the medication discussed.  Pt verbalized understanding and verbal consent obtained for treatment.  Affirm with the patient that the medications are taken as ordered. Patient expressed understanding of how their medications were to be used.     Laboratory: Reviewed labs 09/10/2014 CMP WNL, Lipid panel WNL, Hb 15.2, TSH WNL  Psychotherapy: Therapy: brief supportive therapy provided. Discussed psychosocial stressors in detail.   -discussed smoking cessation. Smoking cessation instruction/counseling given: counseled patient on the dangers of tobacco use, advised patient to stop smoking, and reviewed strategies to maximize success  -reviewed sleep hygiene in detail    Medications:  Lexapro 20mg  po qD for depression and anxiety Wellbutrin SR to 200mg  po BID for depression Continue Ativan 1mg  po BID prn anxiety   Routine PRN  Medications: Yes  Consultations: encouraged to f/up with PCP as needed, encouraged to continue individual therapy  Safety Concerns: Pt denies SI and is at an acute low risk for suicide.Patient told to call clinic if any problems occur. Patient advised to go to ER if they should develop SI/HI, side effects, or if symptoms worsen. Has crisis numbers to call if needed. Pt verbalized understanding.   Other: F/up in 5 months or sooner if needed     [name], MD 10/02/2014

## 2014-10-07 ENCOUNTER — Ambulatory Visit
Admission: RE | Admit: 2014-10-07 | Discharge: 2014-10-07 | Disposition: A | Discharge status: Home / Self Care | Payer: Medicare Other | Source: Ambulatory Visit | Attending: Nurse Practitioner | Admitting: Nurse Practitioner

## 2014-10-07 DIAGNOSIS — M8580 Other specified disorders of bone density and structure, unspecified site: Secondary | ICD-10-CM

## 2014-10-07 DIAGNOSIS — Z1239 Encounter for other screening for malignant neoplasm of breast: Secondary | ICD-10-CM

## 2014-10-08 ENCOUNTER — Ambulatory Visit
Admission: RE | Admit: 2014-10-08 | Discharge: 2014-10-08 | Disposition: A | Discharge status: Home / Self Care | Payer: Medicare Other | Source: Ambulatory Visit | Attending: Nurse Practitioner | Admitting: Nurse Practitioner

## 2014-10-08 DIAGNOSIS — F17210 Nicotine dependence, cigarettes, uncomplicated: Secondary | ICD-10-CM

## 2014-10-09 ENCOUNTER — Telehealth: Payer: Self-pay | Admitting: Nurse Practitioner

## 2014-10-09 NOTE — Telephone Encounter (Signed)
Patient aware.

## 2014-10-09 NOTE — Telephone Encounter (Signed)
pls call pt: Advise DEXA scan shows normal bone density Ct lung shows no changes. Recmd repeating in 2 yrs.

## 2014-10-15 NOTE — Telephone Encounter (Signed)
Patient aware of results.  Appt scheduled 11/10/14 @1pm .

## 2014-10-15 NOTE — Telephone Encounter (Signed)
Clarified DEXA interpretation after reviewing paper copy. Pt has osteoporosis. spine -1.9,L neck of femur is -2.7. Thinning has progressed since last DEXA: 5.1 % in femur.  Need to discuss treatment. Pt is smoker.

## 2014-10-30 ENCOUNTER — Encounter (HOSPITAL_COMMUNITY): Payer: Self-pay | Admitting: Psychology

## 2014-10-30 DIAGNOSIS — F331 Major depressive disorder, recurrent, moderate: Secondary | ICD-10-CM

## 2014-10-30 DIAGNOSIS — F411 Generalized anxiety disorder: Secondary | ICD-10-CM

## 2014-10-30 NOTE — Progress Notes (Signed)
Kendra Ball is a 69 y.o. female patient being discharged from counseling as last seen on 04/08/14.  Outpatient Therapist Discharge Summary  SHALANDRIA ELSBERND    1945/09/17   Admission Date: 02/19/14   Discharge Date:  10/30/14 Reason for Discharge:  Not active with counseling Diagnosis:   Major depressive disorder, recurrent episode, moderate  GAD (generalized anxiety disorder)   Comments:  Pt will continued w/ Dr. Doyne Keel as scheduled.   Jenne Campus, LPC

## 2014-11-10 ENCOUNTER — Ambulatory Visit: Payer: Self-pay | Admitting: Nurse Practitioner

## 2014-11-11 ENCOUNTER — Ambulatory Visit (INDEPENDENT_AMBULATORY_CARE_PROVIDER_SITE_OTHER): Payer: Medicare Other | Admitting: Nurse Practitioner

## 2014-11-11 VITALS — BP 105/71 | HR 69 | Temp 98.2°F | Resp 16 | Ht 63.5 in | Wt 193.0 lb

## 2014-11-11 DIAGNOSIS — F430 Acute stress reaction: Secondary | ICD-10-CM | POA: Diagnosis not present

## 2014-11-11 DIAGNOSIS — M810 Age-related osteoporosis without current pathological fracture: Secondary | ICD-10-CM | POA: Insufficient documentation

## 2014-11-11 MED ORDER — ALENDRONATE SODIUM 70 MG PO TABS: 70.0000 mg | ORAL_TABLET | ORAL | Status: DC

## 2014-11-11 NOTE — Progress Notes (Signed)
Subjective:     Kendra Ball is a 69 y.o. female presents to discuss results of DEXA scan & has new complaint of feeling overwhelmed in grocery store & losing phone while in store. She & family are worried that these may be symptoms of dementia. DEXA: lumbar T score is -1.9. She was -1.6 in 2009. L femur is -2.7, was -0.9 in 2009. I discussed pharmaceutical & non-pharmaceutical interventions to slow down further thinning. Discussed SE of meds & proper administration. Pt wishes to start weekly dosing of alendronate. She understands that smoking contributes to bone loss.  New complaint: while on vacation at [place] area, she was in grocery store & felt too overwhelmed to find some of her items. She left without them & became upset when she got to car because she feared she might be developing alzheimer's, "like" her mother. Also, her family was concerned that she lost her cell phone in a store 3 times before leaving. Pt says she was using a shopping list on her phone & set phone down whenlooking at items. She feels, trip was somewhat stressful as she had daughter & grandchildren on trip.  Pt sees psychiatry for depression.  The following portions of the patient's history were reviewed and updated as appropriate: allergies, current medications, past family history, past medical history, past social history, past surgical history and problem list.  Review of Systems Pertinent items are noted in HPI.    Objective:    BP 105/71 mmHg  Pulse 69  Temp(Src) 98.2 F (36.8 C) (Temporal)  Resp 16  Ht 5' 3.5" (1.613 m)  Wt 193 lb (87.544 kg)  BMI 33.65 kg/m2  SpO2 95% Eyes: negative findings: lids and lashes normal and conjunctivae and sclerae normal Neurologic: Mental status: Alert, oriented, thought content appropriate, scored 30 on MMSE    Assessment:Plan     1. Osteoporosis New start - alendronate (FOSAMAX) 70 MG tablet; Take 1 tablet (70 mg total) by mouth every 7 (seven) days. Take  with a full glass of water on an empty stomach.  Dispense: 4 tablet; Refill: 11 HO given for non-medicinal interventions  2. Stress reaction Perfect score on MMSE Pt concerned about forgetfulness Wants to take watch & wait approach.  Recommend discussing w/psychiatry of has further episodes or consider neurology eval.  F/u 3 mos

## 2014-11-11 NOTE — Patient Instructions (Signed)
Please start alendronate. Take 1 tablet weekly as directed.  Let s know if you develop indigestion. Plan: continue for 2 years & repeat bone density test. Finish vitamin D & have level checked when finished. You will likely need maintenance dose. Read handout on non-medicinal interventions to slow bone thinning. It has been a pleasure to partner with you in your healthcare! I will miss you!  Osteoporosis Throughout your life, your body breaks down old bone and replaces it with new bone. As you get older, your body does not replace bone as quickly as it breaks it down. By the age of 76 years, most people begin to gradually lose bone because of the imbalance between bone loss and replacement. Some people lose more bone than others. Bone loss beyond a specified normal degree is considered osteoporosis.  Osteoporosis affects the strength and durability of your bones. The inside of the ends of your bones and your flat bones, like the bones of your pelvis, look like honeycomb, filled with tiny open spaces. As bone loss occurs, your bones become less dense. This means that the open spaces inside your bones become bigger and the walls between these spaces become thinner. This makes your bones weaker. Bones of a person with osteoporosis can become so weak that they can break (fracture) during minor accidents, such as a simple fall. CAUSES  The following factors have been associated with the development of osteoporosis:  Smoking.  Drinking more than 2 alcoholic drinks several days per week.  Long-term use of certain medicines:  Corticosteroids.  Chemotherapy medicines.  Thyroid medicines.  Antiepileptic medicines.  Gonadal hormone suppression medicine.  Immunosuppression medicine.  Being underweight.  Lack of physical activity.  Lack of exposure to the sun. This can lead to vitamin D deficiency.  Certain medical conditions:  Certain inflammatory bowel diseases, such as Crohn disease and  ulcerative colitis.  Diabetes.  Hyperthyroidism.  Hyperparathyroidism. RISK FACTORS Anyone can develop osteoporosis. However, the following factors can increase your risk of developing osteoporosis:  Gender--Women are at higher risk than men.  Age--Being older than 50 years increases your risk.  Ethnicity--White and Asian people have an increased risk.  Weight --Being extremely underweight can increase your risk of osteoporosis.  Family history of osteoporosis--Having a family member who has developed osteoporosis can increase your risk. SYMPTOMS  Usually, people with osteoporosis have no symptoms.  DIAGNOSIS  Signs during a physical exam that may prompt your caregiver to suspect osteoporosis include:  Decreased height. This is usually caused by the compression of the bones that form your spine (vertebrae) because they have weakened and become fractured.  A curving or rounding of the upper back (kyphosis). To confirm signs of osteoporosis, your caregiver may request a procedure that uses 2 low-dose X-ray beams with different levels of energy to measure your bone mineral density (dual-energy X-ray absorptiometry [DXA]). Also, your caregiver may check your level of vitamin D. TREATMENT  The goal of osteoporosis treatment is to strengthen bones in order to decrease the risk of bone fractures. There are different types of medicines available to help achieve this goal. Some of these medicines work by slowing the processes of bone loss. Some medicines work by increasing bone density. Treatment also involves making sure that your levels of calcium and vitamin D are adequate. PREVENTION  There are things you can do to help prevent osteoporosis. Adequate intake of calcium and vitamin D can help you achieve optimal bone mineral density. Regular exercise can also help, especially  resistance and weight-bearing activities. If you smoke, quitting smoking is an important part of osteoporosis  prevention. MAKE SURE YOU:  Understand these instructions.  Will watch your condition.  Will get help right away if you are not doing well or get worse. FOR MORE INFORMATION www.osteo.org and EquipmentWeekly.com.ee Document Released: 01/19/2005 Document Revised: 08/06/2012 Document Reviewed: 03/26/2011 [facility] Patient Information 2015 [place], [place]. This information is not intended to replace advice given to you by your health care provider. Make sure you discuss any questions you have with your health care provider.

## 2014-11-11 NOTE — Progress Notes (Signed)
Pre visit review using our clinic review tool, if applicable. No additional management support is needed unless otherwise documented below in the visit note. 

## 2014-11-24 ENCOUNTER — Other Ambulatory Visit: Payer: Self-pay | Admitting: *Deleted

## 2014-11-24 DIAGNOSIS — E785 Hyperlipidemia, unspecified: Secondary | ICD-10-CM

## 2014-11-24 MED ORDER — SIMVASTATIN 10 MG PO TABS: 10.0000 mg | ORAL_TABLET | Freq: Every day | ORAL | Status: DC

## 2014-12-24 ENCOUNTER — Telehealth: Payer: Self-pay | Admitting: *Deleted

## 2014-12-24 NOTE — Telephone Encounter (Signed)
Pt will an appointment with new provider and vitamin D levels rechecked. Sh emay not need the prescribed Vitamin D if her levels are improved.

## 2014-12-24 NOTE — Telephone Encounter (Signed)
Patient requesting refill on D3-50 50,000 units last office visit was was 11/11/2014 last Vit D level was drawn on 09/10/2014 . Please advise.

## 2014-12-24 NOTE — Telephone Encounter (Signed)
Spoke to Pt. And she is scheduled to come in.

## 2014-12-25 ENCOUNTER — Other Ambulatory Visit: Payer: Self-pay

## 2014-12-25 DIAGNOSIS — E559 Vitamin D deficiency, unspecified: Secondary | ICD-10-CM

## 2014-12-25 MED ORDER — VITAMIN D3 1.25 MG (50000 UT) PO CAPS: 1.0000 | ORAL_CAPSULE | ORAL | Status: DC

## 2014-12-30 ENCOUNTER — Encounter: Payer: Self-pay | Admitting: Family Medicine

## 2014-12-30 ENCOUNTER — Ambulatory Visit (INDEPENDENT_AMBULATORY_CARE_PROVIDER_SITE_OTHER): Payer: Medicare Other | Admitting: Family Medicine

## 2014-12-30 VITALS — BP 132/84 | HR 68 | Temp 98.0°F | Resp 18 | Ht 63.0 in | Wt 199.0 lb

## 2014-12-30 DIAGNOSIS — M810 Age-related osteoporosis without current pathological fracture: Secondary | ICD-10-CM

## 2014-12-30 DIAGNOSIS — Z23 Encounter for immunization: Secondary | ICD-10-CM | POA: Diagnosis not present

## 2014-12-30 DIAGNOSIS — Z716 Tobacco abuse counseling: Secondary | ICD-10-CM

## 2014-12-30 DIAGNOSIS — Z122 Encounter for screening for malignant neoplasm of respiratory organs: Secondary | ICD-10-CM | POA: Insufficient documentation

## 2014-12-30 DIAGNOSIS — Z720 Tobacco use: Secondary | ICD-10-CM | POA: Diagnosis not present

## 2014-12-30 NOTE — Progress Notes (Signed)
Subjective:    Patient ID: Kendra Ball, female    DOB: November 10, 1945, 69 y.o.   MRN: [id number]  HPI   Osteoporosis on DEXA: Patient presents to follow-up on low vitamin D and start of Fosamax after DEXA scan with osteoporosis. Patient states she hasn't started her Fosamax yet. She reports she was out of town for a month, and then has only been back for one week and forgot this week. Patient understands how to take Fosamax, and states that it is difficult for her to remember to take it when she drinks coffee first thing in the morning. She has been supplying with vitamin D 5000 units daily. Her last vitamin D was approximately 36 3 months ago. Patient reports her left hip is what bothers her the most, and she has trouble laying on that side. Her left femur T score in June 2016 was -2.7. Lumbar spine T score -1.9. Patient continues to smoke.  Smoking abuse: Patient states she has quit lots of times, she has been prescribed Chantix in the past and felt that it worked for her smoking cessation, unfortunately she felt like "she lost her mind " on medication. Patient states that she is not ready to quit yet, but no she is going to have to here in the future.  Past Medical History  Diagnosis Date  . DEPRESSION 08/26/2008  . HYPERTENSION 08/26/2008  . HYPOTHYROIDISM 08/26/2008  . OSTEOPENIA 08/26/2008  . TOBACCO USER 12/25/2009  . Obesity   . Hx of colonic polyps     First noted on  colonoscopy 2012  . Hypercholesteremia    Allergies  Allergen Reactions  . Morphine Sulfate     Swelling around injection area   Social History   Social History  . Marital Status: Married    Spouse Name: N/A  . Number of Children: 3  . Years of Education: N/A   Occupational History  . RETIRED    Social History Main Topics  . Smoking status: Current Every Day Smoker -- 1.00 packs/day for 50 years    Types: Cigarettes  . Smokeless tobacco: Never Used  . Alcohol Use: 0.6 oz/week    1 Glasses of wine per week       Comment: 5-6x/month will have one glass of wine  . Drug Use: No  . Sexual Activity: No   Other Topics Concern  . Not on file   Social History Narrative   Ms. Wynns lives in [place] with her husband. She has 3 grown children and several grand children. She has joint custody of 3 of her grand children as her daughter has bipolar disorder and has needed assistance with children in past.     Review of Systems Negative, with the exception of above mentioned in HPI     Objective:   Physical Exam BP 132/84 mmHg  Pulse 68  Temp(Src) 98 F (36.7 C) (Oral)  Resp 18  Ht 5\' 3"  (1.6 m)  Wt 199 lb (90.266 kg)  BMI 35.26 kg/m2  SpO2 96% Gen: Afebrile. No acute distress. Nontoxic in appearance, well-developed, well-nourished Caucasian female. HENT: AT. [place]. MMM.  CV: RRR no murmur, no edema, +2/4 P posterior tibialis pulses Chest: CTAB, no wheeze or crackles. Diminished air movement bilaterally.    Assessment & Plan:  1. Osteoporosis - start Fosamax as directed. - pt will be called once labs are available to evaluate need for prescribed vit D - Vit D  25 hydroxy (rtn osteoporosis monitoring) - DEXA  every 2 years  2. Health maintenance:  - Flu shot administered toady.  - Re- offer zostvax next visit.   3. Smoking cessation:  - Discussion on negative affects on bone health (as well as others) - Patient not ready to quit yet. - AVS on smoking cessation and 1-800 line.   F/U dependent on labs, otherwise preventive visit in MAY

## 2014-12-30 NOTE — Addendum Note (Signed)
Addended by: Ralph Dowdy on: 12/30/2014 04:32 PM   Modules accepted: Orders

## 2014-12-30 NOTE — Progress Notes (Signed)
Pre visit review using our clinic review tool, if applicable. No additional management support is needed unless otherwise documented below in the visit note. 

## 2014-12-30 NOTE — Patient Instructions (Signed)
Start Fosamax! I will call you with the results of vitamin D and discuss if you need prescribed vitamin D.  You should always take vitamin D supplement, but may not need such high dose if D is normal.  Flu shot given today Stop smoking if you need help call.   Smoking Cessation Quitting smoking is important to your health and has many advantages. However, it is not always easy to quit since nicotine is a very addictive drug. Oftentimes, people try 3 times or more before being able to quit. This document explains the best ways for you to prepare to quit smoking. Quitting takes hard work and a lot of effort, but you can do it. ADVANTAGES OF QUITTING SMOKING  You will live longer, feel better, and live better.  Your body will feel the impact of quitting smoking almost immediately.  Within 20 minutes, blood pressure decreases. Your pulse returns to its normal level.  After 8 hours, carbon monoxide levels in the blood return to normal. Your oxygen level increases.  After 24 hours, the chance of having a heart attack starts to decrease. Your breath, hair, and body stop smelling like smoke.  After 48 hours, damaged nerve endings begin to recover. Your sense of taste and smell improve.  After 72 hours, the body is virtually free of nicotine. Your bronchial tubes relax and breathing becomes easier.  After 2 to 12 weeks, lungs can hold more air. Exercise becomes easier and circulation improves.  The risk of having a heart attack, stroke, cancer, or lung disease is greatly reduced.  After 1 year, the risk of coronary heart disease is cut in half.  After 5 years, the risk of stroke falls to the same as a nonsmoker.  After 10 years, the risk of lung cancer is cut in half and the risk of other cancers decreases significantly.  After 15 years, the risk of coronary heart disease drops, usually to the level of a nonsmoker.  If you are pregnant, quitting smoking will improve your chances of having  a healthy baby.  The people you live with, especially any children, will be healthier.  You will have extra money to spend on things other than cigarettes. QUESTIONS TO THINK ABOUT BEFORE ATTEMPTING TO QUIT You may want to talk about your answers with your health care provider.  Why do you want to quit?  If you tried to quit in the past, what helped and what did not?  What will be the most difficult situations for you after you quit? How will you plan to handle them?  Who can help you through the tough times? Your family? Friends? A health care provider?  What pleasures do you get from smoking? What ways can you still get pleasure if you quit? Here are some questions to ask your health care provider:  How can you help me to be successful at quitting?  What medicine do you think would be best for me and how should I take it?  What should I do if I need more help?  What is smoking withdrawal like? How can I get information on withdrawal? GET READY  Set a quit date.  Change your environment by getting rid of all cigarettes, ashtrays, matches, and lighters in your home, car, or work. Do not let people smoke in your home.  Review your past attempts to quit. Think about what worked and what did not. GET SUPPORT AND ENCOURAGEMENT You have a better chance of being successful if  you have help. You can get support in many ways.  Tell your family, friends, and coworkers that you are going to quit and need their support. Ask them not to smoke around you.  Get individual, group, or telephone counseling and support. Programs are available at [employer] and health centers. Call your local health department for information about programs in your area.  Spiritual beliefs and practices may help some smokers quit.  Download a "quit meter" on your computer to keep track of quit statistics, such as how long you have gone without smoking, cigarettes not smoked, and money saved.  Get a  self-help book about quitting smoking and staying off tobacco. [place] yourself from urges to smoke. Talk to someone, go for a walk, or occupy your time with a task.  Change your normal routine. Take a different route to work. Drink tea instead of coffee. Eat breakfast in a different place.  Reduce your stress. Take a hot bath, exercise, or read a book.  Plan something enjoyable to do every day. Reward yourself for not smoking.  Explore interactive web-based programs that specialize in helping you quit. GET MEDICINE AND USE IT CORRECTLY Medicines can help you stop smoking and decrease the urge to smoke. Combining medicine with the above behavioral methods and support can greatly increase your chances of successfully quitting smoking.  Nicotine replacement therapy helps deliver nicotine to your body without the negative effects and risks of smoking. Nicotine replacement therapy includes nicotine gum, lozenges, inhalers, nasal sprays, and skin patches. Some may be available over-the-counter and others require a prescription.  Antidepressant medicine helps people abstain from smoking, but how this works is unknown. This medicine is available by prescription.  Nicotinic receptor partial agonist medicine simulates the effect of nicotine in your brain. This medicine is available by prescription. Ask your health care provider for advice about which medicines to use and how to use them based on your health history. Your health care provider will tell you what side effects to look out for if you choose to be on a medicine or therapy. Carefully read the information on the package. Do not use any other product containing nicotine while using a nicotine replacement product.  RELAPSE OR DIFFICULT SITUATIONS Most relapses occur within the first 3 months after quitting. Do not be discouraged if you start smoking again. Remember, most people try several times before finally  quitting. You may have symptoms of withdrawal because your body is used to nicotine. You may crave cigarettes, be irritable, feel very hungry, cough often, get headaches, or have difficulty concentrating. The withdrawal symptoms are only temporary. They are strongest when you first quit, but they will go away within 10-14 days. To reduce the chances of relapse, try to:  Avoid drinking alcohol. Drinking lowers your chances of successfully quitting.  Reduce the amount of caffeine you consume. Once you quit smoking, the amount of caffeine in your body increases and can give you symptoms, such as a rapid heartbeat, sweating, and anxiety.  Avoid smokers because they can make you want to smoke.  Do not let weight gain distract you. Many smokers will gain weight when they quit, usually less than 10 pounds. Eat a healthy diet and stay active. You can always lose the weight gained after you quit.  Find ways to improve your mood other than smoking. FOR MORE INFORMATION  www.smokefree.gov  Document Released: 04/05/2001 Document Revised: 08/26/2013 Document Reviewed: 07/21/2011 Exit[employer] Patient Information 2015  [employer]. This information is not intended to replace advice given to you by your health care provider. Make sure you discuss any questions you have with your health care provider.  Please think about quitting smoking.  This is very important for your health.  There are many things we can do to help you quit.  Let  us know if you are interested.  You can also call 1-800-QUIT-NOW 743-531-6547) for free smoking cessation counseling.

## 2014-12-31 LAB — VITAMIN D 25 HYDROXY (VIT D DEFICIENCY, FRACTURES): VITD: 37.47 ng/mL (ref 30.00–100.00)

## 2015-01-01 ENCOUNTER — Telehealth: Payer: Self-pay | Admitting: Family Medicine

## 2015-01-01 ENCOUNTER — Other Ambulatory Visit: Payer: Self-pay | Admitting: Family Medicine

## 2015-01-01 DIAGNOSIS — E559 Vitamin D deficiency, unspecified: Secondary | ICD-10-CM

## 2015-01-01 NOTE — Telephone Encounter (Signed)
Please call patient, her vitamin D is mildly improved, but still on the lower end of normal. She should continue her vitamin D supplementation, this has been called and per her pharmacy. This is one pill every 7 days. She needs to start her Fosamax, if she has not done so already.

## 2015-01-01 NOTE — Telephone Encounter (Signed)
Patient advised of results and rx instructions.  Pt voiced understanding, no questions at this time.

## 2015-02-03 ENCOUNTER — Ambulatory Visit (INDEPENDENT_AMBULATORY_CARE_PROVIDER_SITE_OTHER): Payer: Medicare Other | Admitting: Family Medicine

## 2015-02-03 ENCOUNTER — Encounter: Payer: Self-pay | Admitting: Family Medicine

## 2015-02-03 ENCOUNTER — Ambulatory Visit (HOSPITAL_BASED_OUTPATIENT_CLINIC_OR_DEPARTMENT_OTHER)
Admission: RE | Admit: 2015-02-03 | Discharge: 2015-02-03 | Disposition: A | Discharge status: Home / Self Care | Payer: Medicare Other | Source: Ambulatory Visit | Attending: Family Medicine | Admitting: Family Medicine

## 2015-02-03 VITALS — BP 136/82 | HR 62 | Temp 98.6°F | Resp 18 | Ht 63.0 in | Wt 196.0 lb

## 2015-02-03 DIAGNOSIS — J449 Chronic obstructive pulmonary disease, unspecified: Secondary | ICD-10-CM | POA: Insufficient documentation

## 2015-02-03 DIAGNOSIS — Z720 Tobacco use: Secondary | ICD-10-CM

## 2015-02-03 DIAGNOSIS — Z716 Tobacco abuse counseling: Secondary | ICD-10-CM

## 2015-02-03 DIAGNOSIS — IMO0001 Reserved for inherently not codable concepts without codable children: Secondary | ICD-10-CM

## 2015-02-03 LAB — CBC WITH DIFFERENTIAL/PLATELET
Basophils Absolute: 0.1 10*3/uL (ref 0.0–0.1)
Basophils Relative: 1 % (ref 0–1)
Eosinophils Absolute: 0.2 10*3/uL (ref 0.0–0.7)
Eosinophils Relative: 2 % (ref 0–5)
HCT: 44.5 % (ref 36.0–46.0)
Hemoglobin: 15 g/dL (ref 12.0–15.0)
Lymphocytes Relative: 33 % (ref 12–46)
Lymphs Abs: 2.7 10*3/uL (ref 0.7–4.0)
MCH: 32.3 pg (ref 26.0–34.0)
MCHC: 33.7 g/dL (ref 30.0–36.0)
MCV: 95.9 fL (ref 78.0–100.0)
MPV: 9.8 fL (ref 8.6–12.4)
Monocytes Absolute: 0.6 10*3/uL (ref 0.1–1.0)
Monocytes Relative: 8 % (ref 3–12)
Neutro Abs: 4.5 10*3/uL (ref 1.7–7.7)
Neutrophils Relative %: 56 % (ref 43–77)
Platelets: 357 10*3/uL (ref 150–400)
RBC: 4.64 MIL/uL (ref 3.87–5.11)
RDW: 13 % (ref 11.5–15.5)
WBC: 8.1 10*3/uL (ref 4.0–10.5)

## 2015-02-03 MED ORDER — AZITHROMYCIN 250 MG PO TABS: ORAL_TABLET | ORAL | Status: DC

## 2015-02-03 MED ORDER — PREDNISONE 50 MG PO TABS: ORAL_TABLET | ORAL | Status: DC

## 2015-02-03 MED ORDER — VARENICLINE TARTRATE 1 MG PO TABS: ORAL_TABLET | ORAL | Status: DC

## 2015-02-03 NOTE — Progress Notes (Signed)
Subjective:    Patient ID: Kendra Ball, female    DOB: 08/23/1945, 69 y.o.   MRN: [id number]  HPI  Nonproductive cough:  Patient presents with a chronic cough. She reports mild increase in fatigue. [facility] and [facility] given [facility]mentin prescription which she states she completed. She denies any fevers or chills. She endorses mild congestion. She denies headache, facial tenderness , ear pain , eye pain , nausea, vomit, diarrhea or rash.  Patient states she has smoked at least 1 pack a cigarettes a day ever since she was 36, giving her 50-pack-year history.  Patient has never been diagnosed with COPD, and has never used inhalers.  Patient is on ACE inhibitor , but reports associated symptoms with her cough.  Past Medical History  Diagnosis Date  . DEPRESSION 08/26/2008  . HYPERTENSION 08/26/2008  . HYPOTHYROIDISM 08/26/2008  . OSTEOPENIA 08/26/2008  . TOBACCO USER 12/25/2009  . Obesity   . Hx of colonic polyps     First noted on  colonoscopy 2012  . Hypercholesteremia    Allergies  Allergen Reactions  . Morphine Sulfate     Swelling around injection area   Social History   Social History  . Marital Status: Married    Spouse Name: N/A  . Number of Children: 3  . Years of Education: N/A   Occupational History  . RETIRED    Social History Main Topics  . Smoking status: Current Every Day Smoker -- 1.00 packs/day for 50 years    Types: Cigarettes  . Smokeless tobacco: Never Used  . Alcohol Use: 0.6 oz/week    1 Glasses of wine per week     Comment: 5-6x/month will have one glass of wine  . Drug Use: No  . Sexual Activity: No   Other Topics Concern  . Not on file   Social History Narrative   Ms. Lapaglia lives in [place] with her husband. She has 3 grown children and several grand children. She has joint custody of 3 of her grand children as her daughter has bipolar disorder and has needed assistance with children in past.    Review of  Systems Negative, with the exception of above mentioned in HPI     Objective:   Physical Exam BP 136/82 mmHg  Pulse 62  Temp(Src) 98.6 F (37 C) (Temporal)  Resp 18  Ht 5\' 3"  (1.6 m)  Wt 196 lb (88.905 kg)  BMI 34.73 kg/m2  SpO2 96% Gen: Afebrile. No acute distress.  Nontoxic in appearance, well-developed, well-nourished, Caucasian female. Very pleasant. HENT: AT. [place]. Bilateral TM visualized and normal in appearance. MMM. Bilateral nares  Without erythema or swelling. Throat without erythema or exudates.  Cough present on exam , no hoarseness  On exam Eyes:Pupils Equal Round Reactive to light, Extraocular movements intact,  Conjunctiva without redness, discharge or icterus. Neck/lymp/endocrine: Supple, no lymphadenopathy,  no thyromegaly CV: RRR,  No edema, +2/4 P posterior tibialis pulses Chest: CTAB, no wheeze or crackles,   Rhonchi present.Diminished air sounds bilaterally Abd: Soft.  round. NTND. BS  present.  no Masses palpated.  Skin:  no rashes, purpura or petechiae.      Assessment & Plan:  1. Encounter for smoking cessation counseling - varenicline (CHANTIX) 1 MG tablet; Take 0.5 mg day 1-3, then take 0.5mg  BID day 4-7, then 1 mg BID  Dispense: 53 tablet; Refill: 0 -   Discussed course, and monitoring of any negative side  effects. Patient experiences any negative side effects she is to stop medication , and be evaluated immediately.  2. COPD bronchitis -  Will obtain chest x-ray today, although feel this is more of a  COPD exacerbation.  Discussed COPD,  Along with smoking history, smoking cessation , COPD progression and treatment. With patient's smoking history , repetitive chronic bronchitis I encouraged her to follow through with  Pulmonary function test /pulmonary referral. Discussed with patient , she will likely be placed on inhalers to help control her COPD. Patient is ready and willing to stop smoking,  Chantix was prescribed to patient today. Will treat as COPD  exacerbation with prednisone burst and azithromycin. - Ambulatory referral to Pulmonology - DG Chest 2 View; Future - CBC w/Diff - predniSONE (DELTASONE) 50 MG tablet; Take 50 mg daily for 5 days.  Dispense: 5 tablet; Refill: 0 - azithromycin (ZITHROMAX Z-PAK) 250 MG tablet; 500 mg day 1, then 250 mg daily until finished  Dispense: 6 each; Refill: 0

## 2015-02-03 NOTE — Progress Notes (Signed)
Pre visit review using our clinic review tool, if applicable. No additional management support is needed unless otherwise documented below in the visit note. 

## 2015-02-03 NOTE — Patient Instructions (Signed)
Chronic Obstructive Pulmonary Disease Chronic obstructive pulmonary disease (COPD) is a common lung condition in which airflow from the lungs is limited. COPD is a general term that can be used to describe many different lung problems that limit airflow, including both chronic bronchitis and emphysema. If you have COPD, your lung function will probably never return to normal, but there are measures you can take to improve lung function and make yourself feel better. CAUSES   Smoking (common).  Exposure to secondhand smoke.  Genetic problems.  Chronic inflammatory lung diseases or recurrent infections. SYMPTOMS  Shortness of breath, especially with physical activity.  Deep, persistent (chronic) cough with a large amount of thick mucus.  Wheezing.  Rapid breaths (tachypnea).  Gray or bluish discoloration (cyanosis) of the skin, especially in your fingers, toes, or lips.  Fatigue.  Weight loss.  Frequent infections or episodes when breathing symptoms become much worse (exacerbations).  Chest tightness. DIAGNOSIS Your health care provider will take a medical history and perform a physical examination to diagnose COPD. Additional tests for COPD may include:  Lung (pulmonary) function tests.  Chest X-ray.  CT scan.  Blood tests. TREATMENT  Treatment for COPD may include:  Inhaler and nebulizer medicines. These help manage the symptoms of COPD and make your breathing more comfortable.  Supplemental oxygen. Supplemental oxygen is only helpful if you have a low oxygen level in your blood.  Exercise and physical activity. These are beneficial for nearly all people with COPD.  Lung surgery or transplant.  Nutrition therapy to gain weight, if you are underweight.  Pulmonary rehabilitation. This may involve working with a team of health care providers and specialists, such as respiratory, occupational, and physical therapists. HOME CARE INSTRUCTIONS  Take all medicines  (inhaled or pills) as directed by your health care provider.  Avoid over-the-counter medicines or cough syrups that dry up your airway (such as antihistamines) and slow down the elimination of secretions unless instructed otherwise by your health care provider.  If you are a smoker, the most important thing that you can do is stop smoking. Continuing to smoke will cause further lung damage and breathing trouble. Ask your health care provider for help with quitting smoking. He or she can direct you to community resources or hospitals that provide support.  Avoid exposure to irritants such as smoke, chemicals, and fumes that aggravate your breathing.  Use oxygen therapy and pulmonary rehabilitation if directed by your health care provider. If you require home oxygen therapy, ask your health care provider whether you should purchase a pulse oximeter to measure your oxygen level at home.  Avoid contact with individuals who have a contagious illness.  Avoid extreme temperature and humidity changes.  Eat healthy foods. Eating smaller, more frequent meals and resting before meals may help you maintain your strength.  Stay active, but balance activity with periods of rest. Exercise and physical activity will help you maintain your ability to do things you want to do.  Preventing infection and hospitalization is very important when you have COPD. Make sure to receive all the vaccines your health care provider recommends, especially the pneumococcal and influenza vaccines. Ask your health care provider whether you need a pneumonia vaccine.  Learn and use relaxation techniques to manage stress.  Learn and use controlled breathing techniques as directed by your health care provider. Controlled breathing techniques include:  Pursed lip breathing. Start by breathing in (inhaling) through your nose for 1 second. Then, purse your lips as if you were   going to whistle and breathe out (exhale) through the  pursed lips for 2 seconds.  Diaphragmatic breathing. Start by putting one hand on your abdomen just above your waist. Inhale slowly through your nose. The hand on your abdomen should move out. Then purse your lips and exhale slowly. You should be able to feel the hand on your abdomen moving in as you exhale.  Learn and use controlled coughing to clear mucus from your lungs. Controlled coughing is a series of short, progressive coughs. The steps of controlled coughing are: 1. Lean your head slightly forward. 2. Breathe in deeply using diaphragmatic breathing. 3. Try to hold your breath for 3 seconds. 4. Keep your mouth slightly open while coughing twice. 5. Spit any mucus out into a tissue. 6. Rest and repeat the steps once or twice as needed. SEEK MEDICAL CARE IF:  You are coughing up more mucus than usual.  There is a change in the color or thickness of your mucus.  Your breathing is more labored than usual.  Your breathing is faster than usual. SEEK IMMEDIATE MEDICAL CARE IF:  You have shortness of breath while you are resting.  You have shortness of breath that prevents you from:  Being able to talk.  Performing your usual physical activities.  You have chest pain lasting longer than 5 minutes.  Your skin color is more cyanotic than usual.  You measure low oxygen saturations for longer than 5 minutes with a pulse oximeter. MAKE SURE YOU:  Understand these instructions.  Will watch your condition.  Will get help right away if you are not doing well or get worse.   This information is not intended to replace advice given to you by your health care provider. Make sure you discuss any questions you have with your health care provider.   Document Released: 01/19/2005 Document Revised: 05/02/2014 Document Reviewed: 12/06/2012 Elsevier Interactive Patient Education 2016 [employer]  

## 2015-02-04 ENCOUNTER — Telehealth: Payer: Self-pay | Admitting: Family Medicine

## 2015-02-04 ENCOUNTER — Encounter: Payer: Self-pay | Admitting: Family Medicine

## 2015-02-04 NOTE — Telephone Encounter (Signed)
Please call patient, no signs of pneumonia on her chest x-ray.

## 2015-02-04 NOTE — Telephone Encounter (Signed)
Patient aware of results.  Pt had no questions at this time.

## 2015-02-13 ENCOUNTER — Encounter: Payer: Self-pay | Admitting: Pulmonary Disease

## 2015-02-13 ENCOUNTER — Ambulatory Visit (INDEPENDENT_AMBULATORY_CARE_PROVIDER_SITE_OTHER): Payer: Medicare Other | Admitting: Pulmonary Disease

## 2015-02-13 VITALS — BP 128/80 | HR 76 | Temp 98.4°F | Ht 64.0 in | Wt 197.8 lb

## 2015-02-13 DIAGNOSIS — F17210 Nicotine dependence, cigarettes, uncomplicated: Secondary | ICD-10-CM | POA: Diagnosis not present

## 2015-02-13 DIAGNOSIS — J449 Chronic obstructive pulmonary disease, unspecified: Secondary | ICD-10-CM | POA: Insufficient documentation

## 2015-02-13 DIAGNOSIS — R911 Solitary pulmonary nodule: Secondary | ICD-10-CM

## 2015-02-13 DIAGNOSIS — J4489 Other specified chronic obstructive pulmonary disease: Secondary | ICD-10-CM

## 2015-02-13 HISTORY — DX: Solitary pulmonary nodule: R91.1

## 2015-02-13 HISTORY — DX: Other specified chronic obstructive pulmonary disease: J44.89

## 2015-02-13 HISTORY — DX: Chronic obstructive pulmonary disease, unspecified: J44.9

## 2015-02-13 MED ORDER — BUDESONIDE-FORMOTEROL FUMARATE 160-4.5 MCG/ACT IN AERO: 2.0000 | INHALATION_SPRAY | Freq: Two times a day (BID) | RESPIRATORY_TRACT | Status: DC

## 2015-02-13 NOTE — Patient Instructions (Signed)
Kendra Ball-- it was great meeting you today...  Substantial improvement in your breathing (and the prevention of worsening function) REQUIRES you to quit smoking...    Continue the Chantix or whatever it takes!!!  Today we did a Spirometry test and an ambulatory oxygen test... We also reviewed your prev CXRs and Low-dose screening CT Chest scans...  We are going to treat your COPD/ chronic bronchitis and this refractory COPD exacerbation with the following >>     Start SYMBICORT 160- 2 sprays twice daily regularly every day...    Add the OTC MUCINEX 600mg  tabs-  One to two tabs twice daily w/ fluids to loosen the thick phlegm & any mucus plugs...  Call for any questions...  Let's plan a follow up visit in 6weeks, sooner if needed for problems.Marland KitchenMarland Kitchen

## 2015-02-13 NOTE — Progress Notes (Signed)
Subjective:     Patient ID: Kendra Ball, female   DOB: 02-Dec-1945, 69 y.o.   MRN: [id number]  HPI  ~  February 13, 2015:  Initial pulmonary consult w/ SN>  38 y/o WF, referred by Richardson, for a pulmonary evaluation>        Kresha complains of SOB/ DOE over the last few yrs (too sedentary, no exercise) but not much baseline cough/ sputum/ etc;  Then about 67moago she was exposed to her sister who was ill & developed a cough, low grade fever, min beige sput, no hemoptysis; she was treated at an urgent care w/ Amox & phen expect w/ codeine cough syrup; [facility] Pred for persistent symptoms & this helped but not totally resolved; CXR reported neg for pneumonia... 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   Smoking Hx>  Current smoker ~1ppd, started age 69 smoked 528yrs up to 1ppd for a 50 pack-yr smoking history...   Pulmonary Hx>  She denies prev hx asthma & has not prev been told about COPD; she denies recurrent bronchitic infections, no hx pneumonia, never had Tb or exposure, etc...   Medical Hx>  Hx HBP, HL, Hypothy, overweight, osteoporosis, VitD defic, anxiety/ depression...  Family Hx>  FamHx is neg for any serious lung diseases; one bro smokes & has COPD/ bronchitis; one sis w/ allergies; Father died at 366from MI...  Occup Hx>  She is retired from sVeterinary surgeonwork; she denies exposure to asbestos or other toxins...  Current Meds>  On Chantix now, ASA81, Lisin10, Simva20, Synthroid100, Fosamax70/wk, VitD50K/wk, Lexapro20, Ativan169m..   EXAM showed Afeb, VSS, O2sat=97%;  Wt=198#, 5'4"Tall, BMI=34;  HEENT- neg, mallampati2;  Chest- decr BS bilat, clear w/o w/r/r;  Heart- RR w/o m/r/g;  Abd- soft, neg;  Ext- VI, w/o c/c/e...   CT Chest 10/08/14 (low-dose scrrening CT) showed norm heart size, calcif LN in post right  hilum, calcif granuloma sup segm LLL (no ch from 09/2013), stable <63m57mUL nodule...   CXR 02/03/15 showed norm heart size, clear lungs, NAD...  Spirometry 02/13/15 showed FVC=2.67 (91%), FEV1=2.05 (91%), %1sec=77, mid-flows wnl at 88%...   Ambulatory oxygen saturation test> O2sat=94% on RA at rest w/ pulse=70/min;  She walked 3 laps w/ lowest O2sat=95% w/ plse=97/min...  Labs in Epic> reviewed & routine labs were wnl...  IMP/PLAN>>  BarRaford Pitchers a signif smoking hx but surprisingly preserved PFTs;  She caught her sisters bronchitic infection (asthmatic bronchitis) & found it hard to resolve requiring 2 antibiotics, Pred taper & cough syrup;  We discussed her smoking & the need to quit completely (she has Chantix); in the meanwhile we decided to start her on SYMBICORT160-2spBid & MUCINEX 600m64mne to two Bid w/ fluids;  Finally we discussed the need for a regular exercise program!    Past Medical History  Diagnosis Date  . DEPRESSION 08/26/2008  . HYPERTENSION 08/26/2008  . HYPOTHYROIDISM 08/26/2008  . OSTEOPENIA 08/26/2008  . TOBACCO USER 12/25/2009  . Obesity   . Hx of colonic polyps     First noted on  colonoscopy 2012  . Hypercholesteremia     Past Surgical History  Procedure Laterality Date  . Gastric bypass    . Cesarean section    . Dilation and curettage of uterus      x2  .  Forearm fracture surgery      right  . Tonsillectomy    . Colon resection N/A 05/09/2014    Procedure: LAPAROSCOPIC HAND-ASSISTED EXTENDED RIGHT COLECTOMY, LAPAROSCOPIC LYSIS OF ADHESIONS, SPLENIC FLEXURE MOBILIZATION.;  Surgeon: [name], MD;  Location: [facility];  Service: General;  Laterality: N/A;    Outpatient Encounter Prescriptions as of 02/13/2015  Medication Sig  . acetaminophen (TYLENOL) 325 MG tablet Take 2 tablets (650 mg total) by mouth every 6 (six) hours as needed for mild pain (or Temp > 100).  [name] alendronate (FOSAMAX) 70 MG tablet   . aspirin 81 MG tablet Take 81 mg by mouth daily.  [name]  azithromycin (ZITHROMAX Z-PAK) 250 MG tablet 500 mg day 1, then 250 mg daily until finished (-Finished)  . Cholecalciferol (VITAMIN D3) 50000 UNITS CAPS Take 1 capsule by mouth every 7 (seven) days.  . Cyanocobalamin (VITAMIN B-12 PO) Take 1 tablet by mouth daily.  [name] escitalopram (LEXAPRO) 20 MG tablet Take 1 tablet (20 mg total) by mouth daily.  . ferrous fumarate (HEMOCYTE - 106 MG FE) 325 (106 FE) MG TABS Take 1 tablet by mouth.  . levothyroxine (SYNTHROID, LEVOTHROID) 100 MCG tablet Take 1 tablet (100 mcg total) by mouth daily before breakfast.  . lisinopril (PRINIVIL,ZESTRIL) 10 MG tablet Take 1 tablet (10 mg total) by mouth daily.  [name] LORazepam (ATIVAN) 1 MG tablet TAKE 1 TABLET BY MOUTH TWICE A DAY AS NEEDED FOR ANXIETY  . Magnesium 250 MG TABS Take 250 mg by mouth 2 (two) times daily.   . Melatonin 3 MG TABS Take 3 mg by mouth at bedtime as needed (for sleep).   . simvastatin (ZOCOR) 10 MG tablet Take 1 tablet (10 mg total) by mouth at bedtime.  . varenicline (CHANTIX) 1 MG tablet Take 0.5 mg day 1-3, then take 0.66m BID day 4-7, then 1 mg BID  . [DISCONTINUED] buPROPion (WELLBUTRIN SR) 200 MG 12 hr tablet Take 1 tablet (200 mg total) by mouth 2 (two) times daily.  . [DISCONTINUED] predniSONE (DELTASONE) 50 MG tablet Take 50 mg daily for 5 days.  (-Finished)    Allergies  Allergen Reactions  . Morphine Sulfate     Swelling around injection area    Immunization History  Administered Date(s) Administered  . Influenza Whole 05/07/2010  . Influenza,inj,Quad PF,36+ Mos 03/13/2014, 12/30/2014  . PPD Test 09/20/2013  . Pneumococcal Conjugate-13 09/10/2014  . Pneumococcal Polysaccharide-23 08/13/2013  . Tdap 08/08/2005    Family History  Problem Relation Age of Onset  . Cancer Mother     colon  . Dementia Mother   . Depression Mother   . Heart disease Father 316 . Heart disease Sister 429   stents, pacemaker  . Depression Sister   . Heart disease Brother 581   CABG, MI  .  Obesity Daughter   . Suicidality Daughter   . Bipolar disorder Daughter   . Obesity Son   . Mental illness Daughter     bipolar  . Obesity Daughter   . Dementia Paternal Aunt     Social History   Social History  . Marital Status: Married    Spouse Name: N/A  . Number of Children: 3  . Years of Education: N/A   Occupational History  . RETIRED    Social History Main Topics  . Smoking status: Current Every Day Smoker -- 1.00 packs/day for 50 years    Types: Cigarettes  . Smokeless tobacco: Never Used  .  Alcohol Use: 0.6 oz/week    1 Glasses of wine per week     Comment: 5-6x/month will have one glass of wine  . Drug Use: No  . Sexual Activity: No   Other Topics Concern  . Not on file   Social History Narrative   Ms. Santiago lives in [place] with her husband. She has 3 grown children and several grand children. She has joint custody of 3 of her grand children as her daughter has bipolar disorder and has needed assistance with children in past.    Current Medications, Allergies, Past Medical History, Past Surgical History, Family History, and Social History were reviewed in [employer] record.   Review of Systems             All symptoms NEG except where BOLDED >>  Constitutional:  F/C/S, fatigue, anorexia, unexpected weight change. HEENT:  HA, visual changes, hearing loss, earache, nasal symptoms, sore throat, mouth sores, hoarseness. Resp:  cough, sputum, hemoptysis; SOB, tightness, wheezing. Cardio:  CP, palpit, DOE, orthopnea, edema. GI:  N/V/D/C, blood in stool; reflux, abd pain, distention, gas. GU:  dysuria, freq, urgency, hematuria, flank pain, voiding difficulty. MS:  joint pain, swelling, tenderness, decr ROM; neck pain, back pain, etc. Neuro:  HA, tremors, seizures, dizziness, syncope, weakness, numbness, gait abn. Skin:  suspicious lesions or skin rash. Heme:  adenopathy, bruising, bleeding. Psyche:  confusion, agitation, sleep  disturbance, hallucinations, anxiety, depression suicidal.   Objective:   Physical Exam       Vital Signs:  Reviewed...  General:  WD, overweight, 69 y/o WF in NAD; alert & oriented; pleasant & cooperative... HEENT:  [place]/AT; Conjunctiva- pink, Sclera- nonicteric, EOM-wnl, PERRLA, EACs-clear, TMs-wnl; NOSE-clear; THROAT-clear & wnl. Neck:  Supple w/ fair ROM; no JVD; normal carotid impulses w/o bruits; no thyromegaly or nodules palpated; no lymphadenopathy. Chest:  Decr BS bilat, clear to P & A; without wheezes, rales, or rhonchi heard. Heart:  Regular Rhythm; norm S1 & S2 without murmurs, rubs, or gallops detected. Abdomen:  Obese, soft & nontender- no guarding or rebound; normal bowel sounds; no organomegaly or masses palpated. Ext:  Decr ROM; without deformities, +arthritic changes; no varicose veins, +venous insuffic, tr edema;  Pulses intact w/o bruits. Neuro:  CNs II-XII intact; motor testing normal; sensory testing normal; gait normal & balance OK. Derm:  No lesions noted; no rash etc. Lymph:  No cervical, supraclavicular, axillary, or inguinal adenopathy palpated.   Assessment:      IMP >>     COPD/ chronic bronchitis w/ acute exac>  This was her 1st episode of asthmatic bronchitis & she's improved after 2 antibiotics, Pred, cough syrup; we will add SYMBICORT160-2spBid & MUCINEX- 1to2 Bid w/ fluids...    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    Cigarette smoker>  She knows the importance of smoking cessation & is using chantix to help...     Medical issues> HBP, HL, Hypothy, overweight, osteoporosis, VitD defic, anxiety/ depression...   PLAN >>      Raford Pitcher has a signif smoking hx but surprisingly preserved PFTs;  She caught her sisters bronchitic infection (asthmatic bronchitis) & found it hard to resolve requiring 2 antibiotics, Pred taper & cough syrup;  We discussed her smoking & the need to quit  completely (she has Chantix); in the meanwhile we decided to start her on SYMBICORT160-2spBid & MUCINEX 639m- one to two  Bid w/ fluids;  Finally we discussed the need for a regular exercise program!     Plan:     Patient's Medications  New Prescriptions   BUDESONIDE-FORMOTEROL (SYMBICORT) 160-4.5 MCG/ACT INHALER    Inhale 2 puffs into the lungs 2 (two) times daily.  Previous Medications   ACETAMINOPHEN (TYLENOL) 325 MG TABLET    Take 2 tablets (650 mg total) by mouth every 6 (six) hours as needed for mild pain (or Temp > 100).   ALENDRONATE (FOSAMAX) 70 MG TABLET       ASPIRIN 81 MG TABLET    Take 81 mg by mouth daily.   AZITHROMYCIN (ZITHROMAX Z-PAK) 250 MG TABLET    500 mg day 1, then 250 mg daily until finished   CHOLECALCIFEROL (VITAMIN D3) 50000 UNITS CAPS    Take 1 capsule by mouth every 7 (seven) days.   CYANOCOBALAMIN (VITAMIN B-12 PO)    Take 1 tablet by mouth daily.   ESCITALOPRAM (LEXAPRO) 20 MG TABLET    Take 1 tablet (20 mg total) by mouth daily.   FERROUS FUMARATE (HEMOCYTE - 106 MG FE) 325 (106 FE) MG TABS    Take 1 tablet by mouth.   LEVOTHYROXINE (SYNTHROID, LEVOTHROID) 100 MCG TABLET    Take 1 tablet (100 mcg total) by mouth daily before breakfast.   LISINOPRIL (PRINIVIL,ZESTRIL) 10 MG TABLET    Take 1 tablet (10 mg total) by mouth daily.   LORAZEPAM (ATIVAN) 1 MG TABLET    TAKE 1 TABLET BY MOUTH TWICE A DAY AS NEEDED FOR ANXIETY   MAGNESIUM 250 MG TABS    Take 250 mg by mouth 2 (two) times daily.    MELATONIN 3 MG TABS    Take 3 mg by mouth at bedtime as needed (for sleep).    SIMVASTATIN (ZOCOR) 10 MG TABLET    Take 1 tablet (10 mg total) by mouth at bedtime.   VARENICLINE (CHANTIX) 1 MG TABLET    Take 0.5 mg day 1-3, then take 0.69m BID day 4-7, then 1 mg BID  Modified Medications   No medications on file  Discontinued Medications   BUPROPION (WELLBUTRIN SR) 200 MG 12 HR TABLET    Take 1 tablet (200 mg total) by mouth 2 (two) times daily.   PREDNISONE (DELTASONE) 50  MG TABLET    Take 50 mg daily for 5 days.

## 2015-03-05 ENCOUNTER — Ambulatory Visit (INDEPENDENT_AMBULATORY_CARE_PROVIDER_SITE_OTHER): Payer: 59 | Admitting: Psychiatry

## 2015-03-05 ENCOUNTER — Encounter (HOSPITAL_COMMUNITY): Payer: Self-pay | Admitting: Psychiatry

## 2015-03-05 VITALS — BP 139/82 | HR 71 | Ht 64.0 in | Wt 202.4 lb

## 2015-03-05 DIAGNOSIS — F331 Major depressive disorder, recurrent, moderate: Secondary | ICD-10-CM

## 2015-03-05 DIAGNOSIS — F17200 Nicotine dependence, unspecified, uncomplicated: Secondary | ICD-10-CM

## 2015-03-05 DIAGNOSIS — F411 Generalized anxiety disorder: Secondary | ICD-10-CM | POA: Diagnosis not present

## 2015-03-05 MED ORDER — BUPROPION HCL ER (SR) 100 MG PO TB12: 100.0000 mg | ORAL_TABLET | Freq: Two times a day (BID) | ORAL | Status: DC

## 2015-03-05 MED ORDER — LORAZEPAM 1 MG PO TABS: ORAL_TABLET | ORAL | Status: DC

## 2015-03-05 MED ORDER — PAROXETINE HCL 20 MG PO TABS: 20.0000 mg | ORAL_TABLET | Freq: Every day | ORAL | Status: DC

## 2015-03-05 NOTE — Progress Notes (Signed)
Patient ID: Kendra Ball, female   DOB: 10/07/1945, 69 y.o.   MRN: [id number]  [place] Progress Note  LIVVY DHALIWAL [id number] 69 y.o.  03/05/2015 2:08 PM  Chief Complaint: "the meds are really working"  History of Present Illness: Pt loves the fall and this time of year. She states energy tends to be low and she sleeps a lot. Motivation decreased but she denies anhedonia.  Pt is taking Chantix 0.5mg  BID and has cut down to 1/2 ppd. Mood has been ok.  Depression is present and mood declines every other week. Denies worthlessness and hopelessness, isolation. She does report some crying spells. Symptoms improve on their own. States her depression "just is".   Sleeping about 8-10 hrs and is napping for several hours during the day. She takes Melatonin and 2 Benadryl  Appetite is variable and she often skips breakast. Concentration is poor.   Anxiety is present but manageable. It is improved as compared to before. She takes Ativan BID most days. When she does get anxious she is unable to identify the triggers.    Pt takes Lexapro, Wellbutrin and Ativan as prescribed and denies SE. States she doesn't know what it is to be without them so is not sure if they are effective.   Suicidal Ideation: No  Plan Formed: No Patient has means to carry out plan: No  Homicidal Ideation: No Plan Formed: No Patient has means to carry out plan: No  Review of Systems: Psychiatric: Agitation: No Hallucination: No Depressed Mood: Yes Insomnia: No Hypersomnia: Yes Altered Concentration: Yes Feels Worthless: No Grandiose Ideas: No Belief In Special Powers: No New/Increased Substance Abuse: No Compulsions: No  Neurologic: Headache: No Seizure: No Paresthesias: No   Review of Systems  Constitutional: Positive for malaise/fatigue. Negative for fever and chills.  HENT: Negative for congestion, ear discharge, nosebleeds and sore throat.   Eyes: Positive for blurred  vision. Negative for double vision, pain and redness.  Respiratory: Negative for cough, shortness of breath and wheezing.   Cardiovascular: Negative for chest pain, palpitations and leg swelling.  Gastrointestinal: Negative for heartburn, nausea, vomiting and abdominal pain.  Musculoskeletal: Positive for back pain and joint pain. Negative for neck pain.  Skin: Negative for itching and rash.  Neurological: Negative for dizziness, tremors, sensory change, seizures, loss of consciousness, weakness and headaches.  Psychiatric/Behavioral: Positive for depression. Negative for suicidal ideas, hallucinations and substance abuse. The patient is nervous/anxious. The patient does not have insomnia.      Past Medical Family, Social History: lives in [place] with her husband. Has 3 kids. Pt retired in 2010. She worked as a Network engineer at [employer].  reports that she has been smoking Cigarettes.  She has a 25 pack-year smoking history. She has never used smokeless tobacco. She reports that she drinks about 0.6 oz of alcohol per week. She reports that she does not use illicit drugs.  Family History  Problem Relation Age of Onset  . Cancer Mother     colon  . Dementia Mother   . Depression Mother   . Heart disease Father 71  . Heart disease Sister 67    stents, pacemaker  . Depression Sister   . Heart disease Brother 33    CABG, MI  . Obesity Daughter   . Suicidality Daughter   . Bipolar disorder Daughter   . Obesity Son   . Mental illness Daughter     bipolar  . Obesity Daughter   .  Dementia Paternal Aunt    Past Medical History  Diagnosis Date  . DEPRESSION 08/26/2008  . HYPERTENSION 08/26/2008  . HYPOTHYROIDISM 08/26/2008  . OSTEOPENIA 08/26/2008  . TOBACCO USER 12/25/2009  . Obesity   . Hx of colonic polyps     First noted on  colonoscopy 2012  . Hypercholesteremia     Outpatient Encounter Prescriptions as of 03/05/2015  Medication Sig  . acetaminophen (TYLENOL) 325 MG  tablet Take 2 tablets (650 mg total) by mouth every 6 (six) hours as needed for mild pain (or Temp > 100).  [name] alendronate (FOSAMAX) 70 MG tablet   . aspirin 81 MG tablet Take 81 mg by mouth daily.  [name] azithromycin (ZITHROMAX Z-PAK) 250 MG tablet 500 mg day 1, then 250 mg daily until finished  . budesonide-formoterol (SYMBICORT) 160-4.5 MCG/ACT inhaler Inhale 2 puffs into the lungs 2 (two) times daily.  . Cholecalciferol (VITAMIN D3) 50000 UNITS CAPS Take 1 capsule by mouth every 7 (seven) days.  . Cyanocobalamin (VITAMIN B-12 PO) Take 1 tablet by mouth daily.  [name] escitalopram (LEXAPRO) 20 MG tablet Take 1 tablet (20 mg total) by mouth daily.  . ferrous fumarate (HEMOCYTE - 106 MG FE) 325 (106 FE) MG TABS Take 1 tablet by mouth.  . levothyroxine (SYNTHROID, LEVOTHROID) 100 MCG tablet Take 1 tablet (100 mcg total) by mouth daily before breakfast.  . lisinopril (PRINIVIL,ZESTRIL) 10 MG tablet Take 1 tablet (10 mg total) by mouth daily.  [name] LORazepam (ATIVAN) 1 MG tablet TAKE 1 TABLET BY MOUTH TWICE A DAY AS NEEDED FOR ANXIETY  . Magnesium 250 MG TABS Take 250 mg by mouth 2 (two) times daily.   . Melatonin 3 MG TABS Take 3 mg by mouth at bedtime as needed (for sleep).   . simvastatin (ZOCOR) 10 MG tablet Take 1 tablet (10 mg total) by mouth at bedtime.  . varenicline (CHANTIX) 1 MG tablet Take 0.5 mg day 1-3, then take 0.5mg  BID day 4-7, then 1 mg BID   No facility-administered encounter medications on file as of 03/05/2015.    Past Psychiatric History/Hospitalization(s): Anxiety: Yes Bipolar Disorder: No Depression: Yes Mania: No Psychosis: No Schizophrenia: No Personality Disorder: No Hospitalization for psychiatric illness: No History of Electroconvulsive Shock Therapy: No Prior Suicide Attempts: No  Physical Exam: Constitutional:  BP 139/82 mmHg  Pulse 71  Ht 5\' 4"  (1.626 m)  Wt 202 lb 6.4 oz (91.808 kg)  BMI 34.72 kg/m2  General Appearance: alert, oriented, no acute  distress  Musculoskeletal: Strength & Muscle Tone: within normal limits Gait & Station: normal Patient leans: N/A  Mental Status Examination/Evaluation: Objective: Attitude: Calm and cooperative  Appearance: Fairly Groomed, appears to be stated age  Eye Contact::  Good  Speech:  Clear and Coherent and Normal Rate  Volume:  Normal  Mood:  depressed  Affect:  Congruent  Thought Process:  Linear and Logical  Orientation:  Full (Time, Place, and Person)  Thought Content:  WDL  Suicidal Thoughts:  No  Homicidal Thoughts:  No  Judgement:  Fair  Insight:  Fair  Concentration: good  Memory: Immediate-good Recent-good Remote-good  Recall: fair  Language: fair  Gait and Station: normal  [employer] of Knowledge: average  Psychomotor Activity:  Normal  Akathisia:  No  Handed:  Right  AIMS (if indicated): n/a  Assets:  Armed forces logistics/support/administrative officer Desire for Improvement Financial Resources/Insurance Housing Intimacy Leisure Time Resilience Social Support English as a second language teacher (Choose  Three): Review of Psycho-Social Stressors (1), Review or order clinical lab tests (1), Established Problem, Worsening (2), Review of Medication Regimen & Side Effects (2) and Review of New Medication or Change in Dosage (2)  Assessment: AXIS I MDD- recurrent, moderate. GAD. Nicotine dependence  AXIS II Deferred        Treatment Plan/Recommendations:  Plan of Care:  Medication management with supportive therapy. Risks/benefits and SE of the medication discussed. Pt verbalized understanding and verbal consent obtained for treatment.  Affirm with the patient that the medications are taken as ordered. Patient expressed understanding of how their medications were to be used.     Laboratory: Reviewed labs 02/03/2015 CBC WNL  Psychotherapy: Therapy: brief supportive therapy provided. Discussed psychosocial stressors in detail.    -discussed smoking cessation. Smoking cessation instruction/counseling given: counseled patient on the dangers of tobacco use, advised patient to stop smoking, and reviewed strategies to maximize success  -reviewed sleep hygiene in detail    Medications:  D/c Lexapro  Start trial of Paxil 20mg  po qD for depression and anxiety Wellbutrin SR to 200mg  po BID for depression Continue Ativan 1mg  po BID prn anxiety   Pt is taking Benadryl and Melatonin OCT for sleep. Recommend pt stop Benadryl.   Routine PRN Medications: Yes  Consultations: encouraged to f/up with PCP as needed, referred to new individual therapist at pt's request.   Safety Concerns: Pt denies SI and is at an acute low risk for suicide.Patient told to call clinic if any problems occur. Patient advised to go to ER if they should develop SI/HI, side effects, or if symptoms worsen. Has crisis numbers to call if needed. Pt verbalized understanding.   Other: F/up in 2 months or sooner if needed     [name], MD 03/05/2015

## 2015-03-11 ENCOUNTER — Other Ambulatory Visit: Payer: Self-pay | Admitting: *Deleted

## 2015-03-11 DIAGNOSIS — Z716 Tobacco abuse counseling: Secondary | ICD-10-CM

## 2015-03-11 MED ORDER — VARENICLINE TARTRATE 1 MG PO TABS: 1.0000 mg | ORAL_TABLET | Freq: Two times a day (BID) | ORAL | Status: DC

## 2015-03-11 NOTE — Telephone Encounter (Signed)
chantix refilled per Dr Raoul Pitch

## 2015-03-13 ENCOUNTER — Ambulatory Visit: Payer: Medicare Other | Admitting: Family Medicine

## 2015-03-26 ENCOUNTER — Ambulatory Visit: Payer: Medicare Other | Admitting: Pulmonary Disease

## 2015-03-26 ENCOUNTER — Encounter: Payer: Self-pay | Admitting: Pulmonary Disease

## 2015-03-26 VITALS — BP 124/70 | HR 76 | Temp 97.9°F | Resp 16 | Wt 202.0 lb

## 2015-03-26 DIAGNOSIS — F329 Major depressive disorder, single episode, unspecified: Secondary | ICD-10-CM

## 2015-03-26 DIAGNOSIS — R911 Solitary pulmonary nodule: Secondary | ICD-10-CM

## 2015-03-26 DIAGNOSIS — F32A Depression, unspecified: Secondary | ICD-10-CM

## 2015-03-26 DIAGNOSIS — F17210 Nicotine dependence, cigarettes, uncomplicated: Secondary | ICD-10-CM

## 2015-03-26 DIAGNOSIS — J449 Chronic obstructive pulmonary disease, unspecified: Secondary | ICD-10-CM

## 2015-03-26 NOTE — Progress Notes (Signed)
Subjective:     Patient ID: Kendra Ball, female   DOB: February 12, 1946, 69 y.o.   MRN: [id number]  HPI   ~  February 13, 2015:  Initial pulmonary consult w/ SN>  33 y/o WF, referred by [facility], for a pulmonary evaluation>        Consepcion complains of SOB/ DOE over the last few yrs (too sedentary, no exercise) but not much baseline cough/ sputum/ etc;  Then about 74mo ago she was exposed to her sister who was ill & developed a cough, low grade fever, min beige sput, no hemoptysis; she was treated at an urgent care w/ Amox & phen expect w/ codeine cough syrup; when she ret to [facility] gave her a Zpak & Pred for persistent symptoms & this helped but not totally resolved; CXR reported neg for pneumonia... 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   Smoking Hx>  Current smoker ~1ppd, started age 54, smoked 74 yrs up to 1ppd for a 50 pack-yr smoking history...   Pulmonary Hx>  She denies prev hx asthma & has not prev been told about COPD; she denies recurrent bronchitic infections, no hx pneumonia, never had Tb or exposure, etc...   Medical Hx>  Hx HBP, HL, Hypothy, overweight, osteoporosis, VitD defic, anxiety/ depression...  Family Hx>  FamHx is neg for any serious lung diseases; one bro smokes & has COPD/ bronchitis; one sis w/ allergies; Father died at 61 from MI...  Occup Hx>  She is retired from Veterinary surgeon work; she denies exposure to asbestos or other toxins...  Current Meds>  On Chantix now, ASA81, Lisin10, Simva20, Synthroid100, Fosamax70/wk, VitD50K/wk, Lexapro20, Ativan1mg ...  EXAM showed Afeb, VSS, O2sat=97%;  Wt=198#, 5'4"Tall, BMI=34;  HEENT- neg, mallampati2;  Chest- decr BS bilat, clear w/o w/r/r;  Heart- RR w/o m/r/g;  Abd- soft, neg;  Ext- VI, w/o c/c/e...   CT Chest 10/08/14 (low-dose scrrening CT) showed norm heart size, calcif LN in post right  hilum, calcif granuloma sup segm LLL (no ch from 09/2013), stable <67mm LUL nodule...   CXR 02/03/15 showed norm heart size, clear lungs, NAD...  Spirometry 02/13/15 showed FVC=2.67 (91%), FEV1=2.05 (91%), %1sec=77, mid-flows wnl at 88%...   Ambulatory oxygen saturation test> O2sat=94% on RA at rest w/ pulse=70/min;  She walked 3 laps w/ lowest O2sat=95% w/ plse=97/min...  Labs in Epic> reviewed & routine labs were wnl... IMP/PLAN>>  Raford Pitcher has a signif smoking hx but surprisingly preserved PFTs;  She caught her sisters bronchitic infection (asthmatic bronchitis) & found it hard to resolve requiring 2 antibiotics, Pred taper & cough syrup;  We discussed her smoking & the need to quit completely (she has Chantix); in the meanwhile we decided to start her on [place] 600mg - one to two Bid w/ fluids;  Finally we discussed the need for a regular exercise program!  ~  March 25, 2015:  72mo ROV w/ SN>         Past Medical History  Diagnosis Date  . DEPRESSION 08/26/2008  . HYPERTENSION 08/26/2008  . HYPOTHYROIDISM 08/26/2008  . OSTEOPENIA 08/26/2008  . TOBACCO USER 12/25/2009  . Obesity   . Hx of colonic polyps     First noted on  colonoscopy 2012  . Hypercholesteremia     Past Surgical History  Procedure Laterality Date  . Gastric bypass    . Cesarean section    .  Dilation and curettage of uterus      x2  . Forearm fracture surgery      right  . Tonsillectomy    . Colon resection N/A 05/09/2014    Procedure: LAPAROSCOPIC HAND-ASSISTED EXTENDED RIGHT COLECTOMY, LAPAROSCOPIC LYSIS OF ADHESIONS, SPLENIC FLEXURE MOBILIZATION.;  Surgeon: [name], MD;  Location: [facility] ORS;  Service: General;  Laterality: N/A;    Outpatient Encounter Prescriptions as of 03/26/2015  Medication Sig  . acetaminophen (TYLENOL) 325 MG tablet Take 2 tablets (650 mg total) by mouth every 6 (six) hours as needed for mild pain (or Temp > 100).  [name] alendronate (FOSAMAX) 70 MG tablet   . aspirin 81  MG tablet Take 81 mg by mouth daily.  . budesonide-formoterol (SYMBICORT) 160-4.5 MCG/ACT inhaler Inhale 2 puffs into the lungs 2 (two) times daily.  [name] buPROPion (WELLBUTRIN SR) 100 MG 12 hr tablet Take 1 tablet (100 mg total) by mouth 2 (two) times daily.  . Cholecalciferol (VITAMIN D3) 50000 UNITS CAPS Take 1 capsule by mouth every 7 (seven) days.  . Cyanocobalamin (VITAMIN B-12 PO) Take 1 tablet by mouth daily.  . diphenhydrAMINE (SOMINEX) 25 MG tablet Take 50 mg by mouth at bedtime as needed for sleep.  . ferrous fumarate (HEMOCYTE - 106 MG FE) 325 (106 FE) MG TABS Take 1 tablet by mouth.  . levothyroxine (SYNTHROID, LEVOTHROID) 100 MCG tablet Take 1 tablet (100 mcg total) by mouth daily before breakfast.  . lisinopril (PRINIVIL,ZESTRIL) 10 MG tablet Take 1 tablet (10 mg total) by mouth daily.  [name] LORazepam (ATIVAN) 1 MG tablet TAKE 1 TABLET BY MOUTH TWICE A DAY AS NEEDED FOR ANXIETY  . Magnesium 250 MG TABS Take 250 mg by mouth 2 (two) times daily.   . Melatonin 3 MG TABS Take 3 mg by mouth at bedtime as needed (for sleep).   [name] PARoxetine (PAXIL) 20 MG tablet Take 1 tablet (20 mg total) by mouth daily.  . simvastatin (ZOCOR) 10 MG tablet Take 1 tablet (10 mg total) by mouth at bedtime.  . [DISCONTINUED] azithromycin (ZITHROMAX Z-PAK) 250 MG tablet 500 mg day 1, then 250 mg daily until finished (Patient not taking: Reported on 03/26/2015)  . [DISCONTINUED] varenicline (CHANTIX) 1 MG tablet Take 1 tablet (1 mg total) by mouth 2 (two) times daily. Take 0.5 mg day 1-3, then take 0.12m BID day 4-7, then 1 mg BID (Patient not taking: Reported on 03/26/2015)   No facility-administered encounter medications on file as of 03/26/2015.    Allergies  Allergen Reactions  . Morphine Sulfate     Swelling around injection area    Immunization History  Administered Date(s) Administered  . Influenza Whole 05/07/2010  . Influenza,inj,Quad PF,36+ Mos 03/13/2014, 12/30/2014  . PPD Test 09/20/2013  .  Pneumococcal Conjugate-13 09/10/2014  . Pneumococcal Polysaccharide-23 08/13/2013  . Tdap 08/08/2005    Family History  Problem Relation Age of Onset  . Cancer Mother     colon  . Dementia Mother   . Depression Mother   . Heart disease Father 319 . Heart disease Sister 481   stents, pacemaker  . Depression Sister   . Heart disease Brother 581   CABG, MI  . Obesity Daughter   . Suicidality Daughter   . Bipolar disorder Daughter   . Obesity Son   . Mental illness Daughter     bipolar  . Obesity Daughter   . Dementia Paternal Aunt     Social History  Social History  . Marital Status: Married    Spouse Name: N/A  . Number of Children: 3  . Years of Education: N/A   Occupational History  . RETIRED    Social History Main Topics  . Smoking status: Current Every Day Smoker -- 0.50 packs/day for 50 years    Types: Cigarettes  . Smokeless tobacco: Never Used  . Alcohol Use: 0.6 oz/week    1 Glasses of wine per week     Comment: 5-6x/month will have one glass of wine  . Drug Use: No  . Sexual Activity: No   Other Topics Concern  . Not on file   Social History Narrative   Ms. Dolinski lives in [place] with her husband. She has 3 grown children and several grand children. She has joint custody of 3 of her grand children as her daughter has bipolar disorder and has needed assistance with children in past.    Current Medications, Allergies, Past Medical History, Past Surgical History, Family History, and Social History were reviewed in [employer] record.   Review of Systems             All symptoms NEG except where BOLDED >>  Constitutional:  F/C/S, fatigue, anorexia, unexpected weight change. HEENT:  HA, visual changes, hearing loss, earache, nasal symptoms, sore throat, mouth sores, hoarseness. Resp:  cough, sputum, hemoptysis; SOB, tightness, wheezing. Cardio:  CP, palpit, DOE, orthopnea, edema. GI:  N/V/D/C, blood in stool; reflux, abd  pain, distention, gas. GU:  dysuria, freq, urgency, hematuria, flank pain, voiding difficulty. MS:  joint pain, swelling, tenderness, decr ROM; neck pain, back pain, etc. Neuro:  HA, tremors, seizures, dizziness, syncope, weakness, numbness, gait abn. Skin:  suspicious lesions or skin rash. Heme:  adenopathy, bruising, bleeding. Psyche:  confusion, agitation, sleep disturbance, hallucinations, anxiety, depression suicidal.   Objective:   Physical Exam       Vital Signs:  Reviewed...  General:  WD, overweight, 69 y/o WF in NAD; alert & oriented; pleasant & cooperative... HEENT:  /AT; Conjunctiva- pink, Sclera- nonicteric, EOM-wnl, PERRLA, EACs-clear, TMs-wnl; NOSE-clear; THROAT-clear & wnl. Neck:  Supple w/ fair ROM; no JVD; normal carotid impulses w/o bruits; no thyromegaly or nodules palpated; no lymphadenopathy. Chest:  Decr BS bilat, clear to P & A; without wheezes, rales, or rhonchi heard. Heart:  Regular Rhythm; norm S1 & S2 without murmurs, rubs, or gallops detected. Abdomen:  Obese, soft & nontender- no guarding or rebound; normal bowel sounds; no organomegaly or masses palpated. Ext:  Decr ROM; without deformities, +arthritic changes; no varicose veins, +venous insuffic, tr edema;  Pulses intact w/o bruits. Neuro:  CNs II-XII intact; motor testing normal; sensory testing normal; gait normal & balance OK. Derm:  No lesions noted; no rash etc. Lymph:  No cervical, supraclavicular, axillary, or inguinal adenopathy palpated.   Assessment:      IMP >>     COPD/ chronic bronchitis w/ acute exac>  This was her 1st episode of asthmatic bronchitis & she's improved after 2 antibiotics, Pred, cough syrup; we will add SYMBICORT160-2spBid & MUCINEX- 1to2 Bid w/ fluids...    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    Cigarette smoker>  She knows the importance of smoking cessation & is using chantix to help...      Medical issues> HBP, HL, Hypothy, overweight, osteoporosis, VitD defic, anxiety/ depression...   PLAN >>  Raford Pitcher has a signif smoking hx but surprisingly preserved PFTs;  She caught her sisters bronchitic infection (asthmatic bronchitis) & found it hard to resolve requiring 2 antibiotics, Pred taper & cough syrup;  We discussed her smoking & the need to quit completely (she has Chantix); in the meanwhile we decided to start her on SYMBICORT160-2spBid & MUCINEX 658m- one to two Bid w/ fluids;  Finally we discussed the need for a regular exercise program!     Plan:     Patient's Medications  New Prescriptions   No medications on file  Previous Medications   ACETAMINOPHEN (TYLENOL) 325 MG TABLET    Take 2 tablets (650 mg total) by mouth every 6 (six) hours as needed for mild pain (or Temp > 100).   ALENDRONATE (FOSAMAX) 70 MG TABLET       ASPIRIN 81 MG TABLET    Take 81 mg by mouth daily.   BUDESONIDE-FORMOTEROL (SYMBICORT) 160-4.5 MCG/ACT INHALER    Inhale 2 puffs into the lungs 2 (two) times daily.   BUPROPION (WELLBUTRIN SR) 100 MG 12 HR TABLET    Take 1 tablet (100 mg total) by mouth 2 (two) times daily.   CHOLECALCIFEROL (VITAMIN D3) 50000 UNITS CAPS    Take 1 capsule by mouth every 7 (seven) days.   CYANOCOBALAMIN (VITAMIN B-12 PO)    Take 1 tablet by mouth daily.   DIPHENHYDRAMINE (SOMINEX) 25 MG TABLET    Take 50 mg by mouth at bedtime as needed for sleep.   FERROUS FUMARATE (HEMOCYTE - 106 MG FE) 325 (106 FE) MG TABS    Take 1 tablet by mouth.   LEVOTHYROXINE (SYNTHROID, LEVOTHROID) 100 MCG TABLET    Take 1 tablet (100 mcg total) by mouth daily before breakfast.   LISINOPRIL (PRINIVIL,ZESTRIL) 10 MG TABLET    Take 1 tablet (10 mg total) by mouth daily.   LORAZEPAM (ATIVAN) 1 MG TABLET    TAKE 1 TABLET BY MOUTH TWICE A DAY AS NEEDED FOR ANXIETY   MAGNESIUM 250 MG TABS    Take 250 mg by mouth 2 (two) times daily.    MELATONIN 3 MG TABS    Take 3 mg by mouth at bedtime as needed (for  sleep).    PAROXETINE (PAXIL) 20 MG TABLET    Take 1 tablet (20 mg total) by mouth daily.   SIMVASTATIN (ZOCOR) 10 MG TABLET    Take 1 tablet (10 mg total) by mouth at bedtime.  Modified Medications   No medications on file  Discontinued Medications   AZITHROMYCIN (ZITHROMAX Z-PAK) 250 MG TABLET    500 mg day 1, then 250 mg daily until finished   VARENICLINE (CHANTIX) 1 MG TABLET    Take 1 tablet (1 mg total) by mouth 2 (two) times daily. Take 0.5 mg day 1-3, then take 0.59mBID day 4-7, then 1 mg BID

## 2015-03-26 NOTE — Patient Instructions (Signed)
Today we updated your med list in our EPIC system...    Continue your current medications the same...  Keep up the good work on smoking cessation, Chantix, diet & exercise...  Call for any questions...  Let's plan a follow up visit in 3-24months, sooner if needed for problems.Marland KitchenMarland Kitchen

## 2015-03-27 ENCOUNTER — Ambulatory Visit: Payer: Self-pay | Admitting: Pulmonary Disease

## 2015-04-02 ENCOUNTER — Other Ambulatory Visit: Payer: Self-pay | Admitting: *Deleted

## 2015-04-02 DIAGNOSIS — E785 Hyperlipidemia, unspecified: Secondary | ICD-10-CM

## 2015-04-02 MED ORDER — SIMVASTATIN 10 MG PO TABS: 10.0000 mg | ORAL_TABLET | Freq: Every day | ORAL | Status: DC

## 2015-04-02 NOTE — Telephone Encounter (Signed)
Refilled patient simvastatin per refill protocol. Patient notified

## 2015-04-13 ENCOUNTER — Ambulatory Visit (HOSPITAL_COMMUNITY): Payer: Self-pay | Admitting: Clinical

## 2015-04-16 ENCOUNTER — Other Ambulatory Visit: Payer: Self-pay | Admitting: *Deleted

## 2015-04-16 DIAGNOSIS — I10 Essential (primary) hypertension: Secondary | ICD-10-CM

## 2015-04-16 DIAGNOSIS — E039 Hypothyroidism, unspecified: Secondary | ICD-10-CM

## 2015-04-16 MED ORDER — LISINOPRIL 10 MG PO TABS: 10.0000 mg | ORAL_TABLET | Freq: Every day | ORAL | Status: DC

## 2015-04-16 MED ORDER — LEVOTHYROXINE SODIUM 100 MCG PO TABS: 100.0000 ug | ORAL_TABLET | Freq: Every day | ORAL | Status: DC

## 2015-05-05 ENCOUNTER — Encounter (HOSPITAL_COMMUNITY): Payer: Self-pay | Admitting: Psychiatry

## 2015-05-05 ENCOUNTER — Ambulatory Visit (INDEPENDENT_AMBULATORY_CARE_PROVIDER_SITE_OTHER): Payer: 59 | Admitting: Psychiatry

## 2015-05-05 VITALS — BP 142/78 | HR 85 | Ht 64.5 in | Wt 200.6 lb

## 2015-05-05 DIAGNOSIS — G4700 Insomnia, unspecified: Secondary | ICD-10-CM

## 2015-05-05 DIAGNOSIS — F411 Generalized anxiety disorder: Secondary | ICD-10-CM | POA: Diagnosis not present

## 2015-05-05 DIAGNOSIS — F331 Major depressive disorder, recurrent, moderate: Secondary | ICD-10-CM

## 2015-05-05 HISTORY — DX: Insomnia, unspecified: G47.00

## 2015-05-05 MED ORDER — BUPROPION HCL ER (SR) 200 MG PO TB12: 200.0000 mg | ORAL_TABLET | Freq: Two times a day (BID) | ORAL | Status: DC

## 2015-05-05 MED ORDER — VENLAFAXINE HCL ER 150 MG PO CP24: 150.0000 mg | ORAL_CAPSULE | Freq: Every day | ORAL | Status: DC

## 2015-05-05 MED ORDER — ALPRAZOLAM 0.5 MG PO TABS: ORAL_TABLET | ORAL | Status: DC

## 2015-05-05 NOTE — Progress Notes (Signed)
[facility] Progress Note  Kendra Ball [id number] 69 y.o.  05/05/2015 2:29 PM  Chief Complaint: "I am drinking more"  History of Present Illness: States she has been drinking up to 3 drinks per night. This has gone up from one drink per night. States it makes her feel tired so she can sleep. Her mind is racing and Ativan is not helping. Anxiety is present and getting worse. It is improved as compared to before. She takes Ativan BID most days and it doesn't seem to have any effect. She tried1.5 tabs but it was not better. When she does get anxious she is unable to identify the triggers. States she does better when out with others.   Pt is no longer taking Chantix.  Depression is present and getting worse. Reports worthlessness and hopelessness, isolation, crying spells, poor hygiene, low motivation and anhedonia. Distractions, like reading, helps her feel better. Pt is spending her time reading and sleeping. Notes she went back up on Wellbutrin 200mg  BID because it helps. Not sure but Paxil does not seam to be helping.    Sleeping about 3-4 hrs and is napping for several hours during the day. She takes Melatonin and 2 Benadryl  Appetite is variable and she often skips breakast. Concentration is poor. Energy is very low.  Pt takes Paxil, Wellbutrin and Ativan as prescribed and denies SE. States she doesn't know what it is to be without them so is not sure if they are effective.   Suicidal Ideation: No  Plan Formed: No Patient has means to carry out plan: No  Homicidal Ideation: No Plan Formed: No Patient has means to carry out plan: No  Review of Systems: Psychiatric: Agitation: No Hallucination: No Depressed Mood: Yes Insomnia: Yes Hypersomnia: Yes Altered Concentration: Yes Feels Worthless: Yes Grandiose Ideas: No Belief In Special Powers: No New/Increased Substance Abuse: No Compulsions: No  Neurologic: Headache: No Seizure: No Paresthesias: No    Review of Systems  Constitutional: Positive for malaise/fatigue. Negative for fever and chills.  HENT: Negative for congestion, ear discharge, nosebleeds and sore throat.   Eyes: Positive for blurred vision. Negative for double vision, pain and redness.  Respiratory: Negative for cough, shortness of breath and wheezing.   Cardiovascular: Negative for chest pain, palpitations and leg swelling.  Gastrointestinal: Negative for heartburn, nausea, vomiting and abdominal pain.  Musculoskeletal: Positive for back pain and joint pain. Negative for neck pain.  Skin: Negative for itching and rash.  Neurological: Positive for tremors. Negative for dizziness, sensory change, seizures, loss of consciousness, weakness and headaches.  Psychiatric/Behavioral: Positive for depression. Negative for suicidal ideas, hallucinations and substance abuse. The patient is nervous/anxious and has insomnia.      Past Medical Family, Social History: lives in [place] with her husband. Has 3 kids. Pt retired in 2010. She worked as a Network engineer at [employer].  reports that she has been smoking Cigarettes.  She has a 25 pack-year smoking history. She has never used smokeless tobacco. She reports that she drinks about 0.6 oz of alcohol per week. She reports that she does not use illicit drugs.  Family History  Problem Relation Age of Onset  . Cancer Mother     colon  . Dementia Mother   . Depression Mother   . Heart disease Father 30  . Heart disease Sister 24    stents, pacemaker  . Depression Sister   . Heart disease Brother 77    CABG, MI  .  Obesity Daughter   . Suicidality Daughter   . Bipolar disorder Daughter   . Obesity Son   . Mental illness Daughter     bipolar  . Obesity Daughter   . Dementia Paternal Aunt    Past Medical History  Diagnosis Date  . DEPRESSION 08/26/2008  . HYPERTENSION 08/26/2008  . HYPOTHYROIDISM 08/26/2008  . OSTEOPENIA 08/26/2008  . TOBACCO USER 12/25/2009  . Obesity    . Hx of colonic polyps     First noted on  colonoscopy 2012  . Hypercholesteremia     Outpatient Encounter Prescriptions as of 05/05/2015  Medication Sig  . acetaminophen (TYLENOL) 325 MG tablet Take 2 tablets (650 mg total) by mouth every 6 (six) hours as needed for mild pain (or Temp > 100).  [name] alendronate (FOSAMAX) 70 MG tablet   . aspirin 81 MG tablet Take 81 mg by mouth daily.  . budesonide-formoterol (SYMBICORT) 160-4.5 MCG/ACT inhaler Inhale 2 puffs into the lungs 2 (two) times daily.  [name] buPROPion (WELLBUTRIN SR) 100 MG 12 hr tablet Take 1 tablet (100 mg total) by mouth 2 (two) times daily.  . Cholecalciferol (VITAMIN D3) 50000 UNITS CAPS Take 1 capsule by mouth every 7 (seven) days.  . Cyanocobalamin (VITAMIN B-12 PO) Take 1 tablet by mouth daily.  . diphenhydrAMINE (SOMINEX) 25 MG tablet Take 50 mg by mouth at bedtime as needed for sleep.  . ferrous fumarate (HEMOCYTE - 106 MG FE) 325 (106 FE) MG TABS Take 1 tablet by mouth.  . levothyroxine (SYNTHROID, LEVOTHROID) 100 MCG tablet Take 1 tablet (100 mcg total) by mouth daily before breakfast.  . lisinopril (PRINIVIL,ZESTRIL) 10 MG tablet Take 1 tablet (10 mg total) by mouth daily.  [name] LORazepam (ATIVAN) 1 MG tablet TAKE 1 TABLET BY MOUTH TWICE A DAY AS NEEDED FOR ANXIETY  . Magnesium 250 MG TABS Take 250 mg by mouth 2 (two) times daily.   . Melatonin 3 MG TABS Take 3 mg by mouth at bedtime as needed (for sleep).   [name] PARoxetine (PAXIL) 20 MG tablet Take 1 tablet (20 mg total) by mouth daily.  . simvastatin (ZOCOR) 10 MG tablet Take 1 tablet (10 mg total) by mouth at bedtime.   No facility-administered encounter medications on file as of 05/05/2015.    Past Psychiatric History/Hospitalization(s): Anxiety: Yes Bipolar Disorder: No Depression: Yes Mania: No Psychosis: No Schizophrenia: No Personality Disorder: No Hospitalization for psychiatric illness: No History of Electroconvulsive Shock Therapy: No Prior Suicide Attempts:  No  Physical Exam: Constitutional:  BP 142/78 mmHg  Pulse 85  Ht 5' 4.5" (1.638 m)  Wt 200 lb 9.6 oz (90.992 kg)  BMI 33.91 kg/m2  General Appearance: alert, oriented, no acute distress  Musculoskeletal: Strength & Muscle Tone: within normal limits Gait & Station: normal Patient leans: N/A  Mental Status Examination/Evaluation: Objective: Attitude: Calm and cooperative  Appearance: Fairly Groomed, appears to be stated age  Eye Contact::  Good  Speech:  Clear and Coherent and Normal Rate  Volume:  Normal  Mood:  depressed  Affect:  Depressed and Tearful  Thought Process:  Linear and Logical  Orientation:  Full (Time, Place, and Person)  Thought Content:  WDL  Suicidal Thoughts:  No  Homicidal Thoughts:  No  Judgement:  Fair  Insight:  Fair  Concentration: good  Memory: Immediate-good Recent-good Remote-good  Recall: fair  Language: fair  Gait and Station: normal  [employer] of Knowledge: average  Psychomotor Activity:  Tremor  Akathisia:  No  Handed:  Right  AIMS (if indicated): n/a  Assets:  Communication Skills Desire for Improvement Financial Resources/Insurance Housing Intimacy Leisure Time Resilience Social Support English as a second language teacher (Choose Three): Review of Psycho-Social Stressors (1), Established Problem, Worsening (2), Review of Medication Regimen & Side Effects (2) and Review of New Medication or Change in Dosage (2)  Assessment: AXIS I MDD- recurrent, moderate. GAD. Insomnia; Nicotine dependence  AXIS II Deferred        Treatment Plan/Recommendations:  Plan of Care:  Medication management with supportive therapy. Risks/benefits and SE of the medication discussed. Pt verbalized understanding and verbal consent obtained for treatment.  Affirm with the patient that the medications are taken as ordered. Patient expressed understanding of how their medications were to be  used.     Laboratory: Reviewed labs 02/03/2015 CBC WNL  Psychotherapy: Therapy: brief supportive therapy provided. Discussed psychosocial stressors in detail.  - recommend pt stop all alcohol use  -discussed smoking cessation. Smoking cessation instruction/counseling given: counseled patient on the dangers of tobacco use, advised patient to stop smoking, and reviewed strategies to maximize success  -reviewed sleep hygiene in detail    Medications:  D/c Paxil Start trial of Effexor XR 150mg  po qD for depression and anxiety Increase Wellbutrin SR to 200mg  po BID for depression  D/c Ativan   Start trial of Xanax 0.5mg  po BID and 1mg  po qHS prn anxiety   Pt is taking Benadryl and Melatonin OCT for sleep. Recommend pt stop Benadryl.   Routine PRN Medications: Yes  Consultations: encouraged to f/up with PCP as needed, referred to new individual therapist at pt's request.   Safety Concerns: Pt denies SI and is at an acute low risk for suicide.Patient told to call clinic if any problems occur. Patient advised to go to ER if they should develop SI/HI, side effects, or if symptoms worsen. Has crisis numbers to call if needed. Pt verbalized understanding.   Other: F/up in 6 weeks or sooner if needed     [name], MD 05/05/2015

## 2015-05-14 ENCOUNTER — Other Ambulatory Visit: Payer: Self-pay | Admitting: *Deleted

## 2015-05-14 DIAGNOSIS — I10 Essential (primary) hypertension: Secondary | ICD-10-CM

## 2015-05-14 DIAGNOSIS — E039 Hypothyroidism, unspecified: Secondary | ICD-10-CM

## 2015-05-14 MED ORDER — LEVOTHYROXINE SODIUM 100 MCG PO TABS: 100.0000 ug | ORAL_TABLET | Freq: Every day | ORAL | Status: DC

## 2015-05-14 MED ORDER — LISINOPRIL 10 MG PO TABS: 10.0000 mg | ORAL_TABLET | Freq: Every day | ORAL | Status: DC

## 2015-05-20 ENCOUNTER — Ambulatory Visit (INDEPENDENT_AMBULATORY_CARE_PROVIDER_SITE_OTHER): Payer: 59 | Admitting: Clinical

## 2015-05-20 ENCOUNTER — Encounter (HOSPITAL_COMMUNITY): Payer: Self-pay | Admitting: Clinical

## 2015-05-20 DIAGNOSIS — F331 Major depressive disorder, recurrent, moderate: Secondary | ICD-10-CM

## 2015-05-20 DIAGNOSIS — F411 Generalized anxiety disorder: Secondary | ICD-10-CM

## 2015-05-23 NOTE — Progress Notes (Signed)
Comprehensive Clinical Assessment (CCA) Note  05/23/2015 Kendra Ball [id number]  Visit Diagnosis:      ICD-9-CM ICD-10-CM   1. MDD (major depressive disorder), recurrent episode, moderate (HCC) 296.32 F33.1   2. GAD (generalized anxiety disorder) 300.02 F41.1       CCA Part One  Part One has been completed on paper by the patient.  (See scanned document in Chart Review)  CCA Part Two A  Intake/Chief Complaint:  CCA Intake With Chief Complaint CCA Part Two Date: 05/20/15 CCA Part Two Time: 0900 Chief Complaint/Presenting Problem: Depression and Anxiety Patients Currently Reported Symptoms/Problems: Depression and anxiety Individual's Strengths: "I am a good person." Individual's Preferences: "I want to feel happy." Type of Services Patient Feels Are Needed: Individual therapy  Mental Health Symptoms Depression:  Depression: Change in energy/activity, Difficulty Concentrating, Fatigue, Hopelessness, Increase/decrease in appetite, Sleep (too much or little), Worthlessness  Mania:  Mania: N/A (Denies mania and is aware of symptoms)  Anxiety:   Anxiety: Difficulty concentrating, Fatigue, Irritability, Restlessness, Sleep, Tension, Worrying (No Panic)  Psychosis:  Psychosis: N/A  Trauma:     Obsessions:  Obsessions: N/A  Compulsions:  Compulsions: N/A  Inattention:  Inattention: N/A  Hyperactivity/Impulsivity:  Hyperactivity/Impulsivity: N/A  Oppositional/Defiant Behaviors:  Oppositional/Defiant Behaviors: N/A  Borderline Personality:  Emotional Irregularity: N/A  Other Mood/Personality Symptoms:      Mental Status Exam Appearance and self-care  Stature:  Stature: Average  Weight:  Weight: Thin  Clothing:  Clothing: Casual  Grooming:  Grooming: Normal  Cosmetic use:  Cosmetic Use: None  Posture/gait:  Posture/Gait: Normal  Motor activity:  Motor Activity: Not Remarkable  Sensorium  Attention:  Attention: Normal  Concentration:  Concentration: Normal  Orientation:   Orientation: X5  Recall/memory:  Recall/Memory: Normal  Affect and Mood  Affect:  Affect: Appropriate  Mood:  Mood: Depressed  Relating  Eye contact:  Eye Contact: Normal  Facial expression:  Facial Expression: Responsive  Attitude toward examiner:  Attitude Toward Examiner: Cooperative  Thought and Language  Speech flow: Speech Flow: Normal  Thought content:  Thought Content: Appropriate to mood and circumstances  Preoccupation:     Hallucinations:     Organization:     Transport planner of Knowledge:  Fund of Knowledge: Average  Intelligence:  Intelligence: Average  Abstraction:  Abstraction: Normal  Judgement:  Judgement: Fair  Art therapist:  Reality Testing: Realistic  Insight:  Insight: Fair  Decision Making:     Social Functioning  Social Maturity:  Social Maturity: Isolates  Social Judgement:  Social Judgement: Normal  Stress  Stressors:  Stressors: Family conflict  Coping Ability:  Coping Ability: Exhausted, English as a second language teacher Deficits:     Supports:      Family and Psychosocial History: Family history Marital status: Married Number of Years Married: 34 What types of issues is patient dealing with in the relationship?: We do not interact much - "There is no violence, fighting, there is no nothing. " I think he is depressed.[name] He is good hearted. He is not affectionate, I just get lonely.  I think he loves me the very best he can.  Are you sexually active?: No What is your sexual orientation?: Heterosexual Has your sexual activity been affected by drugs, alcohol, medication, or emotional stress?: No Does patient have children?: Yes How many children?: 3 How is patient's relationship with their children?: Grown children - Arlana Pouch (no communication with him and his family), and Heather 74 in June - bipolar with 3  kids.   Childhood History:  Childhood History By whom was/is the patient raised?: Both parents, Other (Comment) (Father died 6 and then had  wonderful step Father) Additional childhood history information: there were 5 kids, sometimes 7 of [place]. Growing up really was wonderful and my mother was the most awesome. She didn't say she loed [place], but we all knew we were loved. She showed it. Description of patient's relationship with caregiver when they were a child: Relationship with all 3 of my parents was good.  Patient's description of current relationship with people who raised him/her: All passed How were you disciplined when you got in trouble as a child/adolescent?: With Father - was hard "he guilted me." Mom would spank [place]. Does patient have siblings?: Yes Number of Siblings: 5 Description of patient's current relationship with siblings: 5 regular and two step.  I have wonderful close relationship with my siblings. My one step brother died of AIDS and other step brother I don't see as often. Did patient suffer any verbal/emotional/physical/sexual abuse as a child?: No Did patient suffer from severe childhood neglect?: No Has patient ever been sexually abused/assaulted/raped as an adolescent or adult?: No Was the patient ever a victim of a crime or a disaster?: No Witnessed domestic violence?: No Has patient been effected by domestic violence as an adult?: No  CCA Part Two B  Employment/Work Situation: Employment / Work Copywriter, 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 been impacted by current illness: No ("I did my work and did my job. 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 Has patient ever been in the [employer]?: No Has patient ever served in combat?: No Are There Guns or Other Weapons in [place]?: No  Education: Education Last Grade Completed: 12 Name of [place]: [employer]   Did Teacher, adult education From [employer]?: Yes Did Physicist, medical?: Yes What Type of College Degree Do you Have?: Medical assisting school - certificate Did [place]?: No Did You Have An Individualized Education Program (IIEP): No Did You Have Any Difficulty At School?: No  Religion: Religion/Spirituality Are You A Religious Person?: Yes What is Your Religious Affiliation?: Protestant How Might This Affect Treatment?: "I am not sure."  Leisure/Recreation: Leisure / Recreation Leisure and Hobbies: "Jig saw puzzles, read, and during the summer I like to camp at [employer] in [place].   Exercise/Diet: Exercise/Diet Do You Exercise?: No Do You Follow a Special Diet?: Yes Type of Diet: a lot of protien Do You Have Any Trouble Sleeping?: No  CCA Part Two C  Alcohol/Drug Use: Alcohol / Drug Use History of alcohol / drug use?: No history of alcohol / drug abuse                      CCA Part Three  ASAM's:  Six Dimensions of Multidimensional Assessment  Dimension 1:  Acute Intoxication and/or Withdrawal Potential:     Dimension 2:  Biomedical Conditions and Complications:     Dimension 3:  Emotional, Behavioral, or Cognitive Conditions and Complications:     Dimension 4:  Readiness to Change:     Dimension 5:  Relapse, Continued use, or Continued Problem Potential:     Dimension 6:  Recovery/Living  Environment:      Substance use Disorder (SUD)    Social Function:  Social Functioning Social Maturity: Isolates Social Judgement: Normal  Stress:  Stress Stressors: Family conflict Coping Ability: Exhausted, Overwhelmed Patient Takes Medications The Way The Doctor Instructed?: Yes Priority Risk: Low Acuity  Risk Assessment- Self-Harm Potential: Risk Assessment For Self-Harm Potential Thoughts of Self-Harm: No current thoughts Method: No plan Availability of Means: No access/NA  Risk Assessment -Dangerous to Others Potential: Risk  Assessment For Dangerous to Others Potential Method: No Plan Availability of Means: No access or NA Intent: Vague intent or NA Notification Required: No need or identified person  DSM5 Diagnoses: Patient Active Problem List   Diagnosis Date Noted  . Insomnia 05/05/2015  . COPD (chronic obstructive pulmonary disease) with chronic bronchitis ([place]) 02/13/2015  . Pulmonary nodule seen on imaging study 02/13/2015  . Encounter for smoking cessation counseling 12/30/2014  . Need for influenza vaccination 12/30/2014  . Stress reaction 11/11/2014  . Osteoporosis 11/11/2014  . Breast cancer screening 09/10/2014  . History of gastric bypass 09/10/2014  . Essential hypertension 05/22/2014  . GAD (generalized anxiety disorder) 01/30/2014  . Major depressive disorder, recurrent episode, moderate ([place]) 01/30/2014  . Cigarette nicotine dependence without complication [id number]  . Dyslipidemia 10/03/2013  . Abnormal mammogram 10/01/2013  . H/O gastric bypass 08/13/2013  . BMI 34.0-34.9,adult 08/13/2013  . Preventative health care 08/13/2013  . Skin lesion of face 08/13/2013  . Family history of premature coronary heart disease 08/13/2013  . Hypothyroidism 08/26/2008  . Depression 08/26/2008  . HYPERTENSION 08/26/2008  . Osteopenia 08/26/2008    Patient Centered Plan: Patient is on the following Treatment Plan(s):  Treatment plan to be formulated at next session Individual therapy 1x every 1-2 weeks, sessions to be less frequent as symptoms improve, follow safety plan as needed Recommendations for Services/Supports/Treatments:    Treatment Plan Summary:    Referrals to Alternative Service(s): Referred to Alternative Service(s):   Place:   Date:   Time:    Referred to Alternative Service(s):   Place:   Date:   Time:    Referred to Alternative Service(s):   Place:   Date:   Time:    Referred to Alternative Service(s):   Place:   Date:   Time:     Antwian Santaana A

## 2015-06-16 ENCOUNTER — Encounter (HOSPITAL_COMMUNITY): Payer: Self-pay | Admitting: Psychiatry

## 2015-06-16 ENCOUNTER — Ambulatory Visit (INDEPENDENT_AMBULATORY_CARE_PROVIDER_SITE_OTHER): Payer: 59 | Admitting: Psychiatry

## 2015-06-16 VITALS — BP 124/77 | HR 73 | Ht 64.0 in | Wt 201.4 lb

## 2015-06-16 DIAGNOSIS — G4700 Insomnia, unspecified: Secondary | ICD-10-CM

## 2015-06-16 DIAGNOSIS — F411 Generalized anxiety disorder: Secondary | ICD-10-CM

## 2015-06-16 DIAGNOSIS — F331 Major depressive disorder, recurrent, moderate: Secondary | ICD-10-CM | POA: Diagnosis not present

## 2015-06-16 MED ORDER — BUPROPION HCL ER (SR) 200 MG PO TB12: 200.0000 mg | ORAL_TABLET | Freq: Two times a day (BID) | ORAL | Status: DC

## 2015-06-16 MED ORDER — ALPRAZOLAM 0.5 MG PO TABS: ORAL_TABLET | ORAL | Status: DC

## 2015-06-16 MED ORDER — VENLAFAXINE HCL ER 75 MG PO CP24: 225.0000 mg | ORAL_CAPSULE | Freq: Every day | ORAL | Status: DC

## 2015-06-16 NOTE — Progress Notes (Signed)
Patient ID: Kendra Ball, female   DOB: 03-Nov-1945, 70 y.o.   MRN: [id number]  [facility] Progress Note  Kendra Ball [id number] 70 y.o.  06/16/2015 3:28 PM  Chief Complaint: "I quit drinking 4 weeks ago"  History of Present Illness: States she had been drinking up to 6 drinks per nigh about one month ago. That has gone up from 3 drink per night. States it makes her feel tired so she can sleep. Her mind is racing. Notes that she visited with her sisters last week and they helped a lot.   Pt is taking Xanax 2 BID and 2 tabs qHS. Pt is sleeping better and getting about 9 hrs of good sleep. She rarely naps during the day. Appetite is increased. Energy is fair most days.   Anxiety is present and getting a little better since stopping alcohol use. Prior to that she easily overwhelmed and very indecisive. States she does better when out with others. Xanax helps her compartmentalize the things she needs to do. Notes she gets "panicky" when having to talk to loved ones. States she avoid answering phones.   Depression is present and manageable with current meds. Reports worthlessness and hopelessness, isolation, crying spells and anhedonia. Distractions, like reading, helps her feel better. Pt is spending her time reading. No longer having poor hygiene and motivation has improved.   Pt takes Effexor XR, Wellbutrin and Xanax as prescribed and denies SE.   Pt is in therapy.   Suicidal Ideation: No  Plan Formed: No Patient has means to carry out plan: No  Homicidal Ideation: No Plan Formed: No Patient has means to carry out plan: No  Review of Systems: Psychiatric: Agitation: No Hallucination: No Depressed Mood: Yes Insomnia: Yes Hypersomnia: No Altered Concentration: Yes Feels Worthless: Yes Grandiose Ideas: No Belief In Special Powers: No New/Increased Substance Abuse: No Compulsions: No  Neurologic: Headache: No Seizure: No Paresthesias: No   Review  of Systems  Constitutional: Negative for fever, chills and malaise/fatigue.  HENT: Negative for congestion, ear discharge, nosebleeds and sore throat.   Eyes: Positive for blurred vision. Negative for double vision, pain and redness.  Respiratory: Negative for cough, shortness of breath and wheezing.   Cardiovascular: Negative for chest pain, palpitations and leg swelling.  Gastrointestinal: Negative for heartburn, nausea, vomiting and abdominal pain.  Musculoskeletal: Positive for back pain and joint pain. Negative for neck pain.  Skin: Negative for itching and rash.  Neurological: Positive for tremors. Negative for dizziness, sensory change, seizures, loss of consciousness, weakness and headaches.  Psychiatric/Behavioral: Positive for depression. Negative for suicidal ideas, hallucinations and substance abuse. The patient is nervous/anxious and has insomnia.      Past Medical Family, Social History: lives in [place] with her husband. Has 3 kids. Pt retired in 2010. She worked as a Network engineer at [employer].  reports that she has been smoking Cigarettes.  She has a 50 pack-year smoking history. She has never used smokeless tobacco. She reports that she drinks alcohol. She reports that she does not use illicit drugs.  Family History  Problem Relation Age of Onset  . Cancer Mother     colon  . Dementia Mother   . Depression Mother   . Heart disease Father 16  . Heart disease Sister 52    stents, pacemaker  . Depression Sister   . Heart disease Brother 4    CABG, MI  . Depression Brother   . Obesity Daughter   .  Suicidality Daughter   . Bipolar disorder Daughter   . Obesity Son   . Mental illness Daughter     bipolar  . Obesity Daughter   . Dementia Paternal Aunt   . Depression Sister    Past Medical History  Diagnosis Date  . DEPRESSION 08/26/2008  . HYPERTENSION 08/26/2008  . HYPOTHYROIDISM 08/26/2008  . OSTEOPENIA 08/26/2008  . TOBACCO USER 12/25/2009  . Obesity    . Hx of colonic polyps     First noted on  colonoscopy 2012  . Hypercholesteremia     Outpatient Encounter Prescriptions as of 06/16/2015  Medication Sig  . acetaminophen (TYLENOL) 325 MG tablet Take 2 tablets (650 mg total) by mouth every 6 (six) hours as needed for mild pain (or Temp > 100).  [name] alendronate (FOSAMAX) 70 MG tablet   . ALPRAZolam (XANAX) 0.5 MG tablet Take one tab po qAM and noon. Take 1mg  (2 tabs) po qHS prn insomnia  . aspirin 81 MG tablet Take 81 mg by mouth daily.  . budesonide-formoterol (SYMBICORT) 160-4.5 MCG/ACT inhaler Inhale 2 puffs into the lungs 2 (two) times daily.  [name] buPROPion (WELLBUTRIN SR) 200 MG 12 hr tablet Take 1 tablet (200 mg total) by mouth 2 (two) times daily.  . Cholecalciferol (VITAMIN D3) 50000 UNITS CAPS Take 1 capsule by mouth every 7 (seven) days.  . Cyanocobalamin (VITAMIN B-12 PO) Take 1 tablet by mouth daily.  . ferrous fumarate (HEMOCYTE - 106 MG FE) 325 (106 FE) MG TABS Take 1 tablet by mouth.  . levothyroxine (SYNTHROID, LEVOTHROID) 100 MCG tablet Take 1 tablet (100 mcg total) by mouth daily before breakfast.  . lisinopril (PRINIVIL,ZESTRIL) 10 MG tablet Take 1 tablet (10 mg total) by mouth daily.  . Magnesium 250 MG TABS Take 250 mg by mouth 2 (two) times daily.   . simvastatin (ZOCOR) 10 MG tablet Take 1 tablet (10 mg total) by mouth at bedtime.  [name] venlafaxine XR (EFFEXOR XR) 150 MG 24 hr capsule Take 1 capsule (150 mg total) by mouth daily with breakfast.  . Melatonin 3 MG TABS Take 3 mg by mouth at bedtime as needed (for sleep). Reported on 06/16/2015   No facility-administered encounter medications on file as of 06/16/2015.    Past Psychiatric History/Hospitalization(s): Anxiety: Yes Bipolar Disorder: No Depression: Yes Mania: No Psychosis: No Schizophrenia: No Personality Disorder: No Hospitalization for psychiatric illness: No History of Electroconvulsive Shock Therapy: No Prior Suicide Attempts: No  Physical  Exam: Constitutional:  BP 124/77 mmHg  Pulse 73  Ht 5\' 4"  (1.626 m)  Wt 201 lb 6.4 oz (91.354 kg)  BMI 34.55 kg/m2  General Appearance: alert, oriented, no acute distress  Musculoskeletal: Strength & Muscle Tone: within normal limits Gait & Station: normal Patient leans: straight  Mental Status Examination/Evaluation: Objective: Attitude: Calm and cooperative  Appearance: Fairly Groomed, appears to be stated age  Eye Contact::  Good  Speech:  Clear and Coherent and Normal Rate  Volume:  Normal  Mood:  Depressed and anxious  Affect:  Congruent  Thought Process:  Linear and Logical  Orientation:  Full (Time, Place, and Person)  Thought Content:  WDL  Suicidal Thoughts:  No  Homicidal Thoughts:  No  Judgement:  Fair  Insight:  Fair  Concentration: good  Memory: Immediate-good Recent-good Remote-good  Recall: fair  Language: fair  Gait and Station: normal  [employer] of Knowledge: average  Psychomotor Activity:  Tremor  Akathisia:  No  Handed:  Right  AIMS (if  indicated): n/a  Assets:  Communication Skills Desire for Improvement Financial Resources/Insurance Housing Intimacy Leisure Time Resilience Social Support English as a second language teacher (Choose Three): Established Problem, Stable/Improving (1), Review of Psycho-Social Stressors (1), Established Problem, Worsening (2), Review of Medication Regimen & Side Effects (2) and Review of New Medication or Change in Dosage (2)  Assessment: AXIS I MDD- recurrent, moderate. GAD. Insomnia; Nicotine dependence  AXIS II Deferred        Treatment Plan/Recommendations:  Plan of Care:  Medication management with supportive therapy. Risks/benefits and SE of the medication discussed. Pt verbalized understanding and verbal consent obtained for treatment.  Affirm with the patient that the medications are taken as ordered. Patient expressed understanding of  how their medications were to be used.     Laboratory: Reviewed labs 02/03/2015 CBC WNL  Psychotherapy: Therapy: brief supportive therapy provided. Discussed psychosocial stressors in detail.  - recommend pt stop all alcohol use  -discussed smoking cessation. Smoking cessation instruction/counseling given: counseled patient on the dangers of tobacco use, advised patient to stop smoking, and reviewed strategies to maximize success  -reviewed sleep hygiene in detail    Medications:   increase Effexor XR to 225mg  po qD for depression and anxiety Wellbutrin SR to 200mg  po BID for depression   Xanax 0.5mg  po BID and 1mg  po qHS prn anxiety     Routine PRN Medications: Yes  Consultations: encouraged to f/up with PCP as needed, encouraged to start individual therapy  Safety Concerns: Pt denies SI and is at an acute low risk for suicide.Patient told to call clinic if any problems occur. Patient advised to go to ER if they should develop SI/HI, side effects, or if symptoms worsen. Has crisis numbers to call if needed. Pt verbalized understanding.   Other: F/up in 8 weeks or sooner if needed     [name], MD 06/16/2015

## 2015-06-17 ENCOUNTER — Encounter (HOSPITAL_COMMUNITY): Payer: Self-pay | Admitting: Clinical

## 2015-06-17 ENCOUNTER — Ambulatory Visit (INDEPENDENT_AMBULATORY_CARE_PROVIDER_SITE_OTHER): Payer: 59 | Admitting: Clinical

## 2015-06-17 DIAGNOSIS — F331 Major depressive disorder, recurrent, moderate: Secondary | ICD-10-CM

## 2015-06-17 DIAGNOSIS — F411 Generalized anxiety disorder: Secondary | ICD-10-CM

## 2015-06-17 NOTE — Progress Notes (Signed)
   THERAPIST PROGRESS NOTE  Session Time: 2:30 - 3:29  Participation Level: Active  Behavioral Response: CasualAlertAnxious and Depressed  Type of Therapy: Individual Therapy  Treatment Goals addressed: Improve psychiatric symptoms, Elevate mood,. Improve unhelpful thought patterns, decrease irrational worries and fears   Interventions: CBT and Motivational Interviewing, Grounding and Mindfulness Techniques, psychoeducation  Summary: Kendra Ball is a 70 y.o. female who presents with Major Depressive Disorder, recurrent, moderate and Generalized Anxiety Disorder  Suicidal/Homicidal: No -without intent/plan  Therapist Response:  Pamala Hurry met with clinician for an individual session. [name] discussed her psychiatric symptoms, her current life events and her goals for therapy. [name] shared that she has been experiencing  A lot of anxiety and depression lately. She shared  about some of her life challenges and her negative thoughts. Clinician introduced cbt and discussed some of the basic concepts. Client and clinician discussed how our thoughts influence our emotions and actions (and visa versa) . Clinician introduced a depression packet ( cbt based). Clinician explained how to complete the packet. [name] shared that she would complete the packet before next session and to bring it back next session.Clinician introduced grounding and mindfulness techniques. Clinician explained the process purpose and practice of the techniques. Client and clinician practiced one of the ground techniques together. [name] shared that she was really excited about practicing it until next session Clinician also introduced the idea of keeping a gratitude journal. Clinician explained the process and purpose. [name] agreed to keep the journal and report back about it next session.    Plan: Return again in 1 -2 weeks.  Diagnosis: Axis I: Major Depressive Disorder, recurrent, moderate and Generalized Anxiety  Disorder   [name] A, LCSW 06/17/2015

## 2015-06-27 ENCOUNTER — Other Ambulatory Visit (HOSPITAL_COMMUNITY): Payer: Self-pay | Admitting: Psychiatry

## 2015-07-01 ENCOUNTER — Ambulatory Visit (INDEPENDENT_AMBULATORY_CARE_PROVIDER_SITE_OTHER): Payer: 59 | Admitting: Clinical

## 2015-07-01 ENCOUNTER — Other Ambulatory Visit: Payer: Self-pay

## 2015-07-01 ENCOUNTER — Encounter (HOSPITAL_COMMUNITY): Payer: Self-pay | Admitting: Clinical

## 2015-07-01 DIAGNOSIS — F331 Major depressive disorder, recurrent, moderate: Secondary | ICD-10-CM

## 2015-07-01 DIAGNOSIS — F411 Generalized anxiety disorder: Secondary | ICD-10-CM | POA: Diagnosis not present

## 2015-07-01 DIAGNOSIS — E785 Hyperlipidemia, unspecified: Secondary | ICD-10-CM

## 2015-07-01 MED ORDER — SIMVASTATIN 10 MG PO TABS: 10.0000 mg | ORAL_TABLET | Freq: Every day | ORAL | Status: DC

## 2015-07-01 NOTE — Progress Notes (Signed)
   THERAPIST PROGRESS NOTE  Session Time: 2:30 - 3:25  Participation Level: Active  Behavioral Response: NeatAlertDepressed  Type of Therapy: Individual Therapy  Treatment Goals addressed: Improve psychiatric symptoms, Elevate mood (increase activity) Improve unhelpful thought patterns, decrease irrational worries and fears,  Interventions: CBT and Motivational Interviewing, Grounding and Mindfulness Techniques, psychoeducation  Summary: ABERDEEN HAFEN is a 70 y.o. female who presents with Major Depressive Disorder, recurrent, moderate and Generalized Anxiety Disorder  Suicidal/Homicidal: No -without intent/plan  Therapist Response:  Pamala Hurry met with clinician for an individual session. Siri discussed her psychiatric symptoms,  her current life events and her homework. Damyah shared that she was really glad about the grounding and mindfulness techniques. She shared that she was finding them helpful in interrupting her thoughts and making her more present. Tamaiya completed her homework packet on depression. Client and clinician reviewed and discussed it. Vergene shared her thoughts and insights from her homework. She also shared briefly about keeping a gratitude journal. Charish shared that she was noticing her negative thoughts were about herself and her difficulty getting things done. Client and clinician discussed her negative thoughts and the beliefs she holds about herself. Client and clinician discussed black and white thinking. Client and clinician discussed how she could approach tasks with the idea of progress rather than perfection. Sharai shared she always has too much to do. Clinician suggest she map it on a schedule to see if her thoughts lined up with reality. Aneli had the insight that she often gets anxious about an event because her mind tells her she can only do one event a day. Clinician encouraged her to keep a schedule and bring it back with her to next session.  Wynetta agreed to complete another homework packet (depression #3) and continue her other homework as well.    Plan: Return again in 1 -2 weeks.  Diagnosis: Axis I: Major Depressive Disorder, recurrent, moderate and Generalized Anxiety Disorder    Katlynne Mckercher A, LCSW 07/01/2015

## 2015-07-13 ENCOUNTER — Other Ambulatory Visit: Payer: Self-pay | Admitting: *Deleted

## 2015-07-13 DIAGNOSIS — E039 Hypothyroidism, unspecified: Secondary | ICD-10-CM

## 2015-07-13 DIAGNOSIS — I10 Essential (primary) hypertension: Secondary | ICD-10-CM

## 2015-07-13 MED ORDER — LEVOTHYROXINE SODIUM 100 MCG PO TABS: 100.0000 ug | ORAL_TABLET | Freq: Every day | ORAL | Status: DC

## 2015-07-13 MED ORDER — LISINOPRIL 10 MG PO TABS: 10.0000 mg | ORAL_TABLET | Freq: Every day | ORAL | Status: DC

## 2015-07-14 ENCOUNTER — Other Ambulatory Visit: Payer: Self-pay | Admitting: Acute Care

## 2015-07-14 DIAGNOSIS — F17210 Nicotine dependence, cigarettes, uncomplicated: Secondary | ICD-10-CM

## 2015-07-15 ENCOUNTER — Encounter (HOSPITAL_COMMUNITY): Payer: Self-pay | Admitting: Clinical

## 2015-07-15 ENCOUNTER — Ambulatory Visit (INDEPENDENT_AMBULATORY_CARE_PROVIDER_SITE_OTHER): Payer: 59 | Admitting: Clinical

## 2015-07-15 DIAGNOSIS — F411 Generalized anxiety disorder: Secondary | ICD-10-CM

## 2015-07-15 DIAGNOSIS — F331 Major depressive disorder, recurrent, moderate: Secondary | ICD-10-CM | POA: Diagnosis not present

## 2015-07-15 NOTE — Progress Notes (Signed)
   THERAPIST PROGRESS NOTE  Session Time: 2:30 -3:28  Participation Level: Active  Behavioral Response: CasualAlertDepressed  Type of Therapy: Individual Therapy  Treatment Goals addressed: Improve psychiatric symptoms, Elevate mood (increase activity, increase social interaction) Improve unhelpful thought patterns,   Interventions: CBT and Motivational Interviewing, Grounding and Mindfulness Techniques, psychoeducation  Summary: SHANAIYA BENE is a 70 y.o. female who presents with Major Depressive Disorder, recurrent, moderate and Generalized Anxiety Disorder  Suicidal/Homicidal: No -without intent/plan  Therapist Response:  Pamala Hurry met with clinician for an individual session. Dmya discussed her psychiatric symptoms, and her current life events. Galia shared that she had some good days and also had some difficult days in which she drank more than she would have liked. Client and clinician discussed how alcohol affects her psychiatric symptoms She shared that her good days happened when she was busy and making progress and her bad days she didn't get anything done and felt very bad about it. She shared that she had completed packet depression #3.Client and clinician reviewed and discussed her homework and its application to her and her symptoms. She shared that she has continued to keep her gratitude journal though she was not consistent with it.  She shared about a negative belief about herself that shows up often she shared that she believes she is lazy which makes her feel depressed. Client and clinician discussed the evidence for and against this belief. Clinician asked open ended questions and Shemiah shared about several ways she was actually very productive. Client and clinician discussed healthier alternative ways to view herself such as "I am a productive person who sometimes behaves lazy. " Client and clinician discussed the difference between identity and behavior. Clinician  asked what kinds of things would she like to do if she viewed herself differently. Nichole shared she would go back to chair yoga, do some traveling, do mixed media art, and get dressed every day and go outside. Client and clinician reviewed her grounding and mindfulness techniques. Clinician introduced another one. Jream agreed to continue to practice her techniques as well as complete Depression packet #4 and bring it back with her next session.    Plan: Return again in 1 -2 weeks.  Diagnosis: Axis I: Major Depressive Disorder, recurrent, moderate and Generalized Anxiety Disorder    Breely Panik A, LCSW 07/15/2015

## 2015-07-29 ENCOUNTER — Ambulatory Visit (HOSPITAL_COMMUNITY): Payer: Self-pay | Admitting: Clinical

## 2015-07-31 ENCOUNTER — Other Ambulatory Visit (HOSPITAL_COMMUNITY): Payer: Self-pay | Admitting: Psychiatry

## 2015-08-11 ENCOUNTER — Ambulatory Visit (HOSPITAL_COMMUNITY): Payer: Self-pay | Admitting: Clinical

## 2015-08-18 ENCOUNTER — Encounter (HOSPITAL_COMMUNITY): Payer: Self-pay | Admitting: Psychiatry

## 2015-08-18 ENCOUNTER — Ambulatory Visit (INDEPENDENT_AMBULATORY_CARE_PROVIDER_SITE_OTHER): Payer: 59 | Admitting: Psychiatry

## 2015-08-18 VITALS — BP 130/80 | HR 76 | Ht 63.0 in | Wt 202.8 lb

## 2015-08-18 DIAGNOSIS — F411 Generalized anxiety disorder: Secondary | ICD-10-CM | POA: Diagnosis not present

## 2015-08-18 DIAGNOSIS — G4700 Insomnia, unspecified: Secondary | ICD-10-CM

## 2015-08-18 DIAGNOSIS — F331 Major depressive disorder, recurrent, moderate: Secondary | ICD-10-CM | POA: Diagnosis not present

## 2015-08-18 MED ORDER — ALPRAZOLAM 0.5 MG PO TABS: ORAL_TABLET | ORAL | Status: DC

## 2015-08-18 MED ORDER — VENLAFAXINE HCL ER 75 MG PO CP24: 225.0000 mg | ORAL_CAPSULE | Freq: Every day | ORAL | Status: DC

## 2015-08-18 MED ORDER — BUPROPION HCL ER (SR) 200 MG PO TB12: 200.0000 mg | ORAL_TABLET | Freq: Two times a day (BID) | ORAL | Status: DC

## 2015-08-18 NOTE — Progress Notes (Signed)
Patient ID: ONEISHA OZIER, female   DOB: 1945-04-28, 70 y.o.   MRN: [id number] Patient ID: KENDRAH KESSINGER, female   DOB: 11-Mar-1946, 70 y.o.   MRN: [id number]  Anita Progress Note  CHARMEN TORNES [id number] 70 y.o.  08/18/2015 2:07 PM  Chief Complaint: "I am better"  History of Present Illness: States she had been drinking up to 4 drinks several times a week.    Her anxiety is much worse since her best friend died 2 weeks ago of cancer.  She is easily overwhelmed and very indecisive. States she does better when out with others. Anxiety is worse when she is alone. Xanax helps her compartmentalize the things she needs to do. Notes she gets "panicky" when having to talk to loved ones. States she avoid answering phones but it is getting better.    Pt is taking Xanax 2 BID and 2 tabs qHS. Pt is sleeping better and getting about 9 hrs of good sleep. She rarely naps during the day. Appetite is ok Energy is fair most days.   Depression is present and worse due to recent stress. Reports worthlessness and hopelessness, isolation and crying spells. States her anhedonia is a little better. Pt is not happy or excited by anything and everything seems like a chore. Distractions  helps her feel better. Pt is spending her time reading.   Pt takes Effexor XR, Wellbutrin and Xanax as prescribed and denies SE.   Pt is in therapy and she states it is helping.    Suicidal Ideation: No  Plan Formed: No Patient has means to carry out plan: No  Homicidal Ideation: No Plan Formed: No Patient has means to carry out plan: No  Review of Systems: Psychiatric: Agitation: No Hallucination: No Depressed Mood: Yes Insomnia: No Hypersomnia: No Altered Concentration: Yes Feels Worthless: Yes Grandiose Ideas: No Belief In Special Powers: No New/Increased Substance Abuse: No Compulsions: No  Neurologic: Headache: No Seizure: No Paresthesias: No   Review of Systems   Constitutional: Negative for fever, chills and malaise/fatigue.  HENT: Negative for congestion, ear discharge, nosebleeds and sore throat.   Eyes: Positive for blurred vision. Negative for double vision, pain and redness.  Respiratory: Negative for cough, shortness of breath and wheezing.   Cardiovascular: Negative for chest pain, palpitations and leg swelling.  Gastrointestinal: Negative for heartburn, nausea, vomiting and abdominal pain.  Musculoskeletal: Positive for back pain and joint pain. Negative for neck pain.  Skin: Negative for itching and rash.  Neurological: Positive for tremors. Negative for dizziness, sensory change, seizures, loss of consciousness, weakness and headaches.  Psychiatric/Behavioral: Positive for depression. Negative for suicidal ideas, hallucinations and substance abuse. The patient is nervous/anxious. The patient does not have insomnia.      Past Medical Family, Social History: lives in [place] with her husband. Has 3 kids. Pt retired in 2010. She worked as a Network engineer at [employer].  reports that she has been smoking Cigarettes.  She has a 50 pack-year smoking history. She has never used smokeless tobacco. She reports that she drinks alcohol. She reports that she does not use illicit drugs.  Family History  Problem Relation Age of Onset  . Cancer Mother     colon  . Dementia Mother   . Depression Mother   . Heart disease Father 24  . Heart disease Sister 50    stents, pacemaker  . Depression Sister   . Heart disease Brother 45    CABG, MI  .  Depression Brother   . Obesity Daughter   . Suicidality Daughter   . Bipolar disorder Daughter   . Obesity Son   . Mental illness Daughter     bipolar  . Obesity Daughter   . Dementia Paternal Aunt   . Depression Sister    Past Medical History  Diagnosis Date  . DEPRESSION 08/26/2008  . HYPERTENSION 08/26/2008  . HYPOTHYROIDISM 08/26/2008  . OSTEOPENIA 08/26/2008  . TOBACCO USER 12/25/2009  .  Obesity   . Hx of colonic polyps     First noted on  colonoscopy 2012  . Hypercholesteremia     Outpatient Encounter Prescriptions as of 08/18/2015  Medication Sig  . acetaminophen (TYLENOL) 325 MG tablet Take 2 tablets (650 mg total) by mouth every 6 (six) hours as needed for mild pain (or Temp > 100).  [name] alendronate (FOSAMAX) 70 MG tablet   . ALPRAZolam (XANAX) 0.5 MG tablet Take one tab po qAM and noon. Take 1mg  (2 tabs) po qHS prn insomnia  . aspirin 81 MG tablet Take 81 mg by mouth daily.  . budesonide-formoterol (SYMBICORT) 160-4.5 MCG/ACT inhaler Inhale 2 puffs into the lungs 2 (two) times daily.  [name] buPROPion (WELLBUTRIN SR) 200 MG 12 hr tablet Take 1 tablet (200 mg total) by mouth 2 (two) times daily.  [name] buPROPion (WELLBUTRIN SR) 200 MG 12 hr tablet TAKE 1 TABLET (200 MG TOTAL) BY MOUTH 2 (TWO) TIMES DAILY.  [name] Cholecalciferol (VITAMIN D3) 50000 UNITS CAPS Take 1 capsule by mouth every 7 (seven) days.  . Cyanocobalamin (VITAMIN B-12 PO) Take 1 tablet by mouth daily.  . ferrous fumarate (HEMOCYTE - 106 MG FE) 325 (106 FE) MG TABS Take 1 tablet by mouth.  . levothyroxine (SYNTHROID, LEVOTHROID) 100 MCG tablet Take 1 tablet (100 mcg total) by mouth daily before breakfast.  . lisinopril (PRINIVIL,ZESTRIL) 10 MG tablet Take 1 tablet (10 mg total) by mouth daily.  . Magnesium 250 MG TABS Take 250 mg by mouth 2 (two) times daily.   . simvastatin (ZOCOR) 10 MG tablet Take 1 tablet (10 mg total) by mouth at bedtime.  [name] venlafaxine XR (EFFEXOR-XR) 75 MG 24 hr capsule Take 3 capsules (225 mg total) by mouth daily with breakfast.   No facility-administered encounter medications on file as of 08/18/2015.    Past Psychiatric History/Hospitalization(s): Anxiety: Yes Bipolar Disorder: No Depression: Yes Mania: No Psychosis: No Schizophrenia: No Personality Disorder: No Hospitalization for psychiatric illness: No History of Electroconvulsive Shock Therapy: No Prior Suicide Attempts:  No  Physical Exam: Constitutional:  BP 130/80 mmHg  Pulse 76  Ht 5\' 3"  (1.6 m)  Wt 202 lb 12.8 oz (91.989 kg)  BMI 35.93 kg/m2  General Appearance: alert, oriented, no acute distress  Musculoskeletal: Strength & Muscle Tone: within normal limits Gait & Station: normal Patient leans: straight  Mental Status Examination/Evaluation: Objective: Attitude: Calm and cooperative  Appearance: Fairly Groomed, appears to be stated age  Eye Contact::  Good  Speech:  Clear and Coherent and Normal Rate  Volume:  Normal  Mood:  Depressed and anxious  Affect:  Congruent  Thought Process:  Linear and Logical  Orientation:  Full (Time, Place, and Person)  Thought Content:  WDL  Suicidal Thoughts:  No  Homicidal Thoughts:  No  Judgement:  Fair  Insight:  Fair  Concentration: good  Memory: Immediate-good Recent-good Remote-good  Recall: fair  Language: fair  Gait and Station: normal  [employer] of Knowledge: average  Psychomotor Activity:  Tremor  Akathisia:  No  Handed:  Right  AIMS (if indicated): n/a  Assets:  Communication Skills Desire for Improvement Financial Resources/Insurance Housing Intimacy Leisure Time Resilience Social Support English as a second language teacher (Choose Three): Established Problem, Stable/Improving (1), Review of Psycho-Social Stressors (1), Established Problem, Worsening (2), Review of Medication Regimen & Side Effects (2) and Review of New Medication or Change in Dosage (2)  Assessment: AXIS I MDD- recurrent, moderate. GAD. Insomnia; Nicotine dependence  AXIS II Deferred        Treatment Plan/Recommendations:  Plan of Care:  Medication management with supportive therapy. Risks/benefits and SE of the medication discussed. Pt verbalized understanding and verbal consent obtained for treatment.  Affirm with the patient that the medications are taken as ordered. Patient expressed  understanding of how their medications were to be used.     Laboratory: Reviewed labs 02/03/2015 CBC WNL  Psychotherapy: Therapy: brief supportive therapy provided. Discussed psychosocial stressors in detail.  - recommend pt stop all alcohol use  -discussed smoking cessation. Smoking cessation instruction/counseling given: counseled patient on the dangers of tobacco use, advised patient to stop smoking, and reviewed strategies to maximize success    Medications:   Effexor XR 225mg  po qD for depression and anxiety Wellbutrin SR to 200mg  po BID for depression   Xanax 0.5mg  po BID and 1mg  po qHS prn anxiety     Routine PRN Medications: Yes  Consultations: encouraged to f/up with PCP as needed, encouraged to start individual therapy  Safety Concerns: Pt denies SI and is at an acute low risk for suicide.Patient told to call clinic if any problems occur. Patient advised to go to ER if they should develop SI/HI, side effects, or if symptoms worsen. Has crisis numbers to call if needed. Pt verbalized understanding.   Other: F/up in 8 weeks or sooner if needed     [name], MD 08/18/2015

## 2015-08-26 ENCOUNTER — Encounter (HOSPITAL_COMMUNITY): Payer: Self-pay | Admitting: Emergency Medicine

## 2015-08-26 ENCOUNTER — Emergency Department (HOSPITAL_COMMUNITY)
Admission: EM | Admit: 2015-08-26 | Discharge: 2015-08-28 | Disposition: A | Discharge status: Other Acute Inpatient Hospital | Payer: Medicare Other | Attending: Emergency Medicine | Admitting: Emergency Medicine

## 2015-08-26 ENCOUNTER — Ambulatory Visit (INDEPENDENT_AMBULATORY_CARE_PROVIDER_SITE_OTHER): Payer: 59 | Admitting: Clinical

## 2015-08-26 ENCOUNTER — Encounter (HOSPITAL_COMMUNITY): Payer: Self-pay | Admitting: Clinical

## 2015-08-26 DIAGNOSIS — F10129 Alcohol abuse with intoxication, unspecified: Secondary | ICD-10-CM | POA: Insufficient documentation

## 2015-08-26 DIAGNOSIS — F331 Major depressive disorder, recurrent, moderate: Secondary | ICD-10-CM | POA: Diagnosis not present

## 2015-08-26 DIAGNOSIS — I10 Essential (primary) hypertension: Secondary | ICD-10-CM | POA: Insufficient documentation

## 2015-08-26 DIAGNOSIS — F411 Generalized anxiety disorder: Secondary | ICD-10-CM | POA: Diagnosis not present

## 2015-08-26 DIAGNOSIS — Z86010 Personal history of colonic polyps: Secondary | ICD-10-CM | POA: Diagnosis not present

## 2015-08-26 DIAGNOSIS — F1310 Sedative, hypnotic or anxiolytic abuse, uncomplicated: Secondary | ICD-10-CM | POA: Diagnosis not present

## 2015-08-26 DIAGNOSIS — M8580 Other specified disorders of bone density and structure, unspecified site: Secondary | ICD-10-CM | POA: Insufficient documentation

## 2015-08-26 DIAGNOSIS — R45851 Suicidal ideations: Secondary | ICD-10-CM | POA: Insufficient documentation

## 2015-08-26 DIAGNOSIS — G4700 Insomnia, unspecified: Secondary | ICD-10-CM | POA: Diagnosis not present

## 2015-08-26 DIAGNOSIS — E7800 Pure hypercholesterolemia, unspecified: Secondary | ICD-10-CM | POA: Diagnosis not present

## 2015-08-26 DIAGNOSIS — Z7951 Long term (current) use of inhaled steroids: Secondary | ICD-10-CM | POA: Diagnosis not present

## 2015-08-26 DIAGNOSIS — F17210 Nicotine dependence, cigarettes, uncomplicated: Secondary | ICD-10-CM | POA: Diagnosis not present

## 2015-08-26 DIAGNOSIS — Z9884 Bariatric surgery status: Secondary | ICD-10-CM | POA: Insufficient documentation

## 2015-08-26 DIAGNOSIS — E039 Hypothyroidism, unspecified: Secondary | ICD-10-CM | POA: Insufficient documentation

## 2015-08-26 DIAGNOSIS — F329 Major depressive disorder, single episode, unspecified: Secondary | ICD-10-CM | POA: Diagnosis present

## 2015-08-26 DIAGNOSIS — Z7982 Long term (current) use of aspirin: Secondary | ICD-10-CM | POA: Insufficient documentation

## 2015-08-26 DIAGNOSIS — Z79899 Other long term (current) drug therapy: Secondary | ICD-10-CM | POA: Insufficient documentation

## 2015-08-26 LAB — CBC
HCT: 46.4 % — ABNORMAL HIGH (ref 36.0–46.0)
Hemoglobin: 15.9 g/dL — ABNORMAL HIGH (ref 12.0–15.0)
MCH: 33.5 pg (ref 26.0–34.0)
MCHC: 34.3 g/dL (ref 30.0–36.0)
MCV: 97.9 fL (ref 78.0–100.0)
Platelets: 335 10*3/uL (ref 150–400)
RBC: 4.74 MIL/uL (ref 3.87–5.11)
RDW: 14 % (ref 11.5–15.5)
WBC: 9.7 10*3/uL (ref 4.0–10.5)

## 2015-08-26 LAB — COMPREHENSIVE METABOLIC PANEL
ALT: 30 U/L (ref 14–54)
AST: 40 U/L (ref 15–41)
Albumin: 4.6 g/dL (ref 3.5–5.0)
Alkaline Phosphatase: 70 U/L (ref 38–126)
Anion gap: 12 (ref 5–15)
BUN: 11 mg/dL (ref 6–20)
CO2: 26 mmol/L (ref 22–32)
Calcium: 9.9 mg/dL (ref 8.9–10.3)
Chloride: 102 mmol/L (ref 101–111)
Creatinine, Ser: 0.9 mg/dL (ref 0.44–1.00)
GFR calc Af Amer: >60 mL/min (ref >60)
GFR calc non Af Amer: >60 mL/min (ref >60)
Glucose, Bld: 99 mg/dL (ref 65–99)
Potassium: 3.7 mmol/L (ref 3.5–5.1)
Sodium: 140 mmol/L (ref 135–145)
Total Bilirubin: 0.9 mg/dL (ref 0.3–1.2)
Total Protein: 7.6 g/dL (ref 6.5–8.1)

## 2015-08-26 LAB — ACETAMINOPHEN LEVEL: Acetaminophen (Tylenol), Serum: <10 ug/mL — ABNORMAL LOW (ref 10–30)

## 2015-08-26 LAB — RAPID URINE DRUG SCREEN, HOSP PERFORMED
Amphetamines: NOT DETECTED
Barbiturates: NOT DETECTED
Benzodiazepines: POSITIVE — AB
Cocaine: NOT DETECTED
Opiates: NOT DETECTED
Tetrahydrocannabinol: NOT DETECTED

## 2015-08-26 LAB — SALICYLATE LEVEL: Salicylate Lvl: <4 mg/dL (ref 2.8–30.0)

## 2015-08-26 LAB — ETHANOL: Alcohol, Ethyl (B): <5 mg/dL (ref <5)

## 2015-08-26 MED ORDER — LORAZEPAM 1 MG PO TABS
1.0000 mg | ORAL_TABLET | Freq: Four times a day (QID) | ORAL | Status: DC | PRN
  Administered 2015-08-26 – 2015-08-28 (×4): 1 mg via ORAL
  Filled 2015-08-26 (×4): qty 1

## 2015-08-26 MED ORDER — LISINOPRIL 10 MG PO TABS
10.0000 mg | ORAL_TABLET | Freq: Every day | ORAL | Status: DC
  Administered 2015-08-26 – 2015-08-28 (×3): 10 mg via ORAL
  Filled 2015-08-26 (×3): qty 1

## 2015-08-26 MED ORDER — VITAMIN B-1 100 MG PO TABS
100.0000 mg | ORAL_TABLET | Freq: Every day | ORAL | Status: DC
  Administered 2015-08-26 – 2015-08-28 (×3): 100 mg via ORAL
  Filled 2015-08-26 (×3): qty 1

## 2015-08-26 MED ORDER — FOLIC ACID 1 MG PO TABS
1.0000 mg | ORAL_TABLET | Freq: Every day | ORAL | Status: DC
  Administered 2015-08-26 – 2015-08-28 (×3): 1 mg via ORAL
  Filled 2015-08-26 (×3): qty 1

## 2015-08-26 MED ORDER — VENLAFAXINE HCL ER 75 MG PO CP24
225.0000 mg | ORAL_CAPSULE | Freq: Every day | ORAL | Status: DC
  Administered 2015-08-27 – 2015-08-28 (×2): 225 mg via ORAL
  Filled 2015-08-26 (×3): qty 1

## 2015-08-26 MED ORDER — BUPROPION HCL ER (SR) 100 MG PO TB12
200.0000 mg | ORAL_TABLET | Freq: Two times a day (BID) | ORAL | Status: DC
  Administered 2015-08-26 – 2015-08-27 (×2): 200 mg via ORAL
  Filled 2015-08-26 (×3): qty 2

## 2015-08-26 MED ORDER — SIMVASTATIN 10 MG PO TABS
10.0000 mg | ORAL_TABLET | Freq: Every day | ORAL | Status: DC
  Administered 2015-08-26 – 2015-08-27 (×2): 10 mg via ORAL
  Filled 2015-08-26 (×3): qty 1

## 2015-08-26 MED ORDER — LORAZEPAM 1 MG PO TABS
0.0000 mg | ORAL_TABLET | Freq: Four times a day (QID) | ORAL | Status: DC
  Administered 2015-08-27: 2 mg via ORAL
  Administered 2015-08-27 – 2015-08-28 (×2): 1 mg via ORAL
  Filled 2015-08-26: qty 1
  Filled 2015-08-26: qty 2
  Filled 2015-08-26: qty 1

## 2015-08-26 MED ORDER — LEVOTHYROXINE SODIUM 100 MCG PO TABS
100.0000 ug | ORAL_TABLET | Freq: Every day | ORAL | Status: DC
  Administered 2015-08-27 – 2015-08-28 (×2): 100 ug via ORAL
  Filled 2015-08-26 (×3): qty 1

## 2015-08-26 MED ORDER — ASPIRIN EC 81 MG PO TBEC
81.0000 mg | DELAYED_RELEASE_TABLET | Freq: Every day | ORAL | Status: DC
  Administered 2015-08-27 – 2015-08-28 (×2): 81 mg via ORAL
  Filled 2015-08-26 (×2): qty 1

## 2015-08-26 MED ORDER — FERROUS FUMARATE 324 (106 FE) MG PO TABS
1.0000 | ORAL_TABLET | Freq: Every day | ORAL | Status: DC
  Administered 2015-08-27 – 2015-08-28 (×2): 106 mg via ORAL
  Filled 2015-08-26 (×2): qty 1

## 2015-08-26 MED ORDER — THIAMINE HCL 100 MG/ML IJ SOLN: 100.0000 mg | Freq: Every day | INTRAMUSCULAR | Status: DC

## 2015-08-26 MED ORDER — MOMETASONE FURO-FORMOTEROL FUM 200-5 MCG/ACT IN AERO
2.0000 | INHALATION_SPRAY | Freq: Two times a day (BID) | RESPIRATORY_TRACT | Status: DC
  Administered 2015-08-27 – 2015-08-28 (×3): 2 via RESPIRATORY_TRACT
  Filled 2015-08-26: qty 8.8

## 2015-08-26 MED ORDER — ADULT MULTIVITAMIN W/MINERALS CH
1.0000 | ORAL_TABLET | Freq: Every day | ORAL | Status: DC
  Administered 2015-08-26 – 2015-08-28 (×3): 1 via ORAL
  Filled 2015-08-26 (×3): qty 1

## 2015-08-26 MED ORDER — LORAZEPAM 1 MG PO TABS: 0.0000 mg | ORAL_TABLET | Freq: Two times a day (BID) | ORAL | Status: DC

## 2015-08-26 MED ORDER — LORAZEPAM 2 MG/ML IJ SOLN: 1.0000 mg | Freq: Four times a day (QID) | INTRAMUSCULAR | Status: DC | PRN

## 2015-08-26 NOTE — BH Assessment (Addendum)
Assessment Note  Kendra Ball is an 70 y.o. female. She presents to [facility] with family (2 daughters and spouse). Patient referred to [facility] by her outpatient therapist "Tharon Aquas" at [facility]. Patient has a complaint of current passive suicidal thoughts. She does not have a suicidal plan. Sts, "I wake up and think about death sometimes but I would never do it". Patient denies previous suicidal attempts/gestures. Patient denies self mutilating behaviors. Patient has a history of depression and over the past several months symptoms have worsened. She reports symptoms of hopelessness, crying spells, isolating self from others, fatigue, and guilt. Patient's appetite is fair. Sts, "If I have to pick between alcohol and food.Marland KitchenMarland KitchenMarland KitchenI will pick alcohol". Patients typically sleeps ok depending on stress level and if she takes prescription medication "Xanax". Patient asked about her stressors and sts, "I don't have any stressors that's why I don't understand why I feel this way". Patient has increased anxiety. Patient has family history (youngest daughter) of Schizophrenia and Bipolar Disorder.   Patient denies HI. She is calm and cooperative. No legal issues. Patient denies AVH's. Patient does not appear to be responding to internal stimuli.   Patient sts that she self medicates with alcohol usage. Patient started drinking in her early 48's. She drinks anywhere from 3 glasses of wine to 3 mix drinks. Alternative days patient may drink almost a bottle of wine. Patient's last drink was 2 glasses of wine this morning approximately 11 am. Patient reports current withdrawal symptoms of tremors and "cravings". Patient denies history of seizures and black outs.   Patient is dressed in casual clothing. Her mood is depressed. Patient's affect is flat. Thought process is coherent and relevant. Concentration is decreased. Patient is oriented to time, person, place, and situation.   Patient has no history of INPT psychiatric or  substance abuse treatment    Diagnosis: Major Depressive Disorder, Recurrent, Severe without psychotic features; Anxiety Disorder; Alcohol Use Disorder; Severe   Past Medical History:  Past Medical History  Diagnosis Date  . DEPRESSION 08/26/2008  . HYPERTENSION 08/26/2008  . HYPOTHYROIDISM 08/26/2008  . OSTEOPENIA 08/26/2008  . TOBACCO USER 12/25/2009  . Obesity   . Hx of colonic polyps     First noted on  colonoscopy 2012  . Hypercholesteremia     Past Surgical History  Procedure Laterality Date  . Gastric bypass    . Cesarean section    . Dilation and curettage of uterus      x2  . Forearm fracture surgery      right  . Tonsillectomy    . Colon resection N/A 05/09/2014    Procedure: LAPAROSCOPIC HAND-ASSISTED EXTENDED RIGHT COLECTOMY, LAPAROSCOPIC LYSIS OF ADHESIONS, SPLENIC FLEXURE MOBILIZATION.;  Surgeon: [name], MD;  Location: [facility] ORS;  Service: General;  Laterality: N/A;    Family History:  Family History  Problem Relation Age of Onset  . Cancer Mother     colon  . Dementia Mother   . Depression Mother   . Heart disease Father 59  . Heart disease Sister 62    stents, pacemaker  . Depression Sister   . Heart disease Brother 82    CABG, MI  . Depression Brother   . Obesity Daughter   . Suicidality Daughter   . Bipolar disorder Daughter   . Obesity Son   . Mental illness Daughter     bipolar  . Obesity Daughter   . Dementia Paternal Aunt   . Depression Sister     Social  History:  reports that she has been smoking Cigarettes.  She has a 50 pack-year smoking history. She has never used smokeless tobacco. She reports that she drinks alcohol. She reports that she does not use illicit drugs.  Additional Social History:  Alcohol / Drug Use Pain Medications: SEE MAR Prescriptions: SEE MAR Over the Counter: SEE MAR History of alcohol / drug use?: Yes Substance #1 Name of Substance 1: Alcohol  1 - Age of First Use: 20s 1 - Amount (size/oz): Varies; "In one  night I may have 2 mixed drinks and 2 glasses of wine"; "Yesterday I had almost an entire glass of wine" 1 - Frequency: daily  1 - Duration: past year; increased usage in the past 4 months  1 - Last Use / Amount: "This morning"; 08/26/2015; 2 glasses of wine  CIWA: CIWA-Ar BP: 139/64 mmHg Pulse Rate: 82 COWS:    Allergies:  Allergies  Allergen Reactions  . Morphine Sulfate     Swelling around injection area    Home Medications:  (Not in a hospital admission)  OB/GYN Status:  No LMP recorded. Patient is postmenopausal.  General Assessment Data Location of Assessment: [facility] TTS Assessment: In system Is this a Tele or Face-to-Face Assessment?: Tele Assessment Is this an Initial Assessment or a Re-assessment for this encounter?: Initial Assessment Marital status: Married [place] name:  (n/a) Is patient pregnant?: No Pregnancy Status: No Living Arrangements: Other (Comment) (spouse ) Can pt return to current living arrangement?: Yes Admission Status: Voluntary Is patient capable of signing voluntary admission?: Yes Referral Source: Self/Family/Friend Insurance type:  Secretary/administrator)  Medical Screening Exam ([place]) Medical Exam completed: No Reason for MSE not completed: Other:  Crisis Care Plan Living Arrangements: Other (Comment) (spouse ) Legal Guardian: Other: (no legal guardian ) Name of Psychiatrist:  (Dr. Darrall Dears @ outpatient [facility]) Name of Therapist:  Bunnie Pion outpatient [facility])  Education Status Is patient currently in school?: No Current Grade:  (n/a) Highest grade of school patient has completed:  (n/a) Name of school:  (n/a) Contact person:  (n/a)  Risk to self with the past 6 months Suicidal Ideation: Yes-Currently Present (passive thoughts) Has patient been a risk to self within the past 6 months prior to admission? : No Suicidal Intent: No Has patient had any suicidal intent within the past 6 months prior to admission? : No Is patient  at risk for suicide?: No Suicidal Plan?: No Has patient had any suicidal plan within the past 6 months prior to admission? : No Access to Means: No What has been your use of drugs/alcohol within the last 12 months?:  (patient is calm and cooperative ) Previous Attempts/Gestures: No How many times?:  (n/a) Other Self Harm Risks:  (n/a) Intentional Self Injurious Behavior: None Family Suicide History: No Persecutory voices/beliefs?: No Depression: Yes Depression Symptoms: Feeling angry/irritable, Feeling worthless/self pity, Loss of interest in usual pleasures, Fatigue, Guilt, Isolating, Tearfulness, Insomnia, Despondent Substance abuse history and/or treatment for substance abuse?: No Suicide prevention information given to non-admitted patients: Not applicable  Risk to Others within the past 6 months Homicidal Ideation: No Does patient have any lifetime risk of violence toward others beyond the six months prior to admission? : No Thoughts of Harm to Others: No Current Homicidal Intent: No Current Homicidal Plan: No Access to Homicidal Means: No Identified Victim:  (n/a) History of harm to others?: No Assessment of Violence: None Noted Violent Behavior Description:  (patient is calm and cooperative) Does patient have access  to weapons?: No Criminal Charges Pending?: No Does patient have a court date: No Is patient on probation?: No  Psychosis Hallucinations: None noted Delusions: None noted  Mental Status Report Appearance/Hygiene: Disheveled Eye Contact: Good Motor Activity: Freedom of movement Speech: Logical/coherent Level of Consciousness: Alert Mood: Depressed Affect: Appropriate to circumstance Anxiety Level: None Thought Processes: Coherent, Relevant Judgement: Impaired Orientation: Place, Person, Time, Situation Obsessive Compulsive Thoughts/Behaviors: None  Cognitive Functioning Concentration: Decreased Memory: Recent Intact, Remote Intact IQ:  Average Insight: Poor Impulse Control: Poor Appetite: Poor Weight Loss:  ("I would rather drink") Weight Gain:  ("I would rather drink") Sleep: Decreased Total Hours of Sleep:  (varies ) Vegetative Symptoms: None  ADLScreening [facility] Assessment Services) Patient's cognitive ability adequate to safely complete daily activities?: Yes Patient able to express need for assistance with ADLs?: Yes Independently performs ADLs?: Yes (appropriate for developmental age)  Prior Inpatient Therapy Prior Inpatient Therapy: No Prior Therapy Dates:  (n/a) Prior Therapy Facilty/Provider(s):  (n/a) Reason for Treatment:  (n/a)  Prior Outpatient Therapy Prior Outpatient Therapy: Yes Prior Therapy Dates:  (current) Prior Therapy Facilty/Provider(s):  ([facility]-Dr. Darrall Dears and "[facility]") Reason for Treatment:  (medication managment and therapy for depression, anxiety, al) Does patient have an ACCT team?: No Does patient have Intensive In-House Services?  : No Does patient have [facility] services? : No Does patient have P4CC services?: No  ADL Screening (condition at time of admission) Patient's cognitive ability adequate to safely complete daily activities?: Yes Is the patient deaf or have difficulty hearing?: No Does the patient have difficulty seeing, even when wearing glasses/contacts?: No Does the patient have difficulty concentrating, remembering, or making decisions?: Yes Patient able to express need for assistance with ADLs?: Yes Does the patient have difficulty dressing or bathing?: No Independently performs ADLs?: Yes (appropriate for developmental age) Does the patient have difficulty walking or climbing stairs?: No Weakness of Legs: None Weakness of Arms/Hands: None  Home Assistive Devices/Equipment Home Assistive Devices/Equipment: None    Abuse/Neglect Assessment (Assessment to be complete while patient is alone) Physical Abuse: Denies Verbal Abuse: Denies Sexual Abuse:  Denies Exploitation of patient/patient's resources: Denies Self-Neglect: Denies Values / Beliefs Cultural Requests During Hospitalization: None Spiritual Requests During Hospitalization: None   Advance Directives (For Healthcare) Does patient have an advance directive?: No Would patient like information on creating an advanced directive?: No - patient declined information Nutrition Screen- [facility] Adult/[facility] Patient's home diet: Regular  Additional Information 1:1 In Past 12 Months?: No CIRT Risk: No Elopement Risk: No Does patient have medical clearance?: Yes     Disposition:  Disposition Initial Assessment Completed for this Encounter: Yes Disposition of Patient: Inpatient treatment program Reginold Agent, NP recommends INPT treatment) Type of inpatient treatment program: Adult  On Site Evaluation by:   Reviewed with Physician:    Waldon Merl [facility] 08/26/2015 6:27 PM

## 2015-08-26 NOTE — ED Provider Notes (Signed)
CSN: [id number]     Arrival date & time 08/26/15  1540 History   First MD Initiated Contact with Patient 08/26/15 1709     Chief Complaint  Patient presents with  . Depression  . Anxiety    detox     (Consider location/radiation/quality/duration/timing/severity/associated sxs/prior Treatment) The history is provided by the patient.  Kendra Ball is a 70 y.o. female history depression, hypertension, alcohol abuse here presenting with suicidal ideation, depression, anxiety, detox. Patient relapsed on alcohol for the last year or so. The last weekend, she has been drinking more with family visiting. He has been more depressed and feels like life was not worth living anymore. She denies any plans to overdose or harm herself. She also has been more anxious. She was seen by a therapist at [facility] and sent here for psych evaluation. Last alcohol use was 11 am this morning,.    Past Medical History  Diagnosis Date  . DEPRESSION 08/26/2008  . HYPERTENSION 08/26/2008  . HYPOTHYROIDISM 08/26/2008  . OSTEOPENIA 08/26/2008  . TOBACCO USER 12/25/2009  . Obesity   . Hx of colonic polyps     First noted on  colonoscopy 2012  . Hypercholesteremia    Past Surgical History  Procedure Laterality Date  . Gastric bypass    . Cesarean section    . Dilation and curettage of uterus      x2  . Forearm fracture surgery      right  . Tonsillectomy    . Colon resection N/A 05/09/2014    Procedure: LAPAROSCOPIC HAND-ASSISTED EXTENDED RIGHT COLECTOMY, LAPAROSCOPIC LYSIS OF ADHESIONS, SPLENIC FLEXURE MOBILIZATION.;  Surgeon: [name], MD;  Location: [facility];  Service: General;  Laterality: N/A;   Family History  Problem Relation Age of Onset  . Cancer Mother     colon  . Dementia Mother   . Depression Mother   . Heart disease Father 62  . Heart disease Sister 1    stents, pacemaker  . Depression Sister   . Heart disease Brother 13    CABG, MI  . Depression Brother   . Obesity Daughter   . Suicidality  Daughter   . Bipolar disorder Daughter   . Obesity Son   . Mental illness Daughter     bipolar  . Obesity Daughter   . Dementia Paternal Aunt   . Depression Sister    Social History  Substance Use Topics  . Smoking status: Current Every Day Smoker -- 1.00 packs/day for 50 years    Types: Cigarettes  . Smokeless tobacco: Never Used  . Alcohol Use: Yes     Comment: 3-4 nights a week she will have 3-4 bourbens or 1 glass of wine   OB History    No data available     Review of Systems  Psychiatric/Behavioral: Positive for depression, suicidal ideas and dysphoric mood.  All other systems reviewed and are negative.     Allergies  Morphine sulfate  Home Medications   Prior to Admission medications   Medication Sig Start Date End Date Taking? Authorizing Provider  ALPRAZolam Duanne Moron) 0.5 MG tablet Take one tab po qAM and noon. Take 1mg  (2 tabs) po qHS prn insomnia Patient taking differently: Take 0.5 mg by mouth 4 (four) times daily.  08/18/15  Yes [name], MD  aspirin 81 MG tablet Take 81 mg by mouth daily.   Yes Historical Provider, MD  budesonide-formoterol (SYMBICORT) 160-4.5 MCG/ACT inhaler Inhale 2 puffs into the lungs 2 (two)  times daily. 02/13/15  Yes Noralee Space, MD  buPROPion (WELLBUTRIN SR) 200 MG 12 hr tablet Take 1 tablet (200 mg total) by mouth 2 (two) times daily. 08/18/15  Yes [name], MD  Cyanocobalamin (VITAMIN B-12 PO) Take 1 tablet by mouth daily.   Yes Historical Provider, MD  ferrous fumarate (HEMOCYTE - 106 MG FE) 325 (106 FE) MG TABS Take 1 tablet by mouth daily.    Yes Historical Provider, MD  levothyroxine (SYNTHROID, LEVOTHROID) 100 MCG tablet Take 1 tablet (100 mcg total) by mouth daily before breakfast. 07/13/15  Yes Renee A Kuneff, DO  lisinopril (PRINIVIL,ZESTRIL) 10 MG tablet Take 1 tablet (10 mg total) by mouth daily. 07/13/15  Yes Renee A Kuneff, DO  Magnesium 250 MG TABS Take 250 mg by mouth 2 (two) times daily.    Yes Historical  Provider, MD  simvastatin (ZOCOR) 10 MG tablet Take 1 tablet (10 mg total) by mouth at bedtime. 07/01/15  Yes Renee A Kuneff, DO  venlafaxine XR (EFFEXOR-XR) 75 MG 24 hr capsule Take 3 capsules (225 mg total) by mouth daily with breakfast. 08/18/15  Yes [name], MD  acetaminophen (TYLENOL) 325 MG tablet Take 2 tablets (650 mg total) by mouth every 6 (six) hours as needed for mild pain (or Temp > 100). Patient not taking: Reported on 08/26/2015 05/13/14   Earnstine Regal, PA-C  alendronate (FOSAMAX) 70 MG tablet Take 70 mg by mouth once a week. Takes on Tues. 01/03/15   Historical Provider, MD  Cholecalciferol (VITAMIN D3) 50000 UNITS CAPS Take 1 capsule by mouth every 7 (seven) days. 12/25/14   Renee A Kuneff, DO   BP 139/64 mmHg  Pulse 82  Temp(Src) 98 F (36.7 C) (Oral)  Resp 20  SpO2 93% Physical Exam  Constitutional: She is oriented to person, place, and time.  Depressed, anxious   HENT:  Head: Normocephalic.  Mouth/Throat: Oropharynx is clear and moist.  Eyes: Conjunctivae are normal. Pupils are equal, round, and reactive to light.  Neck: Normal range of motion. Neck supple.  Cardiovascular: Normal rate, regular rhythm and normal heart sounds.   Pulmonary/Chest: Effort normal and breath sounds normal. No respiratory distress. She has no wheezes. She has no rales.  Abdominal: Soft. Bowel sounds are normal. She exhibits no distension. There is no tenderness. There is no rebound.  Musculoskeletal: Normal range of motion. She exhibits no edema or tenderness.  Neurological: She is alert and oriented to person, place, and time.  No obvious tremors   Skin: Skin is warm and dry.  Psychiatric:  Depressed, poor judgment   Nursing note and vitals reviewed.   ED Course  Procedures (including critical care time) Labs Review Labs Reviewed  ACETAMINOPHEN LEVEL - Abnormal; Notable for the following:    Acetaminophen (Tylenol), Serum <10 (*)    All other components within normal limits   CBC - Abnormal; Notable for the following:    Hemoglobin 15.9 (*)    HCT 46.4 (*)    All other components within normal limits  URINE RAPID DRUG SCREEN, HOSP PERFORMED - Abnormal; Notable for the following:    Benzodiazepines POSITIVE (*)    All other components within normal limits  COMPREHENSIVE METABOLIC PANEL  ETHANOL  SALICYLATE LEVEL    Imaging Review No results found. I have personally reviewed and evaluated these images and lab results as part of my medical decision-making.   EKG Interpretation None      MDM   Final diagnoses:  None  Kendra Ball is a 70 y.o. female here with depression, anxiety, passive suicidal ideations and alcohol abuse. Will get TTS consult, labs. Will likely start on CIWA.   8:58 PM ETOH neg. Will start on CIWA. Psych recommend inpatient admission.    Wandra Arthurs, MD 08/26/15 2059

## 2015-08-26 NOTE — Progress Notes (Signed)
   THERAPIST PROGRESS NOTE  Session Time: 2:35 - 3:30   Participation Level: Active  Behavioral Response: CasualAlertDepressed  Type of Therapy: Individual Therapy  Treatment Goals addressed: Improve psychiatric symptoms, Crisis Care  Interventions:  Motivational Interviewing, psychoeducation  Summary: Kendra Ball is Ball 70 y.o. female who presents with Major Depressive Disorder, recurrent, moderate and Generalized Anxiety Disorder  Suicidal/Homicidal: No -without intent/plan  Therapist Response:  Kendra Ball met with clinician for an individual session. Kendra Ball was joined by her husband and two daughters. Kendra Ball discussed her psychiatric symptoms, and her current life events. Kendra Ball shared that she has been drinking more than she had let family or clinician know. Kendra Ball shared that she did not understand how her drinking got out of control.Clinician asked open ended questions and [facility] answered.  She shared that she was drinking secretly and that she was also drinking sometimes in the morning. Family members pieced together some of the facts. Clinician shared that Xanax and alcohol was Ball bad mix. Kendra Ball stated she did not mix the two.  Client and clinician and family members discussed the possibility of her going inpatient for detox. The family agreed to get her stuff together and take her to be evaluated. The daughter shared that she had called and that they did not have Ball bed but suggested that she be evaluated in the ER and that she would be moved to Ball bed if all lined up. Client and Family discussed their emotions about the discovery and their support for Kendra Ball. Kendra Ball asked what would happen after (if she is admitted). Client and clinician discussed some possible options after discharge including IOP. Kendra Ball asked if she would be able to return to clinician. Clinician shared that most likely she could after completing after care. Kendra Ball and her family agreed to take her to be  assessed.  Plan: Return again in 1 -2 weeks.  Diagnosis: Axis I: Major Depressive Disorder, recurrent, moderate and Generalized Anxiety Disorder   Kendra Virella A, LCSW 08/26/2015

## 2015-08-26 NOTE — ED Notes (Signed)
Belongings in hall closet

## 2015-08-26 NOTE — ED Notes (Signed)
Patient arrived to unit and is pleasant on assessment. Pt states she is here for depression. Pt verbally contracts for safety.

## 2015-08-26 NOTE — ED Notes (Addendum)
Voluntary patient here from home with complaints of depression and anxiety. Seeking alcohol detox. Last drink 11am. Seeing therapist at [facility]. SI with no plan

## 2015-08-27 DIAGNOSIS — F411 Generalized anxiety disorder: Secondary | ICD-10-CM

## 2015-08-27 DIAGNOSIS — F331 Major depressive disorder, recurrent, moderate: Secondary | ICD-10-CM

## 2015-08-27 MED ORDER — ACETAMINOPHEN 325 MG PO TABS
650.0000 mg | ORAL_TABLET | Freq: Once | ORAL | Status: AC
  Administered 2015-08-27: 650 mg via ORAL
  Filled 2015-08-27: qty 2

## 2015-08-27 MED ORDER — NICOTINE 21 MG/24HR TD PT24
21.0000 mg | MEDICATED_PATCH | Freq: Every day | TRANSDERMAL | Status: DC
  Administered 2015-08-27 – 2015-08-28 (×2): 21 mg via TRANSDERMAL
  Filled 2015-08-27 (×2): qty 1

## 2015-08-27 MED ORDER — BUSPIRONE HCL 10 MG PO TABS
10.0000 mg | ORAL_TABLET | Freq: Two times a day (BID) | ORAL | Status: DC
  Administered 2015-08-27 – 2015-08-28 (×3): 10 mg via ORAL
  Filled 2015-08-27 (×3): qty 1

## 2015-08-27 NOTE — ED Notes (Signed)
Patient appears cooperative. Anxious, restless. Reports headache, visible tremor. Denies SI, HI, AVH. Reports on going anxiety and depression/hopelessness. States she often prays not to wake up before sleep at night.  Encouragement offered.  Q 15 safety checks in place.

## 2015-08-27 NOTE — Consult Note (Signed)
Kendra Ball   Reason for Ball:  Anxiety, Depression, Alcohol abuse Referring Physician:  EDP Patient Identification: Kendra Ball MRN:  [id number] Principal Diagnosis: GAD (generalized anxiety disorder) Diagnosis:   Patient Active Problem List   Diagnosis Date Noted  . GAD (generalized anxiety disorder) [F41.1] 01/30/2014    Priority: High  . Major depressive disorder, recurrent episode, moderate ([place]) [F33.1] 01/30/2014    Priority: High  . Insomnia [G47.00] 05/05/2015  . COPD (chronic obstructive pulmonary disease) with chronic bronchitis ([place]) [J44.9] 02/13/2015  . Pulmonary nodule seen on imaging study [R91.1] 02/13/2015  . Encounter for smoking cessation counseling [Z71.6, Z72.0] 12/30/2014  . Need for influenza vaccination [Z23] 12/30/2014  . Stress reaction [F43.0] 11/11/2014  . Osteoporosis [M81.0] 11/11/2014  . Breast cancer screening [Z12.39] 09/10/2014  . History of gastric bypass [Z98.890] 09/10/2014  . Essential hypertension [I10] 05/22/2014  . Cigarette nicotine dependence without complication [U20.254] 27/09/2374  . Dyslipidemia [E78.5] 10/03/2013  . Abnormal mammogram [R92.8] 10/01/2013  . H/O gastric bypass [Z98.890] 08/13/2013  . BMI 34.0-34.9,adult [Z68.34] 08/13/2013  . Preventative health care [Z00.00] 08/13/2013  . Skin lesion of face [L98.9] 08/13/2013  . Family history of premature coronary heart disease [Z82.49] 08/13/2013  . Hypothyroidism [E03.9] 08/26/2008  . Depression [F32.9] 08/26/2008  . HYPERTENSION [I10] 08/26/2008  . Osteopenia [M85.80] 08/26/2008    Total Time spent with patient: 45 minutes  Subjective:   Kendra Ball is a 70 y.o. female patient admitted with  Anxiety, Depression, Alcohol abuse.  HPI:  Caucasian female, 70 years old was evaluated for severe anxiety and depression.  Patient also states she relapsed on Alcohol 6-7 months ago which she uses to minimize her anxiety symptoms.  Patient  admits to drinking "a lot of Beer and Liquor daily and her last drink was 11 am yesterday.    She is visibly tremulous and states that she has a long hx of Alcohol use as a young woman.  Sh was sober for 33 years and resumed drinking later.  Patient sees DR Kendra Ball at [facility] health out patient clinic who treats her for Anxiety and depression.  She also sees a Social worker at the clinic.  Patient denies SI/HI/AVH and she has been accepted for admission.  We will be seeking placement at any facility with available bed.  Past Psychiatric History:  GAD, MDD  Risk to Self: Suicidal Ideation: Yes-Currently Present (passive thoughts) Suicidal Intent: No Is patient at risk for suicide?: No Suicidal Plan?: No Access to Means: No What has been your use of drugs/alcohol within the last 12 months?:  (patient is calm and cooperative ) How many times?:  (n/a) Other Self Harm Risks:  (n/a) Intentional Self Injurious Behavior: None Risk to Others: Homicidal Ideation: No Thoughts of Harm to Others: No Current Homicidal Intent: No Current Homicidal Plan: No Access to Homicidal Means: No Identified Victim:  (n/a) History of harm to others?: No Assessment of Violence: None Noted Violent Behavior Description:  (patient is calm and cooperative) Does patient have access to weapons?: No Criminal Charges Pending?: No Does patient have a court date: No Prior Inpatient Therapy: Prior Inpatient Therapy: No Prior Therapy Dates:  (n/a) Prior Therapy Facilty/Provider(s):  (n/a) Reason for Treatment:  (n/a) Prior Outpatient Therapy: Prior Outpatient Therapy: Yes Prior Therapy Dates:  (current) Prior Therapy Facilty/Provider(s):  ([facility] outpatient-Dr. Darrall Ball and "Kendra Ball") Reason for Treatment:  (medication managment and therapy for depression, anxiety, al) Does patient have an ACCT team?: No Does patient have Intensive  In-House Services?  : No Does patient have [facility] services? : No Does patient have P4CC services?:  No  Past Medical History:  Past Medical History  Diagnosis Date  . DEPRESSION 08/26/2008  . HYPERTENSION 08/26/2008  . HYPOTHYROIDISM 08/26/2008  . OSTEOPENIA 08/26/2008  . TOBACCO USER 12/25/2009  . Obesity   . Hx of colonic polyps     First noted on  colonoscopy 2012  . Hypercholesteremia     Past Surgical History  Procedure Laterality Date  . Gastric bypass    . Cesarean section    . Dilation and curettage of uterus      x2  . Forearm fracture surgery      right  . Tonsillectomy    . Colon resection N/A 05/09/2014    Procedure: LAPAROSCOPIC HAND-ASSISTED EXTENDED RIGHT COLECTOMY, LAPAROSCOPIC LYSIS OF ADHESIONS, SPLENIC FLEXURE MOBILIZATION.;  Surgeon: Kendra Boston, MD;  Location: [facility];  Service: General;  Laterality: N/A;   Family History:  Family History  Problem Relation Age of Onset  . Cancer Mother     colon  . Dementia Mother   . Depression Mother   . Heart disease Father 95  . Heart disease Sister 35    stents, pacemaker  . Depression Sister   . Heart disease Brother 80    CABG, MI  . Depression Brother   . Obesity Daughter   . Suicidality Daughter   . Bipolar disorder Daughter   . Obesity Son   . Mental illness Daughter     bipolar  . Obesity Daughter   . Dementia Paternal Aunt   . Depression Sister    Family Psychiatric  History:  Denies Social History:  History  Alcohol Use  . Yes    Comment: 3-4 nights a week she will have 3-4 bourbens or 1 glass of wine     History  Drug Use No    Social History   Social History  . Marital Status: Married    Spouse Name: N/A  . Number of Children: 3  . Years of Education: N/A   Occupational History  . RETIRED    Social History Main Topics  . Smoking status: Current Every Day Smoker -- 1.00 packs/day for 50 years    Types: Cigarettes  . Smokeless tobacco: Never Used  . Alcohol Use: Yes     Comment: 3-4 nights a week she will have 3-4 bourbens or 1 glass of wine  . Drug Use: No  . Sexual Activity: No    Other Topics Concern  . None   Social History Narrative   Kendra Ball lives in [place] with her husband. She has 3 grown children and several grand children. She has joint custody of 3 of her grand children as her daughter has bipolar disorder and has needed assistance with children in past.   Additional Social History:    Allergies:   Allergies  Allergen Reactions  . Morphine Sulfate     Swelling around injection area    Labs:  Results for orders placed or performed during the hospital encounter of 08/26/15 (from the past 48 hour(s))  Rapid urine drug screen (hospital performed)     Status: Abnormal   Collection Time: 08/26/15  5:44 PM  Result Value Ref Range   Opiates NONE DETECTED NONE DETECTED   Cocaine NONE DETECTED NONE DETECTED   Benzodiazepines POSITIVE (A) NONE DETECTED   Amphetamines NONE DETECTED NONE DETECTED   Tetrahydrocannabinol NONE DETECTED NONE DETECTED   Barbiturates  NONE DETECTED NONE DETECTED    Comment:        DRUG SCREEN FOR MEDICAL PURPOSES ONLY.  IF CONFIRMATION IS NEEDED FOR ANY PURPOSE, NOTIFY LAB WITHIN 5 DAYS.        LOWEST DETECTABLE LIMITS FOR URINE DRUG SCREEN Drug Class       Cutoff (ng/mL) Amphetamine      1000 Barbiturate      200 Benzodiazepine   638 Tricyclics       453 Opiates          300 Cocaine          300 THC              50   Comprehensive metabolic panel     Status: None   Collection Time: 08/26/15  6:22 PM  Result Value Ref Range   Sodium 140 135 - 145 mmol/L   Potassium 3.7 3.5 - 5.1 mmol/L   Chloride 102 101 - 111 mmol/L   CO2 26 22 - 32 mmol/L   Glucose, Bld 99 65 - 99 mg/dL   BUN 11 6 - 20 mg/dL   Creatinine, Ser 0.90 0.44 - 1.00 mg/dL   Calcium 9.9 8.9 - 10.3 mg/dL   Total Protein 7.6 6.5 - 8.1 g/dL   Albumin 4.6 3.5 - 5.0 g/dL   AST 40 15 - 41 U/L   ALT 30 14 - 54 U/L   Alkaline Phosphatase 70 38 - 126 U/L   Total Bilirubin 0.9 0.3 - 1.2 mg/dL   GFR calc non Af Amer >60 >60 mL/min   GFR calc Af  Amer >60 >60 mL/min    Comment: (NOTE) The eGFR has been calculated using the CKD EPI equation. This calculation has not been validated in all clinical situations. eGFR's persistently <60 mL/min signify possible Chronic Kidney Disease.    Anion gap 12 5 - 15  cbc     Status: Abnormal   Collection Time: 08/26/15  6:22 PM  Result Value Ref Range   WBC 9.7 4.0 - 10.5 K/uL   RBC 4.74 3.87 - 5.11 MIL/uL   Hemoglobin 15.9 (H) 12.0 - 15.0 g/dL   HCT 46.4 (H) 36.0 - 46.0 %   MCV 97.9 78.0 - 100.0 fL   MCH 33.5 26.0 - 34.0 pg   MCHC 34.3 30.0 - 36.0 g/dL   RDW 14.0 11.5 - 15.5 %   Platelets 335 150 - 400 K/uL  Ethanol     Status: None   Collection Time: 08/26/15  6:23 PM  Result Value Ref Range   Alcohol, Ethyl (B) <5 <5 mg/dL    Comment:        LOWEST DETECTABLE LIMIT FOR SERUM ALCOHOL IS 5 mg/dL FOR MEDICAL PURPOSES ONLY   Salicylate level     Status: None   Collection Time: 08/26/15  6:23 PM  Result Value Ref Range   Salicylate Lvl <6.4 2.8 - 30.0 mg/dL  Acetaminophen level     Status: Abnormal   Collection Time: 08/26/15  6:23 PM  Result Value Ref Range   Acetaminophen (Tylenol), Serum <10 (L) 10 - 30 ug/mL    Comment:        THERAPEUTIC CONCENTRATIONS VARY SIGNIFICANTLY. A RANGE OF 10-30 ug/mL MAY BE AN EFFECTIVE CONCENTRATION FOR MANY PATIENTS. HOWEVER, SOME ARE BEST TREATED AT CONCENTRATIONS OUTSIDE THIS RANGE. ACETAMINOPHEN CONCENTRATIONS >150 ug/mL AT 4 HOURS AFTER INGESTION AND >50 ug/mL AT 12 HOURS AFTER INGESTION ARE OFTEN ASSOCIATED WITH TOXIC REACTIONS.  Current Facility-Administered Medications  Medication Dose Route Frequency Provider Last Rate Last Dose  . aspirin EC tablet 81 mg  81 mg Oral Daily Wandra Arthurs, MD   81 mg at 08/27/15 0919  . busPIRone (BUSPAR) tablet 10 mg  10 mg Oral BID Corena Pilgrim, MD   10 mg at 08/27/15 1122  . ferrous fumarate (HEMOCYTE - 106 mg FE) tablet 106 mg of iron  1 tablet Oral Daily Wandra Arthurs, MD   106 mg of iron  at 05/03/30 3557  . folic acid (FOLVITE) tablet 1 mg  1 mg Oral Daily Wandra Arthurs, MD   1 mg at 08/27/15 3220  . levothyroxine (SYNTHROID, LEVOTHROID) tablet 100 mcg  100 mcg Oral QAC breakfast Wandra Arthurs, MD   100 mcg at 08/27/15 0754  . lisinopril (PRINIVIL,ZESTRIL) tablet 10 mg  10 mg Oral Daily Wandra Arthurs, MD   10 mg at 08/27/15 2542  . LORazepam (ATIVAN) tablet 1 mg  1 mg Oral Q6H PRN Wandra Arthurs, MD   1 mg at 08/27/15 0756   Or  . LORazepam (ATIVAN) injection 1 mg  1 mg Intravenous Q6H PRN Wandra Arthurs, MD      . LORazepam (ATIVAN) tablet 0-4 mg  0-4 mg Oral Q6H Wandra Arthurs, MD   0 mg at 08/26/15 2153   Followed by  . [START ON 08/28/2015] LORazepam (ATIVAN) tablet 0-4 mg  0-4 mg Oral Q12H Wandra Arthurs, MD      . mometasone-formoterol [facility]) 200-5 MCG/ACT inhaler 2 puff  2 puff Inhalation BID Wandra Arthurs, MD   2 puff at 08/27/15 0756  . multivitamin with minerals tablet 1 tablet  1 tablet Oral Daily Wandra Arthurs, MD   1 tablet at 08/27/15 [phone]  . nicotine (NICODERM CQ - dosed in mg/24 hours) patch 21 mg  21 mg Transdermal Daily Gareth Morgan, MD   21 mg at 08/27/15 3762  . simvastatin (ZOCOR) tablet 10 mg  10 mg Oral QHS Wandra Arthurs, MD   10 mg at 08/26/15 2153  . thiamine (VITAMIN B-1) tablet 100 mg  100 mg Oral Daily Wandra Arthurs, MD   100 mg at 08/27/15 8315   Or  . thiamine (B-1) injection 100 mg  100 mg Intravenous Daily Wandra Arthurs, MD      . venlafaxine XR [facility]) 24 hr capsule 225 mg  225 mg Oral Q breakfast Wandra Arthurs, MD   225 mg at 08/27/15 0920   Current Outpatient Prescriptions  Medication Sig Dispense Refill  . ALPRAZolam (XANAX) 0.5 MG tablet Take one tab po qAM and noon. Take 12m (2 tabs) po qHS prn insomnia (Patient taking differently: Take 0.5 mg by mouth 4 (four) times daily. ) 360 tablet 0  . aspirin 81 MG tablet Take 81 mg by mouth daily.    . budesonide-formoterol (SYMBICORT) 160-4.5 MCG/ACT inhaler Inhale 2 puffs into the lungs 2 (two) times daily. 1 Inhaler  6  . buPROPion (WELLBUTRIN SR) 200 MG 12 hr tablet Take 1 tablet (200 mg total) by mouth 2 (two) times daily. 180 tablet 0  . Cyanocobalamin (VITAMIN B-12 PO) Take 1 tablet by mouth daily.    . ferrous fumarate (HEMOCYTE - 106 MG FE) 325 (106 FE) MG TABS Take 1 tablet by mouth daily.     .Marland Kitchenlevothyroxine (SYNTHROID, LEVOTHROID) 100 MCG tablet Take 1 tablet (100 mcg total) by mouth daily before breakfast. 30  tablet 0  . lisinopril (PRINIVIL,ZESTRIL) 10 MG tablet Take 1 tablet (10 mg total) by mouth daily. 30 tablet 0  . Magnesium 250 MG TABS Take 250 mg by mouth 2 (two) times daily.     . simvastatin (ZOCOR) 10 MG tablet Take 1 tablet (10 mg total) by mouth at bedtime. 90 tablet 0  . venlafaxine XR (EFFEXOR-XR) 75 MG 24 hr capsule Take 3 capsules (225 mg total) by mouth daily with breakfast. 270 capsule 0  . acetaminophen (TYLENOL) 325 MG tablet Take 2 tablets (650 mg total) by mouth every 6 (six) hours as needed for mild pain (or Temp > 100). (Patient not taking: Reported on 08/26/2015)    . alendronate (FOSAMAX) 70 MG tablet Take 70 mg by mouth once a week. Takes on Tues.    . Cholecalciferol (VITAMIN D3) 50000 UNITS CAPS Take 1 capsule by mouth every 7 (seven) days. 12 capsule 3    Musculoskeletal: Strength & Muscle Tone: within normal limits Gait & Station: normal Patient leans: N/A  Psychiatric Specialty Exam: Review of Systems  Constitutional: Negative.   HENT: Negative.   Eyes: Negative.   Respiratory: Negative.   Cardiovascular: Negative.   Gastrointestinal: Negative.   Genitourinary: Negative.   Musculoskeletal: Negative.   Skin: Negative.   Neurological: Negative.   Endo/Heme/Allergies: Negative.     Blood pressure 114/53, pulse 71, temperature 97.8 F (36.6 C), temperature source Oral, resp. rate 16, SpO2 100 %.There is no weight on file to calculate BMI.  General Appearance: Casual and Fairly Groomed  Engineer, water::  Good  Speech:  Clear and Coherent and Normal Rate   Volume:  Normal  Mood:  Anxious and Depressed  Affect:  Congruent, Depressed and Tearful  Thought Process:  Coherent, Goal Directed and Intact  Orientation:  Full (Time, Place, and Person)  Thought Content:  WDL  Suicidal Thoughts:  No  Homicidal Thoughts:  No  Memory:  Immediate;   Good Recent;   Good Remote;   Good  Judgement:  Good  Insight:  Good  Psychomotor Activity:  Tremor  Concentration:  Good  Recall:  NA  Fund of Knowledge:Good  Language: Good  Akathisia:  NA  Handed:  Right  AIMS (if indicated):     Assets:  Desire for Improvement  ADL's:  Intact  Cognition: WNL  Sleep:      Treatment Plan Summary: Daily contact with patient to assess and evaluate symptoms and progress in treatment and Medication management  Disposition:  Accepted for admission and we will be seeking placement at any facility with available bed.  Patient is placed on CIWA protocol using Ativan for her detox treatment.  We have resumed her home medications with the exception of Wellbutrin.  We have prescribed   Delfin Gant, NP 08/27/2015 2:57 PM Patient seen face-to-face for psychiatric evaluation, chart reviewed and case discussed with the physician extender and developed treatment plan. Reviewed the information documented and agree with the treatment plan. Corena Pilgrim, MD

## 2015-08-27 NOTE — ED Notes (Signed)
Dr a and Reginold Agent NP into see

## 2015-08-27 NOTE — ED Notes (Signed)
On the phone 

## 2015-08-27 NOTE — ED Notes (Signed)
Reports chills and trouble resting. Tremulous.

## 2015-08-27 NOTE — BH Assessment (Signed)
[facility] Assessment Progress Note  Per Corena Pilgrim, MD, this pt continues to require psychiatric hospitalization.  The following facilities have been contacted to seek placement for this pt, with results as noted:  Beds available, information sent, decision pending:  [facility]   At capacity:  [facility]   Jalene Mullet, [place] Triage Specialist [phone]

## 2015-08-28 DIAGNOSIS — F331 Major depressive disorder, recurrent, moderate: Secondary | ICD-10-CM | POA: Diagnosis not present

## 2015-08-28 NOTE — BH Assessment (Signed)
Pt has been accepted to [facility]. Pt can be transported after 9 am. Pt will be going to the [facility]. Dr. Emeline Darling is the accepting doctor. The number for nurse report is [phone].

## 2015-08-28 NOTE — ED Notes (Signed)
Pt felt better after receiving Ativan for her withdrawal symptoms and felt ready to travel. Her symptoms included hand and arm tremors and anxiety. She said that she drank about 4 bourbons and a glass of wine a night for some time now. She and her husband are helping to raise 3 of their grandchildren (youngest 69 y/o) and have joint custody because her daughter suffers form Bipolar and Schizophrenia and is not capable of caring for her children. She was appreciative of the care that we gave her here. STransported to [facility] by [employer].

## 2015-09-04 ENCOUNTER — Telehealth (HOSPITAL_COMMUNITY): Payer: Self-pay

## 2015-09-04 NOTE — Telephone Encounter (Signed)
Patient called, she said she just got out of [facility] - they took her off of the Xanax and were giving her Trazodone for sleep. They also added Buspar, decreased Effexor to 1 a day and decreased the wellbutrin to 100 mg a day. When she left the facility they gave her prescriptions for everything but the Trazodone, she would like to know if she can get a prescription from you. Please review and advise, thank you

## 2015-09-09 ENCOUNTER — Encounter (HOSPITAL_COMMUNITY): Payer: Self-pay | Admitting: Clinical

## 2015-09-09 ENCOUNTER — Ambulatory Visit (INDEPENDENT_AMBULATORY_CARE_PROVIDER_SITE_OTHER): Payer: 59 | Admitting: Clinical

## 2015-09-09 DIAGNOSIS — F331 Major depressive disorder, recurrent, moderate: Secondary | ICD-10-CM

## 2015-09-09 DIAGNOSIS — F411 Generalized anxiety disorder: Secondary | ICD-10-CM

## 2015-09-10 ENCOUNTER — Ambulatory Visit (HOSPITAL_COMMUNITY): Payer: 59 | Admitting: Psychology

## 2015-09-10 ENCOUNTER — Other Ambulatory Visit (HOSPITAL_COMMUNITY): Payer: Self-pay

## 2015-09-10 MED ORDER — TRAZODONE HCL 100 MG PO TABS: 100.0000 mg | ORAL_TABLET | Freq: Every day | ORAL | Status: DC

## 2015-09-10 NOTE — Telephone Encounter (Signed)
We have no records from [facility]. Pt will need to contact them to get script.

## 2015-09-10 NOTE — Telephone Encounter (Signed)
Patient was here with Lelon Frohlich for an appointment, Lelon Frohlich is going to speak to Dr. Doyne Keel about the trazodone.

## 2015-09-10 NOTE — Telephone Encounter (Signed)
Per Dr. Doyne Keel I sent an order to the pharmacy for Trazodone 100 mg 1 po qhs for 4 days. Patient is coming in on Monday and if Kendra Ball wants to do any changes, they can be done at that visit.

## 2015-09-11 ENCOUNTER — Encounter (HOSPITAL_COMMUNITY): Payer: Self-pay | Admitting: Psychology

## 2015-09-11 DIAGNOSIS — F1020 Alcohol dependence, uncomplicated: Secondary | ICD-10-CM | POA: Insufficient documentation

## 2015-09-11 NOTE — Progress Notes (Signed)
Kendra Ball is a 70 y.o. female patient. CD-IOP: Orientation to CD-IOP. The patient is a 70 y.o married, white, female referred by her physician for treatment to address her alcohol use. She was discharged from [facility] on May 10th after 5 days of detox and stabilization. She lives in [place] with her husband. The patient had her first drink of alcohol at age 24. She was a social drinker for almost all of her life. Approximately 8 months ago she became significantly more anxious and depressed and her alcohol use increased. The patient noted that she has struggled with depression for almost 30 years and that her depression got much worse in the winter months (SAD). Her drinking began earlier and earlier in the day unti she was drinking something after getting up in the mornings. The patient reported drinking about 1/2 of a large bottle of wine followed by 3-4 bourbon and coke drinks with a few beers sometimes. Looking back, she admitted, "I couldn't stop". Her 2 daughters and husband became concerned as they watched this woman suddenly ingesting large amounts of alcohol when she had never been anything beyond a social drinker in the past. It was so "unlike her". A 'mini' intervention was held on May 3rd and she was taken by family to the ED. The patient was subsequently transferred to [facility] in [place]. The patient denied any other drug use and reported that she was prescribed some pain medication when she broke her arm in 2007, but she experienced mood changes and became very angry and irritable and stopped taking them immediately. She was prescribed Xanax .5 mg QID for about 10 years and didn't abuse them, but admitted that when combined with the alcohol, it increased her impairment. She has since been detoxed off the Xanax. The patient reports a maternal uncle who was an alcoholic, but is unable to identify any others in her family history. The patient has 3 children and the youngest, a  86 yo daughter, has struggle with addiction and Bipolar Disorder. Their daughter had 2 children while using and about 10 years ago, the patient and her husband took over much of the care for these 2 grandchildren. Her middle child and only son, grew very resentful and angry towards them and pointed out the discrepancies in their care and attention towards those grandchildren in comparison to his own 2 children.  Four years ago, he decided to split entirely from the family. Since then, he, his wife and children have had little contact with members of his family of origin or aunts, uncles and cousins. This has proven to be a very painful experience for the patient and the rest of the family. While the youngest daughter has remarried and is a more stable, the chidren (ages  30 and 23) still spend significant time with their grandparents. The patient reported a childhood that included moving many times over the course of her early years. At the age of 70, her father died of a massive heart attack. He had had heart problems, but his sudden death proved devastating to the family. They were living in [place] state at the time, but within a month, the patient's mother had put them on a train and shipped her 5 children back to [place] where she had grown up and had family. The patient's siblings remained there and she and her husband spend summers up in [place] at a campgrounds on the [place]. She has many fond memories of their times  there. The patient was very candid about her alcohol use and reported she doesn't ever want to return to those awful days where she was out of control. The patient reported her husband doesn't drink and his sister and brother-in-law are scheduled to arrive for a visit from [place] later today. Her sister-in-law is a recovering alcoholic, attends AA meetings regularly and has been sober for 31+ years. She will be a good source of support for this patient. The documentation was  reviewed, signed and the orientation completed accordingly. The patient will return on Monday, May 22 and begin the CD-IOP.    [name], LCAS

## 2015-09-14 ENCOUNTER — Other Ambulatory Visit (HOSPITAL_COMMUNITY): Payer: Self-pay | Admitting: Medical

## 2015-09-14 ENCOUNTER — Other Ambulatory Visit (HOSPITAL_COMMUNITY): Payer: Medicare Other | Admitting: Psychology

## 2015-09-14 ENCOUNTER — Other Ambulatory Visit (HOSPITAL_COMMUNITY): Payer: Self-pay | Admitting: Psychology

## 2015-09-14 DIAGNOSIS — Z811 Family history of alcohol abuse and dependence: Secondary | ICD-10-CM | POA: Insufficient documentation

## 2015-09-14 DIAGNOSIS — F17200 Nicotine dependence, unspecified, uncomplicated: Secondary | ICD-10-CM

## 2015-09-14 DIAGNOSIS — F17210 Nicotine dependence, cigarettes, uncomplicated: Secondary | ICD-10-CM | POA: Insufficient documentation

## 2015-09-14 DIAGNOSIS — F341 Dysthymic disorder: Secondary | ICD-10-CM | POA: Insufficient documentation

## 2015-09-14 DIAGNOSIS — F1020 Alcohol dependence, uncomplicated: Secondary | ICD-10-CM | POA: Diagnosis not present

## 2015-09-14 DIAGNOSIS — F411 Generalized anxiety disorder: Secondary | ICD-10-CM | POA: Diagnosis not present

## 2015-09-14 HISTORY — DX: Nicotine dependence, unspecified, uncomplicated: F17.200

## 2015-09-14 MED ORDER — TRAZODONE HCL 150 MG PO TABS: 150.0000 mg | ORAL_TABLET | Freq: Every day | ORAL | Status: DC

## 2015-09-14 NOTE — Progress Notes (Signed)
   THERAPIST PROGRESS NOTE  Session Time: 2:30 -3:25  Participation Level: Active  Behavioral Response: CasualAlertNA   Type of Therapy: Individual Therapy  Treatment Goals addressed: Improve psychiatric symptoms, Elevate mood,  Improve unhelpful thought patterns, decrease irrational worries and fears, interpersonal relationship skills ( desire and ability to interact with those she loves and cares about)  Interventions: CBT and Motivational Interviewing, Grounding and Mindfulness Techniques, psychoeducation  Summary: Kendra Ball is a 70 y.o. female who presents with Major Depressive Disorder, recurrent, moderate and Generalized Anxiety Disorder  Suicidal/Homicidal: No -without intent/plan  Therapist Response:  Kendra Ball met with clinician for an individual session. Kendra Ball discussed her psychiatric symptoms, and her current life events. Kendra Ball shared that she was admitted to [facility] ( for alcohol detox )  and is beginning IOP this week. She shared that as difficult as it was to go she felt grateful to family and clinician encouraging her and supporting her in going to recieve treatment. Client and clinician discussed her family's love and support for her.  Kendra Ball shared about her medication adjustment and her thoughts. Clinician asked open ended questions and Kendra Ball shared  insights about her alcohol abuse. She shared about self medicating for her depression and anxiety. Client and clinician discussed how the drinking affected her mental health issues. Client and clinician discussed the process of experiencing and expressing her emotions. Kendra Ball shared that she is looking forward to IOP and then returning to individual therapy   Plan: Return again in 1 -2 weeks.  Diagnosis: Axis I: Major Depressive Disorder, recurrent, moderate and Generalized Anxiety Disorder     Berenise Hunton A, LCSW 09/09/2015

## 2015-09-14 NOTE — Progress Notes (Signed)
Psychiatric Initial Adult Assessment   Patient Identification: Kendra Ball MRN:  [id number] Date of Evaluation:  09/14/2015 Referral Source:Dr Argaywal and Tharon Aquas Chief Complaint:Moderate Alcohol dependence. In background of mood DO (anxious depression)  Visit Diagnosis:   1.   Alcohol use disorder, moderate, dependence (HCC)    303.90 F10.20        2.   History of gastric bypass    V45.86 Z98.890     3.   Insomnia    780.52 G47.00       4.   Dysthymia    300.4 F34.1     5.   Generalized anxiety disorder    300.02 F41.1    6.   Cigarette nicotine dependence without complication    270.3 J00.938     7.   COPD (chronic obstructive pulmonary disease) with chronic bronchitis (HCC)    491.20 J44.9       8.   Family hx of alcoholism    V17.0 Z81.1    History of Present Illness:  70 yo WF 12 yrs AS/P Gastric bypass who 8 mos experienced the onset of uncontrolled progressive alcohol use requiring medical detoxification for DSM V moderate alcohol use disorder,dependence. Prior to this period she was a social drinker in every sense of the word. She has a son who has disowned her 5 yrs ago due to what he perceived to be favoritism toward his sick younger children and her children which is painful to her.She relates to a 20 yr history of anxiety beginning with her younger daughter's battles with mental illness-bipolar and now schizo symptoms-she and her husband have joint custody as a result.The anxiety progressed into depression dermatitis She has been treated mainly by PCPs but this past December she came to [facility] and engaged Dr Hayes Ludwig and Tharon Aquas.Her family intervened on her in May as a result of the uncontrolled consumption of large qi uantities of alcohol they werew itnessing and she entered [facility] for detoxification Aug 28 2015. She was discharged ion the 10th of May with referral to [facility] CD IOP. She met with Brandon Melnick LCAS 09/11/2015: CD-IOP: Orientation to CD-IOP. The patient is a 70  yo married, white, female referred by her physician for treatment to address her alcohol use. She was discharged from [facility] on May 10th after 5 days of detox and stabilization. She lives in [place] with her husband. The patient had her first drink of alcohol at age 45. She was a social drinker for almost all of her life. Approximately 8 months ago she became significantly more anxious and depressed and her alcohol use increased. The patient noted that she has struggled with depression for almost 30 years and that her depression got much worse in the winter months (SAD). Her drinking began earlier and earlier in the day unti she was drinking something after getting up in the mornings. The patient reported drinking about 1/2 of a large bottle of wine followed by 3-4 bourbon and coke drinks with a few beers sometimes. Looking back, she admitted, "I couldn't stop". Her 2 daughters and husband became concerned as they watched this woman suddenly ingesting large amounts of alcohol when she had never been anything beyond a social drinker in the past. It was so "unlike her". A 'mini' intervention was held on May 3rd and she was taken by family to the ED. The patient was subsequently transferred to [facility] in [place]. The patient denied any other drug use and reported that she  was prescribed some pain medication when she broke her arm in 2007, but she experienced mood changes and became very angry and irritable and stopped taking them immediately. She was prescribed Xanax .5 mg QID for about 10 years and didn't abuse them, but admitted that when combined with the alcohol, it increased her impairment. She has since been detoxed off the Xanax. The patient reports a maternal uncle who was an alcoholic, but is unable to identify any others in her family history. The patient has 3 children and the youngest, a 21 yo daughter, has struggle with addiction and Bipolar Disorder. Their daughter had 2 children while  using and about 10 years ago, the patient and her husband took over much of the care for these 2 grandchildren. Her middle child and only son, grew very resentful and angry towards them and pointed out the discrepancies in their care and attention towards those grandchildren in comparison to his own 2 children.  Four years ago, he decided to split entirely from the family. Since then, he, his wife and children have had little contact with members of his family of origin or aunts, uncles and cousins. This has proven to be a very painful experience for the patient and the rest of the family. While the youngest daughter has remarried and is a more stable, the chidren (ages   41 and 86) still spend significant time with their grandparents. The patient reported a childhood that included moving many times over the course of her early years. At the age of 70, her father died of a massive heart attack. He had had heart problems, but his sudden death proved devastating to the family. They were living in [place] state at the time, but within a month, the patient's mother had put them on a train and shipped her 5 children back to [place] where she had grown up and had family. The patient's siblings remained there and she and her husband spend summers up in [place] at a campgrounds on the [place]. She has many fond memories of their times there. The patient was very candid about her alcohol use and reported she doesn't ever want to return to those awful days where she was out of control. The patient reported her husband doesn't drink and his sister and brother-in-law are scheduled to arrive for a visit from [place] later today. Her sister-in-law is a recovering alcoholic, attends AA meetings regularly and has been sober for 31+ years. She will be a good source of support for this patient. The documentation was reviewed, signed and the orientation completed accordingly. The patient will return on Monday, May 22 and  begin the CD-IOP.   [name],[name], LCAS        In spaeking with herv today she is joyful at being renergized after detox. Her depression is 2/10 vs 10/10 entering detox.D She rates her anxiety the same.She has run out of Trazodone and is requesting refill.She has stopped taking all benzodiazepenes. She is glad to be in the CD IOP program  Associated Signs/Symptoms: MDQ +/GAD 7 score 7 mild anxiety;Cage 4/4 yes;AUDIT suggest dependence Depression Symptoms:   depressed mood, anhedonia, loss of energy/fatigue, disturbed sleep, PHQ 9 score 9 all level 1 except sleep level 3 (Hypo) Manic Symptoms:   Irritable Mood, Labiality of Mood, Anxiety Symptoms:  Excessive Worry, Psychotic Symptoms:  None PTSD Symptoms: Had a traumatic exposure:  son disowned her and family5 yrs ago Re-experiencing:  Intrusive Thoughts Hypervigilance:  Negative Hyperarousal:  Irritability/Anger Avoidance:  None misses contact  Past Psychiatric History: Past Psychiatric History: Diagnosis: 20 yrs anxious depression;2017 Alcohol dependence and MDD recurrent  Hospitalizations: [facility] 5/5-5/01/2016  Outpatient Care: [facility] Xanax and Zoloft 20 yrs ago-continued off and on most recently [facility] OP Dr Perlie Gold  And Frankie12/2016  Substance Abuse Care: May 2017 [facility]  Self-Mutilation: NA  Suicidal Attempts: NONE  Violent Behaviors: NONE    Previous Psychotropic Medications: Yes Zoloft;Xanax;Wellbutrin;Lorazepam.Effexor Substance Abuse History in the last 12 months:  Yes.    Substance Abuse History in the last 12 months: Substance Age of 1st Use Last Use Amount Specific Type  Nicotine      Alcohol      Cannabis      Opiates      Cocaine      Methamphetamines      LSD      Ecstasy      Benzodiazepines      Caffeine      Inhalants      Others:                           Consequences of Substance Abuse: Medical Consequences:  Hospital detox Legal Consequences:  NA Family Consequences:   Concern/intervention Blackouts:  Yes DT's: No Withdrawal Symptoms:   Diaphoresis Headaches Nausea Tremors Craving  Past Medical History:  Past Medical History  Diagnosis Date  . DEPRESSION 08/26/2008  . HYPERTENSION 08/26/2008  . HYPOTHYROIDISM 08/26/2008  . OSTEOPENIA 08/26/2008  . TOBACCO USER 12/25/2009  . Obesity   . Hx of colonic polyps     First noted on  colonoscopy 2012  . Hypercholesteremia     Past Surgical History  Procedure Laterality Date  . Gastric bypass    . Cesarean section    . Dilation and curettage of uterus      x2  . Forearm fracture surgery      right  . Tonsillectomy    . Colon resection N/A 05/09/2014    Procedure: LAPAROSCOPIC HAND-ASSISTED EXTENDED RIGHT COLECTOMY, LAPAROSCOPIC LYSIS OF ADHESIONS, SPLENIC FLEXURE MOBILIZATION.;  Surgeon: [name], MD;  Location: [facility] ORS;  Service: General;  Laterality: N/A;   Developmental History:Negative Prenatal History:  Birth History: Postnatal Infancy:  Developmental History:  Milestones:Normal  Sit-Up:   Crawl:   Walk:   Speech:  Family Psychiatric History: Daughter Bipolar;Schizoaffective;Depression (sisters and mother)  Family History:  Family History  Problem Relation Age of Onset  . Cancer Mother     colon  . Dementia Mother   . Depression Mother   . Heart disease Father 33  . Heart disease Sister 59    stents, pacemaker  . Depression Sister   . Heart disease Brother 63    CABG, MI  . Depression Brother   . Obesity Daughter   . Suicidality Daughter   . Bipolar disorder Daughter   . Obesity Son   . Mental illness Daughter     bipolar  . Obesity Daughter   . Dementia Paternal Aunt   . Depression Sister   . Alcohol abuse Maternal Uncle     Social History:   Social History   Social History  . Marital Status: Married    Spouse Name: N/A  . Number of Children: 3  . Years of Education: N/A   Occupational History  . RETIRED    Social History Main Topics  . Smoking status:  Current Every Day Smoker -- 1.00 packs/day for  50 years    Types: Cigarettes  . Smokeless tobacco: Never Used  . Alcohol Use: 15.6 oz/week    10 Glasses of wine, 16 Shots of liquor per week     Comment: 3-4 nights a week she will have 3-4 bourbens or 1 glass of wine  . Drug Use: No  . Sexual Activity: No   Other Topics Concern  . Not on file   Social History Narrative   Ms. Sen lives in [place] with her husband. She has 3 grown children and several grand children. She has joint custody of 3 of her grand children as her daughter has bipolar disorder and has needed assistance with children in past.    Additional Social History:   Current Place of Residence: see narrative Place of Birth: [place] Family Members: M,F deceased F 68 MI M 76 Sepsis B 1 67; 1 Step B 79 diabetic; 3 sisters 15 good health;59 ASCVD pacemaker,stents;52 good health Marital Status:  Married Husband retired Media planner Children: 3  Sons: 1-47 estranged  Daughters: 2-49 good health;46 Bipolar /schizo [place] custody of 3 children youngest daughter;Son 2 grandchildren-has cut off communication due to perceived favoritism to his sister and her children;older daughter has 1 boy/1 girl  Relationships: as above Education:  HS Graduate Some college Educational Problems/Performance: 3.5 Religious Beliefs/Practices: Christian History of Abuse: none Occupational Experiences;[employer]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 Morphine Sulfate     Swelling around injection area    Metabolic Disorder Labs: Lab Results  Component Value Date   HGBA1C 5.5 05/10/2014   MPG 111 05/10/2014   No results found for: PROLACTIN Lab Results  Component Value Date   CHOL 191 09/10/2014   TRIG 120.0 09/10/2014   HDL 83.80 09/10/2014    CHOLHDL 2 09/10/2014   VLDL 24.0 09/10/2014   LDLCALC 83 09/10/2014   LDLCALC 75 03/12/2014     Current Medications: Current Outpatient Prescriptions  Medication Sig Dispense Refill  . acetaminophen (TYLENOL) 325 MG tablet Take 2 tablets (650 mg total) by mouth every 6 (six) hours as needed for mild pain (or Temp > 100).    [name] alendronate (FOSAMAX) 70 MG tablet Take 70 mg by mouth once a week. Reported on 09/11/2015    . ALPRAZolam (XANAX) 0.5 MG tablet Take one tab po qAM and noon. Take 60m (2 tabs) po qHS prn insomnia (Patient not taking: Reported on 09/09/2015) 360 tablet 0  . aspirin 81 MG tablet Take 81 mg by mouth daily.    . budesonide-formoterol (SYMBICORT) 160-4.5 MCG/ACT inhaler Inhale 2 puffs into the lungs 2 (two) times daily. (Patient not taking: Reported on 09/11/2015) 1 Inhaler 6  . buPROPion (WELLBUTRIN SR) 200 MG 12 hr tablet Take 1 tablet (200 mg total) by mouth 2 (two) times daily. 180 tablet 0  . Cholecalciferol (VITAMIN D3) 50000 UNITS CAPS Take 1 capsule by mouth every 7 (seven) days. 12 capsule 3  . Cyanocobalamin (VITAMIN B-12 PO) Take 1 tablet by mouth daily.    . ferrous fumarate (HEMOCYTE - 106 MG FE) 325 (106 FE) MG TABS Take 1 tablet by mouth daily.     .Marland Kitchenlevothyroxine (SYNTHROID, LEVOTHROID) 100 MCG tablet Take 1 tablet (100 mcg total) by mouth daily before breakfast. 30 tablet 0  . lisinopril (PRINIVIL,ZESTRIL) 10 MG tablet Take 1 tablet (10 mg total) by  mouth daily. 30 tablet 0  . Magnesium 250 MG TABS Take 250 mg by mouth 2 (two) times daily.     . simvastatin (ZOCOR) 10 MG tablet Take 1 tablet (10 mg total) by mouth at bedtime. 90 tablet 0  . traZODone (DESYREL) 150 MG tablet Take 1 tablet (150 mg total) by mouth at bedtime. 30 tablet 2  . venlafaxine XR (EFFEXOR-XR) 75 MG 24 hr capsule Take 3 capsules (225 mg total) by mouth daily with breakfast. 270 capsule 0   No current facility-administered medications for this visit.    Neurologic: Headache:  Negative Seizure: Negative Paresthesias:Negative  Musculoskeletal: Strength & Muscle Tone: within normal limits Gait & Station: normal Patient leans: N/A  Psychiatric Specialty Exam: Review of Systems  Constitutional: Negative for fever, chills, weight loss, malaise/fatigue and diaphoresis.  HENT: Negative for congestion, ear discharge, ear pain, hearing loss, nosebleeds, sore throat and tinnitus.   Eyes: Negative for blurred vision, double vision, photophobia, pain, discharge and redness.  Respiratory: Positive for shortness of breath (Exertional SOB). Negative for cough, hemoptysis, sputum production, wheezing and stridor.   Cardiovascular: Positive for palpitations (when anxious). Negative for chest pain, orthopnea, claudication, leg swelling and PND.  Gastrointestinal: Positive for diarrhea (Magnesium induced). Negative for heartburn, nausea, vomiting, abdominal pain, constipation, blood in stool and melena.       Gastric Bypass 2005  Genitourinary: Negative for dysuria, urgency, frequency, hematuria and flank pain.  Musculoskeletal: Positive for myalgias and joint pain (Lt hip/). Negative for back pain and neck pain.       Osteoporosis  Skin: Negative for itching and rash.  Neurological: Positive for focal weakness (RT hnd s/p fx with ORIF), loss of consciousness (Alcohol) and headaches (Campral? tolerable). Negative for dizziness, tingling, tremors, sensory change, speech change, seizures and weakness.  Endo/Heme/Allergies: Negative for environmental allergies and polydipsia. Bruises/bleeds easily.  Psychiatric/Behavioral: Positive for depression, memory loss and substance abuse. Negative for suicidal ideas and hallucinations. The patient is nervous/anxious and has insomnia.     There were no vitals taken for this visit.There is no weight on file to calculate BMI.  General Appearance: Fairly Groomed  Eye Contact:  Good  Speech:  Clear and Coherent  Volume:  Normal  Mood:   Variable  Affect:  Congruent and Full Range  Thought Process:  Coherent, Goal Directed and Descriptions of Associations: Intact  Orientation:  Full (Time, Place, and Person)  Thought Content:  Negative and WDL  Suicidal Thoughts:  No  Homicidal Thoughts:  No  Memory:  Negative HX OF BLACKOUTS  Judgement:  Fair  Insight:  Fair  Psychomotor Activity:  Normal  Concentration:  Concentration: Good and Attention Span: Good  Recall:  [place] of Knowledge:Good  Language: Good  Akathisia:  NA  Handed:  Right  AIMS (if indicated):  NA  Assets:  Desire for Improvement Financial Resources/Insurance Housing Resilience Social Support Vocational/Educational  ADL's:  Intact  Cognition: WNL  Sleep:  Requires Trazodone   Treatment Plan/Recommendations:  Plan of Care: [place] model  Laboratory:  UDS per protocol  Psychotherapy: CD IOP Group and individual;Family  Medications: See list  Routine PRN Medications:  NA  Consultations: No  Safety Concerns:  Relapse  Other:  None     Darlyne Russian, PA-C 5/22/20173:50 PM

## 2015-09-15 ENCOUNTER — Encounter (HOSPITAL_COMMUNITY): Payer: Self-pay | Admitting: Psychology

## 2015-09-15 NOTE — Progress Notes (Signed)
    Daily Group Progress Note  Program: CD-IOP   Group Time: 1-2:30 pm  Participation Level: Active  Behavioral Response: Appropriate and Sharing  Type of Therapy: Process Group  Topic: Process: the first half of group was spent in process. Members shared about the events of the past weekend and things they have done to support their recovery. Two new group members were present today and they introduced themselves and shared about their history of alcohol use. The program director met with two group members today. Random drug tests were collected from five group members.   Group Time: 2;45-4pm  Participation Level: Minimal  Behavioral Response: Appropriate  Type of Therapy: Psycho-education Group  Topic: Psycho-Ed: the second half of group was spent in a psycho-ed. The topic of this discussion was the reading for May 22nd from [employer] book, "The Language of Letting Go". The reading focused on preparing for the changes one is going to make or, as the reading described it, "reprogramming". This reprogramming included addressing old behaviors and beliefs that prevent change. Members took turns reading the entry and the discussion focused on those old behaviors and beliefs that they once embraced as true. The session was revealing and the two newest members commented on how comfortable they felt in this first session.   Summary: The patient was new to the group and arrived very attentive and engaged in the session. She listened patiently as her new fellow group members shared about the past weekend. When asked to introduce herself, the patient shared about her life and history of alcohol use. While she had drunk alcohol in her youth, after around age 69, she had a drink about 3-4 times per year. Things changed quickly about 8 months ago, the patient explained, as she began to experience an intensifying depression and growing anxiety. The patient reported, "I began to drink daily to  block everything I was feeling". The patient described herself as drinking in the evening and then looking at her watch and beginning to drink earlier in the day. Soon, she was beginning her drinking around 10-11 am. Her two daughters and husband held a 'mini-intervention and she was persuaded to go to the hospital. The patient did not hold back any of the details of her alcohol use and members nodded as she described her demise. During the psycho-ed, the patient met with the program director. She returned in time to witness members sharing about their 'programming' in their early lives. As the session ended, the patient exclaimed she had found this session very comfortable. The group had been very receptive and she had no anxiety about being here. The patient responded well to this first group session and her sobriety date is 5/4.    Family Program: Family present? No   Name of family member(s):   UDS collected: Yes Results: pending  AA/NA attended?: No, but she is very new to recovery  Sponsor?: No   Caidan Hubbert, LCAS

## 2015-09-16 ENCOUNTER — Other Ambulatory Visit (HOSPITAL_COMMUNITY): Payer: Medicare Other | Attending: Psychiatry | Admitting: Psychology

## 2015-09-16 DIAGNOSIS — F1020 Alcohol dependence, uncomplicated: Secondary | ICD-10-CM

## 2015-09-17 ENCOUNTER — Other Ambulatory Visit (HOSPITAL_COMMUNITY): Payer: Medicare Other | Admitting: Psychology

## 2015-09-17 ENCOUNTER — Encounter (HOSPITAL_COMMUNITY): Payer: Self-pay | Admitting: Psychology

## 2015-09-17 DIAGNOSIS — F1020 Alcohol dependence, uncomplicated: Secondary | ICD-10-CM

## 2015-09-17 LAB — ETHYL GLUCURONIDE, URINE: Ethyl Glucuronide Screen, Ur: NEGATIVE ng/mL

## 2015-09-17 LAB — PRESCRIPTION ABUSE MONITORING 17P, URINE
6-Acetylmorphine, Urine: NEGATIVE ng/mL
Amphetamine Scrn, Ur: NEGATIVE ng/mL
BARBITURATE SCREEN URINE: NEGATIVE ng/mL
BENZODIAZEPINE SCREEN, URINE: NEGATIVE ng/mL
Buprenorphine, Urine: NEGATIVE ng/mL
CANNABINOIDS UR QL SCN: NEGATIVE ng/mL
Carisoprodol/Meprobamate, Ur: NEGATIVE ng/mL
Cocaine (Metab) Scrn, Ur: NEGATIVE ng/mL
Creatinine(Crt), U: 21.4 mg/dL (ref 20.0–300.0)
EDDP, Urine: NEGATIVE ng/mL
Fentanyl, Urine: NEGATIVE pg/mL
MDMA Screen, Urine: NEGATIVE ng/mL
Meperidine Screen, Urine: NEGATIVE ng/mL
Methadone Screen, Urine: NEGATIVE ng/mL
Nitrite Urine, Quantitative: NEGATIVE ug/mL
OXYCODONE+OXYMORPHONE UR QL SCN: NEGATIVE ng/mL
Opiate Scrn, Ur: NEGATIVE ng/mL
Ph of Urine: 5.9 (ref 4.5–8.9)
Phencyclidine Qn, Ur: NEGATIVE ng/mL
Propoxyphene Scrn, Ur: NEGATIVE ng/mL
SPECIFIC GRAVITY: 1.008
Tapentadol, Urine: NEGATIVE ng/mL
Tramadol Screen, Urine: NEGATIVE ng/mL

## 2015-09-17 NOTE — Progress Notes (Signed)
    Daily Group Progress Note  Program: CD-IOP   Group Time: 1-2:30  Participation Level: Active  Behavioral Response: Appropriate and Sharing  Type of Therapy: Process Group  Topic: Counselors met with patients for group process session. Session focused on group members' treatment goals of sobriety and increased social support for their recovery from drugs and alcohol. All patients were active and engaged in session. One member was absent and phoned at 1:15PM to say she was unable to travel to group due to car trouble. This was recorded as an unexcused absence. Random drug tests were collected from 2 patients. One new patient met with Investment banker, operational for majority of session.   Group Time: 2:45-4  Participation Level: Active  Behavioral Response: Appropriate and Sharing  Type of Therapy: Psycho-education Group  Topic: Counselors and Orthoptist met with patients for a group psychoed session. Session focused on core beliefs and how they contribute to patients' addiction. Patients were focused and open during session as chaplain led an experiential exploration of patient's values and ways they could challenge themselves to be "more open in group". Some members became significantly tearful when discussing past abuse. One member learned a new way to engage his S/O when conflict arises.    Summary: Patient presented as active and upbeat in group today. She reported that she had trouble sleeping since our last group due to her racing thoughts at night. The counselor reflected that this was a normal for the brain in early recovery from drugs or alcohol which seemed to bring her some level of comfort. She reported that her head feels like it is "on overload".  She reported that she attended 1 AA meeting since our last group which she enjoyed. She reported that she enjoyed our last group session (her first) and was looking forward to coming back for more help. Patient's sobriety date remains  5/4.   Family Program: Family present? No   Name of family member(s):   UDS collected: No Results: negative  AA/NA attended?: [place]  Sponsor?: No   Yannis Broce, LCAS

## 2015-09-18 ENCOUNTER — Encounter (HOSPITAL_COMMUNITY): Payer: Self-pay | Admitting: Psychology

## 2015-09-18 NOTE — Progress Notes (Signed)
    Daily Group Progress Note  Program: CD-IOP   Group Time: 1-2:30  Participation Level: Active  Behavioral Response: Appropriate and Sharing  Type of Therapy: Process Group  Topic: Counselor met with patients for group process session. Session lasted 1.5 hours and content focused on processing pt.'s experiences in recovery. Counselor inquired about emotions and thoughts related to sobriety and challenges that pt.'s are facing. One group member became significantly tearful and left the room when discussing suicide. The counselor went to check on her in the lobby. She eventually came back and processed her emotions with the group. One group member arrived 63 mins late due to work. A drug test was collected from this member as well.      Group Time: 2:45-4  Participation Level: Active  Behavioral Response: Appropriate and Sharing  Type of Therapy: Psycho-education Group  Topic: Counselor met with patients for group psycho-ed session. Session lasted 1.25 hours and content focused on specific recovery-planning for a long holiday weekend. The counselor provided a week-long hourly chart so pt.'s could plan a safe holiday weekend. Patients were given time to fill out the sheet and then each group member shared about their plans and any "weak points" in their plan and how they could seek extra help during those times.   Summary: Patient presented as active and engaged in session. Pt. reported that she slept much better last night and that her mind "finally turned off" and she was able to get restful sleep. Patient shared about her relationship with her husband who she identified as a "heavy drinker". When asked if her husband is supportive, pt. responded positively saying that her husband had poured out his bourbon and does not plan to drink while pt. is in early recovery. Patient revealed that she occasionally struggles with disbelief that she is an alcoholic since "it came on so fast". Her  sudden onset of alcoholism may coincide with her recent gastric bypass surgery due to her ability to metabolize alcohol. Patient was able to create specific plans for her holiday weekend. She revealed that she felt ashamed about her past when she would drink around her grandkids. Patient's sobriety date remains 5/4.   Family Program: Family present? NA   Name of family member(s):   UDS collected: No Results: negative  AA/NA attended?: No  Sponsor?: No   Vella Colquitt, LCAS

## 2015-09-23 ENCOUNTER — Other Ambulatory Visit (HOSPITAL_COMMUNITY): Payer: Self-pay | Admitting: Medical

## 2015-09-23 ENCOUNTER — Other Ambulatory Visit (HOSPITAL_COMMUNITY): Payer: Medicare Other | Admitting: Psychology

## 2015-09-23 DIAGNOSIS — F1020 Alcohol dependence, uncomplicated: Secondary | ICD-10-CM | POA: Diagnosis not present

## 2015-09-23 DIAGNOSIS — F331 Major depressive disorder, recurrent, moderate: Secondary | ICD-10-CM

## 2015-09-24 ENCOUNTER — Other Ambulatory Visit (HOSPITAL_COMMUNITY): Payer: 59 | Attending: Psychiatry | Admitting: Psychology

## 2015-09-24 DIAGNOSIS — J449 Chronic obstructive pulmonary disease, unspecified: Secondary | ICD-10-CM | POA: Insufficient documentation

## 2015-09-24 DIAGNOSIS — F1020 Alcohol dependence, uncomplicated: Secondary | ICD-10-CM | POA: Insufficient documentation

## 2015-09-24 DIAGNOSIS — F411 Generalized anxiety disorder: Secondary | ICD-10-CM | POA: Insufficient documentation

## 2015-09-24 DIAGNOSIS — F17210 Nicotine dependence, cigarettes, uncomplicated: Secondary | ICD-10-CM | POA: Insufficient documentation

## 2015-09-24 DIAGNOSIS — G4700 Insomnia, unspecified: Secondary | ICD-10-CM | POA: Diagnosis not present

## 2015-09-24 DIAGNOSIS — F331 Major depressive disorder, recurrent, moderate: Secondary | ICD-10-CM

## 2015-09-25 ENCOUNTER — Encounter (HOSPITAL_COMMUNITY): Payer: Self-pay | Admitting: Psychology

## 2015-09-25 LAB — PRESCRIPTION ABUSE MONITORING 17P, URINE
6-Acetylmorphine, Urine: NEGATIVE ng/mL
Amphetamine Scrn, Ur: NEGATIVE ng/mL
BARBITURATE SCREEN URINE: NEGATIVE ng/mL
BENZODIAZEPINE SCREEN, URINE: NEGATIVE ng/mL
Buprenorphine, Urine: NEGATIVE ng/mL
CANNABINOIDS UR QL SCN: NEGATIVE ng/mL
Carisoprodol/Meprobamate, Ur: NEGATIVE ng/mL
Cocaine (Metab) Scrn, Ur: NEGATIVE ng/mL
Creatinine(Crt), U: 17.2 mg/dL — ABNORMAL LOW (ref 20.0–300.0)
EDDP, Urine: NEGATIVE ng/mL
Fentanyl, Urine: NEGATIVE pg/mL
MDMA Screen, Urine: NEGATIVE ng/mL
Meperidine Screen, Urine: NEGATIVE ng/mL
Methadone Screen, Urine: NEGATIVE ng/mL
Nitrite Urine, Quantitative: NEGATIVE ug/mL
OXYCODONE+OXYMORPHONE UR QL SCN: NEGATIVE ng/mL
Opiate Scrn, Ur: NEGATIVE ng/mL
Ph of Urine: 6.3 (ref 4.5–8.9)
Phencyclidine Qn, Ur: NEGATIVE ng/mL
Propoxyphene Scrn, Ur: NEGATIVE ng/mL
SPECIFIC GRAVITY: 1.0042
Tapentadol, Urine: NEGATIVE ng/mL
Tramadol Screen, Urine: NEGATIVE ng/mL

## 2015-09-25 LAB — ETHYL GLUCURONIDE, URINE: Ethyl Glucuronide Screen, Ur: NEGATIVE ng/mL

## 2015-09-28 ENCOUNTER — Encounter (HOSPITAL_COMMUNITY): Payer: Self-pay | Admitting: Medical

## 2015-09-28 ENCOUNTER — Other Ambulatory Visit (HOSPITAL_COMMUNITY): Payer: Self-pay | Admitting: Medical

## 2015-09-28 ENCOUNTER — Encounter (HOSPITAL_COMMUNITY): Payer: Self-pay | Admitting: Psychology

## 2015-09-28 ENCOUNTER — Other Ambulatory Visit (HOSPITAL_COMMUNITY): Payer: 59 | Admitting: Psychology

## 2015-09-28 DIAGNOSIS — F341 Dysthymic disorder: Secondary | ICD-10-CM

## 2015-09-28 DIAGNOSIS — Z811 Family history of alcohol abuse and dependence: Secondary | ICD-10-CM

## 2015-09-28 DIAGNOSIS — F13239 Sedative, hypnotic or anxiolytic dependence with withdrawal, unspecified: Secondary | ICD-10-CM

## 2015-09-28 DIAGNOSIS — F13939 Sedative, hypnotic or anxiolytic use, unspecified with withdrawal, unspecified: Secondary | ICD-10-CM

## 2015-09-28 DIAGNOSIS — F19939 Other psychoactive substance use, unspecified with withdrawal, unspecified: Secondary | ICD-10-CM

## 2015-09-28 DIAGNOSIS — F1020 Alcohol dependence, uncomplicated: Secondary | ICD-10-CM | POA: Diagnosis not present

## 2015-09-28 DIAGNOSIS — F19239 Other psychoactive substance dependence with withdrawal, unspecified: Secondary | ICD-10-CM | POA: Insufficient documentation

## 2015-09-28 DIAGNOSIS — F17210 Nicotine dependence, cigarettes, uncomplicated: Secondary | ICD-10-CM

## 2015-09-28 DIAGNOSIS — F411 Generalized anxiety disorder: Secondary | ICD-10-CM

## 2015-09-28 MED ORDER — BUSPIRONE HCL 10 MG PO TABS: 10.0000 mg | ORAL_TABLET | Freq: Three times a day (TID) | ORAL | Status: DC

## 2015-09-28 NOTE — Progress Notes (Signed)
S Pt c/o irritability/anxiety with Buspar BID (15 mg) would like TID deosing without dose increase O Buspar comes in 10 mg  A TID dosing possible without dose increase/PAWS P-Buspar 10 mg tid order sent.FU PRN

## 2015-09-28 NOTE — Progress Notes (Signed)
Patient ID: Kendra Ball, female   DOB: 11-Jan-1946, 70 y.o.   MRN: [id number] SEE NOTE ATTACHED TO ORDERS ONLY CHART

## 2015-09-28 NOTE — Progress Notes (Signed)
    Daily Group Progress Note  Program: CD-IOP   Group Time: 1-2:30   Participation Level: Active  Behavioral Response: Appropriate and Sharing  Type of Therapy: Process Group  Topic: Counselors met with patients for group process session. Patients discussed their recovery from mind-altering drugs and alcohol. Patients discussed feelings and thoughts related to patient who had decided not to go forward with treatment. One patient discussed his absence from group yesterday. One patient took a drug test.     Group Time: 2:45-4  Participation Level: Active  Behavioral Response: Appropriate and Sharing  Type of Therapy: Psycho-education Group  Topic: Counselors met with patients for group psychoeducation session. Topic included "family roles" within the dysfunctional/alcoholic family. Patients were active and engaged in session.    Summary: Patient presented as active and engaged in session today. She reported that she attended 1 AA meeting. She reported that she is feeling much better since going off her Xanax some time ago. Her sleep is restful. Pt. discussed the importance of practicing the serenity prayer as opposed to just saying it. Pt. reported that she has been emotionally strained from helping her daughter who has mental health issues. Counselor reflected that she should focus on helping herself first. Pt. agreed. Pt. reported that she feels like a "light has come on" and her depression is lessening and that the group is helping her feel more connected to other people. Patient's sobriety date remains 5/4. Youlanda Roys, Counselor)   Family Program: Family present? NA   Name of family member(s):   UDS collected: No Results: negative  AA/NA attended?: YesThursday  Sponsor?: No   Hamsa Laurich, LCAS

## 2015-09-29 ENCOUNTER — Encounter (HOSPITAL_COMMUNITY): Payer: Self-pay | Admitting: Psychology

## 2015-09-29 NOTE — Progress Notes (Signed)
    Daily Group Progress Note  Program: CD-IOP   Group Time: 1-2:30  Participation Level: Active  Behavioral Response: Appropriate and Sharing  Type of Therapy: Process Group  Topic:Counselors met with patients for group process session. Patients discussed their recovery from mind-altering drugs and alcohol. 2 new patients started group today and 1 of those patients met with Investment banker, operational. Both new patients shared openly about their history of drug/alcohol dependence. Drug tests were collected from all members. Patients were active and engaged in session today.   Group Time: 2:45-4  Participation Level: Active  Behavioral Response: Appropriate and Sharing  Type of Therapy: Psycho-education Group  Topic: Counselors and guest IT sales professional met with patients for group psychoeducation session. Topic included a 1 hour "chair yoga" session in which patients were instructed to complete non-strenuous stretches, body postures, and breathing-awareness. Patients were active, engaged, and focused during session. Counselors and instructors processed the session afterward and discussed distractions, breathing, and individualizing patient's practice of yoga to aid in their recovery.   Summary: Sobriety date 5/4 Patient presented to group counseling today and was active and engaged in session. Patient had dyed her hair darker and commented that many friends did not recognize her at meetings. She reported that she felt irritable during the break and that she has been "easily set-off" to anger. She reported that she attended 1 AA meeting in which she picked up her 30-day chip. The group congratulated her. Patient then shared that she feels she may be angry since a friend of her family was taken off life support this past weekend. Counselor reflected that her anger may be masking pain underneath which she agreed. Youlanda Roys, Counselor)   Family Program: Family present? NA   Name of  family member(s):   UDS collected: Yes Results: negative  AA/NA attended?: [place]  Sponsor?: No   Wilbert Schouten, LCAS

## 2015-09-29 NOTE — Progress Notes (Unsigned)
Kendra Ball is a 70 y.o. female patient. CD-IOP: Treatment Planning Session. This counselor met with the patient this afternoon as scheduled. The patient is very early in recovery and has attended 5 group sessions. Despite being new to sobriety and treatment, she has responded enthusiastically. She is attending [place] meetings and sharing openly in group about her alcohol use. Today the patient shared that this program has been a Therapist, sports. She explained how she feels as if she is 'waking up' to her life for the first time. This counselor pointed out that she was taught from a little girl that her life was meant to be spent taking care of others and sacrificing yourself to insure that your loved ones are cared for. It was noted, "You drank the cool-aid". The patient laughed and agreed that she had accepted this without question. Now, for the first time, the patient is realizing she has a choice. In fact, she has lots of choices, including saying no. The patient is 70 yo and is just coming to this realization in her life. she was quick to point out she is not going to regret the past, but intends to make the most of the years she has ahead of her. The importance of identifying goals for treatment was introduced and the patient responded to this need easily. The patient reported that staying stopped and sober was her first treatment goal. She was quick to note that she had been trying to control her drinking and reduce her intake for a long time - without any success. The patient identified developing friends among the recovery community and attending [place] meetings was the way she intended to remain sober going forward. The patient talked about the process of making choices for herself and ones that meet her needs. We discussed the resistance family members may display when she begins making her life a priority. At the conclusion of the session, the treatment plans was reviewed, signed and completed accordingly. We  will continue to follow closely in the days ahead. The patient responded well to this intervention and her sobriety date remains 5/4.      Mannat Benedetti, LCAS

## 2015-09-29 NOTE — Progress Notes (Signed)
    Daily Group Progress Note  Program: CD-IOP   Group Time: 1-2:30 pm  Participation Level: Active  Behavioral Response: Sharing  Type of Therapy: Process Group  Topic: Process: the first half of group was spent in process. Members shared about the things that have transpired since this group met last week on Thursday. The holiday weekend had extended our session and there was a lot to discuss. There were no new sobriety dates, but members shared about their struggles, challenges and successes in sobriety. Random drug tests were collected from all members present today. The program director met with 2 group members during the session today.   Group Time: 2:45- 4pm  Participation Level: Active  Behavioral Response: Appropriate  Type of Therapy: Psycho-education Group  Topic: Psycho-Ed: the second half of group was spent in a psycho-ed. The focus of this session was on Family Roles in Addictive or Dysfunctional Family Systems. The counselor provided a handout on family roles and a discussion ensued. The session was disrupted when the member who had just met with the program director returned and announced she had to leave the group. She cried as she explained he was going to take her off her medication and she didn't want that. The counselor reminding members that certain meds are not allowed in this program including benzodiazepines. The patient cried as she explained she had been taking klonipin for 5 years and her psychiatrist had told her she shouldn't stop taking it. Group members shared their feelings and discomfort with this information. The patient got up and walked out of the group room and while this counselor followed her out, the co-therapist shared briefly with members before the end of the group session.   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 She provided valuable feedback to her fellow  group member about the danger of isolating. She stated, "I isolated when I was drinking". The patient reported she had an enlightening weekend attending 3 AA meetings, drinking the [place] magazine, "[employer] [place]' and just observing for the first time in a long time. She expressed fear about falling into her old patterns and reported her daily reading of Melodie Beattie's book, The Language of Letting Go' keeps her reminded of her new life and what she wants to change. The patient had begun to share about her siblings and family roles when her fellow group member disclosed the conversation with the Market researcher. The patient expressed concern about this group member and her sadness over having to leave. They have gone to meetings together. The patient was supportive and provided helpful feedback in this session. Her sobriety date remains 5/4.   Family Program: Family present? No   Name of family member(s):   UDS collected: Yes Results: negative  AA/NA attended?: YesMonday, Friday and Sunday  Sponsor?: No   Kendra Ball, LCAS

## 2015-09-30 ENCOUNTER — Other Ambulatory Visit (HOSPITAL_COMMUNITY): Payer: 59 | Admitting: Psychology

## 2015-09-30 DIAGNOSIS — F1020 Alcohol dependence, uncomplicated: Secondary | ICD-10-CM | POA: Diagnosis not present

## 2015-09-30 DIAGNOSIS — F331 Major depressive disorder, recurrent, moderate: Secondary | ICD-10-CM

## 2015-09-30 LAB — PRESCRIPTION ABUSE MONITORING 17P, URINE
6-Acetylmorphine, Urine: NEGATIVE ng/mL
Amphetamine Scrn, Ur: NEGATIVE ng/mL
BARBITURATE SCREEN URINE: NEGATIVE ng/mL
BENZODIAZEPINE SCREEN, URINE: NEGATIVE ng/mL
Buprenorphine, Urine: NEGATIVE ng/mL
CANNABINOIDS UR QL SCN: NEGATIVE ng/mL
Carisoprodol/Meprobamate, Ur: NEGATIVE ng/mL
Cocaine (Metab) Scrn, Ur: NEGATIVE ng/mL
Creatinine(Crt), U: 32.3 mg/dL (ref 20.0–300.0)
EDDP, Urine: NEGATIVE ng/mL
Fentanyl, Urine: NEGATIVE pg/mL
MDMA Screen, Urine: NEGATIVE ng/mL
Meperidine Screen, Urine: NEGATIVE ng/mL
Methadone Screen, Urine: NEGATIVE ng/mL
Nitrite Urine, Quantitative: NEGATIVE ug/mL
OXYCODONE+OXYMORPHONE UR QL SCN: NEGATIVE ng/mL
Opiate Scrn, Ur: NEGATIVE ng/mL
Ph of Urine: 5.5 (ref 4.5–8.9)
Phencyclidine Qn, Ur: NEGATIVE ng/mL
Propoxyphene Scrn, Ur: NEGATIVE ng/mL
SPECIFIC GRAVITY: 1.008
Tapentadol, Urine: NEGATIVE ng/mL
Tramadol Screen, Urine: NEGATIVE ng/mL

## 2015-09-30 LAB — ETHYL GLUCURONIDE, URINE: Ethyl Glucuronide Screen, Ur: NEGATIVE ng/mL

## 2015-10-01 ENCOUNTER — Other Ambulatory Visit (HOSPITAL_COMMUNITY): Payer: 59 | Admitting: Psychology

## 2015-10-01 DIAGNOSIS — F1020 Alcohol dependence, uncomplicated: Secondary | ICD-10-CM

## 2015-10-01 DIAGNOSIS — F331 Major depressive disorder, recurrent, moderate: Secondary | ICD-10-CM

## 2015-10-05 ENCOUNTER — Other Ambulatory Visit (HOSPITAL_COMMUNITY): Payer: 59

## 2015-10-06 ENCOUNTER — Encounter (HOSPITAL_COMMUNITY): Payer: Self-pay | Admitting: Psychology

## 2015-10-06 NOTE — Progress Notes (Signed)
Daily Group Progress Note  Program: CD-IOP   Group Time: 1-2:30 pm  Participation Level: Active  Behavioral Response: Appropriate and Sharing  Type of Therapy: Process Group  Topic: Process: the first half of group was spent in process. Members shared about the things they had done to support their recovery since we last met. They were also invited to share about any cravings or challenges to their sobriety. A new group member was present and he was asked to introduce himself to the group and explain what he hopes to gain here in this program. A drug test was collected from the new group member and test results from those collected on Monday were returned. The new group member met with the program director during group today.   Group Time: 2:45- 4pm  Participation Level: Active  Behavioral Response: Appropriate  Type of Therapy: Psycho-education Group  Topic: Psycho-Ed: the second half of group was spent in a psycho-ed utilizing 'Family Sculpture". After a thorough explanation of this exercise, a member volunteered to 'sculpt' his family. It proved to be an n emotional and powerful experience for the 3 group members 'posing' as family members and the group member cried as he recounted this metaphoric image. Another sculpture followed this one and was equally disturbing. Prior to group ending today, members were invited to share about any discomfort or negative thoughts they were experiencing. The session proved powerful for all present today.    Summary: The patient reported her dear friend, Marlowe Kays, is dying and they have brought in hospice. She has shared in previous group sessions that this is one of her oldest and best friends. She lives in [place], where the rest of the patient's siblings live. It is very sad and a tremendous loss for her and her sisters. Her friend is only 32 yo. the patient shared that she had gone to sit out on the patio with her dog and read from the [place], but instead, she explained that she had just sat and looked at the yard, listened to the birds and was just really 'present' in the moment. She sat there with the dog in her lap for almost 2 hours. The counselor applauded this news and wondered when she had last done that. The patient replied, 'never'. She reported, 'damn pink cloud'. She laughed as she described how she has gone from feeling wonderful and excited, to grouchy and irritable about every little thing. The patient described herself as 'feeling so low'. Other members shared about their experiences when the 'pink cloud' left them. She attended 1 AA meeting and saw another group member there. She had laughed because she had colored her hair since we last met and he almost didn't recognize her. The patient was attentive and played a family member in the family sculpture. She offered good insight about her own observations of the sculptures and helpful feedback to the group member who had sculpted the piece. This patient continues to make good progress and displays a growing insight into the choices she has made through the years and the changes she wants to make going forward in her life. She responded well to this intervention and her sobriety date remains 5/4.   Family Program: Family present? No   Name of family member(s):   UDS collected: No Results:   AA/NA attended?: [place]  Sponsor?: No   Blong Busk, LCAS

## 2015-10-07 ENCOUNTER — Other Ambulatory Visit (HOSPITAL_COMMUNITY): Payer: 59

## 2015-10-08 ENCOUNTER — Other Ambulatory Visit (HOSPITAL_COMMUNITY): Payer: 59

## 2015-10-08 ENCOUNTER — Ambulatory Visit (HOSPITAL_COMMUNITY): Payer: Self-pay | Admitting: Psychiatry

## 2015-10-09 ENCOUNTER — Encounter (HOSPITAL_COMMUNITY): Payer: Self-pay | Admitting: Psychology

## 2015-10-09 NOTE — Progress Notes (Signed)
    Daily Group Progress Note  Program: CD-IOP   Group Time: 1-2 pm  Participation Level: Active  Behavioral Response: Appropriate and Sharing  Type of Therapy: Process Group  Topic: Process: the first 60 minutes of group today was spent in process. Members were asked to briefly recount what they had done to support their sobriety since yesterday afternoon's group session. The new group member arrived about 30 minutes late, but apologized when he arrived. He had phoned and left message about late arrival time.   Group Time: 2:15-4 pm  Participation Level: Active  Behavioral Response: Appropriate  Type of Therapy: Psycho-education Group  Topic: Psycho-Ed: Family Sculpture/Graduation. The remainder of the session was spent in a psycho-ed on 'Family Sculpture'. This was a continuation of yesterday's exercise. A member volunteered and used his fellow group members to represent his own family members. Two other members provided their own family sculptures and both were powerful and generated good discussion. As the session neared an end, a graduation ceremony was held honoring a member who was completing the program successfully today. Kind words were shared and the patient expressed gratitude and appreciation to the group for the support he had received while here in the program.   Summary: The patient was engaged and active in group today. The patient reported she had been drained yesterday after the intense group session with family sculpture. She shared that she had done something she hadn't done in a long while. A close friend of many years had phoned and invited her to come over to her house and visit. Instead of making up an excuse, as the patient explained she has done for years now, she agreed and went over the visit the friend. She admitted that even her friend was surprised she had taken her up on the offer. They had had a wonderful time reconnecting and she had shared about what was  going on in her life. The friend had been very supportive and forgiving. The patient reported she had not been a good friend for a long time and felt badly for the time they had lost. But she had been so caught up in her drinking and drugging that she just hadn't been available in any capacity. The patient expressed gratitude for being here in the program and for all that she is learning. She shared that her dear friend is nearing death up in [place]. She and her husband will travel up that way when the time comes. The friend is only 25 and has known her family and siblings for many years. In the psycho-ed, the patient stood in as a family member during the sculpture and provided valuable feedback about what it felt to be in this position. In the graduation ceremony, the patient noted she had not known the graduating member very long, but enjoyed his humor and wished him well.The patient responded well to this intervention and displays great enthusiasm for the ideas and concepts she is learning about recovery. She is doing everything we have asked of her and remains abstinent with a sobriety date of 5/4.  Family Program: Family present? No   Name of family member(s):   UDS collected: No Results:   AA/NA attended?: No  Sponsor?: No   Florance Paolillo, LCAS

## 2015-10-12 ENCOUNTER — Other Ambulatory Visit (HOSPITAL_COMMUNITY): Payer: 59 | Admitting: Psychology

## 2015-10-12 ENCOUNTER — Encounter (HOSPITAL_COMMUNITY): Payer: Self-pay | Admitting: Medical

## 2015-10-12 ENCOUNTER — Other Ambulatory Visit: Payer: Self-pay | Admitting: Family Medicine

## 2015-10-12 DIAGNOSIS — Z9884 Bariatric surgery status: Secondary | ICD-10-CM

## 2015-10-12 DIAGNOSIS — Z811 Family history of alcohol abuse and dependence: Secondary | ICD-10-CM

## 2015-10-12 DIAGNOSIS — G4700 Insomnia, unspecified: Secondary | ICD-10-CM

## 2015-10-12 DIAGNOSIS — F411 Generalized anxiety disorder: Secondary | ICD-10-CM

## 2015-10-12 DIAGNOSIS — F331 Major depressive disorder, recurrent, moderate: Secondary | ICD-10-CM

## 2015-10-12 DIAGNOSIS — F4321 Adjustment disorder with depressed mood: Secondary | ICD-10-CM | POA: Insufficient documentation

## 2015-10-12 DIAGNOSIS — F1020 Alcohol dependence, uncomplicated: Secondary | ICD-10-CM | POA: Diagnosis not present

## 2015-10-12 NOTE — Progress Notes (Signed)
Patient ID: Kendra Ball, female   DOB: [birth year], 70 y.o.   MRN: [id number]  [facility] [phone] Progress Note   Chief Complaint: History of Present Illness:    Suicidal Ideation: No Plan Formed: No Patient has means to carry out plan: No  Homicidal Ideation: No Plan Formed: No Patient has means to carry out plan: No  Review of Systems: Psychiatric: Agitation: No Hallucination: No Depressed Mood: Yes  4-5/10 Insomnia: No Hypersomnia: No Altered Concentration: No Feels Worthless: No Grandiose Ideas: No Belief In Special Powers: No New/Increased Substance Abuse: No Compulsions: No  Neurologic: Headache: No Seizure: No Paresthesias: No  Past Medical Family, Social History:  Outpatient Encounter Prescriptions as of  Medication Sig  .  .  .    .    .    [name]      Past Psychiatric History/Hospitalization(s): Anxiety:  Bipolar Disorder:  Depression: Mania: Psychosis: Schizophrenia:  Personality Disorder:  Hospitalization for psychiatric illness: History of Electroconvulsive Shock Therapy:  Prior Suicide Attempts:   Physical Exam: Constitutional:    General Appearance:   Musculoskeletal: Strength & Muscle Tone:  Gait & Station:  Patient leans:   Psychiatric: Speech (describe rate, volume, coherence, spontaneity, and abnormalities if any): Normal rate and rhythm  Thought Process (describe rate, content, abstract reasoning, and computation): normal  Associations: Coherent and Relevant  Thoughts: normal  Mental Status: Orientation: oriented to person, place, time/date and situation Mood & Affect: normal affect Attention Span & Concentration: Normal  Medical Decision Making (Choose Three):   Assessment:  Plan:

## 2015-10-13 ENCOUNTER — Ambulatory Visit
Admission: RE | Admit: 2015-10-13 | Discharge: 2015-10-13 | Disposition: A | Discharge status: Home / Self Care | Payer: Medicare Other | Source: Ambulatory Visit | Attending: Acute Care | Admitting: Acute Care

## 2015-10-13 ENCOUNTER — Ambulatory Visit (INDEPENDENT_AMBULATORY_CARE_PROVIDER_SITE_OTHER): Payer: Medicare Other | Admitting: Family Medicine

## 2015-10-13 ENCOUNTER — Encounter: Payer: Self-pay | Admitting: Family Medicine

## 2015-10-13 VITALS — BP 126/86 | HR 73 | Temp 98.2°F | Resp 20 | Wt 194.2 lb

## 2015-10-13 DIAGNOSIS — Z860100 Personal history of colon polyps, unspecified: Secondary | ICD-10-CM | POA: Insufficient documentation

## 2015-10-13 DIAGNOSIS — E039 Hypothyroidism, unspecified: Secondary | ICD-10-CM | POA: Diagnosis not present

## 2015-10-13 DIAGNOSIS — R634 Abnormal weight loss: Secondary | ICD-10-CM

## 2015-10-13 DIAGNOSIS — K529 Noninfective gastroenteritis and colitis, unspecified: Secondary | ICD-10-CM

## 2015-10-13 DIAGNOSIS — Z9884 Bariatric surgery status: Secondary | ICD-10-CM

## 2015-10-13 DIAGNOSIS — Z9049 Acquired absence of other specified parts of digestive tract: Secondary | ICD-10-CM | POA: Insufficient documentation

## 2015-10-13 DIAGNOSIS — I2510 Atherosclerotic heart disease of native coronary artery without angina pectoris: Secondary | ICD-10-CM

## 2015-10-13 DIAGNOSIS — Z9889 Other specified postprocedural states: Secondary | ICD-10-CM

## 2015-10-13 DIAGNOSIS — F17210 Nicotine dependence, cigarettes, uncomplicated: Secondary | ICD-10-CM

## 2015-10-13 HISTORY — DX: Noninfective gastroenteritis and colitis, unspecified: K52.9

## 2015-10-13 HISTORY — DX: Personal history of colonic polyps: Z86.010

## 2015-10-13 HISTORY — DX: Personal history of colon polyps, unspecified: Z86.0100

## 2015-10-13 LAB — CBC WITH DIFFERENTIAL/PLATELET
Basophils Absolute: 76 cells/uL (ref 0–200)
Basophils Relative: 1 %
Eosinophils Absolute: 152 cells/uL (ref 15–500)
Eosinophils Relative: 2 %
HCT: 42.7 % (ref 35.0–45.0)
Hemoglobin: 14.2 g/dL (ref 11.7–15.5)
Lymphocytes Relative: 27 %
Lymphs Abs: 2052 cells/uL (ref 850–3900)
MCH: 32.6 pg (ref 27.0–33.0)
MCHC: 33.3 g/dL (ref 32.0–36.0)
MCV: 97.9 fL (ref 80.0–100.0)
MPV: 9.7 fL (ref 7.5–12.5)
Monocytes Absolute: 532 cells/uL (ref 200–950)
Monocytes Relative: 7 %
Neutro Abs: 4788 cells/uL (ref 1500–7800)
Neutrophils Relative %: 63 %
Platelets: 331 10*3/uL (ref 140–400)
RBC: 4.36 MIL/uL (ref 3.80–5.10)
RDW: 13 % (ref 11.0–15.0)
WBC: 7.6 10*3/uL (ref 3.8–10.8)

## 2015-10-13 LAB — COMPREHENSIVE METABOLIC PANEL
ALT: 18 U/L (ref 6–29)
AST: 19 U/L (ref 10–35)
Albumin: 4.2 g/dL (ref 3.6–5.1)
Alkaline Phosphatase: 44 U/L (ref 33–130)
BUN: 14 mg/dL (ref 7–25)
CO2: 24 mmol/L (ref 20–31)
Calcium: 9.3 mg/dL (ref 8.6–10.4)
Chloride: 103 mmol/L (ref 98–110)
Creat: 0.74 mg/dL (ref 0.50–0.99)
Glucose, Bld: 83 mg/dL (ref 65–99)
Potassium: 4.5 mmol/L (ref 3.5–5.3)
Sodium: 137 mmol/L (ref 135–146)
Total Bilirubin: 0.4 mg/dL (ref 0.2–1.2)
Total Protein: 6.5 g/dL (ref 6.1–8.1)

## 2015-10-13 LAB — LIPASE: Lipase: 61 U/L — ABNORMAL HIGH (ref 7–60)

## 2015-10-13 LAB — C-REACTIVE PROTEIN: CRP: <0.5 mg/dL (ref <0.60)

## 2015-10-13 LAB — VITAMIN B12: Vitamin B-12: 1222 pg/mL — ABNORMAL HIGH (ref 200–1100)

## 2015-10-13 LAB — MAGNESIUM: Magnesium: 2.1 mg/dL (ref 1.5–2.5)

## 2015-10-13 MED ORDER — LEVOTHYROXINE SODIUM 100 MCG PO TABS: 100.0000 ug | ORAL_TABLET | Freq: Every day | ORAL | Status: DC

## 2015-10-13 MED ORDER — ALIGN 4 MG PO CAPS: 1.0000 | ORAL_CAPSULE | Freq: Every day | ORAL | Status: DC

## 2015-10-13 NOTE — Progress Notes (Signed)
Patient ID: Kendra Ball, female   DOB: 1946-03-16, 70 y.o.   MRN: [id number]    Kendra Ball , [birth year], 69 y.o., female MRN: [id number]  CC: fecal incontinence  Subjective: [name]  Pt presents for diarrhea that started in January 2017, about 6 months ago. She reports over the last month the diarrhea is worse. She started to increase her iron and eating cheese , which has allowed her to have formed stool on occasion the last 3 days. She endorses fecal urgency in the day, associated with strong abdominal cramps and low back pain. The pain resolves with bowel movement. She endorses not being able to make it to the bathroom sometimes. She endorses having large watery stools/fecal incontinence during sleep, weight loss of 10 lbs and loss of appetite. She has well water, however family members not affected. She was camping just prior to January on the [facility]. She had starting drinking protein shakes about 3-4 months ago to help with calorie intake. She states she eats about one very small solid meal a day. She has a history partial colectomy (intusseption from lipoma-04/2014), gastric bypass, colon polyps (2012). She has a FHX of colon cancer. She has been on high dose psychiatric medications and abusing alcohol. However she has stopped drinking since 08/27/2015, attending [place] meetings, and is weaning off psychiatric medications (by [facility]). Pt was tapered off benzodiazepines during her "detox". All of her GI symptoms started prior to weaning off medications/alcohol, but have worsened since being out of the hospital. CT scan completed today for pulmonary nodule with normal upper abd (liver, spleen, pancreas, adrenals and left kidney).    Allergies  Allergen Reactions  . Morphine Sulfate     Swelling around injection area   Social History  Substance Use Topics  . Smoking status: Current Every Day Smoker -- 1.00 packs/day for 50 years    Types: Cigarettes  . Smokeless tobacco: Never Used  . Alcohol  Use: 15.6 oz/week    10 Glasses of wine, 16 Shots of liquor per week     Comment: 3-4 nights a week she will have 3-4 bourbens or 1 glass of wine   Past Medical History  Diagnosis Date  . DEPRESSION 08/26/2008  . HYPERTENSION 08/26/2008  . HYPOTHYROIDISM 08/26/2008  . OSTEOPENIA 08/26/2008  . TOBACCO USER 12/25/2009  . Obesity   . Hx of colonic polyps     First noted on  colonoscopy 2012  . Hypercholesteremia    Past Surgical History  Procedure Laterality Date  . Gastric bypass    . Cesarean section    . Dilation and curettage of uterus      x2  . Forearm fracture surgery      right  . Tonsillectomy    . Colon resection N/A 05/09/2014    Procedure: LAPAROSCOPIC HAND-ASSISTED EXTENDED RIGHT COLECTOMY, LAPAROSCOPIC LYSIS OF ADHESIONS, SPLENIC FLEXURE MOBILIZATION.;  Surgeon: [name], MD;  Location: [facility];  Service: General;  Laterality: N/A;   Family History  Problem Relation Age of Onset  . Cancer Mother     colon  . Dementia Mother   . Depression Mother   . Heart disease Father 17  . Heart disease Sister 67    stents, pacemaker  . Depression Sister   . Heart disease Brother 26    CABG, MI  . Depression Brother   . Obesity Daughter   . Suicidality Daughter   . Bipolar disorder Daughter   . Obesity Son   .  Mental illness Daughter     bipolar  . Obesity Daughter   . Dementia Paternal Aunt   . Depression Sister   . Alcohol abuse Maternal Uncle      Medication List       This list is accurate as of: 10/13/15  3:16 PM.  Always use your most recent med list.               alendronate 70 MG tablet  Commonly known as:  FOSAMAX  Take 70 mg by mouth once a week. Reported on 10/06/2015     aspirin 81 MG tablet  Take 81 mg by mouth daily.     budesonide-formoterol 160-4.5 MCG/ACT inhaler  Commonly known as:  SYMBICORT  Inhale 2 puffs into the lungs 2 (two) times daily.     buPROPion 100 MG tablet  Commonly known as:  WELLBUTRIN  Take 100 mg by mouth daily.      busPIRone 10 MG tablet  Commonly known as:  BUSPAR  Take 1 tablet (10 mg total) by mouth 3 (three) times daily.     ferrous fumarate 325 (106 Fe) MG Tabs tablet  Commonly known as:  HEMOCYTE - 106 mg FE  Take 1 tablet by mouth daily. Reported on 10/06/2015     levothyroxine 100 MCG tablet  Commonly known as:  SYNTHROID, LEVOTHROID  Take 1 tablet (100 mcg total) by mouth daily before breakfast.     lisinopril 10 MG tablet  Commonly known as:  PRINIVIL,ZESTRIL  Take 1 tablet (10 mg total) by mouth daily.     Magnesium 250 MG Tabs  Take 250 mg by mouth 2 (two) times daily.     simvastatin 10 MG tablet  Commonly known as:  ZOCOR  Take 1 tablet (10 mg total) by mouth at bedtime.     traZODone 150 MG tablet  Commonly known as:  DESYREL  Take 1 tablet (150 mg total) by mouth at bedtime.     venlafaxine XR 75 MG 24 hr capsule  Commonly known as:  EFFEXOR-XR  Take 3 capsules (225 mg total) by mouth daily with breakfast.     VITAMIN B-12 PO  Take 1 tablet by mouth daily.       ROS: Negative, with the exception of above mentioned in HPI Objective:  BP 126/86 mmHg  Pulse 73  Temp(Src) 98.2 F (36.8 C)  Resp 20  Wt 194 lb 4 oz (88.111 kg)  SpO2 94% Body mass index is 34.42 kg/(m^2). Gen: Afebrile. No acute distress. Nontoxic in appearance, well developed, well nourished female. Pleasant.  HENT: AT. [place]. MMM, no oral lesions.  Eyes:Pupils Equal Round Reactive to light, Extraocular movements intact,  Conjunctiva without redness, discharge or icterus. Neck/lymp/endocrine: Supple, no lymphadenopathy, no thyromegaly CV: RRR  Abd: Soft. obese. NTND. BS present. no Masses palpated. No rebound or guarding.  Skin: no  rashes, purpura or petechiae.  Neuro: Normal gait. PERLA. EOMi. Alert. Oriented x3   Assessment/Plan: Kendra Ball is a 70 y.o. female present for acute OV for  Hypothyroidism, unspecified hypothyroidism type - stop all other doses. She has been taking old  medication. (after onset of diarrhea) - refills on 100 mcg synthroid, pt to have TSH in 10 weeks.  - levothyroxine (SYNTHROID, LEVOTHROID) 100 MCG tablet; Take 1 tablet (100   Chronic diarrhea/loss of weight/partial colectomy/gastric bypass: - DDX: dumping syndrome from prior surgeries, infectious, IBD/IBS. - start probiotic. Await lab results. Needs new GI for colonoscopy which is  due for 5 year surveillance on polyps.  - will refer to GI for colonoscopy and may need to send urgently for fecal incontinence if etiology not identified.  - CBC w/Diff - Comprehensive metabolic panel - VITAMIN D 25 Hydroxy (Vit-D Deficiency, Fractures) - Magnesium - Sed Rate (ESR) - C-reactive protein - B12 - Lipase - Probiotic Product (ALIGN) 4 MG CAPS; Take 1 capsule (4 mg total) by mouth daily.  Dispense: 30 capsule; Refill: 1 - Ova and parasite examination; Future - Gastrointestinal Pathogen Panel PCR; Future  Greater than 40 minutes was spent with patient, greater than 50% of that time was spent face-to-face with patient counseling and coordinating care.  electronically signed by:  Howard Pouch, DO  [place]

## 2015-10-13 NOTE — Patient Instructions (Addendum)
I want you to start Align probiotic, I have called this in but sometimes the pharmacist will tell you it is over the counter.  I will refer you to new gastroenterologist. Make sure to bring stool studies in on Friday.  Chronic Diarrhea Diarrhea is frequent loose and watery bowel movements. It can cause you to feel weak and dehydrated. Dehydration can cause you to become tired and thirsty and to have a dry mouth, decreased urination, and dark yellow urine. Diarrhea is a sign of another problem, most often an infection that will not last long. In most cases, diarrhea lasts 2-3 days. Diarrhea that lasts longer than 4 weeks is called long-lasting (chronic) diarrhea. It is important to treat your diarrhea as directed by your health care provider to lessen or prevent future episodes of diarrhea.  CAUSES  There are many causes of chronic diarrhea. The following are some possible causes:   Gastrointestinal infections caused by viruses, bacteria, or parasites.   Food poisoning or food allergies.   Certain medicines, such as antibiotics, chemotherapy, and laxatives.   Artificial sweeteners and fructose.   Digestive disorders, such as celiac disease and inflammatory bowel diseases.   Irritable bowel syndrome.  Some disorders of the pancreas.  Disorders of the thyroid.  Reduced blood flow to the intestines.  Cancer. Sometimes the cause of chronic diarrhea is unknown. RISK FACTORS  Having a severely weakened immune system, such as from HIV or AIDS.   Taking certain types of cancer-fighting drugs (such as with chemotherapy) or other medicines.   Having had a recent organ transplant.   Having a portion of the stomach or small bowel removed.   Traveling to countries where food and water supplies are often contaminated.  SYMPTOMS  In addition to frequent, loose stools, diarrhea may cause:   Cramping.   Abdominal pain.   Nausea.   Fever.  Fatigue.  Urgent need to use  the bathroom.  Loss of bowel control. DIAGNOSIS  Your health care provider must take a careful history and perform a physical exam. Tests given are based on your symptoms and history. Tests may include:   Blood or stool tests. Three or more stool samples may be examined. Stool cultures may be used to test for bacteria or parasites.   X-rays.   A procedure in which a thin tube is inserted into the mouth or rectum (endoscopy). This allows the health care provider to look inside the intestine.  TREATMENT   Treatment is aimed at correcting the cause of the diarrhea when possible.  Diarrhea caused by an infection can often be treated with antibiotic medicines.  Diarrhea not caused by an infection may require you to take long-term medicine or have surgery. Specific treatment should be discussed with your health care provider.  If the cause cannot be determined, treatment aims to relieve symptoms and prevent dehydration. Serious health problems can occur if you do not maintain proper fluid levels. Treatment may include:  Taking an oral rehydration solution (ORS).  Not drinking beverages that contain caffeine (such as tea, coffee, and soft drinks).  Not drinking alcohol.  Maintaining well-balanced nutrition to help you recover faster. HOME CARE INSTRUCTIONS   Drink enough fluids to keep urine clear or pale yellow. Drink 1 cup (8 oz) of fluid for each diarrhea episode. Avoid fluids that contain simple sugars, fruit juices, whole milk products, and sodas. Hydrate with an ORS. You may purchase the ORS or prepare it at home by mixing the following ingredients together:   -  tsp (1.7-3  mL) table salt.   tsp (3  mL) baking soda.   tsp (1.7 mL) salt substitute containing potassium chloride.  1 tbsp (20 mL) sugar.  4.2 c (1 L) of water.   Certain foods and beverages may increase the speed at which food moves through the gastrointestinal (GI) tract. These foods and beverages should be  avoided. They include:  Caffeinated and alcoholic beverages.  High-fiber foods, such as raw fruits and vegetables, nuts, seeds, and whole grain breads and cereals.  Foods and beverages sweetened with sugar alcohols, such as xylitol, sorbitol, and mannitol.   Some foods may be well tolerated and may help thicken stool. These include:  Starchy foods, such as rice, toast, pasta, low-sugar cereal, oatmeal, grits, baked potatoes, crackers, and bagels.  Bananas.  Applesauce.  Add probiotic-rich foods to help increase healthy bacteria in the GI tract. These include yogurt and fermented milk products.  Wash your hands well after each diarrhea episode.  Only take over-the-counter or prescription medicines as directed by your health care provider.  Take a warm bath to relieve any burning or pain from frequent diarrhea episodes. SEEK MEDICAL CARE IF:   You are not urinating as often.  Your urine is a dark color.  You become very tired or dizzy.  You have severe pain in the abdomen or rectum.  Your have blood or pus in your stools.  Your stools look black and tarry. SEEK IMMEDIATE MEDICAL CARE IF:   You are unable to keep fluids down.  You have persistent vomiting.  You have blood in your stool.  Your stools are black and tarry.  You do not urinate in 6-8 hours, or there is only a small amount of very dark urine.  You have abdominal pain that increases or localizes.  You have weakness, dizziness, confusion, or lightheadedness.  You have a severe headache.  Your diarrhea gets worse or does not get better.  You have a fever or persistent symptoms for more than 2-3 days.  You have a fever and your symptoms suddenly get worse. MAKE SURE YOU:   Understand these instructions.  Will watch your condition.  Will get help right away if you are not doing well or get worse.   This information is not intended to replace advice given to you by your health care provider. Make  sure you discuss any questions you have with your health care provider.   Document Released: 07/02/2003 Document Revised: 04/16/2013 Document Reviewed: 10/04/2012 Elsevier Interactive Patient Education [employer].

## 2015-10-14 ENCOUNTER — Other Ambulatory Visit (HOSPITAL_COMMUNITY): Payer: 59 | Admitting: Psychology

## 2015-10-14 ENCOUNTER — Telehealth: Payer: Self-pay | Admitting: Family Medicine

## 2015-10-14 ENCOUNTER — Encounter (HOSPITAL_COMMUNITY): Payer: Self-pay | Admitting: Psychology

## 2015-10-14 DIAGNOSIS — F1020 Alcohol dependence, uncomplicated: Secondary | ICD-10-CM

## 2015-10-14 DIAGNOSIS — F331 Major depressive disorder, recurrent, moderate: Secondary | ICD-10-CM

## 2015-10-14 LAB — ETHYL GLUCURONIDE, URINE: Ethyl Glucuronide Screen, Ur: NEGATIVE ng/mL

## 2015-10-14 LAB — PRESCRIPTION ABUSE MONITORING 17P, URINE
6-Acetylmorphine, Urine: NEGATIVE ng/mL
Amphetamine Scrn, Ur: NEGATIVE ng/mL
BARBITURATE SCREEN URINE: NEGATIVE ng/mL
BENZODIAZEPINE SCREEN, URINE: NEGATIVE ng/mL
Buprenorphine, Urine: NEGATIVE ng/mL
CANNABINOIDS UR QL SCN: NEGATIVE ng/mL
Carisoprodol/Meprobamate, Ur: NEGATIVE ng/mL
Cocaine (Metab) Scrn, Ur: NEGATIVE ng/mL
Creatinine(Crt), U: 27.3 mg/dL (ref 20.0–300.0)
EDDP, Urine: NEGATIVE ng/mL
Fentanyl, Urine: NEGATIVE pg/mL
MDMA Screen, Urine: NEGATIVE ng/mL
Meperidine Screen, Urine: NEGATIVE ng/mL
Methadone Screen, Urine: NEGATIVE ng/mL
Nitrite Urine, Quantitative: NEGATIVE ug/mL
OXYCODONE+OXYMORPHONE UR QL SCN: NEGATIVE ng/mL
Opiate Scrn, Ur: NEGATIVE ng/mL
Ph of Urine: 5.6 (ref 4.5–8.9)
Phencyclidine Qn, Ur: NEGATIVE ng/mL
Propoxyphene Scrn, Ur: NEGATIVE ng/mL
SPECIFIC GRAVITY: 1.007
Tapentadol, Urine: NEGATIVE ng/mL
Tramadol Screen, Urine: NEGATIVE ng/mL

## 2015-10-14 LAB — VITAMIN D 25 HYDROXY (VIT D DEFICIENCY, FRACTURES): Vit D, 25-Hydroxy: 49 ng/mL (ref 30–100)

## 2015-10-14 LAB — SEDIMENTATION RATE: Sed Rate: 9 mm/hr (ref 0–30)

## 2015-10-14 NOTE — Telephone Encounter (Signed)
Left message on patient voice mail with results and information.

## 2015-10-14 NOTE — Telephone Encounter (Signed)
Please call pt: - her labs are normal. We will go over in more detail at her appt Friday

## 2015-10-14 NOTE — Progress Notes (Signed)
    Daily Group Progress Note  Program: CD-IOP   Group Time: 1-2:30 pm  Participation Level: Active  Behavioral Response: Appropriate and Sharing  Type of Therapy: Process Group  Topic: Process:  The first part of group was spent in process. Members shared about what they had done over the weekend to support their recovery. They also identified any challenges or temptations that may have presented themselves since we last met. One member checked-in with a new sobriety date of today. He proceeded to share the events of the weekend, including a return to use on Friday and then again on Sunday. He was filled with remorse as he described these events.  Much of the remainder of this session was spent processing the relapses. The medical director met with 2 group members today and random drug tests were collected from 6 group members.   Group Time: 2;45- 4PM  Participation Level: Active  Behavioral Response: Appropriate  Type of Therapy: Psycho-education Group  Topic: Psycho-Ed: Relapse Prevention. The second half of group was spent in a psycho-ed. it included a utube video about Baclofen, which reduces cocaine cravings. After discussing the video, group members were given a handout entitled, "Be Smart, Not Strong" from the [employer] Treatment Manual. It followed on the importance of avoiding triggers and potentially dangerous relapse situations. The focus was not on using willpower, which doesn't work, but setting one's self up for Sobriety.   Summary: The patient appeared today in group for the first time in over a week. She had been excused to [place] an [place] a dear friend. She provided honest feedback to the member who had relapsed over the weekend, noting that she had seen him at the 2 pm Sunday AA meeting and "he wasn't himself". She knew something was wrong, but he had left quickly after the meeting. The patient pointed out, 'we are all just one drink away from that'. She  reported that her sisters had been very gentle with her last week and that she was showing her sisters, "a new side of myself". They are very supportive of her recovery and drove her to 2 different AA meetings. The patient reported that she was very happy to be back and that "I need the support of this group" and what a terrible 'struggle it would be without this support". The patient met briefly with the program director to review her medications. She provided good input during the discussion on relapse prevention and responded well to this intervention. A drug test was collected from this patient. Her sobriety date is 5/4.   Family Program: Family present? No   Name of family member(s):   UDS collected: Yes Results: negative  AA/NA attended?: YesMonday, Tuesday and Wednesday  Sponsor?: No   Rosa Gambale, LCAS

## 2015-10-15 ENCOUNTER — Encounter (HOSPITAL_COMMUNITY): Payer: Self-pay | Admitting: Psychology

## 2015-10-15 ENCOUNTER — Other Ambulatory Visit (HOSPITAL_COMMUNITY): Payer: 59 | Admitting: Psychology

## 2015-10-15 DIAGNOSIS — F1020 Alcohol dependence, uncomplicated: Secondary | ICD-10-CM | POA: Diagnosis not present

## 2015-10-15 DIAGNOSIS — F331 Major depressive disorder, recurrent, moderate: Secondary | ICD-10-CM

## 2015-10-15 NOTE — Progress Notes (Signed)
Kendra Ball is a 70 y.o. female patient. CD-IOP: Individual Counseling Session. Counselor met with the patient today for her weekly individual session. She has returned from a week up in [place] attending the funeral of a dear and old friend. The patient reported she had found the group session yesterday on relapse prevention very helpful. The patient reported she realized how trivial some of her fears have been. These include lots of unnecessary worries about things out of her control. For example, she had phoned her new sponsor last night and left a voice mail message. The sponsor returned the call later in the evening and they had a nice chat. Despite being a little anxious about phoning her, the patient admitted they had had a great talk and she had felt very comfortable speaking with this woman. Counselor pointed out as she continues to reach out to other women in recovery through phone calls she will gradually become more comfortable in this act and her ability to reach out and ask for help will increase and expand. The patient expressed delight in how much better she feels and how much more alive she is. She also lamented that loss of all those years depressed and in bed with no hope. We discussed these new boundaries she is setting up and what she might anticipate from those who will find those new boundaries in convenient or contradictory to what they might want. The patient reported she had a physical tomorrow with her PCP. She had reported some digestion issues that she was having and they had taken some blood work and would do more to determine what is going on. The patient is making excellent progress in her very early recovery and displays a growing insight and appreciation for this new life of recovery. Her progress towards treatment goals remains noteworthy. She has remained alcohol and drug-free since before entering this program. The patient is attending daily AA meetings and recently  secured a sponsor. We will continue to follow closely in the days ahead. She responded well to this intervention and her sobriety date remains 5/4.       Kymoni Monday, LCAS

## 2015-10-15 NOTE — Progress Notes (Signed)
    Daily Group Progress Note  Program: CD-IOP   Group Time: 1-2:30  Participation Level: Active  Behavioral Response: Appropriate and Sharing  Type of Therapy: Process Group  Topic: Counselors met with patients for group process session. Patients discussed their recovery from mind-altering drugs and alcohol. Patients were active and engaged. One patient was excused absent for a prescheduled vacation. Drug test was collected from one patient who missed group on Monday. Patients briefly checked in with their sobriety dates and any challenges they had faced since last meeting in CD-IOP. 2 Patients met with Investment banker, operational.     Group Time: 2:45-4  Participation Level: Active  Behavioral Response: Appropriate and Sharing  Type of Therapy: Psycho-education Group  Topic: Counselors met with patients for group psychoeducation session. The topic included relapse prevention techniques and developing a relapse prevention plan. Counselor showed a video describing the 3 stages of relapse: emotional, mental, and physical. Patients were encouraged to think critically about their experiences with relapsing and how they could avoid their triggers before they are physically tempted to use.   Summary: Sobriety date remains 5/4 Patient presented to group counseling from 1-4pm. She was active and engaged in session. She reported that she attended 1 AA meeting. She spent yesterday going to doctors appointments which took up most of her day. She has identified her problematic behavior when she begins to wonder "what if." about being judged by others or hurting other people's feelings. Counselor used Socratic questioning and CBT to dispute her negative thinking and core beliefs. Patient reported that she got a sponsor and felt good since she was able to quiet her mind which was telling her to not get a sponsor because it could be uncomfortable. Patient shard that she enjoyed learning about relapse  prevention and self care even though she associates self care with "selfishness". Counselor reframed self care as a way to stay sober and healthy for herself and the people she loves.  Youlanda Roys, Counselor  Family Program: Family present? NA   Name of family member(s):   UDS collected: No Results: negative  AA/NA attended?: YesTuesday  Sponsor?: Yes   Miguelangel Korn, LCAS

## 2015-10-16 ENCOUNTER — Encounter: Payer: Self-pay | Admitting: Family Medicine

## 2015-10-16 ENCOUNTER — Encounter: Payer: Self-pay | Admitting: Gastroenterology

## 2015-10-16 ENCOUNTER — Ambulatory Visit (INDEPENDENT_AMBULATORY_CARE_PROVIDER_SITE_OTHER): Payer: Medicare Other | Admitting: Family Medicine

## 2015-10-16 VITALS — BP 107/71 | HR 66 | Temp 97.8°F | Resp 20 | Ht 63.0 in | Wt 194.0 lb

## 2015-10-16 DIAGNOSIS — Z860100 Personal history of colon polyps, unspecified: Secondary | ICD-10-CM

## 2015-10-16 DIAGNOSIS — R159 Full incontinence of feces: Secondary | ICD-10-CM

## 2015-10-16 DIAGNOSIS — Z131 Encounter for screening for diabetes mellitus: Secondary | ICD-10-CM

## 2015-10-16 DIAGNOSIS — Z0000 Encounter for general adult medical examination without abnormal findings: Secondary | ICD-10-CM

## 2015-10-16 DIAGNOSIS — Z9049 Acquired absence of other specified parts of digestive tract: Secondary | ICD-10-CM

## 2015-10-16 DIAGNOSIS — Z9884 Bariatric surgery status: Secondary | ICD-10-CM

## 2015-10-16 DIAGNOSIS — K529 Noninfective gastroenteritis and colitis, unspecified: Secondary | ICD-10-CM

## 2015-10-16 DIAGNOSIS — E785 Hyperlipidemia, unspecified: Secondary | ICD-10-CM

## 2015-10-16 DIAGNOSIS — Z9889 Other specified postprocedural states: Secondary | ICD-10-CM

## 2015-10-16 DIAGNOSIS — Z1159 Encounter for screening for other viral diseases: Secondary | ICD-10-CM

## 2015-10-16 MED ORDER — ZOSTER VACCINE LIVE 19400 UNT/0.65ML ~~LOC~~ SUSR: 0.6500 mL | Freq: Once | SUBCUTANEOUS | Status: DC

## 2015-10-16 NOTE — Patient Instructions (Signed)
Sciatica Sciatica is pain, weakness, numbness, or tingling along the path of the sciatic nerve. The nerve starts in the lower back and runs down the back of each leg. The nerve controls the muscles in the lower leg and in the back of the knee, while also providing sensation to the back of the thigh, lower leg, and the sole of your foot. Sciatica is a symptom of another medical condition. For instance, nerve damage or certain conditions, such as a herniated disk or bone spur on the spine, pinch or put pressure on the sciatic nerve. This causes the pain, weakness, or other sensations normally associated with sciatica. Generally, sciatica only affects one side of the body. CAUSES   Herniated or slipped disc.  Degenerative disk disease.  A pain disorder involving the narrow muscle in the buttocks (piriformis syndrome).  Pelvic injury or fracture.  Pregnancy.  Tumor (rare). SYMPTOMS  Symptoms can vary from mild to very severe. The symptoms usually travel from the low back to the buttocks and down the back of the leg. Symptoms can include:  Mild tingling or dull aches in the lower back, leg, or hip.  Numbness in the back of the calf or sole of the foot.  Burning sensations in the lower back, leg, or hip.  Sharp pains in the lower back, leg, or hip.  Leg weakness.  Severe back pain inhibiting movement. These symptoms may get worse with coughing, sneezing, laughing, or prolonged sitting or standing. Also, being overweight may worsen symptoms. DIAGNOSIS  Your caregiver will perform a physical exam to look for common symptoms of sciatica. He or she may ask you to do certain movements or activities that would trigger sciatic nerve pain. Other tests may be performed to find the cause of the sciatica. These may include:  Blood tests.  X-rays.  Imaging tests, such as an MRI or CT scan. TREATMENT  Treatment is directed at the cause of the sciatic pain. Sometimes, treatment is not necessary  and the pain and discomfort goes away on its own. If treatment is needed, your caregiver may suggest:  Over-the-counter medicines to relieve pain.  Prescription medicines, such as anti-inflammatory medicine, muscle relaxants, or narcotics.  Applying heat or ice to the painful area.  Steroid injections to lessen pain, irritation, and inflammation around the nerve.  Reducing activity during periods of pain.  Exercising and stretching to strengthen your abdomen and improve flexibility of your spine. Your caregiver may suggest losing weight if the extra weight makes the back pain worse.  Physical therapy.  Surgery to eliminate what is pressing or pinching the nerve, such as a bone spur or part of a herniated disk. HOME CARE INSTRUCTIONS   Only take over-the-counter or prescription medicines for pain or discomfort as directed by your caregiver.  Apply ice to the affected area for 20 minutes, 3-4 times a day for the first 48-72 hours. Then try heat in the same way.  Exercise, stretch, or perform your usual activities if these do not aggravate your pain.  Attend physical therapy sessions as directed by your caregiver.  Keep all follow-up appointments as directed by your caregiver.  Do not wear high heels or shoes that do not provide proper support.  Check your mattress to see if it is too soft. A firm mattress may lessen your pain and discomfort. SEEK IMMEDIATE MEDICAL CARE IF:   You lose control of your bowel or bladder (incontinence).  You have increasing weakness in the lower back, pelvis, buttocks,   or legs.  You have redness or swelling of your back.  You have a burning sensation when you urinate.  You have pain that gets worse when you lie down or awakens you at night.  Your pain is worse than you have experienced in the past.  Your pain is lasting longer than 4 weeks.  You are suddenly losing weight without reason. MAKE SURE YOU:  Understand these  instructions.  Will watch your condition.  Will get help right away if you are not doing well or get worse.   This information is not intended to replace advice given to you by your health care provider. Make sure you discuss any questions you have with your health care provider.   Document Released: 04/05/2001 Document Revised: 12/31/2014 Document Reviewed: 08/21/2011 Elsevier Interactive Patient Education 2016 [place] The sciatic nerve runs from the back down the leg and is responsible for sensation and control of the muscles in the back (posterior) side of the thigh, lower leg, and foot. Sciatica is a condition that is characterized by inflammation of this nerve.  SYMPTOMS   Signs of nerve damage, including numbness and/or weakness along the posterior side of the lower extremity.  Pain in the back of the thigh that may also travel down the leg.  Pain that worsens when sitting for long periods of time.  Occasionally, pain in the back or buttock. CAUSES  Inflammation of the sciatic nerve is the cause of sciatica. The inflammation is due to something irritating the nerve. Common sources of irritation include:  Sitting for long periods of time.  Direct trauma to the nerve.  Arthritis of the spine.  Herniated or ruptured disk.  Slipping of the vertebrae (spondylolisthesis).  Pressure from soft tissues, such as muscles or ligament-like tissue (fascia). RISK INCREASES WITH:  Sports that place pressure or stress on the spine (football or weightlifting).  Poor strength and flexibility.  Failure to warm up properly before activity.  Family history of low back pain or disk disorders.  Previous back injury or surgery.  Poor body mechanics, especially when lifting, or poor posture. PREVENTION   Warm up and stretch properly before activity.  Maintain physical fitness:  Strength, flexibility, and endurance.  Cardiovascular fitness.  Learn  and use proper technique, especially with posture and lifting. When possible, have coach correct improper technique.  Avoid activities that place stress on the spine. PROGNOSIS If treated properly, then sciatica usually resolves within 6 weeks. However, occasionally surgery is necessary.  RELATED COMPLICATIONS   Permanent nerve damage, including pain, numbness, tingle, or weakness.  Chronic back pain.  Risks of surgery: infection, bleeding, nerve damage, or damage to surrounding tissues. TREATMENT Treatment initially involves resting from any activities that aggravate your symptoms. The use of ice and medication may help reduce pain and inflammation. The use of strengthening and stretching exercises may help reduce pain with activity. These exercises may be performed at home or with referral to a therapist. A therapist may recommend further treatments, such as transcutaneous electronic nerve stimulation (TENS) or ultrasound. Your caregiver may recommend corticosteroid injections to help reduce inflammation of the sciatic nerve. If symptoms persist despite non-surgical (conservative) treatment, then surgery may be recommended. MEDICATION  If pain medication is necessary, then nonsteroidal anti-inflammatory medications, such as aspirin and ibuprofen, or other minor pain relievers, such as acetaminophen, are often recommended.  Do not take pain medication for 7 days before surgery.  Prescription pain relievers may be given  if deemed necessary by your caregiver. Use only as directed and only as much as you need.  Ointments applied to the skin may be helpful.  Corticosteroid injections may be given by your caregiver. These injections should be reserved for the most serious cases, because they may only be given a certain number of times. HEAT AND COLD  Cold treatment (icing) relieves pain and reduces inflammation. Cold treatment should be applied for 10 to 15 minutes every 2 to 3 hours for  inflammation and pain and immediately after any activity that aggravates your symptoms. Use ice packs or massage the area with a piece of ice (ice massage).  Heat treatment may be used prior to performing the stretching and strengthening activities prescribed by your caregiver, physical therapist, or athletic trainer. Use a heat pack or soak the injury in warm water. SEEK MEDICAL CARE IF:  Treatment seems to offer no benefit, or the condition worsens.  Any medications produce adverse side effects. EXERCISES  RANGE OF MOTION (ROM) AND STRETCHING EXERCISES - Sciatica Most people with sciatic will find that their symptoms worsen with either excessive bending forward (flexion) or arching at the low back (extension). The exercises which will help resolve your symptoms will focus on the opposite motion. Your physician, physical therapist or athletic trainer will help you determine which exercises will be most helpful to resolve your low back pain. Do not complete any exercises without first consulting with your clinician. Discontinue any exercises which worsen your symptoms until you speak to your clinician. If you have pain, numbness or tingling which travels down into your buttocks, leg or foot, the goal of the therapy is for these symptoms to move closer to your back and eventually resolve. Occasionally, these leg symptoms will get better, but your low back pain may worsen; this is typically an indication of progress in your rehabilitation. Be certain to be very alert to any changes in your symptoms and the activities in which you participated in the 24 hours prior to the change. Sharing this information with your clinician will allow him/her to most efficiently treat your condition. These exercises may help you when beginning to rehabilitate your injury. Your symptoms may resolve with or without further involvement from your physician, physical therapist or athletic trainer. While completing these  exercises, remember:   Restoring tissue flexibility helps normal motion to return to the joints. This allows healthier, less painful movement and activity.  An effective stretch should be held for at least 30 seconds.  A stretch should never be painful. You should only feel a gentle lengthening or release in the stretched tissue. FLEXION RANGE OF MOTION AND STRETCHING EXERCISES: STRETCH - Flexion, Single Knee to Chest   Lie on a firm bed or floor with both legs extended in front of you.  Keeping one leg in contact with the floor, bring your opposite knee to your chest. Hold your leg in place by either grabbing behind your thigh or at your knee.  Pull until you feel a gentle stretch in your low back. Hold __________ seconds.  Slowly release your grasp and repeat the exercise with the opposite side. Repeat __________ times. Complete this exercise __________ times per day.  STRETCH - Flexion, Double Knee to Chest  Lie on a firm bed or floor with both legs extended in front of you.  Keeping one leg in contact with the floor, bring your opposite knee to your chest.  Tense your stomach muscles to support your back and then  lift your other knee to your chest. Hold your legs in place by either grabbing behind your thighs or at your knees.  Pull both knees toward your chest until you feel a gentle stretch in your low back. Hold __________ seconds.  Tense your stomach muscles and slowly return one leg at a time to the floor. Repeat __________ times. Complete this exercise __________ times per day.  STRETCH - Low Trunk Rotation   Lie on a firm bed or floor. Keeping your legs in front of you, bend your knees so they are both pointed toward the ceiling and your feet are flat on the floor.  Extend your arms out to the side. This will stabilize your upper body by keeping your shoulders in contact with the floor.  Gently and slowly drop both knees together to one side until you feel a gentle  stretch in your low back. Hold for __________ seconds.  Tense your stomach muscles to support your low back as you bring your knees back to the starting position. Repeat the exercise to the other side. Repeat __________ times. Complete this exercise __________ times per day  EXTENSION RANGE OF MOTION AND FLEXIBILITY EXERCISES: STRETCH - Extension, Prone on Elbows  Lie on your stomach on the floor, a bed will be too soft. Place your palms about shoulder width apart and at the height of your head.  Place your elbows under your shoulders. If this is too painful, stack pillows under your chest.  Allow your body to relax so that your hips drop lower and make contact more completely with the floor.  Hold this position for __________ seconds.  Slowly return to lying flat on the floor. Repeat __________ times. Complete this exercise __________ times per day.  RANGE OF MOTION - Extension, Prone Press Ups  Lie on your stomach on the floor, a bed will be too soft. Place your palms about shoulder width apart and at the height of your head.  Keeping your back as relaxed as possible, slowly straighten your elbows while keeping your hips on the floor. You may adjust the placement of your hands to maximize your comfort. As you gain motion, your hands will come more underneath your shoulders.  Hold this position __________ seconds.  Slowly return to lying flat on the floor. Repeat __________ times. Complete this exercise __________ times per day.  STRENGTHENING EXERCISES - Sciatica  These exercises may help you when beginning to rehabilitate your injury. These exercises should be done near your "sweet spot." This is the neutral, low-back arch, somewhere between fully rounded and fully arched, that is your least painful position. When performed in this safe range of motion, these exercises can be used for people who have either a flexion or extension based injury. These exercises may resolve your symptoms  with or without further involvement from your physician, physical therapist or athletic trainer. While completing these exercises, remember:   Muscles can gain both the endurance and the strength needed for everyday activities through controlled exercises.  Complete these exercises as instructed by your physician, physical therapist or athletic trainer. Progress with the resistance and repetition exercises only as your caregiver advises.  You may experience muscle soreness or fatigue, but the pain or discomfort you are trying to eliminate should never worsen during these exercises. If this pain does worsen, stop and make certain you are following the directions exactly. If the pain is still present after adjustments, discontinue the exercise until you can discuss the trouble with  your clinician. STRENGTHENING - Deep Abdominals, Pelvic Tilt   Lie on a firm bed or floor. Keeping your legs in front of you, bend your knees so they are both pointed toward the ceiling and your feet are flat on the floor.  Tense your lower abdominal muscles to press your low back into the floor. This motion will rotate your pelvis so that your tail bone is scooping upwards rather than pointing at your feet or into the floor.  With a gentle tension and even breathing, hold this position for __________ seconds. Repeat __________ times. Complete this exercise __________ times per day.  STRENGTHENING - Abdominals, Crunches   Lie on a firm bed or floor. Keeping your legs in front of you, bend your knees so they are both pointed toward the ceiling and your feet are flat on the floor. Cross your arms over your chest.  Slightly tip your chin down without bending your neck.  Tense your abdominals and slowly lift your trunk high enough to just clear your shoulder blades. Lifting higher can put excessive stress on the low back and does not further strengthen your abdominal muscles.  Control your return to the starting  position. Repeat __________ times. Complete this exercise __________ times per day.  STRENGTHENING - Quadruped, Opposite UE/LE Lift  Assume a hands and knees position on a firm surface. Keep your hands under your shoulders and your knees under your hips. You may place padding under your knees for comfort.  Find your neutral spine and gently tense your abdominal muscles so that you can maintain this position. Your shoulders and hips should form a rectangle that is parallel with the floor and is not twisted.  Keeping your trunk steady, lift your right hand no higher than your shoulder and then your left leg no higher than your hip. Make sure you are not holding your breath. Hold this position __________ seconds.  Continuing to keep your abdominal muscles tense and your back steady, slowly return to your starting position. Repeat with the opposite arm and leg. Repeat __________ times. Complete this exercise __________ times per day.  STRENGTHENING - Abdominals and Quadriceps, Straight Leg Raise   Lie on a firm bed or floor with both legs extended in front of you.  Keeping one leg in contact with the floor, bend the other knee so that your foot can rest flat on the floor.  Find your neutral spine, and tense your abdominal muscles to maintain your spinal position throughout the exercise.  Slowly lift your straight leg off the floor about 6 inches for a count of 15, making sure to not hold your breath.  Still keeping your neutral spine, slowly lower your leg all the way to the floor. Repeat this exercise with each leg __________ times. Complete this exercise __________ times per day. POSTURE AND BODY MECHANICS CONSIDERATIONS - Sciatica Keeping correct posture when sitting, standing or completing your activities will reduce the stress put on different body tissues, allowing injured tissues a chance to heal and limiting painful experiences. The following are general guidelines for improved posture.  Your physician or physical therapist will provide you with any instructions specific to your needs. While reading these guidelines, remember:  The exercises prescribed by your provider will help you have the flexibility and strength to maintain correct postures.  The correct posture provides the optimal environment for your joints to work. All of your joints have less wear and tear when properly supported by a spine with good posture.  This means you will experience a healthier, less painful body.  Correct posture must be practiced with all of your activities, especially prolonged sitting and standing. Correct posture is as important when doing repetitive low-stress activities (typing) as it is when doing a single heavy-load activity (lifting). RESTING POSITIONS Consider which positions are most painful for you when choosing a resting position. If you have pain with flexion-based activities (sitting, bending, stooping, squatting), choose a position that allows you to rest in a less flexed posture. You would want to avoid curling into a fetal position on your side. If your pain worsens with extension-based activities (prolonged standing, working overhead), avoid resting in an extended position such as sleeping on your stomach. Most people will find more comfort when they rest with their spine in a more neutral position, neither too rounded nor too arched. Lying on a non-sagging bed on your side with a pillow between your knees, or on your back with a pillow under your knees will often provide some relief. Keep in mind, being in any one position for a prolonged period of time, no matter how correct your posture, can still lead to stiffness. PROPER SITTING POSTURE In order to minimize stress and discomfort on your spine, you must sit with correct posture Sitting with good posture should be effortless for a healthy body. Returning to good posture is a gradual process. Many people can work toward this most  comfortably by using various supports until they have the flexibility and strength to maintain this posture on their own. When sitting with proper posture, your ears will fall over your shoulders and your shoulders will fall over your hips. You should use the back of the chair to support your upper back. Your low back will be in a neutral position, just slightly arched. You may place a small pillow or folded towel at the base of your low back for support.  When working at a desk, create an environment that supports good, upright posture. Without extra support, muscles fatigue and lead to excessive strain on joints and other tissues. Keep these recommendations in mind: CHAIR:   A chair should be able to slide under your desk when your back makes contact with the back of the chair. This allows you to work closely.  The chair's height should allow your eyes to be level with the upper part of your monitor and your hands to be slightly lower than your elbows. BODY POSITION  Your feet should make contact with the floor. If this is not possible, use a foot rest.  Keep your ears over your shoulders. This will reduce stress on your neck and low back. INCORRECT SITTING POSTURES   If you are feeling tired and unable to assume a healthy sitting posture, do not slouch or slump. This puts excessive strain on your back tissues, causing more damage and pain. Healthier options include:  Using more support, like a lumbar pillow.  Switching tasks to something that requires you to be upright or walking.  Talking a brief walk.  Lying down to rest in a neutral-spine position. PROLONGED STANDING WHILE SLIGHTLY LEANING FORWARD  When completing a task that requires you to lean forward while standing in one place for a long time, place either foot up on a stationary 2-4 inch high object to help maintain the best posture. When both feet are on the ground, the low back tends to lose its slight inward curve. If this  curve flattens (or becomes too large), then  the back and your other joints will experience too much stress, fatigue more quickly and can cause pain.  CORRECT STANDING POSTURES Proper standing posture should be assumed with all daily activities, even if they only take a few moments, like when brushing your teeth. As in sitting, your ears should fall over your shoulders and your shoulders should fall over your hips. You should keep a slight tension in your abdominal muscles to brace your spine. Your tailbone should point down to the ground, not behind your body, resulting in an over-extended swayback posture.  INCORRECT STANDING POSTURES  Common incorrect standing postures include a forward head, locked knees and/or an excessive swayback. WALKING Walk with an upright posture. Your ears, shoulders and hips should all line-up. PROLONGED ACTIVITY IN A FLEXED POSITION When completing a task that requires you to bend forward at your waist or lean over a low surface, try to find a way to stabilize 3 of 4 of your limbs. You can place a hand or elbow on your thigh or rest a knee on the surface you are reaching across. This will provide you more stability so that your muscles do not fatigue as quickly. By keeping your knees relaxed, or slightly bent, you will also reduce stress across your low back. CORRECT LIFTING TECHNIQUES DO :   Assume a wide stance. This will provide you more stability and the opportunity to get as close as possible to the object which you are lifting.  Tense your abdominals to brace your spine; then bend at the knees and hips. Keeping your back locked in a neutral-spine position, lift using your leg muscles. Lift with your legs, keeping your back straight.  Test the weight of unknown objects before attempting to lift them.  Try to keep your elbows locked down at your sides in order get the best strength from your shoulders when carrying an object.  Always ask for help when lifting  heavy or awkward objects. INCORRECT LIFTING TECHNIQUES DO NOT:   Lock your knees when lifting, even if it is a small object.  Bend and twist. Pivot at your feet or move your feet when needing to change directions.  Assume that you cannot safely pick up a paperclip without proper posture.   This information is not intended to replace advice given to you by your health care provider. Make sure you discuss any questions you have with your health care provider.   Document Released: 04/11/2005 Document Revised: 08/26/2014 Document Reviewed: 07/24/2008 Elsevier Interactive Patient Education [employer].

## 2015-10-16 NOTE — Progress Notes (Signed)
Patient ID: Kendra Ball, female   DOB: Feb 22, 1946, 70 y.o.   MRN: [id number] Medicare AWV, subsequent.     History of Present Ilness: Kendra Ball, 70 y.o. , female presents today for Medicare wellness visit.  Vital Signs: BP 107/71 mmHg  Pulse 66  Temp(Src) 97.8 F (36.6 C)  Resp 20  Ht 5\' 3"  (1.6 m)  Wt 194 lb (87.998 kg)  BMI 34.37 kg/m2  SpO2 96% List of providers/suppliers:  Updated in pts records (snapshot) Patient Care Team    Relationship Specialty Notifications Start End  Ma Hillock, DO PCP - General Family Medicine  12/30/14   Juanita Craver, MD Consulting Physician Gastroenterology  05/09/14   Excell Seltzer, MD Consulting Physician General Surgery  05/09/14   Brandon Melnick, [place]  Psychology  10/16/15      Past medical, surgical, family and social histories reviewed (including experiences with illnesses, hospital stays, operations, injuries, and treatments):  Past Medical History  Diagnosis Date  . DEPRESSION 08/26/2008  . HYPERTENSION 08/26/2008  . HYPOTHYROIDISM 08/26/2008  . OSTEOPENIA 08/26/2008  . TOBACCO USER 12/25/2009  . Obesity   . Hx of colonic polyps     First noted on  colonoscopy 2012  . Hypercholesteremia    All allergies reviewed Allergies  Allergen Reactions  . Morphine Sulfate     Swelling around injection area   Past Surgical History  Procedure Laterality Date  . Gastric bypass    . Cesarean section    . Dilation and curettage of uterus      x2  . Forearm fracture surgery      right  . Tonsillectomy    . Colon resection N/A 05/09/2014    Procedure: LAPAROSCOPIC HAND-ASSISTED EXTENDED RIGHT COLECTOMY, LAPAROSCOPIC LYSIS OF ADHESIONS, SPLENIC FLEXURE MOBILIZATION.;  Surgeon: [name], MD;  Location: [facility];  Service: General;  Laterality: N/A;   Family History  Problem Relation Age of Onset  . Cancer Mother     colon  . Dementia Mother   . Depression Mother   . Heart disease Father 43  . Heart disease Sister 12    stents,  pacemaker  . Depression Sister   . Heart disease Brother 38    CABG, MI  . Depression Brother   . Obesity Daughter   . Suicidality Daughter   . Bipolar disorder Daughter   . Obesity Son   . Mental illness Daughter     bipolar  . Obesity Daughter   . Dementia Paternal Aunt   . Depression Sister   . Alcohol abuse Maternal Uncle    Social History   Social History Narrative   Ms. Guia lives in [place] with her husband. She has 3 grown children and several grand children. She has joint custody of 3 of her grand children as her daughter has bipolar disorder and has needed assistance with children in past.    All medications verified   Medication List       This list is accurate as of: 10/16/15  9:07 AM.  Always use your most recent med list.               alendronate 70 MG tablet  Commonly known as:  FOSAMAX  Take 70 mg by mouth once a week. Reported on 10/06/2015     ALIGN 4 MG Caps  Take 1 capsule (4 mg total) by mouth daily.     aspirin 81 MG tablet  Take 81 mg by mouth daily.  budesonide-formoterol 160-4.5 MCG/ACT inhaler  Commonly known as:  SYMBICORT  Inhale 2 puffs into the lungs 2 (two) times daily.     buPROPion 100 MG tablet  Commonly known as:  WELLBUTRIN  Take 100 mg by mouth daily.     busPIRone 10 MG tablet  Commonly known as:  BUSPAR  Take 1 tablet (10 mg total) by mouth 3 (three) times daily.     ferrous fumarate 325 (106 Fe) MG Tabs tablet  Commonly known as:  HEMOCYTE - 106 mg FE  Take 1 tablet by mouth daily. Reported on 10/06/2015     levothyroxine 100 MCG tablet  Commonly known as:  SYNTHROID, LEVOTHROID  Take 1 tablet (100 mcg total) by mouth daily before breakfast.     lisinopril 10 MG tablet  Commonly known as:  PRINIVIL,ZESTRIL  Take 1 tablet (10 mg total) by mouth daily.     Magnesium 250 MG Tabs  Take 250 mg by mouth 2 (two) times daily.     simvastatin 10 MG tablet  Commonly known as:  ZOCOR  Take 1 tablet (10 mg  total) by mouth at bedtime.     traZODone 150 MG tablet  Commonly known as:  DESYREL  Take 1 tablet (150 mg total) by mouth at bedtime.     venlafaxine XR 75 MG 24 hr capsule  Commonly known as:  EFFEXOR-XR  Take 3 capsules (225 mg total) by mouth daily with breakfast.     VITAMIN B-12 PO  Take 1 tablet by mouth daily.         Exercise/Diet: Current Exercise Habits: The patient does not participate in regular exercise at present   Diet: regular.   Functional Status Survey: Is the patient deaf or have difficulty hearing?: No Does the patient have difficulty seeing, even when wearing glasses/contacts?: No Does the patient have difficulty concentrating, remembering, or making decisions?: Yes Does the patient have difficulty walking or climbing stairs?: Yes (hip pain) Does the patient have difficulty dressing or bathing?: No Does the patient have difficulty doing errands alone such as visiting a doctor's office or shopping?: No  Fall Risk  10/16/2015 09/10/2014 08/13/2013  Falls in the past year? No No No  Risk for fall due to : Impaired balance/gait - -     Cognitive assessment: No barriers.  Hearing: No barriers.   Depression Screening: Depression screen [facility] 2/9 10/16/2015 09/10/2015 05/20/2015 09/10/2014 02/19/2014  Decreased Interest 0 1 3 0 0  Down, Depressed, Hopeless 0 1 3 0 3  PHQ - 2 Score 0 2 6 0 3  Altered sleeping - 3 1 - 0  Tired, decreased energy - 1 3 - 3  Change in appetite - 1 0 - 0  Feeling bad or failure about yourself  - 1 2 - 0  Trouble concentrating - 1 1 - 3  Moving slowly or fidgety/restless - 0 0 - 0  Suicidal thoughts - 0 0 - 0  PHQ-9 Score - 9 13 - 9  Difficult doing work/chores - Not difficult at all Very difficult - -    Advanced Care Planning: discussed and packet given today.   Health maintenance:  Immunizations: Immunization History  Administered Date(s) Administered  . Influenza Whole 05/07/2010  . Influenza,inj,Quad PF,36+ Mos  03/13/2014, 12/30/2014  . PPD Test 09/20/2013  . Pneumococcal Conjugate-13 09/10/2014  . Pneumococcal Polysaccharide-23 08/13/2013  . Tdap 08/08/2005    Info -Pneumococcal (once in a lifetime after age 58); Seasonal Influenza annually; Tetanus (  Tdap ) once every 10 years- not covered by medicare; Zostavax - Shingles vaccine - not covered by Part A or B   Bone mass measurements Date of Study: 10/07/2014 [facility] bone density study: osteoporosis  On fosamax weekly treatment. Taking 50000  Vit d weekly.  Info:  USPSTF: "B" recommendation to screen, >2 yr interval advised. Coverage: Medicare patients at risk for developing Osteoporosis. Medicare covers every 24 months or based on previous test.   Cardiovascular Screening Blood Tests . Lipid Panel     Component Value Date/Time   CHOL 191 09/10/2014 0959   TRIG 120.0 09/10/2014 0959   HDL 83.80 09/10/2014 0959   CHOLHDL 2 09/10/2014 0959   VLDL 24.0 09/10/2014 0959   LDLCALC 83 09/10/2014 0959   LDLDIRECT 133.5 08/09/2012 1036   Info:  USPSTF: "C" recommendation for no screening unless patient has risk factors, "A" recommendation to screen if has risk factors: HTN, DM, ASCVD, Fam Hx of ASCVD in men <50, women <60, Tobacco use, BMI > 30. USPSTF prefers Total Cholesterol and HDL for screening. USPSTF advises every 5 years Coverage: All asymptomic Medicare patients (No fast required for total and HDL measurement. 12-hr fast is required if lipid panel done)  Diabetes Screening Tests  Recent Labs Lab 10/13/15 1605  NA 137  K 4.5  CL 103  CO2 24  GLUCOSE 83  BUN 14  CREATININE 0.74  CALCIUM 9.3  MG 2.1   Lab Results  Component Value Date   HGBA1C 5.5 05/10/2014   Info USPSTF: "B" recommendation to screen if patient has sustained BP > 135/80; ([facility] covers 2 screening tests per year for patient diagnosed with pre-diabetes; 1 screening per year if previously tested, but not diagnosed with pre-diabetes or if never  tested) Coverage: - Medicare patients with certain risk factors for diabetes or diagnosed with pre-diabetes (patients previously diagnosed with diabetes aren't eligible for benefit)  Diabetes Self-Mgmt Training and Medical Nutrition Therapy  Date previously done: N/A Info: (Up to 10 hrs of initial training within a continuous 32mo period; subsequent yrs up to 2hrs follow-up training each year after initial year) ;  Coverage: Medicare patients at risk for complications from diabetes, recently diagnosed with diabetes or previously diagnosed with diabetes (must certify DSMT need)  Colorectal Cancer Screening Last Colonoscopy: due, referral placed, prior polyps Done by:  New referral placed.  Info USPSTF: "A" for CRC screening, ages 67-74;  3 screening methods: USPSTF says no method is preferred - Annual high-sensitivity FOBT OR - Flex sig Q 5 y + annual FOBT OR - Colonoscopy Q 10 y;  Medicare covers:  -Flexible sigmoidoscopy (55yrs, or once every 10 yrs after a screening colonoscopy) -Screening Colonoscopy (every 5 yrs if at high risk; every 10 yrs otherwise) -Fecal Occult Blood Test (annually) or Cologuard q3 years Coverage: Medicare covers for patients age 85 and up - Screening colonoscopy: For those at high risk; no minimum age  Glaucoma Screening/vision screen:  Date of Last ophthalmology evaluation: 2015, [facility].  No fhx of glaucoma. She has cataracts.  Info USPSTF: "I" recommendation - Insufficent evidence to recommend for or against screening (Medicare coveres annually for patients in a high risk group);  Coverage: Medicare covers for patients with diabetes mellitus, family history of glaucoma, African-Americans age 53 and over, or 52 age 65 and up  Screening Pap tests and Pevic Exam Last Pap and Pelvic exam: N/A Info:  USPSTF: "D" recommendation against cervical cancer screening for women > age 48 with adequate  screening history not at high risk for  cervical cancer.  (Medicare covers annually if high-risk, or childbearing age with abnormal Pap test within past 3 yrs; every 24 months for all other women); Medicare covers for all female Medicare patients  Screening Mammography 10/07/2014- completed [facility]1 Info:  USPSTF "B" recommendation for mammography every 2 years ages 92-74;  (Medicare covers annually); Medicare covers for all female patients 27 or older  AAA/EKG Screening: completed if indicated and medicare welcome visit.   Alcohol abuse screening: completed if indicated  STIs screening: Completed if indicated  Assessment and Plan: Lipid panel, glucose, advanced directives, GI referral for colonoscopy.  - TSH lab appt 8 weeks.   Education and Counseling Provided:  See after visit summary that was printed and given to patient 1. Tobacco abuse counseling 2. Alcohol abuse counseling 3. STIs counseling 4. Referrals made  Referrals/orders made if indicated: 1. IBT (Intensive Behavior Therapy) for Cardiovascular disease 2. IBT for Obesity 3. MNT (Medical Nutrition Therapy)  4. Behavioral Counseling for Alcohol abuse  5. HIBT (High Intensity Behavioral Counseling) to prevent STIs (Sexually Transmitted Infections) 6. [place] for AAA screening - IPPE only 7. EKG screening- IPPE only 8. Low dose CT- lung cancer screen 9. Pelvic/PAP exam 10. PSA/Prostate 11. Screen mammogram 12. HIV screen 13. Hep C screen 14. Glaucoma screen 15. Diabetes screen 16. Colon cancer screen 17. DEXA  Vaccinations Administered: 1. Influenza 2. PPV23/prevnar 13 3. Hepatitis B 4. Tdap - print script 5. Shingle's vaccination- print script  Electronically Signed by: Howard Pouch, DO [place]

## 2015-10-19 ENCOUNTER — Other Ambulatory Visit (HOSPITAL_COMMUNITY): Payer: 59 | Admitting: Psychology

## 2015-10-19 ENCOUNTER — Other Ambulatory Visit: Payer: Self-pay | Admitting: *Deleted

## 2015-10-19 ENCOUNTER — Other Ambulatory Visit (HOSPITAL_COMMUNITY): Payer: Self-pay | Admitting: Medical

## 2015-10-19 DIAGNOSIS — Z9884 Bariatric surgery status: Secondary | ICD-10-CM

## 2015-10-19 DIAGNOSIS — F1020 Alcohol dependence, uncomplicated: Secondary | ICD-10-CM

## 2015-10-19 DIAGNOSIS — F331 Major depressive disorder, recurrent, moderate: Secondary | ICD-10-CM

## 2015-10-19 DIAGNOSIS — E785 Hyperlipidemia, unspecified: Secondary | ICD-10-CM

## 2015-10-19 LAB — OVA AND PARASITE EXAMINATION: OP: NONE SEEN

## 2015-10-19 MED ORDER — SIMVASTATIN 10 MG PO TABS: 10.0000 mg | ORAL_TABLET | Freq: Every day | ORAL | Status: DC

## 2015-10-19 NOTE — Telephone Encounter (Signed)
Rx for zocor sent

## 2015-10-20 ENCOUNTER — Other Ambulatory Visit: Payer: Self-pay | Admitting: *Deleted

## 2015-10-20 ENCOUNTER — Encounter: Payer: Self-pay | Admitting: Internal Medicine

## 2015-10-20 DIAGNOSIS — I10 Essential (primary) hypertension: Secondary | ICD-10-CM

## 2015-10-20 LAB — GASTROINTESTINAL PATHOGEN PANEL PCR
C. difficile Tox A/B, PCR: NOT DETECTED
Campylobacter, PCR: NOT DETECTED
Cryptosporidium, PCR: NOT DETECTED
E coli (ETEC) LT/ST PCR: NOT DETECTED
E coli (STEC) stx1/stx2, PCR: NOT DETECTED
E coli 0157, PCR: NOT DETECTED
Giardia lamblia, PCR: NOT DETECTED
Norovirus, PCR: NOT DETECTED
Rotavirus A, PCR: NOT DETECTED
Salmonella, PCR: NOT DETECTED
Shigella, PCR: NOT DETECTED

## 2015-10-20 MED ORDER — LISINOPRIL 10 MG PO TABS: 10.0000 mg | ORAL_TABLET | Freq: Every day | ORAL | Status: DC

## 2015-10-20 NOTE — Telephone Encounter (Signed)
Lisinopril refilled.

## 2015-10-21 ENCOUNTER — Other Ambulatory Visit (HOSPITAL_COMMUNITY): Payer: 59 | Admitting: Psychology

## 2015-10-21 ENCOUNTER — Telehealth: Payer: Self-pay | Admitting: Family Medicine

## 2015-10-21 ENCOUNTER — Other Ambulatory Visit: Payer: Medicare Other

## 2015-10-21 ENCOUNTER — Encounter (HOSPITAL_COMMUNITY): Payer: Self-pay | Admitting: Psychology

## 2015-10-21 DIAGNOSIS — F1020 Alcohol dependence, uncomplicated: Secondary | ICD-10-CM | POA: Diagnosis not present

## 2015-10-21 DIAGNOSIS — F331 Major depressive disorder, recurrent, moderate: Secondary | ICD-10-CM

## 2015-10-21 LAB — PRESCRIPTION ABUSE MONITORING 17P, URINE
6-Acetylmorphine, Urine: NEGATIVE ng/mL
Amphetamine Scrn, Ur: NEGATIVE ng/mL
BARBITURATE SCREEN URINE: NEGATIVE ng/mL
BENZODIAZEPINE SCREEN, URINE: NEGATIVE ng/mL
Buprenorphine, Urine: NEGATIVE ng/mL
CANNABINOIDS UR QL SCN: NEGATIVE ng/mL
Carisoprodol/Meprobamate, Ur: NEGATIVE ng/mL
Cocaine (Metab) Scrn, Ur: NEGATIVE ng/mL
Creatinine(Crt), U: 41.8 mg/dL (ref 20.0–300.0)
Fentanyl, Urine: NEGATIVE pg/mL
MDMA Screen, Urine: NEGATIVE ng/mL
Meperidine Screen, Urine: NEGATIVE ng/mL
Methadone Screen, Urine: NEGATIVE ng/mL
Nitrite Urine, Quantitative: NEGATIVE ug/mL
OXYCODONE+OXYMORPHONE UR QL SCN: NEGATIVE ng/mL
Opiate Scrn, Ur: NEGATIVE ng/mL
Ph of Urine: 5.7 (ref 4.5–8.9)
Phencyclidine Qn, Ur: NEGATIVE ng/mL
Propoxyphene Scrn, Ur: NEGATIVE ng/mL
SPECIFIC GRAVITY: 1.011
Tapentadol, Urine: NEGATIVE ng/mL
Tramadol Screen, Urine: NEGATIVE ng/mL

## 2015-10-21 LAB — LIPID PANEL
Cholesterol: 164 mg/dL (ref 0–200)
HDL: 63.3 mg/dL (ref >39.00)
LDL Cholesterol: 82 mg/dL (ref 0–99)
NonHDL: 100.64
Total CHOL/HDL Ratio: 3
Triglycerides: 94 mg/dL (ref 0.0–149.0)
VLDL: 18.8 mg/dL (ref 0.0–40.0)

## 2015-10-21 LAB — GLUCOSE, RANDOM: Glucose, Bld: 92 mg/dL (ref 70–99)

## 2015-10-21 LAB — ETHYL GLUCURONIDE, URINE: Ethyl Glucuronide Screen, Ur: NEGATIVE ng/mL

## 2015-10-21 NOTE — Progress Notes (Signed)
    Daily Group Progress Note  Program: CD-IOP   Group Time: 1-2:30 pm  Participation Level: Active  Behavioral Response: Appropriate and Sharing  Type of Therapy: Process Group  Topic: Process: The first half of group was spent in process. Members shared about current issues and concerns that they are facing in early recovery. The disclosures were compelling and members were engaged and active in the session. One member reported he had gone into his car, where he used to use frequently, and found a "torch" (a lighter) that he had used to smoke drugs. It had stirred up some powerful emotion and he had sought out others in recovery to reassure him. Drug test results were returned to group members and everyone had reportedly remained abstinent since we last met.   Group Time: 2:45-4pm  Participation Level: Active  Behavioral Response: Appropriate  Type of Therapy: Psycho-Ed Group  Topic: Psycho-Ed: Living Life on Life's Terms/Serenity Prayer; the second half of group was spent in process. Members were asked to explain what the "Living Life.." means to them. While some articulated its meaning quite succinctly, other members really didn't understand how this might relate to their recovery. This led into a discussion on the Serenity Prayer with members completing a handout asking them to identify what they can and cannot change. This session was intense and challenged members to examine their lives and perspective very seriously.    Summary: Sobriety Date = 5/4. The patient reported she had attended an [employer] meeting this morning. She announced she had also "phoned my sponsor". The group applauded this news since it was only the day before I group that the patient expressed hesitation to phone her for fear it would be inconvenient for the new sponsor. The group had urged her to be more assertive and not so reluctant or hesitant to ask for things that address her needs. From a very early age, this  70 yo woman has been taught to take care of everyone else and then maybe take care of her. But even this is doubtful. The patient laughed as she recounted that they had had a great talk and she had been so pleased. The patient also pointed out how the group session yesterday had been so powerful and she shared how being here in this program "has made such a difference in my life". During the psycho-ed, the patient identified not being able to change the fact that she is an alcoholic. she cannot change her past or change her daughter's mental illness. The patient reported she can change: her attitude, set boundaries and practice the grounding exercises she has learned here in group. She was very engaged and shared openly about her struggles and how she is learning and gaining strength and courage from this program. The patient responded well to this intervention and is making substantial progress in early recovery.    Family Program: Family present? No   Name of family member(s):   Drug test collected: No Results:   AA/NA attended?: Yes, Thursday  Sponsor?: Yes, she just secured one   Jakaiden Fill, LCAS

## 2015-10-21 NOTE — Telephone Encounter (Signed)
Reviewed results with patient . Lost connection will call her back

## 2015-10-21 NOTE — Telephone Encounter (Signed)
Please call patient: Her stool studies are negative for bacteria or parasite infections causing her diarrhea. If she is still having diarrhea we can do a trial of Lomotil for her, or she can wait to discuss with her GI next week. Please advise.

## 2015-10-22 ENCOUNTER — Telehealth: Payer: Self-pay | Admitting: Acute Care

## 2015-10-22 ENCOUNTER — Telehealth: Payer: Self-pay | Admitting: Family Medicine

## 2015-10-22 ENCOUNTER — Other Ambulatory Visit: Payer: Self-pay | Admitting: Acute Care

## 2015-10-22 ENCOUNTER — Other Ambulatory Visit (HOSPITAL_COMMUNITY): Payer: 59

## 2015-10-22 DIAGNOSIS — F17210 Nicotine dependence, cigarettes, uncomplicated: Secondary | ICD-10-CM

## 2015-10-22 DIAGNOSIS — I2510 Atherosclerotic heart disease of native coronary artery without angina pectoris: Secondary | ICD-10-CM

## 2015-10-22 HISTORY — DX: Atherosclerotic heart disease of native coronary artery without angina pectoris: I25.10

## 2015-10-22 LAB — HEPATITIS C ANTIBODY: HCV Ab: NEGATIVE

## 2015-10-22 NOTE — Telephone Encounter (Signed)
Please call pt: Her additional labs are normal/stable

## 2015-10-22 NOTE — Telephone Encounter (Signed)
Spoke with patient she will wait until she see her GI Dr.

## 2015-10-22 NOTE — Telephone Encounter (Signed)
I have called the results of the low dose CT scan to Kendra Ball . I explained that her scan was read as a Lung RADS 2: nodules that are benign in appearance and behavior with a very low likelihood of becoming a clinically active cancer due to size or lack of growth. Recommendation per radiology is for a repeat LDCT in 12 months.I told her we will schedule her annual scan for June of 2018. She verbalized understanding and had no further questions or concerns. She does have the office contact information in the event she has any questions in the future.

## 2015-10-22 NOTE — Telephone Encounter (Signed)
Patient aware all labs stable.

## 2015-10-23 ENCOUNTER — Encounter (HOSPITAL_COMMUNITY): Payer: Self-pay | Admitting: Psychology

## 2015-10-23 NOTE — Progress Notes (Signed)
    Daily Group Progress Note  Program: CD-IOP   Group Time: 1-2:30 pm  Participation Level: Active  Behavioral Response: Sharing  Type of Therapy: Process Group  Topic: Process: the first part of group was spent in process. Members shared about the past weekend and any challenges to their sobriety was discussed. A member returned after missing the last 3 group sessions on a long-ago scheduled vacation. She talked about her experiences. During group today, the Market researcher met with the member scheduled to graduate today. Five random drug tests were collected today.  Group Time: 2:45-4 pm  Participation Level: Active  Behavioral Response: Sharing  Type of Therapy: Psycho-ED  Topic: Psycho-Ed; Communication/Graduation: the second half of group was spent in a psycho-ed on 'Communication'. Members were provided a handout and asked to split up into dyads and sit with their backs to each other. Then one of the two people began to describe an image for the other person and he/she were asked to draw the image based on the verbal description. At the end of this session, members provided each other feedback about how they might have explained things more clearly. A discussion on communication and some of the most essential elements of communication identified by members. With 20 minutes remaining, the session was halted and a graduation ceremony observed. The patient's husband had come to visit and be present for his wife's graduation. Kind and encouraging words of hope and encouragement were shared with the graduating member and a few tears were shed by her.   Summary: Sobriety date = 5/4. The patient reported she had had a difficult weekend and explained, "I reverted back to my old ways". She described herself as being very grump and 'pissy' most of the weekend. The patient noted, "I gave my joy away". She attended a meeting and had met with her new sponsor. She stated that 'My joy is back'  having surrounded herself with people in recovery. The patient reported that being here and in her AA meetings is 'so important' to keep her reminded of living life differently and not following back into her old patterns. However, the patient was quick to point out that "at least IO see the difference now" and other members agreed that awareness is the first step. Although her partner in the exercise reported that she had been very patient, this group member insisted that she had not given him enough detail. The patient had some kind words to share with the graduating member and she made some good comments. The patient continues to make measurable progress in her recovery.  Kendra Ball   Family Program: Family present? No  Name of family member(s):   UDS collected: Yes Results: Negative  AA/NA attended?: Yes, Monday  Sponsor?: Yes   Theran Vandergrift, LCAS

## 2015-10-26 ENCOUNTER — Other Ambulatory Visit (HOSPITAL_COMMUNITY): Payer: Medicare Other

## 2015-10-28 ENCOUNTER — Encounter: Payer: Self-pay | Admitting: Gastroenterology

## 2015-10-28 ENCOUNTER — Other Ambulatory Visit (HOSPITAL_COMMUNITY): Payer: Self-pay | Admitting: Medical

## 2015-10-28 ENCOUNTER — Encounter (HOSPITAL_COMMUNITY): Payer: Self-pay | Admitting: Psychology

## 2015-10-28 ENCOUNTER — Other Ambulatory Visit (HOSPITAL_COMMUNITY): Payer: Medicare Other | Attending: Psychiatry | Admitting: Psychology

## 2015-10-28 DIAGNOSIS — F331 Major depressive disorder, recurrent, moderate: Secondary | ICD-10-CM

## 2015-10-28 DIAGNOSIS — F1020 Alcohol dependence, uncomplicated: Secondary | ICD-10-CM | POA: Insufficient documentation

## 2015-10-28 DIAGNOSIS — F13939 Sedative, hypnotic or anxiolytic use, unspecified with withdrawal, unspecified: Secondary | ICD-10-CM

## 2015-10-28 DIAGNOSIS — F411 Generalized anxiety disorder: Secondary | ICD-10-CM | POA: Insufficient documentation

## 2015-10-28 DIAGNOSIS — F13239 Sedative, hypnotic or anxiolytic dependence with withdrawal, unspecified: Secondary | ICD-10-CM

## 2015-10-28 DIAGNOSIS — Z9884 Bariatric surgery status: Secondary | ICD-10-CM

## 2015-10-28 NOTE — Progress Notes (Signed)
    Daily Group Progress Note  Program: CD-IOP   Group Time: 1-2:30  Participation Level: Active  Behavioral Response: Appropriate and Sharing  Type of Therapy: Process Group  Topic: Clinician facilitated check-in regarding current stressors and situation, and progress on recovery. Clinician utilized active listening, validation, and empathetic response.  Clinician introduced topic of communication, specifically the types of communication and refusal skills,  and led discussion and activity based on this topic. Clinician assessed for immediate needs and safety concerns.       Group Time: 2:45-4  Participation Level: Active  Behavioral Response: Appropriate and Sharing  Type of Therapy: Psycho-education Group  Topic: Clinician facilitated check-in regarding current stressors and situation, and progress on recovery. Clinician utilized active listening, validation, and empathetic response.  Clinician introduced topic of communication, specifically the types of communication and refusal skills,  and led discussion and activity based on this topic. Clinician assessed for immediate needs and safety concerns.     Summary: Patient reports having heightened stress and anxiety due to an upcoming trip and denies negative impact on her recovery. Patient reports understanding of topic and related it to personal experience, recognizing ways to improve, specifically with saying no. Patient denies SI/HI/self harm thoughts and immediate needs.    Family Program: Family present? No   Name of family member(s):   UDS collected: No Results: negative  AA/NA attended?: [place]  Sponsor?: No   Rogan Ecklund, LCAS

## 2015-10-29 ENCOUNTER — Other Ambulatory Visit (HOSPITAL_COMMUNITY): Payer: Medicare Other | Admitting: Psychology

## 2015-10-29 ENCOUNTER — Ambulatory Visit: Payer: Self-pay | Admitting: Gastroenterology

## 2015-10-29 DIAGNOSIS — F1020 Alcohol dependence, uncomplicated: Secondary | ICD-10-CM

## 2015-10-29 DIAGNOSIS — F331 Major depressive disorder, recurrent, moderate: Secondary | ICD-10-CM

## 2015-10-30 ENCOUNTER — Encounter (HOSPITAL_COMMUNITY): Payer: Self-pay | Admitting: Psychology

## 2015-10-30 LAB — PRESCRIPTION ABUSE MONITORING 17P, URINE
6-Acetylmorphine, Urine: NEGATIVE ng/mL
Amphetamine Scrn, Ur: NEGATIVE ng/mL
BARBITURATE SCREEN URINE: NEGATIVE ng/mL
BENZODIAZEPINE SCREEN, URINE: NEGATIVE ng/mL
Buprenorphine, Urine: NEGATIVE ng/mL
CANNABINOIDS UR QL SCN: NEGATIVE ng/mL
Carisoprodol/Meprobamate, Ur: NEGATIVE ng/mL
Cocaine (Metab) Scrn, Ur: NEGATIVE ng/mL
Creatinine(Crt), U: 19.6 mg/dL — ABNORMAL LOW (ref 20.0–300.0)
EDDP, Urine: NEGATIVE ng/mL
Fentanyl, Urine: NEGATIVE pg/mL
MDMA Screen, Urine: NEGATIVE ng/mL
Meperidine Screen, Urine: NEGATIVE ng/mL
Methadone Screen, Urine: NEGATIVE ng/mL
Nitrite Urine, Quantitative: NEGATIVE ug/mL
OXYCODONE+OXYMORPHONE UR QL SCN: NEGATIVE ng/mL
Opiate Scrn, Ur: NEGATIVE ng/mL
Ph of Urine: 5.3 (ref 4.5–8.9)
Phencyclidine Qn, Ur: NEGATIVE ng/mL
Propoxyphene Scrn, Ur: NEGATIVE ng/mL
SPECIFIC GRAVITY: 1.0044
Tapentadol, Urine: NEGATIVE ng/mL
Tramadol Screen, Urine: NEGATIVE ng/mL

## 2015-10-30 LAB — ETHYL GLUCURONIDE, URINE: Ethyl Glucuronide Screen, Ur: NEGATIVE ng/mL

## 2015-10-30 NOTE — Progress Notes (Signed)
Kendra Ball is a 70 y.o. female patient. CD-IOP: Treatment Plan Update. Counselor met with the patient this morning as scheduled. She meets with me on Friday mornings and then goes to her 10 am AA meeting. Today the patient expressed frustration that she has not been doing the things to feed her soul. She explained that over the past few days she has been taking care of everyone else and ignoring her needs. She ends up feeling grumpy and resentful and not good company.  I reminded the patient that she has been doing this other thing for many years and setting boundaries and following them. It will take time, practice and commitment to incorporate this new way of living into her daily life. I also reminded the patient that she doesn't have a lot of support for making these changes and, in fact, she might have resistance from others to the changes she is making and the changes that will be required of other family members. The patient agreed that there is some resistance, but also noted that she had not really explained to her family what she needs and they think now that she is not drinking, everything is fine. We discussed boundaries and I provided handouts on boundaries. The patient and I read through some of them together. Prior to the session ending, we reviewed her goals to date. It was noted she has done very well relative to sobriety and has not used since she entered [facility] back in early May. She has also found a home group and sponsor and is involved in the [facility]. She is building a strong network of support for her recovery. The patient continues to make good progress ion early recovery, but admittedly is struggling with the natural emotional ups and down as her mind seeks to restore balance within the chemical makeup of her brain. I reminded her that she has been drinking and drugging for years and it will take time before her natural dopamine levels reach the place where she will  experience less lability and more joy. The documentation was reviewed and signed and the treatment plan update completed accordingly. We will continue to follow closely in the days ahead. The patient's sobriety date remains 5/4.         Erique Kaser, LCAS

## 2015-11-02 ENCOUNTER — Other Ambulatory Visit (HOSPITAL_COMMUNITY): Payer: Medicare Other | Admitting: Psychology

## 2015-11-02 ENCOUNTER — Encounter (HOSPITAL_COMMUNITY): Payer: Self-pay | Admitting: Psychology

## 2015-11-02 DIAGNOSIS — F331 Major depressive disorder, recurrent, moderate: Secondary | ICD-10-CM

## 2015-11-02 DIAGNOSIS — F1020 Alcohol dependence, uncomplicated: Secondary | ICD-10-CM
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
## 2015-11-03 ENCOUNTER — Encounter (HOSPITAL_COMMUNITY): Payer: Self-pay | Admitting: Psychology

## 2015-11-03 NOTE — Progress Notes (Signed)
Daily Group Progress Note  Program: CD-IOP   Group Time: 1-2:30  Participation Level: Active  Behavioral Response: Appropriate and Sharing  Type of Therapy: Process Group  Topic: Counselors met with patients for group processing session which focused on patient experience in recovery. Patients were active and engaged in session. Drug tests were collected from multiple patients. Patients were active and engaged in session, discussing their challenges, thoughts, and feelings concerning sobriety.     Group Time: 2:45-4  Participation Level: Active  Behavioral Response: Appropriate and Sharing  Type of Therapy: Psycho-education Group  Topic: Counselors met with patients for group psychoeducation session which utilized a CBT intervention entitled "The ABCD" chart. Patients identified their negative behavioral and emotional consequences to their negative thoughts. Counselor encouraged patients to use real-life examples and dispute their irrational beliefs. Patients responded well to intervention, but it was clear that more work was needed to further the patients' understanding of the topic.   Summary: Patient presented for group counseling today and was active, engaged, distraught, and tearful during group. Patient became emotionally dysregulated when discussing the fear she felt over the weekend. Patient stated she felt scared since she realized she could relapse and return to a "dark place" emotionally and physically. Counselor validated pt experience and helped her to identify the fear she felt about losing her recovery. Patient reported that she felt like she was "losing her mind", "shaky inside", and crying heavily over the weekend. Patient reported that she is beginning to take Campral again since she thought she might have experienced cravings over the weekend. Patient stated that she reached out to her sponsor to cope with stressors and began working the 1 step in AA. Patient  stated she "knew there was bourbon in her house, but was too tired to find it." Patient's report showed a heightened emotional awareness that could possibly have been numbed by the years of drug and alcohol use. Counselor encouraged patient to "feel her way through" her emotions and allow them pass while incorporating skills to stay sober. Patient did not share significantly during psychoeducation time but seemed observant and interested. Patient sobriety date is 5/4. Wes Swan, Counselor   Family Program: Family present? No   Name of family member(s):   UDS collected: No Results: negative  AA/NA attended?: YesFriday and Saturday  Sponsor?: Yes   ,, LCAS        

## 2015-11-04 ENCOUNTER — Other Ambulatory Visit (HOSPITAL_COMMUNITY): Payer: Medicare Other | Admitting: Psychology

## 2015-11-04 ENCOUNTER — Other Ambulatory Visit (HOSPITAL_COMMUNITY): Payer: Self-pay | Admitting: Medical

## 2015-11-04 ENCOUNTER — Encounter (HOSPITAL_COMMUNITY): Payer: Self-pay | Admitting: Psychology

## 2015-11-04 DIAGNOSIS — F1020 Alcohol dependence, uncomplicated: Secondary | ICD-10-CM | POA: Diagnosis not present

## 2015-11-04 DIAGNOSIS — F13939 Sedative, hypnotic or anxiolytic use, unspecified with withdrawal, unspecified: Secondary | ICD-10-CM

## 2015-11-04 DIAGNOSIS — F331 Major depressive disorder, recurrent, moderate: Secondary | ICD-10-CM

## 2015-11-04 DIAGNOSIS — F13239 Sedative, hypnotic or anxiolytic dependence with withdrawal, unspecified: Secondary | ICD-10-CM

## 2015-11-04 NOTE — Progress Notes (Signed)
    Daily Group Progress Note  Program: CD-IOP   Group Time: 1-2:30 pm  Participation Level: Active  Behavioral Response: Appropriate and Sharing  Type of Therapy: Process Group  Topic: Process: the first half of group was spent in process. Members shared about the holiday and what they had done to support their sobriety. All members checked-in with the same sobriety dates. Random drug tests were collected from all present today. Also present were 2 PA students from [facility]. The program director met with 2 group members during the session today.   Group Time: 2:45- 4pm  Participation Level: Active  Behavioral Response: Appropriate  Type of Therapy: Psycho-education Group  Topic: Psycho-ed/Graduation: the second half of group was spent in a psycho-ed on 'Anger'. A handout was provided and members took turns reading it. A discussion followed on differentiating "Destructive" versus "Constructive" Anger. As the session neared the end, a graduation ceremony was held for a member who was successfully completed the program. Words that included admiration, inspiration and courage were used in describing this man who has been a respected member of the group since he first arrived. The member's wife arrived and was also present and participated in the graduation ceremony.   Summary: Sobriety date = 5/4. The patient returned from the holiday trip to [place] that her family takes every 4th of July. She admitted she had 'missed group' and is aware of how much she needs the support and connection that her fellow group members provide her. She became teary and noted how she had gotten very frustrated with her family [place] and experienced anger towards them. The patient had attended one AA meeting over the holiday. She reported she felt very upset yesterday when they got home and her husband went to his den and drank while she settled into their home. The patient  reported she had walked to the liquor cabinet and looked in to see the whiskey bottle there. She admitted she contemplated for about 5 minutes whether she would have a drink, "which I knew would calm me down". The patient admitted she challenged herself and closed the cabinet door and went to her bedroom. She recognizes these are all new feelings in early recovery, but they are very difficult and uncomfortable and she finds herself wanting to numb them. During the psycho-ed, the patient was active in the discussion on anger. She admitted, "I don't like confrontation", as a result often stuffs her anger and then feels bitter. While she is learning to express herself very well here in group where others understand very well her struggles in early recovery, her family members do not. This patient will continue to work on setting boundaries around getting her own needs met. She has never done this in her 68+ years and she has repeatedly articulated her desire to change her life and take control of it in a way she has never known. The patient applauded the graduating member and agreed with another that he had been the first one to welcome her when she first came to the program. The patient responded well to this intervention, but learning to express her anger in a constructive manner remains essential for her.    Family Program: Family present? No   Name of family member(s):   UDS collected: Yes Results: negative  AA/NA attended?: YesSaturday  Sponsor?: Yes   Ryver Poblete, LCAS

## 2015-11-05 ENCOUNTER — Other Ambulatory Visit (HOSPITAL_COMMUNITY): Payer: Self-pay | Admitting: Medical

## 2015-11-05 ENCOUNTER — Other Ambulatory Visit (HOSPITAL_COMMUNITY): Payer: Medicare Other | Admitting: Psychology

## 2015-11-05 ENCOUNTER — Encounter (HOSPITAL_COMMUNITY): Payer: Self-pay | Admitting: Psychology

## 2015-11-05 DIAGNOSIS — F331 Major depressive disorder, recurrent, moderate: Secondary | ICD-10-CM

## 2015-11-05 DIAGNOSIS — F1020 Alcohol dependence, uncomplicated: Secondary | ICD-10-CM | POA: Diagnosis not present

## 2015-11-05 DIAGNOSIS — F411 Generalized anxiety disorder: Secondary | ICD-10-CM

## 2015-11-05 DIAGNOSIS — R634 Abnormal weight loss: Secondary | ICD-10-CM

## 2015-11-05 DIAGNOSIS — K529 Noninfective gastroenteritis and colitis, unspecified: Secondary | ICD-10-CM

## 2015-11-05 DIAGNOSIS — E039 Hypothyroidism, unspecified: Secondary | ICD-10-CM

## 2015-11-05 DIAGNOSIS — Z9049 Acquired absence of other specified parts of digestive tract: Secondary | ICD-10-CM

## 2015-11-05 MED ORDER — BUPROPION HCL 100 MG PO TABS: 100.0000 mg | ORAL_TABLET | Freq: Two times a day (BID) | ORAL | Status: DC

## 2015-11-05 MED ORDER — VENLAFAXINE HCL ER 75 MG PO CP24: 225.0000 mg | ORAL_CAPSULE | Freq: Every day | ORAL | Status: DC

## 2015-11-05 NOTE — Progress Notes (Signed)
    Daily Group Progress Note  Program: CD-IOP   Group Time: 1-2:30  Participation Level: Active  Behavioral Response: Appropriate and Sharing  Type of Therapy: Process Group  Topic: Counselors met with patients for group process session. Patients shared about their recovery from drugs and alcohol. Patients were active and engaged in group. 2 Patients graduated from group successfully with some family members and AA sponsors in attendance. One group member shared that he had not gone to any meetings in over a week and some group members confronted him about this issue.     Group Time: 2:45-4  Participation Level: Active  Behavioral Response: Appropriate and Sharing  Type of Therapy: Psycho-education Group  Topic: Counselors met with patients for group psychoeducation session. The topic was mindfulness in recovery with an emphasis on developing a non-judgmental stance and ability to be more present with their emotional experience. Counselor used a handout from the DBT skills workbook on developing "wise mind" in recovery. Patients were thoughtfully engaged and showed interest in the subject. Some patients felt overwhelmed by the "depth" of the subject but counselor encouraged them to practice mindfulness in simple terms.    Summary: Patient was engaged in group today and shared openly about her feelings from the past day. She stated that she felt proud that she asked her family to go out to eat this weekend instead of being forced to cook for everyone. Patient stated that she enjoyed yesterday's session on the ABC's of CBT and that she is beginning to incorporate it into her daily thinking. Patient felt that the topic of mindfulness was "very deep" and struggled to see how she could apply it to her life. Patient encouraged her to practice simply being present to whatever she is feeling or sensing in the moment. Patient acknowledged that mindfulness is a challenge for her but she express  an interest in developing an ability to enjoy things more fully. Patient responded well to intervention and was able to help another group member see the importance of attending AA even when you do not want to. Sobriety date is 5/4. Youlanda Roys, Counselor  Family Program: Family present? No   Name of family member(s):   UDS collected: No Results: negative  AA/NA attended?: No  Sponsor?: Yes   Eran Mistry, LCAS

## 2015-11-06 ENCOUNTER — Encounter (HOSPITAL_COMMUNITY): Payer: Self-pay | Admitting: Psychology

## 2015-11-06 NOTE — Progress Notes (Signed)
Kendra Ball is a 70 y.o. female patient. CD-IOP: Individual Counseling Session. I met with the patient this morning as scheduled. We have been meeting early Friday morning and then she heads to the 10 am AA meeting at the [facility], her home group. I applauded her engagement in the group session yesterday and her efforts to really understand and absorb the changes we are advocating. The patient had noted that it is one thing to know this stuff, but to live it is quite another story. Today we talked about her recent angst and worry she is falling back to her old care-taking behavior patterns. I pointed out that she has really no support for this new lifestyle of recovery except from our program, AA and her sisters, all of whom [place]. She agreed and described her husband and his negative outlook. All he does is sit in his den, watch TV, lay on the couch and sleep. He had been treated at one time for depression and had a counselor, but discontinued this and has repeatedly stated "I am fine". I encouraged this patient to attend an Al-Anon meeting and gave her the dates and times. I also suggested she ask her husband to go with her. I provided the patient with a handout of one of Melody Beattie's entries in "The Language of Letting Go" and we read it together. The patient smiled and shook her head as it described the importance of getting her needs and wants met and not subordinating them in favor of caretaking others. The session went very well and the patient was able to articulate the meaning and purpose of this session and what it challenged her to focus on. The patient is very accepting of the importance of self-care and setting boundaries, however, she has a daughter who struggles with mental illness and the patient is very concerned about her 3 grandchildren. She admitted that determining where that line is for her grandchildren is much more difficult than setting boundaries with her 73  yo daughter. The session concluded and I walked the patient up to the parking lot and her car. This woman has done well in early recovery and remained alcohol and drug-free. But here are many obstacles and pressures on her that will push her buttons and invite her to return to self-negation and caretaking. Her husband's alcoholic drinking is also a challenge and she is constantly aware that there is alcohol in the house. We will continue to work closely with this 70 yo patient to empower her to use of the many tools she has attained in this program and continue practicing them after she has left here. She responded well to this intervention and her sobriety date remains 5/4.       Jamerion Cabello, LCAS

## 2015-11-07 LAB — PRESCRIPTION ABUSE MONITORING 17P, URINE
6-Acetylmorphine, Urine: NEGATIVE ng/mL
Amphetamine Scrn, Ur: NEGATIVE ng/mL
BARBITURATE SCREEN URINE: NEGATIVE ng/mL
BENZODIAZEPINE SCREEN, URINE: NEGATIVE ng/mL
Buprenorphine, Urine: NEGATIVE ng/mL
CANNABINOIDS UR QL SCN: NEGATIVE ng/mL
Carisoprodol/Meprobamate, Ur: NEGATIVE ng/mL
Cocaine (Metab) Scrn, Ur: NEGATIVE ng/mL
Creatinine(Crt), U: 28.9 mg/dL (ref 20.0–300.0)
EDDP, Urine: NEGATIVE ng/mL
Fentanyl, Urine: NEGATIVE pg/mL
MDMA Screen, Urine: NEGATIVE ng/mL
Meperidine Screen, Urine: NEGATIVE ng/mL
Methadone Screen, Urine: NEGATIVE ng/mL
Nitrite Urine, Quantitative: NEGATIVE ug/mL
OXYCODONE+OXYMORPHONE UR QL SCN: NEGATIVE ng/mL
Opiate Scrn, Ur: NEGATIVE ng/mL
Ph of Urine: 5.5 (ref 4.5–8.9)
Phencyclidine Qn, Ur: NEGATIVE ng/mL
Propoxyphene Scrn, Ur: NEGATIVE ng/mL
SPECIFIC GRAVITY: 1.01
Tapentadol, Urine: NEGATIVE ng/mL
Tramadol Screen, Urine: NEGATIVE ng/mL

## 2015-11-07 LAB — ETHYL GLUCURONIDE, URINE: Ethyl Glucuronide Screen, Ur: NEGATIVE ng/mL

## 2015-11-09 ENCOUNTER — Other Ambulatory Visit (HOSPITAL_COMMUNITY): Payer: Medicare Other | Admitting: Psychology

## 2015-11-09 DIAGNOSIS — F331 Major depressive disorder, recurrent, moderate: Secondary | ICD-10-CM

## 2015-11-09 DIAGNOSIS — F1020 Alcohol dependence, uncomplicated: Secondary | ICD-10-CM

## 2015-11-10 ENCOUNTER — Encounter (HOSPITAL_COMMUNITY): Payer: Self-pay | Admitting: Psychology

## 2015-11-10 NOTE — Progress Notes (Signed)
    Daily Group Progress Note  Program: CD-IOP   Group Time: 1-2:30  Participation Level: Active  Behavioral Response: Appropriate and Sharing  Type of Therapy: Process Group  Topic: Patients presented for group process session. Topics included patients' sobriety, recovery efforts, and any challenges they faced over the weekend. Counselors used open questions and MI to evoke "change talk", grow motivation, and help patients explore their affective experience. Drugs tests were collected from some individuals. One patient met with Darlyne Russian, PA, program director to establish care.     Group Time: 2:45-4  Participation Level: Active  Behavioral Response: Appropriate and Sharing  Type of Therapy: Psycho-education Group  Topic: Patients presented for group psychoeducation session. Topics included "body, mind, spirit connection" with an emphasis on spirituality in recovery. Patients explored their spiritual connection including soul, religious experience, and purpose with a recovery-based discussion. Patients were active and engaged.   Summary: Patient presented for group session today and was active and engaged in group. She reported that she attended 2 AA meetings over the weekend and called her sponsor when she felt anxious. She spent time caring for her grandchildren but also found time to set boundaries for herself with her family by requesting to go out to eat instead of cooking by herself. Patient shared that she likes to engage her spirituality and faith to "quiet her mind" since she becomes anxious periodically. Patient shared that she is enjoying tx and learning a great deal at both AA and CD-IOP. She reports that she is working on balance between her needs and taking care of others. Patient seems to be doing well in recovery and is nearing the end of her tx. Counselor gave patient her negative drug test results from last week. Patient sobriety date is 5/4.   Family Program:  Family present? No   Name of family member(s):   UDS collected: No Results: negative  AA/NA attended?: YesSaturday and Sunday  Sponsor?: Yes   Kendra Ball, LCAS

## 2015-11-10 NOTE — Progress Notes (Signed)
    Daily Group Progress Note  Program: CD-IOP   Group Time: 1-2:30 pm  Participation Level: Active  Behavioral Response: Sharing  Type of Therapy: Process Group  Topic: Process:  The first half of group was spent in process. Members shared about any obstacles or challenges in early recovery.  One member checked-in with a new sobriety date of today. His relapse was analyzed at length.  A new group member was present and during this part of group, she shared about herself and what she needed from this program and the group. The program director met with 2 group members today. Drug tests were collected from 2 group members.  Group Time: 2:45- 4pm  Participation Level: Active  Behavioral Response: Appropriate and Sharing  Type of Therapy: Psycho-education Group  Topic: Psycho-Ed: The second half of group was spent in a psycho-ed. the session was the conclusion of the previous group session and the topic was CBT or, specifically, the ABC(D) technique of identifying and challenging negative self-talk and beliefs. The handout provided in Monday's group session was reviewed and specific examples that group members identified were analyzed on the board. This psycho-ed included challenging the old inaccurate beliefs and replacing them with objective and accurate beliefs. One member explained that he replaces the "B with the D". The importance of challenging our thoughts and beliefs was reiterated throughout this session.   Summary: Sobriety Date = 5/4. The patient reported she had attended one meeting since the holiday. She had returned from [place] late yesterday with her husband. She had enjoyed her time with her sisters and family members, but was excited to be back with her support group and AA meetings. The patient reported she had read about character defects in the Big Book, but she had really figured out that she didn't have many of those now that she is sober. 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 The patient admitted she hadn't really seen them until they were explained in detail during the meeting. The patient reported that she has been 'people pleasing' as long as she can remember. In the psycho-ed, the patient shared that last week she had felt as if "I was losing my mind" and that I was falling back into old patterns and habits. Today, she is willing to challenge those old beliefs and she reported "I am not losing my mind. I am doing well and I have 60 days of sobriety". The patient reminded the group that she has been drinking and taking Xanax for years. She described her condition as 'flat lining' and reported she is now experiencing the emotional ups and downs of early recovery and it is 'overwhelming'. The patient received good feedback from her fellow group members and agreed she will have to practice 'challenging' her beliefs because the negative ones come pretty naturally. She responded well to this intervention and has achieved over 60 days of sobriety.   Family Program: Family present? No   Name of family member(s):   UDS collected: No   AA/NA attended?: NoWednesday  Sponsor?: Yes   ,, LCAS        

## 2015-11-11 ENCOUNTER — Other Ambulatory Visit (HOSPITAL_COMMUNITY): Payer: Medicare Other | Admitting: Psychology

## 2015-11-11 DIAGNOSIS — F13239 Sedative, hypnotic or anxiolytic dependence with withdrawal, unspecified: Secondary | ICD-10-CM

## 2015-11-11 DIAGNOSIS — Z9884 Bariatric surgery status: Secondary | ICD-10-CM

## 2015-11-11 DIAGNOSIS — F1020 Alcohol dependence, uncomplicated: Secondary | ICD-10-CM

## 2015-11-11 DIAGNOSIS — F13939 Sedative, hypnotic or anxiolytic use, unspecified with withdrawal, unspecified: Secondary | ICD-10-CM

## 2015-11-11 DIAGNOSIS — F331 Major depressive disorder, recurrent, moderate: Secondary | ICD-10-CM

## 2015-11-12 ENCOUNTER — Other Ambulatory Visit (HOSPITAL_COMMUNITY): Payer: Medicare Other | Admitting: Psychology

## 2015-11-12 ENCOUNTER — Encounter (HOSPITAL_COMMUNITY): Payer: Self-pay | Admitting: Psychology

## 2015-11-12 DIAGNOSIS — F331 Major depressive disorder, recurrent, moderate: Secondary | ICD-10-CM

## 2015-11-12 DIAGNOSIS — F1020 Alcohol dependence, uncomplicated: Secondary | ICD-10-CM

## 2015-11-12 NOTE — Progress Notes (Signed)
Daily Group Progress Note  Program: CD-IOP   Group Time: 1-2:30 pm  Participation Level: Active  Behavioral Response: Appropriate and Sharing  Type of Therapy: Process Group  Topic: Process: the first half of group was spent in process. Members shared about challenges and successes in early recovery. Included in their check-in, are the things that they did since we last met that enhance or support their sobriety. The program director met with the 2 newest members. Two random drug tests were collected today while the 5 collected on Monday were returned.  Group Time: 2-45-4 pm  Participation Level: Active  Behavioral Response: Appropriate  Type of Therapy: Psycho-education Group  Topic: Psycho-ed: Mind, Body, Spirit, Part 2. The second half of group was spent in a psycho-ed. The session included members completing the handout provided on Monday. The discussion that followed focused on identifying how 'feed' their mind. The session focused mostly in values and the question was 'how do you value yourself'? this proved challenging to a group of caretakers who have difficulty sitting still, let alone considering their worth outside of what they do. The session proved challenging with group members agreeing that this issue will have to be addressed in their recovery.   Summary: Sobriety Date = 5/4. The patient reported she had gone to a meeting today and then had lunch with another group member. It turns out they only live about 5 minutes from each other so they have agreed to spend more time together going forward. The patient reported she is concerned about when she graduates from this program and how she must have structure and things to do. If she doesn't have plans for the day, she described her thoughts as getting very "tangled". She is afraid she might go back and began drinking and that is the last thing she wants. "I need accountability, I need assignments", the patient explained.  Another member encouraged her to attend some different AA meetings (and offered some women's meetings) and she agreed that she would like to mix up her schedule a little and find some 'new meetings'. I pointed out that she has reported instances where she sits quietly out on the patio with her dog on her lap. In those times she has reported being very peaceful and not really thinking about anything, but just experiencing. The patient smiled and understood that, yes, she can be quiet and reflective and don't always have to suffer with 'monkey mind' all the time.  In the psycho-ed, the patient reported these things all made perfect sense, but it's hard to put them into action. She reminded the group that now that she is not drinking, her family thinks she is 'good to go'. They don't understand that there were and remain deeper issues that led to her drinking. She still has much work to do and she wants to make sure they understand this. I wondered if perhaps her husband would come in with her for her individual counseling session on Friday morning. She agreed that she would ask him to join [place]. The patient is a valued group member and continues to use her own experiences in providing feedback to her fellow group members. The patient remains drug-free and has over 60 days of sobriety. She will continue to reach out to other women in recovery and gain the support she will need as she works the steps and addresses her character defects. The patient responded well to this intervention.   Family Program: Family present? No  Name of family member(s):   UDS collected: No Results:   AA/NA attended?: YesWednesday  Sponsor?: Yes   Kendra Ball, LCAS

## 2015-11-15 NOTE — Progress Notes (Signed)
    Daily Group Progress Note  Program: CD-IOP   Group Time: 1-2:30 pm  Participation Level: Active  Behavioral Response: Appropriate and Sharing  Type of Therapy: Process Group  Topic: Process: the first half of group was spent in process. Members identified everything they had done to support their recovery since we met yesterday. There was good exchange and feedback among members. One member struggled with identifying his feelings so the "Feeling Wheel" was passed around with members identifying their feelings. This counselor reminded the group that she and her co-therapist are doing everything they can to help each group member in developing strategies and skills in order that they will keep their addictions in remission. The question I asked was, "are you, in turn, doing everything you can to learn how to manage your disease"?  Group Time: 2:45- 4pm  Participation Level: Active  Behavioral Response: Appropriate  Type of Therapy: Psycho-education Group  Topic: Psycho-Ed: "Self-Care". The second half of group was spent in a psycho-ed. The topic was self-care and the importance of a balanced life in early recovery was emphasized. Critical issues like diet, sleep and exercise were discussed and some healthy parameters provided in each instance. As the session neared an end, members shared about their plans for the upcoming weekend. One member reminded his fellow group members that "If you don't have a plan, you have got a plan to drink".   Summary: Sobriety date = 5/4. The patient reported she had fully intended to go to a meeting this morning, but she couldn't get to sleep last night and slept in this morning. The patient reported she feels like 'a kid at Christmas time' and explained she is very excited about her new life in recovery and all the things she can do now that she is fully awake and alive. She described herself as being 'mentally pumped up' and had spoken with her sponsor  about this. In the psycho-ed, the patient reported she tries to eat in a healthy manner and, except for last night, she is sleeping well. Finding balance in her life and insuring that she takes time for herself is one of the biggest challenges this woman faces as she prepares to leave this program. She has voiced her intention to develop a routine to insure she stays focused and grounded. She I s going to attend a different women's meeting tomorrow and mix things up a bit. The patient provided helpful feedback to her fellow group members and displays a growing insight into her own existential needs and how those can impact her sobriety. The patient responded well to this intervention.    Family Program: Family present? No   Name of family member(s):   UDS collected: No Results:   AA/NA attended?: No  Sponsor?: Yes   Vanetta Rule, LCAS

## 2015-11-16 ENCOUNTER — Other Ambulatory Visit (HOSPITAL_COMMUNITY): Payer: Medicare Other | Admitting: Psychology

## 2015-11-16 ENCOUNTER — Encounter (HOSPITAL_COMMUNITY): Payer: Self-pay | Admitting: Psychology

## 2015-11-16 ENCOUNTER — Encounter (HOSPITAL_COMMUNITY): Payer: Self-pay | Admitting: Medical

## 2015-11-16 DIAGNOSIS — F17210 Nicotine dependence, cigarettes, uncomplicated: Secondary | ICD-10-CM

## 2015-11-16 DIAGNOSIS — F411 Generalized anxiety disorder: Secondary | ICD-10-CM

## 2015-11-16 DIAGNOSIS — Z811 Family history of alcohol abuse and dependence: Secondary | ICD-10-CM

## 2015-11-16 DIAGNOSIS — G4700 Insomnia, unspecified: Secondary | ICD-10-CM

## 2015-11-16 DIAGNOSIS — Z9049 Acquired absence of other specified parts of digestive tract: Secondary | ICD-10-CM

## 2015-11-16 DIAGNOSIS — F1020 Alcohol dependence, uncomplicated: Secondary | ICD-10-CM

## 2015-11-16 DIAGNOSIS — F331 Major depressive disorder, recurrent, moderate: Secondary | ICD-10-CM

## 2015-11-16 DIAGNOSIS — E039 Hypothyroidism, unspecified: Secondary | ICD-10-CM

## 2015-11-16 DIAGNOSIS — K529 Noninfective gastroenteritis and colitis, unspecified: Secondary | ICD-10-CM

## 2015-11-16 DIAGNOSIS — R634 Abnormal weight loss: Secondary | ICD-10-CM

## 2015-11-16 DIAGNOSIS — Z9884 Bariatric surgery status: Secondary | ICD-10-CM

## 2015-11-16 MED ORDER — BUPROPION HCL 100 MG PO TABS: 100.0000 mg | ORAL_TABLET | Freq: Two times a day (BID) | ORAL | 0 refills | Status: DC

## 2015-11-16 MED ORDER — TRAZODONE HCL 150 MG PO TABS: 150.0000 mg | ORAL_TABLET | Freq: Every day | ORAL | 2 refills | Status: DC

## 2015-11-16 MED ORDER — VENLAFAXINE HCL ER 75 MG PO CP24: 75.0000 mg | ORAL_CAPSULE | Freq: Every day | ORAL | 0 refills | Status: DC

## 2015-11-16 MED ORDER — BUSPIRONE HCL 10 MG PO TABS: 10.0000 mg | ORAL_TABLET | Freq: Three times a day (TID) | ORAL | 2 refills | Status: DC

## 2015-11-16 NOTE — Progress Notes (Signed)
  [facility] Outpatient Discharge Summary   INAAYA SOUTHWELL [id number]  Date of Admission: 09/10/2015 Date of Discharge: 11/16/2015  Course of Treatment: Pt was oriented to [facility] 09/10/2015 and entered therapy 09/14/2015.Prior to her Admission to CD IOP she was being seen here by Dr Arnoldo Lenis MD and Francee Piccolo LCSW for MDD and GAD. On 08/26/2015 she came to the ED requesting help with her drinking and associated SI. She was subsequently admitted to [facility] and on discharge was referred for CD IOP treatment. Pt has been observant of abstinence and fulfilled every requirement of her CD IOP treatment with positive attitude and energy..  She responded nicely to Buspar and Wellbutrin  for her anxiety and mood . Goals and Activities to Help Maintain Sobriety: 1. Stay away from old friends who continue to drink and use mind-altering chemicals. 2. Continue practicing Fair Fighting rules in interpersonal conflicts. 3. Continue alcohol and drug refusal skills and call on support systems. 4. Resume care with Dr Arnoldo Lenis and Counselor Florene Glen  Referrals: back to Dr Arnoldo Lenis and Francee Piccolo LCSW   Aftercare services: CDIOP Aftercare Tuesdays 5pm 1. Attend AA at lease 4 times per week for next 6 months 2. Continue with a sponsor and a home group in [place] 3. Return to Psychotherapist:as scheduled  Next appointment: Aftercare;Dr Argawal 01/28/16 10 am  Plan of Action to Address Continuing Problems:Above    Client has participated in the development of this discharge plan and has received a copy of this completed plan  Darlyne Russian  11/16/2015   Darlyne Russian, PA-C 11/16/2015

## 2015-11-17 NOTE — Progress Notes (Signed)
  Daily Group Progress Note  Program: CD-IOP   Group Time: 1-2: 30 pm  Participation Level: Active  Behavioral Response: Sharing  Type of Therapy: Process Group  Topic: Process: the first half of group was spent in process. Members shared about the past weekend and the things that they had done to support their sobriety as well as any challenges that presented themselves.  One member arrived a few minutes late and appeared out of sorts. He checked-in with a sobriety date of today. He was asked about this and he reported one of 'my homeboys' is in the hospital after suffering a stroke. He is in ICU and it doesn't look good. He reported he needed to be there with his friend and couldn't stay. He got up and left. Drug tests were collected from all group members today and the medical director met with the graduating member.   Group Time: 2:45-4 pm  Participation Level: Active  Behavioral Response: Approriate, sharing  Type of Therapy: Psycho-Ed: Topic: Bio-Psycho-Social-Spiritual/Graduation. The second half of group was spent in a psycho-ed on the 4 different elements that lead to addiction. These different elements were discussed in detail. It was noted that the only one that is genetically-based was the biological element. The characteristics that might be displayed based were identified. Members followed up by identifying the ways one in recovery could contribute to strengthening these elements.  As the session neared an end, a graduation ceremony was held honoring a graduating member. Her husband was brought in to sit with her during the ceremony. Kind words of hope, admiration and courage were shared. The patient shared her hopes for her fellow group members and thanked everyone for helping her along the way.   Summary: Sobriety date = 5/4. The patient reported she had attended a women's AA meeting Friday evening. She had enjoyed it, but had not found it as comfortable as her homegroup.   The patient shared that she had met with his counselor and her husband on Friday morning and she had been very pleased with his response. He seems to understand that "it's a lifetime journey" and support her in this challenge. The patient reported she had spent a few hours with her sponsor reviewing Step 1 on Saturday and the entertained her daughter's family for dinner on Sunday evening. She had found herself struggling with some stomach issues this morning and did not attend the morning meetings as she had intended. The patient admitted she has been stressed out about leaving the program, but she noted that she must practice what she has learned. During the psycho-ed, the patient met briefly with the program director to complete her discharge plan. Upon her return, she confirmed the parental responsibilities she was required to assume at a very early age, in part, because of her father's sudden death. The graduation ceremony was held and this patient had brought her husband in to the group room from the lobby. She was teary as group members shared their thoughts with her. She thanked everyone present and how important all of them were to her and the things they had taught her. She encouraged them to stay strong and do the daily work that this lifestyle requires. The patient leaves here with all the tools and strategies she will need to remain abstinent and keep her disease in remission. It remains to be seen whether she will enforce the boundaries she has learned and insure she focuses on her own self-care before compromising her own   well-being for others. She responded well to this intervention.     Family Program: Family present: Yes  Name of family member(s): Abe People her husband  UDS collected: NO Results:  AA/NA attended: yes, Friday evening  Sponsor: Yes   Brandon Melnick, LCAS

## 2015-11-18 ENCOUNTER — Other Ambulatory Visit (HOSPITAL_COMMUNITY): Payer: Medicare Other

## 2015-11-19 ENCOUNTER — Other Ambulatory Visit (HOSPITAL_COMMUNITY): Payer: Medicare Other

## 2015-11-23 ENCOUNTER — Other Ambulatory Visit (HOSPITAL_COMMUNITY): Payer: Medicare Other

## 2015-11-24 ENCOUNTER — Other Ambulatory Visit: Payer: Self-pay | Admitting: *Deleted

## 2015-11-24 MED ORDER — ALENDRONATE SODIUM 70 MG PO TABS: 70.0000 mg | ORAL_TABLET | ORAL | 5 refills | Status: DC

## 2015-11-24 NOTE — Telephone Encounter (Signed)
Alendronate refilled.

## 2015-11-25 ENCOUNTER — Other Ambulatory Visit (HOSPITAL_COMMUNITY): Payer: 59

## 2015-11-26 ENCOUNTER — Other Ambulatory Visit (HOSPITAL_COMMUNITY): Payer: 59

## 2015-11-30 ENCOUNTER — Other Ambulatory Visit (HOSPITAL_COMMUNITY): Payer: 59

## 2015-12-01 NOTE — Progress Notes (Signed)
Kendra Ball is a 70 y.o. female patient. CD-IOP: Successful Completion of the CD-IOP. ADMIT DATE: 09/14/2015  DISCHARGE DATE: 11/16/2015  Reason for Admission: Patient sought intensive outpatient treatment to address her alcohol and drug addiction upon discharge from [facility].   Chemical Use History: Patient had used in her younger years, but had refrained from alcohol use for many years. In the last 8-10 months, the patient began drinking increasing quantities of alcohol. She was also prescribed benzodiazepines for anxiety and the combination left her increasingly isolated, sleeping and despondent. Her husband and 2 adult daughters held a 'mini-intervention' of sorts and she was taken to [place] ED. She was later transferred to [facility] where she was successfully detoxed from the alcohol and benzodiazepines. She sought further treatment to address her condition.  Family of Origin Issues: Patient is the oldest of 5 children. At the age of 48, her father died of a massive heart attack. He had struggled with heart issues for years. The patient's mother and 5 children moved back to [place], from the state of [place], where her mother's extended family lived. She grew up in a loving supportive family. The patient enjoys close relationships with her siblings and she and her family spend summers in [place] a campground where other family members live.   Progress in Program toward Treatment Goals. The patient has made significant progress towards her goals of treatment. She has remained alcohol and drug-free since 08/27/15, has secured a sponsor and identified an [place] home group. The patient has begun to establish new boundaries that allow for getting her own needs met and is seeking support outside the home from other women in recovery. She is practicing assertiveness skills and addressing her tendency to take on everything her daughter or grandchildren need or want. She will continue to work  on her self-esteem and will return for individual counseling with Francee Piccolo, her at [facility] in the outpatient department.  Prognosis (rationale): The prognosis for this patient is fairly good. She recently picked up her 90-day chip and is working closely with her sponsor on Step Work. She is stable on her psychiatric medications and will return in October for medication management with Dr. Doyne Keel here at [facility]. The patient will continue to work on refusal skills around taking on responsibilities that can and should be assumed by her daughter and grandchildren. This woman has been taking care of others for most of her life and is only now recognizing how neglecting her own needs and wants has come with a price. She is determined to savor every moment and insure that she actively engaged in practices that will enhance her recovery, strengthen her spiritual self and bring serenity in her daily life.           Brandon Melnick, LCAS

## 2015-12-02 ENCOUNTER — Other Ambulatory Visit (HOSPITAL_COMMUNITY): Payer: 59

## 2015-12-03 ENCOUNTER — Other Ambulatory Visit (HOSPITAL_COMMUNITY): Payer: 59

## 2015-12-07 ENCOUNTER — Other Ambulatory Visit (HOSPITAL_COMMUNITY): Payer: 59

## 2015-12-09 ENCOUNTER — Other Ambulatory Visit (HOSPITAL_COMMUNITY): Payer: 59

## 2015-12-10 ENCOUNTER — Other Ambulatory Visit (HOSPITAL_COMMUNITY): Payer: 59

## 2015-12-14 ENCOUNTER — Other Ambulatory Visit (HOSPITAL_COMMUNITY): Payer: 59

## 2015-12-15 ENCOUNTER — Ambulatory Visit (INDEPENDENT_AMBULATORY_CARE_PROVIDER_SITE_OTHER): Payer: 59 | Admitting: Psychiatry

## 2015-12-15 ENCOUNTER — Encounter (HOSPITAL_COMMUNITY): Payer: Self-pay | Admitting: Psychiatry

## 2015-12-15 VITALS — BP 128/72 | HR 70 | Ht 63.5 in | Wt 189.6 lb

## 2015-12-15 DIAGNOSIS — G4700 Insomnia, unspecified: Secondary | ICD-10-CM | POA: Diagnosis not present

## 2015-12-15 DIAGNOSIS — F1021 Alcohol dependence, in remission: Secondary | ICD-10-CM

## 2015-12-15 DIAGNOSIS — F411 Generalized anxiety disorder: Secondary | ICD-10-CM | POA: Diagnosis not present

## 2015-12-15 DIAGNOSIS — F332 Major depressive disorder, recurrent severe without psychotic features: Secondary | ICD-10-CM

## 2015-12-15 NOTE — Progress Notes (Signed)
Patient ID: Kendra Ball, female   DOB: 02/14/1946, 70 y.o.   MRN: [id number] Patient ID: Kendra Ball, female   DOB: 1945/12/21, 70 y.o.   MRN: [id number]  [facility] Progress Note  Kendra Ball [id number] 70 y.o.  12/15/2015 9:29 AM  Chief Complaint: "I am better"  History of Present Illness: States she quit drinking alcohol and using benzos on May 4th. She completed CD-IOP. Pt goes to [facility] meetings about 3-4x/week and has a sponsor. Pt has started working on the 12 step program. Pt's husband drinks alcohol so he has it hidden in the house. Pt has not searched for it. She took Campral for a while. She has some mental cravings but doesn't want to mess up again. Pt is worried about relapse.   She feels better than she has in 20 yrs. Mood has improved. She is reading and journaling. She is spending time with her grandkids and really enjoying them. Depression comes on a few days a month. She is using coping techniques to help. She gets tense and down on those days. Motivation is very low and symptoms resolve on their own. Denies crying spells, anhedonia, isolation, worthlessness and hopelessness.   Her anxiety is present but better than it was a few months ago.  Buspar is helping a lot. It comes and goes through out the day.  Sleep is good with Trazodone. Energy is good and she is no longer napping. Appetite is good and she has lost 13 lbs since she stopped drinking.   Taking meds as prescribed and denies SE. States she feels good so meds are working.   Pt is starting therapy soon and is looking forward to it.   Suicidal Ideation: No  Plan Formed: No Patient has means to carry out plan: No  Homicidal Ideation: No Plan Formed: No Patient has means to carry out plan: No  Review of Systems: Psychiatric: Agitation: No Hallucination: No Depressed Mood: Yes Insomnia: No Hypersomnia: No Altered Concentration: No Feels Worthless: No Grandiose Ideas: No Belief  In Special Powers: No New/Increased Substance Abuse: No Compulsions: No  Neurologic: Headache: No Seizure: No Paresthesias: No   Review of Systems  Constitutional: Negative for chills, fever and malaise/fatigue.  HENT: Negative for congestion, ear discharge, nosebleeds and sore throat.   Eyes: Positive for blurred vision. Negative for double vision, pain and redness.  Respiratory: Negative for cough, shortness of breath and wheezing.   Cardiovascular: Negative for chest pain, palpitations and leg swelling.  Gastrointestinal: Positive for diarrhea. Negative for abdominal pain, heartburn, nausea and vomiting.  Musculoskeletal: Positive for back pain and joint pain. Negative for neck pain.  Skin: Negative for itching and rash.  Neurological: Negative for dizziness, tremors, sensory change, seizures, loss of consciousness, weakness and headaches.  Psychiatric/Behavioral: Positive for depression. Negative for hallucinations, substance abuse and suicidal ideas. The patient is nervous/anxious. The patient does not have insomnia.      Past Medical Family, Social History: lives in [place] with her husband. Has 3 kids. Pt retired in 2010. She worked as a Network engineer at [employer].  reports that she has been smoking Cigarettes.  She has a 50.00 pack-year smoking history. She has never used smokeless tobacco. She reports that she drinks about 15.6 oz of alcohol per week . She reports that she uses drugs, including Benzodiazepines, about 20 times per week.  Family History  Problem Relation Age of Onset  . Cancer Mother     colon  .  Dementia Mother   . Depression Mother   . Heart disease Father 56  . Heart disease Sister 70    stents, pacemaker  . Depression Sister   . Heart disease Brother 71    CABG, MI  . Depression Brother   . Obesity Daughter   . Suicidality Daughter   . Bipolar disorder Daughter   . Obesity Son   . Mental illness Daughter     bipolar  . Obesity  Daughter   . Depression Sister   . Dementia Paternal Aunt   . Alcohol abuse Maternal Uncle    Past Medical History:  Diagnosis Date  . DEPRESSION 08/26/2008  . Hx of colonic polyps    First noted on  colonoscopy 2012  . Hypercholesteremia   . HYPERTENSION 08/26/2008  . HYPOTHYROIDISM 08/26/2008  . Obesity   . OSTEOPENIA 08/26/2008  . TOBACCO USER 12/25/2009    Outpatient Encounter Prescriptions as of 12/15/2015  Medication Sig  . alendronate (FOSAMAX) 70 MG tablet Take 1 tablet (70 mg total) by mouth once a week. Reported on 10/06/2015  . aspirin 81 MG tablet Take 81 mg by mouth daily.  . budesonide-formoterol (SYMBICORT) 160-4.5 MCG/ACT inhaler Inhale 2 puffs into the lungs 2 (two) times daily.  [name] buPROPion (WELLBUTRIN) 100 MG tablet Take 1 tablet (100 mg total) by mouth 2 (two) times daily.  . busPIRone (BUSPAR) 10 MG tablet Take 1 tablet (10 mg total) by mouth 3 (three) times daily.  . Cyanocobalamin (VITAMIN B-12 PO) Take 1 tablet by mouth daily.  . ferrous fumarate (HEMOCYTE - 106 MG FE) 325 (106 FE) MG TABS Take 1 tablet by mouth daily. Reported on 10/06/2015  . levothyroxine (SYNTHROID, LEVOTHROID) 100 MCG tablet Take 1 tablet (100 mcg total) by mouth daily before breakfast.  . lisinopril (PRINIVIL,ZESTRIL) 10 MG tablet Take 1 tablet (10 mg total) by mouth daily.  . Magnesium 250 MG TABS Take 250 mg by mouth 2 (two) times daily.   . Probiotic Product (ALIGN) 4 MG CAPS Take 1 capsule (4 mg total) by mouth daily.  . simvastatin (ZOCOR) 10 MG tablet Take 1 tablet (10 mg total) by mouth at bedtime.  . traZODone (DESYREL) 150 MG tablet Take 1 tablet (150 mg total) by mouth at bedtime.  [name] venlafaxine XR (EFFEXOR-XR) 75 MG 24 hr capsule Take 1 capsule (75 mg total) by mouth daily with breakfast.  . Zoster Vaccine Live, PF, (ZOSTAVAX) 60454 UNT/0.65ML injection Inject 19,400 Units into the skin once.   No facility-administered encounter medications on file as of 12/15/2015.     Past  Psychiatric History/Hospitalization(s): Anxiety: Yes Bipolar Disorder: No Depression: Yes Mania: No Psychosis: No Schizophrenia: No Personality Disorder: No Hospitalization for psychiatric illness: No History of Electroconvulsive Shock Therapy: No Prior Suicide Attempts: No  Physical Exam: Constitutional:  BP 128/72   Pulse 70   Ht 5' 3.5" (1.613 m)   Wt 189 lb 9.6 oz (86 kg)   BMI 33.06 kg/m   General Appearance: alert, oriented, no acute distress  Musculoskeletal: Strength & Muscle Tone: within normal limits Gait & Station: normal Patient leans: straight  Mental Status Examination/Evaluation: Objective: Attitude: Calm and cooperative  Appearance: Fairly Groomed, appears to be stated age  Eye Contact::  Good  Speech:  Clear and Coherent and Normal Rate  Volume:  Normal  Mood:  Depressed and anxious  Affect:  Congruent- brighter than at previous appt  Thought Process:  Linear and Logical  Orientation:  Full (Time, Place, and  Person)  Thought Content:  WDL  Suicidal Thoughts:  No  Homicidal Thoughts:  No  Judgement:  Fair  Insight:  Fair  Concentration: good  Memory: Immediate-good Recent-good Remote-good  Recall: fair  Language: fair  Gait and Station: normal  [employer] of Knowledge: average  Psychomotor Activity:  Tremor  Akathisia:  No  Handed:  Right  AIMS (if indicated): n/a  Assets:  Communication Skills Desire for Improvement Financial Resources/Insurance Housing Intimacy Leisure Time Resilience Social Support Talents/Skills Transportation Vocational/Educational      Assessment: MDD- recurrent, moderate. GAD. Insomnia; Alcohol use disorder-severe, in remission,  Nicotine dependence       Treatment Plan/Recommendations:  Plan of Care:  Medication management with supportive therapy. Risks/benefits and SE of the medication discussed. Pt verbalized understanding and verbal consent obtained for treatment.  Affirm with the patient  that the medications are taken as ordered. Patient expressed understanding of how their medications were to be used.     Laboratory: Reviewed labs 02/03/2015 CBC WNL  Psychotherapy: Therapy: brief supportive therapy provided. Discussed psychosocial stressors in detail.  - recommend pt stop all alcohol use  -discussed smoking cessation.    Medications:   Effexor XR 75mg  po qD for depression and anxiety Wellbutrin SR 100mg  po BID for depression Trazodone 150mg  po qHS for sleep Buspar 10mg  po TID for anxiety    Routine PRN Medications: Yes  Consultations: encouraged to f/up with PCP as needed, encouraged to start individual therapy  Safety Concerns: Pt denies SI and is at an acute low risk for suicide.Patient told to call clinic if any problems occur. Patient advised to go to ER if they should develop SI/HI, side effects, or if symptoms worsen. Has crisis numbers to call if needed. Pt verbalized understanding.   Other: F/up in 12 weeks or sooner if needed     [name], MD 12/15/2015

## 2015-12-17 ENCOUNTER — Ambulatory Visit (INDEPENDENT_AMBULATORY_CARE_PROVIDER_SITE_OTHER): Payer: 59 | Admitting: Clinical

## 2015-12-17 DIAGNOSIS — F1021 Alcohol dependence, in remission: Secondary | ICD-10-CM

## 2015-12-17 DIAGNOSIS — Z87898 Personal history of other specified conditions: Secondary | ICD-10-CM

## 2015-12-17 DIAGNOSIS — F411 Generalized anxiety disorder: Secondary | ICD-10-CM | POA: Diagnosis not present

## 2015-12-17 DIAGNOSIS — F331 Major depressive disorder, recurrent, moderate: Secondary | ICD-10-CM

## 2015-12-17 DIAGNOSIS — F1311 Sedative, hypnotic or anxiolytic abuse, in remission: Secondary | ICD-10-CM

## 2015-12-24 ENCOUNTER — Encounter (HOSPITAL_COMMUNITY): Payer: Self-pay | Admitting: Clinical

## 2015-12-24 NOTE — Progress Notes (Signed)
Comprehensive Clinical Assessment (CCA) Note  12/24/2015 Kendra Ball [id number]  Visit Diagnosis:      ICD-9-CM ICD-10-CM   1. MDD (major depressive disorder), recurrent episode, moderate (HCC) 296.32 F33.1   2. GAD (generalized anxiety disorder) 300.02 F41.1   3. Alcohol use disorder, severe, in early remission ([place]) 305.03 F10.21   4. Xanax use disorder, mild, in early remission 305.43 Z87.898       CCA Part One  Part One has been completed on paper by the patient.  (See scanned document in Chart Review)  CCA Part Two A  Intake/Chief Complaint:  CCA Intake With Chief Complaint CCA Part Two Date: 12/17/15 CCA Part Two Time: 26 Chief Complaint/Presenting Problem: Depression, alcohol and perscrition drug abuse, anxiety Patients Currently Reported Symptoms/Problems: in early recovery, battling anxiety and depression though I have some coping skills Individual's Strengths: "That I am willing to do what I need to do because, I don't want to be misserable again." Individual's Preferences: "I want to get stronger in the skills, learn all the skills I can and be strong with them, and deal with the crap in my mind that brings me down." Type of Services Patient Feels Are Needed: Individual Therapy  Initial Clinical Notes/Concerns: Levera Fishell is a 70 year old female who has a long history of depression and anxiety. Prior drank occassionally anf gradually.  started drinking Aug 2016 year ago, rapidly became daily. Began abusing Xanax after 6 months. 08/26/15 Family interveened and attended her session with clinician. Clinician refered her to ER for detox. She was then sent to [place] after completing treatment she returned to [facility] for [facility] IOP. She ha s completed that treatment and is returning for individual therapy  Mental Health Symptoms Depression:  Depression: Change in energy/activity, Difficulty Concentrating, Fatigue, Increase/decrease in appetite, Irritability,  Tearfulness  Mania:  Mania: N/A  Anxiety:   Anxiety: Irritability, Fatigue, Difficulty concentrating, Restlessness, Tension, Worrying (I worry all the time about stupid stuff)  Psychosis:  Psychosis: N/A  Trauma:  Trauma: N/A  Obsessions:  Obsessions: N/A  Compulsions:  Compulsions: N/A  Inattention:  Inattention: N/A  Hyperactivity/Impulsivity:  Hyperactivity/Impulsivity: N/A  Oppositional/Defiant Behaviors:  Oppositional/Defiant Behaviors: N/A  Borderline Personality:  Emotional Irregularity: N/A  Other Mood/Personality Symptoms:      Mental Status Exam Appearance and self-care  Stature:  Stature: Average  Weight:     Clothing:  Clothing: Casual  Grooming:  Grooming: Normal  Cosmetic use:  Cosmetic Use: Age appropriate  Posture/gait:  Posture/Gait: Normal  Motor activity:  Motor Activity: Not Remarkable  Sensorium  Attention:  Attention: Normal  Concentration:  Concentration: Normal  Orientation:  Orientation: X5  Recall/memory:  Recall/Memory: Normal  Affect and Mood  Affect:  Affect: Appropriate  Mood:  Mood: Depressed  Relating  Eye contact:  Eye Contact: Normal  Facial expression:  Facial Expression: Responsive  Attitude toward examiner:  Attitude Toward Examiner: Cooperative  Thought and Language  Speech flow: Speech Flow: Normal  Thought content:  Thought Content: Appropriate to mood and circumstances  Preoccupation:     Hallucinations:     Organization:     Transport planner of Knowledge:  Fund of Knowledge: Average  Intelligence:  Intelligence: Average  Abstraction:  Abstraction: Normal  Judgement:  Judgement: Normal  Reality Testing:  Reality Testing: Realistic  Insight:  Insight: Good  Decision Making:  Decision Making: Normal  Social Functioning  Social Maturity:  Social Maturity: Isolates  Social Judgement:  Social Judgement:  Normal  Stress  Stressors:  Stressors: Family conflict, Money, Transitions  Coping Ability:  Coping Ability:  English as a second language teacher Deficits:     Supports:      Family and Psychosocial History: Family history Marital status: Married Number of Years Married: 6 What types of issues is patient dealing with in the relationship?: We do not interact much - "There is no violence, fighting, there is no nothing. " I think he is depressed.[name] He is good hearted. He is not affectionate, I just get lonely.  I think he loves me the very best he can.  Are you sexually active?: No What is your sexual orientation?: Heterosexual Has your sexual activity been affected by drugs, alcohol, medication, or emotional stress?: No Does patient have children?: Yes How is patient's relationship with their children?:  good relationship with the oldest child -Lattie Haw . the middle child, Harrell Gave, has rejected the entire family. Nira Conn youngest is has paranoid schizophrenia. We have joint custody of her children  Childhood History:  Childhood History By whom was/is the patient raised?: Both parents Additional childhood history information: patient was the oldest of 48 children Father died when she was 69. This was very traumatic Description of patient's relationship with caregiver when they were a child: close How were you disciplined when you got in trouble as a child/adolescent?: appropriately Does patient have siblings?: Yes Number of Siblings: 5 Description of patient's current relationship with siblings: patient reports she is very close with all of her brothers and sisters Did patient suffer any verbal/emotional/physical/sexual abuse as a child?: No Did patient suffer from severe childhood neglect?: No Has patient ever been sexually abused/assaulted/raped as an adolescent or adult?: No Was the patient ever a victim of a crime or a disaster?: No Witnessed domestic violence?: No Has patient been effected by domestic violence as an adult?: No  CCA Part Two B  Employment/Work Situation: Employment / Work  Copywriter, 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 been impacted by current illness: No What is the longest time patient has a held a job?: 14 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 Has patient ever been in the [employer]?: No Has patient ever served in combat?: No Did You Receive Any Psychiatric Treatment/Services While in Passenger transport manager?: No Are There Guns or Other Weapons in [place]?: No  Education: Museum/gallery curator Currently Attending: No Name of [place]: [place]  Did Teacher, adult education From [employer]?: Yes Did Physicist, medical?: Yes What Type of College Degree Do you Have?: technical school Did Heritage manager?: No Did You Have An Individualized Education Program (IIEP): No Did You Have Any Difficulty At [employer]?: No  Religion: Religion/Spirituality Are You A Religious Person?: Yes What is Your Religious Affiliation?: Baptist How Might This Affect Treatment?: I believe in God  Leisure/Recreation: Leisure / Recreation Leisure and Hobbies: "reading camping cross word puzzles and jig saw puzzles. Bible journaling."  Exercise/Diet: Exercise/Diet Do You Exercise?: Yes What Type of Exercise Do You Do?: Run/Walk How Many Times a Week Do You Exercise?: 1-3 times a week Have You Gained or Lost A Significant Amount of Weight in the Past Six Months?: Yes-Lost Number of Pounds Lost?: 12 (wasn't trying but am happy about it) Do You Follow a Special Diet?: No Do You Have Any Trouble Sleeping?: Yes  CCA Part Two C  Alcohol/Drug Use: Alcohol / Drug Use Pain Medications: See Chart  Prescriptions: See Chart  Over  the Counter: See Chart  History of alcohol / drug use?: Yes Longest period of sobriety (when/how long): most of her life   - Problem began in Aug 2016  Negative Consequences of Use: Personal relationships, Financial Withdrawal Symptoms:  (4 months sober) Substance #1 Name  of Substance 1: ALcohol  1 - Age of First Use: 84  -- last year began problem drinking age 26 1 - Amount (size/oz): just casual partying  1 - Frequency: Daily after Aug 2016 - prior was casual rare drinker 1 - Duration: daily for 1 year 1 - Last Use / Amount: 6-8 drinks a day Substance #2 Name of Substance 2: Xanax 2 - Age of First Use: 26 years ago as needed - 30 day px would last 4 months.  build up began 10 years ago 2 - Amount (size/oz): 4 .5mg  a day 2 - Frequency: daily  2 - Duration: 6 months 2 - Last Use / Amount: 4 -.5 mg                  CCA Part Three  ASAM's:  Six Dimensions of Multidimensional Assessment  Dimension 1:  Acute Intoxication and/or Withdrawal Potential:  Dimension 1:  Comments: Has medically detoxed and is in early recovery  Dimension 2:  Biomedical Conditions and Complications:     Dimension 3:  Emotional, Behavioral, or Cognitive Conditions and Complications:     Dimension 4:  Readiness to Change:     Dimension 5:  Relapse, Continued use, or Continued Problem Potential:     Dimension 6:  Recovery/Living Environment:      Substance use Disorder (SUD)    Social Function:  Social Functioning Social Maturity: Isolates Social Judgement: Normal  Stress:  Stress Stressors: Family conflict, Money, Transitions Coping Ability: Overwhelmed Patient Takes Medications The Way The Doctor Instructed?: Yes Priority Risk: Low Acuity  Risk Assessment- Self-Harm Potential: Risk Assessment For Self-Harm Potential Thoughts of Self-Harm: No current thoughts Method: No plan Availability of Means: No access/NA  Risk Assessment -Dangerous to Others Potential: Risk Assessment For Dangerous to Others Potential Method: No Plan Availability of Means: No access or NA Intent: Vague intent or NA Notification Required: No need or identified person  DSM5 Diagnoses: Patient Active Problem List   Diagnosis Date Noted  . Coronary atherosclerosis 10/22/2015  . Fecal  incontinence 10/16/2015  . Chronic diarrhea 10/13/2015  . Loss of weight 10/13/2015  . History of partial colectomy 10/13/2015  . History of colon polyps 10/13/2015  . Withdrawal syndrome ([place]) 09/28/2015  . Current smoker 09/14/2015  . Alcohol use disorder, moderate, dependence ([place]) 09/11/2015  . Insomnia 05/05/2015  . COPD (chronic obstructive pulmonary disease) with chronic bronchitis ([place]) 02/13/2015  . Pulmonary nodule seen on imaging study 02/13/2015  . Osteoporosis 11/11/2014  . Breast cancer screening 09/10/2014  . History of gastric bypass 09/10/2014  . Essential hypertension 05/22/2014  . GAD (generalized anxiety disorder) 01/30/2014  . Major depressive disorder, recurrent episode, moderate ([place]) 01/30/2014  . Dyslipidemia 10/03/2013  . Abnormal mammogram 10/01/2013  . BMI 34.0-34.9,adult 08/13/2013  . Preventative health care 08/13/2013  . Skin lesion of face 08/13/2013  . Family history of premature coronary heart disease 08/13/2013  . Hypothyroidism 08/26/2008    Patient Centered Plan: Patient is on the following Treatment Plan(s): Treatment plan to be formulated at next session Individual therapy 1x every 1-2 weeks, sessions to become less frequent as symptoms improve  Recommendations for Services/Supports/Treatments: Recommendations for Services/Supports/Treatments Recommendations For Services/Supports/Treatments: Individual Therapy,  Medication Management  Treatment Plan Summary:    Referrals to Alternative Service(s): Referred to Alternative Service(s):   Place:   Date:   Time:    Referred to Alternative Service(s):   Place:   Date:   Time:    Referred to Alternative Service(s):   Place:   Date:   Time:    Referred to Alternative Service(s):   Place:   Date:   Time:     [name] A

## 2015-12-30 ENCOUNTER — Ambulatory Visit (INDEPENDENT_AMBULATORY_CARE_PROVIDER_SITE_OTHER): Payer: Medicare Other | Admitting: Gastroenterology

## 2015-12-30 ENCOUNTER — Encounter: Payer: Self-pay | Admitting: Gastroenterology

## 2015-12-30 VITALS — BP 124/74 | HR 76 | Ht 63.0 in | Wt 185.6 lb

## 2015-12-30 DIAGNOSIS — Z800 Family history of malignant neoplasm of digestive organs: Secondary | ICD-10-CM

## 2015-12-30 DIAGNOSIS — K529 Noninfective gastroenteritis and colitis, unspecified: Secondary | ICD-10-CM

## 2015-12-30 DIAGNOSIS — R159 Full incontinence of feces: Secondary | ICD-10-CM

## 2015-12-30 DIAGNOSIS — Z86010 Personal history of colonic polyps: Secondary | ICD-10-CM | POA: Diagnosis not present

## 2015-12-30 MED ORDER — NA SULFATE-K SULFATE-MG SULF 17.5-3.13-1.6 GM/177ML PO SOLN: ORAL | 0 refills | Status: DC

## 2015-12-30 NOTE — Patient Instructions (Signed)
You have been scheduled for a colonoscopy. Please follow written instructions given to you at your visit today.  Please pick up your prep supplies at the pharmacy within the next 1-3 days. If you use inhalers (even only as needed), please bring them with you on the day of your procedure. Your physician has requested that you go to www.startemmi.com and enter the access code given to you at your visit today. This web site gives a general overview about your procedure. However, you should still follow specific instructions given to you by our office regarding your preparation for the procedure.  If you do not tolerate the suprep, you may instead use the miralax prep instructions given to you.  Please purchase the following medications over the counter and take as directed: Imodium as needed for diarrhea  If you are age 53 or older, your body mass index should be between 23-30. Your Body mass index is 32.88 kg/m. If this is out of the aforementioned range listed, please consider follow up with your Primary Care Provider.  If you are age 4 or younger, your body mass index should be between 19-25. Your Body mass index is 32.88 kg/m. If this is out of the aformentioned range listed, please consider follow up with your Primary Care Provider.

## 2015-12-30 NOTE — Progress Notes (Signed)
HPI :  70 y/o female here with a history of depression, alcoholism, and obesity s/p gastric bypass, here for a new patient visit for change in bowel habits to include diarrhea, fecal incontinence, and weight loss.   She reports ongoing issues with diarrhea, she thinks about 6 months or so, getting worse over time. She can have anywhere from 3 to 8 BMs per day. Stools are always loose. She has increased urgency with her stools. She is not having any blood in the stools. With the urgency she is having some episodes of incontinence. She has had some incontinence when sleeping and some nocturnal symptoms. She has some cramping with the diarrhea. She has lost roughly 19 lbs since May, she thinks due to these symptoms.   She reports having had stool studies recently per primary care which were negative for infection.   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 Mother had colon cancer diagnosed at age 41s.   She has been taking probiotics which she does not think it helped. She has a history of gastric bypass. She does not think she has any major medication changes since symptoms started, although  she was previously on Xanax and had inpatient treatment for alcoholism. She has had some changes in her antidepressants in May. She reports being sober and in AA at the present time. She continues to smoke cigarettes. She is interested in quitting but having a hard time stopping.   Colonoscopy 08/24/2010 - 3 rectal polyps removed with mass at 70cm? Lipoma - biopsies not available    Past Medical History:  Diagnosis Date  . DEPRESSION 08/26/2008  . Hx of colonic polyps    First noted on  colonoscopy 2012  . Hypercholesteremia   . HYPERTENSION 08/26/2008  . HYPOTHYROIDISM 08/26/2008  . Obesity   . OSTEOPENIA 08/26/2008  . TOBACCO USER 12/25/2009     Past Surgical History:  Procedure Laterality Date  . [place] SURGERY  2006  . CESAREAN  SECTION    . COLON RESECTION N/A 05/09/2014   Procedure: LAPAROSCOPIC HAND-ASSISTED EXTENDED RIGHT COLECTOMY, LAPAROSCOPIC LYSIS OF ADHESIONS, SPLENIC FLEXURE MOBILIZATION.;  Surgeon: [name], MD;  Location: [facility];  Service: General;  Laterality: N/A;  . DILATION AND CURETTAGE OF UTERUS     x2  . FOREARM FRACTURE SURGERY     right  . GASTRIC BYPASS    . TONSILLECTOMY     Family History  Problem Relation Age of Onset  . Cancer Mother     colon  . Dementia Mother   . Depression Mother   . Heart disease Father 50  . Heart disease Sister 60    stents, pacemaker  . Depression Sister   . Heart disease Brother 87    CABG, MI  . Depression Brother   . Obesity Daughter   . Suicidality Daughter   . Bipolar disorder Daughter   . Obesity Son   . Mental illness Daughter     bipolar  . Obesity Daughter   . Depression Sister   . Dementia Paternal Aunt   . Alcohol abuse Maternal Uncle    Social History  Substance Use Topics  . Smoking status: Current Every Day Smoker    Packs/day: 1.00    Years: 50.00    Types: Cigarettes  . Smokeless tobacco: Never Used  . Alcohol use 15.6 oz/week    10 Glasses of wine, 16 Shots of liquor per week  Comment: quit on Aug 27, 2015   Current Outpatient Prescriptions  Medication Sig Dispense Refill  . alendronate (FOSAMAX) 70 MG tablet Take 1 tablet (70 mg total) by mouth once a week. Reported on 10/06/2015 4 tablet 5  . aspirin 81 MG tablet Take 81 mg by mouth daily.    . budesonide-formoterol (SYMBICORT) 160-4.5 MCG/ACT inhaler Inhale 2 puffs into the lungs 2 (two) times daily. 1 Inhaler 6  . buPROPion (WELLBUTRIN) 100 MG tablet Take 1 tablet (100 mg total) by mouth 2 (two) times daily. 180 tablet 0  . busPIRone (BUSPAR) 10 MG tablet Take 1 tablet (10 mg total) by mouth 3 (three) times daily. 90 tablet 2  . Cyanocobalamin (VITAMIN B-12 PO) Take 1 tablet by mouth daily.    . ferrous fumarate (HEMOCYTE - 106 MG FE) 325 (106 FE) MG TABS Take 1  tablet by mouth daily. Reported on 10/06/2015    . levothyroxine (SYNTHROID, LEVOTHROID) 100 MCG tablet Take 1 tablet (100 mcg total) by mouth daily before breakfast. 30 tablet 2  . lisinopril (PRINIVIL,ZESTRIL) 10 MG tablet Take 1 tablet (10 mg total) by mouth daily. 30 tablet 5  . Magnesium 250 MG TABS Take 250 mg by mouth 2 (two) times daily.     . Probiotic Product (ALIGN) 4 MG CAPS Take 1 capsule (4 mg total) by mouth daily. 30 capsule 1  . simvastatin (ZOCOR) 10 MG tablet Take 1 tablet (10 mg total) by mouth at bedtime. 90 tablet 1  . traZODone (DESYREL) 150 MG tablet Take 1 tablet (150 mg total) by mouth at bedtime. 30 tablet 2  . venlafaxine XR (EFFEXOR-XR) 75 MG 24 hr capsule Take 1 capsule (75 mg total) by mouth daily with breakfast. 90 capsule 0  . Zoster Vaccine Live, PF, (ZOSTAVAX) 63893 UNT/0.65ML injection Inject 19,400 Units into the skin once. 1 each 0   No current facility-administered medications for this visit.    Allergies  Allergen Reactions  . Morphine Sulfate     Swelling around injection area     Review of Systems: All systems reviewed and negative except where noted in HPI.   Lab Results  Component Value Date   WBC 7.6 10/13/2015   HGB 14.2 10/13/2015   HCT 42.7 10/13/2015   MCV 97.9 10/13/2015   PLT 331 10/13/2015    Lab Results  Component Value Date   CREATININE 0.74 10/13/2015   BUN 14 10/13/2015   NA 137 10/13/2015   K 4.5 10/13/2015   CL 103 10/13/2015   CO2 24 10/13/2015    Lab Results  Component Value Date   ALT 18 10/13/2015   AST 19 10/13/2015   ALKPHOS 44 10/13/2015   BILITOT 0.4 10/13/2015    Lab Results  Component Value Date   ESRSEDRATE 9 10/13/2015    Lab Results  Component Value Date   CRP <0.5 10/13/2015   Stool negative for ova / parasite Stool GI pathogen panel negative  Physical Exam: BP 124/74 (BP Location: Left Arm, Patient Position: Sitting, Cuff Size: Normal)   Pulse 76   Ht _0  (1.6 m)   Wt 185 lb 9.6  oz (84.2 kg)   BMI 32.88 kg/m  Constitutional: Pleasant,well-developed, female in no acute distress. HEENT: Normocephalic and atraumatic. Conjunctivae are normal. No scleral icterus. Neck supple.  Cardiovascular: Normal rate, regular rhythm.  Pulmonary/chest: Effort normal and breath sounds normal. No wheezing, rales or rhonchi. Abdominal: Soft, protuberant, nontender. Bowel sounds active throughout. There are no masses  palpable. No hepatomegaly. Extremities: no edema Lymphadenopathy: No cervical adenopathy noted. Neurological: Alert and oriented to person place and time. Skin: Skin is warm and dry. No rashes noted. Psychiatric: Normal mood and affect. Behavior is normal.   ASSESSMENT AND PLAN: 70 y/o female with history as above, s/p gastric bypass and colonic resection of lipoma causing intussusception, here for evaluation of chronic loose stools and fecal incontinence. Workup with labs including normal ESR, CRP, and infectious stool studies negative. She otherwise has a strong FH of colon cancer in her mother at age 44, and history of colon polyps, due for surveillance colonoscopy at this time.   I discussed ddx with her for symptoms. Recommend colonoscopy at this time to perform her screening, will also rule out IBD / microscopic colitis with biopsies. In the interim, will try immodium once to twice daily and additional dosing PRN. If colonoscopy is negative and symptoms persist, will further workup. She agreed with the plan.    Cellar, MD [facility] Pager [phone]  CC: Ma Hillock, DO

## 2016-01-14 ENCOUNTER — Encounter: Payer: Self-pay | Admitting: *Deleted

## 2016-01-14 ENCOUNTER — Other Ambulatory Visit: Payer: Self-pay | Admitting: *Deleted

## 2016-01-14 DIAGNOSIS — Z9049 Acquired absence of other specified parts of digestive tract: Secondary | ICD-10-CM

## 2016-01-14 DIAGNOSIS — K529 Noninfective gastroenteritis and colitis, unspecified: Secondary | ICD-10-CM

## 2016-01-14 DIAGNOSIS — E039 Hypothyroidism, unspecified: Secondary | ICD-10-CM

## 2016-01-14 DIAGNOSIS — R634 Abnormal weight loss: Secondary | ICD-10-CM

## 2016-01-14 NOTE — Telephone Encounter (Signed)
Left message for patient needs office visit also sent message in my chart.

## 2016-01-18 ENCOUNTER — Ambulatory Visit (INDEPENDENT_AMBULATORY_CARE_PROVIDER_SITE_OTHER): Payer: 59 | Admitting: Clinical

## 2016-01-18 DIAGNOSIS — F1311 Sedative, hypnotic or anxiolytic abuse, in remission: Secondary | ICD-10-CM

## 2016-01-18 DIAGNOSIS — Z87898 Personal history of other specified conditions: Secondary | ICD-10-CM

## 2016-01-18 DIAGNOSIS — F411 Generalized anxiety disorder: Secondary | ICD-10-CM

## 2016-01-18 DIAGNOSIS — F331 Major depressive disorder, recurrent, moderate: Secondary | ICD-10-CM | POA: Diagnosis not present

## 2016-01-18 DIAGNOSIS — F1021 Alcohol dependence, in remission: Secondary | ICD-10-CM

## 2016-01-18 NOTE — Progress Notes (Signed)
   THERAPIST PROGRESS NOTE  Session Time: 3:33 -4:40 Participation Level: Active  Behavioral Response: CasualAlertAnxious  Type of Therapy: Individual Therapy  Treatment Goals addressed: Improve psychiatric symptoms, Elevate mood ( increase social interaction) Improve unhelpful thought patterns, decrease irrational worries and fears, interpersonal relationship skills ( desire and ability to interact with those she loves and cares about)  Interventions: CBT and Motivational Interviewing, Grounding and Mindfulness Techniques, psychoeducation  Summary: Kendra Ball is a 70 y.o. female who presents with Major Depressive Disorder, recurrent, moderate and Generalized Anxiety Disorder  Suicidal/Homicidal: No -without intent/plan  Therapist Response:  Kendra Ball met with clinician for an individual session. [name] discussed her psychiatric symptoms, and her current life events. [name] shared that her anxiety has been really high lately. Clinician asked open ended questions and [name] shared a bout some of the thoughts she was having it increased her anxiety. Client and clinician used a 7 panel thought record sheet. Clinician asked open ended questions and [name] identified her triggering events and  Emotions. [name] rated her emotions. [name] listed her negative automatic thoughts. [name] was able to identify the evidence for and against those thoughts. [name] was then able to formulate healthier alternative thoughts. Client and clinician discussed the thought emotion connection. Client and clinician discussed how our thoughts change our actions. Client and clinician discussed interpersonal relationship skills. [name] had the insight that if she changed her thoughts are interactions with improve and her anxiety would decrease. Clinician gave Kendra Ball anxiety packet 1 and 2. [name] agreed to complete these packets and bring them back with her next session. Client and clinician discussed  grounding and mindfulness techniques. Client and clinician practiced one of the techniques to gather. While this was a review for [name] she had not been using them and stated that she would use them daily and tell next session.  Plan: Return again in 1 -2 weeks.  Diagnosis: Axis I: Major Depressive Disorder, recurrent, moderate and Generalized Anxiety Disorder  Treasure Ingrum A, LCSW 01/18/2016

## 2016-01-21 ENCOUNTER — Encounter: Payer: Self-pay | Admitting: Gastroenterology

## 2016-01-21 ENCOUNTER — Ambulatory Visit (AMBULATORY_SURGERY_CENTER): Payer: Medicare Other | Admitting: Gastroenterology

## 2016-01-21 VITALS — BP 109/47 | HR 59 | Temp 98.9°F | Resp 23 | Ht 63.0 in | Wt 185.0 lb

## 2016-01-21 DIAGNOSIS — R197 Diarrhea, unspecified: Secondary | ICD-10-CM

## 2016-01-21 DIAGNOSIS — D123 Benign neoplasm of transverse colon: Secondary | ICD-10-CM

## 2016-01-21 DIAGNOSIS — Z800 Family history of malignant neoplasm of digestive organs: Secondary | ICD-10-CM

## 2016-01-21 DIAGNOSIS — K529 Noninfective gastroenteritis and colitis, unspecified: Secondary | ICD-10-CM | POA: Diagnosis not present

## 2016-01-21 DIAGNOSIS — Z1211 Encounter for screening for malignant neoplasm of colon: Secondary | ICD-10-CM

## 2016-01-21 MED ORDER — SODIUM CHLORIDE 0.9 % IV SOLN: 500.0000 mL | INTRAVENOUS | Status: DC

## 2016-01-21 NOTE — Progress Notes (Signed)
Spontaneous respirations throughout. VSS. Resting comfortably. To PACU on room air. Report to  Penny RN.  

## 2016-01-21 NOTE — Op Note (Signed)
[place] Patient Name: Kendra Ball Procedure Date: 01/21/2016 9:02 AM MRN: [id number] Endoscopist: Remo Lipps P. Havery Moros , MD Age: 70 Referring MD:  Date of Birth: [birth year] Gender: Female Account #: [id number] Procedure:                Colonoscopy Indications:              Chronic diarrhea, Fecal incontinence, family                            history of colon cancer in mother at age 59,                            screening exam Medicines:                Monitored Anesthesia Care Procedure:                Pre-Anesthesia Assessment:                           - Prior to the procedure, a History and Physical                            was performed, and patient medications and                            allergies were reviewed. The patient's tolerance of                            previous anesthesia was also reviewed. The risks                            and benefits of the procedure and the sedation                            options and risks were discussed with the patient.                            All questions were answered, and informed consent                            was obtained. Prior Anticoagulants: The patient has                            taken aspirin, last dose was 1 day prior to                            procedure. ASA Grade Assessment: III - A patient                            with severe systemic disease. After reviewing the                            risks and benefits, the patient was deemed in  satisfactory condition to undergo the procedure.                           After obtaining informed consent, the colonoscope                            was passed under direct vision. Throughout the                            procedure, the patient's blood pressure, pulse, and                            oxygen saturations were monitored continuously. The                            Model CF-[id number] scope was  introduced                            through the anus and advanced to the the                            ileocolonic anastomosis. The colonoscopy was                            performed without difficulty. The patient tolerated                            the procedure well. The quality of the bowel                            preparation was adequate. The rectum, surgical                            anastomosis, ileum were photographed. Scope In: 9:06:51 AM Scope Out: 9:23:56 AM Scope Withdrawal Time: 0 hours 14 minutes 33 seconds  Total Procedure Duration: 0 hours 17 minutes 5 seconds  Findings:                 The perianal and digital rectal examinations were                            normal.                           There was evidence of a prior end-to-end                            ileo-colonic anastomosis in the proximal transverse                            colon. This was patent and was characterized by                            healthy appearing mucosa.  The terminal ileum appeared normal.                           A localized area of erythematous mucosa was found                            in the transverse colon at the anastomosis. This                            may be inflammatory tissue along the anastomosis,                            did not appear overtly consistent with a tru polyp,                            but biopsies were taken with a cold forceps for                            histology to ensure no adenomatous change                           A localized area of erythematous mucosa was found                            in the sigmoid colon. I think this likely                            represented endoscopic trauma but biopsies were                            taken with a cold forceps for histology to ensure                            no adenomatous changes.                           The exam was otherwise without abnormality. No                             inflammatory changes appreciated.                           Biopsies for histology were taken with a cold                            forceps from the left colon and transverse colon                            for evaluation of microscopic colitis.                           Non-bleeding internal hemorrhoids were found during  retroflexion. Complications:            No immediate complications. Estimated blood loss:                            Minimal. Estimated Blood Loss:     Estimated blood loss was minimal. Impression:               - Patent end-to-end ileo-colonic anastomosis,                            characterized by healthy appearing mucosa.                           - The examined portion of the ileum was normal.                           - Erythematous mucosa in the transverse colon at                            anastomosis. Biopsied to ensure no adenomatous                            changes.                           - Erythematous mucosa in the sigmoid colon,                            biopsied to ensure no adenomatous changes. Biopsied.                           - The examination was otherwise normal.                           - Non-bleeding internal hemorrhoids.                           - Biopsies were taken with a cold forceps from the                            left colon and transverse colon for evaluation of                            microscopic colitis. Recommendation:           - Patient has a contact number available for                            emergencies. The signs and symptoms of potential                            delayed complications were discussed with the                            patient. Return to normal activities tomorrow.  Written discharge instructions were provided to the                            patient.                           - Resume previous diet.                            - Continue present medications.                           - Await pathology results.                           - Repeat colonoscopy is recommended for                            surveillance. The colonoscopy date will be                            determined after pathology results from today's                            exam become available for review. Remo Lipps P. Armbruster, MD 01/21/2016 9:32:50 AM This report has been signed electronically.

## 2016-01-21 NOTE — Patient Instructions (Signed)
YOU HAD AN ENDOSCOPIC PROCEDURE TODAY AT [facility]:   Refer to the procedure report that was given to you for any specific questions about what was found during the examination.  If the procedure report does not answer your questions, please call your gastroenterologist to clarify.  If you requested that your care partner not be given the details of your procedure findings, then the procedure report has been included in a sealed envelope for you to review at your convenience later.  YOU SHOULD EXPECT: Some feelings of bloating in the abdomen. Passage of more gas than usual.  Walking can help get rid of the air that was put into your GI tract during the procedure and reduce the bloating. If you had a lower endoscopy (such as a colonoscopy or flexible sigmoidoscopy) you may notice spotting of blood in your stool or on the toilet paper. If you underwent a bowel prep for your procedure, you may not have a normal bowel movement for a few days.  Please Note:  You might notice some irritation and congestion in your nose or some drainage.  This is from the oxygen used during your procedure.  There is no need for concern and it should clear up in a day or so.  SYMPTOMS TO REPORT IMMEDIATELY:   Following lower endoscopy (colonoscopy or flexible sigmoidoscopy):  Excessive amounts of blood in the stool  Significant tenderness or worsening of abdominal pains  Swelling of the abdomen that is new, acute  Fever of 100F or higher  F  For urgent or emergent issues, a gastroenterologist can be reached at any hour by calling [phone].   DIET:  We do recommend a small meal at first, but then you may proceed to your regular diet.  Drink plenty of fluids but you should avoid alcoholic beverages for 24 hours.  ACTIVITY:  You should plan to take it easy for the rest of today and you should NOT DRIVE or use heavy machinery until tomorrow (because of the sedation medicines used during the test).     FOLLOW UP: Our staff will call the number listed on your records the next business day following your procedure to check on you and address any questions or concerns that you may have regarding the information given to you following your procedure. If we do not reach you, we will leave a message.  However, if you are feeling well and you are not experiencing any problems, there is no need to return our call.  We will assume that you have returned to your regular daily activities without incident.  If any biopsies were taken you will be contacted by phone or by letter within the next 1-3 weeks.  Please call us at [phone] if you have not heard about the biopsies in 3 weeks.    SIGNATURES/CONFIDENTIALITY: You and/or your care partner have signed paperwork which will be entered into your electronic medical record.  These signatures attest to the fact that that the information above on your After Visit Summary has been reviewed and is understood.  Full responsibility of the confidentiality of this discharge information lies with you and/or your care-partner.  AWAIT BIOPSY RESULTS   RESUME USUAL DIET AND MEDICATIONS

## 2016-01-22 ENCOUNTER — Encounter: Payer: Self-pay | Admitting: Family Medicine

## 2016-01-22 ENCOUNTER — Telehealth: Payer: Self-pay | Admitting: *Deleted

## 2016-01-22 ENCOUNTER — Ambulatory Visit (INDEPENDENT_AMBULATORY_CARE_PROVIDER_SITE_OTHER): Payer: Medicare Other | Admitting: Family Medicine

## 2016-01-22 VITALS — BP 134/79 | HR 72 | Temp 98.2°F | Resp 20 | Wt 183.5 lb

## 2016-01-22 DIAGNOSIS — E039 Hypothyroidism, unspecified: Secondary | ICD-10-CM

## 2016-01-22 DIAGNOSIS — I10 Essential (primary) hypertension: Secondary | ICD-10-CM

## 2016-01-22 LAB — TSH: TSH: 2.24 mIU/L

## 2016-01-22 MED ORDER — LISINOPRIL 10 MG PO TABS: 10.0000 mg | ORAL_TABLET | Freq: Every day | ORAL | 1 refills | Status: DC

## 2016-01-22 MED ORDER — LEVOTHYROXINE SODIUM 100 MCG PO TABS: 100.0000 ug | ORAL_TABLET | Freq: Every day | ORAL | 0 refills | Status: DC

## 2016-01-22 NOTE — Progress Notes (Signed)
Subjective:    Patient ID: Kendra Ball, female    DOB: 08-02-1945, 70 y.o.   MRN: [id number]  HPI Patient presents for scheduled office visit on chronic medical conditions. Hypertension/hyperlipidemia: Patient reports compliance with lisinopril 10 mg daily, Zocor 10 mg daily. She takes a daily baby aspirin. She denies chest pain, shortness breath, lower extremity edema or dizziness. She occasionally checks her blood pressure at home and reports normal parameters.  Hypothyroidism: Patient reports compliance with levothyroxine 100 g daily on an empty stomach. She denies any constipation, change in diarrhea, flushing or palpitations.   Past Medical History:  Diagnosis Date  . Anxiety   . Arthritis   . DEPRESSION 08/26/2008  . Hx of colonic polyps    First noted on  colonoscopy 2012  . Hypercholesteremia   . HYPERTENSION 08/26/2008  . HYPOTHYROIDISM 08/26/2008  . Obesity   . OSTEOPENIA 08/26/2008  . Osteoporosis   . Substance abuse    inpatient and outpatient tx for substance abuse  . TOBACCO USER 12/25/2009   Allergies  Allergen Reactions  . Morphine Sulfate     Swelling around injection area   Social History   Social History  . Marital status: Married    Spouse name: N/A  . Number of children: 3  . Years of education: N/A   Occupational History  . [employer]   Social History Main Topics  . Smoking status: Current Every Day Smoker    Packs/day: 1.00    Years: 50.00    Types: Cigarettes  . Smokeless tobacco: Never Used  . Alcohol use Yes     Comment: quit on Aug 27, 2015  . Drug use:     Frequency: 20.0 times per week    Types: Benzodiazepines     Comment: last used May 2017  . Sexual activity: No   Other Topics Concern  . Not on file   Social History Narrative   Ms. Wold lives in [place] with her husband. She has 3 grown children and several grand children. She has joint custody of 3 of her grand children as her daughter has bipolar  disorder and has needed assistance with children in past.     Review of Systems Negative, with the exception of above mentioned in HPI     Objective:   Physical Exam BP 134/79 (BP Location: Right Arm, Patient Position: Sitting, Cuff Size: Normal)   Pulse 72   Temp 98.2 F (36.8 C)   Resp 20   Wt 183 lb 8 oz (83.2 kg)   SpO2 96%   BMI 32.51 kg/m  Gen: Afebrile. No acute distress. Nontoxic in appearance, well-developed, well-nourished Caucasian female. Neck: Supple, no thyromegaly HENT: AT. [place]. MMM.  CV: RRR no murmur, no edema, +2/4 P posterior tibialis pulses Chest: CTAB, no wheeze or crackles. Diminished air movement bilaterally.    Assessment & Plan:  Kendra Ball is a 70 y.o. female present for follow up on chronic medical  Essential hypertension/hyperlipidemia - Continue low-salt diet, exercise - lisinopril (PRINIVIL,ZESTRIL) 10 MG tablet; Take 1 tablet (10 mg total) by mouth daily.  Dispense: 90 tablet; Refill: 1 - Cholesterol screening will be completed her annual. - Follow-up 6 months  Hypothyroidism, unspecified hypothyroidism type - TSH collected today, refill Synthroid 100 g #301, if thyroid levels are normal, will then call in a prescription with 3 refills. - levothyroxine (SYNTHROID, LEVOTHROID) 100 MCG tablet; Take 1 tablet (100 mcg total) by mouth daily before breakfast.  Dispense: 30 tablet; Refill: 0 - TSH  Electronically Signed by: Howard Pouch, DO [facility] [place]

## 2016-01-22 NOTE — Telephone Encounter (Signed)
  Follow up Call-  Call back number 01/21/2016  Post procedure Call Back phone  # [phone]  Permission to leave phone message Yes  Some recent data might be hidden     Patient questions:  Do you have a fever, pain , or abdominal swelling? No. Pain Score  0 *  Have you tolerated food without any problems? Yes.    Have you been able to return to your normal activities? Yes.    Do you have any questions about your discharge instructions: Diet   No. Medications  No. Follow up visit  No.  Do you have questions or concerns about your Care? No.  Actions: * If pain score is 4 or above: No action needed, pain <4.

## 2016-01-22 NOTE — Patient Instructions (Addendum)
We will call you with results once available.  I have refilled the medications (lisinopril and thyroid med) Follow up every 6 months on hypertension.  Low salt diet.  We will complete cholesterol screen during annual physical.

## 2016-01-25 ENCOUNTER — Other Ambulatory Visit: Payer: Self-pay | Admitting: Family Medicine

## 2016-01-25 MED ORDER — LEVOTHYROXINE SODIUM 100 MCG PO TABS: 100.0000 ug | ORAL_TABLET | Freq: Every day | ORAL | 3 refills | Status: DC

## 2016-01-25 NOTE — Progress Notes (Signed)
Refills prescribed, TSH normal.

## 2016-01-28 ENCOUNTER — Ambulatory Visit (HOSPITAL_COMMUNITY): Payer: Self-pay | Admitting: Psychiatry

## 2016-02-01 ENCOUNTER — Other Ambulatory Visit (HOSPITAL_COMMUNITY): Payer: Self-pay | Admitting: Medical

## 2016-02-01 DIAGNOSIS — F411 Generalized anxiety disorder: Secondary | ICD-10-CM

## 2016-02-01 DIAGNOSIS — F331 Major depressive disorder, recurrent, moderate: Secondary | ICD-10-CM

## 2016-02-09 ENCOUNTER — Ambulatory Visit (HOSPITAL_COMMUNITY): Payer: Self-pay | Admitting: Clinical

## 2016-02-15 ENCOUNTER — Other Ambulatory Visit (HOSPITAL_COMMUNITY): Payer: Self-pay | Admitting: Medical

## 2016-02-15 DIAGNOSIS — F411 Generalized anxiety disorder: Secondary | ICD-10-CM

## 2016-02-15 DIAGNOSIS — F331 Major depressive disorder, recurrent, moderate: Secondary | ICD-10-CM

## 2016-02-15 DIAGNOSIS — F1020 Alcohol dependence, uncomplicated: Secondary | ICD-10-CM

## 2016-02-17 ENCOUNTER — Other Ambulatory Visit (HOSPITAL_COMMUNITY): Payer: Self-pay

## 2016-02-17 DIAGNOSIS — F1020 Alcohol dependence, uncomplicated: Secondary | ICD-10-CM

## 2016-02-17 DIAGNOSIS — F331 Major depressive disorder, recurrent, moderate: Secondary | ICD-10-CM

## 2016-02-17 DIAGNOSIS — F411 Generalized anxiety disorder: Secondary | ICD-10-CM

## 2016-02-17 MED ORDER — BUSPIRONE HCL 10 MG PO TABS: 10.0000 mg | ORAL_TABLET | Freq: Three times a day (TID) | ORAL | 0 refills | Status: DC

## 2016-02-18 ENCOUNTER — Ambulatory Visit (HOSPITAL_COMMUNITY): Payer: Self-pay | Admitting: Clinical

## 2016-02-23 ENCOUNTER — Ambulatory Visit (INDEPENDENT_AMBULATORY_CARE_PROVIDER_SITE_OTHER): Payer: 59 | Admitting: Psychiatry

## 2016-02-23 ENCOUNTER — Encounter (HOSPITAL_COMMUNITY): Payer: Self-pay | Admitting: Psychiatry

## 2016-02-23 DIAGNOSIS — Z818 Family history of other mental and behavioral disorders: Secondary | ICD-10-CM

## 2016-02-23 DIAGNOSIS — F5105 Insomnia due to other mental disorder: Secondary | ICD-10-CM

## 2016-02-23 DIAGNOSIS — F411 Generalized anxiety disorder: Secondary | ICD-10-CM | POA: Diagnosis not present

## 2016-02-23 DIAGNOSIS — F1020 Alcohol dependence, uncomplicated: Secondary | ICD-10-CM | POA: Diagnosis not present

## 2016-02-23 DIAGNOSIS — F99 Mental disorder, not otherwise specified: Secondary | ICD-10-CM

## 2016-02-23 DIAGNOSIS — Z811 Family history of alcohol abuse and dependence: Secondary | ICD-10-CM

## 2016-02-23 DIAGNOSIS — F331 Major depressive disorder, recurrent, moderate: Secondary | ICD-10-CM | POA: Diagnosis not present

## 2016-02-23 DIAGNOSIS — Z8249 Family history of ischemic heart disease and other diseases of the circulatory system: Secondary | ICD-10-CM

## 2016-02-23 MED ORDER — TRAZODONE HCL 150 MG PO TABS
150.0000 mg | ORAL_TABLET | Freq: Every day | ORAL | 0 refills | Status: DC
Start: 2016-02-23 — End: 2016-05-29

## 2016-02-23 MED ORDER — BUPROPION HCL 100 MG PO TABS: 100.0000 mg | ORAL_TABLET | Freq: Two times a day (BID) | ORAL | 0 refills | Status: DC

## 2016-02-23 MED ORDER — VENLAFAXINE HCL ER 75 MG PO CP24: 75.0000 mg | ORAL_CAPSULE | Freq: Every day | ORAL | 0 refills | Status: DC

## 2016-02-23 NOTE — Progress Notes (Signed)
Patient ID: Kendra Ball, female   DOB: 1946/01/15, 70 y.o.   MRN: [id number] Patient ID: Kendra Ball, female   DOB: 18-Apr-1946, 70 y.o.   MRN: [id number]  [facility] Progress Note  Kendra Ball [id number] 70 y.o.  02/23/2016 8:41 AM  Chief Complaint: "I am trying to regroup"  History of Present Illness: Anxiety  Presents for follow-up visit. Symptoms include decreased concentration and nervous/anxious behavior. Patient reports no chest pain, compulsions, dizziness, excessive worry, hyperventilation, insomnia, muscle tension, nausea, palpitations, panic, restlessness, shortness of breath or suicidal ideas. Symptoms occur occasionally. The severity of symptoms is mild. The quality of sleep is good. Nighttime awakenings: none.   Compliance with medications is 76-100%.  Depression         This is a recurrent problem.  The current episode started more than 1 month ago.   The onset quality is gradual.   The problem occurs intermittently.  The most recent episode lasted 2 days.    The problem has been gradually improving since onset.  Associated symptoms include decreased concentration, fatigue, irritable, decreased interest and sad.  Associated symptoms include no helplessness, no hopelessness, does not have insomnia, no restlessness, no headaches and no suicidal ideas.     The symptoms are aggravated by nothing.  Past treatments include other medications.  Compliance with treatment is good.  Previous treatment provided significant relief.  Past medical history includes anxiety.   Alcohol Problem  Pertinent negatives include no delusions, hallucinations, intoxication, loss of consciousness, seizures, self-injury, somnolence, violence or weakness. This is a chronic problem. The current episode started more than 1 month ago. The problem has been resolved since onset. Suspected agents include alcohol. Pertinent negatives include no fever, injury, nausea or vomiting. Past  treatments include Alcoholics Anonymous. The treatment provided significant relief. Her past medical history is significant for addiction treatment and a mental illness.    Subjective: Last week she was caring for her 3 grandkids due to her daughter being in the hospital.   States she quit drinking alcohol and using benzos on May 4th. . Pt goes to [place] meetings about 3-4x/week and has a sponsor. Pt has started working on the 12 step program. Pt's husband drinks alcohol so he has it hidden in the house. Pt has not searched for it. She has some mental cravings but doesn't want to mess up again. Pt is worried about relapse.   She is reading and journaling. She is spending time with her grandkids and really enjoying them. Depression comes on a few days a month. She is using coping techniques to help. She gets tense and down on those days. Motivation is very low and symptoms resolve on their own. Denies crying spells, anhedonia, isolation, worthlessness and hopelessness.   Her anxiety is present but better than it was a few months ago.  Buspar is helping a lot. It comes and goes through out the day.  Sleep is good with Trazodone. Energy is good and she is no longer napping. Appetite is good and she has lost 13 lbs since she stopped drinking.   Taking meds as prescribed and denies SE. States she feels good so meds are working.   Pt is in therapy and it is going well. .   Suicidal Ideation: No  Plan Formed: No Patient has means to carry out plan: No  Homicidal Ideation: No Plan Formed: No Patient has means to carry out plan: No  Review of Systems: Psychiatric: Agitation: No  Hallucination: No Depressed Mood: Yes Insomnia: No Hypersomnia: No Altered Concentration: No Feels Worthless: No Grandiose Ideas: No Belief In Special Powers: No New/Increased Substance Abuse: No Compulsions: No  Neurologic: Headache: No Seizure: No Paresthesias: No   Review of Systems  Constitutional: Positive  for fatigue. Negative for chills, fever and malaise/fatigue.  HENT: Negative for congestion, ear discharge, nosebleeds and sore throat.   Eyes: Positive for blurred vision. Negative for double vision, pain and redness.  Respiratory: Negative for cough, shortness of breath and wheezing.   Cardiovascular: Negative for chest pain, palpitations and leg swelling.  Gastrointestinal: Positive for diarrhea. Negative for abdominal pain, heartburn, nausea and vomiting.  Musculoskeletal: Positive for back pain and joint pain. Negative for neck pain.  Skin: Negative for itching and rash.  Neurological: Negative for dizziness, tremors, sensory change, seizures, loss of consciousness, weakness and headaches.  Psychiatric/Behavioral: Positive for decreased concentration and depression. Negative for hallucinations, self-injury, substance abuse and suicidal ideas. The patient is nervous/anxious. The patient does not have insomnia.      Past Medical Family, Social History: lives in [place] with her husband. Has 3 kids. Pt retired in 2010. She worked as a Network engineer at [employer].  reports that she has been smoking Cigarettes.  She has a 50.00 pack-year smoking history. She has never used smokeless tobacco. She reports that she drinks alcohol. She reports that she uses drugs, including Benzodiazepines, about 20 times per week.  Family History  Problem Relation Age of Onset  . Cancer Mother     colon  . Dementia Mother   . Depression Mother   . Heart disease Father 84  . Heart disease Sister 89    stents, pacemaker  . Depression Sister   . Heart disease Brother 72    CABG, MI  . Depression Brother   . Obesity Daughter   . Suicidality Daughter   . Bipolar disorder Daughter   . Obesity Son   . Mental illness Daughter     bipolar  . Obesity Daughter   . Depression Sister   . Dementia Paternal Aunt   . Alcohol abuse Maternal Uncle   . Colon cancer Maternal Aunt   . Stomach cancer  Maternal Grandfather    Past Medical History:  Diagnosis Date  . Anxiety   . Arthritis   . DEPRESSION 08/26/2008  . Hx of colonic polyps    First noted on  colonoscopy 2012  . Hypercholesteremia   . HYPERTENSION 08/26/2008  . HYPOTHYROIDISM 08/26/2008  . Obesity   . OSTEOPENIA 08/26/2008  . Osteoporosis   . Substance abuse    inpatient and outpatient tx for substance abuse  . TOBACCO USER 12/25/2009    Outpatient Encounter Prescriptions as of 02/23/2016  Medication Sig  . alendronate (FOSAMAX) 70 MG tablet Take 1 tablet (70 mg total) by mouth once a week. Reported on 10/06/2015  . aspirin 81 MG tablet Take 81 mg by mouth daily.  . budesonide-formoterol (SYMBICORT) 160-4.5 MCG/ACT inhaler Inhale 2 puffs into the lungs 2 (two) times daily.  [name] buPROPion (WELLBUTRIN) 100 MG tablet Take 1 tablet (100 mg total) by mouth 2 (two) times daily.  . busPIRone (BUSPAR) 10 MG tablet Take 1 tablet (10 mg total) by mouth 3 (three) times daily.  . Cyanocobalamin (VITAMIN B-12 PO) Take 1 tablet by mouth daily.  . ferrous fumarate (HEMOCYTE - 106 MG FE) 325 (106 FE) MG TABS Take 1 tablet by mouth daily. Reported on 10/06/2015  . levothyroxine (SYNTHROID,  LEVOTHROID) 100 MCG tablet Take 1 tablet (100 mcg total) by mouth daily before breakfast.  . lisinopril (PRINIVIL,ZESTRIL) 10 MG tablet Take 1 tablet (10 mg total) by mouth daily.  . Magnesium 250 MG TABS Take 250 mg by mouth 2 (two) times daily.   . Probiotic Product (ALIGN) 4 MG CAPS Take 1 capsule (4 mg total) by mouth daily.  . simvastatin (ZOCOR) 10 MG tablet Take 1 tablet (10 mg total) by mouth at bedtime.  . traZODone (DESYREL) 150 MG tablet Take 1 tablet (150 mg total) by mouth at bedtime.  [name] venlafaxine XR (EFFEXOR-XR) 75 MG 24 hr capsule Take 1 capsule (75 mg total) by mouth daily with breakfast.   Facility-Administered Encounter Medications as of 02/23/2016  Medication  . 0.9 %  sodium chloride infusion    Past Psychiatric  History/Hospitalization(s): Anxiety: Yes Bipolar Disorder: No Depression: Yes Mania: No Psychosis: No Schizophrenia: No Personality Disorder: No Hospitalization for psychiatric illness: No History of Electroconvulsive Shock Therapy: No Prior Suicide Attempts: No  Physical Exam: Constitutional:  BP 126/74   Pulse 72   Ht 5' 3.5" (1.613 m)   Wt 180 lb 12.8 oz (82 kg)   BMI 31.52 kg/m   General Appearance: alert, oriented, no acute distress  Musculoskeletal: Strength & Muscle Tone: within normal limits Gait & Station: normal Patient leans: straight  Mental Status Examination/Evaluation: Objective: Attitude: Calm and cooperative  Appearance: Fairly Groomed, appears to be stated age  Eye Contact::  Good  Speech:  Clear and Coherent and Normal Rate  Volume:  Normal  Mood:  Depressed and anxious  Affect:  Congruent- brighter than at previous appt  Thought Process:  Linear and Logical  Orientation:  Full (Time, Place, and Person)  Thought Content:  WDL  Suicidal Thoughts:  No  Homicidal Thoughts:  No  Judgement:  Fair  Insight:  Fair  Concentration: good  Memory: Immediate-good Recent-good Remote-good  Recall: fair  Language: fair  Gait and Station: normal  [employer] of Knowledge: average  Psychomotor Activity:  Tremor  Akathisia:  No  Handed:  Right  AIMS (if indicated): n/a  Assets:  Communication Skills Desire for Improvement Financial Resources/Insurance Housing Intimacy Leisure Time Resilience Social Support Talents/Skills Transportation Vocational/Educational      Assessment: MDD- recurrent, moderate. GAD. Insomnia; Alcohol use disorder-severe, in remission,  Nicotine dependence       Treatment Plan/Recommendations:  Plan of Care:  Medication management with supportive therapy. Risks/benefits and SE of the medication discussed. Pt verbalized understanding and verbal consent obtained for treatment.  Affirm with the patient that the  medications are taken as ordered. Patient expressed understanding of how their medications were to be used.     Laboratory: Reviewed labs 02/03/2015 CBC WNL  Psychotherapy: Therapy: brief supportive therapy provided. Discussed psychosocial stressors in detail.  - recommend pt stop all alcohol use  -discussed smoking cessation.    Medications:   Effexor XR 75mg  po qD for depression and anxiety Wellbutrin SR 100mg  po BID for depression Trazodone 150mg  po qHS for sleep Buspar 10mg  po TID for anxiety    Routine PRN Medications: Yes  Consultations: encouraged to f/up with PCP as needed, encouraged to start individual therapy  Safety Concerns: Pt denies SI and is at an acute low risk for suicide.Patient told to call clinic if any problems occur. Patient advised to go to ER if they should develop SI/HI, side effects, or if symptoms worsen. Has crisis numbers to call if needed. Pt verbalized understanding.  Other: F/up in 3 months or sooner if needed     [name], MD 02/23/2016

## 2016-03-08 ENCOUNTER — Ambulatory Visit (INDEPENDENT_AMBULATORY_CARE_PROVIDER_SITE_OTHER): Payer: 59 | Admitting: Clinical

## 2016-03-08 DIAGNOSIS — Z87898 Personal history of other specified conditions: Secondary | ICD-10-CM | POA: Diagnosis not present

## 2016-03-08 DIAGNOSIS — F411 Generalized anxiety disorder: Secondary | ICD-10-CM | POA: Diagnosis not present

## 2016-03-08 DIAGNOSIS — F1311 Sedative, hypnotic or anxiolytic abuse, in remission: Secondary | ICD-10-CM

## 2016-03-08 DIAGNOSIS — F1021 Alcohol dependence, in remission: Secondary | ICD-10-CM | POA: Diagnosis not present

## 2016-03-08 DIAGNOSIS — F331 Major depressive disorder, recurrent, moderate: Secondary | ICD-10-CM | POA: Diagnosis not present

## 2016-03-08 NOTE — Progress Notes (Signed)
   THERAPIST PROGRESS NOTE  Session Time: 4:25 - 5:25  Participation Level: Active  Behavioral Response: CasualAlertAnxious and Depressed  Type of Therapy: Individual Therapy  Treatment Goals addressed: Improve psychiatric symptoms, Elevate mood (increase activity, increase social interaction) Improve unhelpful thought patterns, decrease irrational worries and fears,  relapse prevention skills, learn about diagnosis, healthy coping skills  Interventions: CBT and Motivational Interviewing, Grounding and Mindfulness Techniques, psychoeducation  Summary: Kendra Ball is a 70 y.o. female who presents with Major Depressive Disorder, recurrent, moderate and Generalized Anxiety Disorder, Alcohol use disorder, severe, in early remission, Xanax use disorder, mild, in early remission  Suicidal/Homicidal: No -without intent/plan  Therapist Response:  Kendra Ball met with clinician for an individual session. Kendra Ball discussed her psychiatric symptoms, her current life events, and her homework. Kendra Ball shared that she has continue remain sober but she is has been feeling depressed. Clinician asked open ended questions and Kendra Ball shared that she is feeling stress from the holidays, that her daughter has been hospitalized for mental health reasons. Client and clinician discussed her depression and anxiety. Kendra Ball shared about all the things she would like to get done.Clinician asked open ended questions about how Kendra Ball had been spending her time. Kendra Ball shared she had been watching a lot of news on television.  Client and clinician discussed her thoughts and emotions when she watches the news (anxious and numb) . Clinician asked open ended questions and Basya identified ways she could drastically decrease her news watching. She identified some steps she was willing to take. Kendra Ball and clinician discussed how she was using television to numb herself and how it increases her anxiety. Clinician asked open  ended questions and Kendra Ball shared about how good she felt ( mentally and physically) when she was more active and engaged. Client and clinician discussed relapse prevention skills.   Plan: Return again in 1 -2 weeks.  Diagnosis: Axis I: Major Depressive Disorder, recurrent, moderate and Generalized Anxiety Disorder, Alcohol use disorder, severe, in early remission, Xanax use disorder, mild, in early remission    , A, LCSW 03/08/2016  

## 2016-03-15 ENCOUNTER — Encounter (HOSPITAL_COMMUNITY): Payer: Self-pay | Admitting: Clinical

## 2016-04-10 ENCOUNTER — Other Ambulatory Visit (HOSPITAL_COMMUNITY): Payer: Self-pay | Admitting: Psychiatry

## 2016-04-10 DIAGNOSIS — F411 Generalized anxiety disorder: Secondary | ICD-10-CM

## 2016-04-10 DIAGNOSIS — F331 Major depressive disorder, recurrent, moderate: Secondary | ICD-10-CM

## 2016-04-10 DIAGNOSIS — F1020 Alcohol dependence, uncomplicated: Secondary | ICD-10-CM

## 2016-04-12 ENCOUNTER — Ambulatory Visit (INDEPENDENT_AMBULATORY_CARE_PROVIDER_SITE_OTHER): Payer: 59 | Admitting: Clinical

## 2016-04-12 DIAGNOSIS — F1311 Sedative, hypnotic or anxiolytic abuse, in remission: Secondary | ICD-10-CM

## 2016-04-12 DIAGNOSIS — Z87898 Personal history of other specified conditions: Secondary | ICD-10-CM | POA: Diagnosis not present

## 2016-04-12 DIAGNOSIS — F331 Major depressive disorder, recurrent, moderate: Secondary | ICD-10-CM | POA: Diagnosis not present

## 2016-04-12 DIAGNOSIS — F1021 Alcohol dependence, in remission: Secondary | ICD-10-CM

## 2016-04-12 DIAGNOSIS — F411 Generalized anxiety disorder: Secondary | ICD-10-CM

## 2016-04-12 NOTE — Progress Notes (Signed)
   THERAPIST PROGRESS NOTE  Session Time: 12:02 -1:00  Participation Level: Active  Behavioral Response: CasualAlertDepressed  Type of Therapy: Individual Therapy  Treatment Goals addressed: Improve psychiatric symptoms, Elevate mood Improve unhelpful thought patterns, decrease irrational worries and fears, relapse prevention skills, learn about diagnosis, healthy coping skills  Interventions: CBT and Motivational Interviewing, Grounding and Mindfulness Techniques, psychoeducation  Summary: Kendra Ball is a 70 y.o. female who presents with Major Depressive Disorder, recurrent, moderate and Generalized Anxiety Disorder, Alcohol use disorder, severe, in early remission, Xanax use disorder, mild, in early remission  Suicidal/Homicidal: No -without intent/plan  Therapist Response:  Pamala Hurry met with clinician for an individual session. [name] discussed her psychiatric symptoms, her current life events, and her homework. [name] shared that she was having a challenging time. She shared that this is the first Christmas time sober. Clinician asked open ended questions and [name] shared about experiencing emotions and dealing with unsettled business that she would have avoided by self medicating in the past. Client and clinician discussed relapse prevention skills. Clinician asked open ended questions and [name] shared about some of the difficult thoughts she has been experiencing. Client and clinician discussed the evidence for and against the thoughts. [name] was then able to formulate healthier alternative thoughts. Client and clinician discussed how her actions might change if she believed the healthier alternative thoughts. Client and clinician discussed healthy coping skills. Client and clinician discussed healthy sleep habits.   Plan: Return again in 1 -2 weeks.  Diagnosis: Axis I: Major Depressive Disorder, recurrent, moderate and Generalized Anxiety Disorder, Alcohol use disorder,  severe, in early remission, Xanax use disorder, mild, in early remission    Algernon Mundie A, LCSW 04/12/2016

## 2016-04-14 ENCOUNTER — Other Ambulatory Visit: Payer: Self-pay | Admitting: *Deleted

## 2016-04-14 DIAGNOSIS — E785 Hyperlipidemia, unspecified: Secondary | ICD-10-CM

## 2016-04-14 MED ORDER — SIMVASTATIN 10 MG PO TABS: 10.0000 mg | ORAL_TABLET | Freq: Every day | ORAL | 0 refills | Status: DC

## 2016-04-20 ENCOUNTER — Ambulatory Visit (INDEPENDENT_AMBULATORY_CARE_PROVIDER_SITE_OTHER): Payer: Medicare Other | Admitting: Family Medicine

## 2016-04-20 ENCOUNTER — Encounter: Payer: Self-pay | Admitting: Family Medicine

## 2016-04-20 VITALS — BP 105/72 | HR 71 | Temp 98.3°F | Resp 20 | Wt 171.5 lb

## 2016-04-20 DIAGNOSIS — J449 Chronic obstructive pulmonary disease, unspecified: Secondary | ICD-10-CM

## 2016-04-20 DIAGNOSIS — J441 Chronic obstructive pulmonary disease with (acute) exacerbation: Secondary | ICD-10-CM | POA: Diagnosis not present

## 2016-04-20 DIAGNOSIS — R062 Wheezing: Secondary | ICD-10-CM

## 2016-04-20 DIAGNOSIS — J0120 Acute ethmoidal sinusitis, unspecified: Secondary | ICD-10-CM | POA: Diagnosis not present

## 2016-04-20 DIAGNOSIS — J4489 Other specified chronic obstructive pulmonary disease: Secondary | ICD-10-CM

## 2016-04-20 MED ORDER — IPRATROPIUM-ALBUTEROL 0.5-2.5 (3) MG/3ML IN SOLN
3.0000 mL | Freq: Once | RESPIRATORY_TRACT | Status: AC
  Administered 2016-04-20: 3 mL via RESPIRATORY_TRACT

## 2016-04-20 MED ORDER — DOXYCYCLINE HYCLATE 100 MG PO TABS: 100.0000 mg | ORAL_TABLET | Freq: Two times a day (BID) | ORAL | 0 refills | Status: DC

## 2016-04-20 MED ORDER — BUDESONIDE-FORMOTEROL FUMARATE 160-4.5 MCG/ACT IN AERO: 2.0000 | INHALATION_SPRAY | Freq: Two times a day (BID) | RESPIRATORY_TRACT | 6 refills | Status: DC

## 2016-04-20 MED ORDER — PREDNISONE 50 MG PO TABS
ORAL_TABLET | ORAL | 0 refills | Status: DC
Start: 2016-04-20 — End: 2016-09-09

## 2016-04-20 MED ORDER — ALBUTEROL SULFATE HFA 108 (90 BASE) MCG/ACT IN AERS: 2.0000 | INHALATION_SPRAY | Freq: Four times a day (QID) | RESPIRATORY_TRACT | 0 refills | Status: DC | PRN

## 2016-04-20 NOTE — Progress Notes (Signed)
Kendra Ball , [birth year], 70 y.o., female MRN: [id number] Patient Care Team    Relationship Specialty Notifications Start End  Ma Hillock, DO PCP - General Family Medicine  12/30/14   Juanita Craver, MD Consulting Physician Gastroenterology  05/09/14   Excell Seltzer, MD Consulting Physician General Surgery  05/09/14   Brandon Melnick, [place]  Psychology  10/16/15     CC: URI Subjective: Pt presents for an acute OV with complaints of cough of 4 days duration.  Associated symptoms include congestion, cough, clammy feeling, teeth pain, sinus pressure.  She is a smoker. Family members have been ill. She denies nausea, vomit, abd pain. Pt has tried tylenol/motrin/dayquil/nyquil to ease their symptoms. She has been out of her inhalers for her COPD.  Allergies  Allergen Reactions  . Morphine Sulfate     Swelling around injection area   Social History  Substance Use Topics  . Smoking status: Current Every Day Smoker    Packs/day: 1.00    Years: 50.00    Types: Cigarettes  . Smokeless tobacco: Never Used  . Alcohol use Yes     Comment: quit on Aug 27, 2015   Past Medical History:  Diagnosis Date  . Anxiety   . Arthritis   . DEPRESSION 08/26/2008  . Hx of colonic polyps    First noted on  colonoscopy 2012  . Hypercholesteremia   . HYPERTENSION 08/26/2008  . HYPOTHYROIDISM 08/26/2008  . Obesity   . OSTEOPENIA 08/26/2008  . Osteoporosis   . Substance abuse    inpatient and outpatient tx for substance abuse  . TOBACCO USER 12/25/2009   Past Surgical History:  Procedure Laterality Date  . [place] SURGERY  2006  . CESAREAN SECTION    . COLON RESECTION N/A 05/09/2014   Procedure: LAPAROSCOPIC HAND-ASSISTED EXTENDED RIGHT COLECTOMY, LAPAROSCOPIC LYSIS OF ADHESIONS, SPLENIC FLEXURE MOBILIZATION.;  Surgeon: [name], MD;  Location: [facility];  Service: General;  Laterality: N/A;  . DILATION AND CURETTAGE OF UTERUS     x2  . FOREARM FRACTURE SURGERY     right  . GASTRIC BYPASS    .  TONSILLECTOMY     Family History  Problem Relation Age of Onset  . Cancer Mother     colon  . Dementia Mother   . Depression Mother   . Heart disease Father 27  . Heart disease Sister 45    stents, pacemaker  . Depression Sister   . Heart disease Brother 85    CABG, MI  . Depression Brother   . Obesity Daughter   . Suicidality Daughter   . Bipolar disorder Daughter   . Obesity Son   . Mental illness Daughter     bipolar  . Obesity Daughter   . Depression Sister   . Dementia Paternal Aunt   . Alcohol abuse Maternal Uncle   . Colon cancer Maternal Aunt   . Stomach cancer Maternal Grandfather    Allergies as of 04/20/2016      Reactions   Morphine Sulfate    Swelling around injection area      Medication List       Accurate as of 04/20/16  1:40 PM. Always use your most recent med list.          alendronate 70 MG tablet Commonly known as:  FOSAMAX Take 1 tablet (70 mg total) by mouth once a week. Reported on 10/06/2015   ALIGN 4 MG Caps Take 1 capsule (4 mg total) by  mouth daily.   aspirin 81 MG tablet Take 81 mg by mouth daily.   budesonide-formoterol 160-4.5 MCG/ACT inhaler Commonly known as:  SYMBICORT Inhale 2 puffs into the lungs 2 (two) times daily.   buPROPion 100 MG tablet Commonly known as:  WELLBUTRIN Take 1 tablet (100 mg total) by mouth 2 (two) times daily.   busPIRone 10 MG tablet Commonly known as:  BUSPAR TAKE 1 TABLET BY MOUTH 3 TIMES A DAY   ferrous fumarate 325 (106 Fe) MG Tabs tablet Commonly known as:  HEMOCYTE - 106 mg FE Take 1 tablet by mouth daily. Reported on 10/06/2015   levothyroxine 100 MCG tablet Commonly known as:  SYNTHROID, LEVOTHROID Take 1 tablet (100 mcg total) by mouth daily before breakfast.   lisinopril 10 MG tablet Commonly known as:  PRINIVIL,ZESTRIL Take 1 tablet (10 mg total) by mouth daily.   Magnesium 250 MG Tabs Take 250 mg by mouth 2 (two) times daily.   simvastatin 10 MG tablet Commonly known as:   ZOCOR Take 1 tablet (10 mg total) by mouth at bedtime.   traZODone 150 MG tablet Commonly known as:  DESYREL Take 1 tablet (150 mg total) by mouth at bedtime.   venlafaxine XR 75 MG 24 hr capsule Commonly known as:  EFFEXOR-XR Take 1 capsule (75 mg total) by mouth daily with breakfast.   VITAMIN B-12 PO Take 1 tablet by mouth daily.       No results found for this or any previous visit (from the past 24 hour(s)). No results found.   ROS: Negative, with the exception of above mentioned in HPI   Objective:  BP 105/72 (BP Location: Left Arm, Patient Position: Sitting, Cuff Size: Normal)   Pulse 71   Temp 98.3 F (36.8 C)   Resp 20   Wt 171 lb 8 oz (77.8 kg)   SpO2 96%   BMI 29.90 kg/m  Body mass index is 29.9 kg/m. Gen: Afebrile. No acute distress. Nontoxic in appearance, well developed, well nourished. Deep cough present HENT: AT. [place]. Bilateral TM visualized WNL. MMM, no oral lesions. Bilateral nares with erythema and drainage. Throat without erythema or exudates. Cough, hoarseness, TTP max sinus.  Eyes:Pupils Equal Round Reactive to light, Extraocular movements intact,  Conjunctiva without redness, discharge or icterus. Neck/lymp/endocrine: Supple,no lymphadenopathy CV: RRR  Chest: diminished air sounds bilaterally. Crackle and insp. Wheeze LLL.  Skin: no rashes, purpura or petechiae.   Assessment/Plan: Kendra Ball is a 70 y.o. female present for acute OV for  COPD (chronic obstructive pulmonary disease) with chronic bronchitis (HCC)/wheezing/sinusitis.  - new - wheezing improved with duoneb treatment  - rest/hydrate/stop smoking - doxycyline and prednisone for COPD excerbation with sinus symptoms.  - refills on Symbicort.  - prescribed albuterol rescue inhaler with instructions.  - budesonide-formoterol (SYMBICORT) 160-4.5 MCG/ACT inhaler; Inhale 2 puffs into the lungs 2 (two) times daily.  Dispense: 1 Inhaler; Refill: 6 - albuterol (PROVENTIL HFA;VENTOLIN  HFA) 108 (90 Base) MCG/ACT inhaler; Inhale 2 puffs into the lungs every 6 (six) hours as needed for wheezing or shortness of breath.  Dispense: 1 Inhaler; Refill: 0 - ipratropium-albuterol (DUONEB) 0.5-2.5 (3) MG/3ML nebulizer solution 3 mL; Take 3 mLs by nebulization once.   electronically signed by:  Howard Pouch, DO  [place]

## 2016-04-20 NOTE — Patient Instructions (Signed)
I have called doxycyline and prednisone for your infection.  I have called in refills on Symbicort.  I have called in albuterol rescue inhaler for you to use every 6-8 hours 1-2 puffs as needed during this illness. You can use it occasionally after illness if wheezing or shortness of breath. You should not need this medicine more tan 2x a week when not ill, if you do you should be seen.

## 2016-04-28 ENCOUNTER — Ambulatory Visit (HOSPITAL_COMMUNITY): Payer: Self-pay | Admitting: Clinical

## 2016-05-03 ENCOUNTER — Ambulatory Visit (HOSPITAL_COMMUNITY): Payer: Self-pay | Admitting: Psychiatry

## 2016-05-08 ENCOUNTER — Other Ambulatory Visit (HOSPITAL_COMMUNITY): Payer: Self-pay | Admitting: Psychiatry

## 2016-05-08 DIAGNOSIS — F331 Major depressive disorder, recurrent, moderate: Secondary | ICD-10-CM

## 2016-05-08 DIAGNOSIS — F411 Generalized anxiety disorder: Secondary | ICD-10-CM

## 2016-05-08 DIAGNOSIS — F1020 Alcohol dependence, uncomplicated: Secondary | ICD-10-CM

## 2016-05-11 ENCOUNTER — Ambulatory Visit (HOSPITAL_COMMUNITY): Payer: Self-pay | Admitting: Clinical

## 2016-05-25 ENCOUNTER — Ambulatory Visit (HOSPITAL_COMMUNITY): Payer: Self-pay | Admitting: Clinical

## 2016-05-25 ENCOUNTER — Ambulatory Visit (INDEPENDENT_AMBULATORY_CARE_PROVIDER_SITE_OTHER): Payer: Medicare Other

## 2016-05-25 ENCOUNTER — Other Ambulatory Visit (HOSPITAL_COMMUNITY): Payer: Self-pay | Admitting: Psychiatry

## 2016-05-25 DIAGNOSIS — Z23 Encounter for immunization: Secondary | ICD-10-CM

## 2016-05-25 DIAGNOSIS — F411 Generalized anxiety disorder: Secondary | ICD-10-CM

## 2016-05-25 DIAGNOSIS — F331 Major depressive disorder, recurrent, moderate: Secondary | ICD-10-CM

## 2016-05-29 ENCOUNTER — Other Ambulatory Visit (HOSPITAL_COMMUNITY): Payer: Self-pay | Admitting: Psychiatry

## 2016-05-29 DIAGNOSIS — F99 Mental disorder, not otherwise specified: Secondary | ICD-10-CM

## 2016-05-29 DIAGNOSIS — F1020 Alcohol dependence, uncomplicated: Secondary | ICD-10-CM

## 2016-05-29 DIAGNOSIS — F5105 Insomnia due to other mental disorder: Secondary | ICD-10-CM

## 2016-05-29 DIAGNOSIS — F331 Major depressive disorder, recurrent, moderate: Secondary | ICD-10-CM

## 2016-06-01 ENCOUNTER — Other Ambulatory Visit (HOSPITAL_COMMUNITY): Payer: Self-pay

## 2016-06-01 DIAGNOSIS — F331 Major depressive disorder, recurrent, moderate: Secondary | ICD-10-CM

## 2016-06-01 DIAGNOSIS — F1020 Alcohol dependence, uncomplicated: Secondary | ICD-10-CM

## 2016-06-01 DIAGNOSIS — F411 Generalized anxiety disorder: Secondary | ICD-10-CM

## 2016-06-01 MED ORDER — BUSPIRONE HCL 10 MG PO TABS: 10.0000 mg | ORAL_TABLET | Freq: Three times a day (TID) | ORAL | 0 refills | Status: DC

## 2016-06-23 ENCOUNTER — Ambulatory Visit (INDEPENDENT_AMBULATORY_CARE_PROVIDER_SITE_OTHER): Payer: Medicare Other | Admitting: Psychiatry

## 2016-06-23 ENCOUNTER — Encounter (HOSPITAL_COMMUNITY): Payer: Self-pay | Admitting: Psychiatry

## 2016-06-23 DIAGNOSIS — F5105 Insomnia due to other mental disorder: Secondary | ICD-10-CM | POA: Diagnosis not present

## 2016-06-23 DIAGNOSIS — F1021 Alcohol dependence, in remission: Secondary | ICD-10-CM

## 2016-06-23 DIAGNOSIS — F1020 Alcohol dependence, uncomplicated: Secondary | ICD-10-CM

## 2016-06-23 DIAGNOSIS — Z818 Family history of other mental and behavioral disorders: Secondary | ICD-10-CM | POA: Diagnosis not present

## 2016-06-23 DIAGNOSIS — Z79899 Other long term (current) drug therapy: Secondary | ICD-10-CM | POA: Diagnosis not present

## 2016-06-23 DIAGNOSIS — F17210 Nicotine dependence, cigarettes, uncomplicated: Secondary | ICD-10-CM | POA: Diagnosis not present

## 2016-06-23 DIAGNOSIS — F331 Major depressive disorder, recurrent, moderate: Secondary | ICD-10-CM

## 2016-06-23 DIAGNOSIS — Z811 Family history of alcohol abuse and dependence: Secondary | ICD-10-CM

## 2016-06-23 DIAGNOSIS — F411 Generalized anxiety disorder: Secondary | ICD-10-CM | POA: Diagnosis not present

## 2016-06-23 DIAGNOSIS — F99 Mental disorder, not otherwise specified: Secondary | ICD-10-CM | POA: Diagnosis not present

## 2016-06-23 MED ORDER — TRAZODONE HCL 150 MG PO TABS: 150.0000 mg | ORAL_TABLET | Freq: Every day | ORAL | 0 refills | Status: DC

## 2016-06-23 MED ORDER — BUPROPION HCL 100 MG PO TABS: 100.0000 mg | ORAL_TABLET | Freq: Two times a day (BID) | ORAL | 0 refills | Status: DC

## 2016-06-23 MED ORDER — VENLAFAXINE HCL ER 75 MG PO CP24: ORAL_CAPSULE | ORAL | 0 refills | Status: DC

## 2016-06-23 MED ORDER — BUSPIRONE HCL 10 MG PO TABS: 10.0000 mg | ORAL_TABLET | Freq: Three times a day (TID) | ORAL | 0 refills | Status: DC

## 2016-06-23 NOTE — Progress Notes (Signed)
Patient ID: Kendra Ball, female   DOB: [birth year], 71 y.o.   MRN: [id number] Patient ID: Kendra Ball, female   DOB: [birth year], 71 y.o.   MRN: [id number]  [facility] Progress Note  Kendra Ball [id number] 71 y.o.  06/23/2016 10:19 AM  Chief Complaint: "my life sucks right now"  History of Present Illness: reviewed information below with patient on 06/23/16 and same as previous visits except as noted  HPI States her 21yo granddaughter attempted suicide a week ago. Pt has been reaching out to her daughter and trying to help her.  Pt states the meds are helping because she didn't run and hide in bed and didn't need to drink alcohol.   States she quit drinking alcohol and using benzos on May 4th, 2017 . Pt goes to [facility] meetings about 3-4x/month and has a sponsor. Pt has started working on the 12 step program. Pt's husband drinks alcohol so he has it hidden in the house. Pt has not searched for it. She has some mental cravings but doesn't want to mess up again.    She is reading and journaling. Denies depression "right now for the first time in 30 yrs". She has occasional bad days. Pt is very hopeful.   Her anxiety is present and she feels "shaky" on the inside.  Buspar is helping a lot. It comes and goes through out the day. Pt uses a lot of coping skills from therapy.   Sleep is good with Trazodone. Pt is sleeping about 5-7 hrs/night.  Energy is good and she is no longer napping.   Taking meds as prescribed and denies SE.   Pt is in therapy and it is going well. .   Suicidal Ideation: No  Plan Formed: No Patient has means to carry out plan: No  Homicidal Ideation: No Plan Formed: No Patient has means to carry out plan: No  Review of Systems: Psychiatric: Agitation: No Hallucination: No Depressed Mood: Yes Insomnia: No Hypersomnia: No Altered Concentration: No Feels Worthless: No Grandiose Ideas: No Belief In Special Powers: No New/Increased  Substance Abuse: No Compulsions: No  Neurologic: Headache: No Seizure: No Paresthesias: No   Review of Systems  Constitutional: Negative for chills, diaphoresis and fever.  HENT: Negative for congestion, ear discharge, hearing loss, nosebleeds and sore throat.   Neurological: Negative for dizziness, tremors, sensory change, seizures, loss of consciousness and headaches.  Psychiatric/Behavioral: Negative for depression, hallucinations, substance abuse and suicidal ideas. The patient is nervous/anxious. The patient does not have insomnia.      Past Medical Family, Social History: lives in [place] with her husband. Has 3 kids. Pt retired in 2010. She worked as a Network engineer at [employer].  reports that she has been smoking Cigarettes.  She has a 50.00 pack-year smoking history. She has never used smokeless tobacco. She reports that she does not drink alcohol or use drugs.  Family History  Problem Relation Age of Onset  . Cancer Mother     colon  . Dementia Mother   . Depression Mother   . Heart disease Father 79  . Heart disease Sister 68    stents, pacemaker  . Depression Sister   . Heart disease Brother 28    CABG, MI  . Depression Brother   . Obesity Daughter   . Suicidality Daughter   . Bipolar disorder Daughter   . Obesity Son   . Mental illness Daughter     bipolar  . Obesity  Daughter   . Depression Sister   . Dementia Paternal Aunt   . Alcohol abuse Maternal Uncle   . Colon cancer Maternal Aunt   . Stomach cancer Maternal Grandfather    Past Medical History:  Diagnosis Date  . Anxiety   . Arthritis   . DEPRESSION 08/26/2008  . Hx of colonic polyps    First noted on  colonoscopy 2012  . Hypercholesteremia   . HYPERTENSION 08/26/2008  . HYPOTHYROIDISM 08/26/2008  . Obesity   . OSTEOPENIA 08/26/2008  . Osteoporosis   . Substance abuse    inpatient and outpatient tx for substance abuse  . TOBACCO USER 12/25/2009    Outpatient Encounter Prescriptions  as of 06/23/2016  Medication Sig  . albuterol (PROVENTIL HFA;VENTOLIN HFA) 108 (90 Base) MCG/ACT inhaler Inhale 2 puffs into the lungs every 6 (six) hours as needed for wheezing or shortness of breath.  [name] alendronate (FOSAMAX) 70 MG tablet Take 1 tablet (70 mg total) by mouth once a week. Reported on 10/06/2015  . aspirin 81 MG tablet Take 81 mg by mouth daily.  . budesonide-formoterol (SYMBICORT) 160-4.5 MCG/ACT inhaler Inhale 2 puffs into the lungs 2 (two) times daily.  [name] buPROPion (WELLBUTRIN) 100 MG tablet Take 1 tablet (100 mg total) by mouth 2 (two) times daily.  . busPIRone (BUSPAR) 10 MG tablet Take 1 tablet (10 mg total) by mouth 3 (three) times daily.  . Cyanocobalamin (VITAMIN B-12 PO) Take 1 tablet by mouth daily.  [name] doxycycline (VIBRA-TABS) 100 MG tablet Take 1 tablet (100 mg total) by mouth 2 (two) times daily.  . ferrous fumarate (HEMOCYTE - 106 MG FE) 325 (106 FE) MG TABS Take 1 tablet by mouth daily. Reported on 10/06/2015  . levothyroxine (SYNTHROID, LEVOTHROID) 100 MCG tablet Take 1 tablet (100 mcg total) by mouth daily before breakfast.  . lisinopril (PRINIVIL,ZESTRIL) 10 MG tablet Take 1 tablet (10 mg total) by mouth daily.  . Magnesium 250 MG TABS Take 250 mg by mouth 2 (two) times daily.   . predniSONE (DELTASONE) 50 MG tablet 1 tab QD  . Probiotic Product (ALIGN) 4 MG CAPS Take 1 capsule (4 mg total) by mouth daily.  . simvastatin (ZOCOR) 10 MG tablet Take 1 tablet (10 mg total) by mouth at bedtime.  . traZODone (DESYREL) 150 MG tablet TAKE 1 TABLET (150 MG TOTAL) BY MOUTH AT BEDTIME.  [name] venlafaxine XR (EFFEXOR-XR) 75 MG 24 hr capsule TAKE ONE CAPSULE BY MOUTH EVERY DAY WITH BREAKFAST   Facility-Administered Encounter Medications as of 06/23/2016  Medication  . 0.9 %  sodium chloride infusion    Past Psychiatric History/Hospitalization(s): Anxiety: Yes Bipolar Disorder: No Depression: Yes Mania: No Psychosis: No Schizophrenia: No Personality Disorder:  No Hospitalization for psychiatric illness: No History of Electroconvulsive Shock Therapy: No Prior Suicide Attempts: No  Physical Exam: Constitutional:  BP 110/62 (BP Location: Left Arm, Patient Position: Sitting, Cuff Size: Normal)   Pulse 63   Ht 5' 3.75" (1.619 m)   Wt 172 lb (78 kg)   BMI 29.76 kg/m   General Appearance: alert, oriented, no acute distress  Musculoskeletal: Strength & Muscle Tone: within normal limits Gait & Station: normal Patient leans: straight  Mental Status Examination/Evaluation: reviewed MSE on 06/23/16 and same as previous visits except as noted  Objective: Attitude: Calm and cooperative  Appearance: Fairly Groomed, appears to be stated age  Eye Contact::  Good  Speech:  Clear and Coherent and Normal Rate  Volume:  Normal  Mood:  euthymic  Affect:  Congruent  Thought Process:  Linear and Logical  Orientation:  Full (Time, Place, and Person)  Thought Content:  WDL  Suicidal Thoughts:  No  Homicidal Thoughts:  No  Judgement:  Fair  Insight:  Fair  Concentration: good  Memory: Immediate-good Recent-good Remote-good  Recall: fair  Language: fair  Gait and Station: normal  [employer] of Knowledge: average  Psychomotor Activity:  Tremor  Akathisia:  No  Handed:  Right  AIMS (if indicated): n/a  Assets:  Communication Skills Desire for Improvement Financial Resources/Insurance Housing Intimacy Leisure Time Resilience Social Support Talents/Skills Transportation Vocational/Educational      Assessment: MDD- recurrent, moderate. GAD. Insomnia; Alcohol use disorder-severe, in remission,  Nicotine dependence    Treatment Plan/Recommendations:  Plan of Care:  Medication management with supportive therapy. Risks/benefits and SE of the medication discussed. Pt verbalized understanding and verbal consent obtained for treatment.  Affirm with the patient that the medications are taken as ordered. Patient expressed understanding of  how their medications were to be used.     Laboratory: Reviewed labs 02/03/2015 CBC WNL  Psychotherapy: Therapy: brief supportive therapy provided. Discussed psychosocial stressors in detail.  -validated pt's continued sobriety    Medications:   Effexor XR 75mg  po qD for depression and anxiety Wellbutrin SR 100mg  po BID for depression Trazodone 150mg  po qHS for sleep Buspar 10mg  po TID for anxiety    Routine PRN Medications: Yes  Consultations: encouraged to f/up with PCP as needed, encouraged to continue individual therapy  Safety Concerns: Pt denies SI and is at an acute low risk for suicide.Patient told to call clinic if any problems occur. Patient advised to go to ER if they should develop SI/HI, side effects, or if symptoms worsen. Has crisis numbers to call if needed. Pt verbalized understanding.   Other: F/up in 3 months or sooner if needed     [name], MD 06/23/2016

## 2016-07-12 ENCOUNTER — Other Ambulatory Visit: Payer: Self-pay | Admitting: *Deleted

## 2016-07-12 DIAGNOSIS — E785 Hyperlipidemia, unspecified: Secondary | ICD-10-CM

## 2016-07-12 MED ORDER — SIMVASTATIN 10 MG PO TABS: 10.0000 mg | ORAL_TABLET | Freq: Every day | ORAL | 0 refills | Status: DC

## 2016-08-12 ENCOUNTER — Other Ambulatory Visit: Payer: Self-pay | Admitting: *Deleted

## 2016-08-12 ENCOUNTER — Encounter: Payer: Self-pay | Admitting: *Deleted

## 2016-08-12 DIAGNOSIS — I10 Essential (primary) hypertension: Secondary | ICD-10-CM

## 2016-08-12 MED ORDER — LISINOPRIL 10 MG PO TABS: 10.0000 mg | ORAL_TABLET | Freq: Every day | ORAL | 0 refills | Status: DC

## 2016-08-12 NOTE — Telephone Encounter (Signed)
Lisinopril refilled for 30 day supply . Patient need office visit prior to anymore refills . Sent patient a message in [facility] Chart.

## 2016-09-06 ENCOUNTER — Telehealth: Payer: Self-pay

## 2016-09-06 NOTE — Telephone Encounter (Signed)
Fax received from pharmacy for refill, patient notified that she will need follow up appointment for further refills. Appointment scheduled for Fri. 09/09/16.

## 2016-09-09 ENCOUNTER — Ambulatory Visit (INDEPENDENT_AMBULATORY_CARE_PROVIDER_SITE_OTHER): Payer: Medicare Other | Admitting: Family Medicine

## 2016-09-09 ENCOUNTER — Encounter: Payer: Self-pay | Admitting: Family Medicine

## 2016-09-09 VITALS — BP 111/73 | HR 66 | Temp 97.9°F | Resp 20 | Ht 64.0 in | Wt 171.8 lb

## 2016-09-09 DIAGNOSIS — I2510 Atherosclerotic heart disease of native coronary artery without angina pectoris: Secondary | ICD-10-CM | POA: Diagnosis not present

## 2016-09-09 DIAGNOSIS — Z6829 Body mass index (BMI) 29.0-29.9, adult: Secondary | ICD-10-CM

## 2016-09-09 DIAGNOSIS — M810 Age-related osteoporosis without current pathological fracture: Secondary | ICD-10-CM

## 2016-09-09 DIAGNOSIS — J449 Chronic obstructive pulmonary disease, unspecified: Secondary | ICD-10-CM

## 2016-09-09 DIAGNOSIS — E785 Hyperlipidemia, unspecified: Secondary | ICD-10-CM

## 2016-09-09 DIAGNOSIS — I10 Essential (primary) hypertension: Secondary | ICD-10-CM

## 2016-09-09 DIAGNOSIS — E039 Hypothyroidism, unspecified: Secondary | ICD-10-CM | POA: Diagnosis not present

## 2016-09-09 MED ORDER — BUDESONIDE-FORMOTEROL FUMARATE 160-4.5 MCG/ACT IN AERO: 2.0000 | INHALATION_SPRAY | Freq: Two times a day (BID) | RESPIRATORY_TRACT | 6 refills | Status: DC

## 2016-09-09 MED ORDER — LISINOPRIL 10 MG PO TABS: 10.0000 mg | ORAL_TABLET | Freq: Every day | ORAL | 1 refills | Status: DC

## 2016-09-09 MED ORDER — SIMVASTATIN 10 MG PO TABS: 10.0000 mg | ORAL_TABLET | Freq: Every day | ORAL | 3 refills | Status: DC

## 2016-09-09 MED ORDER — ALBUTEROL SULFATE HFA 108 (90 BASE) MCG/ACT IN AERS: 2.0000 | INHALATION_SPRAY | Freq: Four times a day (QID) | RESPIRATORY_TRACT | 2 refills | Status: DC | PRN

## 2016-09-09 NOTE — Addendum Note (Signed)
Addended by: Leota Jacobsen on: 09/09/2016 09:37 AM   Modules accepted: Orders

## 2016-09-09 NOTE — Patient Instructions (Signed)
Fasting labs 2 days before CPE, schedule CPE end of June--> July You look really good today!   Please help [place] help you:  We are honored you have chosen [facility] for your Primary Care home. Below you will find basic instructions that you may need to access in the future. Please help [place] help you by reading the instructions, which cover many of the frequent questions we experience.   Prescription refills and request:  -In order to allow more efficient response time, please call your pharmacy for all refills. They will forward the request electronically to [place]. This allows for the quickest possible response. Request left on a nurse line can take longer to refill, since these are checked as time allows between office patients and other phone calls.  - refill request can take up to 3-5 working days to complete.  - If request is sent electronically and request is appropiate, it is usually completed in 1-2 business days.  - all patients will need to be seen routinely for all chronic medical conditions requiring prescription medications (see follow-up below). If you are overdue for follow up on your condition, you will be asked to make an appointment and we will call in enough medication to cover you until your appointment (up to 30 days).  - all controlled substances will require a face to face visit to request/refill.  - if you desire your prescriptions to go through a new pharmacy, and have an active script at original pharmacy, you will need to call your pharmacy and have scripts transferred to new pharmacy. This is completed between the pharmacy locations and not by your provider.    Results: If any images or labs were ordered, it can take up to 1 week to get results depending on the test ordered and the lab/facility running and resulting the test. - Normal or stable results, which do not need further discussion, may be released to your mychart immediately with attached note to you. A call may  not be generated for normal results. Please make certain to sign up for mychart. If you have questions on how to activate your mychart you can call the front office.  - If your results need further discussion, our office will attempt to contact you via phone, and if unable to reach you after 2 attempts, we will release your abnormal result to your mychart with instructions.  - All results will be automatically released in mychart after 1 week.  - Your provider will provide you with explanation and instruction on all relevant material in your results. Please keep in mind, results and labs may appear confusing or abnormal to the untrained eye, but it does not mean they are actually abnormal for you personally. If you have any questions about your results that are not covered, or you desire more detailed explanation than what was provided, you should make an appointment with your provider to do so.   Our office handles many outgoing and incoming calls daily. If we have not contacted you within 1 week about your results, please check your mychart to see if there is a message first and if not, then contact our office.  In helping with this matter, you help decrease call volume, and therefore allow [place] to be able to respond to patients needs more efficiently.   Acute office visits (sick visit):  An acute visit is intended for a new problem and are scheduled in shorter time slots to allow schedule openings for patients  with new problems. This is the appropriate visit to discuss a new problem. In order to provide you with excellent quality medical care with proper time for you to explain your problem, have an exam and receive treatment with instructions, these appointments should be limited to one new problem per visit. If you experience a new problem, in which you desire to be addressed, please make an acute office visit, we save openings on the schedule to accommodate you. Please do not save your new problem for  any other type of visit, let us take care of it properly and quickly for you.   Follow up visits:  Depending on your condition(s) your provider will need to see you routinely in order to provide you with quality care and prescribe medication(s). Most chronic conditions (Example: hypertension, Diabetes, depression/anxiety... etc), require visits a couple times a year. Your provider will instruct you on proper follow up for your personal medical conditions and history. Please make certain to make follow up appointments for your condition as instructed. Failing to do so could result in lapse in your medication treatment/refills. If you request a refill, and are overdue to be seen on a condition, we will always provide you with a 30 day script (once) to allow you time to schedule.    Medicare wellness (well visit): - we have a wonderful Nurse Maudie Mercury), that will meet with you and provide you will yearly medicare wellness visits. These visits should occur yearly (can not be scheduled less than 1 calendar year apart) and cover preventive health, immunizations, advance directives and screenings you are entitled to yearly through your medicare benefits. Do not miss out on your entitled benefits, this is when medicare will pay for these benefits to be ordered for you.  These are strongly encouraged by your provider and is the appropriate type of visit to make certain you are up to date with all preventive health benefits. If you have not had your medicare wellness exam in the last 12 months, please make certain to schedule one by calling the office and schedule your medicare wellness with Maudie Mercury as soon as possible.   Yearly physical (well visit):  - Adults are recommended to be seen yearly for physicals. Check with your insurance and date of your last physical, most insurances require one calendar year between physicals. Physicals include all preventive health topics, screenings, medical exam and labs that are  appropriate for gender/age and history. You may have fasting labs needed at this visit. This is a well visit (not a sick visit), new problems should not be covered during this visit (see acute visit).  - Pediatric patients are seen more frequently when they are younger. Your provider will advise you on well child visit timing that is appropriate for your their age. - This is not a medicare wellness visit. Medicare wellness exams do not have an exam portion to the visit. Some medicare companies allow for a physical, some do not allow a yearly physical. If your medicare allows a yearly physical you can schedule the medicare wellness with our nurse Maudie Mercury and have your physical with your provider after, on the same day. Please check with insurance for your full benefits.   Late Policy/No Shows:  - all new patients should arrive 15-30 minutes earlier than appointment to allow [place] time  to  obtain all personal demographics,  insurance information and for you to complete office paperwork. - All established patients should arrive 10-15 minutes earlier than appointment time to  update all information and be checked in .  - In our best efforts to run on time, if you are late for your appointment you will be asked to either reschedule or if able, we will work you back into the schedule. There will be a wait time to work you back in the schedule,  depending on availability.  - If you are unable to make it to your appointment as scheduled, please call 24 hours ahead of time to allow [place] to fill the time slot with someone else who needs to be seen. If you do not cancel your appointment ahead of time, you may be charged a no show fee.

## 2016-09-09 NOTE — Progress Notes (Signed)
Subjective:    Patient ID: Kendra Ball, female    DOB: Oct 14, 1945, 71 y.o.   MRN: [id number] Chief Complaint  Patient presents with  . Hypertension    Hypertension    Patient presents for scheduled office visit on chronic medical conditions.  Hypertension/hyperlipidemia/BMI 29: Patient reports compliance with lisinopril 10 mg daily, Zocor 10 mg daily. She takes a daily baby aspirin. She dneies chest pain, shortness of breath or LE edema. She has lost weight slowly, since she has stopped drinking. She is 1 year Sober.   Hypothyroidism: Patient reports compliance with levothyroxine 100 g daily on an empty stomach. She denies any constipation, change in diarrhea, flushing or palpitations.  COPD: doing well. Using Symbicort as directed. Breathing well, no dyspnea. Albuterol use about once a month. H/o Pulm nodule- stable. Following yearly with low dose CT, last completed 10/13/2015. FINDINGS: Mediastinum/Nodes: Aortic and branch vessel atherosclerosis. Normal heart size, without pericardial effusion. Multivessel coronary artery atherosclerosis. No mediastinal or definite hilar adenopathy, given limitations of unenhanced CT. Lungs/Pleura: No pleural fluid. Moderate centrilobular emphysema. Calcified granulomas, including dominant superior segment left lower lobe calcified nodule. Tiny bilateral upper lobe pulmonary nodules are identified, including at volume derived equivalent diameter 3 millimeters in the right upper lobe on image 53/series 3. Mild centrilobular emphysema. IMPRESSION: 1. Lung-RADS Category 2, benign appearance or behavior. Continue annual screening with low-dose chest CT without contrast in 12 months. 2.  Atherosclerosis, including within the coronary arteries.   Past Medical History:  Diagnosis Date  . Anxiety   . Arthritis   . DEPRESSION 08/26/2008  . Hx of colonic polyps    First noted on  colonoscopy 2012  . Hypercholesteremia   . HYPERTENSION  08/26/2008  . HYPOTHYROIDISM 08/26/2008  . Obesity   . OSTEOPENIA 08/26/2008  . Osteoporosis   . Substance abuse    inpatient and outpatient tx for substance abuse  . TOBACCO USER 12/25/2009   Allergies  Allergen Reactions  . Morphine Sulfate     Swelling around injection area   Social History   Social History  . Marital status: Married    Spouse name: N/A  . Number of children: 3  . Years of education: N/A   Occupational History  . [employer]   Social History Main Topics  . Smoking status: Current Every Day Smoker    Packs/day: 1.00    Years: 50.00    Types: Cigarettes  . Smokeless tobacco: Never Used  . Alcohol use No     Comment: quit on Aug 27, 2015  . Drug use: No     Comment: last used May 2017  . Sexual activity: Yes    Partners: Male    Birth control/ protection: Post-menopausal   Other Topics Concern  . Not on file   Social History Narrative   Ms. Kearl lives in [place] with her husband. She has 3 grown children and several grand children. She has joint custody of 3 of her grand children as her daughter has bipolar disorder and has needed assistance with children in past.     Review of Systems Negative, with the exception of above mentioned in HPI     Objective:   Physical Exam BP 111/73 (BP Location: Right Arm, Patient Position: Sitting, Cuff Size: Large)   Pulse 66   Temp 97.9 F (36.6 C)   Resp 20   Ht 5\' 4"  (1.626 m)   Wt 171 lb 12 oz (77.9 kg)  SpO2 96%   BMI 29.48 kg/m  Gen: Afebrile. No acute distress. Very pleasant caucasian female. Looks good today.  HENT: AT. [email or web address].MMM.  Eyes:Pupils Equal Round Reactive to light, Extraocular movements intact,  Conjunctiva without redness, discharge or icterus. CV: RRR no murmur, no edema, +2/4 P posterior tibialis pulses Chest: CTAB, no wheeze or crackles Abd: Soft. NTND. BS present.  Skin: no rashes, purpura or petechiae.  Neuro: Normal gait. PERLA. EOMi. Alert. Oriented x3      Assessment & Plan:  Kendra Ball is a 71 y.o. female present for follow up on chronic medical  Essential hypertension/hyperlipidemia/BMI 29  - stable today. Refills on Lisinopril and zocor provided.  - Continue low-salt diet, exercise. HAs lost weight since she has stopped drinking.  - lisinopril (PRINIVIL,ZESTRIL) 10 MG tablet; Take 1 tablet (10 mg total) by mouth daily.  Dispense: 90 tablet; Refill: 1 - Cholesterol screening will be completed at CPE yearly, last completed 09/2015 - Follow-up 6 months  Hypothyroidism, unspecified hypothyroidism type - TSH normal 01/22/2016 - continue   COPD/Pulm nodule:  - doing well on Symbicort. Refills provided today.  - albuterol rarely needed. Refills provided.  - CT repeat 09/2016 - f/u 6 mos.     Electronically Signed by: Howard Pouch, DO [facility]

## 2016-09-25 ENCOUNTER — Other Ambulatory Visit (HOSPITAL_COMMUNITY): Payer: Self-pay | Admitting: Psychiatry

## 2016-09-25 DIAGNOSIS — F1020 Alcohol dependence, uncomplicated: Secondary | ICD-10-CM

## 2016-09-25 DIAGNOSIS — F411 Generalized anxiety disorder: Secondary | ICD-10-CM

## 2016-09-25 DIAGNOSIS — F5105 Insomnia due to other mental disorder: Secondary | ICD-10-CM

## 2016-09-25 DIAGNOSIS — F99 Mental disorder, not otherwise specified: Secondary | ICD-10-CM

## 2016-09-25 DIAGNOSIS — F331 Major depressive disorder, recurrent, moderate: Secondary | ICD-10-CM

## 2016-09-29 ENCOUNTER — Ambulatory Visit (INDEPENDENT_AMBULATORY_CARE_PROVIDER_SITE_OTHER): Payer: Medicare Other | Admitting: Psychiatry

## 2016-09-29 ENCOUNTER — Encounter (HOSPITAL_COMMUNITY): Payer: Self-pay | Admitting: Psychiatry

## 2016-09-29 DIAGNOSIS — F5105 Insomnia due to other mental disorder: Secondary | ICD-10-CM

## 2016-09-29 DIAGNOSIS — F17210 Nicotine dependence, cigarettes, uncomplicated: Secondary | ICD-10-CM

## 2016-09-29 DIAGNOSIS — F411 Generalized anxiety disorder: Secondary | ICD-10-CM | POA: Diagnosis not present

## 2016-09-29 DIAGNOSIS — F1020 Alcohol dependence, uncomplicated: Secondary | ICD-10-CM

## 2016-09-29 DIAGNOSIS — F331 Major depressive disorder, recurrent, moderate: Secondary | ICD-10-CM

## 2016-09-29 DIAGNOSIS — Z818 Family history of other mental and behavioral disorders: Secondary | ICD-10-CM

## 2016-09-29 DIAGNOSIS — F99 Mental disorder, not otherwise specified: Secondary | ICD-10-CM

## 2016-09-29 DIAGNOSIS — Z811 Family history of alcohol abuse and dependence: Secondary | ICD-10-CM

## 2016-09-29 DIAGNOSIS — Z810 Family history of intellectual disabilities: Secondary | ICD-10-CM

## 2016-09-29 MED ORDER — BUPROPION HCL 100 MG PO TABS: 100.0000 mg | ORAL_TABLET | Freq: Two times a day (BID) | ORAL | 0 refills | Status: DC

## 2016-09-29 MED ORDER — VENLAFAXINE HCL ER 75 MG PO CP24: ORAL_CAPSULE | ORAL | 0 refills | Status: DC

## 2016-09-29 MED ORDER — BUSPIRONE HCL 10 MG PO TABS: 10.0000 mg | ORAL_TABLET | Freq: Three times a day (TID) | ORAL | 0 refills | Status: DC

## 2016-09-29 MED ORDER — TRAZODONE HCL 150 MG PO TABS: 150.0000 mg | ORAL_TABLET | Freq: Every day | ORAL | 0 refills | Status: DC

## 2016-09-29 NOTE — Progress Notes (Signed)
[facility] MD/PA/NP OP Progress Note  09/29/2016 9:19 AM Kendra Ball  MRN:  [id number]  Chief Complaint:  Chief Complaint    Follow-up      HPI: Pt states she is doing well.  She denies any alcohol use. She got her 1 year chip in May. She goes to [employer] and meets with her sponsor once a week.  Depression is well managed. She does not think it is bad and only happens a few days a month. On those she feels overwhelmed, down and has low motivation. Pt uses coping skills she learned in group and thru AA and it helps. Pt denies hopelessness and worthlessness. She denies SI/HI.  Sleep is good with Trazodone. Energy and appetite are good.  Pt states she feels "shakey inside" and reports racing thoughts.  States she is easily overwhelmed. She treats by distracting herself. It will last for several days at a time.  Taking meds as prescribed and denies SE.    Visit Diagnosis:    ICD-10-CM   1. MDD (major depressive disorder), recurrent episode, moderate (HCC) F33.1 venlafaxine XR (EFFEXOR-XR) 75 MG 24 hr capsule    traZODone (DESYREL) 150 MG tablet    busPIRone (BUSPAR) 10 MG tablet    buPROPion (WELLBUTRIN) 100 MG tablet  2. GAD (generalized anxiety disorder) F41.1 venlafaxine XR (EFFEXOR-XR) 75 MG 24 hr capsule    busPIRone (BUSPAR) 10 MG tablet  3. Alcohol use disorder, moderate, dependence (HCC) F10.20 traZODone (DESYREL) 150 MG tablet    busPIRone (BUSPAR) 10 MG tablet  4. Insomnia due to other mental disorder F51.05 traZODone (DESYREL) 150 MG tablet   F99       Past Psychiatric History:  Anxiety: Yes Bipolar Disorder: No Depression: Yes Mania: No Psychosis: No Schizophrenia: No Personality Disorder: No Hospitalization for psychiatric illness: No History of Electroconvulsive Shock Therapy: No Prior Suicide Attempts: No  Past Medical History:  Past Medical History:  Diagnosis Date  . Anxiety   . Arthritis   . DEPRESSION 08/26/2008  . Hx of colonic polyps    First  noted on  colonoscopy 2012  . Hypercholesteremia   . HYPERTENSION 08/26/2008  . HYPOTHYROIDISM 08/26/2008  . Obesity   . OSTEOPENIA 08/26/2008  . Osteoporosis   . Substance abuse    inpatient and outpatient tx for substance abuse  . TOBACCO USER 12/25/2009    Past Surgical History:  Procedure Laterality Date  . [place] SURGERY  2006  . CESAREAN SECTION    . COLON RESECTION N/A 05/09/2014   Procedure: LAPAROSCOPIC HAND-ASSISTED EXTENDED RIGHT COLECTOMY, LAPAROSCOPIC LYSIS OF ADHESIONS, SPLENIC FLEXURE MOBILIZATION.;  Surgeon: [name], MD;  Location: [facility];  Service: General;  Laterality: N/A;  . DILATION AND CURETTAGE OF UTERUS     x2  . FOREARM FRACTURE SURGERY     right  . GASTRIC BYPASS    . TONSILLECTOMY      Family Psychiatric History: Family History  Problem Relation Age of Onset  . Cancer Mother        colon  . Dementia Mother   . Depression Mother   . Heart disease Father 38  . Heart disease Sister 23       stents, pacemaker  . Depression Sister   . Heart disease Brother 79       CABG, MI  . Depression Brother   . Obesity Daughter   . Suicidality Daughter   . Bipolar disorder Daughter   . Obesity Son   . Mental  illness Daughter        bipolar  . Obesity Daughter   . Depression Sister   . Dementia Paternal Aunt   . Alcohol abuse Maternal Uncle   . Colon cancer Maternal Aunt   . Stomach cancer Maternal Grandfather     Social History:  Social History   Social History  . Marital status: Married    Spouse name: N/A  . Number of children: 3  . Years of education: N/A   Occupational History  . [employer]   Social History Main Topics  . Smoking status: Current Every Day Smoker    Packs/day: 1.00    Years: 50.00    Types: Cigarettes  . Smokeless tobacco: Never Used  . Alcohol use No     Comment: quit on Aug 27, 2015  . Drug use: No     Comment: last used May 2017  . Sexual activity: Not Currently    Partners: Male    Birth  control/ protection: Post-menopausal   Other Topics Concern  . None   Social History Narrative   Ms. Berninger lives in [place] with her husband. She has 3 grown children and several grand children. She has joint custody of 3 of her grand children as her daughter has bipolar disorder and has needed assistance with children in past.    Allergies:  Allergies  Allergen Reactions  . Morphine Sulfate     Swelling around injection area    Metabolic Disorder Labs: Lab Results  Component Value Date   HGBA1C 5.5 05/10/2014   MPG 111 05/10/2014   No results found for: PROLACTIN Lab Results  Component Value Date   CHOL 164 10/21/2015   TRIG 94.0 10/21/2015   HDL 63.30 10/21/2015   CHOLHDL 3 10/21/2015   VLDL 18.8 10/21/2015   LDLCALC 82 10/21/2015   LDLCALC 83 09/10/2014     Current Medications: Current Outpatient Prescriptions  Medication Sig Dispense Refill  . albuterol (PROVENTIL HFA;VENTOLIN HFA) 108 (90 Base) MCG/ACT inhaler Inhale 2 puffs into the lungs every 6 (six) hours as needed for wheezing or shortness of breath. 1 Inhaler 2  . alendronate (FOSAMAX) 70 MG tablet Take 1 tablet (70 mg total) by mouth once a week. Reported on 10/06/2015 4 tablet 5  . aspirin 81 MG tablet Take 81 mg by mouth daily.    . budesonide-formoterol (SYMBICORT) 160-4.5 MCG/ACT inhaler Inhale 2 puffs into the lungs 2 (two) times daily. 1 Inhaler 6  . buPROPion (WELLBUTRIN) 100 MG tablet Take 1 tablet (100 mg total) by mouth 2 (two) times daily. 180 tablet 0  . busPIRone (BUSPAR) 10 MG tablet Take 1 tablet (10 mg total) by mouth 3 (three) times daily. 270 tablet 0  . Cyanocobalamin (VITAMIN B-12 PO) Take 1 tablet by mouth daily.    . ferrous fumarate (HEMOCYTE - 106 MG FE) 325 (106 FE) MG TABS Take 1 tablet by mouth daily. Reported on 10/06/2015    . levothyroxine (SYNTHROID, LEVOTHROID) 100 MCG tablet Take 1 tablet (100 mcg total) by mouth daily before breakfast. 90 tablet 3  . lisinopril  (PRINIVIL,ZESTRIL) 10 MG tablet Take 1 tablet (10 mg total) by mouth daily. 90 tablet 1  . Magnesium 250 MG TABS Take 250 mg by mouth 2 (two) times daily.     . simvastatin (ZOCOR) 10 MG tablet Take 1 tablet (10 mg total) by mouth at bedtime. 90 tablet 3  . traZODone (DESYREL) 150 MG tablet Take 1 tablet (150  mg total) by mouth at bedtime. 90 tablet 0  . venlafaxine XR (EFFEXOR-XR) 75 MG 24 hr capsule TAKE ONE CAPSULE BY MOUTH EVERY DAY WITH BREAKFAST 90 capsule 0  . Probiotic Product (ALIGN) 4 MG CAPS Take 1 capsule (4 mg total) by mouth daily. (Patient not taking: Reported on 09/29/2016) 30 capsule 1   No current facility-administered medications for this visit.     Musculoskeletal: Strength & Muscle Tone: within normal limits Gait & Station: normal Patient leans: N/A  Psychiatric Specialty Exam: Review of Systems  Musculoskeletal: Positive for joint pain. Negative for back pain and neck pain.  Neurological: Positive for dizziness. Negative for tingling, tremors and headaches.  Psychiatric/Behavioral: Negative for depression, hallucinations, substance abuse and suicidal ideas. The patient has insomnia. The patient is not nervous/anxious.     Blood pressure 104/66, pulse 70, height 5' 3.25" (1.607 m), weight 169 lb (76.7 kg).Body mass index is 29.7 kg/m.  General Appearance: Fairly Groomed  Eye Contact:  Good  Speech:  Clear and Coherent and Normal Rate  Volume:  Normal  Mood:  Euthymic  Affect:  Full Range  Thought Process:  Goal Directed and Descriptions of Associations: Intact  Orientation:  Full (Time, Place, and Person)  Thought Content: Logical   Suicidal Thoughts:  No  Homicidal Thoughts:  No  Memory:  Immediate;   Good Recent;   Good Remote;   Good  Judgement:  Good  Insight:  Good  Psychomotor Activity:  Normal  Concentration:  Concentration: Good and Attention Span: Good  Recall:  Good  Fund of Knowledge: Good  Language: Good  Akathisia:  No  Handed:  Right   AIMS (if indicated):  n/a  Assets:  Communication Skills Desire for Improvement Housing Resilience Social Support  ADL's:  Intact  Cognition: WNL  Sleep:  good     Treatment Plan Summary:Medication management   Assessment: MDD-recurrent, moderate; GAD; Insomnia; Alcohol use disorder,severe- in remission; Nicotine dep   Medication management with supportive therapy. Risks/benefits and SE of the medication discussed. Pt verbalized understanding and verbal consent obtained for treatment.  Affirm with the patient that the medications are taken as ordered. Patient expressed understanding of how their medications were to be used.   Meds: Effexor XR 75mg  po qD for depression and anxiety Wellbutrin SR 100mg  po BID for depression Trazodone 150mg  po qHS for sleep Buspar 10mg  po TID for anxiety   Labs: none  Therapy: brief supportive therapy provided. Discussed psychosocial stressors in detail.     Consultations: individual therapy  Pt denies SI and is at an acute low risk for suicide. Patient told to call clinic if any problems occur. Patient advised to go to ER if they should develop SI/HI, side effects, or if symptoms worsen. Has crisis numbers to call if needed. Pt verbalized understanding.  F/up in 3 months or sooner if needed    [name], MD 09/29/2016, 9:19 AM

## 2016-10-04 ENCOUNTER — Encounter: Payer: Self-pay | Admitting: Family Medicine

## 2016-10-04 ENCOUNTER — Telehealth: Payer: Self-pay | Admitting: Family Medicine

## 2016-10-04 ENCOUNTER — Encounter: Payer: Self-pay | Admitting: *Deleted

## 2016-10-04 ENCOUNTER — Ambulatory Visit (INDEPENDENT_AMBULATORY_CARE_PROVIDER_SITE_OTHER): Payer: Medicare Other | Admitting: Family Medicine

## 2016-10-04 VITALS — BP 120/72 | HR 61 | Temp 97.8°F | Resp 20 | Wt 169.8 lb

## 2016-10-04 DIAGNOSIS — F411 Generalized anxiety disorder: Secondary | ICD-10-CM | POA: Diagnosis not present

## 2016-10-04 DIAGNOSIS — T65224A Toxic effect of tobacco cigarettes, undetermined, initial encounter: Secondary | ICD-10-CM | POA: Diagnosis not present

## 2016-10-04 DIAGNOSIS — J440 Chronic obstructive pulmonary disease with acute lower respiratory infection: Secondary | ICD-10-CM | POA: Diagnosis not present

## 2016-10-04 DIAGNOSIS — Z716 Tobacco abuse counseling: Secondary | ICD-10-CM

## 2016-10-04 DIAGNOSIS — F331 Major depressive disorder, recurrent, moderate: Secondary | ICD-10-CM | POA: Diagnosis not present

## 2016-10-04 DIAGNOSIS — Z720 Tobacco use: Secondary | ICD-10-CM

## 2016-10-04 DIAGNOSIS — J209 Acute bronchitis, unspecified: Secondary | ICD-10-CM | POA: Diagnosis not present

## 2016-10-04 MED ORDER — BUPROPION HCL ER (SR) 150 MG PO TB12: 150.0000 mg | ORAL_TABLET | Freq: Two times a day (BID) | ORAL | 0 refills | Status: DC

## 2016-10-04 MED ORDER — NICOTINE 14 MG/24HR TD PT24: 14.0000 mg | MEDICATED_PATCH | Freq: Every day | TRANSDERMAL | 0 refills | Status: DC

## 2016-10-04 MED ORDER — AZITHROMYCIN 250 MG PO TABS: ORAL_TABLET | ORAL | 0 refills | Status: DC

## 2016-10-04 NOTE — Progress Notes (Signed)
Kendra Ball , January 11, 1946, 71 y.o., female MRN: [id number] Patient Care Team    Relationship Specialty Notifications Start End  Ma Hillock, DO PCP - General Family Medicine  12/30/14   Juanita Craver, MD Consulting Physician Gastroenterology  05/09/14   Excell Seltzer, MD Consulting Physician General Surgery  05/09/14   Brandon Melnick, [place]  Psychology  10/16/15     Chief Complaint  Patient presents with  . URI    cough,congestion x 10 days     Subjective: Pt presents for an OV with complaints of cough and congestion of 10 days duration.  Associated symptoms include fatigue, increased phlegm, headache and chills. She reports she is tolerating PO, but has a decreased appetite. Pt is an everyday smoker. She is using her Symbicort inhaler correctly, she has not needed her albuterol inhaler. She denies wheezing or shortness of breath.   Smoking cessation/everyday tobacco use : pt reports she knows her increase in bronchitis episodes is secondary to her smoking. She wants to try to stop smoking again. She tearfully states she has tried so many times. She smokes a pakc of menthol 100's cigarettes daily. Since being sick she cut back to 1/2 pack. She has smoked for 50 years. She is established with pulmonology and has her CT screening scheduled for 11/07/2016. In the past she has tried chantix. She states it was alright, but did increase temperament. She denies other side effects to medication. She also has tried nicotine patches and gum, never together without success. She is currently prescribed Wellbutrin, buspar and effexor for her depression/anxiety through her psychiatrist.  Depression screen [facility] 2/9 10/16/2015 09/10/2014 08/13/2013  Decreased Interest 0 0 3  Down, Depressed, Hopeless 0 0 3  PHQ - 2 Score 0 0 6  Some encounter information is confidential and restricted. Go to Review Flowsheets activity to see all data.    Allergies  Allergen Reactions  . Morphine Sulfate     Swelling  around injection area   Social History  Substance Use Topics  . Smoking status: Current Every Day Smoker    Packs/day: 1.00    Years: 50.00    Types: Cigarettes  . Smokeless tobacco: Never Used  . Alcohol use No     Comment: quit on Aug 27, 2015   Past Medical History:  Diagnosis Date  . Anxiety   . Arthritis   . DEPRESSION 08/26/2008  . Hx of colonic polyps    First noted on  colonoscopy 2012  . Hypercholesteremia   . HYPERTENSION 08/26/2008  . HYPOTHYROIDISM 08/26/2008  . Obesity   . OSTEOPENIA 08/26/2008  . Osteoporosis   . Substance abuse    inpatient and outpatient tx for substance abuse  . TOBACCO USER 12/25/2009   Past Surgical History:  Procedure Laterality Date  . [place] SURGERY  2006  . CESAREAN SECTION    . COLON RESECTION N/A 05/09/2014   Procedure: LAPAROSCOPIC HAND-ASSISTED EXTENDED RIGHT COLECTOMY, LAPAROSCOPIC LYSIS OF ADHESIONS, SPLENIC FLEXURE MOBILIZATION.;  Surgeon: [name], MD;  Location: [facility];  Service: General;  Laterality: N/A;  . DILATION AND CURETTAGE OF UTERUS     x2  . FOREARM FRACTURE SURGERY     right  . GASTRIC BYPASS    . TONSILLECTOMY     Family History  Problem Relation Age of Onset  . Cancer Mother        colon  . Dementia Mother   . Depression Mother   . Heart disease Father 73  .  Heart disease Sister 74       stents, pacemaker  . Depression Sister   . Heart disease Brother 58       CABG, MI  . Depression Brother   . Obesity Daughter   . Suicidality Daughter   . Bipolar disorder Daughter   . Obesity Son   . Mental illness Daughter        bipolar  . Obesity Daughter   . Depression Sister   . Dementia Paternal Aunt   . Alcohol abuse Maternal Uncle   . Colon cancer Maternal Aunt   . Stomach cancer Maternal Grandfather    Allergies as of 10/04/2016      Reactions   Morphine Sulfate    Swelling around injection area      Medication List       Accurate as of 10/04/16 11:37 AM. Always use your most recent med list.            albuterol 108 (90 Base) MCG/ACT inhaler Commonly known as:  PROVENTIL HFA;VENTOLIN HFA Inhale 2 puffs into the lungs every 6 (six) hours as needed for wheezing or shortness of breath.   alendronate 70 MG tablet Commonly known as:  FOSAMAX Take 1 tablet (70 mg total) by mouth once a week. Reported on 10/06/2015   ALIGN 4 MG Caps Take 1 capsule (4 mg total) by mouth daily.   aspirin 81 MG tablet Take 81 mg by mouth daily.   azithromycin 250 MG tablet Commonly known as:  ZITHROMAX Z-PAK 500 mg day 1, then 250 mg daily until finished   budesonide-formoterol 160-4.5 MCG/ACT inhaler Commonly known as:  SYMBICORT Inhale 2 puffs into the lungs 2 (two) times daily.   buPROPion 100 MG tablet Commonly known as:  WELLBUTRIN Take 1 tablet (100 mg total) by mouth 2 (two) times daily.   busPIRone 10 MG tablet Commonly known as:  BUSPAR Take 1 tablet (10 mg total) by mouth 3 (three) times daily.   ferrous fumarate 325 (106 Fe) MG Tabs tablet Commonly known as:  HEMOCYTE - 106 mg FE Take 1 tablet by mouth daily. Reported on 10/06/2015   levothyroxine 100 MCG tablet Commonly known as:  SYNTHROID, LEVOTHROID Take 1 tablet (100 mcg total) by mouth daily before breakfast.   lisinopril 10 MG tablet Commonly known as:  PRINIVIL,ZESTRIL Take 1 tablet (10 mg total) by mouth daily.   Magnesium 250 MG Tabs Take 250 mg by mouth 2 (two) times daily.   simvastatin 10 MG tablet Commonly known as:  ZOCOR Take 1 tablet (10 mg total) by mouth at bedtime.   traZODone 150 MG tablet Commonly known as:  DESYREL Take 1 tablet (150 mg total) by mouth at bedtime.   venlafaxine XR 75 MG 24 hr capsule Commonly known as:  EFFEXOR-XR TAKE ONE CAPSULE BY MOUTH EVERY DAY WITH BREAKFAST   VITAMIN B-12 PO Take 1 tablet by mouth daily.       All past medical history, surgical history, allergies, family history, immunizations andmedications were updated in the EMR today and reviewed under the  history and medication portions of their EMR.     ROS: Negative, with the exception of above mentioned in HPI   Objective:  BP 120/72 (BP Location: Left Arm, Patient Position: Sitting, Cuff Size: Normal)   Pulse 61   Temp 97.8 F (36.6 C)   Resp 20   Wt 169 lb 12 oz (77 kg)   SpO2 96%   BMI 29.83 kg/m  Body mass index is 29.83 kg/m. Gen: Afebrile. No acute distress. Nontoxic in appearance, well developed, well nourished. Very pleasant caucasian female.  HENT: AT. [place]. Bilateral TM visualized with erythema, mild fullness. MMM, no oral lesions. Bilateral nares with drainage, no erythema or swelling. Throat without erythema or exudates. Mild PND present, cough present.  Eyes:Pupils Equal Round Reactive to light, Extraocular movements intact,  Conjunctiva without redness, discharge or icterus. Neck/lymp/endocrine: Supple,no lymphadenopathy CV: RRR, no edema Chest: CTAB, no wheeze or crackles. Good air movement, normal resp effort.  Abd: Soft. NTND. BS presnet.   Neuro:  Normal gait. PERLA. EOMi. Alert. Oriented x3  Psych: watery eyed when discussing how long she has been smoking. Normal affect, dress and demeanor. Normal speech. Normal thought content and judgment.  No exam data present No results found. No results found for this or any previous visit (from the past 24 hour(s)).  Assessment/Plan: LANE KJOS is a 71 y.o. female present for OV for  Acute bronchitis with COPD ([place]) - acute bronchitis with COPD. Pt is finally ready to quit smoking. - Z-pack, mucinex, nasal saline, rest, hydrate. - albuterol PRN.  F/U PRN,  Or sooner if not improving in 1 week.   Encounter for smoking cessation counseling Toxic effect of tobacco cigarette, undetermined intent, initial encounter Patient was asked about smoking history. They were advised  to quit smoking in a clear, strong and personalized manner. Their willingness to quit smoking was assessed and felt to be in  preparation  phase. They were offered assistance in cessation and options were explained to them today.  Patient decided to try oral medication, plus nicotine patches and nicotine gum for strong cravings.  - given she is on bupropion for her depression, would want to use this medication at mildly increased dosage from her current bupropion 100 mg BID to Bupropion SR 150 BID for smoking cessation. Would avoid chantix with other SSRI she is currently using and her h/o of increased anger/temper on that medication. Pt aware not to use both bupropion scripts at the same time. Use of the Bupropion SR 150 mg BID for 12 weeks during cessation and then return to normal dose after for her Depression.  > 10 minutes were spent today in counseling. Instructing on proper technique: picking a quit date, starting medicine if appropriate, throw out cigarettes the night prior to quit date, place patch on before bed that night. Nicotine gum placed at known trigger locations for easy access. Behavior modifications (knitting, painting, fidget spinner etc) Pt advised to follow up 4 weeks after starting medicine, course of 12 weeks recommended.   Depression, Major ([place]): - pt follows with psychiatry for her depression/anxiety. Discussed the medications she is on for her depression, may limit the medications we can try for her smoking cessation.   Reviewed expectations re: course of current medical issues.  Discussed self-management of symptoms.  Outlined signs and symptoms indicating need for more acute intervention.  Patient verbalized understanding and all questions were answered.  Patient received an After-Visit Summary.   Note is dictated utilizing voice recognition software. Although note has been proof read prior to signing, occasional typographical errors still can be missed. If any questions arise, please do not hesitate to call for verification.   electronically signed by:  Howard Pouch, DO  [place]

## 2016-10-04 NOTE — Telephone Encounter (Signed)
Please call pt: - I have called in a new dose of Wellbutrin for her 150 mg SR every 12 hours (this is the dose for smoking cessation). This is to replace her current script for Wellbutrin given to her by her psychiatrist. She will use this dosage/SR for her help with her smoking cessation for 12 weeks, then she will return to her prior script of Bupropion 100 mg BID after completing 12 weeks.  DO NOT TAKE BOTH SCRIPS, one replaces the other for 12 weeks.   I have called in the nicotine patches as well.  Start higher dose med  1-2 week prior to quit date, then follow the instructions I gave her today on starting patches etc.   Follow up in 4 weeks after starting patches/quit date to monitor progress and taper meds/patches.

## 2016-10-04 NOTE — Telephone Encounter (Signed)
Spoke with patient reviewed information and instructions. Patient verbalized understanding. Sent instructions in MyChart also.

## 2016-10-04 NOTE — Patient Instructions (Addendum)
1. mucinex plain, nasal saline daily. Rest and hydrate.  2. Z-pack start today. 3. Pick a quit date, about 2 weeks away to stop smoking. Try your hardest not to increase smoking, stay at the 1/2 pack or lower. 4. I will call in nicotine patches for you to start the night before quit date.  5. Night before throw all cigarettes in the trash.  6. Use nicotine gum as we discussed when having a craving and keep around for use at known trigger locations (by phone, car etc).  7. I will call you tomorrow to advise on medication that would safe with your other meds.

## 2016-11-07 ENCOUNTER — Ambulatory Visit
Admission: RE | Admit: 2016-11-07 | Discharge: 2016-11-07 | Disposition: A | Discharge status: Home / Self Care | Payer: Medicare Other | Source: Ambulatory Visit | Attending: Acute Care | Admitting: Acute Care

## 2016-11-07 DIAGNOSIS — F17210 Nicotine dependence, cigarettes, uncomplicated: Secondary | ICD-10-CM

## 2016-11-14 ENCOUNTER — Other Ambulatory Visit: Payer: Self-pay | Admitting: Acute Care

## 2016-11-14 DIAGNOSIS — F17210 Nicotine dependence, cigarettes, uncomplicated: Secondary | ICD-10-CM

## 2016-12-06 ENCOUNTER — Other Ambulatory Visit (INDEPENDENT_AMBULATORY_CARE_PROVIDER_SITE_OTHER): Payer: Medicare Other

## 2016-12-06 DIAGNOSIS — Z6829 Body mass index (BMI) 29.0-29.9, adult: Secondary | ICD-10-CM

## 2016-12-06 DIAGNOSIS — E785 Hyperlipidemia, unspecified: Secondary | ICD-10-CM | POA: Diagnosis not present

## 2016-12-06 DIAGNOSIS — I10 Essential (primary) hypertension: Secondary | ICD-10-CM | POA: Diagnosis not present

## 2016-12-06 DIAGNOSIS — E039 Hypothyroidism, unspecified: Secondary | ICD-10-CM

## 2016-12-06 DIAGNOSIS — M810 Age-related osteoporosis without current pathological fracture: Secondary | ICD-10-CM | POA: Diagnosis not present

## 2016-12-06 LAB — COMPREHENSIVE METABOLIC PANEL
ALT: 12 U/L (ref 0–35)
AST: 15 U/L (ref 0–37)
Albumin: 4.2 g/dL (ref 3.5–5.2)
Alkaline Phosphatase: 58 U/L (ref 39–117)
BUN: 10 mg/dL (ref 6–23)
CO2: 32 mEq/L (ref 19–32)
Calcium: 9.5 mg/dL (ref 8.4–10.5)
Chloride: 103 mEq/L (ref 96–112)
Creatinine, Ser: 0.74 mg/dL (ref 0.40–1.20)
GFR: 82.34 mL/min (ref >60.00)
Glucose, Bld: 90 mg/dL (ref 70–99)
Potassium: 4.6 mEq/L (ref 3.5–5.1)
Sodium: 141 mEq/L (ref 135–145)
Total Bilirubin: 0.6 mg/dL (ref 0.2–1.2)
Total Protein: 6 g/dL (ref 6.0–8.3)

## 2016-12-06 LAB — HEMOGLOBIN A1C: Hgb A1c MFr Bld: 5.2 % (ref 4.6–6.5)

## 2016-12-06 LAB — LIPID PANEL
Cholesterol: 164 mg/dL (ref 0–200)
HDL: 70.5 mg/dL (ref >39.00)
LDL Cholesterol: 74 mg/dL (ref 0–99)
NonHDL: 93.3
Total CHOL/HDL Ratio: 2
Triglycerides: 95 mg/dL (ref 0.0–149.0)
VLDL: 19 mg/dL (ref 0.0–40.0)

## 2016-12-06 LAB — VITAMIN D 25 HYDROXY (VIT D DEFICIENCY, FRACTURES): VITD: 43.03 ng/mL (ref 30.00–100.00)

## 2016-12-06 LAB — CBC WITH DIFFERENTIAL/PLATELET
Basophils Absolute: 0.1 10*3/uL (ref 0.0–0.1)
Basophils Relative: 1.3 % (ref 0.0–3.0)
Eosinophils Absolute: 0.1 10*3/uL (ref 0.0–0.7)
Eosinophils Relative: 1.1 % (ref 0.0–5.0)
HCT: 44.5 % (ref 36.0–46.0)
Hemoglobin: 14.7 g/dL (ref 12.0–15.0)
Lymphocytes Relative: 21.6 % (ref 12.0–46.0)
Lymphs Abs: 1.4 10*3/uL (ref 0.7–4.0)
MCHC: 33 g/dL (ref 30.0–36.0)
MCV: 98 fl (ref 78.0–100.0)
Monocytes Absolute: 0.5 10*3/uL (ref 0.1–1.0)
Monocytes Relative: 7.6 % (ref 3.0–12.0)
Neutro Abs: 4.4 10*3/uL (ref 1.4–7.7)
Neutrophils Relative %: 68.4 % (ref 43.0–77.0)
Platelets: 282 10*3/uL (ref 150.0–400.0)
RBC: 4.54 Mil/uL (ref 3.87–5.11)
RDW: 13 % (ref 11.5–15.5)
WBC: 6.4 10*3/uL (ref 4.0–10.5)

## 2016-12-06 LAB — TSH: TSH: 0.65 u[IU]/mL (ref 0.35–4.50)

## 2016-12-09 NOTE — Progress Notes (Addendum)
Subjective:   Kendra Ball is a 71 y.o. female who presents for Medicare Annual (Subsequent) preventive examination.  Review of Systems:  No ROS.  Medicare Wellness Visit. Additional risk factors are reflected in the social history.  Cardiac Risk Factors include: advanced age (>83mn, >>17women);hypertension;dyslipidemia;smoking/ tobacco exposure;sedentary lifestyle;family history of premature cardiovascular disease   Sleep patterns: Takes Trazadone nightly for sleep aid.  Home Safety/Smoke Alarms: Feels safe in home. Smoke alarms in place.  Living environment; residence and Firearm Safety: Lives with husband in 1 story home.  Seat Belt Safety/Bike Helmet: Wears seat belt.   Female:   [id number]      Mammo-10/07/2014, Negative. Order placed.     Dexa scan-10/07/2014, Osteoporosis. Order placed.     CCS-Colonoscopy 01/21/2016, colitis.      Objective:     Vitals: BP 138/68 (BP Location: Left Arm, Patient Position: Sitting, Cuff Size: Normal)   Pulse 76   Temp 97.7 F (36.5 C) (Oral)   Resp 18   Ht _0  (1.6 m)   Wt 170 lb 6.4 oz (77.3 kg)   SpO2 95%   BMI 30.19 kg/m   Body mass index is 30.19 kg/m.   Tobacco History  Smoking Status  . Current Every Day Smoker  . Packs/day: 1.00  . Years: 50.00  . Types: Cigarettes  Smokeless Tobacco  . Never Used     Ready to quit: Yes Counseling given: Yes   Past Medical History:  Diagnosis Date  . Anxiety   . Arthritis   . DEPRESSION 08/26/2008  . Hx of colonic polyps    First noted on  colonoscopy 2012  . Hypercholesteremia   . HYPERTENSION 08/26/2008  . HYPOTHYROIDISM 08/26/2008  . Obesity   . OSTEOPENIA 08/26/2008  . Osteoporosis   . Substance abuse    inpatient and outpatient tx for substance abuse  . TOBACCO USER 12/25/2009   Past Surgical History:  Procedure Laterality Date  . [facility]  2006  . CESAREAN SECTION    . COLON RESECTION N/A 05/09/2014   Procedure: LAPAROSCOPIC HAND-ASSISTED EXTENDED  RIGHT COLECTOMY, LAPAROSCOPIC LYSIS OF ADHESIONS, SPLENIC FLEXURE MOBILIZATION.;  Surgeon: SMichael Boston MD;  Location: [facility];  Service: General;  Laterality: N/A;  . DILATION AND CURETTAGE OF UTERUS     x2  . FOREARM FRACTURE SURGERY     right  . GASTRIC BYPASS    . TONSILLECTOMY     Family History  Problem Relation Age of Onset  . Cancer Mother        colon  . Dementia Mother   . Depression Mother   . Heart disease Father 375 . Heart disease Sister 481      stents, pacemaker  . Depression Sister   . Heart disease Brother 568      CABG, MI  . Depression Brother   . Obesity Daughter   . Suicidality Daughter   . Bipolar disorder Daughter   . Obesity Son   . Mental illness Daughter        bipolar  . Obesity Daughter   . Depression Sister   . Dementia Paternal Aunt   . Alcohol abuse Maternal Uncle   . Colon cancer Maternal Aunt   . Stomach cancer Maternal Grandfather    History  Sexual Activity  . Sexual activity: Not Currently  . Partners: Male  . Birth control/ protection: Post-menopausal    Outpatient Encounter Prescriptions as of 12/12/2016  Medication Sig  .  albuterol (PROVENTIL HFA;VENTOLIN HFA) 108 (90 Base) MCG/ACT inhaler Inhale 2 puffs into the lungs every 6 (six) hours as needed for wheezing or shortness of breath.  [name] alendronate (FOSAMAX) 70 MG tablet Take 1 tablet (70 mg total) by mouth once a week. Reported on 10/06/2015  . aspirin 81 MG tablet Take 81 mg by mouth daily.  . budesonide-formoterol (SYMBICORT) 160-4.5 MCG/ACT inhaler Inhale 2 puffs into the lungs 2 (two) times daily.  [name] buPROPion (WELLBUTRIN SR) 150 MG 12 hr tablet Take 1 tablet (150 mg total) by mouth 2 (two) times daily.  . busPIRone (BUSPAR) 10 MG tablet Take 1 tablet (10 mg total) by mouth 3 (three) times daily.  . Cyanocobalamin (VITAMIN B-12 PO) Take 1 tablet by mouth daily.  . ferrous fumarate (HEMOCYTE - 106 MG FE) 325 (106 FE) MG TABS Take 1 tablet by mouth daily. Reported on  10/06/2015  . levothyroxine (SYNTHROID, LEVOTHROID) 100 MCG tablet Take 1 tablet (100 mcg total) by mouth daily before breakfast.  . lisinopril (PRINIVIL,ZESTRIL) 10 MG tablet Take 1 tablet (10 mg total) by mouth daily.  . Magnesium 250 MG TABS Take 250 mg by mouth 2 (two) times daily.   . simvastatin (ZOCOR) 10 MG tablet Take 1 tablet (10 mg total) by mouth at bedtime.  . traZODone (DESYREL) 150 MG tablet Take 1 tablet (150 mg total) by mouth at bedtime.  [name] venlafaxine XR (EFFEXOR-XR) 75 MG 24 hr capsule TAKE ONE CAPSULE BY MOUTH EVERY DAY WITH BREAKFAST  . nicotine (NICODERM CQ - DOSED IN MG/24 HOURS) 14 mg/24hr patch Place 1 patch (14 mg total) onto the skin daily. (Patient not taking: Reported on 12/12/2016)  . Probiotic Product (ALIGN) 4 MG CAPS Take 1 capsule (4 mg total) by mouth daily. (Patient not taking: Reported on 12/12/2016)  . Zoster Vac Recomb Adjuvanted [facility]) injection Inject 0.5 mLs into the muscle once.  . [DISCONTINUED] azithromycin (ZITHROMAX Z-PAK) 250 MG tablet 500 mg day 1, then 250 mg daily until finished   No facility-administered encounter medications on file as of 12/12/2016.     Activities of Daily Living In your present state of health, do you have any difficulty performing the following activities: 12/12/2016  Hearing? N  Vision? N  Difficulty concentrating or making decisions? N  Walking or climbing stairs? N  Dressing or bathing? N  Doing errands, shopping? N  Preparing Food and eating ? N  Using the Toilet? N  In the past six months, have you accidently leaked urine? N  Do you have problems with loss of bowel control? N  Managing your Medications? N  Managing your Finances? N  Housekeeping or managing your Housekeeping? N  Some recent data might be hidden    Patient Care Team: Ma Hillock, DO as PCP - General (Family Medicine) Juanita Craver, MD as Consulting Physician (Gastroenterology) Excell Seltzer, MD as Consulting Physician (General  Surgery) Brandon Melnick, LCAS (Psychology)    Assessment:    Physical assessment deferred to PCP.  Exercise Activities and Dietary recommendations Current Exercise Habits: The patient does not participate in regular exercise at present (Maintain household), Exercise limited by: None identified   Diet (meal preparation, eat out, water intake, caffeinated beverages, dairy products, fruits and vegetables): Drinks water, tea and coffee.  Breakfast: cheese sticks (2), un-sweet tea, coffee Lunch: Cheese sticks (2), mac & cheese; protein frozen dinner Dinner: protein, vegetables, rolls  Snacks on sweets and fruits.      Discussed heart healthy diet and  increasing activity.  S/P gastric bypass, discussed protein intake.   Goals    . Quit smoking / using tobacco          Quit smoking.     . Reduce alcohol intake to 0 servings per day    . Regularly attend Narcotics Anonymous or Alcoholics Anonymous meetings      Fall Risk Fall Risk  12/12/2016 10/16/2015 09/10/2014 08/13/2013  Falls in the past year? No No No No  Risk for fall due to : - Impaired balance/gait - -   Depression Screen PHQ 2/9 Scores 12/12/2016 10/16/2015 09/10/2014 08/13/2013  PHQ - 2 Score 2 0 0 6  PHQ- 9 Score 6 - - -  Some encounter information is confidential and restricted. Go to Review Flowsheets activity to see all data.    Patient states she is overwhelmed often with daughter and grandchildren. Currently sees psychiatry and attends AA.   Cognitive Function       Ad8 score reviewed for issues:  Issues making decisions: no  Less interest in hobbies / activities: no  Repeats questions, stories (family complaining): no  Trouble using ordinary gadgets (microwave, computer, phone): no  Forgets the month or year: no  Mismanaging finances: no  Remembering appts: no  Daily problems with thinking and/or memory: no Ad8 score is=0     Immunization History  Administered Date(s) Administered  . Influenza  Whole 05/07/2010  . Influenza, High Dose Seasonal PF 05/25/2016  . Influenza,inj,Quad PF,36+ Mos 03/13/2014, 12/30/2014  . PPD Test 09/20/2013  . Pneumococcal Conjugate-13 09/10/2014  . Pneumococcal Polysaccharide-23 08/13/2013  . Tdap 08/08/2005   Screening Tests Health Maintenance  Topic Date Due  . TETANUS/TDAP  08/09/2015  . MAMMOGRAM  10/06/2016  . INFLUENZA VACCINE  11/23/2016  . DEXA SCAN  10/06/2017  . COLONOSCOPY  01/20/2021  . Hepatitis C Screening  Completed  . PNA vac Low Risk Adult  Completed   Mammo and DEXA ordered.  Shingrix Rx sent to pharmacy.      Plan:     Make eye appointment.   Schedule mammogram and bone scan.   Shingles vaccine at pharmacy  Bring a copy of your living will and/or healthcare power of attorney to your next office visit.  Continue doing brain stimulating activities (puzzles, reading, adult coloring books, staying active) to keep memory sharp.    I have personally reviewed and noted the following in the patient's chart:   . Medical and social history . Use of alcohol, tobacco or illicit drugs  . Current medications and supplements . Functional ability and status . Nutritional status . Physical activity . Advanced directives . List of other physicians . Hospitalizations, surgeries, and ER visits in previous 12 months . Vitals . Screenings to include cognitive, depression, and falls . Referrals and appointments  In addition, I have reviewed and discussed with patient certain preventive protocols, quality metrics, and best practice recommendations. A written personalized care plan for preventive services as well as general preventive health recommendations were provided to patient.     Gerilyn Nestle, RN  12/12/2016  PCP Notes: -Mammo and DEXA ordered -Shingrix Rx sent to pharmacy.  -PHQ9=6, scheduled to see psychiatry next week per pt.   Medical screening examination/treatment/procedure(s) were performed by  non-physician practitioner and as supervising physician I was immediately available for consultation/collaboration.  I agree with above assessment and plan.  Electronically Signed by: Howard Pouch, DO  primary [place]

## 2016-12-12 ENCOUNTER — Ambulatory Visit: Payer: Self-pay

## 2016-12-12 ENCOUNTER — Ambulatory Visit (INDEPENDENT_AMBULATORY_CARE_PROVIDER_SITE_OTHER): Payer: Medicare Other | Admitting: Family Medicine

## 2016-12-12 ENCOUNTER — Encounter: Payer: Self-pay | Admitting: Family Medicine

## 2016-12-12 VITALS — BP 138/68 | HR 76 | Temp 97.7°F | Resp 18 | Ht 63.0 in | Wt 170.4 lb

## 2016-12-12 DIAGNOSIS — Z0000 Encounter for general adult medical examination without abnormal findings: Secondary | ICD-10-CM

## 2016-12-12 DIAGNOSIS — T65224A Toxic effect of tobacco cigarettes, undetermined, initial encounter: Secondary | ICD-10-CM | POA: Diagnosis not present

## 2016-12-12 DIAGNOSIS — E039 Hypothyroidism, unspecified: Secondary | ICD-10-CM

## 2016-12-12 DIAGNOSIS — F411 Generalized anxiety disorder: Secondary | ICD-10-CM | POA: Diagnosis not present

## 2016-12-12 DIAGNOSIS — E2839 Other primary ovarian failure: Secondary | ICD-10-CM

## 2016-12-12 DIAGNOSIS — M810 Age-related osteoporosis without current pathological fracture: Secondary | ICD-10-CM

## 2016-12-12 DIAGNOSIS — J449 Chronic obstructive pulmonary disease, unspecified: Secondary | ICD-10-CM | POA: Diagnosis not present

## 2016-12-12 DIAGNOSIS — E785 Hyperlipidemia, unspecified: Secondary | ICD-10-CM | POA: Diagnosis not present

## 2016-12-12 DIAGNOSIS — I10 Essential (primary) hypertension: Secondary | ICD-10-CM

## 2016-12-12 DIAGNOSIS — Z23 Encounter for immunization: Secondary | ICD-10-CM

## 2016-12-12 DIAGNOSIS — Z1239 Encounter for other screening for malignant neoplasm of breast: Secondary | ICD-10-CM

## 2016-12-12 DIAGNOSIS — Z1231 Encounter for screening mammogram for malignant neoplasm of breast: Secondary | ICD-10-CM

## 2016-12-12 DIAGNOSIS — F331 Major depressive disorder, recurrent, moderate: Secondary | ICD-10-CM

## 2016-12-12 MED ORDER — LISINOPRIL 10 MG PO TABS: 10.0000 mg | ORAL_TABLET | Freq: Every day | ORAL | 1 refills | Status: DC

## 2016-12-12 MED ORDER — VENLAFAXINE HCL ER 75 MG PO CP24: ORAL_CAPSULE | ORAL | 1 refills | Status: DC

## 2016-12-12 MED ORDER — TETANUS-DIPHTH-ACELL PERTUSSIS 5-2.5-18.5 LF-MCG/0.5 IM SUSP: 0.5000 mL | Freq: Once | INTRAMUSCULAR | 0 refills | Status: DC

## 2016-12-12 MED ORDER — BUDESONIDE-FORMOTEROL FUMARATE 160-4.5 MCG/ACT IN AERO: 2.0000 | INHALATION_SPRAY | Freq: Two times a day (BID) | RESPIRATORY_TRACT | 6 refills | Status: DC

## 2016-12-12 MED ORDER — BUPROPION HCL ER (SR) 150 MG PO TB12: 150.0000 mg | ORAL_TABLET | Freq: Two times a day (BID) | ORAL | 1 refills | Status: DC

## 2016-12-12 MED ORDER — LEVOTHYROXINE SODIUM 100 MCG PO TABS: 100.0000 ug | ORAL_TABLET | Freq: Every day | ORAL | 3 refills | Status: DC

## 2016-12-12 MED ORDER — ZOSTER VAC RECOMB ADJUVANTED 50 MCG/0.5ML IM SUSR: 0.5000 mL | Freq: Once | INTRAMUSCULAR | 1 refills | Status: DC

## 2016-12-12 MED ORDER — TETANUS-DIPHTH-ACELL PERTUSSIS 5-2.5-18.5 LF-MCG/0.5 IM SUSP: 0.5000 mL | Freq: Once | INTRAMUSCULAR | 0 refills | Status: AC

## 2016-12-12 MED ORDER — ALENDRONATE SODIUM 70 MG PO TABS: 70.0000 mg | ORAL_TABLET | ORAL | 11 refills | Status: DC

## 2016-12-12 MED ORDER — ZOSTER VAC RECOMB ADJUVANTED 50 MCG/0.5ML IM SUSR: 0.5000 mL | Freq: Once | INTRAMUSCULAR | 1 refills | Status: AC

## 2016-12-12 NOTE — Progress Notes (Signed)
Patient ID: Kendra Ball, female  DOB: Oct 26, 1945, 71 y.o.   MRN: [id number] Patient Care Team    Relationship Specialty Notifications Start End  Kendra Hillock, DO PCP - General Family Medicine  12/30/14   Kendra Craver, MD Consulting Physician Gastroenterology  05/09/14   Kendra Seltzer, MD Consulting Physician General Surgery  05/09/14   Kendra Ball, Kendra Ball  Psychology  10/16/15   Kendra Cradle, MD  Psychiatry  12/12/16     Chief Complaint  Patient presents with  . Medicare Wellness  . Annual Exam    Subjective:  Kendra Ball is a 71 y.o.  Female  present for CPE. All past medical history, surgical history, allergies, family history, immunizations, medications and social history were updated in the electronic medical record today. All recent labs, ED visits and hospitalizations within the last year were reviewed.  Hypertension/hyperlipidemia:  Pt reports compliance with lisinopril 10 mg QD. Blood pressures ranges at home WNL. Patient denies chest pain, shortness of breath or lower extremity edema. Pt takes a  daily baby ASA. Pt is prescribed statin. BMP: 09/2015 WNL CBC: 10/13/2015 WNL Lipid: 10/21/2015 WNL Diet: low sodium Exercise: not routinely RF: HTN, HLD, obesity, smoker, Fhx HD  Depression/ anxiety: Pt reports she is doing well on current regimen of buspar and  Trazodone prescribed by psych and  Effexor and Wellbutrin provided here. She denies nay side effects from medications and feels her conditions are well controlled.   Hypothyroidism:  Pt reports compliance with synthroid 100 Mg QD. She denies any changes to her BM (she has chronic difficulties with BM), palpitations, flushing. Last TSH 2.24 12/2015.  Health maintenance:  Colonoscopy: completed 12/2015, by Dr. Luvenia Ball, resutls  follow up 5 years. Mammogram: completed:09/2014, birads 1. Mammogram ordered today. Cervical cancer screening: > 61 yo, N/A Immunizations: tdap script printed for her today,  Influenza  (encouraged yearly), PNA series completed, shingrix ordered today Infectious disease screening:  Hep C completed DEXA: last completed 10/07/2014, result -2.7 osteoporosis, follow needed 2018-2019. Pt on fosamax.  Assistive device: none Oxygen HLK:TGYB Patient has a Dental home. Hospitalizations/ED visits: reviewed   Depression screen [facility] 2/9 12/12/2016 10/16/2015 09/10/2014 08/13/2013  Decreased Interest 2 0 0 3  Down, Depressed, Hopeless 0 0 0 3  PHQ - 2 Score 2 0 0 6  Altered sleeping 0 - - -  Tired, decreased energy 3 - - -  Change in appetite 0 - - -  Feeling bad or failure about yourself  1 - - -  Trouble concentrating 0 - - -  Moving slowly or fidgety/restless 0 - - -  Suicidal thoughts 0 - - -  PHQ-9 Score 6 - - -  Difficult doing work/chores Not difficult at all - - -  Some encounter information is confidential and restricted. Go to Review Flowsheets activity to see all data.   No flowsheet data found.   Current Exercise Habits: The patient does not participate in regular exercise at present (Maintain household) Exercise limited by: None identified   Immunization History  Administered Date(s) Administered  . Influenza Whole 05/07/2010  . Influenza, High Dose Seasonal PF 05/25/2016  . Influenza,inj,Quad PF,6+ Mos 03/13/2014, 12/30/2014  . PPD Test 09/20/2013  . Pneumococcal Conjugate-13 09/10/2014  . Pneumococcal Polysaccharide-23 08/13/2013  . Tdap 08/08/2005     Past Medical History:  Diagnosis Date  . Anxiety   . Arthritis   . DEPRESSION 08/26/2008  . Hx of colonic polyps    First  noted on  colonoscopy 2012  . Hypercholesteremia   . HYPERTENSION 08/26/2008  . HYPOTHYROIDISM 08/26/2008  . Obesity   . Osteoporosis   . Substance abuse    inpatient and outpatient tx for substance abuse  . TOBACCO USER 12/25/2009   Allergies  Allergen Reactions  . Morphine Sulfate     Swelling around injection area   Past Surgical History:  Procedure Laterality Date   . [place] SURGERY  2006  . CESAREAN SECTION    . COLON RESECTION N/A 05/09/2014   Procedure: LAPAROSCOPIC HAND-ASSISTED EXTENDED RIGHT COLECTOMY, LAPAROSCOPIC LYSIS OF ADHESIONS, SPLENIC FLEXURE MOBILIZATION.;  Surgeon: Kendra Boston, MD;  Location: [facility];  Service: General;  Laterality: N/A;  . DILATION AND CURETTAGE OF UTERUS     x2  . FOREARM FRACTURE SURGERY     right  . GASTRIC BYPASS    . TONSILLECTOMY     Family History  Problem Relation Age of Onset  . Cancer Mother        colon  . Dementia Mother   . Depression Mother   . Heart disease Father 23  . Heart disease Sister 75       stents, pacemaker  . Depression Sister   . Heart disease Brother 60       CABG, MI  . Depression Brother   . Obesity Daughter   . Suicidality Daughter   . Bipolar disorder Daughter   . Obesity Son   . Mental illness Daughter        bipolar  . Obesity Daughter   . Depression Sister   . Dementia Paternal Aunt   . Alcohol abuse Maternal Uncle   . Colon cancer Maternal Aunt   . Stomach cancer Maternal Grandfather    Social History   Social History  . Marital status: Married    Spouse name: N/A  . Number of children: 3  . Years of education: N/A   Occupational History  . [employer]   Social History Main Topics  . Smoking status: Current Every Day Smoker    Packs/day: 1.00    Years: 50.00    Types: Cigarettes  . Smokeless tobacco: Never Used  . Alcohol use No     Comment: quit on Aug 27, 2015  . Drug use: No     Comment: last used May 2017  . Sexual activity: Not Currently    Partners: Male    Birth control/ protection: Post-menopausal   Other Topics Concern  . Not on file   Social History Narrative   [name] lives in [place] with her husband. She has 3 grown children and several grand children. She has joint custody of 3 of her grand children as her daughter has bipolar disorder and has needed assistance with children in past.   Allergies as of  12/12/2016      Reactions   Morphine Sulfate    Swelling around injection area      Medication List       Accurate as of 12/12/16 11:59 PM. Always use your most recent med list.          albuterol 108 (90 Base) MCG/ACT inhaler Commonly known as:  PROVENTIL HFA;VENTOLIN HFA Inhale 2 puffs into the lungs every 6 (six) hours as needed for wheezing or shortness of breath.   alendronate 70 MG tablet Commonly known as:  FOSAMAX Take 1 tablet (70 mg total) by mouth once a week. Reported on 10/06/2015   aspirin 81  MG tablet Take 81 mg by mouth daily.   budesonide-formoterol 160-4.5 MCG/ACT inhaler Commonly known as:  SYMBICORT Inhale 2 puffs into the lungs 2 (two) times daily.   buPROPion 150 MG 12 hr tablet Commonly known as:  WELLBUTRIN SR Take 1 tablet (150 mg total) by mouth 2 (two) times daily.   busPIRone 10 MG tablet Commonly known as:  BUSPAR Take 1 tablet (10 mg total) by mouth 3 (three) times daily.   ferrous fumarate 325 (106 Fe) MG Tabs tablet Commonly known as:  HEMOCYTE - 106 mg FE Take 1 tablet by mouth daily. Reported on 10/06/2015   levothyroxine 100 MCG tablet Commonly known as:  SYNTHROID, LEVOTHROID Take 1 tablet (100 mcg total) by mouth daily before breakfast.   lisinopril 10 MG tablet Commonly known as:  PRINIVIL,ZESTRIL Take 1 tablet (10 mg total) by mouth daily.   Magnesium 250 MG Tabs Take 250 mg by mouth 2 (two) times daily.   simvastatin 10 MG tablet Commonly known as:  ZOCOR Take 1 tablet (10 mg total) by mouth at bedtime.   Tdap 5-2.5-18.5 LF-MCG/0.5 injection Commonly known as:  BOOSTRIX Inject 0.5 mLs into the muscle once.   traZODone 150 MG tablet Commonly known as:  DESYREL Take 1 tablet (150 mg total) by mouth at bedtime.   venlafaxine XR 75 MG 24 hr capsule Commonly known as:  EFFEXOR-XR TAKE ONE CAPSULE BY MOUTH EVERY DAY WITH BREAKFAST   VITAMIN B-12 PO Take 1 tablet by mouth daily.   Zoster Vac Recomb Adjuvanted  injection Commonly known as:  SHINGRIX Inject 0.5 mLs into the muscle once.            Discharge Care Instructions        Start     Ordered   12/12/16 0000  MM Digital Screening    Question Answer Comment  Reason for Exam (SYMPTOM  OR DIAGNOSIS REQUIRED) screening   Preferred imaging location? [facility]      12/12/16 1330   12/12/16 0000  DG Bone Density    Question Answer Comment  Reason for Exam (SYMPTOM  OR DIAGNOSIS REQUIRED) screening   Preferred imaging location? [facility]      12/12/16 1330   12/12/16 0000  venlafaxine XR (EFFEXOR-XR) 75 MG 24 hr capsule     12/12/16 1423   12/12/16 0000  lisinopril (PRINIVIL,ZESTRIL) 10 MG tablet  Daily     12/12/16 1423   12/12/16 0000  levothyroxine (SYNTHROID, LEVOTHROID) 100 MCG tablet  Daily before breakfast    Comments:  Needs office visit prior to anymore refills.   12/12/16 1423   12/12/16 0000  buPROPion (WELLBUTRIN SR) 150 MG 12 hr tablet  2 times daily    Comments:  Pt to use this to replace prior Wellbutrin script for 3 months while smoking cessation trial, then she will return to prior dose by other provider. No refills on this script are to be requested.   12/12/16 1423   12/12/16 0000  budesonide-formoterol (SYMBICORT) 160-4.5 MCG/ACT inhaler  2 times daily     12/12/16 1423   12/12/16 0000  alendronate (FOSAMAX) 70 MG tablet  Weekly     12/12/16 1423   12/12/16 0000  Tdap (BOOSTRIX) 5-2.5-18.5 LF-MCG/0.5 injection   Once     12/12/16 1426   12/12/16 0000  Zoster Vac Recomb Adjuvanted [facility]) injection   Once     12/12/16 1430      All past medical history, surgical history, allergies, family  history, immunizations andmedications were updated in the EMR today and reviewed under the history and medication portions of their EMR.     Recent Results (from the past 2160 hour(s))  CBC w/Diff     Status: None   Collection Time: 12/06/16  8:23 AM  Result Value Ref Range   WBC 6.4 4.0 - 10.5 K/uL    RBC 4.54 3.87 - 5.11 Mil/uL   Hemoglobin 14.7 12.0 - 15.0 g/dL   HCT 44.5 36.0 - 46.0 %   MCV 98.0 78.0 - 100.0 fl   MCHC 33.0 30.0 - 36.0 g/dL   RDW 13.0 11.5 - 15.5 %   Platelets 282.0 150.0 - 400.0 K/uL   Neutrophils Relative % 68.4 43.0 - 77.0 %   Lymphocytes Relative 21.6 12.0 - 46.0 %   Monocytes Relative 7.6 3.0 - 12.0 %   Eosinophils Relative 1.1 0.0 - 5.0 %   Basophils Relative 1.3 0.0 - 3.0 %   Neutro Abs 4.4 1.4 - 7.7 K/uL   Lymphs Abs 1.4 0.7 - 4.0 K/uL   Monocytes Absolute 0.5 0.1 - 1.0 K/uL   Eosinophils Absolute 0.1 0.0 - 0.7 K/uL   Basophils Absolute 0.1 0.0 - 0.1 K/uL  Comp Met (CMET)     Status: None   Collection Time: 12/06/16  8:23 AM  Result Value Ref Range   Sodium 141 135 - 145 mEq/L   Potassium 4.6 3.5 - 5.1 mEq/L   Chloride 103 96 - 112 mEq/L   CO2 32 19 - 32 mEq/L   Glucose, Bld 90 70 - 99 mg/dL   BUN 10 6 - 23 mg/dL   Creatinine, Ser 0.74 0.40 - 1.20 mg/dL   Total Bilirubin 0.6 0.2 - 1.2 mg/dL   Alkaline Phosphatase 58 39 - 117 U/L   AST 15 0 - 37 U/L   ALT 12 0 - 35 U/L   Total Protein 6.0 6.0 - 8.3 g/dL   Albumin 4.2 3.5 - 5.2 g/dL   Calcium 9.5 8.4 - 10.5 mg/dL   GFR 82.34 >60.00 mL/min  Lipid panel     Status: None   Collection Time: 12/06/16  8:23 AM  Result Value Ref Range   Cholesterol 164 0 - 200 mg/dL    Comment: ATP III Classification       Desirable:  < 200 mg/dL               Borderline High:  200 - 239 mg/dL          High:  > = 240 mg/dL   Triglycerides 95.0 0.0 - 149.0 mg/dL    Comment: Normal:  <150 mg/dLBorderline High:  150 - 199 mg/dL   HDL 70.50 >39.00 mg/dL   VLDL 19.0 0.0 - 40.0 mg/dL   LDL Cholesterol 74 0 - 99 mg/dL   Total CHOL/HDL Ratio 2     Comment:                Men          Women1/2 Average Risk     3.4          3.3Average Risk          5.0          4.42X Average Risk          9.6          7.13X Average Risk          15.0  11.0                       NonHDL 93.30     Comment: NOTE:  Non-HDL goal should be  30 mg/dL higher than patient's LDL goal (i.e. LDL goal of < 70 mg/dL, would have non-HDL goal of < 100 mg/dL)  TSH     Status: None   Collection Time: 12/06/16  8:23 AM  Result Value Ref Range   TSH 0.65 0.35 - 4.50 uIU/mL  Vitamin D (25 hydroxy)     Status: None   Collection Time: 12/06/16  8:23 AM  Result Value Ref Range   VITD 43.03 30.00 - 100.00 ng/mL  HgB A1c     Status: None   Collection Time: 12/06/16  8:23 AM  Result Value Ref Range   Hgb A1c MFr Bld 5.2 4.6 - 6.5 %    Comment: Glycemic Control Guidelines for People with Diabetes:Non Diabetic:  <6%Goal of Therapy: <7%Additional Action Suggested:  >8%     Ct Chest Lung Cancer Screening Low Dose Wo Contrast  Result Date: 11/07/2016 CLINICAL DATA:  71 year old female current smoker, with 53 pack-year history of smoking, for follow-up lung cancer screening EXAM: CT CHEST WITHOUT CONTRAST LOW-DOSE FOR LUNG CANCER SCREENING TECHNIQUE: Multidetector CT imaging of the chest was performed following the standard protocol without IV contrast. COMPARISON:  Low-dose lung cancer screening CT chest dated 10/13/2015 FINDINGS: Cardiovascular: Heart is normal in size.  No pericardial effusion. No evidence of thoracic aortic aneurysm. Atherosclerotic calcification the aortic arch. Three vessel coronary atherosclerosis. Mediastinum/Nodes: No suspicious mediastinal lymphadenopathy. Calcified left perihilar nodes. Visualized thyroid is grossly unremarkable. Lungs/Pleura: Calcified granuloma in the superior segment left lower lobe (series 3/ image 87). 2.4 mm subpleural right upper lobe pulmonary nodule. No focal consolidation. Mild linear scarring/atelectasis in the left upper lobe. No pleural effusion or pneumothorax. Upper Abdomen: Visualized upper abdomen is notable for postsurgical changes related to gastric bypass. Musculoskeletal: Mild degenerative changes of the visualized thoracolumbar spine. IMPRESSION: Lung-RADS 2, benign appearance or behavior.  Continue annual screening with low-dose chest CT without contrast in 12 months. Aortic Atherosclerosis (ICD10-I70.0). Electronically Signed   By: Julian Hy M.D.   On: 11/07/2016 12:28     ROS: 14 pt review of systems performed and negative (unless mentioned in an HPI)  Objective: BP 138/68 (BP Location: Left Arm, Patient Position: Sitting, Cuff Size: Normal)   Pulse 76   Temp 97.7 F (36.5 C) (Oral)   Resp 18   Ht '5\' 3"'$  (1.6 m)   Wt 170 lb 6.4 oz (77.3 kg)   SpO2 95%   BMI 30.19 kg/m  Gen: Afebrile. No acute distress. Nontoxic in appearance, well-developed, well-nourished,  Pleasant caucasian female.  HENT: AT. [place]. Bilateral TM visualized and normal in appearance, normal external auditory canal. MMM, no oral lesions, adequate dentition. Bilateral nares within normal limits. Throat without erythema, ulcerations or exudates. no Cough on exam, no hoarseness on exam. Eyes:Pupils Equal Round Reactive to light, Extraocular movements intact,  Conjunctiva without redness, discharge or icterus. Neck/lymp/endocrine: Supple,no lymphadenopathy, no thyromegaly CV: RRR no murmur, no edema, +2/4 P posterior tibialis pulses. no carotid bruits. No JVD. Chest: CTAB, mild wheeze, no rhonchi or crackles. normal Respiratory effort. good Air movement. Abd: Soft. obese. NTND. BS present. no Masses palpated. No hepatosplenomegaly. No rebound tenderness or guarding. Skin: no rashes, purpura or petechiae. Warm and well-perfused. Skin intact. Neuro/Msk:  Normal gait. PERLA. EOMi. Alert. Oriented x3.  Cranial  nerves II through XII intact. Muscle strength 5/5 upper/lower extremity. DTRs equal bilaterally. Psych: Normal affect, dress and demeanor. Normal speech. Normal thought content and judgment.   Hearing Screening Comments: Able to hear conversational tones w/o difficulty. No issues reported.   Vision Screening Comments: Last exam 2015. Will make appt.  Wears glasses.   Assessment/plan: Kendra Ball is a 71 y.o. female present for CPE. Encounter for Medicare annual wellness exam Patient was encouraged to exercise greater than 150 minutes a week. Patient was encouraged to choose a diet filled with fresh fruits and vegetables, and lean meats. AVS provided to patient today for education/recommendation on gender specific health and safety maintenance. Colonoscopy: completed 12/2015, by Dr. Luvenia Ball, resutls  follow up 5 years. Mammogram: completed:09/2014, birads 1. Mammogram ordered today. Cervical cancer screening: > 44 yo, N/A Immunizations: tdap script printed for her today, Influenza  (encouraged yearly), PNA series completed, shingrix ordered today Infectious disease screening:  Hep C completed DEXA: last completed 10/07/2014, result -2.7 osteoporosis , follow needed 2018-2019. Pt on fosamax. Ordered today with mammogram.  Need for shingles vaccine - Zoster Vac Recomb Adjuvanted (SHINGRIX) injection; Inject 0.5 mLs into the muscle once.  Dispense: 0.5 mL; Refill: 1 Screening breast examination - MM Digital Screening; Future Estrogen deficiency - DG Bone Density; Future Essential hypertension/dyslipidemia - stable. Refills on lisinopril. - low sodium diet. - cbc, cmp, lipids collected today - lisinopril (PRINIVIL,ZESTRIL) 10 MG tablet; Take 1 tablet (10 mg total) by mouth daily.  Dispense: 90 tablet; Refill: 1 MDD (major depressive disorder), recurrent episode, moderate (HCC)/GAD - stable chronic. - trazodone and buspar refilled by psych.  - refills on Wellbutrin and Effexor.  - venlafaxine XR (EFFEXOR-XR) 75 MG 24 hr capsule; TAKE ONE CAPSULE BY MOUTH EVERY DAY WITH BREAKFAST  Dispense: 90 capsule; Refill: 1 - venlafaxine XR (EFFEXOR-XR) 75 MG 24 hr capsule; TAKE ONE CAPSULE BY MOUTH EVERY DAY WITH BREAKFAST  Dispense: 90 capsule; Refill: 1 COPD (chronic obstructive pulmonary disease) with chronic bronchitis (HCC) - budesonide-formoterol (SYMBICORT) 160-4.5 MCG/ACT inhaler;  Inhale 2 puffs into the lungs 2 (two) times daily.  Dispense: 1 Inhaler; Refill: 6 Osteoporosis:  - refills on fosamx, not taking routinely. Encouraged routine use and DEXA ordered.  . Return in about 6 months (around 06/14/2017) for depression/.  Electronically signed by: Howard Pouch, [place]

## 2016-12-12 NOTE — Patient Instructions (Addendum)
Make eye appointment.   Schedule mammogram and bone scan.   Shingles vaccine at pharmacy  Bring a copy of your living will and/or healthcare power of attorney to your next office visit.  Continue doing brain stimulating activities (puzzles, reading, adult coloring books, staying active) to keep memory sharp.   Health Maintenance, Female Adopting a healthy lifestyle and getting preventive care can go a long way to promote health and wellness. Talk with your health care provider about what schedule of regular examinations is right for you. This is a good chance for you to check in with your provider about disease prevention and staying healthy. In between checkups, there are plenty of things you can do on your own. Experts have done a lot of research about which lifestyle changes and preventive measures are most likely to keep you healthy. Ask your health care provider for more information. Weight and diet Eat a healthy diet  Be sure to include plenty of vegetables, fruits, low-fat dairy products, and lean protein.  Do not eat a lot of foods high in solid fats, added sugars, or salt.  Get regular exercise. This is one of the most important things you can do for your health. ? Most adults should exercise for at least 150 minutes each week. The exercise should increase your heart rate and make you sweat (moderate-intensity exercise). ? Most adults should also do strengthening exercises at least twice a week. This is in addition to the moderate-intensity exercise.  Maintain a healthy weight  Body mass index (BMI) is a measurement that can be used to identify possible weight problems. It estimates body fat based on height and weight. Your health care provider can help determine your BMI and help you achieve or maintain a healthy weight.  For females 9 years of age and older: ? A BMI below 18.5 is considered underweight. ? A BMI of 18.5 to 24.9 is normal. ? A BMI of 25 to 29.9 is considered  overweight. ? A BMI of 30 and above is considered obese.  Watch levels of cholesterol and blood lipids  You should start having your blood tested for lipids and cholesterol at 70 years of age, then have this test every 5 years.  You may need to have your cholesterol levels checked more often if: ? Your lipid or cholesterol levels are high. ? You are older than 70 years of age. ? You are at high risk for heart disease.  Cancer screening Lung Cancer  Lung cancer screening is recommended for adults 69-61 years old who are at high risk for lung cancer because of a history of smoking.  A yearly low-dose CT scan of the lungs is recommended for people who: ? Currently smoke. ? Have quit within the past 15 years. ? Have at least a 30-pack-year history of smoking. A pack year is smoking an average of one pack of cigarettes a day for 1 year.  Yearly screening should continue until it has been 15 years since you quit.  Yearly screening should stop if you develop a health problem that would prevent you from having lung cancer treatment.  Breast Cancer  Practice breast self-awareness. This means understanding how your breasts normally appear and feel.  It also means doing regular breast self-exams. Let your health care provider know about any changes, no matter how small.  If you are in your 20s or 30s, you should have a clinical breast exam (CBE) by a health care provider every 1-3 years as  part of a regular health exam.  If you are 40 or older, have a CBE every year. Also consider having a breast X-ray (mammogram) every year.  If you have a family history of breast cancer, talk to your health care provider about genetic screening.  If you are at high risk for breast cancer, talk to your health care provider about having an MRI and a mammogram every year.  Breast cancer gene (BRCA) assessment is recommended for women who have family members with BRCA-related cancers. BRCA-related cancers  include: ? Breast. ? Ovarian. ? Tubal. ? Peritoneal cancers.  Results of the assessment will determine the need for genetic counseling and BRCA1 and BRCA2 testing.  Cervical Cancer Your health care provider may recommend that you be screened regularly for cancer of the pelvic organs (ovaries, uterus, and vagina). This screening involves a pelvic examination, including checking for microscopic changes to the surface of your cervix (Pap test). You may be encouraged to have this screening done every 3 years, beginning at age 66.  For women ages 23-65, health care providers may recommend pelvic exams and Pap testing every 3 years, or they may recommend the Pap and pelvic exam, combined with testing for human papilloma virus (HPV), every 5 years. Some types of HPV increase your risk of cervical cancer. Testing for HPV may also be done on women of any age with unclear Pap test results.  Other health care providers may not recommend any screening for nonpregnant women who are considered low risk for pelvic cancer and who do not have symptoms. Ask your health care provider if a screening pelvic exam is right for you.  If you have had past treatment for cervical cancer or a condition that could lead to cancer, you need Pap tests and screening for cancer for at least 20 years after your treatment. If Pap tests have been discontinued, your risk factors (such as having a new sexual partner) need to be reassessed to determine if screening should resume. Some women have medical problems that increase the chance of getting cervical cancer. In these cases, your health care provider may recommend more frequent screening and Pap tests.  Colorectal Cancer  This type of cancer can be detected and often prevented.  Routine colorectal cancer screening usually begins at 71 years of age and continues through 69 years of age.  Your health care provider may recommend screening at an earlier age if you have risk factors  for colon cancer.  Your health care provider may also recommend using home test kits to check for hidden blood in the stool.  A small camera at the end of a tube can be used to examine your colon directly (sigmoidoscopy or colonoscopy). This is done to check for the earliest forms of colorectal cancer.  Routine screening usually begins at age 84.  Direct examination of the colon should be repeated every 5-10 years through 70 years of age. However, you may need to be screened more often if early forms of precancerous polyps or small growths are found.  Skin Cancer  Check your skin from head to toe regularly.  Tell your health care provider about any new moles or changes in moles, especially if there is a change in a mole's shape or color.  Also tell your health care provider if you have a mole that is larger than the size of a pencil eraser.  Always use sunscreen. Apply sunscreen liberally and repeatedly throughout the day.  Protect yourself by wearing  long sleeves, pants, a wide-brimmed hat, and sunglasses whenever you are outside.  Heart disease, diabetes, and high blood pressure  High blood pressure causes heart disease and increases the risk of stroke. High blood pressure is more likely to develop in: ? People who have blood pressure in the high end of the normal range (130-139/85-89 mm Hg). ? People who are overweight or obese. ? People who are African American.  If you are 84-53 years of age, have your blood pressure checked every 3-5 years. If you are 36 years of age or older, have your blood pressure checked every year. You should have your blood pressure measured twice-once when you are at a hospital or clinic, and once when you are not at a hospital or clinic. Record the average of the two measurements. To check your blood pressure when you are not at a hospital or clinic, you can use: ? An automated blood pressure machine at a pharmacy. ? A home blood pressure monitor.  If  you are between 81 years and 46 years old, ask your health care provider if you should take aspirin to prevent strokes.  Have regular diabetes screenings. This involves taking a blood sample to check your fasting blood sugar level. ? If you are at a normal weight and have a low risk for diabetes, have this test once every three years after 70 years of age. ? If you are overweight and have a high risk for diabetes, consider being tested at a younger age or more often. Preventing infection Hepatitis B  If you have a higher risk for hepatitis B, you should be screened for this virus. You are considered at high risk for hepatitis B if: ? You were born in a country where hepatitis B is common. Ask your health care provider which countries are considered high risk. ? Your parents were born in a high-risk country, and you have not been immunized against hepatitis B (hepatitis B vaccine). ? You have HIV or AIDS. ? You use needles to inject street drugs. ? You live with someone who has hepatitis B. ? You have had sex with someone who has hepatitis B. ? You get hemodialysis treatment. ? You take certain medicines for conditions, including cancer, organ transplantation, and autoimmune conditions.  Hepatitis C  Blood testing is recommended for: ? Everyone born from [birth year] through 1964. ? Anyone with known risk factors for hepatitis C.  Sexually transmitted infections (STIs)  You should be screened for sexually transmitted infections (STIs) including gonorrhea and chlamydia if: ? You are sexually active and are younger than 70 years of age. ? You are older than 70 years of age and your health care provider tells you that you are at risk for this type of infection. ? Your sexual activity has changed since you were last screened and you are at an increased risk for chlamydia or gonorrhea. Ask your health care provider if you are at risk.  If you do not have HIV, but are at risk, it may be recommended  that you take a prescription medicine daily to prevent HIV infection. This is called pre-exposure prophylaxis (PrEP). You are considered at risk if: ? You are sexually active and do not regularly use condoms or know the HIV status of your partner(s). ? You take drugs by injection. ? You are sexually active with a partner who has HIV.  Talk with your health care provider about whether you are at high risk of being infected with HIV.  If you choose to begin PrEP, you should first be tested for HIV. You should then be tested every 3 months for as long as you are taking PrEP. Pregnancy  If you are premenopausal and you may become pregnant, ask your health care provider about preconception counseling.  If you may become pregnant, take 400 to 800 micrograms (mcg) of folic acid every day.  If you want to prevent pregnancy, talk to your health care provider about birth control (contraception). Osteoporosis and menopause  Osteoporosis is a disease in which the bones lose minerals and strength with aging. This can result in serious bone fractures. Your risk for osteoporosis can be identified using a bone density scan.  If you are 42 years of age or older, or if you are at risk for osteoporosis and fractures, ask your health care provider if you should be screened.  Ask your health care provider whether you should take a calcium or vitamin D supplement to lower your risk for osteoporosis.  Menopause may have certain physical symptoms and risks.  Hormone replacement therapy may reduce some of these symptoms and risks. Talk to your health care provider about whether hormone replacement therapy is right for you. Follow these instructions at home:  Schedule regular health, dental, and eye exams.  Stay current with your immunizations.  Do not use any tobacco products including cigarettes, chewing tobacco, or electronic cigarettes.  If you are pregnant, do not drink alcohol.  If you are  breastfeeding, limit how much and how often you drink alcohol.  Limit alcohol intake to no more than 1 drink per day for nonpregnant women. One drink equals 12 ounces of beer, 5 ounces of wine, or 1 ounces of hard liquor.  Do not use street drugs.  Do not share needles.  Ask your health care provider for help if you need support or information about quitting drugs.  Tell your health care provider if you often feel depressed.  Tell your health care provider if you have ever been abused or do not feel safe at home. This information is not intended to replace advice given to you by your health care provider. Make sure you discuss any questions you have with your health care provider. Document Released: 10/25/2010 Document Revised: 09/17/2015 Document Reviewed: 01/13/2015 Elsevier Interactive Patient Education  2018 [employer].   Steps to Quit Smoking Smoking tobacco can be bad for your health. It can also affect almost every organ in your body. Smoking puts you and people around you at risk for many serious long-lasting (chronic) diseases. Quitting smoking is hard, but it is one of the best things that you can do for your health. It is never too late to quit. What are the benefits of quitting smoking? When you quit smoking, you lower your risk for getting serious diseases and conditions. They can include:  Lung cancer or lung disease.  Heart disease.  Stroke.  Heart attack.  Not being able to have children (infertility).  Weak bones (osteoporosis) and broken bones (fractures).  If you have coughing, wheezing, and shortness of breath, those symptoms may get better when you quit. You may also get sick less often. If you are pregnant, quitting smoking can help to lower your chances of having a baby of low birth weight. What can I do to help me quit smoking? Talk with your doctor about what can help you quit smoking. Some things you can do (strategies) include:  Quitting smoking  totally, instead of slowly cutting  back how much you smoke over a period of time.  Going to in-person counseling. You are more likely to quit if you go to many counseling sessions.  Using resources and support systems, such as: ? Database administrator with a Social worker. ? Phone quitlines. ? Careers information officer. ? Support groups or group counseling. ? Text messaging programs. ? Mobile phone apps or applications.  Taking medicines. Some of these medicines may have nicotine in them. If you are pregnant or breastfeeding, do not take any medicines to quit smoking unless your doctor says it is okay. Talk with your doctor about counseling or other things that can help you.  Talk with your doctor about using more than one strategy at the same time, such as taking medicines while you are also going to in-person counseling. This can help make quitting easier. What things can I do to make it easier to quit? Quitting smoking might feel very hard at first, but there is a lot that you can do to make it easier. Take these steps:  Talk to your family and friends. Ask them to support and encourage you.  Call phone quitlines, reach out to support groups, or work with a Social worker.  Ask people who smoke to not smoke around you.  Avoid places that make you want (trigger) to smoke, such as: ? Bars. ? Parties. ? Smoke-break areas at work.  Spend time with people who do not smoke.  Lower the stress in your life. Stress can make you want to smoke. Try these things to help your stress: ? Getting regular exercise. ? Deep-breathing exercises. ? Yoga. ? Meditating. ? Doing a body scan. To do this, close your eyes, focus on one area of your body at a time from head to toe, and notice which parts of your body are tense. Try to relax the muscles in those areas.  Download or buy apps on your mobile phone or tablet that can help you stick to your quit plan. There are many free apps, such as QuitGuide from the [employer]  Office manager for Disease Control and Prevention). You can find more support from smokefree.gov and other websites.  This information is not intended to replace advice given to you by your health care provider. Make sure you discuss any questions you have with your health care provider. Document Released: 02/05/2009 Document Revised: 12/08/2015 Document Reviewed: 08/26/2014 Elsevier Interactive Patient Education  2018 [employer].

## 2016-12-14 ENCOUNTER — Encounter: Payer: Self-pay | Admitting: Family Medicine

## 2016-12-25 ENCOUNTER — Other Ambulatory Visit (HOSPITAL_COMMUNITY): Payer: Self-pay | Admitting: Psychiatry

## 2016-12-25 DIAGNOSIS — F99 Mental disorder, not otherwise specified: Secondary | ICD-10-CM

## 2016-12-25 DIAGNOSIS — F411 Generalized anxiety disorder: Secondary | ICD-10-CM

## 2016-12-25 DIAGNOSIS — F5105 Insomnia due to other mental disorder: Secondary | ICD-10-CM

## 2016-12-25 DIAGNOSIS — F331 Major depressive disorder, recurrent, moderate: Secondary | ICD-10-CM

## 2016-12-25 DIAGNOSIS — F1020 Alcohol dependence, uncomplicated: Secondary | ICD-10-CM

## 2016-12-28 ENCOUNTER — Other Ambulatory Visit (HOSPITAL_COMMUNITY): Payer: Self-pay

## 2016-12-28 DIAGNOSIS — F411 Generalized anxiety disorder: Secondary | ICD-10-CM

## 2016-12-28 DIAGNOSIS — F99 Mental disorder, not otherwise specified: Secondary | ICD-10-CM

## 2016-12-28 DIAGNOSIS — F331 Major depressive disorder, recurrent, moderate: Secondary | ICD-10-CM

## 2016-12-28 DIAGNOSIS — F1020 Alcohol dependence, uncomplicated: Secondary | ICD-10-CM

## 2016-12-28 DIAGNOSIS — F5105 Insomnia due to other mental disorder: Secondary | ICD-10-CM

## 2016-12-28 MED ORDER — TRAZODONE HCL 150 MG PO TABS: 150.0000 mg | ORAL_TABLET | Freq: Every day | ORAL | 0 refills | Status: DC

## 2016-12-28 MED ORDER — BUSPIRONE HCL 10 MG PO TABS: 10.0000 mg | ORAL_TABLET | Freq: Three times a day (TID) | ORAL | 0 refills | Status: DC

## 2016-12-28 NOTE — Progress Notes (Signed)
Patient is calling for refill son her Trazodone and Buspar - she has a follow up next week, but will be out of medication tomorrow. Per protocol I sent a 30 day supply to the pharmacy and called patient to let her know

## 2017-01-05 ENCOUNTER — Ambulatory Visit (INDEPENDENT_AMBULATORY_CARE_PROVIDER_SITE_OTHER): Payer: Medicare Other | Admitting: Psychiatry

## 2017-01-05 ENCOUNTER — Encounter (HOSPITAL_COMMUNITY): Payer: Self-pay | Admitting: Psychiatry

## 2017-01-05 DIAGNOSIS — F1021 Alcohol dependence, in remission: Secondary | ICD-10-CM

## 2017-01-05 DIAGNOSIS — F1020 Alcohol dependence, uncomplicated: Secondary | ICD-10-CM

## 2017-01-05 DIAGNOSIS — T65224A Toxic effect of tobacco cigarettes, undetermined, initial encounter: Secondary | ICD-10-CM

## 2017-01-05 DIAGNOSIS — Z79899 Other long term (current) drug therapy: Secondary | ICD-10-CM | POA: Diagnosis not present

## 2017-01-05 DIAGNOSIS — F99 Mental disorder, not otherwise specified: Secondary | ICD-10-CM

## 2017-01-05 DIAGNOSIS — F5105 Insomnia due to other mental disorder: Secondary | ICD-10-CM | POA: Diagnosis not present

## 2017-01-05 DIAGNOSIS — F17210 Nicotine dependence, cigarettes, uncomplicated: Secondary | ICD-10-CM

## 2017-01-05 DIAGNOSIS — F411 Generalized anxiety disorder: Secondary | ICD-10-CM

## 2017-01-05 DIAGNOSIS — Z818 Family history of other mental and behavioral disorders: Secondary | ICD-10-CM

## 2017-01-05 DIAGNOSIS — F331 Major depressive disorder, recurrent, moderate: Secondary | ICD-10-CM

## 2017-01-05 MED ORDER — BUPROPION HCL ER (SR) 150 MG PO TB12: 150.0000 mg | ORAL_TABLET | Freq: Two times a day (BID) | ORAL | 0 refills | Status: DC

## 2017-01-05 MED ORDER — BUSPIRONE HCL 10 MG PO TABS: 10.0000 mg | ORAL_TABLET | Freq: Three times a day (TID) | ORAL | 0 refills | Status: DC

## 2017-01-05 MED ORDER — VENLAFAXINE HCL ER 75 MG PO CP24: ORAL_CAPSULE | ORAL | 0 refills | Status: DC

## 2017-01-05 MED ORDER — TRAZODONE HCL 150 MG PO TABS: 150.0000 mg | ORAL_TABLET | Freq: Every day | ORAL | 0 refills | Status: DC

## 2017-01-05 NOTE — Progress Notes (Signed)
[place] MD/PA/NP OP Progress Note  01/05/2017 9:53 AM Kendra Ball  MRN:  [id number]  Chief Complaint:  Chief Complaint    Follow-up; Medication Refill     HPI: Pt states she was depressed all last month. She was feeling sad mood, low energy, spending all day in bed, low motivation, anhedonia, hopelss and isolation.  It improved about 2 ago when she went on a camping trip with her husband. States she feels "normal again". She is no longer experiencing anhedonia and isolating. Pt is social and going out more. She is no longer spending all day in bed. She is no longer hopelessness. Pt is sleeping about 7-9 hrs/night with Trazodone. Energy has improved. Appetite is ok. Pt denies SI/HI.  Pt states she is easily overwhelmed. Anxiety comes and goes but doesn't last long. She will treat by distracting and walking away from the stressors.  Pt is not drinking alcohol. She goes to [employer] about 1-2x/week and it helps a lot. She meets with her sponsor once a week.  Pt states she learned a lot of coping skills in therapy and she is using them. It has helped.  Taking meds as prescribed and denies SE.   Visit Diagnosis:    ICD-10-CM   1. Toxic effect of tobacco cigarette, undetermined intent, initial encounter T65.224A buPROPion (WELLBUTRIN SR) 150 MG 12 hr tablet  2. Alcohol use disorder, moderate, dependence (HCC) F10.20 busPIRone (BUSPAR) 10 MG tablet    traZODone (DESYREL) 150 MG tablet  3. MDD (major depressive disorder), recurrent episode, moderate (HCC) F33.1 busPIRone (BUSPAR) 10 MG tablet    traZODone (DESYREL) 150 MG tablet    venlafaxine XR (EFFEXOR-XR) 75 MG 24 hr capsule  4. GAD (generalized anxiety disorder) F41.1 busPIRone (BUSPAR) 10 MG tablet    venlafaxine XR (EFFEXOR-XR) 75 MG 24 hr capsule  5. Insomnia due to other mental disorder F51.05 traZODone (DESYREL) 150 MG tablet   F99       Past Psychiatric History:  Anxiety:Yes Bipolar  Disorder:No Depression:Yes Mania:No Psychosis:No Schizophrenia:No Personality Disorder:No Hospitalization for psychiatric illness:No History of Electroconvulsive Shock Therapy:No Prior Suicide Attempts:No  Past Medical History:  Past Medical History:  Diagnosis Date  . Anxiety   . Arthritis   . DEPRESSION 08/26/2008  . Hx of colonic polyps    First noted on  colonoscopy 2012  . Hypercholesteremia   . HYPERTENSION 08/26/2008  . HYPOTHYROIDISM 08/26/2008  . Obesity   . Osteoporosis   . Substance abuse    inpatient and outpatient tx for substance abuse  . TOBACCO USER 12/25/2009    Past Surgical History:  Procedure Laterality Date  . [place] SURGERY  2006  . CESAREAN SECTION    . COLON RESECTION N/A 05/09/2014   Procedure: LAPAROSCOPIC HAND-ASSISTED EXTENDED RIGHT COLECTOMY, LAPAROSCOPIC LYSIS OF ADHESIONS, SPLENIC FLEXURE MOBILIZATION.;  Surgeon: [name], MD;  Location: [facility];  Service: General;  Laterality: N/A;  . DILATION AND CURETTAGE OF UTERUS     x2  . FOREARM FRACTURE SURGERY     right  . GASTRIC BYPASS    . TONSILLECTOMY      Family Psychiatric and Medical History:  Family History  Problem Relation Age of Onset  . Cancer Mother        colon  . Dementia Mother   . Depression Mother   . Heart disease Father 51  . Heart disease Sister 79       stents, pacemaker  . Depression Sister   . Heart disease  Brother 38       CABG, MI  . Depression Brother   . Obesity Daughter   . Suicidality Daughter   . Bipolar disorder Daughter   . Obesity Son   . Mental illness Daughter        bipolar  . Obesity Daughter   . Depression Sister   . Dementia Paternal Aunt   . Alcohol abuse Maternal Uncle   . Colon cancer Maternal Aunt   . Stomach cancer Maternal Grandfather     Social History:  Social History   Social History  . Marital status: Married    Spouse name: N/A  . Number of children: 3  . Years of education: N/A   Occupational History  .  [employer]   Social History Main Topics  . Smoking status: Current Every Day Smoker    Packs/day: 1.00    Years: 50.00    Types: Cigarettes  . Smokeless tobacco: Never Used  . Alcohol use No     Comment: quit on Aug 27, 2015  . Drug use: No     Comment: last used May 2017  . Sexual activity: Not Currently    Partners: Male    Birth control/ protection: Post-menopausal   Other Topics Concern  . Not on file   Social History Narrative   Ms. Bielak lives in [place] with her husband. She has 3 grown children and several grand children. She has joint custody of 3 of her grand children as her daughter has bipolar disorder and has needed assistance with children in past.    Allergies:  Allergies  Allergen Reactions  . Morphine Sulfate     Swelling around injection area    Metabolic Disorder Labs: Lab Results  Component Value Date   HGBA1C 5.2 12/06/2016   MPG 111 05/10/2014   No results found for: PROLACTIN Lab Results  Component Value Date   CHOL 164 12/06/2016   TRIG 95.0 12/06/2016   HDL 70.50 12/06/2016   CHOLHDL 2 12/06/2016   VLDL 19.0 12/06/2016   LDLCALC 74 12/06/2016   LDLCALC 82 10/21/2015   Lab Results  Component Value Date   TSH 0.65 12/06/2016   TSH 2.24 01/22/2016    Therapeutic Level Labs: No results found for: LITHIUM No results found for: VALPROATE No components found for:  CBMZ  Current Medications: Current Outpatient Prescriptions  Medication Sig Dispense Refill  . albuterol (PROVENTIL HFA;VENTOLIN HFA) 108 (90 Base) MCG/ACT inhaler Inhale 2 puffs into the lungs every 6 (six) hours as needed for wheezing or shortness of breath. 1 Inhaler 2  . alendronate (FOSAMAX) 70 MG tablet Take 1 tablet (70 mg total) by mouth once a week. Reported on 10/06/2015 4 tablet 11  . aspirin 81 MG tablet Take 81 mg by mouth daily.    . budesonide-formoterol (SYMBICORT) 160-4.5 MCG/ACT inhaler Inhale 2 puffs into the lungs 2 (two) times daily.  1 Inhaler 6  . buPROPion (WELLBUTRIN SR) 150 MG 12 hr tablet Take 1 tablet (150 mg total) by mouth 2 (two) times daily. 180 tablet 1  . busPIRone (BUSPAR) 10 MG tablet Take 1 tablet (10 mg total) by mouth 3 (three) times daily. 90 tablet 0  . Cyanocobalamin (VITAMIN B-12 PO) Take 1 tablet by mouth daily.    . ferrous fumarate (HEMOCYTE - 106 MG FE) 325 (106 FE) MG TABS Take 1 tablet by mouth daily. Reported on 10/06/2015    . levothyroxine (SYNTHROID, LEVOTHROID) 100 MCG tablet Take  1 tablet (100 mcg total) by mouth daily before breakfast. 90 tablet 3  . lisinopril (PRINIVIL,ZESTRIL) 10 MG tablet Take 1 tablet (10 mg total) by mouth daily. 90 tablet 1  . Magnesium 250 MG TABS Take 250 mg by mouth 2 (two) times daily.     . simvastatin (ZOCOR) 10 MG tablet Take 1 tablet (10 mg total) by mouth at bedtime. 90 tablet 3  . traZODone (DESYREL) 150 MG tablet Take 1 tablet (150 mg total) by mouth at bedtime. 30 tablet 0  . venlafaxine XR (EFFEXOR-XR) 75 MG 24 hr capsule TAKE ONE CAPSULE BY MOUTH EVERY DAY WITH BREAKFAST 90 capsule 1   No current facility-administered medications for this visit.      Musculoskeletal: Strength & Muscle Tone: within normal limits Gait & Station: normal Patient leans: N/A  Psychiatric Specialty Exam: Review of Systems  Gastrointestinal: Negative for abdominal pain, constipation, diarrhea, nausea and vomiting.  Neurological: Negative for dizziness, tremors, sensory change and headaches.  Psychiatric/Behavioral: Negative for depression, hallucinations, substance abuse and suicidal ideas. The patient is nervous/anxious. The patient does not have insomnia.     Blood pressure 126/78, pulse 80, height 5' 3.75" (1.619 m), weight 171 lb 3.2 oz (77.7 kg).Body mass index is 29.62 kg/m.  General Appearance: Fairly Groomed  Eye Contact:  Good  Speech:  Clear and Coherent and Normal Rate  Volume:  Normal  Mood:  Euthymic  Affect:  Full Range  Thought Process:  Goal Directed  and Descriptions of Associations: Intact  Orientation:  Full (Time, Place, and Person)  Thought Content: Logical   Suicidal Thoughts:  No  Homicidal Thoughts:  No  Memory:  Immediate;   Good Recent;   Good Remote;   Good  Judgement:  Good  Insight:  Good  Psychomotor Activity:  Normal  Concentration:  Concentration: Good and Attention Span: Good  Recall:  Good  Fund of Knowledge: Good  Language: Good  Akathisia:  No  Handed:  Right  AIMS (if indicated): not done  Assets:  Communication Skills Desire for Improvement Financial Resources/Insurance Housing Intimacy Leisure Time [place] Talents/Skills Transportation Vocational/Educational  ADL's:  Intact  Cognition: WNL  Sleep:  Good   Screenings: AUDIT     Counselor from 09/10/2015 in House  Alcohol Use Disorder Identification Test Final Score (AUDIT)  34    CAGE-AID     Counselor from 09/10/2015 in [facility] Score  4    GAD-7     Counselor from 09/14/2015 in [place]  Total GAD-7 Score  7    PHQ2-9     Office Visit from 12/12/2016 in [facility] from 11/05/2015 in [facility] Office Visit from 10/16/2015 in [facility] from 09/10/2015 in [facility] Counselor from 05/20/2015 in [facility]  PHQ-2 Total Score  2  2  0  2  6  PHQ-9 Total Score  6  9  -  9  13       Assessment and Plan: MDD-recurrent, moderate; GAD; Insomnia; Alcohol use disorder in remission; Nicotine dep   Medication management with supportive therapy. Risks/benefits and SE of the medication discussed. Pt verbalized understanding and verbal consent obtained for treatment.  Affirm with the patient that the medications are taken  as ordered. Patient expressed understanding of how their medications  were to be used.   Meds:  Effexor XR 75mg  po qD for depression and anxiety Wellbutrin SR 150mg  po BID for depression. It is not helping with smoking cessation Trazodone 150mg  po qHS for sleep Buspar 10mg  po TID for anxiety   Labs: none  Therapy: brief supportive therapy provided. Discussed psychosocial stressors in detail.     Consultations:none  Pt denies SI and is at an acute low risk for suicide. Patient told to call clinic if any problems occur. Patient advised to go to ER if they should develop SI/HI, side effects, or if symptoms worsen. Has crisis numbers to call if needed. Pt verbalized understanding.  F/up in 3 months or sooner if needed    [name], MD 01/05/2017, 9:53 AM

## 2017-01-23 ENCOUNTER — Encounter: Payer: Self-pay | Admitting: *Deleted

## 2017-01-23 ENCOUNTER — Ambulatory Visit
Admission: RE | Admit: 2017-01-23 | Discharge: 2017-01-23 | Disposition: A | Discharge status: Home / Self Care | Payer: Medicare Other | Source: Ambulatory Visit | Attending: Family Medicine | Admitting: Family Medicine

## 2017-01-23 DIAGNOSIS — E2839 Other primary ovarian failure: Secondary | ICD-10-CM

## 2017-01-23 DIAGNOSIS — Z1239 Encounter for other screening for malignant neoplasm of breast: Secondary | ICD-10-CM

## 2017-02-16 ENCOUNTER — Ambulatory Visit (INDEPENDENT_AMBULATORY_CARE_PROVIDER_SITE_OTHER): Payer: Medicare Other | Admitting: Family Medicine

## 2017-02-16 ENCOUNTER — Encounter: Payer: Self-pay | Admitting: Family Medicine

## 2017-02-16 VITALS — BP 113/77 | HR 72 | Temp 98.5°F | Resp 20 | Wt 170.8 lb

## 2017-02-16 DIAGNOSIS — R591 Generalized enlarged lymph nodes: Secondary | ICD-10-CM | POA: Insufficient documentation

## 2017-02-16 DIAGNOSIS — F17200 Nicotine dependence, unspecified, uncomplicated: Secondary | ICD-10-CM

## 2017-02-16 DIAGNOSIS — Z23 Encounter for immunization: Secondary | ICD-10-CM | POA: Diagnosis not present

## 2017-02-16 DIAGNOSIS — R220 Localized swelling, mass and lump, head: Secondary | ICD-10-CM | POA: Insufficient documentation

## 2017-02-16 DIAGNOSIS — R222 Localized swelling, mass and lump, trunk: Secondary | ICD-10-CM | POA: Insufficient documentation

## 2017-02-16 LAB — CBC WITH DIFFERENTIAL/PLATELET
Basophils Absolute: 0 10*3/uL (ref 0.0–0.1)
Basophils Relative: 0.5 % (ref 0.0–3.0)
Eosinophils Absolute: 0 10*3/uL (ref 0.0–0.7)
Eosinophils Relative: 0.5 % (ref 0.0–5.0)
HCT: 42.5 % (ref 36.0–46.0)
Hemoglobin: 14 g/dL (ref 12.0–15.0)
Lymphocytes Relative: 13.9 % (ref 12.0–46.0)
Lymphs Abs: 1 10*3/uL (ref 0.7–4.0)
MCHC: 32.9 g/dL (ref 30.0–36.0)
MCV: 96.8 fl (ref 78.0–100.0)
Monocytes Absolute: 0.5 10*3/uL (ref 0.1–1.0)
Monocytes Relative: 7.5 % (ref 3.0–12.0)
Neutro Abs: 5.4 10*3/uL (ref 1.4–7.7)
Neutrophils Relative %: 77.6 % — ABNORMAL HIGH (ref 43.0–77.0)
Platelets: 305 10*3/uL (ref 150.0–400.0)
RBC: 4.39 Mil/uL (ref 3.87–5.11)
RDW: 13.1 % (ref 11.5–15.5)
WBC: 7 10*3/uL (ref 4.0–10.5)

## 2017-02-16 LAB — SEDIMENTATION RATE: Sed Rate: 32 mm/hr — ABNORMAL HIGH (ref 0–30)

## 2017-02-16 LAB — C-REACTIVE PROTEIN: CRP: 2.3 mg/dL (ref 0.5–20.0)

## 2017-02-16 MED ORDER — PREDNISONE 20 MG PO TABS: ORAL_TABLET | ORAL | 0 refills | Status: DC

## 2017-02-16 NOTE — Progress Notes (Signed)
Kendra Ball , 24-Jun-1945, 71 y.o., female MRN: [id number] Patient Care Team    Relationship Specialty Notifications Start End  Ma Hillock, DO PCP - General Family Medicine  12/30/14   Juanita Craver, MD Consulting Physician Gastroenterology  05/09/14   Excell Seltzer, MD Consulting Physician General Surgery  05/09/14   Brandon Melnick, [place]  Psychology  10/16/15   [name], MD  Psychiatry  12/12/16     Chief Complaint  Patient presents with  . Adenopathy    neck area     Subjective: Pt presents for an OV with complaints of Lump in her neck of one week duration.  Patient reports she noticed about one week ago a lump on the right side of her neck above her clavicle. She states this was a rather large not about the size of golf ball. It has gone down over the last few days, but since it has gone down she also noticed the smaller lump next to it. She reports she noticed 2 weeks ago a lump on the back of her head, this is also mildly improved however remains. She denies fever, chills, night sweats, insect bites, recent illness any injury or infection of the head or neck. Her mammogram was completed 2018 and normal. Her colonoscopy is up-to-date. She is a current every day smoker. She does have a right lower lobe lung nodule that has been closely monitored, last CT chest 11/07/2016. Lab work collected 12/06/2016 was within normal CBC.   Depression screen [facility] 2/9 02/16/2017 12/12/2016 10/16/2015 09/10/2014 08/13/2013  Decreased Interest 0 2 0 0 3  Down, Depressed, Hopeless 0 0 0 0 3  PHQ - 2 Score 0 2 0 0 6  Altered sleeping - 0 - - -  Tired, decreased energy - 3 - - -  Change in appetite - 0 - - -  Feeling bad or failure about yourself  - 1 - - -  Trouble concentrating - 0 - - -  Moving slowly or fidgety/restless - 0 - - -  Suicidal thoughts - 0 - - -  PHQ-9 Score - 6 - - -  Difficult doing work/chores - Not difficult at all - - -  Some encounter information is confidential and  restricted. Go to Review Flowsheets activity to see all data.    Allergies  Allergen Reactions  . Morphine Sulfate     Swelling around injection area   Social History  Substance Use Topics  . Smoking status: Current Every Day Smoker    Packs/day: 1.00    Years: 50.00    Types: Cigarettes  . Smokeless tobacco: Never Used  . Alcohol use No     Comment: quit on Aug 27, 2015   Past Medical History:  Diagnosis Date  . Anxiety   . Arthritis   . DEPRESSION 08/26/2008  . Hx of colonic polyps    First noted on  colonoscopy 2012  . Hypercholesteremia   . HYPERTENSION 08/26/2008  . HYPOTHYROIDISM 08/26/2008  . Obesity   . Osteoporosis   . Substance abuse [facility])    inpatient and outpatient tx for substance abuse  . TOBACCO USER 12/25/2009   Past Surgical History:  Procedure Laterality Date  . [place] SURGERY  2006  . CESAREAN SECTION    . COLON RESECTION N/A 05/09/2014   Procedure: LAPAROSCOPIC HAND-ASSISTED EXTENDED RIGHT COLECTOMY, LAPAROSCOPIC LYSIS OF ADHESIONS, SPLENIC FLEXURE MOBILIZATION.;  Surgeon: [name], MD;  Location: [facility];  Service: General;  Laterality:  N/A;  . DILATION AND CURETTAGE OF UTERUS     x2  . FOREARM FRACTURE SURGERY     right  . GASTRIC BYPASS    . TONSILLECTOMY     Family History  Problem Relation Age of Onset  . Cancer Mother        colon  . Dementia Mother   . Depression Mother   . Heart disease Father 74  . Heart disease Sister 46       stents, pacemaker  . Depression Sister   . Heart disease Brother 36       CABG, MI  . Depression Brother   . Obesity Daughter   . Suicidality Daughter   . Bipolar disorder Daughter   . Obesity Son   . Mental illness Daughter        bipolar  . Obesity Daughter   . Depression Sister   . Dementia Paternal Aunt   . Alcohol abuse Maternal Uncle   . Colon cancer Maternal Aunt   . Stomach cancer Maternal Grandfather    Allergies as of 02/16/2017      Reactions   Morphine Sulfate    Swelling around  injection area      Medication List       Accurate as of 02/16/17 10:58 AM. Always use your most recent med list.          albuterol 108 (90 Base) MCG/ACT inhaler Commonly known as:  PROVENTIL HFA;VENTOLIN HFA Inhale 2 puffs into the lungs every 6 (six) hours as needed for wheezing or shortness of breath.   alendronate 70 MG tablet Commonly known as:  FOSAMAX Take 1 tablet (70 mg total) by mouth once a week. Reported on 10/06/2015   aspirin 81 MG tablet Take 81 mg by mouth daily.   budesonide-formoterol 160-4.5 MCG/ACT inhaler Commonly known as:  SYMBICORT Inhale 2 puffs into the lungs 2 (two) times daily.   buPROPion 150 MG 12 hr tablet Commonly known as:  WELLBUTRIN SR Take 1 tablet (150 mg total) by mouth 2 (two) times daily.   busPIRone 10 MG tablet Commonly known as:  BUSPAR Take 1 tablet (10 mg total) by mouth 3 (three) times daily.   ferrous fumarate 325 (106 Fe) MG Tabs tablet Commonly known as:  HEMOCYTE - 106 mg FE Take 1 tablet by mouth daily. Reported on 10/06/2015   levothyroxine 100 MCG tablet Commonly known as:  SYNTHROID, LEVOTHROID Take 1 tablet (100 mcg total) by mouth daily before breakfast.   lisinopril 10 MG tablet Commonly known as:  PRINIVIL,ZESTRIL Take 1 tablet (10 mg total) by mouth daily.   Magnesium 250 MG Tabs Take 250 mg by mouth 2 (two) times daily.   simvastatin 10 MG tablet Commonly known as:  ZOCOR Take 1 tablet (10 mg total) by mouth at bedtime.   traZODone 150 MG tablet Commonly known as:  DESYREL Take 1 tablet (150 mg total) by mouth at bedtime.   venlafaxine XR 75 MG 24 hr capsule Commonly known as:  EFFEXOR-XR TAKE ONE CAPSULE BY MOUTH EVERY DAY WITH BREAKFAST   VITAMIN B-12 PO Take 1 tablet by mouth daily.       All past medical history, surgical history, allergies, family history, immunizations andmedications were updated in the EMR today and reviewed under the history and medication portions of their EMR.      ROS: Negative, with the exception of above mentioned in HPI   Objective:  BP 113/77 (BP Location: Left Arm, Patient  Position: Sitting, Cuff Size: Normal)   Pulse 72   Temp 98.5 F (36.9 C)   Resp 20   Wt 170 lb 12 oz (77.5 kg)   SpO2 96%   BMI 29.54 kg/m  Body mass index is 29.54 kg/m. Gen: Afebrile. No acute distress. Nontoxic in appearance, well developed, well nourished.  HENT: AT. [place]. Bilateral TM visualized Without erythema or bulging. Normal anatomy. No masses. Normal external auditory meatus.. MMM, no oral lesions. Bilateral nares mild erythema. Throat without erythema or exudates. No cough, no hoarseness. Eyes:Pupils Equal Round Reactive to light, Extraocular movements intact,  Conjunctiva without redness, discharge or icterus. Neck/lymp: Supple, right occipital mass 2 cm x 2 cm with smaller mass inferior 0.5 cm x 0.5 cm, nontender, mobile. Right supraclavicular mass 2 cm x 3 cm with smaller 0.5 cm x 0.5 cm mass beneath. Also nontender and mobile. No axillary node lymphadenopathy or cervical lymphadenopathy palpated. CV: RRR  Chest: CTAB, no wheeze or crackles. Good air movement, normal resp effort.  Skin: No rashes rashes, purpura or petechiae. Scalp inspected and skin is intact without any lesions. Neuro:  Normal gait. PERLA. EOMi. Alert. Oriented x3   No exam data present No results found. No results found for this or any previous visit (from the past 24 hour(s)).  Assessment/Plan: Kendra Ball is a 71 y.o. female present for OV for  Influenza vaccine administered - Flu vaccine HIGH DOSE PF (Fluzone High dose) Lymphadenopathy/current every day smoker Occipital mass/supraclavicular mass - Certainly have concerns for malignancy in a every day smoking patient with known right lower lobe lung nodule now with lymphadenopathy in the right supraclavicular area for either throat or lung malignancy. Although she reports they headache with onset over the last 2 weeks and  seemed to be at least mildly improving. She had no signs or symptoms of any type of infections or skin lesions. - We'll start with ultrasound evaluation, prednisone taper. CBC, ESR and CRP today - predniSONE (DELTASONE) 20 MG tablet; 60 mg x3d, 40 mg x3d, 20 mg x2d, 10 mg x2d  Dispense: 18 tablet; Refill: 0 - CBC w/Diff - Sedimentation rate - C-reactive protein - f/u 2 weeks   Reviewed expectations re: course of current medical issues.  Discussed self-management of symptoms.  Outlined signs and symptoms indicating need for more acute intervention.  Patient verbalized understanding and all questions were answered.  Patient received an After-Visit Summary.    Orders Placed This Encounter  Procedures  . Flu vaccine HIGH DOSE PF (Fluzone High dose)     Note is dictated utilizing voice recognition software. Although note has been proof read prior to signing, occasional typographical errors still can be missed. If any questions arise, please do not hesitate to call for verification.   electronically signed by:  Howard Pouch, DO  [place]

## 2017-02-16 NOTE — Patient Instructions (Signed)
Start prednisone tomorrow.  They will call to schedule your ultrasound.  Please stop smoking.  Follow up in 2 weeks.   Lymphadenopathy Lymphadenopathy refers to swollen or enlarged lymph glands, also called lymph nodes. Lymph glands are part of your body's defense (immune) system, which protects the body from infections, germs, and diseases. Lymph glands are found in many locations in your body, including the neck, underarm, and groin. Many things can cause lymph glands to become enlarged. When your immune system responds to germs, such as viruses or bacteria, infection-fighting cells and fluid build up. This causes the glands to grow in size. Usually, this is not something to worry about. The swelling and any soreness often go away without treatment. However, swollen lymph glands can also be caused by a number of diseases. Your health care provider may do various tests to help determine the cause. If the cause of your swollen lymph glands cannot be found, it is important to monitor your condition to make sure the swelling goes away. Follow these instructions at home: Watch your condition for any changes. The following actions may help to lessen any discomfort you are feeling:  Get plenty of rest.  Take medicines only as directed by your health care provider. Your health care provider may recommend over-the-counter medicines for pain.  Apply moist heat compresses to the site of swollen lymph nodes as directed by your health care provider. This can help reduce any pain.  Check your lymph nodes daily for any changes.  Keep all follow-up visits as directed by your health care provider. This is important.  Contact a health care provider if:  Your lymph nodes are still swollen after 2 weeks.  Your swelling increases or spreads to other areas.  Your lymph nodes are hard, seem fixed to the skin, or are growing rapidly.  Your skin over the lymph nodes is red and inflamed.  You have a  fever.  You have chills.  You have fatigue.  You develop a sore throat.  You have abdominal pain.  You have weight loss.  You have night sweats. Get help right away if:  You notice fluid leaking from the area of the enlarged lymph node.  You have severe pain in any area of your body.  You have chest pain.  You have shortness of breath. This information is not intended to replace advice given to you by your health care provider. Make sure you discuss any questions you have with your health care provider. Document Released: 01/19/2008 Document Revised: 09/17/2015 Document Reviewed: 11/14/2013 Elsevier Interactive Patient Education  [employer].

## 2017-02-18 ENCOUNTER — Ambulatory Visit (HOSPITAL_BASED_OUTPATIENT_CLINIC_OR_DEPARTMENT_OTHER)
Admission: RE | Admit: 2017-02-18 | Discharge: 2017-02-18 | Disposition: A | Discharge status: Home / Self Care | Payer: Medicare Other | Source: Ambulatory Visit | Attending: Family Medicine | Admitting: Family Medicine

## 2017-02-18 DIAGNOSIS — F17200 Nicotine dependence, unspecified, uncomplicated: Secondary | ICD-10-CM

## 2017-02-18 DIAGNOSIS — R591 Generalized enlarged lymph nodes: Secondary | ICD-10-CM | POA: Diagnosis present

## 2017-02-18 DIAGNOSIS — R220 Localized swelling, mass and lump, head: Secondary | ICD-10-CM | POA: Insufficient documentation

## 2017-02-18 DIAGNOSIS — R222 Localized swelling, mass and lump, trunk: Secondary | ICD-10-CM | POA: Diagnosis present

## 2017-02-20 ENCOUNTER — Telehealth: Payer: Self-pay | Admitting: Family Medicine

## 2017-02-20 DIAGNOSIS — R591 Generalized enlarged lymph nodes: Secondary | ICD-10-CM

## 2017-02-20 DIAGNOSIS — F17200 Nicotine dependence, unspecified, uncomplicated: Secondary | ICD-10-CM

## 2017-02-20 DIAGNOSIS — R590 Localized enlarged lymph nodes: Secondary | ICD-10-CM

## 2017-02-20 DIAGNOSIS — R911 Solitary pulmonary nodule: Secondary | ICD-10-CM

## 2017-02-20 NOTE — Telephone Encounter (Signed)
Called pt and discussed her [place] head/neck results indicating further workup need and ENT referral for her lymphadenopathy. This order has been placed urgently.

## 2017-02-22 ENCOUNTER — Encounter (HOSPITAL_BASED_OUTPATIENT_CLINIC_OR_DEPARTMENT_OTHER): Payer: Self-pay | Admitting: *Deleted

## 2017-02-22 DIAGNOSIS — R590 Localized enlarged lymph nodes: Secondary | ICD-10-CM | POA: Insufficient documentation

## 2017-02-22 NOTE — H&P (Signed)
Otolaryngology Clinic Note  HPI:    Kendra Ball is a 71 y.o. female patient of Isidore Moos, DO for evaluation of right neck masses.  Between 1 and 2 weeks ago, she noticed a right supraclavicular mass which was "golf ball" sized.  There was no overlying skin erythema.  It got slightly smaller and the skin now looks normal.  No obvious antecedent history or lesions in the vicinity.  No skin or scalp lesions.  No skin infections, animal or insect bites or scratches.  No symptoms or lesions in the mouth or throat.  She is a 1 pack/day x 50 years smoker.  As the lump got smaller, she noticed some additional lumps in the right occipital area, and the right supraclavicular area.  A CT scan of the chest 4 months ago showed some calcified nodules in the subpleural nodule in the right upper lobe.  These were not felt to represent malignancy.  No history of cancer.  No constitutional symptoms including fevers or sweats, change in weight, appetite, or energy.  No other lumps in the opposite neck, axillae, or groins.  An ultrasound of the neck showed several right sided occipital and supraclavicular nodes. PMH/Meds/All/SocHx/FamHx/ROS:   Past Medical History      Past Medical History:  Diagnosis Date  . High cholesterol   . Hypertension   . Hyperthyroidism   . Osteoporosis       Past Surgical History       Past Surgical History:  Procedure Laterality Date  . BARIATRIC SURGERY    . broken arm    . CESAREAN SECTION    . COLECTOMY    . STOMACH SURGERY    . TONSILLECTOMY    . TUBAL LIGATION        No family history of bleeding disorders, wound healing problems or difficulty with anesthesia.   Social History  Social History        Social History  . Marital status: Married    Spouse name: N/A  . Number of children: N/A  . Years of education: N/A      Occupational History  . Not on file.       Social History Main Topics  . Smoking  status: Current Every Day Smoker  . Smokeless tobacco: Never Used  . Alcohol use Not on file  . Drug use: Unknown  . Sexual activity: Not on file       Other Topics Concern  . Not on file      Social History Narrative  . No narrative on file       Current Outpatient Prescriptions:  .  albuterol 90 mcg/actuation inhaler, Inhale 2 puffs into the lungs., Disp: , Rfl:  .  alendronate (FOSAMAX) 70 MG tablet, Take by mouth., Disp: , Rfl:  .  aspirin-calcium carbonate 81 mg-300 mg calcium(777 mg) Tab tablet *ANTIPLATELET*, Take by mouth., Disp: , Rfl:  .  budesonide-formoterol (SYMBICORT) 160-4.5 mcg/actuation inhaler, Inhale 2 puffs into the lungs., Disp: , Rfl:  .  buPROPion SR (WELLBUTRIN SR) 150 MG 12 hr tablet, Take by mouth., Disp: , Rfl:  .  busPIRone (BUSPAR) 10 MG tablet, Take by mouth., Disp: , Rfl:  .  cyanocobalamin, vitamin B-12, (VITAMIN B-12 ORAL), Take 1 tablet by mouth., Disp: , Rfl:  .  ferrous fumarate 325 mg (106 mg iron) Tab, Take 1 tablet by mouth., Disp: , Rfl:  .  levothyroxine (SYNTHROID, LEVOTHROID) 100 MCG tablet, Take 100 mcg by mouth., Disp: ,  Rfl:  .  lisinopril (PRINIVIL,ZESTRIL) 10 MG tablet, Take by mouth., Disp: , Rfl:  .  magnesium 250 mg Tab, Take by mouth., Disp: , Rfl:  .  simvastatin (ZOCOR) 10 MG tablet, Take by mouth., Disp: , Rfl:  .  traZODone (DESYREL) 150 MG tablet, Take by mouth., Disp: , Rfl:  .  venlafaxine (EFFEXOR-XR) 75 MG 24 hr capsule, TAKE ONE CAPSULE BY MOUTH EVERY DAY WITH BREAKFAST, Disp: , Rfl:   A complete ROS was performed with pertinent positives/negatives noted in the HPI. The remainder of the ROS are negative.    Physical Exam:    BP 101/56 (Site: Left arm)   Pulse 65   Ht 1.626 m (5\' 4" )   Wt 78 kg (172 lb)   BMI 29.52 kg/m  She is pleasant and animated.  She appears healthy.  She smells of tobacco smoke.  Mental status is sharp.  She hears well in conversational speech.  The head is atraumatic and neck  supple.  Cranial nerves intact.  Ear canals are clear with normal drums.  Anterior nose is slightly dry and excoriated consistent with her smoking.  Oral cavity reveals teeth in fairly good repair with no lesions.  Oropharynx is clear.  Neck with some moderately firm 1 cm occipital nodes on the right side, and several mobile rubbery firm supraclavicular nodes of varying sizes.  The skin of the face, neck, and scalp looks clear.  Lungs: Clear to auscultation Heart: Regular rate and rhythm without murmurs Abdomen: Soft, active Extremities: Normal configuration Neurologic: Symmetric, grossly intact.  Using the flexible laryngoscope, the nasopharynx is clear with normal eustachian tori.  Oropharynx is clear.  Hypopharynx/larynx shows mobile vocal cords with good airway.  No erythema or swelling.  No pooling in valleculae or piriforms.  No base of tongue masses or lesions.    Flexible Laryngoscopy   Indications were discussed.  Details of the procedure were explained.  Questions were answered and informed consent was obtained verbally.    Technique:  After anesthetizing the nasal cavity with topical lidocaine and oxymetazoline, the flexible endoscope was introduced and passed through the right nasal cavity into the nasopharynx. The scope was withdrawn from the nose. She tolerated the procedure well.    Impression & Plans:   Right neck adenopathy including occipital and supraclavicular nodes.  These could be accessory Virchow's nodes or could be lymphoma.  Plan: I would like to do an excisional biopsy of one or several of the right supraclavicular nodes in the near future.  I discussed this procedure with her including  risks and complications.  Questions were answered and informed consent was obtained.  No postoperative prescriptions.  I will see her back 2 weeks postop.  Lilyan Gilford, MD  [id number]

## 2017-02-23 ENCOUNTER — Encounter (HOSPITAL_BASED_OUTPATIENT_CLINIC_OR_DEPARTMENT_OTHER)
Admission: RE | Admit: 2017-02-23 | Discharge: 2017-02-23 | Disposition: A | Discharge status: Home / Self Care | Payer: Medicare Other | Source: Ambulatory Visit | Attending: Otolaryngology | Admitting: Otolaryngology

## 2017-02-23 DIAGNOSIS — Z7983 Long term (current) use of bisphosphonates: Secondary | ICD-10-CM | POA: Diagnosis not present

## 2017-02-23 DIAGNOSIS — R221 Localized swelling, mass and lump, neck: Secondary | ICD-10-CM | POA: Diagnosis present

## 2017-02-23 DIAGNOSIS — R590 Localized enlarged lymph nodes: Secondary | ICD-10-CM | POA: Diagnosis not present

## 2017-02-23 DIAGNOSIS — E039 Hypothyroidism, unspecified: Secondary | ICD-10-CM | POA: Diagnosis not present

## 2017-02-23 DIAGNOSIS — I2510 Atherosclerotic heart disease of native coronary artery without angina pectoris: Secondary | ICD-10-CM | POA: Diagnosis not present

## 2017-02-23 DIAGNOSIS — Z7951 Long term (current) use of inhaled steroids: Secondary | ICD-10-CM | POA: Diagnosis not present

## 2017-02-23 DIAGNOSIS — E7800 Pure hypercholesterolemia, unspecified: Secondary | ICD-10-CM | POA: Diagnosis not present

## 2017-02-23 DIAGNOSIS — Z9884 Bariatric surgery status: Secondary | ICD-10-CM | POA: Diagnosis not present

## 2017-02-23 DIAGNOSIS — I10 Essential (primary) hypertension: Secondary | ICD-10-CM | POA: Diagnosis not present

## 2017-02-23 DIAGNOSIS — Z79899 Other long term (current) drug therapy: Secondary | ICD-10-CM | POA: Diagnosis not present

## 2017-02-23 DIAGNOSIS — F329 Major depressive disorder, single episode, unspecified: Secondary | ICD-10-CM | POA: Diagnosis not present

## 2017-02-23 DIAGNOSIS — F419 Anxiety disorder, unspecified: Secondary | ICD-10-CM | POA: Diagnosis not present

## 2017-02-23 DIAGNOSIS — J449 Chronic obstructive pulmonary disease, unspecified: Secondary | ICD-10-CM | POA: Diagnosis not present

## 2017-02-23 DIAGNOSIS — F17210 Nicotine dependence, cigarettes, uncomplicated: Secondary | ICD-10-CM | POA: Diagnosis not present

## 2017-02-23 NOTE — Progress Notes (Signed)
EKG reviewed by Dr. Lissa Hoard, will proceed with surgery as scheduled.

## 2017-02-24 ENCOUNTER — Ambulatory Visit (HOSPITAL_BASED_OUTPATIENT_CLINIC_OR_DEPARTMENT_OTHER)
Admission: RE | Admit: 2017-02-24 | Discharge: 2017-02-24 | Disposition: A | Discharge status: Home / Self Care | Payer: Medicare Other | Source: Ambulatory Visit | Attending: Otolaryngology | Admitting: Otolaryngology

## 2017-02-24 ENCOUNTER — Encounter (HOSPITAL_BASED_OUTPATIENT_CLINIC_OR_DEPARTMENT_OTHER): Payer: Self-pay | Admitting: Anesthesiology

## 2017-02-24 ENCOUNTER — Ambulatory Visit (HOSPITAL_BASED_OUTPATIENT_CLINIC_OR_DEPARTMENT_OTHER): Payer: Medicare Other | Admitting: Anesthesiology

## 2017-02-24 ENCOUNTER — Encounter (HOSPITAL_BASED_OUTPATIENT_CLINIC_OR_DEPARTMENT_OTHER)
Admission: RE | Disposition: A | Discharge status: Home / Self Care | Payer: Self-pay | Source: Ambulatory Visit | Attending: Otolaryngology

## 2017-02-24 DIAGNOSIS — F17210 Nicotine dependence, cigarettes, uncomplicated: Secondary | ICD-10-CM | POA: Diagnosis not present

## 2017-02-24 DIAGNOSIS — Z79899 Other long term (current) drug therapy: Secondary | ICD-10-CM | POA: Insufficient documentation

## 2017-02-24 DIAGNOSIS — I2510 Atherosclerotic heart disease of native coronary artery without angina pectoris: Secondary | ICD-10-CM | POA: Insufficient documentation

## 2017-02-24 DIAGNOSIS — R590 Localized enlarged lymph nodes: Secondary | ICD-10-CM | POA: Diagnosis not present

## 2017-02-24 DIAGNOSIS — E039 Hypothyroidism, unspecified: Secondary | ICD-10-CM | POA: Insufficient documentation

## 2017-02-24 DIAGNOSIS — I10 Essential (primary) hypertension: Secondary | ICD-10-CM | POA: Diagnosis not present

## 2017-02-24 DIAGNOSIS — F419 Anxiety disorder, unspecified: Secondary | ICD-10-CM | POA: Insufficient documentation

## 2017-02-24 DIAGNOSIS — R591 Generalized enlarged lymph nodes: Secondary | ICD-10-CM

## 2017-02-24 DIAGNOSIS — Z9884 Bariatric surgery status: Secondary | ICD-10-CM | POA: Insufficient documentation

## 2017-02-24 DIAGNOSIS — F329 Major depressive disorder, single episode, unspecified: Secondary | ICD-10-CM | POA: Insufficient documentation

## 2017-02-24 DIAGNOSIS — J449 Chronic obstructive pulmonary disease, unspecified: Secondary | ICD-10-CM | POA: Insufficient documentation

## 2017-02-24 DIAGNOSIS — E7800 Pure hypercholesterolemia, unspecified: Secondary | ICD-10-CM | POA: Diagnosis not present

## 2017-02-24 DIAGNOSIS — Z7983 Long term (current) use of bisphosphonates: Secondary | ICD-10-CM | POA: Insufficient documentation

## 2017-02-24 DIAGNOSIS — Z7951 Long term (current) use of inhaled steroids: Secondary | ICD-10-CM | POA: Insufficient documentation

## 2017-02-24 HISTORY — DX: Enlarged lymph nodes, unspecified: R59.9

## 2017-02-24 HISTORY — DX: Chronic obstructive pulmonary disease, unspecified: J44.9

## 2017-02-24 HISTORY — PX: MASS EXCISION: SHX2000

## 2017-02-24 HISTORY — DX: Generalized enlarged lymph nodes: R59.1

## 2017-02-24 SURGERY — EXCISION MASS
Anesthesia: General | Site: Neck | Laterality: Right

## 2017-02-24 MED ORDER — CHLORHEXIDINE GLUCONATE CLOTH 2 % EX PADS: 6.0000 | MEDICATED_PAD | Freq: Once | CUTANEOUS | Status: DC

## 2017-02-24 MED ORDER — LIDOCAINE HCL (CARDIAC) 20 MG/ML IV SOLN
INTRAVENOUS | Status: DC | PRN
  Administered 2017-02-24: 40 mg via INTRAVENOUS

## 2017-02-24 MED ORDER — LIDOCAINE-EPINEPHRINE 1 %-1:100000 IJ SOLN
INTRAMUSCULAR | Status: DC | PRN
  Administered 2017-02-24: 5 mL

## 2017-02-24 MED ORDER — SCOPOLAMINE 1 MG/3DAYS TD PT72: 1.0000 | MEDICATED_PATCH | Freq: Once | TRANSDERMAL | Status: DC | PRN

## 2017-02-24 MED ORDER — OXYCODONE HCL 5 MG PO TABS: 5.0000 mg | ORAL_TABLET | Freq: Once | ORAL | Status: DC | PRN

## 2017-02-24 MED ORDER — EPHEDRINE SULFATE 50 MG/ML IJ SOLN
INTRAMUSCULAR | Status: DC | PRN
  Administered 2017-02-24 (×2): 10 mg via INTRAVENOUS

## 2017-02-24 MED ORDER — PROPOFOL 10 MG/ML IV BOLUS
INTRAVENOUS | Status: DC | PRN
  Administered 2017-02-24: 100 mg via INTRAVENOUS

## 2017-02-24 MED ORDER — MIDAZOLAM HCL 2 MG/2ML IJ SOLN
INTRAMUSCULAR | Status: AC
  Filled 2017-02-24: qty 2

## 2017-02-24 MED ORDER — MEPERIDINE HCL 25 MG/ML IJ SOLN: 6.2500 mg | INTRAMUSCULAR | Status: DC | PRN

## 2017-02-24 MED ORDER — LACTATED RINGERS IV SOLN: INTRAVENOUS | Status: DC

## 2017-02-24 MED ORDER — DEXAMETHASONE SODIUM PHOSPHATE 4 MG/ML IJ SOLN
INTRAMUSCULAR | Status: DC | PRN
  Administered 2017-02-24: 10 mg via INTRAVENOUS

## 2017-02-24 MED ORDER — FENTANYL CITRATE (PF) 100 MCG/2ML IJ SOLN
INTRAMUSCULAR | Status: AC
  Filled 2017-02-24: qty 2

## 2017-02-24 MED ORDER — DEXAMETHASONE SODIUM PHOSPHATE 10 MG/ML IJ SOLN
INTRAMUSCULAR | Status: AC
  Filled 2017-02-24: qty 1

## 2017-02-24 MED ORDER — PROMETHAZINE HCL 25 MG/ML IJ SOLN: 6.2500 mg | INTRAMUSCULAR | Status: DC | PRN

## 2017-02-24 MED ORDER — HYDROMORPHONE HCL 1 MG/ML IJ SOLN: 0.2500 mg | INTRAMUSCULAR | Status: DC | PRN

## 2017-02-24 MED ORDER — FENTANYL CITRATE (PF) 100 MCG/2ML IJ SOLN
50.0000 ug | INTRAMUSCULAR | Status: DC | PRN
  Administered 2017-02-24: 25 ug via INTRAVENOUS
  Administered 2017-02-24: 50 ug via INTRAVENOUS

## 2017-02-24 MED ORDER — PROPOFOL 10 MG/ML IV BOLUS
INTRAVENOUS | Status: AC
  Filled 2017-02-24: qty 20

## 2017-02-24 MED ORDER — MIDAZOLAM HCL 2 MG/2ML IJ SOLN
1.0000 mg | INTRAMUSCULAR | Status: DC | PRN
  Administered 2017-02-24: 2 mg via INTRAVENOUS

## 2017-02-24 MED ORDER — OXYCODONE HCL 5 MG/5ML PO SOLN: 5.0000 mg | Freq: Once | ORAL | Status: DC | PRN

## 2017-02-24 MED ORDER — LACTATED RINGERS IV SOLN
INTRAVENOUS | Status: DC
  Administered 2017-02-24 (×2): via INTRAVENOUS

## 2017-02-24 MED ORDER — ONDANSETRON HCL 4 MG/2ML IJ SOLN
INTRAMUSCULAR | Status: AC
  Filled 2017-02-24: qty 2

## 2017-02-24 SURGICAL SUPPLY — 65 items
ADH SKN CLS APL DERMABOND .7 (GAUZE/BANDAGES/DRESSINGS) ×1
APL SKNCLS STERI-STRIP NONHPOA (GAUZE/BANDAGES/DRESSINGS)
ATTRACTOMAT 16X20 MAGNETIC DRP (DRAPES) IMPLANT
BANDAGE ACE 3X5.8 VEL STRL LF (GAUZE/BANDAGES/DRESSINGS) IMPLANT
BENZOIN TINCTURE PRP APPL 2/3 (GAUZE/BANDAGES/DRESSINGS) IMPLANT
BLADE SURG 15 STRL LF DISP TIS (BLADE) ×1 IMPLANT
BLADE SURG 15 STRL SS (BLADE) ×2
BNDG GAUZE ELAST 4 BULKY (GAUZE/BANDAGES/DRESSINGS) IMPLANT
CANISTER SUCT 1200ML W/VALVE (MISCELLANEOUS) ×2 IMPLANT
CORD BIPOLAR FORCEPS 12FT (ELECTRODE) ×2 IMPLANT
COVER BACK TABLE 60X90IN (DRAPES) ×2 IMPLANT
COVER MAYO STAND STRL (DRAPES) ×2 IMPLANT
DECANTER SPIKE VIAL GLASS SM (MISCELLANEOUS) IMPLANT
DERMABOND ADVANCED (GAUZE/BANDAGES/DRESSINGS) ×1
DERMABOND ADVANCED .7 DNX12 (GAUZE/BANDAGES/DRESSINGS) IMPLANT
DRAIN JACKSON RD 7FR 3/32 (WOUND CARE) IMPLANT
DRAIN PENROSE 1/4X12 LTX STRL (WOUND CARE) IMPLANT
DRAPE SURG 17X23 STRL (DRAPES) IMPLANT
DRAPE U-SHAPE 76X120 STRL (DRAPES) ×2 IMPLANT
ELECT COATED BLADE 2.86 ST (ELECTRODE) ×2 IMPLANT
ELECT PAIRED SUBDERMAL (MISCELLANEOUS)
ELECT REM PT RETURN 9FT ADLT (ELECTROSURGICAL) ×2
ELECTRODE PAIRED SUBDERMAL (MISCELLANEOUS) IMPLANT
ELECTRODE REM PT RTRN 9FT ADLT (ELECTROSURGICAL) ×1 IMPLANT
EVACUATOR SILICONE 100CC (DRAIN) IMPLANT
FORCEPS TISS BAYO ENTCEPS (INSTRUMENTS) ×1 IMPLANT
GAUZE SPONGE 4X4 12PLY STRL LF (GAUZE/BANDAGES/DRESSINGS) IMPLANT
GLOVE BIO SURGEON STRL SZ7 (GLOVE) ×1 IMPLANT
GLOVE ECLIPSE 8.0 STRL XLNG CF (GLOVE) ×2 IMPLANT
GOWN STRL REUS W/ TWL LRG LVL3 (GOWN DISPOSABLE) ×1 IMPLANT
GOWN STRL REUS W/ TWL XL LVL3 (GOWN DISPOSABLE) ×1 IMPLANT
GOWN STRL REUS W/TWL LRG LVL3 (GOWN DISPOSABLE) ×2
GOWN STRL REUS W/TWL XL LVL3 (GOWN DISPOSABLE) ×2
LOCATOR NERVE 3 VOLT (DISPOSABLE) IMPLANT
NDL HYPO 25X1 1.5 SAFETY (NEEDLE) ×1 IMPLANT
NEEDLE HYPO 25X1 1.5 SAFETY (NEEDLE) ×2 IMPLANT
NS IRRIG 1000ML POUR BTL (IV SOLUTION) ×2 IMPLANT
PACK BASIN DAY SURGERY FS (CUSTOM PROCEDURE TRAY) ×2 IMPLANT
PAD ALCOHOL SWAB (MISCELLANEOUS) ×2 IMPLANT
PENCIL BUTTON HOLSTER BLD 10FT (ELECTRODE) ×2 IMPLANT
PROBE NERVBE PRASS .33 (MISCELLANEOUS) IMPLANT
SHEET MEDIUM DRAPE 40X70 STRL (DRAPES) IMPLANT
SLEEVE SCD COMPRESS KNEE MED (MISCELLANEOUS) ×1 IMPLANT
SPONGE INTESTINAL PEANUT (DISPOSABLE) IMPLANT
STAPLER VISISTAT (STAPLE) IMPLANT
STRIP CLOSURE SKIN 1/2X4 (GAUZE/BANDAGES/DRESSINGS) IMPLANT
SUT CHROMIC 3 0 SH 27 (SUTURE) IMPLANT
SUT CHROMIC 4 0 P 3 18 (SUTURE) IMPLANT
SUT ETHILON 5 0 P 3 18 (SUTURE)
SUT ETHILON 5 0 PS 2 18 (SUTURE) IMPLANT
SUT NOVAFIL 5 0 BLK 18 IN P13 (SUTURE) IMPLANT
SUT NYLON ETHILON 5-0 P-3 1X18 (SUTURE) IMPLANT
SUT SILK 2 0 TIES 17X18 (SUTURE)
SUT SILK 2-0 18XBRD TIE BLK (SUTURE) IMPLANT
SUT SILK 3 0 TIES 17X18 (SUTURE) ×2
SUT SILK 3-0 18XBRD TIE BLK (SUTURE) IMPLANT
SUT SILK 4 0 TIES 17X18 (SUTURE) IMPLANT
SUT VIC AB 5-0 P-3 18X BRD (SUTURE) IMPLANT
SUT VIC AB 5-0 P3 18 (SUTURE)
SYR BULB 3OZ (MISCELLANEOUS) IMPLANT
SYR CONTROL 10ML LL (SYRINGE) ×2 IMPLANT
TOWEL OR 17X24 6PK STRL BLUE (TOWEL DISPOSABLE) ×4 IMPLANT
TRAY DSU PREP LF (CUSTOM PROCEDURE TRAY) ×2 IMPLANT
TUBE CONNECTING 20X1/4 (TUBING) ×2 IMPLANT
YANKAUER SUCT BULB TIP NO VENT (SUCTIONS) ×1 IMPLANT

## 2017-02-24 NOTE — Anesthesia Procedure Notes (Addendum)
Procedure Name: LMA Insertion Performed by: Maryella Shivers Pre-anesthesia Checklist: Patient identified, Emergency Drugs available, Suction available and Patient being monitored Patient Re-evaluated:Patient Re-evaluated prior to induction Oxygen Delivery Method: Circle system utilized Preoxygenation: Pre-oxygenation with 100% oxygen Induction Type: IV induction Ventilation: Mask ventilation without difficulty LMA: LMA inserted LMA Size: 4.0 Number of attempts: 1 Airway Equipment and Method: Bite block Placement Confirmation: positive ETCO2 Tube secured with: Tape Dental Injury: Teeth and Oropharynx as per pre-operative assessment

## 2017-02-24 NOTE — Op Note (Signed)
02/24/2017  9:40 AM    Davy Pique  [id number]   Pre-Op Dx:  Right supraclavicular lymphadenopathy  Post-op Dx: Same  Proc: Excision, right supraclavicular lymphadenopathy   Surg:  Tyson Alias MD  Anes:  GLMA  EBL:  Minimal  Comp:  None  Findings:  Relatively sparse supraclavicular fat pad with several firm matted lymph nodes.  Procedure: With the patient in a comfortable supine position, general LMA anesthesia was administered.  At an appropriate level, the patient was placed in a slight sitting position. Orienting initials were identified. A routine surgical timeout was obtained.  A chlorhexidine sterile preparation and draping of the right lower neck was accomplished.  Proposed incision site was infiltrated with 1% Xylocaine with 1 100,000 epinephrine, 5 mL's total. Several minutes were allowed for this to take effect.  A 3 cm transverse incision was sharply executed and carried down through skin into the subcutaneous fat. This was continued with Bovie cautery. The masses of concern were palpated. Blunt dissection was used to get down on the capsule of the mass. Dissection was carried around the mass with a hemostat, and also with the Microline thermal forceps. Branches of the transverse cervical artery and vein and possible lymphatic vessels were controlled with 3-0 silk ligature. The mass was excised, comprised of several matted lymph nodes. This was sent for lymphoma interpretation.  Hemostasis was observed. With Valsalva, there was no bleeding nor chyle leakage.  The wound was thoroughly irrigated.  The deep wound was closed  including obliteration of the dead space with 4-0 Vicryl suture.  The skin was closed in a cosmetic fashion using Dermabond.  At this point the procedure was completed. The patient was returned to anesthesia, awakened, extubated, and transferred to recovery in stable condition.  Dispo:   PACU to home  Plan:  Ice, elevation, analgesia.  Limited activity for 5 days. I will call with the pathology report. Return visit my office 2 weeks.  Tyson Alias MD

## 2017-02-24 NOTE — Transfer of Care (Signed)
Immediate Anesthesia Transfer of Care Note  Patient: Kendra Ball  Procedure(s) Performed: EXCISIONAL BIOPSY RIGHT SUPRA-CLAVICULAR NODE (Right Neck)  Patient Location: PACU  Anesthesia Type:General  Level of Consciousness: sedated  Airway & Oxygen Therapy: Patient Spontanous Breathing and Patient connected to face mask oxygen  Post-op Assessment: Report given to RN and Post -op Vital signs reviewed and stable  Post vital signs: Reviewed and stable  Last Vitals:  Vitals:   02/24/17 0739 02/24/17 0940  BP: (!) 108/46 (!) 120/56  Pulse: 63 66  Resp: 20 20  Temp: 36.6 C   SpO2: 96% 98%    Last Pain:  Vitals:   02/24/17 0739  TempSrc: Oral         Complications: No apparent anesthesia complications

## 2017-02-24 NOTE — Interval H&P Note (Signed)
History and Physical Interval Note:  02/24/2017 8:40 AM  Kendra Ball  has presented today for surgery, with the diagnosis of right neck adenopathy  The various methods of treatment have been discussed with the patient and family. After consideration of risks, benefits and other options for treatment, the patient has consented to  Procedure(s): EXCISIONAL BIOPSY RIGHT SUPRA-CLAVICULAR NODE (Right) as a surgical intervention .  The patient's history has been re-reviewed, patient re-examined, no change in status, stable for surgery.  I have re-reviewed the patient's chart and labs.  Questions were answered to the patient's satisfaction.     [name]

## 2017-02-24 NOTE — Anesthesia Preprocedure Evaluation (Addendum)
Anesthesia Evaluation  Patient identified by MRN, date of birth, ID band Patient awake    Reviewed: Allergy & Precautions, NPO status , Patient's Chart, lab work & pertinent test results  Airway Mallampati: I  TM Distance: >3 FB Neck ROM: Full    Dental  (+) Partial Lower, Missing,    Pulmonary COPD, Current Smoker,    breath sounds clear to auscultation       Cardiovascular hypertension, Pt. on medications + CAD   Rhythm:Regular Rate:Normal     Neuro/Psych PSYCHIATRIC DISORDERS Anxiety Depression negative neurological ROS     GI/Hepatic negative GI ROS, Neg liver ROS,   Endo/Other  Hypothyroidism   Renal/GU negative Renal ROS     Musculoskeletal  (+) Arthritis , Osteoarthritis,    Abdominal   Peds  Hematology negative hematology ROS (+)   Anesthesia Other Findings Day of surgery medications reviewed with the patient.  Reproductive/Obstetrics                            Anesthesia Physical Anesthesia Plan  ASA: III  Anesthesia Plan: General   Post-op Pain Management:    Induction: Intravenous  PONV Risk Score and Plan: 3 and Ondansetron, Dexamethasone, Midazolam and Treatment may vary due to age or medical condition  Airway Management Planned: LMA  Additional Equipment:   Intra-op Plan:   Post-operative Plan: Extubation in OR  Informed Consent: I have reviewed the patients History and Physical, chart, labs and discussed the procedure including the risks, benefits and alternatives for the proposed anesthesia with the patient or authorized representative who has indicated his/her understanding and acceptance.   Dental advisory given  Plan Discussed with: CRNA  Anesthesia Plan Comments:         Anesthesia Quick Evaluation

## 2017-02-24 NOTE — Anesthesia Postprocedure Evaluation (Signed)
Anesthesia Post Note  Patient: Kendra Ball  Procedure(s) Performed: EXCISIONAL BIOPSY RIGHT SUPRA-CLAVICULAR NODE (Right Neck)     Patient location during evaluation: PACU Anesthesia Type: General Level of consciousness: awake and alert Pain management: pain level controlled Vital Signs Assessment: post-procedure vital signs reviewed and stable Respiratory status: spontaneous breathing, nonlabored ventilation, respiratory function stable and patient connected to nasal cannula oxygen Cardiovascular status: blood pressure returned to baseline and stable Postop Assessment: no apparent nausea or vomiting Anesthetic complications: no    Last Vitals:  Vitals:   02/24/17 1015 02/24/17 1022  BP: (!) 100/48 (!) 99/43  Pulse: 71 62  Resp: 19 19  Temp:  37.2 C  SpO2: 99% 97%    Last Pain:  Vitals:   02/24/17 1022  TempSrc: Oral  PainSc:                  Effie Berkshire

## 2017-02-24 NOTE — Discharge Instructions (Signed)
Ice pack x 24 hrs as tolerated Sleep with head elevated 3-4 nights No need to put any ointment on wound.  We used skin glue to close this. You may trim loose edges as they peel up. OK to shower/bathe beginning tomorrow Recheck my office 2 weeks.  [phone] for an appointment I will call with the pathology report probably mid week next week. Call me for signs of bleeding, infection, excessive pain.  Same number as above.   Post Anesthesia Home Care Instructions  Activity: Get plenty of rest for the remainder of the day. A responsible individual must stay with you for 24 hours following the procedure.  For the next 24 hours, DO NOT: -Drive a car -Paediatric nurse -Drink alcoholic beverages -Take any medication unless instructed by your physician -Make any legal decisions or sign important papers.  Meals: Start with liquid foods such as gelatin or soup. Progress to regular foods as tolerated. Avoid greasy, spicy, heavy foods. If nausea and/or vomiting occur, drink only clear liquids until the nausea and/or vomiting subsides. Call your physician if vomiting continues.  Special Instructions/Symptoms: Your throat may feel dry or sore from the anesthesia or the breathing tube placed in your throat during surgery. If this causes discomfort, gargle with warm salt water. The discomfort should disappear within 24 hours.  If you had a scopolamine patch placed behind your ear for the management of post- operative nausea and/or vomiting:  1. The medication in the patch is effective for 72 hours, after which it should be removed.  Wrap patch in a tissue and discard in the trash. Wash hands thoroughly with soap and water. 2. You may remove the patch earlier than 72 hours if you experience unpleasant side effects which may include dry mouth, dizziness or visual disturbances. 3. Avoid touching the patch. Wash your hands with soap and water after contact with the patch.

## 2017-02-27 ENCOUNTER — Encounter (HOSPITAL_BASED_OUTPATIENT_CLINIC_OR_DEPARTMENT_OTHER): Payer: Self-pay | Admitting: Otolaryngology

## 2017-04-06 ENCOUNTER — Encounter (HOSPITAL_COMMUNITY): Payer: Self-pay | Admitting: Psychiatry

## 2017-04-06 ENCOUNTER — Ambulatory Visit (INDEPENDENT_AMBULATORY_CARE_PROVIDER_SITE_OTHER): Payer: Medicare Other | Admitting: Psychiatry

## 2017-04-06 DIAGNOSIS — F5105 Insomnia due to other mental disorder: Secondary | ICD-10-CM | POA: Diagnosis not present

## 2017-04-06 DIAGNOSIS — Z811 Family history of alcohol abuse and dependence: Secondary | ICD-10-CM

## 2017-04-06 DIAGNOSIS — F331 Major depressive disorder, recurrent, moderate: Secondary | ICD-10-CM

## 2017-04-06 DIAGNOSIS — Z818 Family history of other mental and behavioral disorders: Secondary | ICD-10-CM

## 2017-04-06 DIAGNOSIS — F411 Generalized anxiety disorder: Secondary | ICD-10-CM | POA: Diagnosis not present

## 2017-04-06 DIAGNOSIS — F17210 Nicotine dependence, cigarettes, uncomplicated: Secondary | ICD-10-CM

## 2017-04-06 DIAGNOSIS — F99 Mental disorder, not otherwise specified: Secondary | ICD-10-CM

## 2017-04-06 DIAGNOSIS — Z810 Family history of intellectual disabilities: Secondary | ICD-10-CM | POA: Diagnosis not present

## 2017-04-06 DIAGNOSIS — T65224A Toxic effect of tobacco cigarettes, undetermined, initial encounter: Secondary | ICD-10-CM

## 2017-04-06 DIAGNOSIS — F1020 Alcohol dependence, uncomplicated: Secondary | ICD-10-CM | POA: Diagnosis not present

## 2017-04-06 MED ORDER — BUPROPION HCL ER (SR) 150 MG PO TB12: 150.0000 mg | ORAL_TABLET | Freq: Two times a day (BID) | ORAL | 0 refills | Status: DC

## 2017-04-06 MED ORDER — BUSPIRONE HCL 10 MG PO TABS
10.0000 mg | ORAL_TABLET | Freq: Three times a day (TID) | ORAL | 0 refills | Status: DC
Start: 2017-04-06 — End: 2017-07-06

## 2017-04-06 MED ORDER — TRAZODONE HCL 150 MG PO TABS: 150.0000 mg | ORAL_TABLET | Freq: Every day | ORAL | 0 refills | Status: DC

## 2017-04-06 MED ORDER — VENLAFAXINE HCL ER 75 MG PO CP24: ORAL_CAPSULE | ORAL | 0 refills | Status: DC

## 2017-04-06 NOTE — Progress Notes (Signed)
[place] MD/PA/NP OP Progress Note  04/06/2017 10:32 AM Kendra Ball  MRN:  [id number]  Chief Complaint:  Chief Complaint    Depression; Follow-up     HPI: "Good, really good. I have so much better". Last month was 18 months sober. She is currently getting ready for Christmas. She makes her own Christmas cards and plans to send them out this week.  Pt found 5 lumps on her shoulder. They biopsied and found them to be benign and cause could not be found. She is working with her ENT on a regular basis.     Depression seems resolved. Pt is handling stress well. Pt denies anhedonia, feeling withdrawn, isolating and hopelessness. Pt denies SI/HI.   Sleep and appetite are good. Energy is limited and age related.  Anxiety is mild and manageable. Once in a while is it will spike and she uses coping skills to manage it.   Pt states-taking meds as prescribed and denies SE.   Visit Diagnosis:    ICD-10-CM   1. Toxic effect of tobacco cigarette, undetermined intent, initial encounter T65.224A buPROPion (WELLBUTRIN SR) 150 MG 12 hr tablet  2. Alcohol use disorder, moderate, dependence (HCC) F10.20 busPIRone (BUSPAR) 10 MG tablet    traZODone (DESYREL) 150 MG tablet  3. MDD (major depressive disorder), recurrent episode, moderate (HCC) F33.1 busPIRone (BUSPAR) 10 MG tablet    venlafaxine XR (EFFEXOR-XR) 75 MG 24 hr capsule    traZODone (DESYREL) 150 MG tablet  4. GAD (generalized anxiety disorder) F41.1 busPIRone (BUSPAR) 10 MG tablet    venlafaxine XR (EFFEXOR-XR) 75 MG 24 hr capsule  5. Insomnia due to other mental disorder F51.05 traZODone (DESYREL) 150 MG tablet   F99       Past Psychiatric History:  Anxiety: Yes Bipolar Disorder: No Depression: Yes Mania: No Psychosis: No Schizophrenia: No Personality Disorder: No Hospitalization for psychiatric illness: No History of Electroconvulsive Shock Therapy: No Prior Suicide Attempts: No   Past Medical History:  Past Medical  History:  Diagnosis Date  . Adenopathy    right supraclavicular  . Anxiety   . Arthritis   . COPD (chronic obstructive pulmonary disease) ([place])   . DEPRESSION 08/26/2008  . Hx of colonic polyps    First noted on  colonoscopy 2012  . Hypercholesteremia   . HYPERTENSION 08/26/2008  . HYPOTHYROIDISM 08/26/2008  . Obesity   . Osteoporosis   . Substance abuse [facility])    inpatient and outpatient tx for substance abuse  . TOBACCO USER 12/25/2009    Past Surgical History:  Procedure Laterality Date  . [place] SURGERY  2006  . CESAREAN SECTION    . COLON RESECTION N/A 05/09/2014   Procedure: LAPAROSCOPIC HAND-ASSISTED EXTENDED RIGHT COLECTOMY, LAPAROSCOPIC LYSIS OF ADHESIONS, SPLENIC FLEXURE MOBILIZATION.;  Surgeon: [name], MD;  Location: [facility];  Service: General;  Laterality: N/A;  . DILATION AND CURETTAGE OF UTERUS     x2  . FOREARM FRACTURE SURGERY     right  . GASTRIC BYPASS  2006  . MASS EXCISION Right 02/24/2017   Procedure: EXCISIONAL BIOPSY RIGHT SUPRA-CLAVICULAR NODE;  Surgeon: [name], MD;  Location: [facility];  Service: ENT;  Laterality: Right;  . TONSILLECTOMY      Family Psychiatric History:  Family History  Problem Relation Age of Onset  . Cancer Mother        colon  . Dementia Mother   . Depression Mother   . Heart disease Father 63  . Heart disease  Sister 40       stents, pacemaker  . Depression Sister   . Heart disease Brother 105       CABG, MI  . Depression Brother   . Obesity Daughter   . Suicidality Daughter   . Bipolar disorder Daughter   . Obesity Son   . Mental illness Daughter        bipolar  . Obesity Daughter   . Depression Sister   . Dementia Paternal Aunt   . Alcohol abuse Maternal Uncle   . Colon cancer Maternal Aunt   . Stomach cancer Maternal Grandfather     Social History:  Social History   Socioeconomic History  . Marital status: Married    Spouse name: None  . Number of children: 3  . Years of  education: None  . Highest education level: None  Social Needs  . Financial resource strain: None  . Food insecurity - worry: None  . Food insecurity - inability: None  . Transportation needs - medical: None  . Transportation needs - non-medical: None  Occupational History  . Occupation: RETIRED    Employer: [employer]  Tobacco Use  . Smoking status: Current Every Day Smoker    Packs/day: 1.00    Years: 50.00    Pack years: 50.00    Types: Cigarettes  . Smokeless tobacco: Never Used  Substance and Sexual Activity  . Alcohol use: No    Comment: quit on Aug 27, 2015  . Drug use: No    Comment: last used May 2017  . Sexual activity: Not Currently    Partners: Male    Birth control/protection: Post-menopausal  Other Topics Concern  . None  Social History Narrative   Kendra Ball lives in [place] with her husband. She has 3 grown children and several grand children. She has joint custody of 3 of her grand children as her daughter has bipolar disorder and has needed assistance with children in past.    Allergies:  Allergies  Allergen Reactions  . Morphine Sulfate     Swelling around injection area    Metabolic Disorder Labs: Lab Results  Component Value Date   HGBA1C 5.2 12/06/2016   MPG 111 05/10/2014   No results found for: PROLACTIN Lab Results  Component Value Date   CHOL 164 12/06/2016   TRIG 95.0 12/06/2016   HDL 70.50 12/06/2016   CHOLHDL 2 12/06/2016   VLDL 19.0 12/06/2016   LDLCALC 74 12/06/2016   LDLCALC 82 10/21/2015   Lab Results  Component Value Date   TSH 0.65 12/06/2016   TSH 2.24 01/22/2016    Therapeutic Level Labs: No results found for: LITHIUM No results found for: VALPROATE No components found for:  CBMZ  Current Medications: Current Outpatient Medications  Medication Sig Dispense Refill  . albuterol (PROVENTIL HFA;VENTOLIN HFA) 108 (90 Base) MCG/ACT inhaler Inhale 2 puffs into the lungs every 6 (six) hours as needed for  wheezing or shortness of breath. 1 Inhaler 2  . alendronate (FOSAMAX) 70 MG tablet Take 1 tablet (70 mg total) by mouth once a week. Reported on 10/06/2015 4 tablet 11  . aspirin 81 MG tablet Take 81 mg by mouth daily.    . budesonide-formoterol (SYMBICORT) 160-4.5 MCG/ACT inhaler Inhale 2 puffs into the lungs 2 (two) times daily. 1 Inhaler 6  . buPROPion (WELLBUTRIN SR) 150 MG 12 hr tablet Take 1 tablet (150 mg total) by mouth 2 (two) times daily. 180 tablet 0  . busPIRone (  BUSPAR) 10 MG tablet Take 1 tablet (10 mg total) by mouth 3 (three) times daily. 270 tablet 0  . Cyanocobalamin (VITAMIN B-12 PO) Take 1 tablet by mouth daily.    . ferrous fumarate (HEMOCYTE - 106 MG FE) 325 (106 FE) MG TABS Take 1 tablet by mouth daily. Reported on 10/06/2015    . levothyroxine (SYNTHROID, LEVOTHROID) 100 MCG tablet Take 1 tablet (100 mcg total) by mouth daily before breakfast. 90 tablet 3  . lisinopril (PRINIVIL,ZESTRIL) 10 MG tablet Take 1 tablet (10 mg total) by mouth daily. 90 tablet 1  . Magnesium 250 MG TABS Take 250 mg by mouth 2 (two) times daily.     . simvastatin (ZOCOR) 10 MG tablet Take 1 tablet (10 mg total) by mouth at bedtime. 90 tablet 3  . traZODone (DESYREL) 150 MG tablet Take 1 tablet (150 mg total) by mouth at bedtime. 90 tablet 0  . venlafaxine XR (EFFEXOR-XR) 75 MG 24 hr capsule TAKE ONE CAPSULE BY MOUTH EVERY DAY WITH BREAKFAST 90 capsule 0   No current facility-administered medications for this visit.      Musculoskeletal: Strength & Muscle Tone: within normal limits Gait & Station: normal Patient leans: N/A  Psychiatric Specialty Exam: Review of Systems  Constitutional: Negative for chills and fever.  HENT: Negative for congestion, ear pain, sinus pain and sore throat.   Neurological: Negative for weakness.    Blood pressure 123/75, pulse 94, temperature 98.3 F (36.8 C), temperature source Oral, resp. rate 16, height 5\' 4"  (1.626 m), weight 168 lb (76.2 kg), SpO2 100  %.Body mass index is 28.84 kg/m.  General Appearance: Fairly Groomed  Eye Contact:  Good  Speech:  Clear and Coherent and Normal Rate  Volume:  Normal  Mood:  Euthymic  Affect:  Full Range  Thought Process:  Goal Directed and Descriptions of Associations: Intact  Orientation:  Full (Time, Place, and Person)  Thought Content: Logical   Suicidal Thoughts:  No  Homicidal Thoughts:  No  Memory:  Immediate;   Good Recent;   Good Remote;   Good  Judgement:  Good  Insight:  Good  Psychomotor Activity:  Normal  Concentration:  Concentration: Good and Attention Span: Good  Recall:  Good  Fund of Knowledge: Good  Language: Good  Akathisia:  No  Handed:  Right  AIMS (if indicated): not done  Assets:  Communication Skills Desire for Improvement Financial Resources/Insurance Housing Leisure Time Resilience Social Support Talents/Skills Transportation  ADL's:  Intact  Cognition: WNL  Sleep:  Good   Screenings: AUDIT     Counselor from 09/10/2015 in [place]  Alcohol Use Disorder Identification Test Final Score (AUDIT)  34    CAGE-AID     Counselor from 09/10/2015 in [place] Score  4    GAD-7     Counselor from 09/14/2015 in [place]  Total GAD-7 Score  7    PHQ2-9     Office Visit from 02/16/2017 in [facility] Visit from 12/12/2016 in [place] from 11/05/2015 in [place] Office Visit from 10/16/2015 in [place] Counselor from 09/10/2015 in [place]  PHQ-2 Total Score  0  2  2  0  2  PHQ-9 Total Score  No data  6  9  No data  9  Assessment and Plan: MDD-recurrent, moderate; GAD; insomnia; alcohol use disorder in remission; nicotine dependence   Medication management with supportive  therapy. Risks/benefits and SE of the medication discussed. Pt verbalized understanding and verbal consent obtained for treatment.  Affirm with the patient that the medications are taken as ordered. Patient expressed understanding of how their medications were to be used.   Meds: Effexor XR 75 mg p.o. daily for depression and anxiety Wellbutrin SR 150 mg p.o. twice daily for depression.  It is not helping with smoking cessation Trazodone 150 mg p.o. nightly for sleep BuSpar 10 mg p.o. 3 times daily for anxiety   Labs: none  Therapy: brief supportive therapy provided. Discussed psychosocial stressors in detail.     Consultations:  Encouraged to follow up with PCP as needed  Pt denies SI and is at an acute low risk for suicide. Patient told to call clinic if any problems occur. Patient advised to go to ER if they should develop SI/HI, side effects, or if symptoms worsen. Has crisis numbers to call if needed. Pt verbalized understanding.  F/up in 3 months or sooner if needed   [name], MD 04/06/2017, 10:32 AM

## 2017-04-25 DIAGNOSIS — C8590 Non-Hodgkin lymphoma, unspecified, unspecified site: Secondary | ICD-10-CM | POA: Insufficient documentation

## 2017-04-25 HISTORY — DX: Non-Hodgkin lymphoma, unspecified, unspecified site: C85.90

## 2017-05-25 ENCOUNTER — Other Ambulatory Visit: Payer: Self-pay | Admitting: Otolaryngology

## 2017-05-25 DIAGNOSIS — R590 Localized enlarged lymph nodes: Secondary | ICD-10-CM

## 2017-05-30 ENCOUNTER — Ambulatory Visit
Admission: RE | Admit: 2017-05-30 | Discharge: 2017-05-30 | Disposition: A | Discharge status: Home / Self Care | Payer: Medicare Other | Source: Ambulatory Visit | Attending: Otolaryngology | Admitting: Otolaryngology

## 2017-05-30 DIAGNOSIS — R590 Localized enlarged lymph nodes: Secondary | ICD-10-CM

## 2017-05-30 MED ORDER — IOPAMIDOL (ISOVUE-300) INJECTION 61%
75.0000 mL | Freq: Once | INTRAVENOUS | Status: AC | PRN
  Administered 2017-05-30: 75 mL via INTRAVENOUS

## 2017-06-01 ENCOUNTER — Telehealth: Payer: Self-pay | Admitting: Family Medicine

## 2017-06-01 DIAGNOSIS — R590 Localized enlarged lymph nodes: Secondary | ICD-10-CM

## 2017-06-01 NOTE — Telephone Encounter (Signed)
Patient was sent to ENT, Dr. Erik Obey for head/neck lymphadenopathy. He has performed a biopsy which resulted with atypical lymphoid proliferation and necrotic soft tissue with associated acute and chronic inflammation.[name] He repeated CT neck and chest and new axillary lymph nodes were also discovered on 2nd CT. - Surgical referral is needed for biopsy on axillary lymphadenopathy. Patient was contacted today. I personally spoke with her and told her the results to her chest CT. We discussed this and she is in agreement to surgical referral. Which was placed today.

## 2017-06-02 ENCOUNTER — Other Ambulatory Visit: Payer: Self-pay | Admitting: Otolaryngology

## 2017-06-02 DIAGNOSIS — R590 Localized enlarged lymph nodes: Secondary | ICD-10-CM

## 2017-06-03 ENCOUNTER — Ambulatory Visit
Admission: RE | Admit: 2017-06-03 | Discharge: 2017-06-03 | Disposition: A | Discharge status: Home / Self Care | Payer: Medicare Other | Source: Ambulatory Visit | Attending: Otolaryngology | Admitting: Otolaryngology

## 2017-06-03 DIAGNOSIS — R590 Localized enlarged lymph nodes: Secondary | ICD-10-CM

## 2017-06-03 MED ORDER — IOPAMIDOL (ISOVUE-300) INJECTION 61%
75.0000 mL | Freq: Once | INTRAVENOUS | Status: AC | PRN
  Administered 2017-06-03: 75 mL via INTRAVENOUS

## 2017-06-05 ENCOUNTER — Telehealth: Payer: Self-pay | Admitting: Hematology and Oncology

## 2017-06-05 NOTE — Telephone Encounter (Signed)
Appt has been scheduled for the pt to see Dr. Lebron Conners on 2/12 at 320pm. Pt has agreed to the appt date and time.

## 2017-06-06 ENCOUNTER — Inpatient Hospital Stay: Payer: Medicare Other | Attending: Hematology and Oncology | Admitting: Hematology and Oncology

## 2017-06-06 ENCOUNTER — Telehealth: Payer: Self-pay | Admitting: Hematology and Oncology

## 2017-06-06 ENCOUNTER — Inpatient Hospital Stay: Payer: Medicare Other

## 2017-06-06 ENCOUNTER — Encounter: Payer: Self-pay | Admitting: Hematology and Oncology

## 2017-06-06 VITALS — BP 141/56 | HR 76 | Temp 97.6°F | Resp 20 | Ht 64.0 in | Wt 165.3 lb

## 2017-06-06 DIAGNOSIS — R591 Generalized enlarged lymph nodes: Secondary | ICD-10-CM

## 2017-06-06 DIAGNOSIS — Z800 Family history of malignant neoplasm of digestive organs: Secondary | ICD-10-CM | POA: Insufficient documentation

## 2017-06-06 DIAGNOSIS — E039 Hypothyroidism, unspecified: Secondary | ICD-10-CM

## 2017-06-06 DIAGNOSIS — Z79899 Other long term (current) drug therapy: Secondary | ICD-10-CM | POA: Diagnosis not present

## 2017-06-06 DIAGNOSIS — Z7982 Long term (current) use of aspirin: Secondary | ICD-10-CM | POA: Insufficient documentation

## 2017-06-06 DIAGNOSIS — R590 Localized enlarged lymph nodes: Secondary | ICD-10-CM | POA: Insufficient documentation

## 2017-06-06 DIAGNOSIS — F17210 Nicotine dependence, cigarettes, uncomplicated: Secondary | ICD-10-CM

## 2017-06-06 LAB — CBC WITH DIFFERENTIAL (CANCER CENTER ONLY)
Basophils Absolute: 0 10*3/uL (ref 0.0–0.1)
Basophils Relative: 0 %
Eosinophils Absolute: 0.1 10*3/uL (ref 0.0–0.5)
Eosinophils Relative: 1 %
HCT: 45.6 % (ref 34.8–46.6)
Hemoglobin: 15 g/dL (ref 11.6–15.9)
Lymphocytes Relative: 17 %
Lymphs Abs: 1.3 10*3/uL (ref 0.9–3.3)
MCH: 31.8 pg (ref 25.1–34.0)
MCHC: 32.9 g/dL (ref 31.5–36.0)
MCV: 96.8 fL (ref 79.5–101.0)
Monocytes Absolute: 0.6 10*3/uL (ref 0.1–0.9)
Monocytes Relative: 8 %
Neutro Abs: 5.5 10*3/uL (ref 1.5–6.5)
Neutrophils Relative %: 74 %
Platelet Count: 255 10*3/uL (ref 145–400)
RBC: 4.71 MIL/uL (ref 3.70–5.45)
RDW: 13.6 % (ref 11.2–14.5)
WBC Count: 7.5 10*3/uL (ref 3.9–10.3)

## 2017-06-06 LAB — CMP (CANCER CENTER ONLY)
ALT: 15 U/L (ref 0–55)
AST: 18 U/L (ref 5–34)
Albumin: 4 g/dL (ref 3.5–5.0)
Alkaline Phosphatase: 72 U/L (ref 40–150)
Anion gap: 10 (ref 3–11)
BUN: 12 mg/dL (ref 7–26)
CO2: 28 mmol/L (ref 22–29)
Calcium: 9.7 mg/dL (ref 8.4–10.4)
Chloride: 101 mmol/L (ref 98–109)
Creatinine: 0.78 mg/dL (ref 0.60–1.10)
GFR, Est AFR Am: >60 mL/min (ref >60)
GFR, Estimated: >60 mL/min (ref >60)
Glucose, Bld: 80 mg/dL (ref 70–140)
Potassium: 4.4 mmol/L (ref 3.5–5.1)
Sodium: 139 mmol/L (ref 136–145)
Total Bilirubin: 0.5 mg/dL (ref 0.2–1.2)
Total Protein: 7.3 g/dL (ref 6.4–8.3)

## 2017-06-06 LAB — C-REACTIVE PROTEIN: CRP: 1.1 mg/dL — ABNORMAL HIGH (ref <1.0)

## 2017-06-06 LAB — SEDIMENTATION RATE: Sed Rate: 19 mm/hr (ref 0–22)

## 2017-06-06 LAB — LACTATE DEHYDROGENASE: LDH: 186 U/L (ref 125–245)

## 2017-06-06 LAB — URIC ACID: Uric Acid, Serum: 4.7 mg/dL (ref 2.6–7.4)

## 2017-06-06 NOTE — Telephone Encounter (Signed)
Scheduled appt per 2/12 los - Gave patient AVS and calender per los.  

## 2017-06-07 LAB — HEPATITIS B CORE ANTIBODY, TOTAL: Hep B Core Total Ab: NEGATIVE

## 2017-06-07 LAB — C3 AND C4
C3 Complement: 134 mg/dL (ref 82–167)
Complement C4, Body Fluid: 36 mg/dL (ref 14–44)

## 2017-06-07 LAB — HEPATITIS B SURFACE ANTIGEN: Hepatitis B Surface Ag: NEGATIVE

## 2017-06-07 LAB — HCV COMMENT:

## 2017-06-07 LAB — HEPATITIS C ANTIBODY (REFLEX): HCV Ab: <0.1 s/co ratio (ref 0.0–0.9)

## 2017-06-07 LAB — BETA 2 MICROGLOBULIN, SERUM: Beta-2 Microglobulin: 2.1 mg/L (ref 0.6–2.4)

## 2017-06-07 LAB — HEPATITIS B SURFACE ANTIBODY,QUALITATIVE: Hep B S Ab: NONREACTIVE

## 2017-06-07 LAB — RHEUMATOID FACTOR: Rhuematoid fact SerPl-aCnc: <10 IU/mL (ref 0.0–13.9)

## 2017-06-09 ENCOUNTER — Encounter (INDEPENDENT_AMBULATORY_CARE_PROVIDER_SITE_OTHER): Payer: Self-pay

## 2017-06-12 LAB — ANTINUCLEAR ANTIBODIES, IFA: ANA Ab, IFA: NEGATIVE

## 2017-06-19 ENCOUNTER — Other Ambulatory Visit: Payer: Self-pay

## 2017-06-19 ENCOUNTER — Ambulatory Visit: Payer: Self-pay | Admitting: Surgery

## 2017-06-19 ENCOUNTER — Encounter (HOSPITAL_COMMUNITY): Payer: Self-pay | Admitting: *Deleted

## 2017-06-19 NOTE — H&P (View-Only) (Signed)
istory of Present Illness Kendra Ball. Kendra Olshefski MD; 06/19/2017 12:32 PM) The patient is a 72 year old female who presents with lymphadenopathy. PCP - [facility] - Erik Obey  This is a 72 year old female who is a heavy smoker who presents with lymphadenopathy and right axilla. Last year the patient developed lymphadenopathy in cervical and supraclavicular regions. She was evaluated by ENT and they performed a right supraclavicular lymph node biopsy. Pathology was negative for malignancy. This showed atypical lymphoid proliferation. Recently she noticed more right axillary lymphadenopathy. She had a follow-up CT of the chest and neck on 05/30/17. This showed multiple right axillary lymph nodes that are enlarged and a single enlarged left axillary lymph node. This is concerning for lymphoma. She is now referred for surgical biopsy. She has seen Dr. Luiz Blare and oncology but his note is not yet available.  CLINICAL DATA: EVAL LYMPHADENOPATHY RT CERVICAL /AXILLA REGION X 02/2017 WITH SXNO HX DM, CAHX HTN  EXAM: CT CHEST WITH CONTRAST  TECHNIQUE: Multidetector CT imaging of the chest was performed during intravenous contrast administration.  CONTRAST: 75 mL of Isovue-300 intravenous contrast  COMPARISON: Prior chest CTs, 11/07/2016, 10/13/2015 and 10/01/2013.  FINDINGS: Cardiovascular: Heart is normal in size and configuration. There are moderate three-vessel coronary artery calcifications. No pericardial effusion. Great vessels normal in caliber. Mild atherosclerotic calcifications noted along the aortic arch.  Mediastinum/Nodes: There are multiple enlarged right axillary lymph nodes. Largest right axillary lymph node measures 2.6 x 2.2 cm. Single enlarged left axillary lymph node measures 16 mm in short axis. These enlarged lymph nodes are new since the prior CTs. There are no enlarged neck base lymph nodes.  No mediastinal or hilar masses. No mediastinal or hilar  adenopathy.  Trachea and esophagus are unremarkable.  Lungs/Pleura: Stable benign calcified nodule in the left lower lobe superior segment adjacent to the oblique fissure. No evidence of pneumonia or pulmonary edema. No lung masses or suspicious nodules. Stable appearance from the prior exams.  Upper Abdomen: No acute findings. There changes from prior stomach surgery. Enlarged lymph nodes.  Musculoskeletal: No fracture or acute finding. No osteoblastic or osteolytic lesions.  IMPRESSION: 1. Multiple enlarged right axillary lymph nodes with a single enlarged left axillary lymph node. This is concerning for lymphoma or other lymphoproliferative disorder. The enlarged lymph nodes are new since the prior CT scans. 2. No other adenopathy. Specifically, no neck base, mediastinal or hilar adenopathy or visible adenopathy in the upper abdomen. 3. No acute findings in the lungs. 4. Aortic atherosclerosis.  Aortic Atherosclerosis (ICD10-I70.0).   Electronically Signed By: Lajean Manes M.D. On: 05/30/2017 18:57   Medication History (Armen Eulas Post, CMA; 06/19/2017 10:18 AM) Albuterol (90MCG/ACT Aerosol Soln, Inhalation) Active. Alendronate Sodium (70MG  Tablet, Oral) Active. Budesonide-Formoterol Fumarate (160-4.5MCG/ACT Aerosol, Inhalation) Active. BuSpar (10MG  Tablet, Oral) Active. Ferrous Fumarate (325 (106 Fe)MG Tablet, Oral) Active. Venlafaxine HCl (75MG  Capsule ER, Oral) Active. Vitamin B-12 (500MCG Tablet, Oral) Active. LORazepam (1MG  Tablet, Oral) Active. BuPROPion HCl ER (SR) (150MG  Tablet ER 12HR, Oral) Active. Levothyroxine Sodium (100MCG Tablet, Oral) Active. Lisinopril (20MG  Tablet, Oral) Active. Simvastatin (10MG  Tablet, Oral) Active. Aspirin (325MG  Tablet, Oral) Active. Iron (Ferrous Gluconate) (325MG  Tablet, Oral) Active. Magnesium Gluconate (250MG  Tablet, Oral) Active. Melatonin (3MG  Tablet Disint, Oral) Active. Medications  Reconciled    Vitals (Armen Glenn CMA; 06/19/2017 10:10 AM) 06/19/2017 10:09 AM Weight: 167.5 lb Height: 62in Body Surface Area: 1.77 m Body Mass Index: 30.64 kg/m  Temp.: 97.26F  Pulse: 86 (Regular)  P.OX: 93% (Room  air) BP: 108/70 (Sitting, Left Arm, Standard)      Physical Exam Rodman Key K. Amyla Heffner MD; 06/19/2017 12:32 PM)  The physical exam findings are as follows: Note:WDWN in NAD - smells of tobacco smoke Eyes: Pupils equal, round; sclera anicteric HENT: Oral mucosa moist; good dentition Neck: No masses palpated, no thyromegaly Right axillary lymphadenopathy easily palpated; firm, fixed Lungs: CTA bilaterally; normal respiratory effort CV: Regular rate and rhythm; no murmurs; extremities well-perfused with no edema Abd: +bowel sounds, soft, non-tender, no palpable organomegaly; no palpable hernias Skin: Warm, dry; no sign of jaundice Psychiatric - alert and oriented x 4; calm mood and affect    Assessment & Plan Rodman Key K. Bianna Haran MD; 06/19/2017 10:40 AM)  LYMPHADENOPATHY, AXILLARY (R59.0) Impression: Right axilla  Current Plans Schedule for Surgery - Right axillary lymph node biopsy. The surgical procedure has been discussed with the patient. Potential risks, benefits, alternative treatments, and expected outcomes have been explained. All of the patient's questions at this time have been answered. The likelihood of reaching the patient's treatment goal is good. The patient understand the proposed surgical procedure and wishes to proceed.  Kendra Ball. Georgette Dover, MD, [facility] Surgery  General/ Trauma Surgery  06/19/2017 12:32 PM

## 2017-06-19 NOTE — H&P (Signed)
istory of Present Illness Kendra Ball. Reakwon Barren MD; 06/19/2017 12:32 PM) The patient is a 72 year old female who presents with lymphadenopathy. PCP - [facility] - Erik Obey  This is a 72 year old female who is a heavy smoker who presents with lymphadenopathy and right axilla. Last year the patient developed lymphadenopathy in cervical and supraclavicular regions. She was evaluated by ENT and they performed a right supraclavicular lymph node biopsy. Pathology was negative for malignancy. This showed atypical lymphoid proliferation. Recently she noticed more right axillary lymphadenopathy. She had a follow-up CT of the chest and neck on 05/30/17. This showed multiple right axillary lymph nodes that are enlarged and a single enlarged left axillary lymph node. This is concerning for lymphoma. She is now referred for surgical biopsy. She has seen Dr. Luiz Blare and oncology but his note is not yet available.  CLINICAL DATA: EVAL LYMPHADENOPATHY RT CERVICAL /AXILLA REGION X 02/2017 WITH SXNO HX DM, CAHX HTN  EXAM: CT CHEST WITH CONTRAST  TECHNIQUE: Multidetector CT imaging of the chest was performed during intravenous contrast administration.  CONTRAST: 75 mL of Isovue-300 intravenous contrast  COMPARISON: Prior chest CTs, 11/07/2016, 10/13/2015 and 10/01/2013.  FINDINGS: Cardiovascular: Heart is normal in size and configuration. There are moderate three-vessel coronary artery calcifications. No pericardial effusion. Great vessels normal in caliber. Mild atherosclerotic calcifications noted along the aortic arch.  Mediastinum/Nodes: There are multiple enlarged right axillary lymph nodes. Largest right axillary lymph node measures 2.6 x 2.2 cm. Single enlarged left axillary lymph node measures 16 mm in short axis. These enlarged lymph nodes are new since the prior CTs. There are no enlarged neck base lymph nodes.  No mediastinal or hilar masses. No mediastinal or hilar  adenopathy.  Trachea and esophagus are unremarkable.  Lungs/Pleura: Stable benign calcified nodule in the left lower lobe superior segment adjacent to the oblique fissure. No evidence of pneumonia or pulmonary edema. No lung masses or suspicious nodules. Stable appearance from the prior exams.  Upper Abdomen: No acute findings. There changes from prior stomach surgery. Enlarged lymph nodes.  Musculoskeletal: No fracture or acute finding. No osteoblastic or osteolytic lesions.  IMPRESSION: 1. Multiple enlarged right axillary lymph nodes with a single enlarged left axillary lymph node. This is concerning for lymphoma or other lymphoproliferative disorder. The enlarged lymph nodes are new since the prior CT scans. 2. No other adenopathy. Specifically, no neck base, mediastinal or hilar adenopathy or visible adenopathy in the upper abdomen. 3. No acute findings in the lungs. 4. Aortic atherosclerosis.  Aortic Atherosclerosis (ICD10-I70.0).   Electronically Signed By: Lajean Manes M.D. On: 05/30/2017 18:57   Medication History (Armen Eulas Post, CMA; 06/19/2017 10:18 AM) Albuterol (90MCG/ACT Aerosol Soln, Inhalation) Active. Alendronate Sodium (70MG  Tablet, Oral) Active. Budesonide-Formoterol Fumarate (160-4.5MCG/ACT Aerosol, Inhalation) Active. BuSpar (10MG  Tablet, Oral) Active. Ferrous Fumarate (325 (106 Fe)MG Tablet, Oral) Active. Venlafaxine HCl (75MG  Capsule ER, Oral) Active. Vitamin B-12 (500MCG Tablet, Oral) Active. LORazepam (1MG  Tablet, Oral) Active. BuPROPion HCl ER (SR) (150MG  Tablet ER 12HR, Oral) Active. Levothyroxine Sodium (100MCG Tablet, Oral) Active. Lisinopril (20MG  Tablet, Oral) Active. Simvastatin (10MG  Tablet, Oral) Active. Aspirin (325MG  Tablet, Oral) Active. Iron (Ferrous Gluconate) (325MG  Tablet, Oral) Active. Magnesium Gluconate (250MG  Tablet, Oral) Active. Melatonin (3MG  Tablet Disint, Oral) Active. Medications  Reconciled    Vitals (Armen Glenn CMA; 06/19/2017 10:10 AM) 06/19/2017 10:09 AM Weight: 167.5 lb Height: 62in Body Surface Area: 1.77 m Body Mass Index: 30.64 kg/m  Temp.: 97.45F  Pulse: 86 (Regular)  P.OX: 93% (Room  air) BP: 108/70 (Sitting, Left Arm, Standard)      Physical Exam Rodman Key K. Juanmiguel Defelice MD; 06/19/2017 12:32 PM)  The physical exam findings are as follows: Note:WDWN in NAD - smells of tobacco smoke Eyes: Pupils equal, round; sclera anicteric HENT: Oral mucosa moist; good dentition Neck: No masses palpated, no thyromegaly Right axillary lymphadenopathy easily palpated; firm, fixed Lungs: CTA bilaterally; normal respiratory effort CV: Regular rate and rhythm; no murmurs; extremities well-perfused with no edema Abd: +bowel sounds, soft, non-tender, no palpable organomegaly; no palpable hernias Skin: Warm, dry; no sign of jaundice Psychiatric - alert and oriented x 4; calm mood and affect    Assessment & Plan Rodman Key K. Alsace Dowd MD; 06/19/2017 10:40 AM)  LYMPHADENOPATHY, AXILLARY (R59.0) Impression: Right axilla  Current Plans Schedule for Surgery - Right axillary lymph node biopsy. The surgical procedure has been discussed with the patient. Potential risks, benefits, alternative treatments, and expected outcomes have been explained. All of the patient's questions at this time have been answered. The likelihood of reaching the patient's treatment goal is good. The patient understand the proposed surgical procedure and wishes to proceed.  Kendra Ball. Georgette Dover, MD, [facility] Surgery  General/ Trauma Surgery  06/19/2017 12:32 PM

## 2017-06-20 ENCOUNTER — Telehealth: Payer: Self-pay

## 2017-06-20 NOTE — Telephone Encounter (Signed)
Spoke with patient and she wanted appointment change due to biopsy appointment so [facility] can see the results also.. Per 2/26 phone messsage return calls.

## 2017-06-21 NOTE — Anesthesia Preprocedure Evaluation (Signed)
Anesthesia Evaluation  Patient identified by MRN, date of birth, ID band Patient awake    Reviewed: Allergy & Precautions, NPO status , Patient's Chart, lab work & pertinent test results  Airway Mallampati: I  TM Distance: >3 FB Neck ROM: Full    Dental  (+) Partial Lower, Missing,    Pulmonary COPD, Current Smoker,    breath sounds clear to auscultation       Cardiovascular hypertension, Pt. on medications + CAD   Rhythm:Regular Rate:Normal     Neuro/Psych PSYCHIATRIC DISORDERS Anxiety Depression negative neurological ROS     GI/Hepatic negative GI ROS, Neg liver ROS,   Endo/Other  Hypothyroidism   Renal/GU negative Renal ROS     Musculoskeletal  (+) Arthritis , Osteoarthritis,    Abdominal   Peds  Hematology negative hematology ROS (+)   Anesthesia Other Findings Day of surgery medications reviewed with the patient.  Reproductive/Obstetrics                             Anesthesia Physical  Anesthesia Plan  ASA: III  Anesthesia Plan: General   Post-op Pain Management:    Induction: Intravenous  PONV Risk Score and Plan: 3 and Ondansetron, Dexamethasone, Midazolam and Treatment may vary due to age or medical condition  Airway Management Planned: LMA  Additional Equipment:   Intra-op Plan:   Post-operative Plan: Extubation in OR  Informed Consent: I have reviewed the patients History and Physical, chart, labs and discussed the procedure including the risks, benefits and alternatives for the proposed anesthesia with the patient or authorized representative who has indicated his/her understanding and acceptance.   Dental advisory given  Plan Discussed with: CRNA  Anesthesia Plan Comments:         Anesthesia Quick Evaluation

## 2017-06-22 ENCOUNTER — Ambulatory Visit (HOSPITAL_COMMUNITY): Payer: Medicare Other | Admitting: Anesthesiology

## 2017-06-22 ENCOUNTER — Other Ambulatory Visit: Payer: Self-pay

## 2017-06-22 ENCOUNTER — Encounter (HOSPITAL_COMMUNITY): Payer: Self-pay | Admitting: Emergency Medicine

## 2017-06-22 ENCOUNTER — Encounter (HOSPITAL_COMMUNITY)
Admission: RE | Disposition: A | Discharge status: Home / Self Care | Payer: Self-pay | Source: Ambulatory Visit | Attending: Surgery

## 2017-06-22 ENCOUNTER — Ambulatory Visit (HOSPITAL_COMMUNITY)
Admission: RE | Admit: 2017-06-22 | Discharge: 2017-06-22 | Disposition: A | Discharge status: Home / Self Care | Payer: Medicare Other | Source: Ambulatory Visit | Attending: Surgery | Admitting: Surgery

## 2017-06-22 DIAGNOSIS — I881 Chronic lymphadenitis, except mesenteric: Secondary | ICD-10-CM | POA: Insufficient documentation

## 2017-06-22 DIAGNOSIS — I10 Essential (primary) hypertension: Secondary | ICD-10-CM | POA: Diagnosis not present

## 2017-06-22 DIAGNOSIS — I700 Atherosclerosis of aorta: Secondary | ICD-10-CM | POA: Diagnosis not present

## 2017-06-22 DIAGNOSIS — J449 Chronic obstructive pulmonary disease, unspecified: Secondary | ICD-10-CM | POA: Diagnosis not present

## 2017-06-22 DIAGNOSIS — E039 Hypothyroidism, unspecified: Secondary | ICD-10-CM | POA: Insufficient documentation

## 2017-06-22 DIAGNOSIS — Z7982 Long term (current) use of aspirin: Secondary | ICD-10-CM | POA: Insufficient documentation

## 2017-06-22 DIAGNOSIS — I2510 Atherosclerotic heart disease of native coronary artery without angina pectoris: Secondary | ICD-10-CM | POA: Insufficient documentation

## 2017-06-22 DIAGNOSIS — Z79899 Other long term (current) drug therapy: Secondary | ICD-10-CM | POA: Insufficient documentation

## 2017-06-22 DIAGNOSIS — F419 Anxiety disorder, unspecified: Secondary | ICD-10-CM | POA: Insufficient documentation

## 2017-06-22 DIAGNOSIS — E119 Type 2 diabetes mellitus without complications: Secondary | ICD-10-CM | POA: Insufficient documentation

## 2017-06-22 DIAGNOSIS — Z885 Allergy status to narcotic agent status: Secondary | ICD-10-CM | POA: Insufficient documentation

## 2017-06-22 DIAGNOSIS — F17210 Nicotine dependence, cigarettes, uncomplicated: Secondary | ICD-10-CM | POA: Diagnosis not present

## 2017-06-22 DIAGNOSIS — M1990 Unspecified osteoarthritis, unspecified site: Secondary | ICD-10-CM | POA: Diagnosis not present

## 2017-06-22 DIAGNOSIS — F329 Major depressive disorder, single episode, unspecified: Secondary | ICD-10-CM | POA: Diagnosis not present

## 2017-06-22 DIAGNOSIS — R590 Localized enlarged lymph nodes: Secondary | ICD-10-CM | POA: Diagnosis present

## 2017-06-22 HISTORY — PX: AXILLARY LYMPH NODE BIOPSY: SHX5737

## 2017-06-22 SURGERY — AXILLARY LYMPH NODE BIOPSY
Anesthesia: General | Laterality: Right

## 2017-06-22 MED ORDER — MEPERIDINE HCL 50 MG/ML IJ SOLN: 6.2500 mg | INTRAMUSCULAR | Status: DC | PRN

## 2017-06-22 MED ORDER — OXYCODONE HCL 5 MG/5ML PO SOLN
5.0000 mg | Freq: Once | ORAL | Status: AC | PRN
  Filled 2017-06-22: qty 5

## 2017-06-22 MED ORDER — CHLORHEXIDINE GLUCONATE CLOTH 2 % EX PADS: 6.0000 | MEDICATED_PAD | Freq: Once | CUTANEOUS | Status: DC

## 2017-06-22 MED ORDER — LACTATED RINGERS IV SOLN
INTRAVENOUS | Status: DC
  Administered 2017-06-22 (×2): via INTRAVENOUS

## 2017-06-22 MED ORDER — PROMETHAZINE HCL 25 MG/ML IJ SOLN: 6.2500 mg | INTRAMUSCULAR | Status: DC | PRN

## 2017-06-22 MED ORDER — FENTANYL CITRATE (PF) 100 MCG/2ML IJ SOLN
INTRAMUSCULAR | Status: AC
  Filled 2017-06-22: qty 2

## 2017-06-22 MED ORDER — LIDOCAINE 2% (20 MG/ML) 5 ML SYRINGE
INTRAMUSCULAR | Status: DC | PRN
  Administered 2017-06-22: 60 mg via INTRAVENOUS

## 2017-06-22 MED ORDER — CEFAZOLIN SODIUM-DEXTROSE 2-4 GM/100ML-% IV SOLN
2.0000 g | INTRAVENOUS | Status: AC
  Administered 2017-06-22: 2 g via INTRAVENOUS
  Filled 2017-06-22: qty 100

## 2017-06-22 MED ORDER — HYDROCODONE-ACETAMINOPHEN 5-325 MG PO TABS: 1.0000 | ORAL_TABLET | Freq: Four times a day (QID) | ORAL | 0 refills | Status: DC | PRN

## 2017-06-22 MED ORDER — FENTANYL CITRATE (PF) 100 MCG/2ML IJ SOLN
INTRAMUSCULAR | Status: DC | PRN
  Administered 2017-06-22: 100 ug via INTRAVENOUS

## 2017-06-22 MED ORDER — 0.9 % SODIUM CHLORIDE (POUR BTL) OPTIME
TOPICAL | Status: DC | PRN
  Administered 2017-06-22: 1000 mL

## 2017-06-22 MED ORDER — HYDROMORPHONE HCL 1 MG/ML IJ SOLN: 0.2500 mg | INTRAMUSCULAR | Status: DC | PRN

## 2017-06-22 MED ORDER — OXYCODONE HCL 5 MG PO TABS
5.0000 mg | ORAL_TABLET | Freq: Once | ORAL | Status: AC | PRN
  Administered 2017-06-22: 5 mg via ORAL

## 2017-06-22 MED ORDER — PROPOFOL 10 MG/ML IV BOLUS
INTRAVENOUS | Status: DC | PRN
  Administered 2017-06-22: 140 mg via INTRAVENOUS

## 2017-06-22 MED ORDER — OXYCODONE HCL 5 MG PO TABS
ORAL_TABLET | ORAL | Status: AC
  Filled 2017-06-22: qty 1

## 2017-06-22 MED ORDER — PROPOFOL 10 MG/ML IV BOLUS
INTRAVENOUS | Status: AC
  Filled 2017-06-22: qty 20

## 2017-06-22 MED ORDER — BUPIVACAINE-EPINEPHRINE 0.25% -1:200000 IJ SOLN
INTRAMUSCULAR | Status: DC | PRN
  Administered 2017-06-22: 10 mL

## 2017-06-22 MED ORDER — ONDANSETRON HCL 4 MG/2ML IJ SOLN
INTRAMUSCULAR | Status: DC | PRN
  Administered 2017-06-22: 4 mg via INTRAVENOUS

## 2017-06-22 MED ORDER — DEXAMETHASONE SODIUM PHOSPHATE 10 MG/ML IJ SOLN
INTRAMUSCULAR | Status: DC | PRN
  Administered 2017-06-22: 5 mg via INTRAVENOUS

## 2017-06-22 SURGICAL SUPPLY — 37 items
APL SKNCLS STERI-STRIP NONHPOA (GAUZE/BANDAGES/DRESSINGS) ×1
BANDAGE ACE 6X5 VEL STRL LF (GAUZE/BANDAGES/DRESSINGS) IMPLANT
BENZOIN TINCTURE PRP APPL 2/3 (GAUZE/BANDAGES/DRESSINGS) ×2 IMPLANT
BLADE HEX COATED 2.75 (ELECTRODE) ×2 IMPLANT
BLADE SURG 15 STRL LF DISP TIS (BLADE) ×3 IMPLANT
BLADE SURG 15 STRL SS (BLADE) ×6
CLIP VESOCCLUDE SM WIDE 6/CT (CLIP) ×2 IMPLANT
DECANTER SPIKE VIAL GLASS SM (MISCELLANEOUS) ×2 IMPLANT
DRAPE LAPAROSCOPIC ABDOMINAL (DRAPES) ×2 IMPLANT
DRAPE UTILITY XL STRL (DRAPES) ×2 IMPLANT
DRSG TEGADERM 4X4.75 (GAUZE/BANDAGES/DRESSINGS) ×2 IMPLANT
ELECT PENCIL ROCKER SW 15FT (MISCELLANEOUS) ×2 IMPLANT
ELECT REM PT RETURN 15FT ADLT (MISCELLANEOUS) ×2 IMPLANT
GAUZE SPONGE 2X2 8PLY STRL LF (GAUZE/BANDAGES/DRESSINGS) IMPLANT
GAUZE SPONGE 4X4 12PLY STRL (GAUZE/BANDAGES/DRESSINGS) IMPLANT
GAUZE SPONGE 4X4 16PLY XRAY LF (GAUZE/BANDAGES/DRESSINGS) ×2 IMPLANT
GLOVE BIO SURGEON STRL SZ7 (GLOVE) ×2 IMPLANT
GLOVE BIOGEL PI IND STRL 7.0 (GLOVE) ×1 IMPLANT
GLOVE BIOGEL PI IND STRL 7.5 (GLOVE) ×1 IMPLANT
GLOVE BIOGEL PI INDICATOR 7.0 (GLOVE) ×1
GLOVE BIOGEL PI INDICATOR 7.5 (GLOVE) ×1
GOWN STRL REUS W/TWL LRG LVL3 (GOWN DISPOSABLE) ×4 IMPLANT
GOWN STRL REUS W/TWL XL LVL3 (GOWN DISPOSABLE) ×2 IMPLANT
KIT BASIN OR (CUSTOM PROCEDURE TRAY) ×2 IMPLANT
MARKER SKIN DUAL TIP RULER LAB (MISCELLANEOUS) ×2 IMPLANT
NDL HYPO 25X1 1.5 SAFETY (NEEDLE) ×1 IMPLANT
NEEDLE HYPO 22GX1.5 SAFETY (NEEDLE) IMPLANT
NEEDLE HYPO 25X1 1.5 SAFETY (NEEDLE) ×2 IMPLANT
PACK BASIC VI WITH GOWN DISP (CUSTOM PROCEDURE TRAY) ×2 IMPLANT
SPONGE GAUZE 2X2 STER 10/PKG (GAUZE/BANDAGES/DRESSINGS) ×1
STRIP CLOSURE SKIN 1/2X4 (GAUZE/BANDAGES/DRESSINGS) ×2 IMPLANT
SUT VIC AB 3-0 SH 27 (SUTURE) ×2
SUT VIC AB 3-0 SH 27XBRD (SUTURE) ×1 IMPLANT
SYR BULB IRRIGATION 50ML (SYRINGE) ×1 IMPLANT
SYR CONTROL 10ML LL (SYRINGE) ×2 IMPLANT
TOWEL OR 17X26 10 PK STRL BLUE (TOWEL DISPOSABLE) ×2 IMPLANT
YANKAUER SUCT BULB TIP 10FT TU (MISCELLANEOUS) IMPLANT

## 2017-06-22 NOTE — Anesthesia Procedure Notes (Signed)
Procedure Name: LMA Insertion Performed by: Tyjuan Demetro J, CRNA Pre-anesthesia Checklist: Patient identified, Emergency Drugs available, Suction available, Patient being monitored and Timeout performed Patient Re-evaluated:Patient Re-evaluated prior to induction Oxygen Delivery Method: Circle system utilized Preoxygenation: Pre-oxygenation with 100% oxygen Induction Type: IV induction Ventilation: Mask ventilation without difficulty LMA: LMA inserted LMA Size: 4.0 Number of attempts: 1 Placement Confirmation: positive ETCO2,  CO2 detector and breath sounds checked- equal and bilateral Tube secured with: Tape Dental Injury: Teeth and Oropharynx as per pre-operative assessment        

## 2017-06-22 NOTE — Anesthesia Postprocedure Evaluation (Signed)
Anesthesia Post Note  Patient: Kendra Ball  Procedure(s) Performed: RIGHT AXILLARY LYMPH NODE BIOPSY (Right )     Patient location during evaluation: PACU Anesthesia Type: General Level of consciousness: sedated and patient cooperative Pain management: pain level controlled Vital Signs Assessment: post-procedure vital signs reviewed and stable Respiratory status: spontaneous breathing Cardiovascular status: stable Anesthetic complications: no    Last Vitals:  Vitals:   06/22/17 0945 06/22/17 1000  BP: 139/65 (!) 120/48  Pulse: 75 71  Resp: (!) 21 (!) 21  Temp: 36.9 C   SpO2: 100% 99%    Last Pain:  Vitals:   06/22/17 0945  TempSrc:   PainSc: 0-No pain                 Nolon Nations

## 2017-06-22 NOTE — Assessment & Plan Note (Signed)
72 y.o.   Female with minimal systemic symptoms, but radiographic evidence of bilateral axillary lymphadenopathy.  Differential is broad and tissue diagnosis is our primary means of determining the nature of the disease.  Currently, patient is scheduled to undergo surgical assessment followed by possible excisional biopsy of an axillary lymph node on 06/19/17.  Will start additional assessment with lab work and will follow up with the patient following the biopsy.  Plan: -Labs today as outlined below. -Consult general surgery for axillary lymph node biopsy. -Return to our clinic 1 week following the lymph node biopsy.

## 2017-06-22 NOTE — Discharge Instructions (Signed)
[facility] Office Phone Number [phone] POST OP INSTRUCTIONS  Always review your discharge instruction sheet given to you by the facility where your surgery was performed.  IF YOU HAVE DISABILITY OR FAMILY LEAVE FORMS, YOU MUST BRING THEM TO THE OFFICE FOR PROCESSING.  DO NOT GIVE THEM TO YOUR DOCTOR.  1. A prescription for pain medication may be given to you upon discharge.  Take your pain medication as prescribed, if needed.  If narcotic pain medicine is not needed, then you may take acetaminophen (Tylenol) or ibuprofen (Advil) as needed. 2. Take your usually prescribed medications unless otherwise directed 3. If you need a refill on your pain medication, please contact your pharmacy.  They will contact our office to request authorization.  Prescriptions will not be filled after 5pm or on week-ends. 4. You should eat very light the first 24 hours after surgery, such as soup, crackers, pudding, etc.  Resume your normal diet the day after surgery. 5. Most patients will experience some swelling and bruising in the axilla.  Ice packs will help.  Swelling and bruising can take several days to resolve.  6. It is common to experience some constipation if taking pain medication after surgery.  Increasing fluid intake and taking a stool softener will usually help or prevent this problem from occurring.  A mild laxative (Milk of Magnesia or Miralax) should be taken according to package directions if there are no bowel movements after 48 hours. 7. Unless discharge instructions indicate otherwise, you may remove your bandages 48 hours after surgery, and you may shower at that time.  You will have steri-strips (small skin tapes) in place directly over the incision.  These strips should be left on the skin for 7-10 days.    8. ACTIVITIES:  You may resume regular daily activities (gradually increasing) beginning the next day.   You may have sexual intercourse when it is comfortable. a. You  may drive when you no longer are taking prescription pain medication, you can comfortably wear a seatbelt, and you can safely maneuver your car and apply brakes. b. RETURN TO WORK:  1-2 weeks 9. You should see your doctor in the office for a follow-up appointment approximately two weeks after your surgery.  Your doctors nurse will typically make your follow-up appointment when she calls you with your pathology report.  Expect your pathology report 2-3 business days after your surgery.  You may call to check if you do not hear from [place] after three days. 10. OTHER INSTRUCTIONS: _______________________________________________________________________________________________ _____________________________________________________________________________________________________________________________________ _____________________________________________________________________________________________________________________________________ _____________________________________________________________________________________________________________________________________  WHEN TO CALL YOUR DOCTOR: 1. Fever over 101.0 2. Nausea and/or vomiting. 3. Extreme swelling or bruising. 4. Continued bleeding from incision. 5. Increased pain, redness, or drainage from the incision.  The clinic staff is available to answer your questions during regular business hours.  Please dont hesitate to call and ask to speak to one of the nurses for clinical concerns.  If you have a medical emergency, go to the nearest emergency room or call 911.  A surgeon from [facility] is always on call at the hospital.  For further questions, please visit [email or web address]

## 2017-06-22 NOTE — Progress Notes (Signed)
[facility] New Visit:  Assessment: Lymphadenopathy 72 y.o.   Female with minimal systemic symptoms, but radiographic evidence of bilateral axillary lymphadenopathy.  Differential is broad and tissue diagnosis is our primary means of determining the nature of the disease.  Currently, patient is scheduled to undergo surgical assessment followed by possible excisional biopsy of an axillary lymph node on 06/19/17.  Will start additional assessment with lab work and will follow up with the patient following the biopsy.  Plan: -Labs today as outlined below. -Consult general surgery for axillary lymph node biopsy. -Return to our clinic 1 week following the lymph node biopsy.  Voice recognition software was used and creation of this note. Despite my best effort at editing the text, some misspelling/errors may have occurred.  Orders Placed This Encounter  Procedures  . CBC with Differential (Cancer Center Only)    Standing Status:   Future    Number of Occurrences:   1    Standing Expiration Date:   06/06/2018  . CMP ([facility] only)    Standing Status:   Future    Number of Occurrences:   1    Standing Expiration Date:   06/06/2018  . Lactate dehydrogenase (LDH)    Standing Status:   Future    Number of Occurrences:   1    Standing Expiration Date:   06/06/2018  . Beta 2 microglobulin    Standing Status:   Future    Number of Occurrences:   1    Standing Expiration Date:   06/06/2018  . Uric acid    Standing Status:   Future    Number of Occurrences:   1    Standing Expiration Date:   06/06/2018  . Hepatitis B core antibody, total    Standing Status:   Future    Number of Occurrences:   1    Standing Expiration Date:   06/06/2018  . Hepatitis B surface antigen    Standing Status:   Future    Number of Occurrences:   1    Standing Expiration Date:   06/06/2018  . Hepatitis B surface antibody    Standing Status:   Future    Number of Occurrences:   1    Standing  Expiration Date:   06/06/2018  . Hepatitis C antibody (reflex if positive)    Standing Status:   Future    Number of Occurrences:   1    Standing Expiration Date:   06/06/2018  . ANA, IFA (with reflex)    Standing Status:   Future    Number of Occurrences:   1    Standing Expiration Date:   06/06/2018  . Rheumatoid factor    Standing Status:   Future    Number of Occurrences:   1    Standing Expiration Date:   06/06/2018  . C3 and C4    Standing Status:   Future    Number of Occurrences:   1    Standing Expiration Date:   06/06/2018  . Sedimentation rate    Standing Status:   Future    Number of Occurrences:   1    Standing Expiration Date:   06/06/2018  . C-reactive protein    Standing Status:   Future    Number of Occurrences:   1    Standing Expiration Date:   06/06/2018    All questions were answered.  . The patient knows to call the clinic with  any problems, questions or concerns.  This note was electronically signed.    History of Presenting Illness Kendra Ball 72 y.o. presenting to the [facility] for evaluation of possible new lymphoma diagnosis, referred by Dr [name] patient was referred.  His office due to presentation with intermittent voice hoarseness accompanying decreased appetite without significant weight change, fevers, chills, night sweats.  Otherwise, patient denies any shortness of breath, chest pain, or cough.  No nausea, early satiety, abdominal pain, diarrhea, or constipation.  No swelling in the armpits, neck, or groins.   Oncological/hematological History: --CT Neck, 06/03/17: No cervical lymphadenopathy.  Bilateral axillary lymphadenopathy noted, but not fully assessed due to limitations of the study. Medical History: Past Medical History:  Diagnosis Date  . Adenopathy    right supraclavicular  . Anxiety   . Arthritis   . COPD (chronic obstructive pulmonary disease) ([facility])   . DEPRESSION 08/26/2008  . Hx of colonic polyps    First  noted on  colonoscopy 2012  . Hypercholesteremia   . HYPERTENSION 08/26/2008  . HYPOTHYROIDISM 08/26/2008  . Obesity   . Osteoporosis   . Substance abuse [facility])    inpatient and outpatient tx for substance abuse  . TOBACCO USER 12/25/2009    Surgical History: Past Surgical History:  Procedure Laterality Date  . [place] SURGERY  2006  . CESAREAN SECTION    . COLON RESECTION N/A 05/09/2014   Procedure: LAPAROSCOPIC HAND-ASSISTED EXTENDED RIGHT COLECTOMY, LAPAROSCOPIC LYSIS OF ADHESIONS, SPLENIC FLEXURE MOBILIZATION.;  Surgeon: [name], MD;  Location: [facility];  Service: General;  Laterality: N/A;  . DILATION AND CURETTAGE OF UTERUS     x2  . FOREARM FRACTURE SURGERY     right  . GASTRIC BYPASS  2006  . MASS EXCISION Right 02/24/2017   Procedure: EXCISIONAL BIOPSY RIGHT SUPRA-CLAVICULAR NODE;  Surgeon: [name], MD;  Location: [facility];  Service: ENT;  Laterality: Right;  . TONSILLECTOMY      Family History: Family History  Problem Relation Age of Onset  . Cancer Mother        colon  . Dementia Mother   . Depression Mother   . Heart disease Father 34  . Heart disease Sister 8       stents, pacemaker  . Depression Sister   . Heart disease Brother 65       CABG, MI  . Depression Brother   . Obesity Daughter   . Suicidality Daughter   . Bipolar disorder Daughter   . Obesity Son   . Mental illness Daughter        bipolar  . Obesity Daughter   . Depression Sister   . Dementia Paternal Aunt   . Alcohol abuse Maternal Uncle   . Colon cancer Maternal Aunt   . Stomach cancer Maternal Grandfather     Social History: Social History   Socioeconomic History  . Marital status: Married    Spouse name: Not on file  . Number of children: 3  . Years of education: Not on file  . Highest education level: Not on file  Social Needs  . Financial resource strain: Not on file  . Food insecurity - worry: Not on file  . Food insecurity - inability: Not on  file  . Transportation needs - medical: Not on file  . Transportation needs - non-medical: Not on file  Occupational History  . Occupation: RETIRED    Employer: [employer]  Tobacco Use  .  Smoking status: Current Every Day Smoker    Packs/day: 1.00    Years: 50.00    Pack years: 50.00    Types: Cigarettes  . Smokeless tobacco: Never Used  Substance and Sexual Activity  . Alcohol use: No    Comment: quit on Aug 27, 2015  . Drug use: No    Comment: last used May 2017  . Sexual activity: Not Currently    Partners: Male    Birth control/protection: Post-menopausal  Other Topics Concern  . Not on file  Social History Narrative   Ms. Martinovich lives in [place] with her husband. She has 3 grown children and several grand children. She has joint custody of 3 of her grand children as her daughter has bipolar disorder and has needed assistance with children in past.    Allergies: Allergies  Allergen Reactions  . Morphine Sulfate Swelling and Other (See Comments)    Swelling around injection area    Medications:  Current Outpatient Medications  Medication Sig Dispense Refill  . albuterol (PROVENTIL HFA;VENTOLIN HFA) 108 (90 Base) MCG/ACT inhaler Inhale 2 puffs into the lungs every 6 (six) hours as needed for wheezing or shortness of breath. 1 Inhaler 2  . aspirin 81 MG tablet Take 81 mg by mouth daily.    . budesonide-formoterol (SYMBICORT) 160-4.5 MCG/ACT inhaler Inhale 2 puffs into the lungs 2 (two) times daily. 1 Inhaler 6  . buPROPion (WELLBUTRIN SR) 150 MG 12 hr tablet Take 1 tablet (150 mg total) by mouth 2 (two) times daily. 180 tablet 0  . busPIRone (BUSPAR) 10 MG tablet Take 1 tablet (10 mg total) by mouth 3 (three) times daily. 270 tablet 0  . Cyanocobalamin (VITAMIN B-12 PO) Take 1 tablet by mouth daily.    . ferrous fumarate (HEMOCYTE - 106 MG FE) 325 (106 FE) MG TABS Take 1 tablet by mouth daily. Reported on 10/06/2015    . levothyroxine (SYNTHROID, LEVOTHROID)  100 MCG tablet Take 1 tablet (100 mcg total) by mouth daily before breakfast. 90 tablet 3  . lisinopril (PRINIVIL,ZESTRIL) 10 MG tablet Take 1 tablet (10 mg total) by mouth daily. 90 tablet 1  . Magnesium 250 MG TABS Take 250 mg by mouth 2 (two) times daily.     . simvastatin (ZOCOR) 10 MG tablet Take 1 tablet (10 mg total) by mouth at bedtime. 90 tablet 3  . traZODone (DESYREL) 150 MG tablet Take 1 tablet (150 mg total) by mouth at bedtime. 90 tablet 0  . venlafaxine XR (EFFEXOR-XR) 75 MG 24 hr capsule TAKE ONE CAPSULE BY MOUTH EVERY DAY WITH BREAKFAST (Patient taking differently: Take 75 mg by mouth daily with breakfast. ) 90 capsule 0  . HYDROcodone-acetaminophen (NORCO/VICODIN) 5-325 MG tablet Take 1 tablet by mouth every 6 (six) hours as needed for moderate pain. 12 tablet 0   No current facility-administered medications for this visit.     Review of Systems: Review of Systems  Constitutional: Positive for appetite change. Negative for chills, diaphoresis, fever and unexpected weight change.  HENT:   Positive for voice change.   All other systems reviewed and are negative.    PHYSICAL EXAMINATION Blood pressure (!) 141/56, pulse 76, temperature 97.6 F (36.4 C), temperature source Oral, resp. rate 20, height 5' 4"  (1.626 m), weight 165 lb 4.8 oz (75 kg), SpO2 97 %.  ECOG PERFORMANCE STATUS: 1 - Symptomatic but completely ambulatory  Physical Exam  Constitutional: She is oriented to person, place, and time and well-developed, well-nourished, and  in no distress. No distress.  HENT:  Head: Normocephalic and atraumatic.  Mouth/Throat: Oropharynx is clear and moist. No oropharyngeal exudate.  Neck: No thyromegaly present.  Cardiovascular: Normal rate, regular rhythm and normal heart sounds.  No murmur heard. Pulmonary/Chest: Effort normal and breath sounds normal. No respiratory distress. She has no wheezes. She has no rales.  Abdominal: Soft. Bowel sounds are normal. She exhibits  no distension and no mass. There is no tenderness. There is no guarding.  Musculoskeletal: She exhibits no edema.  Lymphadenopathy:    She has no cervical adenopathy.  Neurological: She is alert and oriented to person, place, and time. She has normal reflexes. No cranial nerve deficit.  Skin: Skin is warm and dry. No rash noted. She is not diaphoretic. No erythema. No pallor.     LABORATORY DATA: I have personally reviewed the data as listed: Clinical Support on 06/06/2017  Component Date Value Ref Range Status  . CRP 06/06/2017 1.1* <1.0 mg/dL Final   Performed at [facility] [address], [place], [place] [address]  . Sed Rate 06/06/2017 19  0 - 22 mm/hr Final   Performed at [facility], [place] [address], [place], [place] [address]  . C3 Complement 06/06/2017 134  82 - 167 mg/dL Final  . Complement C4, Body Fluid 06/06/2017 36  14 - 44 mg/dL Final   Comment: (NOTE) Performed At: [facility] [place], [place] [id number] Rush Farmer MD UY:[id number] Performed at [facility], [place] [address], [place], [place] [address]   . Rhuematoid fact SerPl-aCnc 06/06/2017 <10.0  0.0 - 13.9 IU/mL Final   Comment: (NOTE) Performed At: [facility] [place], [place] [id number] Rush Farmer MD AC:[id number] Performed at [facility], [place] [address] [place], [place] [address]   . ANA Ab, IFA 06/06/2017 Negative   Final   Comment: (NOTE)                                     Negative   <1:80                                     Borderline  1:80                                     Positive   >1:80 Performed At: [facility] [place], [place] [id number] Rush Farmer MD RK:[id number] Performed at [facility], [place] [address], [place], [place] [address]   . HCV Ab 06/06/2017 <0.1  0.0 - 0.9 s/co ratio Final   Comment:  (NOTE) Performed At: [facility] [place], [place] [id number] Rush Farmer MD GY:[id number] Performed at [facility], [place] [address] [place], [place] [address]   . Hep B S Ab 06/06/2017 Non Reactive   Final   Comment: (NOTE)              Non Reactive: Inconsistent with immunity,                            less than 10 mIU/mL  Reactive:     Consistent with immunity,                            greater than 9.9 mIU/mL Performed At: [facility] [place], [place] [id number] Rush Farmer MD MB:[id number] Performed at [facility], [place] [address], [place], [place] [address]   . Hepatitis B Surface Ag 06/06/2017 Negative  Negative Final   Comment: (NOTE) Performed At: [facility] [place], [place] [id number] Rush Farmer MD GN:[id number] Performed at [facility], [place] [address], [place], [place] [address]   . Hep B Core Total Ab 06/06/2017 Negative  Negative Final   Comment: (NOTE) Performed At: [facility] [place], [place] [id number] Rush Farmer MD [id number]:[id number] Performed at [facility], [place] [address], [place], [place] [address]   . Uric Acid, Serum 06/06/2017 4.7  2.6 - 7.4 mg/dL Final   Performed at [facility], [place] [address] [place], [place] [address]  . Beta-2 Microglobulin 06/06/2017 2.1  0.6 - 2.4 mg/L Final   Comment: (NOTE) Siemens Immulite 2000 Immunochemiluminometric assay (ICMA) Values obtained with different assay methods or kits cannot be used interchangeably. Results cannot be interpreted as absolute evidence of the presence or absence of malignant disease. Performed At: [facility] [place], [place] [id number] Rush Farmer MD LJ:[id number] Performed at [facility], [place] [address], [place], [place] [address]   . LDH 06/06/2017 186  125 - 245 U/L Final   Performed at [facility], [place] [address], [place], [place] [address]  . Sodium 06/06/2017 139  136 - 145 mmol/L Final  . Potassium 06/06/2017 4.4  3.5 - 5.1 mmol/L Final  . Chloride 06/06/2017 101  98 - 109 mmol/L Final  . CO2 06/06/2017 28  22 - 29 mmol/L Final  . Glucose, Bld 06/06/2017 80  70 - 140 mg/dL Final  . BUN 06/06/2017 12  7 - 26 mg/dL Final  . Creatinine 06/06/2017 0.78  0.60 - 1.10 mg/dL Final  . Calcium 06/06/2017 9.7  8.4 - 10.4 mg/dL Final  . Total Protein 06/06/2017 7.3  6.4 - 8.3 g/dL Final  . Albumin 06/06/2017 4.0  3.5 - 5.0 g/dL Final  . AST 06/06/2017 18  5 - 34 U/L Final  . ALT 06/06/2017 15  0 - 55 U/L Final  . Alkaline Phosphatase 06/06/2017 72  40 - 150 U/L Final  . Total Bilirubin 06/06/2017 0.5  0.2 - 1.2 mg/dL Final  . GFR, Est Non Af Am 06/06/2017 >60  >60 mL/min Final  . GFR, Est AFR Am 06/06/2017 >60  >60 mL/min Final   Comment: (NOTE) The eGFR has been calculated using the CKD EPI equation. This calculation has not been validated in all clinical situations. eGFR's persistently <60 mL/min signify possible Chronic Kidney Disease.   Georgiann Hahn gap 06/06/2017 10  3 - 11 Final   Performed at [facility], [place] [address] [place], [place] [address]  . WBC Count 06/06/2017 7.5  3.9 - 10.3 K/uL Final  . RBC 06/06/2017 4.71  3.70 - 5.45 MIL/uL Final  . Hemoglobin 06/06/2017 15.0  11.6 - 15.9 g/dL Final  . HCT 06/06/2017 45.6  34.8 - 46.6 % Final  . MCV 06/06/2017 96.8  79.5 - 101.0 fL Final  . [facility] 06/06/2017  31.8  25.1 - 34.0 pg Final  . MCHC 06/06/2017 32.9  31.5 - 36.0 g/dL Final  . RDW 06/06/2017 13.6  11.2 - 14.5 % Final  . Platelet Count 06/06/2017 255  145 - 400 K/uL Final  . Neutrophils Relative % 06/06/2017 74  % Final  . Neutro Abs 06/06/2017 5.5  1.5 - 6.5 K/uL Final  . Lymphocytes Relative 06/06/2017 17  %  Final  . Lymphs Abs 06/06/2017 1.3  0.9 - 3.3 K/uL Final  . Monocytes Relative 06/06/2017 8  % Final  . Monocytes Absolute 06/06/2017 0.6  0.1 - 0.9 K/uL Final  . Eosinophils Relative 06/06/2017 1  % Final  . Eosinophils Absolute 06/06/2017 0.1  0.0 - 0.5 K/uL Final  . Basophils Relative 06/06/2017 0  % Final  . Basophils Absolute 06/06/2017 0.0  0.0 - 0.1 K/uL Final   Performed at [facility], [place] [address], [place], [place] [address]  . Comment: 06/06/2017 Comment   Final   Comment: (NOTE) Non reactive HCV antibody screen is consistent with no HCV infection, unless recent infection is suspected or other evidence exists to indicate HCV infection. Performed At: [facility] [place], [place] [id number] Rush Farmer MD PY:[id number] Performed at [facility], [place] [address], [place], [place] [address]          Ardath Sax, MD

## 2017-06-22 NOTE — Transfer of Care (Signed)
Immediate Anesthesia Transfer of Care Note  Patient: Kendra Ball  Procedure(s) Performed: RIGHT AXILLARY LYMPH NODE BIOPSY (Right )  Patient Location: PACU  Anesthesia Type:General  Level of Consciousness: awake, alert  and oriented  Airway & Oxygen Therapy: Patient Spontanous Breathing and Patient connected to face mask oxygen  Post-op Assessment: Report given to RN and Post -op Vital signs reviewed and stable  Post vital signs: Reviewed and stable  Last Vitals:  Vitals:   06/22/17 0625  BP: 128/65  Pulse: 76  Resp: 20  Temp: 37.5 C  SpO2: 94%    Last Pain:  Vitals:   06/22/17 0636  TempSrc:   PainSc: 4       Patients Stated Pain Goal: 4 (83/37/44 5146)  Complications: No apparent anesthesia complications

## 2017-06-22 NOTE — Op Note (Signed)
Preop diagnosis: Right axillary lymphadenopathy Postop diagnosis: Same Procedure performed: Right axillary lymph node biopsy Surgeon:Mirza Kidney K Jisela Merlino Anesthesia: General via LMA Indications:This is a 72 year old female who is a heavy smoker who presents with lymphadenopathy and right axilla. Last year the patient developed lymphadenopathy in cervical and supraclavicular regions. She was evaluated by ENT and they performed a right supraclavicular lymph node biopsy. Pathology was negative for malignancy. This showed atypical lymphoid proliferation. Recently she noticed more right axillary lymphadenopathy. She had a follow-up CT of the chest and neck on 05/30/17. This showed multiple right axillary lymph nodes that are enlarged and a single enlarged left axillary lymph node. This is concerning for lymphoma. She is now referred for surgical biopsy. She has seen Dr. Lebron Conners in oncology but his note is not yet available.  Description of procedure: The patient is brought to the operating room and placed in a supine position on the operating room table.  After an adequate level of general anesthesia was obtained, her right axilla was prepped with ChloraPrep and draped in sterile fashion.  A timeout was taken to ensure the proper patient and proper procedure.  She has a palpable mass in the lower anterior axilla.  This measures about 3 cm across.  I anesthetized this area with 0.25% Marcaine with epinephrine.  I then made a transverse incision across the axilla.  Dissection was carried down through the subcutaneous tissue to the surface of the mass.  This seems to be a very large lymph node at least 3-1/2 cm across.  I made the decision not to try to dissect out the entire large lymph node.  I excised a 1 cm area of tissue from the anterior surface of the mass using cautery.  This was sent for pathologic examination for lymph node protocol.  We inspected carefully for hemostasis.  The wound was closed with a  deep layer of 3-0 Vicryl and a subcuticular 4-0 Monocryl.  Benzoin Steri-Strips were applied.  The patient was then extubated and brought to the recovery room in stable condition.  All sponge, instrument, and needle counts are correct.  Imogene Burn. Georgette Dover, MD, [facility] Surgery  General/ Trauma Surgery  06/22/2017 9:45 AM

## 2017-06-22 NOTE — Interval H&P Note (Signed)
History and Physical Interval Note:  06/22/2017 8:55 AM  Kendra Ball  has presented today for surgery, with the diagnosis of Right axillary lymphadenopathy  The various methods of treatment have been discussed with the patient and family. After consideration of risks, benefits and other options for treatment, the patient has consented to  Procedure(s): RIGHT AXILLARY LYMPH NODE BIOPSY (Right) as a surgical intervention .  The patient's history has been reviewed, patient examined, no change in status, stable for surgery.  I have reviewed the patient's chart and labs.  Questions were answered to the patient's satisfaction.     Maia Petties

## 2017-06-23 ENCOUNTER — Encounter (HOSPITAL_COMMUNITY): Payer: Self-pay | Admitting: Surgery

## 2017-06-26 ENCOUNTER — Ambulatory Visit: Payer: Self-pay | Admitting: Hematology and Oncology

## 2017-06-29 ENCOUNTER — Inpatient Hospital Stay: Payer: Medicare Other | Attending: Hematology and Oncology | Admitting: Hematology and Oncology

## 2017-06-29 ENCOUNTER — Encounter: Payer: Self-pay | Admitting: Hematology and Oncology

## 2017-06-29 ENCOUNTER — Telehealth: Payer: Self-pay | Admitting: Hematology and Oncology

## 2017-06-29 VITALS — BP 107/50 | HR 74 | Temp 98.6°F | Resp 18 | Ht 64.0 in | Wt 168.9 lb

## 2017-06-29 DIAGNOSIS — L049 Acute lymphadenitis, unspecified: Secondary | ICD-10-CM

## 2017-06-29 DIAGNOSIS — R591 Generalized enlarged lymph nodes: Secondary | ICD-10-CM

## 2017-06-29 DIAGNOSIS — Z800 Family history of malignant neoplasm of digestive organs: Secondary | ICD-10-CM | POA: Diagnosis not present

## 2017-06-29 DIAGNOSIS — Z79899 Other long term (current) drug therapy: Secondary | ICD-10-CM | POA: Diagnosis not present

## 2017-06-29 DIAGNOSIS — Z7982 Long term (current) use of aspirin: Secondary | ICD-10-CM

## 2017-06-29 DIAGNOSIS — F17210 Nicotine dependence, cigarettes, uncomplicated: Secondary | ICD-10-CM | POA: Insufficient documentation

## 2017-06-29 DIAGNOSIS — R590 Localized enlarged lymph nodes: Secondary | ICD-10-CM | POA: Diagnosis present

## 2017-06-29 NOTE — Progress Notes (Signed)
[name] Follow-up Visit:  Assessment: Lymphadenopathy 72 y.o. female with minimal systemic symptoms, but radiographic evidence of bilateral axillary lymphadenopathy, mediastinal, and right supraclavicular lymphadenopathy.  Patient has undergone previous biopsy of supraclavicular lymph node which was nondiagnostic.  Repeat biopsy obtained from the right axilla also shows nondiagnostic sample without definitive evidence of malignancy, but with significant amount of necrosis present.  Lab work obtained prior demonstrates no significant hematological abnormalities, no elevation of LDH, or beta-2 microglobulin.  The only abnormal value is really mild elevation of CRP.  ANA, rheumatoid factor and complement 3 and 4 components appear to be within normal range.  Differential diagnosis continues to include lymphoproliferative disorder such as Hodgkin's lymphoma, reactive lymphoid proliferation, possible viral or fungal infection and autoimmune condition.  Plan: -At this time, I would like to reevaluate status of lymphadenopathy and include abdominal cavity into imaging by obtaining a PET/CT.  This would allow [place] to obtain additional possible targets for biopsy. -Return to clinic 1 week after PET/CT to review results and discuss further management.   Voice recognition software was used and creation of this note. Despite my best effort at editing the text, some misspelling/errors may have occurred.  Orders Placed This Encounter  Procedures  . NM PET Image Initial (PI) Skull Base To Thigh    Standing Status:   Future    Standing Expiration Date:   06/29/2018    Order Specific Question:   If indicated for the ordered procedure, I authorize the administration of a radiopharmaceutical per Radiology protocol    Answer:   Yes    Order Specific Question:   Preferred imaging location?    Answer:   [facility]    Order Specific Question:   Radiology Contrast Protocol - do NOT  remove file path    Answer:   \\[email or web address]    Order Specific Question:   Reason for Exam additional comments    Answer:   Lymphadenopathy, suspecting lymphoma, but the initial Bx non-diagnostic. Pelase val for poss additional Bx targets    Cancer Staging No matching staging information was found for the patient.  All questions were answered.  . The patient knows to call the clinic with any problems, questions or concerns.  This note was electronically signed.    History of Presenting Illness Kendra Ball is a 72 y.o. female followed in the [facility] for ongoing evaluation of axillary lymphadenopathy, referred by Dr [name].  The patient has initially presented to his office due intermittent voice hoarseness accompanying decreased appetite without significant weight change, fevers, chills, night sweats.  Otherwise, patient denies any shortness of breath, chest pain, or cough.  No nausea, early satiety, abdominal pain, diarrhea, or constipation.  No swelling in the armpits, neck, or groins.   Oncological/hematological History: --[place] Head/Neck, 02/18/17: Right hypoechoic supraclavicular adenopathy corresponding to palpable region, with at least 3 nodes, largest 1.3 x 1.1x1.4 cm; these were not evident on the prior CT. Two adjacent hypoechoic right occipital lymph nodes without normal fatty hilum, 0.4 and 0.6 cm short axis diameter, corresponding to palpable region. --Rt Supraclav LN Excisional Bx, 02/24/17: ATYPICAL LYMPHOID PROLIFERATION, NECROTIC SOFT TISSUE WITH ASSOCIATED ACUTE AND CHRONIC INFLAMMATION; The excisional biopsy is composed of necrotic soft tissue with associated inflammatory reaction. There are three adjacent lymph nodes which are slightly enlarged in size with enlarged lymphocytes. Immunohistochemistry was performed to exclude lymphoma. There is an admixture of B and T-cells with CD4-positive T-cells predominating. Bcl-6 has an  unusual staining pattern extending beyond the dendritic meshworks, which may represent the increased CD4 positive T-cells. The proliferative rate is low overall. By flow cytometry (see [id number]), there is no monoclonal B-cell or phenotypically aberrant T-cell population identified. Special stains for microorganisms (AFB, GMS, PAS and Warthin-Starry) are negative.  --CT Chest, 05/30/17: Multiple enlarged right axillary lymph nodes. Largest right axillary lymph node measures 2.6 x 2.2 cm. Single enlarged left axillary lymph node measures 16 mm in short axis. These enlarged lymph nodes are new since the prior CTs. There are no enlarged neck base lymph nodes. No mediastinal or hilar masses.  No mediastinal or hilar adenopathy. Trachea and esophagus are unremarkable. Stable benign calcified nodule in the left lower lobe superior segment adjacent to the oblique fissure. No evidence of pneumonia or pulmonary edema. No lung masses or suspicious nodules. Stable appearance from the prior exams.  --CT Neck, 06/03/17: No cervical lymphadenopathy.  Bilateral axillary lymphadenopathy noted, but not fully assessed due to limitations of the study. --Labs, 06/06/17: LDH 186, beta-2 microglobulin 2.1; CRP 1.1, ESR 19, ANA & RF -- negative, C3 134, C4 36; WBC 7.5, Hgb 15.0, Plt 255;  --Rt Axillary LN Excisional Bx, 06/22/17: FIBROADIPOSE TISSUE WITH NECROSIS, CHRONIC INFLAMMATION AND GRANULATION TISSUE. There is fibroadipose tissue with a nodular area of extensive necrosis with patchy, admixed partially necrotic cells most of which appear lymphoid. At the periphery there is inflamed granulation tissue and fibrosis. While tumor necrosis cannot be ruled out, the findings are not diagnostic of malignancy in this necrotic nodule.   No history exists.    Medical History: Past Medical History:  Diagnosis Date  . Adenopathy    right supraclavicular  . Anxiety   . Arthritis   . COPD (chronic obstructive pulmonary  disease) ([place])   . DEPRESSION 08/26/2008  . Hx of colonic polyps    First noted on  colonoscopy 2012  . Hypercholesteremia   . HYPERTENSION 08/26/2008  . HYPOTHYROIDISM 08/26/2008  . Obesity   . Osteoporosis   . Substance abuse [facility])    inpatient and outpatient tx for substance abuse  . TOBACCO USER 12/25/2009    Surgical History: Past Surgical History:  Procedure Laterality Date  . AXILLARY LYMPH NODE BIOPSY Right 06/22/2017   Procedure: RIGHT AXILLARY LYMPH NODE BIOPSY;  Surgeon: [name], MD;  Location: [facility];  Service: General;  Laterality: Right;  . [place] SURGERY  2006  . CESAREAN SECTION    . COLON RESECTION N/A 05/09/2014   Procedure: LAPAROSCOPIC HAND-ASSISTED EXTENDED RIGHT COLECTOMY, LAPAROSCOPIC LYSIS OF ADHESIONS, SPLENIC FLEXURE MOBILIZATION.;  Surgeon: [name], MD;  Location: [facility];  Service: General;  Laterality: N/A;  . DILATION AND CURETTAGE OF UTERUS     x2  . FOREARM FRACTURE SURGERY     right  . GASTRIC BYPASS  2006  . MASS EXCISION Right 02/24/2017   Procedure: EXCISIONAL BIOPSY RIGHT SUPRA-CLAVICULAR NODE;  Surgeon: [name], MD;  Location: [place];  Service: ENT;  Laterality: Right;  . TONSILLECTOMY      Family History: Family History  Problem Relation Age of Onset  . Cancer Mother        colon  . Dementia Mother   . Depression Mother   . Heart disease Father 80  . Heart disease Sister 93       stents, pacemaker  . Depression Sister   . Heart disease Brother 68       CABG, MI  . Depression Brother   .  Obesity Daughter   . Suicidality Daughter   . Bipolar disorder Daughter   . Obesity Son   . Mental illness Daughter        bipolar  . Obesity Daughter   . Depression Sister   . Dementia Paternal Aunt   . Alcohol abuse Maternal Uncle   . Colon cancer Maternal Aunt   . Stomach cancer Maternal Grandfather     Social History: Social History   Socioeconomic History  . Marital status: Married    Spouse name:  Not on file  . Number of children: 3  . Years of education: Not on file  . Highest education level: Not on file  Social Needs  . Financial resource strain: Not on file  . Food insecurity - worry: Not on file  . Food insecurity - inability: Not on file  . Transportation needs - medical: Not on file  . Transportation needs - non-medical: Not on file  Occupational History  . Occupation: RETIRED    Employer: [employer]  Tobacco Use  . Smoking status: Current Every Day Smoker    Packs/day: 1.00    Years: 50.00    Pack years: 50.00    Types: Cigarettes  . Smokeless tobacco: Never Used  Substance and Sexual Activity  . Alcohol use: No    Comment: quit on Aug 27, 2015  . Drug use: No    Comment: last used May 2017  . Sexual activity: Not Currently    Partners: Male    Birth control/protection: Post-menopausal  Other Topics Concern  . Not on file  Social History Narrative   Ms. Adel lives in [place] with her husband. She has 3 grown children and several grand children. She has joint custody of 3 of her grand children as her daughter has bipolar disorder and has needed assistance with children in past.    Allergies: Allergies  Allergen Reactions  . Morphine Sulfate Swelling and Other (See Comments)    Swelling around injection area    Medications:  Current Outpatient Medications  Medication Sig Dispense Refill  . albuterol (PROVENTIL HFA;VENTOLIN HFA) 108 (90 Base) MCG/ACT inhaler Inhale 2 puffs into the lungs every 6 (six) hours as needed for wheezing or shortness of breath. 1 Inhaler 2  . aspirin 81 MG tablet Take 81 mg by mouth daily.    . budesonide-formoterol (SYMBICORT) 160-4.5 MCG/ACT inhaler Inhale 2 puffs into the lungs 2 (two) times daily. 1 Inhaler 6  . buPROPion (WELLBUTRIN SR) 150 MG 12 hr tablet Take 1 tablet (150 mg total) by mouth 2 (two) times daily. 180 tablet 0  . busPIRone (BUSPAR) 10 MG tablet Take 1 tablet (10 mg total) by mouth 3 (three)  times daily. 270 tablet 0  . Cyanocobalamin (VITAMIN B-12 PO) Take 1 tablet by mouth daily.    . ferrous fumarate (HEMOCYTE - 106 MG FE) 325 (106 FE) MG TABS Take 1 tablet by mouth daily. Reported on 10/06/2015    . HYDROcodone-acetaminophen (NORCO/VICODIN) 5-325 MG tablet Take 1 tablet by mouth every 6 (six) hours as needed for moderate pain. 12 tablet 0  . levothyroxine (SYNTHROID, LEVOTHROID) 100 MCG tablet Take 1 tablet (100 mcg total) by mouth daily before breakfast. 90 tablet 3  . lisinopril (PRINIVIL,ZESTRIL) 10 MG tablet Take 1 tablet (10 mg total) by mouth daily. 90 tablet 1  . Magnesium 250 MG TABS Take 250 mg by mouth 2 (two) times daily.     . simvastatin (ZOCOR) 10 MG tablet  Take 1 tablet (10 mg total) by mouth at bedtime. 90 tablet 3  . traZODone (DESYREL) 150 MG tablet Take 1 tablet (150 mg total) by mouth at bedtime. 90 tablet 0  . venlafaxine XR (EFFEXOR-XR) 75 MG 24 hr capsule TAKE ONE CAPSULE BY MOUTH EVERY DAY WITH BREAKFAST (Patient taking differently: Take 75 mg by mouth daily with breakfast. ) 90 capsule 0   No current facility-administered medications for this visit.     Review of Systems: Review of Systems  Constitutional: Positive for appetite change. Negative for chills, diaphoresis, fever and unexpected weight change.  HENT:   Positive for voice change.   All other systems reviewed and are negative.    PHYSICAL EXAMINATION Blood pressure (!) 107/50, pulse 74, temperature 98.6 F (37 C), temperature source Oral, resp. rate 18, height 5' 4"  (1.626 m), weight 168 lb 14.4 oz (76.6 kg), SpO2 98 %.  ECOG PERFORMANCE STATUS: 2 - Symptomatic, <50% confined to bed  Physical Exam  Constitutional: She is oriented to person, place, and time and well-developed, well-nourished, and in no distress. No distress.  HENT:  Head: Normocephalic and atraumatic.  Mouth/Throat: Oropharynx is clear and moist. No oropharyngeal exudate.  Neck: No thyromegaly present.   Cardiovascular: Normal rate, regular rhythm and normal heart sounds.  No murmur heard. Pulmonary/Chest: Effort normal and breath sounds normal. No respiratory distress. She has no wheezes. She has no rales.  Abdominal: Soft. Bowel sounds are normal. She exhibits no distension and no mass. There is no tenderness. There is no guarding.  Musculoskeletal: She exhibits no edema.  Lymphadenopathy:    She has no cervical adenopathy.  Neurological: She is alert and oriented to person, place, and time. She has normal reflexes. No cranial nerve deficit.  Skin: Skin is warm and dry. No rash noted. She is not diaphoretic. No erythema. No pallor.     LABORATORY DATA: I have personally reviewed the data as listed: No visits with results within 1 Week(s) from this visit.  Latest known visit with results is:  Clinical Support on 06/06/2017  Component Date Value Ref Range Status  . CRP 06/06/2017 1.1* <1.0 mg/dL Final   Performed at [facility] [address], [place], [place] [address]  . Sed Rate 06/06/2017 19  0 - 22 mm/hr Final   Performed at [facility], [place] [address], [place], [place] [address]  . C3 Complement 06/06/2017 134  82 - 167 mg/dL Final  . Complement C4, Body Fluid 06/06/2017 36  14 - 44 mg/dL Final   Comment: (NOTE) Performed At: [facility] [place], [place] [id number] Rush Farmer MD [id number] Performed at [facility], [place] [address] [place], [place] [address]   . Rhuematoid fact SerPl-aCnc 06/06/2017 <10.0  0.0 - 13.9 IU/mL Final   Comment: (NOTE) Performed At: [facility] [place], [place] [id number] Rush Farmer MD [id number] Performed at [facility], [place] [address] [place],  [address]   . ANA Ab, IFA 06/06/2017 Negative   Final   Comment: (NOTE)                                     Negative   <1:80  Borderline  1:80                                     Positive   >1:80 Performed At: [facility] [place], [place] [id number] Rush Farmer MD [id number] Performed at [facility], [place] [address], [place], [place] [address]   . HCV Ab 06/06/2017 <0.1  0.0 - 0.9 s/co ratio Final   Comment: (NOTE) Performed At: [facility] [place], [place] [id number] Rush Farmer MD [id number] Performed at [facility], [place] [address] [place], [place] [address]   . Hep B S Ab 06/06/2017 Non Reactive   Final   Comment: (NOTE)              Non Reactive: Inconsistent with immunity,                            less than 10 mIU/mL              Reactive:     Consistent with immunity,                            greater than 9.9 mIU/mL Performed At: [facility] [place], [place] [id number] Rush Farmer MD [id number] Performed at [facility], [place] [address] [place], [place] [address]   . Hepatitis B Surface Ag 06/06/2017 Negative  Negative Final   Comment: (NOTE) Performed At: [facility] [place], [place] [id number] Rush Farmer MD [id number] Performed at [facility], [place] [address], [place], [place] [address]   . Hep B Core Total Ab 06/06/2017 Negative  Negative Final   Comment: (NOTE) Performed At: [facility] [place], [place] [id number] Rush Farmer MD [id number] Performed at [facility], [place] [address], [place], [place] [address]   . Uric Acid, Serum 06/06/2017 4.7  2.6 - 7.4 mg/dL Final   Performed at [facility], [place] [address] [place], [place] [address]  . Beta-2 Microglobulin 06/06/2017 2.1  0.6 - 2.4 mg/L Final   Comment: (NOTE) Siemens Immulite 2000 Immunochemiluminometric assay  (ICMA) Values obtained with different assay methods or kits cannot be used interchangeably. Results cannot be interpreted as absolute evidence of the presence or absence of malignant disease. Performed At: [facility] [place], [place] [id number] Rush Farmer MD [id number] Performed at [facility], [place] [address], [place], [place] [address]   . LDH 06/06/2017 186  125 - 245 U/L Final   Performed at [facility], [place] [address] [place],  [address]  . Sodium 06/06/2017 139  136 - 145 mmol/L Final  . Potassium 06/06/2017 4.4  3.5 - 5.1 mmol/L Final  . Chloride 06/06/2017 101  98 - 109 mmol/L Final  . CO2 06/06/2017 28  22 - 29 mmol/L Final  . Glucose, Bld 06/06/2017 80  70 - 140 mg/dL Final  . BUN 06/06/2017 12  7 - 26 mg/dL Final  . Creatinine 06/06/2017 0.78  0.60 - 1.10 mg/dL Final  . Calcium 06/06/2017 9.7  8.4 - 10.4 mg/dL Final  . Total Protein 06/06/2017 7.3  6.4 - 8.3 g/dL Final  . Albumin 06/06/2017 4.0  3.5 - 5.0 g/dL Final  . AST 06/06/2017 18  5 - 34 U/L Final  . ALT 06/06/2017 15  0 - 55 U/L Final  . Alkaline Phosphatase 06/06/2017 72  40 - 150 U/L Final  . Total Bilirubin 06/06/2017 0.5  0.2 - 1.2 mg/dL Final  . GFR, Est Non Af Am 06/06/2017 >60  >60 mL/min Final  . GFR, Est AFR Am 06/06/2017 >60  >60 mL/min Final   Comment: (NOTE) The eGFR has been calculated using the CKD EPI equation. This calculation has not been validated in all clinical situations. eGFR's persistently <60 mL/min signify possible Chronic Kidney Disease.   Georgiann Hahn gap 06/06/2017 10  3 - 11 Final   Performed at [facility], [place] [address] [place], [place] [address]  . WBC Count 06/06/2017 7.5  3.9 - 10.3 K/uL Final  . RBC 06/06/2017 4.71  3.70 - 5.45 MIL/uL Final  . Hemoglobin 06/06/2017 15.0  11.6 - 15.9 g/dL Final  . HCT 06/06/2017 45.6  34.8 - 46.6 % Final  . MCV 06/06/2017 96.8   79.5 - 101.0 fL Final  . MCH 06/06/2017 31.8  25.1 - 34.0 pg Final  . MCHC 06/06/2017 32.9  31.5 - 36.0 g/dL Final  . RDW 06/06/2017 13.6  11.2 - 14.5 % Final  . Platelet Count 06/06/2017 255  145 - 400 K/uL Final  . Neutrophils Relative % 06/06/2017 74  % Final  . Neutro Abs 06/06/2017 5.5  1.5 - 6.5 K/uL Final  . Lymphocytes Relative 06/06/2017 17  % Final  . Lymphs Abs 06/06/2017 1.3  0.9 - 3.3 K/uL Final  . Monocytes Relative 06/06/2017 8  % Final  . Monocytes Absolute 06/06/2017 0.6  0.1 - 0.9 K/uL Final  . Eosinophils Relative 06/06/2017 1  % Final  . Eosinophils Absolute 06/06/2017 0.1  0.0 - 0.5 K/uL Final  . Basophils Relative 06/06/2017 0  % Final  . Basophils Absolute 06/06/2017 0.0  0.0 - 0.1 K/uL Final   Performed at [facility], [place] [address], [place], [place] [address]  . Comment: 06/06/2017 Comment   Final   Comment: (NOTE) Non reactive HCV antibody screen is consistent with no HCV infection, unless recent infection is suspected or other evidence exists to indicate HCV infection. Performed At: [facility] [place], [place] [id number] Rush Farmer MD [id number] Performed at [facility], [place] [address] [place], [place] [address]        Ardath Sax, MD

## 2017-06-29 NOTE — Telephone Encounter (Signed)
Scheduled appts per 3/7 los - patient did not want avs or calendar.  °

## 2017-06-29 NOTE — Assessment & Plan Note (Signed)
72 y.o. female with minimal systemic symptoms, but radiographic evidence of bilateral axillary lymphadenopathy, mediastinal, and right supraclavicular lymphadenopathy.  Patient has undergone previous biopsy of supraclavicular lymph node which was nondiagnostic.  Repeat biopsy obtained from the right axilla also shows nondiagnostic sample without definitive evidence of malignancy, but with significant amount of necrosis present.  Lab work obtained prior demonstrates no significant hematological abnormalities, no elevation of LDH, or beta-2 microglobulin.  The only abnormal value is really mild elevation of CRP.  ANA, rheumatoid factor and complement 3 and 4 components appear to be within normal range.  Differential diagnosis continues to include lymphoproliferative disorder such as Hodgkin's lymphoma, reactive lymphoid proliferation, possible viral or fungal infection and autoimmune condition.  Plan: -At this time, I would like to reevaluate status of lymphadenopathy and include abdominal cavity into imaging by obtaining a PET/CT.  This would allow [place] to obtain additional possible targets for biopsy. -Return to clinic 1 week after PET/CT to review results and discuss further management.

## 2017-07-06 ENCOUNTER — Encounter (HOSPITAL_COMMUNITY): Payer: Self-pay | Admitting: Psychiatry

## 2017-07-06 ENCOUNTER — Ambulatory Visit (INDEPENDENT_AMBULATORY_CARE_PROVIDER_SITE_OTHER): Payer: Medicare Other | Admitting: Psychiatry

## 2017-07-06 VITALS — BP 128/76 | HR 74 | Ht 64.0 in | Wt 167.0 lb

## 2017-07-06 DIAGNOSIS — F1020 Alcohol dependence, uncomplicated: Secondary | ICD-10-CM

## 2017-07-06 DIAGNOSIS — F99 Mental disorder, not otherwise specified: Secondary | ICD-10-CM | POA: Diagnosis not present

## 2017-07-06 DIAGNOSIS — F5105 Insomnia due to other mental disorder: Secondary | ICD-10-CM | POA: Diagnosis not present

## 2017-07-06 DIAGNOSIS — F1021 Alcohol dependence, in remission: Secondary | ICD-10-CM | POA: Diagnosis not present

## 2017-07-06 DIAGNOSIS — T65224A Toxic effect of tobacco cigarettes, undetermined, initial encounter: Secondary | ICD-10-CM

## 2017-07-06 DIAGNOSIS — F331 Major depressive disorder, recurrent, moderate: Secondary | ICD-10-CM

## 2017-07-06 DIAGNOSIS — Z818 Family history of other mental and behavioral disorders: Secondary | ICD-10-CM

## 2017-07-06 DIAGNOSIS — F411 Generalized anxiety disorder: Secondary | ICD-10-CM

## 2017-07-06 DIAGNOSIS — Z810 Family history of intellectual disabilities: Secondary | ICD-10-CM | POA: Diagnosis not present

## 2017-07-06 DIAGNOSIS — F17210 Nicotine dependence, cigarettes, uncomplicated: Secondary | ICD-10-CM | POA: Diagnosis not present

## 2017-07-06 DIAGNOSIS — Z811 Family history of alcohol abuse and dependence: Secondary | ICD-10-CM | POA: Diagnosis not present

## 2017-07-06 MED ORDER — BUSPIRONE HCL 10 MG PO TABS: 10.0000 mg | ORAL_TABLET | Freq: Three times a day (TID) | ORAL | 0 refills | Status: DC

## 2017-07-06 MED ORDER — BUPROPION HCL ER (SR) 150 MG PO TB12: 150.0000 mg | ORAL_TABLET | Freq: Two times a day (BID) | ORAL | 0 refills | Status: DC

## 2017-07-06 MED ORDER — VENLAFAXINE HCL ER 75 MG PO CP24: 75.0000 mg | ORAL_CAPSULE | Freq: Every day | ORAL | 0 refills | Status: DC

## 2017-07-06 MED ORDER — TRAZODONE HCL 100 MG PO TABS: 200.0000 mg | ORAL_TABLET | Freq: Every day | ORAL | 0 refills | Status: DC

## 2017-07-06 NOTE — Progress Notes (Signed)
[place] MD/PA/NP OP Progress Note  07/06/2017 11:28 AM Kendra Ball  MRN:  [id number]  Chief Complaint:  Chief Complaint    Depression; Follow-up     HPI: "I'm really not sure how I am doing".  Pt has lumps in her underarms and neck. She has had 2 biopsies that were negative. Pt needs a PET scan. This is making her anxious and a little down. Pt does not think she is depressed. She still goes out 3x/week and denies isolation. Pt denies anhedonia and low motivation. She finds she does better on days she goes out. She denies SI/HI. Pt has a lot of social support and is very religous.   Pt is waking up 2-3x/night and feels tired all day. She is no longer feeling rested with Trazodone. Pt is denying racing thoughts. Pt is napping 2-3x/day due to excessive fatigue.  She is getting about 5 hrs at night and 3 hrs/day. Pt states the change in sleep did happen to coiencide with the findings of the lumps.  Pt denies alcohol use. She still goes to [employer] and meets with her sponsor.  Pt states-taking meds as prescribed and denies SE.   Visit Diagnosis:    ICD-10-CM   1. GAD (generalized anxiety disorder) F41.1 busPIRone (BUSPAR) 10 MG tablet    venlafaxine XR (EFFEXOR-XR) 75 MG 24 hr capsule  2. Toxic effect of tobacco cigarette, undetermined intent, initial encounter T65.224A buPROPion (WELLBUTRIN SR) 150 MG 12 hr tablet  3. Alcohol use disorder, moderate, dependence (HCC) F10.20 busPIRone (BUSPAR) 10 MG tablet    traZODone (DESYREL) 100 MG tablet  4. MDD (major depressive disorder), recurrent episode, moderate (HCC) F33.1 busPIRone (BUSPAR) 10 MG tablet    traZODone (DESYREL) 100 MG tablet    venlafaxine XR (EFFEXOR-XR) 75 MG 24 hr capsule  5. Insomnia due to other mental disorder F51.05 traZODone (DESYREL) 100 MG tablet   F99       Past Psychiatric History:  Anxiety:Yes Bipolar Disorder:No Depression:Yes Mania:No Psychosis:No Schizophrenia:No Personality  Disorder:No Hospitalization for psychiatric illness:No History of Electroconvulsive Shock Therapy:No Prior Suicide Attempts:No    Past Medical History:  Past Medical History:  Diagnosis Date  . Adenopathy    right supraclavicular  . Anxiety   . Arthritis   . COPD (chronic obstructive pulmonary disease) ([place])   . DEPRESSION 08/26/2008  . Hx of colonic polyps    First noted on  colonoscopy 2012  . Hypercholesteremia   . HYPERTENSION 08/26/2008  . HYPOTHYROIDISM 08/26/2008  . Obesity   . Osteoporosis   . Substance abuse [facility])    inpatient and outpatient tx for substance abuse  . TOBACCO USER 12/25/2009    Past Surgical History:  Procedure Laterality Date  . AXILLARY LYMPH NODE BIOPSY Right 06/22/2017   Procedure: RIGHT AXILLARY LYMPH NODE BIOPSY;  Surgeon: [name], MD;  Location: [facility];  Service: General;  Laterality: Right;  . [place] SURGERY  2006  . CESAREAN SECTION    . COLON RESECTION N/A 05/09/2014   Procedure: LAPAROSCOPIC HAND-ASSISTED EXTENDED RIGHT COLECTOMY, LAPAROSCOPIC LYSIS OF ADHESIONS, SPLENIC FLEXURE MOBILIZATION.;  Surgeon: [name], MD;  Location: [facility];  Service: General;  Laterality: N/A;  . DILATION AND CURETTAGE OF UTERUS     x2  . FOREARM FRACTURE SURGERY     right  . GASTRIC BYPASS  2006  . MASS EXCISION Right 02/24/2017   Procedure: EXCISIONAL BIOPSY RIGHT SUPRA-CLAVICULAR NODE;  Surgeon: [name], MD;  Location: [facility];  Service: ENT;  Laterality: Right;  . TONSILLECTOMY      Family Psychiatric History:  Family History  Problem Relation Age of Onset  . Cancer Mother        colon  . Dementia Mother   . Depression Mother   . Heart disease Father 39  . Heart disease Sister 90       stents, pacemaker  . Depression Sister   . Heart disease Brother 21       CABG, MI  . Depression Brother   . Obesity Daughter   . Suicidality Daughter   . Bipolar disorder Daughter   . Obesity Son   . Mental illness  Daughter        bipolar  . Obesity Daughter   . Depression Sister   . Dementia Paternal Aunt   . Alcohol abuse Maternal Uncle   . Colon cancer Maternal Aunt   . Stomach cancer Maternal Grandfather     Social History:  Social History   Socioeconomic History  . Marital status: Married    Spouse name: None  . Number of children: 3  . Years of education: None  . Highest education level: None  Social Needs  . Financial resource strain: None  . Food insecurity - worry: None  . Food insecurity - inability: None  . Transportation needs - medical: None  . Transportation needs - non-medical: None  Occupational History  . Occupation: RETIRED    Employer: [employer]  Tobacco Use  . Smoking status: Current Every Day Smoker    Packs/day: 1.00    Years: 50.00    Pack years: 50.00    Types: Cigarettes  . Smokeless tobacco: Never Used  Substance and Sexual Activity  . Alcohol use: No    Comment: quit on Aug 27, 2015  . Drug use: No    Comment: last used May 2017  . Sexual activity: Not Currently    Partners: Male    Birth control/protection: Post-menopausal  Other Topics Concern  . None  Social History Narrative   Ms. Hastings lives in [place] with her husband. She has 3 grown children and several grand children. She has joint custody of 3 of her grand children as her daughter has bipolar disorder and has needed assistance with children in past.    Allergies:  Allergies  Allergen Reactions  . Morphine Sulfate Swelling and Other (See Comments)    Swelling around injection area    Metabolic Disorder Labs: Lab Results  Component Value Date   HGBA1C 5.2 12/06/2016   MPG 111 05/10/2014   No results found for: PROLACTIN Lab Results  Component Value Date   CHOL 164 12/06/2016   TRIG 95.0 12/06/2016   HDL 70.50 12/06/2016   CHOLHDL 2 12/06/2016   VLDL 19.0 12/06/2016   LDLCALC 74 12/06/2016   LDLCALC 82 10/21/2015   Lab Results  Component Value Date   TSH  0.65 12/06/2016   TSH 2.24 01/22/2016    Therapeutic Level Labs: No results found for: LITHIUM No results found for: VALPROATE No components found for:  CBMZ  Current Medications: Current Outpatient Medications  Medication Sig Dispense Refill  . albuterol (PROVENTIL HFA;VENTOLIN HFA) 108 (90 Base) MCG/ACT inhaler Inhale 2 puffs into the lungs every 6 (six) hours as needed for wheezing or shortness of breath. 1 Inhaler 2  . aspirin 81 MG tablet Take 81 mg by mouth daily.    . budesonide-formoterol (SYMBICORT) 160-4.5 MCG/ACT inhaler Inhale 2 puffs into  the lungs 2 (two) times daily. 1 Inhaler 6  . buPROPion (WELLBUTRIN SR) 150 MG 12 hr tablet Take 1 tablet (150 mg total) by mouth 2 (two) times daily. 180 tablet 0  . busPIRone (BUSPAR) 10 MG tablet Take 1 tablet (10 mg total) by mouth 3 (three) times daily. 270 tablet 0  . Cyanocobalamin (VITAMIN B-12 PO) Take 1 tablet by mouth daily.    . ferrous fumarate (HEMOCYTE - 106 MG FE) 325 (106 FE) MG TABS Take 1 tablet by mouth daily. Reported on 10/06/2015    . HYDROcodone-acetaminophen (NORCO/VICODIN) 5-325 MG tablet Take 1 tablet by mouth every 6 (six) hours as needed for moderate pain. 12 tablet 0  . levothyroxine (SYNTHROID, LEVOTHROID) 100 MCG tablet Take 1 tablet (100 mcg total) by mouth daily before breakfast. 90 tablet 3  . lisinopril (PRINIVIL,ZESTRIL) 10 MG tablet Take 1 tablet (10 mg total) by mouth daily. 90 tablet 1  . Magnesium 250 MG TABS Take 250 mg by mouth 2 (two) times daily.     . simvastatin (ZOCOR) 10 MG tablet Take 1 tablet (10 mg total) by mouth at bedtime. 90 tablet 3  . traZODone (DESYREL) 100 MG tablet Take 2 tablets (200 mg total) by mouth at bedtime. 180 tablet 0  . venlafaxine XR (EFFEXOR-XR) 75 MG 24 hr capsule Take 1 capsule (75 mg total) by mouth daily with breakfast. 90 capsule 0   No current facility-administered medications for this visit.      Musculoskeletal: Strength & Muscle Tone: within normal  limits Gait & Station: normal Patient leans: N/A  Psychiatric Specialty Exam: Review of Systems  Constitutional: Positive for malaise/fatigue. Negative for chills and fever.  Neurological: Negative for weakness.  Psychiatric/Behavioral: Negative for depression, hallucinations, substance abuse and suicidal ideas. The patient has insomnia.     Blood pressure 128/76, pulse 74, height 5\' 4"  (1.626 m), weight 167 lb (75.8 kg).Body mass index is 28.67 kg/m.  General Appearance: Casual  Eye Contact:  Good  Speech:  Clear and Coherent and Normal Rate  Volume:  Normal  Mood:  Euthymic  Affect:  Full Range  Thought Process:  Goal Directed and Descriptions of Associations: Intact  Orientation:  Full (Time, Place, and Person)  Thought Content: Rumination   Suicidal Thoughts:  No  Homicidal Thoughts:  No  Memory:  Immediate;   Good Recent;   Good Remote;   Good  Judgement:  Good  Insight:  Good  Psychomotor Activity:  Normal  Concentration:  Concentration: Good and Attention Span: Good  Recall:  Good  Fund of Knowledge: Good  Language: Good  Akathisia:  No  Handed:  Right  AIMS (if indicated): not done  Assets:  Communication Skills Desire for Improvement Financial Resources/Insurance Housing Resilience  ADL's:  Intact  Cognition: WNL  Sleep:  Poor   Screenings: AUDIT     Counselor from 09/10/2015 in [place]  Alcohol Use Disorder Identification Test Final Score (AUDIT)  34    CAGE-AID     Counselor from 09/10/2015 in [place] Score  4    GAD-7     Counselor from 09/14/2015 in [place]  Total GAD-7 Score  7    PHQ2-9     Office Visit from 02/16/2017 in [facility] Visit from 12/12/2016 in [place] from 11/05/2015 in [place] Office Visit from  10/16/2015 in [facility] [place] Counselor from 09/10/2015 in [place]  PHQ-2 Total Score  0  2  2  0  2  PHQ-9 Total Score  No data  6  9  No data  9       Assessment and Plan: MDD-recurrent, moderate; GAD; insomnia; alcohol use disorder in remission; nicotine dependence    Medication management with supportive therapy. Risks and benefits, side effects and alternative treatment options discussed with patient. Pt was given an opportunity to ask questions about medication, illness, and treatment. All current psychiatric medications have been reviewed and discussed with the patient and adjusted as clinically appropriate. The patient has been provided an accurate and updated list of the medications being now prescribed. Patient expressed understanding of how their medications were to be used.  Pt verbalized understanding and verbal consent obtained for treatment.  The risk of un-intended pregnancy is low based on the fact that she is postmenopausal. Pt is aware that these meds carry a teratogenic risk. Pt will discuss plan of action if she does or plans to become pregnant in the future.  Status of current problems: worsening insomnia, other problems stable  Meds: Effexor XR 75 mg p.o. daily for MDD and GAD Wellbutrin SR 150 mg p.o. twice daily for MDD.  It is not effective for smoking cessation Increase Trazodone 200 mg p.o. nightly for insomnia BuSpar 10 mg p.o. 3 times daily for GAD   Labs: none  Therapy: brief supportive therapy provided. Discussed psychosocial stressors in detail.     Consultations: Encouraged to follow up with PCP as needed  Pt denies SI and is at an acute low risk for suicide. Patient told to call clinic if any problems occur. Patient advised to go to ER if they should develop SI/HI, side effects, or if symptoms worsen. Has crisis numbers to call if needed. Pt verbalized understanding.  F/up in 4 months or sooner  if needed  [name], MD 07/06/2017, 11:28 AM

## 2017-07-13 ENCOUNTER — Ambulatory Visit (HOSPITAL_COMMUNITY): Payer: Medicare Other

## 2017-07-19 ENCOUNTER — Ambulatory Visit (HOSPITAL_COMMUNITY)
Admission: RE | Admit: 2017-07-19 | Discharge: 2017-07-19 | Disposition: A | Discharge status: Home / Self Care | Payer: Medicare Other | Source: Ambulatory Visit | Attending: Hematology and Oncology | Admitting: Hematology and Oncology

## 2017-07-19 DIAGNOSIS — M4316 Spondylolisthesis, lumbar region: Secondary | ICD-10-CM | POA: Insufficient documentation

## 2017-07-19 DIAGNOSIS — D71 Functional disorders of polymorphonuclear neutrophils: Secondary | ICD-10-CM | POA: Insufficient documentation

## 2017-07-19 DIAGNOSIS — L049 Acute lymphadenitis, unspecified: Secondary | ICD-10-CM | POA: Diagnosis not present

## 2017-07-19 DIAGNOSIS — R9389 Abnormal findings on diagnostic imaging of other specified body structures: Secondary | ICD-10-CM | POA: Insufficient documentation

## 2017-07-19 DIAGNOSIS — K8020 Calculus of gallbladder without cholecystitis without obstruction: Secondary | ICD-10-CM | POA: Diagnosis not present

## 2017-07-19 DIAGNOSIS — Z79899 Other long term (current) drug therapy: Secondary | ICD-10-CM | POA: Diagnosis not present

## 2017-07-19 DIAGNOSIS — I2510 Atherosclerotic heart disease of native coronary artery without angina pectoris: Secondary | ICD-10-CM | POA: Insufficient documentation

## 2017-07-19 DIAGNOSIS — J341 Cyst and mucocele of nose and nasal sinus: Secondary | ICD-10-CM | POA: Insufficient documentation

## 2017-07-19 DIAGNOSIS — Z79890 Hormone replacement therapy (postmenopausal): Secondary | ICD-10-CM | POA: Diagnosis not present

## 2017-07-19 DIAGNOSIS — Z7982 Long term (current) use of aspirin: Secondary | ICD-10-CM | POA: Insufficient documentation

## 2017-07-19 DIAGNOSIS — I700 Atherosclerosis of aorta: Secondary | ICD-10-CM | POA: Insufficient documentation

## 2017-07-19 LAB — GLUCOSE, CAPILLARY: Glucose-Capillary: 87 mg/dL (ref 65–99)

## 2017-07-19 MED ORDER — FLUDEOXYGLUCOSE F - 18 (FDG) INJECTION
8.3000 | Freq: Once | INTRAVENOUS | Status: AC | PRN
  Administered 2017-07-19: 8.3 via INTRAVENOUS

## 2017-07-20 ENCOUNTER — Ambulatory Visit: Payer: Self-pay | Admitting: Hematology and Oncology

## 2017-07-20 ENCOUNTER — Other Ambulatory Visit (HOSPITAL_COMMUNITY): Payer: Self-pay | Admitting: Psychiatry

## 2017-07-20 DIAGNOSIS — F1020 Alcohol dependence, uncomplicated: Secondary | ICD-10-CM

## 2017-07-20 DIAGNOSIS — F331 Major depressive disorder, recurrent, moderate: Secondary | ICD-10-CM

## 2017-07-20 DIAGNOSIS — F411 Generalized anxiety disorder: Secondary | ICD-10-CM

## 2017-07-26 ENCOUNTER — Inpatient Hospital Stay: Payer: Medicare Other | Attending: Hematology and Oncology | Admitting: Hematology and Oncology

## 2017-07-26 ENCOUNTER — Encounter: Payer: Self-pay | Admitting: Hematology and Oncology

## 2017-07-26 ENCOUNTER — Telehealth: Payer: Self-pay | Admitting: Hematology and Oncology

## 2017-07-26 VITALS — BP 120/50 | HR 71 | Temp 98.3°F | Resp 18 | Ht 64.0 in | Wt 166.6 lb

## 2017-07-26 DIAGNOSIS — R591 Generalized enlarged lymph nodes: Secondary | ICD-10-CM | POA: Insufficient documentation

## 2017-07-26 DIAGNOSIS — Z800 Family history of malignant neoplasm of digestive organs: Secondary | ICD-10-CM | POA: Diagnosis not present

## 2017-07-26 DIAGNOSIS — F17210 Nicotine dependence, cigarettes, uncomplicated: Secondary | ICD-10-CM

## 2017-07-26 DIAGNOSIS — Z79899 Other long term (current) drug therapy: Secondary | ICD-10-CM | POA: Insufficient documentation

## 2017-07-26 DIAGNOSIS — Z7982 Long term (current) use of aspirin: Secondary | ICD-10-CM | POA: Insufficient documentation

## 2017-07-26 DIAGNOSIS — R590 Localized enlarged lymph nodes: Secondary | ICD-10-CM | POA: Diagnosis present

## 2017-07-26 NOTE — Progress Notes (Signed)
[place] [facility] Follow-up Visit:  Assessment: Lymphadenopathy 72 y.o. female with minimal systemic symptoms, but radiographic evidence of bilateral axillary lymphadenopathy, mediastinal, and right supraclavicular lymphadenopathy.  Patient has undergone previous biopsy of supraclavicular lymph node which was nondiagnostic.  Repeat biopsy obtained from the right axilla also shows nondiagnostic sample without definitive evidence of malignancy, but with significant amount of necrosis present.  Lab work obtained prior demonstrates no significant hematological abnormalities, no elevation of LDH, or beta-2 microglobulin.  The only abnormal value is really mild elevation of CRP.  ANA, rheumatoid factor and complement 3 and 4 components appear to be within normal range.  Differential diagnosis continues to include lymphoproliferative disorder such as Hodgkin's lymphoma, reactive lymphoid proliferation, possible viral or fungal infection and autoimmune condition.  PET/CT continues to demonstrate hypermetabolic lymphadenopathy in the right axilla, right supraclavicular region, left axilla, and a single omental nodule.  The most active process is in the right axilla which has been previously biopsied without definitive diagnosis.  No new targets for biopsy discovered at this time.  Plan: -Initiate monitoring. -Return to clinic in 3 months with CT of the neck/chest/abdomen/pelvis and lab work prior to the clinic visit.   Voice recognition software was used and creation of this note. Despite my best effort at editing the text, some misspelling/errors may have occurred.  Orders Placed This Encounter  Procedures  . CT Abdomen Pelvis W Contrast    Standing Status:   Future    Standing Expiration Date:   07/26/2018    Order Specific Question:   If indicated for the ordered procedure, I authorize the administration of contrast media per Radiology protocol    Answer:   Yes    Order Specific  Question:   Preferred imaging location?    Answer:   [facility]    Order Specific Question:   Radiology Contrast Protocol - do NOT remove file path    Answer:   _0 [email or web address]    Order Specific Question:   Reason for Exam additional comments    Answer:   BL axillary LAD w/o definitive diagnosis, please eval for interval change  . CT Chest W Contrast    Standing Status:   Future    Standing Expiration Date:   07/26/2018    Order Specific Question:   If indicated for the ordered procedure, I authorize the administration of contrast media per Radiology protocol    Answer:   Yes    Order Specific Question:   Preferred imaging location?    Answer:   [facility]    Order Specific Question:   Radiology Contrast Protocol - do NOT remove file path    Answer:   _1 [email or web address]    Order Specific Question:   Reason for Exam additional comments    Answer:   BL axillary LAD w/o definitive diagnosis, please eval for interval change  . CT Soft Tissue Neck W Contrast    Standing Status:   Future    Standing Expiration Date:   07/26/2018    Order Specific Question:   If indicated for the ordered procedure, I authorize the administration of contrast media per Radiology protocol    Answer:   Yes    Order Specific Question:   Preferred imaging location?    Answer:   [facility]    Order Specific Question:   Radiology Contrast Protocol - do NOT remove file path    Answer:   _2 [email or web address]    Order Specific  Question:   Reason for Exam additional comments    Answer:   BL axillary LAD w/o definitive diagnosis, please eval for interval change  . CBC with Differential (Cancer Center Only)    Standing Status:   Future    Standing Expiration Date:   07/27/2018  . CMP ([place] only)    Standing Status:   Future    Standing Expiration Date:   07/27/2018  . Lactate dehydrogenase (LDH)    Standing  Status:   Future    Standing Expiration Date:   07/26/2018  . Uric acid    Standing Status:   Future    Standing Expiration Date:   07/26/2018    Cancer Staging No matching staging information was found for the patient.  All questions were answered.  . The patient knows to call the clinic with any problems, questions or concerns.  This note was electronically signed.    History of Presenting Illness Kendra Ball is a 72 y.o. female followed in the [facility] for ongoing evaluation of axillary lymphadenopathy, referred by Kendra Ball.  The patient has initially presented to his office due intermittent voice hoarseness accompanying decreased appetite without significant weight change, fevers, chills, night sweats.    Patient indicates the clinic to review results of the PET/CT.  Since last visit in the clinic, patient has had no new symptoms.  Kendra Ball denies any shortness of breath, chest pain, or cough.  No nausea, early satiety, abdominal pain, diarrhea, or constipation.  No swelling in the armpits, neck, or groins.   Oncological/hematological History: --[place] Head/Neck, 02/18/17: Right hypoechoic supraclavicular adenopathy corresponding to palpable region, with at least 3 nodes, largest 1.3 x 1.1x1.4 cm; these were not evident on the prior CT. Two adjacent hypoechoic right occipital lymph nodes without normal fatty hilum, 0.4 and 0.6 cm short axis diameter, corresponding to palpable region. --Rt Supraclav LN Excisional Bx, 02/24/17: ATYPICAL LYMPHOID PROLIFERATION, NECROTIC SOFT TISSUE WITH ASSOCIATED ACUTE AND CHRONIC INFLAMMATION; The excisional biopsy is composed of necrotic soft tissue with associated inflammatory reaction. There are three adjacent lymph nodes which are slightly enlarged in size with enlarged lymphocytes. Immunohistochemistry was performed to exclude lymphoma. There is an admixture of B and T-cells with CD4-positive T-cells predominating. Bcl-6 has an unusual  staining pattern extending beyond the dendritic meshworks, which may represent the increased CD4 positive T-cells. The proliferative rate is low overall. By flow cytometry (see [phone]), there is no monoclonal B-cell or phenotypically aberrant T-cell population identified. Special stains for microorganisms (AFB, GMS, PAS and Warthin-Starry) are negative.  --CT Chest, 05/30/17: Multiple enlarged right axillary lymph nodes. Largest right axillary lymph node measures 2.6 x 2.2 cm. Single enlarged left axillary lymph node measures 16 mm in short axis. These enlarged lymph nodes are new since the prior CTs. There are no enlarged neck base lymph nodes. No mediastinal or hilar masses.  No mediastinal or hilar adenopathy. Trachea and esophagus are unremarkable. Stable benign calcified nodule in the left lower lobe superior segment adjacent to the oblique fissure. No evidence of pneumonia or pulmonary edema. No lung masses or suspicious nodules. Stable appearance from the prior exams.  --CT Neck, 06/03/17: No cervical lymphadenopathy.  Bilateral axillary lymphadenopathy noted, but not fully assessed due to limitations of the study. --Labs, 06/06/17: LDH 186, beta-2 microglobulin 2.1; CRP 1.1, ESR 19, ANA & RF -- negative, C3 134, C4 36; WBC 7.5, Hgb 15.0, Plt 255;  --Rt Axillary LN Excisional Bx, 06/22/17: FIBROADIPOSE TISSUE WITH NECROSIS,  CHRONIC INFLAMMATION AND GRANULATION TISSUE. There is fibroadipose tissue with a nodular area of extensive necrosis with patchy, admixed partially necrotic cells most of which appear lymphoid. At the periphery there is inflamed granulation tissue and fibrosis. While tumor necrosis cannot be ruled out, the findings are not diagnostic of malignancy in this necrotic nodule. --PET-CT, 92/11/94: Hypermetabolic lymphadenopathy including right axilla with SUV max of 11.4, right supraclavicular region, left axilla with SUV max of 8.4, and right upper quadrant omental nodule with SUV  max of 5.4.   No history exists.    Medical History: Past Medical History:  Diagnosis Date  . Adenopathy    right supraclavicular  . Anxiety   . Arthritis   . COPD (chronic obstructive pulmonary disease) ([place])   . DEPRESSION 08/26/2008  . Hx of colonic polyps    First noted on  colonoscopy 2012  . Hypercholesteremia   . HYPERTENSION 08/26/2008  . HYPOTHYROIDISM 08/26/2008  . Obesity   . Osteoporosis   . Substance abuse [facility])    inpatient and outpatient tx for substance abuse  . TOBACCO USER 12/25/2009    Surgical History: Past Surgical History:  Procedure Laterality Date  . AXILLARY LYMPH NODE BIOPSY Right 06/22/2017   Procedure: RIGHT AXILLARY LYMPH NODE BIOPSY;  Surgeon: [name], MD;  Location: [facility];  Service: General;  Laterality: Right;  . [place] SURGERY  2006  . CESAREAN SECTION    . COLON RESECTION N/A 05/09/2014   Procedure: LAPAROSCOPIC HAND-ASSISTED EXTENDED RIGHT COLECTOMY, LAPAROSCOPIC LYSIS OF ADHESIONS, SPLENIC FLEXURE MOBILIZATION.;  Surgeon: [name], MD;  Location: [facility];  Service: General;  Laterality: N/A;  . DILATION AND CURETTAGE OF UTERUS     x2  . FOREARM FRACTURE SURGERY     right  . GASTRIC BYPASS  2006  . MASS EXCISION Right 02/24/2017   Procedure: EXCISIONAL BIOPSY RIGHT SUPRA-CLAVICULAR NODE;  Surgeon: [name], MD;  Location: [place];  Service: ENT;  Laterality: Right;  . TONSILLECTOMY      Family History: Family History  Problem Relation Age of Onset  . Cancer Mother        colon  . Dementia Mother   . Depression Mother   . Heart disease Father 61  . Heart disease Sister 57       stents, pacemaker  . Depression Sister   . Heart disease Brother 71       CABG, MI  . Depression Brother   . Obesity Daughter   . Suicidality Daughter   . Bipolar disorder Daughter   . Obesity Son   . Mental illness Daughter        bipolar  . Obesity Daughter   . Depression Sister   . Dementia Paternal Aunt   .  Alcohol abuse Maternal Uncle   . Colon cancer Maternal Aunt   . Stomach cancer Maternal Grandfather     Social History: Social History   Socioeconomic History  . Marital status: Married    Spouse name: Not on file  . Number of children: 3  . Years of education: Not on file  . Highest education level: Not on file  Occupational History  . Occupation: RETIRED    Employer: [employer]  Social Needs  . Financial resource strain: Not on file  . Food insecurity:    Worry: Not on file    Inability: Not on file  . Transportation needs:    Medical: Not on file    Non-medical: Not  on file  Tobacco Use  . Smoking status: Current Every Day Smoker    Packs/day: 1.00    Years: 50.00    Pack years: 50.00    Types: Cigarettes  . Smokeless tobacco: Never Used  Substance and Sexual Activity  . Alcohol use: No    Comment: quit on Aug 27, 2015  . Drug use: No    Frequency: 20.0 times per week    Comment: last used May 2017  . Sexual activity: Not Currently    Partners: Male    Birth control/protection: Post-menopausal  Lifestyle  . Physical activity:    Days per week: Not on file    Minutes per session: Not on file  . Stress: Not on file  Relationships  . Social connections:    Talks on phone: Not on file    Gets together: Not on file    Attends religious service: Not on file    Active member of club or organization: Not on file    Attends meetings of clubs or organizations: Not on file    Relationship status: Not on file  . Intimate partner violence:    Fear of current or ex partner: Not on file    Emotionally abused: Not on file    Physically abused: Not on file    Forced sexual activity: Not on file  Other Topics Concern  . Not on file  Social History Narrative   Ms. Lindo lives in [place] with her husband. She has 3 grown children and several grand children. She has joint custody of 3 of her grand children as her daughter has bipolar disorder and has needed  assistance with children in past.    Allergies: Allergies  Allergen Reactions  . Morphine Sulfate Swelling and Other (See Comments)    Swelling around injection area    Medications:  Current Outpatient Medications  Medication Sig Dispense Refill  . albuterol (PROVENTIL HFA;VENTOLIN HFA) 108 (90 Base) MCG/ACT inhaler Inhale 2 puffs into the lungs every 6 (six) hours as needed for wheezing or shortness of breath. 1 Inhaler 2  . aspirin 81 MG tablet Take 81 mg by mouth daily.    . budesonide-formoterol (SYMBICORT) 160-4.5 MCG/ACT inhaler Inhale 2 puffs into the lungs 2 (two) times daily. 1 Inhaler 6  . buPROPion (WELLBUTRIN SR) 150 MG 12 hr tablet Take 1 tablet (150 mg total) by mouth 2 (two) times daily. 180 tablet 0  . busPIRone (BUSPAR) 10 MG tablet Take 1 tablet (10 mg total) by mouth 3 (three) times daily. 270 tablet 0  . Cyanocobalamin (VITAMIN B-12 PO) Take 1 tablet by mouth daily.    . ferrous fumarate (HEMOCYTE - 106 MG FE) 325 (106 FE) MG TABS Take 1 tablet by mouth daily. Reported on 10/06/2015    . HYDROcodone-acetaminophen (NORCO/VICODIN) 5-325 MG tablet Take 1 tablet by mouth every 6 (six) hours as needed for moderate pain. 12 tablet 0  . levothyroxine (SYNTHROID, LEVOTHROID) 100 MCG tablet Take 1 tablet (100 mcg total) by mouth daily before breakfast. 90 tablet 3  . lisinopril (PRINIVIL,ZESTRIL) 10 MG tablet Take 1 tablet (10 mg total) by mouth daily. 90 tablet 1  . Magnesium 250 MG TABS Take 250 mg by mouth 2 (two) times daily.     . simvastatin (ZOCOR) 10 MG tablet Take 1 tablet (10 mg total) by mouth at bedtime. 90 tablet 3  . traZODone (DESYREL) 100 MG tablet Take 2 tablets (200 mg total) by mouth at bedtime. [place]  tablet 0  . venlafaxine XR (EFFEXOR-XR) 75 MG 24 hr capsule Take 1 capsule (75 mg total) by mouth daily with breakfast. 90 capsule 0   No current facility-administered medications for this visit.     Review of Systems: Review of Systems  Constitutional:  Positive for appetite change. Negative for chills, diaphoresis, fever and unexpected weight change.  HENT:   Positive for voice change.   All other systems reviewed and are negative.    PHYSICAL EXAMINATION Blood pressure (!) 120/50, pulse 71, temperature 98.3 F (36.8 C), temperature source Oral, resp. rate 18, height 5' 4" (1.626 m), weight 166 lb 9.6 oz (75.6 kg), SpO2 96 %.  ECOG PERFORMANCE STATUS: 2 - Symptomatic, <50% confined to bed  Physical Exam  Constitutional: She is oriented to person, place, and time and well-developed, well-nourished, and in no distress. No distress.  HENT:  Head: Normocephalic and atraumatic.  Mouth/Throat: Oropharynx is clear and moist. No oropharyngeal exudate.  Neck: No thyromegaly present.  Cardiovascular: Normal rate, regular rhythm and normal heart sounds.  No murmur heard. Pulmonary/Chest: Effort normal and breath sounds normal. No respiratory distress. She has no wheezes. She has no rales.  Abdominal: Soft. Bowel sounds are normal. She exhibits no distension and no mass. There is no tenderness. There is no guarding.  Musculoskeletal: She exhibits no edema.  Lymphadenopathy:    She has no cervical adenopathy.  Neurological: She is alert and oriented to person, place, and time. She has normal reflexes. No cranial nerve deficit.  Skin: Skin is warm and dry. No rash noted. She is not diaphoretic. No erythema. No pallor.     LABORATORY DATA: I have personally reviewed the data as listed: No visits with results within 1 Week(s) from this visit.  Latest known visit with results is:  Hospital Outpatient Visit on 07/19/2017  Component Date Value Ref Range Status  . Glucose-Capillary 07/19/2017 87  65 - 99 mg/dL Final       Ardath Sax, MD

## 2017-07-26 NOTE — Telephone Encounter (Signed)
Scheduled appt per 4/3 los - per patient request no print out wanted - patient is aware of appt date and time.

## 2017-07-31 NOTE — Assessment & Plan Note (Signed)
72 y.o. female with minimal systemic symptoms, but radiographic evidence of bilateral axillary lymphadenopathy, mediastinal, and right supraclavicular lymphadenopathy.  Patient has undergone previous biopsy of supraclavicular lymph node which was nondiagnostic.  Repeat biopsy obtained from the right axilla also shows nondiagnostic sample without definitive evidence of malignancy, but with significant amount of necrosis present.  Lab work obtained prior demonstrates no significant hematological abnormalities, no elevation of LDH, or beta-2 microglobulin.  The only abnormal value is really mild elevation of CRP.  ANA, rheumatoid factor and complement 3 and 4 components appear to be within normal range.  Differential diagnosis continues to include lymphoproliferative disorder such as Hodgkin's lymphoma, reactive lymphoid proliferation, possible viral or fungal infection and autoimmune condition.  PET/CT continues to demonstrate hypermetabolic lymphadenopathy in the right axilla, right supraclavicular region, left axilla, and a single omental nodule.  The most active process is in the right axilla which has been previously biopsied without definitive diagnosis.  No new targets for biopsy discovered at this time.  Plan: -Initiate monitoring. -Return to clinic in 3 months with CT of the neck/chest/abdomen/pelvis and lab work prior to the clinic visit.

## 2017-09-13 ENCOUNTER — Other Ambulatory Visit: Payer: Self-pay

## 2017-09-13 DIAGNOSIS — I10 Essential (primary) hypertension: Secondary | ICD-10-CM

## 2017-09-13 MED ORDER — LISINOPRIL 10 MG PO TABS: 10.0000 mg | ORAL_TABLET | Freq: Every day | ORAL | 0 refills | Status: DC

## 2017-09-13 NOTE — Telephone Encounter (Signed)
My Chart message sent to patient regarding refill and follow up appointment.

## 2017-09-20 ENCOUNTER — Ambulatory Visit (INDEPENDENT_AMBULATORY_CARE_PROVIDER_SITE_OTHER): Payer: Medicare Other | Admitting: Family Medicine

## 2017-09-20 ENCOUNTER — Encounter: Payer: Self-pay | Admitting: Family Medicine

## 2017-09-20 ENCOUNTER — Ambulatory Visit: Payer: Self-pay | Admitting: Family Medicine

## 2017-09-20 VITALS — BP 95/65 | HR 69 | Resp 16 | Ht 64.75 in | Wt 167.0 lb

## 2017-09-20 DIAGNOSIS — R591 Generalized enlarged lymph nodes: Secondary | ICD-10-CM

## 2017-09-20 DIAGNOSIS — E039 Hypothyroidism, unspecified: Secondary | ICD-10-CM | POA: Diagnosis not present

## 2017-09-20 DIAGNOSIS — Z0289 Encounter for other administrative examinations: Secondary | ICD-10-CM

## 2017-09-20 DIAGNOSIS — I10 Essential (primary) hypertension: Secondary | ICD-10-CM | POA: Diagnosis not present

## 2017-09-20 DIAGNOSIS — J449 Chronic obstructive pulmonary disease, unspecified: Secondary | ICD-10-CM

## 2017-09-20 DIAGNOSIS — E785 Hyperlipidemia, unspecified: Secondary | ICD-10-CM

## 2017-09-20 MED ORDER — SIMVASTATIN 10 MG PO TABS: 10.0000 mg | ORAL_TABLET | Freq: Every day | ORAL | 3 refills | Status: DC

## 2017-09-20 MED ORDER — BUDESONIDE-FORMOTEROL FUMARATE 160-4.5 MCG/ACT IN AERO: 2.0000 | INHALATION_SPRAY | Freq: Two times a day (BID) | RESPIRATORY_TRACT | 6 refills | Status: DC

## 2017-09-20 MED ORDER — ALBUTEROL SULFATE HFA 108 (90 BASE) MCG/ACT IN AERS: 2.0000 | INHALATION_SPRAY | Freq: Four times a day (QID) | RESPIRATORY_TRACT | 2 refills | Status: DC | PRN

## 2017-09-20 NOTE — Progress Notes (Signed)
Subjective:    Patient ID: Kendra Ball, female    DOB: 04/20/1946, 72 y.o.   MRN: [id number] Chief Complaint  Patient presents with  . Hypertension    follow up    Hypertension     Hypertension/hyperlipidemia: Patient reports appliance with lisinopril 10 mg daily, Zocor 10 mg daily. She takes a daily baby aspirin. Patient denies chest pain, shortness of breath, dizziness or lower extremity edema.   She has lost weight slowly, since she has stopped drinking. She is 1 year Sober.   Hypothyroidism: Patient reports compliance with levothyroxine 100 g daily on an empty stomach. She denies any constipation, change in diarrhea, flushing or palpitations.  COPD: Doing well, well controlled.  Using Symbicort as directed. Breathing well, no dyspnea. Albuterol use about once a month. H/o Pulm nodule- stable. Following yearly with low dose CT, last completed 10/13/2015. FINDINGS: Mediastinum/Nodes: Aortic and branch vessel atherosclerosis. Normal heart size, without pericardial effusion. Multivessel coronary artery atherosclerosis. No mediastinal or definite hilar adenopathy, given limitations of unenhanced CT. Lungs/Pleura: No pleural fluid. Moderate centrilobular emphysema. Calcified granulomas, including dominant superior segment left lower lobe calcified nodule. Tiny bilateral upper lobe pulmonary nodules are identified, including at volume derived equivalent diameter 3 millimeters in the right upper lobe on image 53/series 3. Mild centrilobular emphysema. IMPRESSION: 1. Lung-RADS Category 2, benign appearance or behavior. Continue annual screening with low-dose chest CT without contrast in 12 months. 2.  Atherosclerosis, including within the coronary arteries.   Past Medical History:  Diagnosis Date  . Adenopathy    right supraclavicular  . Anxiety   . Arthritis   . COPD (chronic obstructive pulmonary disease) ([place])   . DEPRESSION 08/26/2008  . Hx of colonic polyps    First noted on  colonoscopy 2012  . Hypercholesteremia   . HYPERTENSION 08/26/2008  . HYPOTHYROIDISM 08/26/2008  . Obesity   . Osteoporosis   . Substance abuse [facility])    inpatient and outpatient tx for substance abuse  . TOBACCO USER 12/25/2009   Allergies  Allergen Reactions  . Morphine Sulfate Swelling and Other (See Comments)    Swelling around injection area   Social History   Socioeconomic History  . Marital status: Married    Spouse name: Not on file  . Number of children: 3  . Years of education: Not on file  . Highest education level: Not on file  Occupational History  . Occupation: RETIRED    Employer: [employer]  Social Needs  . Financial resource strain: Not on file  . Food insecurity:    Worry: Not on file    Inability: Not on file  . Transportation needs:    Medical: Not on file    Non-medical: Not on file  Tobacco Use  . Smoking status: Current Every Day Smoker    Packs/day: 1.00    Years: 50.00    Pack years: 50.00    Types: Cigarettes  . Smokeless tobacco: Never Used  Substance and Sexual Activity  . Alcohol use: No    Comment: quit on Aug 27, 2015  . Drug use: No    Frequency: 20.0 times per week    Comment: last used May 2017  . Sexual activity: Not Currently    Partners: Male    Birth control/protection: Post-menopausal  Lifestyle  . Physical activity:    Days per week: Not on file    Minutes per session: Not on file  . Stress: Not on file  Relationships  .  Social connections:    Talks on phone: Not on file    Gets together: Not on file    Attends religious service: Not on file    Active member of club or organization: Not on file    Attends meetings of clubs or organizations: Not on file    Relationship status: Not on file  . Intimate partner violence:    Fear of current or ex partner: Not on file    Emotionally abused: Not on file    Physically abused: Not on file    Forced sexual activity: Not on file  Other Topics Concern  .  Not on file  Social History Narrative   Ms. Toothaker lives in [place] with her husband. She has 3 grown children and several grand children. She has joint custody of 3 of her grand children as her daughter has bipolar disorder and has needed assistance with children in past.     Review of Systems Negative, with the exception of above mentioned in HPI     Objective:   Physical Exam BP 95/65 (BP Location: Left Arm, Patient Position: Sitting, Cuff Size: Normal)   Pulse 69   Resp 16   Ht 5' 4.75" (1.645 m)   Wt 167 lb (75.8 kg)   SpO2 96%   BMI 28.01 kg/m  Gen: Afebrile. No acute distress.  HENT: AT. [place].  MMM.  Eyes:Pupils Equal Round Reactive to light, Extraocular movements intact,  Conjunctiva without redness, discharge or icterus. Neck/lymp/endocrine: Supple, no thyromegaly CV: RRR no murmur, no edema, +2/4 P posterior tibialis pulses Chest: CTAB, no wheeze or crackles Abd: Soft.  Overweight. NTND. BS present.  No masses palpated.  Skin: No rashes, purpura or petechiae.  Neuro:  Normal gait. PERLA. EOMi. Alert. Oriented x3  Psych: Normal affect, dress and demeanor. Normal speech. Normal thought content and judgment.      Assessment & Plan:  [name] is a 72 y.o. female present for follow up on chronic medical  Essential hypertension/hyperlipidemia/overweight -Blood pressure rather low today.  She has noticed dizziness upon standing.  -Decrease lisinopril to 5 mg daily.   -Refills on Zocor provided. - Continue low-salt diet, exercise. HAs lost weight since she has stopped drinking.  - lisinopril (PRINIVIL,ZESTRIL) 10 MG tablet; Take 1 tablet (10 mg total) by mouth daily.  Dispense: 90 tablet; Refill: 1 - Cholesterol screening will be completed at [facility] yearly, last completed December 06, 2016 -Follow-up in 1 week with provider appointment on lower dose of lisinopril, if blood pressures are normal on this dose will call in refills for lisinopril 5 mg daily and follow-up  in 6 months.    Hypothyroidism, unspecified hypothyroidism type - TSH normal 12/06/2016 - Levothyroxine 100 mcg total daily.  COPD/Pulm nodule:  -Stable.  Well.  Refills provided today.   -Continue Symbicort- albuterol rarely needed. Refills provided.  - CT repeat 05/30/2017 - no acute lung findings, axillary findings concerning for lymphoma and pt is being followed by onc - f/u 6 mos.    Lymphadenopathy -Had multiple scans and biopsies since first diagnosis here.  Still inconclusive but suspect possible lymphoma.  She is following closely with oncology.   Electronically Signed by: Howard Pouch, DO [facility] [place]

## 2017-09-20 NOTE — Patient Instructions (Addendum)
Decrease lisinopril to 1/2 tab = 5 mg a day. Return in 1 week for BP recheck.  I refilled your other meds, makes sure your thyroid medicine is 100 mcg total.    Please help [place] help you:  We are honored you have chosen [facility] for your [facility] home. Below you will find basic instructions that you may need to access in the future. Please help [place] help you by reading the instructions, which cover many of the frequent questions we experience.   Prescription refills and request:  -In order to allow more efficient response time, please call your pharmacy for all refills. They will forward the request electronically to [place]. This allows for the quickest possible response. Request left on a nurse line can take longer to refill, since these are checked as time allows between office patients and other phone calls.  - refill request can take up to 3-5 working days to complete.  - If request is sent electronically and request is appropiate, it is usually completed in 1-2 business days.  - all patients will need to be seen routinely for all chronic medical conditions requiring prescription medications (see follow-up below). If you are overdue for follow up on your condition, you will be asked to make an appointment and we will call in enough medication to cover you until your appointment (up to 30 days).  - all controlled substances will require a face to face visit to request/refill.  - if you desire your prescriptions to go through a new pharmacy, and have an active script at original pharmacy, you will need to call your pharmacy and have scripts transferred to new pharmacy. This is completed between the pharmacy locations and not by your provider.    Results: If any images or labs were ordered, it can take up to 1 week to get results depending on the test ordered and the lab/facility running and resulting the test. - Normal or stable results, which do not need further discussion, may be released  to your mychart immediately with attached note to you. A call may not be generated for normal results. Please make certain to sign up for mychart. If you have questions on how to activate your mychart you can call the front office.  - If your results need further discussion, our office will attempt to contact you via phone, and if unable to reach you after 2 attempts, we will release your abnormal result to your mychart with instructions.  - All results will be automatically released in mychart after 1 week.  - Your provider will provide you with explanation and instruction on all relevant material in your results. Please keep in mind, results and labs may appear confusing or abnormal to the untrained eye, but it does not mean they are actually abnormal for you personally. If you have any questions about your results that are not covered, or you desire more detailed explanation than what was provided, you should make an appointment with your provider to do so.   Our office handles many outgoing and incoming calls daily. If we have not contacted you within 1 week about your results, please check your mychart to see if there is a message first and if not, then contact our office.  In helping with this matter, you help decrease call volume, and therefore allow [place] to be able to respond to patients needs more efficiently.   Acute office visits (sick visit):  An acute visit is intended for  a new problem and are scheduled in shorter time slots to allow schedule openings for patients with new problems. This is the appropriate visit to discuss a new problem. Problems will not be addressed by phone call or Echart message. Appointment is needed if requesting treatment. In order to provide you with excellent quality medical care with proper time for you to explain your problem, have an exam and receive treatment with instructions, these appointments should be limited to one new problem per visit. If you experience a new  problem, in which you desire to be addressed, please make an acute office visit, we save openings on the schedule to accommodate you. Please do not save your new problem for any other type of visit, let us take care of it properly and quickly for you.   Follow up visits:  Depending on your condition(s) your provider will need to see you routinely in order to provide you with quality care and prescribe medication(s). Most chronic conditions (Example: hypertension, Diabetes, depression/anxiety... etc), require visits a couple times a year. Your provider will instruct you on proper follow up for your personal medical conditions and history. Please make certain to make follow up appointments for your condition as instructed. Failing to do so could result in lapse in your medication treatment/refills. If you request a refill, and are overdue to be seen on a condition, we will always provide you with a 30 day script (once) to allow you time to schedule.    Medicare wellness (well visit): - we have a wonderful Nurse Maudie Mercury), that will meet with you and provide you will yearly medicare wellness visits. These visits should occur yearly (can not be scheduled less than 1 calendar year apart) and cover preventive health, immunizations, advance directives and screenings you are entitled to yearly through your medicare benefits. Do not miss out on your entitled benefits, this is when medicare will pay for these benefits to be ordered for you.  These are strongly encouraged by your provider and is the appropriate type of visit to make certain you are up to date with all preventive health benefits. If you have not had your medicare wellness exam in the last 12 months, please make certain to schedule one by calling the office and schedule your medicare wellness with Maudie Mercury as soon as possible.   Yearly physical (well visit):  - Adults are recommended to be seen yearly for physicals. Check with your insurance and date of your  last physical, most insurances require one calendar year between physicals. Physicals include all preventive health topics, screenings, medical exam and labs that are appropriate for gender/age and history. You may have fasting labs needed at this visit. This is a well visit (not a sick visit), new problems should not be covered during this visit (see acute visit).  - Pediatric patients are seen more frequently when they are younger. Your provider will advise you on well child visit timing that is appropriate for your their age. - This is not a medicare wellness visit. Medicare wellness exams do not have an exam portion to the visit. Some medicare companies allow for a physical, some do not allow a yearly physical. If your medicare allows a yearly physical you can schedule the medicare wellness with our nurse Maudie Mercury and have your physical with your provider after, on the same day. Please check with insurance for your full benefits.   Late Policy/No Shows:  - all new patients should arrive 15-30 minutes earlier than appointment to  allow [place] time  to  obtain all personal demographics,  insurance information and for you to complete office paperwork. - All established patients should arrive 10-15 minutes earlier than appointment time to update all information and be checked in .  - In our best efforts to run on time, if you are late for your appointment you will be asked to either reschedule or if able, we will work you back into the schedule. There will be a wait time to work you back in the schedule,  depending on availability.  - If you are unable to make it to your appointment as scheduled, please call 24 hours ahead of time to allow [place] to fill the time slot with someone else who needs to be seen. If you do not cancel your appointment ahead of time, you may be charged a no show fee.

## 2017-09-22 ENCOUNTER — Encounter: Payer: Self-pay | Admitting: Family Medicine

## 2017-09-27 ENCOUNTER — Ambulatory Visit (INDEPENDENT_AMBULATORY_CARE_PROVIDER_SITE_OTHER): Payer: Medicare Other | Admitting: Family Medicine

## 2017-09-27 ENCOUNTER — Encounter: Payer: Self-pay | Admitting: Family Medicine

## 2017-09-27 DIAGNOSIS — I10 Essential (primary) hypertension: Secondary | ICD-10-CM | POA: Diagnosis not present

## 2017-09-27 MED ORDER — LISINOPRIL 5 MG PO TABS: 5.0000 mg | ORAL_TABLET | Freq: Every day | ORAL | 1 refills | Status: DC

## 2017-09-27 NOTE — Progress Notes (Signed)
Subjective:    Patient ID: Kendra Ball, female    DOB: [birth year], 72 y.o.   MRN: [id number] Chief Complaint  Patient presents with  . Hypertension    Hypertension/hyperlipidemia: Patient reports compliance with lisinopril 5 mg daily. Her BP was too low and she has had dizziness on the lisinopril 10 mg QD. Zocor 10 mg daily. She takes a daily baby aspirin. Patient denies chest pain, shortness of breath, dizziness or lower extremity edema.  She has lost weight slowly, since she has stopped drinking. She is 1 year Sober.   Past Medical History:  Diagnosis Date  . Adenopathy    right supraclavicular  . Anxiety   . Arthritis   . COPD (chronic obstructive pulmonary disease) ([place])   . DEPRESSION 08/26/2008  . Hx of colonic polyps    First noted on  colonoscopy 2012  . Hypercholesteremia   . HYPERTENSION 08/26/2008  . HYPOTHYROIDISM 08/26/2008  . Obesity   . Osteoporosis   . Substance abuse [facility])    inpatient and outpatient tx for substance abuse  . TOBACCO USER 12/25/2009   Allergies  Allergen Reactions  . Morphine Sulfate Swelling and Other (See Comments)    Swelling around injection area   Social History   Socioeconomic History  . Marital status: Married    Spouse name: Not on file  . Number of children: 3  . Years of education: Not on file  . Highest education level: Not on file  Occupational History  . Occupation: RETIRED    Employer: [employer]  Social Needs  . Financial resource strain: Not on file  . Food insecurity:    Worry: Not on file    Inability: Not on file  . Transportation needs:    Medical: Not on file    Non-medical: Not on file  Tobacco Use  . Smoking status: Current Every Day Smoker    Packs/day: 1.00    Years: 50.00    Pack years: 50.00    Types: Cigarettes  . Smokeless tobacco: Never Used  Substance and Sexual Activity  . Alcohol use: No    Comment: quit on Aug 27, 2015  . Drug use: No    Frequency: 20.0 times per week   Comment: last used May 2017  . Sexual activity: Not Currently    Partners: Male    Birth control/protection: Post-menopausal  Lifestyle  . Physical activity:    Days per week: Not on file    Minutes per session: Not on file  . Stress: Not on file  Relationships  . Social connections:    Talks on phone: Not on file    Gets together: Not on file    Attends religious service: Not on file    Active member of club or organization: Not on file    Attends meetings of clubs or organizations: Not on file    Relationship status: Not on file  . Intimate partner violence:    Fear of current or ex partner: Not on file    Emotionally abused: Not on file    Physically abused: Not on file    Forced sexual activity: Not on file  Other Topics Concern  . Not on file  Social History Narrative   Ms. Gutmann lives in [place] with her husband. She has 3 grown children and several grand children. She has joint custody of 3 of her grand children as her daughter has bipolar disorder and has needed assistance with children in  past.   Review of Systems Negative, with the exception of above mentioned in HPI     Objective:   Physical Exam BP 117/73 (BP Location: Right Arm, Patient Position: Sitting, Cuff Size: Normal)   Pulse 63   Temp 98 F (36.7 C)   Resp 20   Ht 5\' 5"  (1.651 m)   Wt 165 lb 8 oz (75.1 kg)   SpO2 96%   BMI 27.54 kg/m  Gen: Afebrile. No acute distress. Nontoxic in presentation. Pleasant caucasain female.  HENT: AT. [place].  MMM.  Eyes:Pupils Equal Round Reactive to light, Extraocular movements intact,  Conjunctiva without redness, discharge or icterus. CV: RRR, no edema, +2/4 P posterior tibialis pulses Chest: CTAB, no wheeze or crackles Skin: no rashes, purpura or petechiae.  Neuro:  Normal gait. PERLA. EOMi. Alert. Oriented x3     Assessment & Plan:  Kendra Ball is a 72 y.o. female present for follow up low BP and dizziness  Essential  hypertension/hyperlipidemia/overweight - BP perfect at lower dose and dizziness has resolved.  - Continue lisinopril 5 mg QD. Refills provided today.  - Continue Zocor. - Continue low-salt diet, exercise. Has lost weight since she has stopped drinking.  - Cholesterol screening will be completed at CPE yearly, last completed December 06, 2016 F/U in 6 mos or CPE (whichever is first)   Psychologist, occupational by: Howard Pouch, DO [facility]

## 2017-09-27 NOTE — Patient Instructions (Signed)
BP looks great on lisinopril 5 mg  Refilled your meds.

## 2017-09-30 ENCOUNTER — Other Ambulatory Visit (HOSPITAL_COMMUNITY): Payer: Self-pay | Admitting: Psychiatry

## 2017-09-30 DIAGNOSIS — F99 Mental disorder, not otherwise specified: Secondary | ICD-10-CM

## 2017-09-30 DIAGNOSIS — F5105 Insomnia due to other mental disorder: Secondary | ICD-10-CM

## 2017-09-30 DIAGNOSIS — F331 Major depressive disorder, recurrent, moderate: Secondary | ICD-10-CM

## 2017-09-30 DIAGNOSIS — F1020 Alcohol dependence, uncomplicated: Secondary | ICD-10-CM

## 2017-10-06 ENCOUNTER — Telehealth: Payer: Self-pay | Admitting: Hematology and Oncology

## 2017-10-06 NOTE — Telephone Encounter (Signed)
Spoke to patient regarding upcoming July appointment updates.

## 2017-10-07 ENCOUNTER — Other Ambulatory Visit (HOSPITAL_COMMUNITY): Payer: Self-pay | Admitting: Psychiatry

## 2017-10-07 DIAGNOSIS — F331 Major depressive disorder, recurrent, moderate: Secondary | ICD-10-CM

## 2017-10-07 DIAGNOSIS — F411 Generalized anxiety disorder: Secondary | ICD-10-CM

## 2017-10-07 DIAGNOSIS — F1020 Alcohol dependence, uncomplicated: Secondary | ICD-10-CM

## 2017-10-11 ENCOUNTER — Other Ambulatory Visit (HOSPITAL_COMMUNITY): Payer: Self-pay

## 2017-10-11 DIAGNOSIS — F411 Generalized anxiety disorder: Secondary | ICD-10-CM

## 2017-10-11 DIAGNOSIS — F1020 Alcohol dependence, uncomplicated: Secondary | ICD-10-CM

## 2017-10-11 DIAGNOSIS — F331 Major depressive disorder, recurrent, moderate: Secondary | ICD-10-CM

## 2017-10-11 MED ORDER — BUSPIRONE HCL 10 MG PO TABS: 10.0000 mg | ORAL_TABLET | Freq: Three times a day (TID) | ORAL | 0 refills | Status: DC

## 2017-10-20 ENCOUNTER — Other Ambulatory Visit (HOSPITAL_COMMUNITY): Payer: Self-pay | Admitting: Psychiatry

## 2017-10-20 ENCOUNTER — Other Ambulatory Visit (HOSPITAL_COMMUNITY): Payer: Self-pay

## 2017-10-20 DIAGNOSIS — F5105 Insomnia due to other mental disorder: Secondary | ICD-10-CM

## 2017-10-20 DIAGNOSIS — F1020 Alcohol dependence, uncomplicated: Secondary | ICD-10-CM

## 2017-10-20 DIAGNOSIS — F99 Mental disorder, not otherwise specified: Secondary | ICD-10-CM

## 2017-10-20 DIAGNOSIS — F331 Major depressive disorder, recurrent, moderate: Secondary | ICD-10-CM

## 2017-10-20 MED ORDER — TRAZODONE HCL 100 MG PO TABS: 200.0000 mg | ORAL_TABLET | Freq: Every day | ORAL | 0 refills | Status: DC

## 2017-10-25 ENCOUNTER — Inpatient Hospital Stay: Payer: Medicare Other | Attending: Hematology and Oncology

## 2017-10-25 ENCOUNTER — Encounter (HOSPITAL_COMMUNITY): Payer: Self-pay

## 2017-10-25 ENCOUNTER — Ambulatory Visit (HOSPITAL_COMMUNITY)
Admission: RE | Admit: 2017-10-25 | Discharge: 2017-10-25 | Disposition: A | Discharge status: Home / Self Care | Payer: Medicare Other | Source: Ambulatory Visit | Attending: Hematology and Oncology | Admitting: Hematology and Oncology

## 2017-10-25 DIAGNOSIS — I700 Atherosclerosis of aorta: Secondary | ICD-10-CM | POA: Diagnosis not present

## 2017-10-25 DIAGNOSIS — R591 Generalized enlarged lymph nodes: Secondary | ICD-10-CM | POA: Insufficient documentation

## 2017-10-25 DIAGNOSIS — E7800 Pure hypercholesterolemia, unspecified: Secondary | ICD-10-CM | POA: Insufficient documentation

## 2017-10-25 DIAGNOSIS — Z800 Family history of malignant neoplasm of digestive organs: Secondary | ICD-10-CM | POA: Diagnosis not present

## 2017-10-25 DIAGNOSIS — E039 Hypothyroidism, unspecified: Secondary | ICD-10-CM | POA: Insufficient documentation

## 2017-10-25 DIAGNOSIS — Z808 Family history of malignant neoplasm of other organs or systems: Secondary | ICD-10-CM | POA: Diagnosis not present

## 2017-10-25 DIAGNOSIS — F17210 Nicotine dependence, cigarettes, uncomplicated: Secondary | ICD-10-CM | POA: Diagnosis not present

## 2017-10-25 DIAGNOSIS — I10 Essential (primary) hypertension: Secondary | ICD-10-CM | POA: Diagnosis not present

## 2017-10-25 LAB — CBC WITH DIFFERENTIAL (CANCER CENTER ONLY)
Basophils Absolute: 0 10*3/uL (ref 0.0–0.1)
Basophils Relative: 0 %
Eosinophils Absolute: 0.1 10*3/uL (ref 0.0–0.5)
Eosinophils Relative: 1 %
HCT: 43.2 % (ref 34.8–46.6)
Hemoglobin: 14 g/dL (ref 11.6–15.9)
Lymphocytes Relative: 16 %
Lymphs Abs: 1.1 10*3/uL (ref 0.9–3.3)
MCH: 30.8 pg (ref 25.1–34.0)
MCHC: 32.4 g/dL (ref 31.5–36.0)
MCV: 95.2 fL (ref 79.5–101.0)
Monocytes Absolute: 0.4 10*3/uL (ref 0.1–0.9)
Monocytes Relative: 6 %
Neutro Abs: 5.2 10*3/uL (ref 1.5–6.5)
Neutrophils Relative %: 77 %
Platelet Count: 400 10*3/uL (ref 145–400)
RBC: 4.54 MIL/uL (ref 3.70–5.45)
RDW: 13.9 % (ref 11.2–14.5)
WBC Count: 6.8 10*3/uL (ref 3.9–10.3)

## 2017-10-25 LAB — CMP (CANCER CENTER ONLY)
ALT: 16 U/L (ref 0–44)
AST: 14 U/L — ABNORMAL LOW (ref 15–41)
Albumin: 3.6 g/dL (ref 3.5–5.0)
Alkaline Phosphatase: 85 U/L (ref 38–126)
Anion gap: 8 (ref 5–15)
BUN: 9 mg/dL (ref 8–23)
CO2: 34 mmol/L — ABNORMAL HIGH (ref 22–32)
Calcium: 10.2 mg/dL (ref 8.9–10.3)
Chloride: 102 mmol/L (ref 98–111)
Creatinine: 0.78 mg/dL (ref 0.44–1.00)
GFR, Est AFR Am: >60 mL/min (ref >60)
GFR, Estimated: >60 mL/min (ref >60)
Glucose, Bld: 85 mg/dL (ref 70–99)
Potassium: 4.9 mmol/L (ref 3.5–5.1)
Sodium: 144 mmol/L (ref 135–145)
Total Bilirubin: 0.3 mg/dL (ref 0.3–1.2)
Total Protein: 7 g/dL (ref 6.5–8.1)

## 2017-10-25 LAB — URIC ACID: Uric Acid, Serum: 5 mg/dL (ref 2.5–7.1)

## 2017-10-25 LAB — LACTATE DEHYDROGENASE: LDH: 196 U/L — ABNORMAL HIGH (ref 98–192)

## 2017-10-25 MED ORDER — IOPAMIDOL (ISOVUE-300) INJECTION 61%
INTRAVENOUS | Status: AC
  Filled 2017-10-25: qty 100

## 2017-10-25 MED ORDER — IOPAMIDOL (ISOVUE-300) INJECTION 61%
100.0000 mL | Freq: Once | INTRAVENOUS | Status: AC | PRN
  Administered 2017-10-25: 100 mL via INTRAVENOUS

## 2017-10-31 ENCOUNTER — Telehealth: Payer: Self-pay | Admitting: Oncology

## 2017-10-31 ENCOUNTER — Encounter: Payer: Self-pay | Admitting: Nurse Practitioner

## 2017-10-31 ENCOUNTER — Inpatient Hospital Stay (HOSPITAL_BASED_OUTPATIENT_CLINIC_OR_DEPARTMENT_OTHER): Payer: Medicare Other | Admitting: Nurse Practitioner

## 2017-10-31 VITALS — BP 133/59 | HR 65 | Temp 98.9°F | Resp 18 | Ht 65.0 in | Wt 168.1 lb

## 2017-10-31 DIAGNOSIS — Z800 Family history of malignant neoplasm of digestive organs: Secondary | ICD-10-CM

## 2017-10-31 DIAGNOSIS — I10 Essential (primary) hypertension: Secondary | ICD-10-CM | POA: Diagnosis not present

## 2017-10-31 DIAGNOSIS — R591 Generalized enlarged lymph nodes: Secondary | ICD-10-CM | POA: Diagnosis not present

## 2017-10-31 DIAGNOSIS — E7800 Pure hypercholesterolemia, unspecified: Secondary | ICD-10-CM

## 2017-10-31 DIAGNOSIS — E039 Hypothyroidism, unspecified: Secondary | ICD-10-CM

## 2017-10-31 NOTE — Telephone Encounter (Signed)
Appointments scheduled AVS/Calendar printed per 7/9 los °

## 2017-10-31 NOTE — Telephone Encounter (Signed)
Referral added to [facility] and IB message to HIM to send records per 7/9 los

## 2017-10-31 NOTE — Progress Notes (Addendum)
[facility] OFFICE PROGRESS NOTE   Diagnosis: Lymphadenopathy  INTERVAL HISTORY:   Kendra Ball is a 72 year old woman previously followed by Dr. Lebron Conners for lymphadenopathy of unclear etiology.   She had recent follow-up CT scans and is here to review those results.  She denies fever/sweats.  She reports a good appetite.  No weight loss.  She thinks a lymph node at the right low neck is larger.  She reports intermittent hoarseness over the past 1 year.  Episodes of hoarseness are becoming more frequent.  She reports past medical history significant for hypertension, hypothyroidism and hypercholesterolemia.  She is allergic to morphine.  Family history significant for mother with colon cancer, maternal grandfather and maternal aunt with stomach cancer and maternal aunt with brain cancer.  She smokes 1 pack/day.  She estimates smoking for about 53 years.  She quit drinking alcohol 2 years ago.  She lives in [place].  She is married.  She has 3 children.  Objective:  Vital signs in last 24 hours:  Blood pressure (!) 133/59, pulse 65, temperature 98.9 F (37.2 C), temperature source Oral, resp. rate 18, height 5\' 5"  (1.651 m), weight 168 lb 1.6 oz (76.2 kg), SpO2 97 %.    HEENT: No thrush or ulcers. Lymphatics: 1 cm high right cervical lymph node; 1 cm right low neck lymph node; 2 cm right scalene lymph node; subcentimeter right supraclavicular lymph node; 1-1.5 cm medial left axillary lymph node. Resp: Lungs clear bilaterally. Cardio: Regular rate and rhythm. GI: Abdomen soft and nontender.  No hepatomegaly.  No splenomegaly. Vascular: No leg edema. Neuro: Alert and oriented. Skin: No rash.   Lab Results:  Lab Results  Component Value Date   WBC 6.8 10/25/2017   HGB 14.0 10/25/2017   HCT 43.2 10/25/2017   MCV 95.2 10/25/2017   PLT 400 10/25/2017   NEUTROABS 5.2 10/25/2017    Imaging:  No results found.  Medications: I have reviewed the patient's  current medications.  Assessment/Plan: 1. Lymphadenopathy  Ultrasound of the neck 02/18/2017- right supraclavicular adenopathy with at least 3 nodes, largest measuring 1.3 x 1.1 x 1.4 cm; 2 adjacent hypoechoic right occipital lymph nodes  Excisional biopsy right supraclavicular lymphadenopathy 72/09/2033 (Dr. Deetta Perla lymphoid proliferation.  Necrotic soft tissue with associated acute and chronic inflammation.  By flow cytometry no monoclonal B-cell or phenotypically aberrant T-cell population.  Special stains for microorganisms including AFB, GMS, PAS and Warthin-Starry, are negative.  CT chest 05/30/2017- multiple enlarged right axillary lymph nodes.  Largest right axillary lymph node measures 2.6 x 2.2 cm.  Single enlarged left axillary lymph node measuring 16 mm.  CT neck 06/03/2017- known bilateral axillary lymphadenopathy partially covered on the scan.  No lymphadenopathy seen in the neck.  Biopsy right axillary lymph node 06/22/2017 (Dr. Georgette Dover)- fibroadipose tissue with necrosis, chronic inflammation and granulation tissue  PET scan 5/97/4163- hypermetabolic bilateral axillary lymph nodes.  Hypermetabolic but small right supraclavicular lymph nodes.  In the right omentum there is a small focus of rim density and internal fat density which is hypermetabolic.  CT neck 10/25/2017- progressed bilateral subclavian lymphadenopathy since February, right greater than left.  Conspicuous enlargement of the small lymph nodes at the right level 2 and 3 nodal stations.  CT chest/abdomen/pelvis 10/25/2017- progressive adenopathy identified within the left axilla, bilateral supraclavicular stations and left inguinal region.  Persistent but improved right axillary adenopathy.  Scattered indeterminate low-attenuation foci within the spleen not seen on study from 05/30/2017. 2. Hypertension 3. Hypothyroid  4. Hypercholesterolemia 5. Family history significant for colon cancer, stomach cancer and brain  cancer 6. Ongoing tobacco use  Disposition: Kendra Ball is a 72 year old woman with lymphadenopathy of unknown etiology.  Dr. Benay Spice reviewed the differential diagnosis with [name] and her husband to include lymphoma, other malignancy, infection, autoimmune disorder.  In addition to the lymphadenopathy she has hoarseness of unknown etiology.  We are referring her back to Dr. Erik Obey for a head/neck exam.  She understands the recommendation may be to proceed with another excisional lymph node biopsy pending Dr. Noreene Filbert evaluation.  She will return for a follow-up visit in 3 weeks.  Patient seen with Dr. Benay Spice.  CT images reviewed on the computer with [name] and her husband.  45 minutes were spent face-to-face at today's visit with the majority of that time involved in counseling/coordination of care.    [name] ANP/GNP-BC   10/31/2017  11:23 AM  This was a shared visit with [name].  Ms. Bacchi was interviewed and examined.  We reviewed the restaging CT images with her.  She has extensive lymphadenopathy.  Multiple firm lymph nodes were palpated on exam today.  An extensive diagnostic work-up has been negative to date.  I am concerned that the lymphadenopathy is related to an underlying malignancy.  We will refer her to Dr. Erik Obey to consider a repeat head and neck evaluation given the firm neck lymphadenopathy and hoarseness.  If this evaluation is negative we will proceed with a repeat biopsy.  Julieanne Manson, MD

## 2017-11-01 ENCOUNTER — Ambulatory Visit: Payer: Self-pay | Admitting: Hematology and Oncology

## 2017-11-01 ENCOUNTER — Other Ambulatory Visit: Payer: Self-pay

## 2017-11-09 ENCOUNTER — Ambulatory Visit (INDEPENDENT_AMBULATORY_CARE_PROVIDER_SITE_OTHER): Payer: Medicare Other | Admitting: Psychiatry

## 2017-11-09 ENCOUNTER — Encounter (HOSPITAL_COMMUNITY): Payer: Self-pay | Admitting: Psychiatry

## 2017-11-09 DIAGNOSIS — F411 Generalized anxiety disorder: Secondary | ICD-10-CM

## 2017-11-09 DIAGNOSIS — F331 Major depressive disorder, recurrent, moderate: Secondary | ICD-10-CM | POA: Diagnosis not present

## 2017-11-09 DIAGNOSIS — F99 Mental disorder, not otherwise specified: Secondary | ICD-10-CM

## 2017-11-09 DIAGNOSIS — Z810 Family history of intellectual disabilities: Secondary | ICD-10-CM

## 2017-11-09 DIAGNOSIS — F5105 Insomnia due to other mental disorder: Secondary | ICD-10-CM

## 2017-11-09 DIAGNOSIS — F1020 Alcohol dependence, uncomplicated: Secondary | ICD-10-CM | POA: Diagnosis not present

## 2017-11-09 DIAGNOSIS — Z811 Family history of alcohol abuse and dependence: Secondary | ICD-10-CM

## 2017-11-09 DIAGNOSIS — T65224A Toxic effect of tobacco cigarettes, undetermined, initial encounter: Secondary | ICD-10-CM

## 2017-11-09 DIAGNOSIS — Z818 Family history of other mental and behavioral disorders: Secondary | ICD-10-CM

## 2017-11-09 MED ORDER — BUSPIRONE HCL 10 MG PO TABS: 10.0000 mg | ORAL_TABLET | Freq: Three times a day (TID) | ORAL | 1 refills | Status: DC

## 2017-11-09 MED ORDER — BUPROPION HCL ER (SR) 150 MG PO TB12: 150.0000 mg | ORAL_TABLET | Freq: Two times a day (BID) | ORAL | 1 refills | Status: DC

## 2017-11-09 MED ORDER — TRAZODONE HCL 100 MG PO TABS: 200.0000 mg | ORAL_TABLET | Freq: Every day | ORAL | 1 refills | Status: DC

## 2017-11-09 MED ORDER — VENLAFAXINE HCL ER 75 MG PO CP24: 75.0000 mg | ORAL_CAPSULE | Freq: Every day | ORAL | 1 refills | Status: DC

## 2017-11-09 NOTE — Progress Notes (Signed)
[place] MD/PA/NP OP Progress Note  11/09/2017 10:21 AM Kendra Ball  MRN:  [id number]  Chief Complaint:  Chief Complaint    Follow-up     HPI: "I'm awesome. I guess I am really good. I am really grateful". Pt feels her substance abuse treatment saved her life. Pt using her coping skills to deal with bad days. She is able to normalize her stressors and not feel isolated. Pt has not had any alcohol in 2 yrs. Pt meets with her AA sponsor frequently and they meet once a week.  Pt denies depression. Pt denies any issues with lack of motivation and anhedonia. Pt has periods of poor energy due to her health (possible lymphoma- she is working with her doctors). She is eating well and sleep is good with Trazodone. Pt denies SI/HI.  Her anxiety comes on/off. It happens randomly and without any stressors. She works thru it and meds help. The symptoms do not last long.  Pt states-taking meds as prescribed and denies SE.   Visit Diagnosis:    ICD-10-CM   1. Toxic effect of tobacco cigarette, undetermined intent, initial encounter T65.224A buPROPion (WELLBUTRIN SR) 150 MG 12 hr tablet  2. Alcohol use disorder, moderate, dependence (HCC) F10.20 busPIRone (BUSPAR) 10 MG tablet    traZODone (DESYREL) 100 MG tablet  3. MDD (major depressive disorder), recurrent episode, moderate (HCC) F33.1 busPIRone (BUSPAR) 10 MG tablet    traZODone (DESYREL) 100 MG tablet    venlafaxine XR (EFFEXOR-XR) 75 MG 24 hr capsule  4. GAD (generalized anxiety disorder) F41.1 busPIRone (BUSPAR) 10 MG tablet    venlafaxine XR (EFFEXOR-XR) 75 MG 24 hr capsule  5. Insomnia due to other mental disorder F51.05 traZODone (DESYREL) 100 MG tablet   F99       Past Psychiatric History:  Anxiety: Yes Bipolar Disorder: No Depression: Yes Mania: No Psychosis: No Schizophrenia: No Personality Disorder: No Hospitalization for psychiatric illness: No History of Electroconvulsive Shock Therapy: No Prior Suicide Attempts:  No   Past Medical History:  Past Medical History:  Diagnosis Date  . Adenopathy    right supraclavicular  . Anxiety   . Arthritis   . COPD (chronic obstructive pulmonary disease) ([place])   . DEPRESSION 08/26/2008  . Hx of colonic polyps    First noted on  colonoscopy 2012  . Hypercholesteremia   . HYPERTENSION 08/26/2008  . HYPOTHYROIDISM 08/26/2008  . Obesity   . Osteoporosis   . Substance abuse [facility])    inpatient and outpatient tx for substance abuse  . TOBACCO USER 12/25/2009    Past Surgical History:  Procedure Laterality Date  . AXILLARY LYMPH NODE BIOPSY Right 06/22/2017   Procedure: RIGHT AXILLARY LYMPH NODE BIOPSY;  Surgeon: [name], MD;  Location: [facility];  Service: General;  Laterality: Right;  . [place] SURGERY  2006  . CESAREAN SECTION    . COLON RESECTION N/A 05/09/2014   Procedure: LAPAROSCOPIC HAND-ASSISTED EXTENDED RIGHT COLECTOMY, LAPAROSCOPIC LYSIS OF ADHESIONS, SPLENIC FLEXURE MOBILIZATION.;  Surgeon: [name], MD;  Location: [facility];  Service: General;  Laterality: N/A;  . DILATION AND CURETTAGE OF UTERUS     x2  . FOREARM FRACTURE SURGERY     right  . GASTRIC BYPASS  2006  . MASS EXCISION Right 02/24/2017   Procedure: EXCISIONAL BIOPSY RIGHT SUPRA-CLAVICULAR NODE;  Surgeon: [name], MD;  Location: [place];  Service: ENT;  Laterality: Right;  . TONSILLECTOMY      Family Psychiatric History: Family History  Problem Relation Age of Onset  . Cancer Mother        colon  . Dementia Mother   . Depression Mother   . Heart disease Father 59  . Heart disease Sister 51       stents, pacemaker  . Depression Sister   . Heart disease Brother 67       CABG, MI  . Depression Brother   . Obesity Daughter   . Suicidality Daughter   . Bipolar disorder Daughter   . Obesity Son   . Mental illness Daughter        bipolar  . Obesity Daughter   . Depression Sister   . Dementia Paternal Aunt   . Alcohol abuse Maternal Uncle   . Colon  cancer Maternal Aunt   . Stomach cancer Maternal Grandfather     Social History:  Social History   Socioeconomic History  . Marital status: Married    Spouse name: Not on file  . Number of children: 3  . Years of education: Not on file  . Highest education level: Not on file  Occupational History  . Occupation: RETIRED    Employer: [employer]  Social Needs  . Financial resource strain: Not on file  . Food insecurity:    Worry: Not on file    Inability: Not on file  . Transportation needs:    Medical: Not on file    Non-medical: Not on file  Tobacco Use  . Smoking status: Current Every Day Smoker    Packs/day: 1.00    Years: 50.00    Pack years: 50.00    Types: Cigarettes  . Smokeless tobacco: Never Used  Substance and Sexual Activity  . Alcohol use: No    Comment: quit on Aug 27, 2015  . Drug use: No    Frequency: 20.0 times per week    Comment: last used May 2017  . Sexual activity: Not Currently    Partners: Male    Birth control/protection: Post-menopausal  Lifestyle  . Physical activity:    Days per week: Not on file    Minutes per session: Not on file  . Stress: Not on file  Relationships  . Social connections:    Talks on phone: Not on file    Gets together: Not on file    Attends religious service: Not on file    Active member of club or organization: Not on file    Attends meetings of clubs or organizations: Not on file    Relationship status: Not on file  Other Topics Concern  . Not on file  Social History Narrative   Ms. Campus lives in [place] with her husband. She has 3 grown children and several grand children. She has joint custody of 3 of her grand children as her daughter has bipolar disorder and has needed assistance with children in past.    Allergies:  Allergies  Allergen Reactions  . Morphine Sulfate Swelling and Other (See Comments)    Swelling around injection area    Metabolic Disorder Labs: Lab Results  Component  Value Date   HGBA1C 5.2 12/06/2016   MPG 111 05/10/2014   No results found for: PROLACTIN Lab Results  Component Value Date   CHOL 164 12/06/2016   TRIG 95.0 12/06/2016   HDL 70.50 12/06/2016   CHOLHDL 2 12/06/2016   VLDL 19.0 12/06/2016   LDLCALC 74 12/06/2016   LDLCALC 82 10/21/2015   Lab Results  Component Value Date  TSH 0.65 12/06/2016   TSH 2.24 01/22/2016    Therapeutic Level Labs: No results found for: LITHIUM No results found for: VALPROATE No components found for:  CBMZ  Current Medications: Current Outpatient Medications  Medication Sig Dispense Refill  . albuterol (PROVENTIL HFA;VENTOLIN HFA) 108 (90 Base) MCG/ACT inhaler Inhale 2 puffs into the lungs every 6 (six) hours as needed for wheezing or shortness of breath. 1 Inhaler 2  . aspirin 81 MG tablet Take 81 mg by mouth daily.    . budesonide-formoterol (SYMBICORT) 160-4.5 MCG/ACT inhaler Inhale 2 puffs into the lungs 2 (two) times daily. 1 Inhaler 6  . buPROPion (WELLBUTRIN SR) 150 MG 12 hr tablet Take 1 tablet (150 mg total) by mouth 2 (two) times daily. 180 tablet 1  . busPIRone (BUSPAR) 10 MG tablet Take 1 tablet (10 mg total) by mouth 3 (three) times daily. 90 tablet 1  . Cyanocobalamin (VITAMIN B-12 PO) Take 1 tablet by mouth daily.    . ferrous fumarate (HEMOCYTE - 106 MG FE) 325 (106 FE) MG TABS Take 1 tablet by mouth daily. Reported on 10/06/2015    . levothyroxine (SYNTHROID, LEVOTHROID) 50 MCG tablet TAKE 2 TABLETS (100 MCG TOTAL) BY MOUTH DAILY BEFORE BREAKFAST.  3  . lisinopril (PRINIVIL,ZESTRIL) 5 MG tablet Take 1 tablet (5 mg total) by mouth daily. 90 tablet 1  . Magnesium 250 MG TABS Take 250 mg by mouth 2 (two) times daily.     . simvastatin (ZOCOR) 10 MG tablet Take 1 tablet (10 mg total) by mouth at bedtime. 90 tablet 3  . traZODone (DESYREL) 100 MG tablet Take 2 tablets (200 mg total) by mouth at bedtime. 180 tablet 1  . venlafaxine XR (EFFEXOR-XR) 75 MG 24 hr capsule Take 1 capsule (75 mg  total) by mouth daily with breakfast. 90 capsule 1   No current facility-administered medications for this visit.      Musculoskeletal: Strength & Muscle Tone: within normal limits Gait & Station: normal Patient leans: N/A  Psychiatric Specialty Exam: Review of Systems  Constitutional: Positive for malaise/fatigue. Negative for diaphoresis and weight loss.       Night sweats   Neurological: Positive for dizziness and headaches. Negative for speech change.    Blood pressure 132/76, pulse 80, height 5' 4.5" (1.638 m), weight 165 lb 8 oz (75.1 kg).Body mass index is 27.97 kg/m.  General Appearance: Fairly Groomed  Eye Contact:  Good  Speech:  Clear and Coherent and Normal Rate  Volume:  Normal  Mood:  Euthymic  Affect:  Full Range  Thought Process:  Goal Directed and Descriptions of Associations: Intact  Orientation:  Full (Time, Place, and Person)  Thought Content: Logical   Suicidal Thoughts:  No  Homicidal Thoughts:  No  Memory:  Immediate;   Good Recent;   Good Remote;   Good  Judgement:  Good  Insight:  Good  Psychomotor Activity:  Normal  Concentration:  Concentration: Good and Attention Span: Good  Recall:  Good  Fund of Knowledge: Good  Language: Good  Akathisia:  No  Handed:  Right  AIMS (if indicated): not done  Assets:  Communication Skills Desire for Improvement Financial Resources/Insurance Housing Leisure Time Social Support Talents/Skills Transportation  ADL's:  Intact  Cognition: WNL  Sleep:  Good   Screenings: AUDIT     Counselor from 09/10/2015 in [place]  Alcohol Use Disorder Identification Test Final Score (AUDIT)  34    CAGE-AID  Counselor from 09/10/2015 in [place]  CAGE-AID Score  4    GAD-7     Counselor from 09/14/2015 in [place]  Total GAD-7 Score  7    PHQ2-9     Office Visit from 02/16/2017 in [place] Office Visit from 12/12/2016 in [place] Counselor from 11/05/2015 in [place] Office Visit from 10/16/2015 in [place] Counselor from 09/10/2015 in [place]  PHQ-2 Total Score  0  2  2  0  2  PHQ-9 Total Score  -  6  9  -  9      I reviewed the information below on 11/09/2017 and agree except where noted/changed Assessment and Plan: MDD-recurrent, moderate; GAD; insomnia; alcohol use disorder in remission; nicotine dependence     Medication management with supportive therapy. Risks and benefits, side effects and alternative treatment options discussed with patient. Pt was given an opportunity to ask questions about medication, illness, and treatment. All current psychiatric medications have been reviewed and discussed with the patient and adjusted as clinically appropriate. The patient has been provided an accurate and updated list of the medications being now prescribed. Patient expressed understanding of how their medications were to be used.  Pt verbalized understanding and verbal consent obtained for treatment.  The risk of un-intended pregnancy is low based on the fact that pt reports she is post menopausal. Pt is aware that these meds carry a teratogenic risk. Pt will discuss plan of action if she does or plans to become pregnant in the future.  Status of current problems: improved  Meds:  Effexor XR 75 mg p.o. daily for MDD and GAD Wellbutrin SR 150 mg p.o. twice daily for MDD.  It is not effective for smoking cessation Trazodone 200 mg p.o. nightly for insomnia BuSpar 10 mg p.o. 3 times daily for GAD   Labs: reviewed done on 10/25/2017  Therapy: brief supportive therapy provided. Discussed psychosocial stressors in detail.     Consultations: Encouraged to follow up with PCP as needed  Pt denies SI and is at an acute low risk for  suicide. Patient told to call clinic if any problems occur. Patient advised to go to ER if they should develop SI/HI, side effects, or if symptoms worsen. Has crisis numbers to call if needed. Pt verbalized understanding.  F/up in 4 months or sooner if needed  The duration of this appointment visit was 30 minutes of face-to-face time with the patient.  Greater than 50% of this time was spent in counseling, explanation of  diagnosis, planning of further management, and coordination of care     [name], MD 11/09/2017, 10:21 AM

## 2017-11-16 ENCOUNTER — Other Ambulatory Visit (HOSPITAL_COMMUNITY)
Admission: RE | Admit: 2017-11-16 | Discharge: 2017-11-16 | Disposition: A | Discharge status: Home / Self Care | Payer: Medicare Other | Source: Ambulatory Visit | Attending: Otolaryngology | Admitting: Otolaryngology

## 2017-11-16 ENCOUNTER — Other Ambulatory Visit: Payer: Self-pay | Admitting: Otolaryngology

## 2017-11-23 ENCOUNTER — Telehealth: Payer: Self-pay

## 2017-11-23 ENCOUNTER — Encounter: Payer: Self-pay | Admitting: Oncology

## 2017-11-23 ENCOUNTER — Inpatient Hospital Stay: Payer: Medicare Other | Attending: Hematology and Oncology | Admitting: Oncology

## 2017-11-23 VITALS — BP 123/60 | HR 67 | Temp 98.5°F | Resp 18 | Ht 64.5 in | Wt 164.2 lb

## 2017-11-23 DIAGNOSIS — Z800 Family history of malignant neoplasm of digestive organs: Secondary | ICD-10-CM | POA: Diagnosis not present

## 2017-11-23 DIAGNOSIS — E039 Hypothyroidism, unspecified: Secondary | ICD-10-CM | POA: Insufficient documentation

## 2017-11-23 DIAGNOSIS — C8338 Diffuse large B-cell lymphoma, lymph nodes of multiple sites: Secondary | ICD-10-CM | POA: Diagnosis not present

## 2017-11-23 DIAGNOSIS — C8330 Diffuse large B-cell lymphoma, unspecified site: Secondary | ICD-10-CM | POA: Insufficient documentation

## 2017-11-23 DIAGNOSIS — Z5112 Encounter for antineoplastic immunotherapy: Secondary | ICD-10-CM | POA: Insufficient documentation

## 2017-11-23 DIAGNOSIS — Z5111 Encounter for antineoplastic chemotherapy: Secondary | ICD-10-CM | POA: Diagnosis present

## 2017-11-23 DIAGNOSIS — F17210 Nicotine dependence, cigarettes, uncomplicated: Secondary | ICD-10-CM | POA: Diagnosis not present

## 2017-11-23 DIAGNOSIS — E7800 Pure hypercholesterolemia, unspecified: Secondary | ICD-10-CM | POA: Diagnosis not present

## 2017-11-23 DIAGNOSIS — Z8572 Personal history of non-Hodgkin lymphomas: Secondary | ICD-10-CM | POA: Insufficient documentation

## 2017-11-23 DIAGNOSIS — I10 Essential (primary) hypertension: Secondary | ICD-10-CM | POA: Insufficient documentation

## 2017-11-23 DIAGNOSIS — Z5189 Encounter for other specified aftercare: Secondary | ICD-10-CM | POA: Diagnosis not present

## 2017-11-23 DIAGNOSIS — C8580 Other specified types of non-Hodgkin lymphoma, unspecified site: Secondary | ICD-10-CM

## 2017-11-23 HISTORY — DX: Diffuse large B-cell lymphoma, unspecified site: C83.30

## 2017-11-23 NOTE — Progress Notes (Signed)
START ON PATHWAY REGIMEN - Lymphoma and CLL     A cycle is every 21 days:     Prednisone      Rituximab      Cyclophosphamide      Doxorubicin      Vincristine   **Always confirm dose/schedule in your pharmacy ordering system**  Patient Characteristics: Diffuse Large B-Cell Lymphoma, First Line, Stage III and IV Disease Type: Not Applicable Disease Type: Diffuse Large B-Cell Lymphoma Disease Type: Not Applicable Line of therapy: First Line [name] Stage: III Intent of Therapy: Curative Intent, Discussed with Patient 

## 2017-11-23 NOTE — Progress Notes (Signed)
[place] OFFICE PROGRESS NOTE   Diagnosis: Non-Hodgkin's lymphoma  INTERVAL HISTORY:   [name] returns for a scheduled visit.  She saw Dr. Erik Obey on 5/68/1275.  He palpated right-sided neck lymph nodes.  She was taken to the operating room for biopsy of 2 right posterior triangle nodes on 11/16/2017.  The pathology [id number]) confirmed diffuse large B-cell lymphoma.  CD20, BCL-2, and BCL 6+.  The Ki-67 returned at 70%.  She reports malaise.  No anorexia, weight loss, fever, or night sweats.  No new complaint.  Objective:  Vital signs in last 24 hours:  Blood pressure 123/60, pulse 67, temperature 98.5 F (36.9 C), temperature source Oral, resp. rate 18, height 5' 4.5" (1.638 m), weight 164 lb 3.2 oz (74.5 kg), SpO2 98 %.    HEENT: Steri-Strips in place at the right neck surgical sites.  Post operative change versus remaining 1.5 mm lymph node at the medial surgical site. Lymphatics: Firm 0.5 cm right supraclavicular node, 2 cm left axillary node.  No other cervical, supraclavicular, axillary, or inguinal nodes Resp: Lungs clear bilaterally Cardio: Regular rate and rhythm GI: No hepatosplenomegaly Vascular: No leg edema   Lab Results:  Lab Results  Component Value Date   WBC 6.8 10/25/2017   HGB 14.0 10/25/2017   HCT 43.2 10/25/2017   MCV 95.2 10/25/2017   PLT 400 10/25/2017   NEUTROABS 5.2 10/25/2017    CMP  Lab Results  Component Value Date   NA 144 10/25/2017   K 4.9 10/25/2017   CL 102 10/25/2017   CO2 34 (H) 10/25/2017   GLUCOSE 85 10/25/2017   BUN 9 10/25/2017   CREATININE 0.78 10/25/2017   CALCIUM 10.2 10/25/2017   PROT 7.0 10/25/2017   ALBUMIN 3.6 10/25/2017   AST 14 (L) 10/25/2017   ALT 16 10/25/2017   ALKPHOS 85 10/25/2017   BILITOT 0.3 10/25/2017   GFRNONAA >60 10/25/2017   GFRAA >60 10/25/2017   10/25/2017: LDH-196  Medications: I have reviewed the patient's current  medications.   Assessment/Plan: 1. Lymphadenopathy  Ultrasound of the neck 02/18/2017- right supraclavicular adenopathy with at least 3 nodes, largest measuring 1.3 x 1.1 x 1.4 cm; 2 adjacent hypoechoic right occipital lymph nodes  Excisional biopsy right supraclavicular lymphadenopathy 49/09/7589 (Dr. Deetta Perla lymphoid proliferation.  Necrotic soft tissue with associated acute and chronic inflammation.  By flow cytometry no monoclonal B-cell or phenotypically aberrant T-cell population.  Special stains for microorganisms including AFB, GMS, PAS and Warthin-Starry, are negative.  CT chest 05/30/2017- multiple enlarged right axillary lymph nodes.  Largest right axillary lymph node measures 2.6 x 2.2 cm.  Single enlarged left axillary lymph node measuring 16 mm.  CT neck 06/03/2017- known bilateral axillary lymphadenopathy partially covered on the scan.  No lymphadenopathy seen in the neck.  Biopsy right axillary lymph node 06/22/2017 (Dr. Georgette Dover)- fibroadipose tissue with necrosis, chronic inflammation and granulation tissue  PET scan 6/38/4665- hypermetabolic bilateral axillary lymph nodes.  Hypermetabolic but small right supraclavicular lymph nodes.  In the right omentum there is a small focus of rim density and internal fat density which is hypermetabolic.  CT neck 10/25/2017- progressed bilateral subclavian lymphadenopathy since February, right greater than left.  Conspicuous enlargement of the small lymph nodes at the right level 2 and 3 nodal stations.  CT chest/abdomen/pelvis 10/25/2017- progressive adenopathy identified within the left axilla, bilateral supraclavicular stations and left inguinal region.  Persistent but improved right axillary adenopathy.  Scattered indeterminate low-attenuation foci within the spleen not seen  on study from 05/30/2017.  Excisional biopsy of right supraclavicular and posterior triangle lymph nodes on 11/16/2017-diffuse large B-cell lymphoma, CD20  positive 2. Hypertension 3. Hypothyroid 4. Hypercholesterolemia 5. Family history significant for colon cancer, stomach cancer and brain cancer 6. Ongoing tobacco use   Disposition: Ms. Bartling has been diagnosed with large B-cell lymphoma.  I discussed the diagnosis, prognosis, and treatment options with Ms. Marcos and her family.  She appears to have advanced stage disease based on the staging evaluation to date.  I recommend treatment with R-CHOP.  She will be referred for a baseline staging PET scan, echocardiogram, and Port-A-Cath placement.  She will attend a chemotherapy teaching class and return for baseline laboratory studies on 11/24/2017.  Ms. Forquer and her family understand the prognosis will depend on the clinical stage following the repeat lab studies and PET scan.  I reviewed potential toxicities associated with the R-CHOP regimen.  She understands the potential for nausea/vomiting, mucositis, alopecia, and hematologic toxicity.  We discussed the cardiac toxicities associated with doxorubicin.  We discussed the vesicant property of doxorubicin and vincristine.  We discussed the chance for neuropathy from vincristine.  We reviewed the allergic reaction, reactivation of hepatitis, atypical infections, hematologic toxicity, pneumonitis, and CNS toxicity associated with rituximab.  She agrees to proceed.  40 minutes were spent with the patient today.  The majority of the time was used for counseling and coordination of care.  Betsy Coder, MD  11/23/2017  8:13 AM

## 2017-11-23 NOTE — Telephone Encounter (Signed)
Printed avs and calender of upcoming appointment. Per 8/1 [place] approved appointment with her on 8/12 @ 8:45 am.

## 2017-11-24 ENCOUNTER — Other Ambulatory Visit: Payer: Self-pay

## 2017-11-24 ENCOUNTER — Inpatient Hospital Stay: Payer: Medicare Other

## 2017-11-24 ENCOUNTER — Other Ambulatory Visit: Payer: Self-pay | Admitting: Nurse Practitioner

## 2017-11-24 DIAGNOSIS — Z5189 Encounter for other specified aftercare: Secondary | ICD-10-CM | POA: Diagnosis not present

## 2017-11-24 DIAGNOSIS — Z95828 Presence of other vascular implants and grafts: Secondary | ICD-10-CM

## 2017-11-24 DIAGNOSIS — C8580 Other specified types of non-Hodgkin lymphoma, unspecified site: Secondary | ICD-10-CM

## 2017-11-24 DIAGNOSIS — C8330 Diffuse large B-cell lymphoma, unspecified site: Secondary | ICD-10-CM

## 2017-11-24 LAB — CBC WITH DIFFERENTIAL (CANCER CENTER ONLY)
Basophils Absolute: 0.1 10*3/uL (ref 0.0–0.1)
Basophils Relative: 1 %
Eosinophils Absolute: 0.1 10*3/uL (ref 0.0–0.5)
Eosinophils Relative: 1 %
HCT: 42.5 % (ref 34.8–46.6)
Hemoglobin: 14.1 g/dL (ref 11.6–15.9)
Lymphocytes Relative: 11 %
Lymphs Abs: 0.8 10*3/uL — ABNORMAL LOW (ref 0.9–3.3)
MCH: 30.5 pg (ref 25.1–34.0)
MCHC: 33.1 g/dL (ref 31.5–36.0)
MCV: 92.1 fL (ref 79.5–101.0)
Monocytes Absolute: 0.6 10*3/uL (ref 0.1–0.9)
Monocytes Relative: 8 %
Neutro Abs: 5.7 10*3/uL (ref 1.5–6.5)
Neutrophils Relative %: 79 %
Platelet Count: 271 10*3/uL (ref 145–400)
RBC: 4.62 MIL/uL (ref 3.70–5.45)
RDW: 14.2 % (ref 11.2–14.5)
WBC Count: 7.2 10*3/uL (ref 3.9–10.3)

## 2017-11-24 LAB — CMP (CANCER CENTER ONLY)
ALT: 13 U/L (ref 0–44)
AST: 15 U/L (ref 15–41)
Albumin: 3.7 g/dL (ref 3.5–5.0)
Alkaline Phosphatase: 70 U/L (ref 38–126)
Anion gap: 11 (ref 5–15)
BUN: 10 mg/dL (ref 8–23)
CO2: 25 mmol/L (ref 22–32)
Calcium: 9.2 mg/dL (ref 8.9–10.3)
Chloride: 104 mmol/L (ref 98–111)
Creatinine: 0.72 mg/dL (ref 0.44–1.00)
GFR, Est AFR Am: >60 mL/min (ref >60)
GFR, Estimated: >60 mL/min (ref >60)
Glucose, Bld: 91 mg/dL (ref 70–99)
Potassium: 4.2 mmol/L (ref 3.5–5.1)
Sodium: 140 mmol/L (ref 135–145)
Total Bilirubin: 0.7 mg/dL (ref 0.3–1.2)
Total Protein: 7 g/dL (ref 6.5–8.1)

## 2017-11-24 LAB — LACTATE DEHYDROGENASE: LDH: 163 U/L (ref 98–192)

## 2017-11-24 MED ORDER — PREDNISONE 20 MG PO TABS: 80.0000 mg | ORAL_TABLET | Freq: Every day | ORAL | 5 refills | Status: DC

## 2017-11-24 MED ORDER — LIDOCAINE-PRILOCAINE 2.5-2.5 % EX CREA: TOPICAL_CREAM | CUTANEOUS | 1 refills | Status: DC

## 2017-11-24 MED ORDER — PROCHLORPERAZINE MALEATE 10 MG PO TABS: 10.0000 mg | ORAL_TABLET | Freq: Four times a day (QID) | ORAL | 1 refills | Status: DC | PRN

## 2017-11-25 LAB — HEPATITIS B SURFACE ANTIGEN: Hepatitis B Surface Ag: NEGATIVE

## 2017-11-25 LAB — HEPATITIS B CORE ANTIBODY, TOTAL: Hep B Core Total Ab: NEGATIVE

## 2017-11-28 ENCOUNTER — Encounter (HOSPITAL_COMMUNITY)
Admission: RE | Admit: 2017-11-28 | Discharge: 2017-11-28 | Disposition: A | Discharge status: Home / Self Care | Payer: Medicare Other | Source: Ambulatory Visit | Attending: Oncology | Admitting: Oncology

## 2017-11-28 DIAGNOSIS — C8580 Other specified types of non-Hodgkin lymphoma, unspecified site: Secondary | ICD-10-CM | POA: Diagnosis not present

## 2017-11-28 LAB — GLUCOSE, CAPILLARY: Glucose-Capillary: 87 mg/dL (ref 70–99)

## 2017-11-28 MED ORDER — FLUDEOXYGLUCOSE F - 18 (FDG) INJECTION
8.2100 | Freq: Once | INTRAVENOUS | Status: AC | PRN
  Administered 2017-11-28: 8.21 via INTRAVENOUS

## 2017-11-30 ENCOUNTER — Encounter: Payer: Self-pay | Admitting: Pharmacist

## 2017-11-30 ENCOUNTER — Other Ambulatory Visit: Payer: Self-pay | Admitting: Radiology

## 2017-12-01 ENCOUNTER — Other Ambulatory Visit: Payer: Self-pay

## 2017-12-01 ENCOUNTER — Ambulatory Visit (HOSPITAL_COMMUNITY)
Admission: RE | Admit: 2017-12-01 | Discharge: 2017-12-01 | Disposition: A | Discharge status: Home / Self Care | Payer: Medicare Other | Source: Ambulatory Visit | Attending: Oncology | Admitting: Oncology

## 2017-12-01 ENCOUNTER — Encounter (HOSPITAL_COMMUNITY): Payer: Self-pay

## 2017-12-01 ENCOUNTER — Other Ambulatory Visit: Payer: Self-pay | Admitting: Oncology

## 2017-12-01 ENCOUNTER — Ambulatory Visit (HOSPITAL_BASED_OUTPATIENT_CLINIC_OR_DEPARTMENT_OTHER)
Admission: RE | Admit: 2017-12-01 | Discharge: 2017-12-01 | Disposition: A | Discharge status: Home / Self Care | Payer: Medicare Other | Source: Ambulatory Visit | Attending: Oncology | Admitting: Oncology

## 2017-12-01 DIAGNOSIS — Z818 Family history of other mental and behavioral disorders: Secondary | ICD-10-CM | POA: Diagnosis not present

## 2017-12-01 DIAGNOSIS — F329 Major depressive disorder, single episode, unspecified: Secondary | ICD-10-CM | POA: Diagnosis not present

## 2017-12-01 DIAGNOSIS — E7800 Pure hypercholesterolemia, unspecified: Secondary | ICD-10-CM | POA: Insufficient documentation

## 2017-12-01 DIAGNOSIS — J449 Chronic obstructive pulmonary disease, unspecified: Secondary | ICD-10-CM | POA: Insufficient documentation

## 2017-12-01 DIAGNOSIS — Z9889 Other specified postprocedural states: Secondary | ICD-10-CM | POA: Diagnosis not present

## 2017-12-01 DIAGNOSIS — C8580 Other specified types of non-Hodgkin lymphoma, unspecified site: Secondary | ICD-10-CM

## 2017-12-01 DIAGNOSIS — Z79899 Other long term (current) drug therapy: Secondary | ICD-10-CM | POA: Insufficient documentation

## 2017-12-01 DIAGNOSIS — Z8249 Family history of ischemic heart disease and other diseases of the circulatory system: Secondary | ICD-10-CM | POA: Diagnosis not present

## 2017-12-01 DIAGNOSIS — Z79890 Hormone replacement therapy (postmenopausal): Secondary | ICD-10-CM | POA: Diagnosis not present

## 2017-12-01 DIAGNOSIS — Z86010 Personal history of colonic polyps: Secondary | ICD-10-CM | POA: Insufficient documentation

## 2017-12-01 DIAGNOSIS — E669 Obesity, unspecified: Secondary | ICD-10-CM | POA: Insufficient documentation

## 2017-12-01 DIAGNOSIS — I10 Essential (primary) hypertension: Secondary | ICD-10-CM | POA: Diagnosis not present

## 2017-12-01 DIAGNOSIS — Z7951 Long term (current) use of inhaled steroids: Secondary | ICD-10-CM | POA: Insufficient documentation

## 2017-12-01 DIAGNOSIS — F17210 Nicotine dependence, cigarettes, uncomplicated: Secondary | ICD-10-CM | POA: Insufficient documentation

## 2017-12-01 DIAGNOSIS — Z885 Allergy status to narcotic agent status: Secondary | ICD-10-CM | POA: Diagnosis not present

## 2017-12-01 DIAGNOSIS — F419 Anxiety disorder, unspecified: Secondary | ICD-10-CM | POA: Insufficient documentation

## 2017-12-01 DIAGNOSIS — M1990 Unspecified osteoarthritis, unspecified site: Secondary | ICD-10-CM | POA: Insufficient documentation

## 2017-12-01 DIAGNOSIS — E039 Hypothyroidism, unspecified: Secondary | ICD-10-CM | POA: Diagnosis not present

## 2017-12-01 DIAGNOSIS — Z7982 Long term (current) use of aspirin: Secondary | ICD-10-CM | POA: Diagnosis not present

## 2017-12-01 DIAGNOSIS — Z800 Family history of malignant neoplasm of digestive organs: Secondary | ICD-10-CM | POA: Diagnosis not present

## 2017-12-01 DIAGNOSIS — Z9884 Bariatric surgery status: Secondary | ICD-10-CM | POA: Insufficient documentation

## 2017-12-01 HISTORY — PX: IR IMAGING GUIDED PORT INSERTION: IMG5740

## 2017-12-01 LAB — CBC WITH DIFFERENTIAL/PLATELET
Basophils Absolute: 0 10*3/uL (ref 0.0–0.1)
Basophils Relative: 0 %
Eosinophils Absolute: 0.1 10*3/uL (ref 0.0–0.7)
Eosinophils Relative: 1 %
HCT: 43.8 % (ref 36.0–46.0)
Hemoglobin: 14.4 g/dL (ref 12.0–15.0)
Lymphocytes Relative: 18 %
Lymphs Abs: 1.3 10*3/uL (ref 0.7–4.0)
MCH: 31 pg (ref 26.0–34.0)
MCHC: 32.9 g/dL (ref 30.0–36.0)
MCV: 94.2 fL (ref 78.0–100.0)
Monocytes Absolute: 0.5 10*3/uL (ref 0.1–1.0)
Monocytes Relative: 8 %
Neutro Abs: 4.9 10*3/uL (ref 1.7–7.7)
Neutrophils Relative %: 73 %
Platelets: 308 10*3/uL (ref 150–400)
RBC: 4.65 MIL/uL (ref 3.87–5.11)
RDW: 14.2 % (ref 11.5–15.5)
WBC: 6.8 10*3/uL (ref 4.0–10.5)

## 2017-12-01 LAB — PROTIME-INR
INR: 0.91
Prothrombin Time: 12.1 seconds (ref 11.4–15.2)

## 2017-12-01 MED ORDER — FENTANYL CITRATE (PF) 100 MCG/2ML IJ SOLN
INTRAMUSCULAR | Status: AC | PRN
  Administered 2017-12-01 (×2): 50 ug via INTRAVENOUS

## 2017-12-01 MED ORDER — CEFAZOLIN SODIUM-DEXTROSE 2-4 GM/100ML-% IV SOLN
INTRAVENOUS | Status: AC
  Administered 2017-12-01: 2 g via INTRAVENOUS
  Filled 2017-12-01: qty 100

## 2017-12-01 MED ORDER — SODIUM CHLORIDE 0.9 % IV SOLN
INTRAVENOUS | Status: DC
  Administered 2017-12-01: 13:00:00 via INTRAVENOUS

## 2017-12-01 MED ORDER — CEFAZOLIN SODIUM-DEXTROSE 2-4 GM/100ML-% IV SOLN
2.0000 g | INTRAVENOUS | Status: AC
  Administered 2017-12-01: 2 g via INTRAVENOUS

## 2017-12-01 MED ORDER — HEPARIN SOD (PORK) LOCK FLUSH 100 UNIT/ML IV SOLN
INTRAVENOUS | Status: AC | PRN
  Administered 2017-12-01: 500 [IU] via INTRAVENOUS

## 2017-12-01 MED ORDER — MIDAZOLAM HCL 2 MG/2ML IJ SOLN
INTRAMUSCULAR | Status: AC
  Filled 2017-12-01: qty 4

## 2017-12-01 MED ORDER — LIDOCAINE-EPINEPHRINE (PF) 2 %-1:200000 IJ SOLN
INTRAMUSCULAR | Status: AC | PRN
  Administered 2017-12-01: 20 mL

## 2017-12-01 MED ORDER — FENTANYL CITRATE (PF) 100 MCG/2ML IJ SOLN
INTRAMUSCULAR | Status: AC
  Filled 2017-12-01: qty 2

## 2017-12-01 MED ORDER — HEPARIN SOD (PORK) LOCK FLUSH 100 UNIT/ML IV SOLN
INTRAVENOUS | Status: AC
  Filled 2017-12-01: qty 5

## 2017-12-01 MED ORDER — LIDOCAINE-EPINEPHRINE (PF) 2 %-1:200000 IJ SOLN
INTRAMUSCULAR | Status: AC
  Filled 2017-12-01: qty 20

## 2017-12-01 MED ORDER — MIDAZOLAM HCL 2 MG/2ML IJ SOLN
INTRAMUSCULAR | Status: AC | PRN
  Administered 2017-12-01 (×2): 1 mg via INTRAVENOUS

## 2017-12-01 NOTE — Discharge Instructions (Signed)
Moderate Conscious Sedation, Adult, Care After °These instructions provide you with information about caring for yourself after your procedure. Your health care provider may also give you more specific instructions. Your treatment has been planned according to current medical practices, but problems sometimes occur. Call your health care provider if you have any problems or questions after your procedure. °What can I expect after the procedure? °After your procedure, it is common: °· To feel sleepy for several hours. °· To feel clumsy and have poor balance for several hours. °· To have poor judgment for several hours. °· To vomit if you eat too soon. ° °Follow these instructions at home: °For at least 24 hours after the procedure: ° °· Do not: °? Participate in activities where you could fall or become injured. °? Drive. °? Use heavy machinery. °? Drink alcohol. °? Take sleeping pills or medicines that cause drowsiness. °? Make important decisions or sign legal documents. °? Take care of children on your own. °· Rest. °Eating and drinking °· Follow the diet recommended by your health care provider. °· If you vomit: °? Drink water, juice, or soup when you can drink without vomiting. °? Make sure you have little or no nausea before eating solid foods. °General instructions °· Have a responsible adult stay with you until you are awake and alert. °· Take over-the-counter and prescription medicines only as told by your health care provider. °· If you smoke, do not smoke without supervision. °· Keep all follow-up visits as told by your health care provider. This is important. °Contact a health care provider if: °· You keep feeling nauseous or you keep vomiting. °· You feel light-headed. °· You develop a rash. °· You have a fever. °Get help right away if: °· You have trouble breathing. °This information is not intended to replace advice given to you by your health care provider. Make sure you discuss any questions you have  with your health care provider. °Document Released: 01/30/2013 Document Revised: 09/14/2015 Document Reviewed: 08/01/2015 °Elsevier Interactive Patient Education © 2018 [employer] Inc[employer] ° ° °Implanted Port Insertion, Care After °This sheet gives you information about how to care for yourself after your procedure. Your health care provider may also give you more specific instructions. If you have problems or questions, contact your health care provider. °What can I expect after the procedure? °After your procedure, it is common to have: °· Discomfort at the port insertion site. °· Bruising on the skin over the port. This should improve over 3-4 days. ° °Follow these instructions at home: °Port care °· After your port is placed, you will get a manufacturer's information card. The card has information about your port. Keep this card with you at all times. °· Take care of the port as told by your health care provider. Ask your health care provider if you or a family member can get training for taking care of the port at home. A home health care nurse may also take care of the port. °· Make sure to remember what type of port you have. °Incision care °· Follow instructions from your health care provider about how to take care of your port insertion site. Make sure you: °? Wash your hands with soap and water before you change your bandage (dressing). If soap and water are not available, use hand sanitizer. °? Change your dressing as told by your health care provider.  You may remove your dressing tomorrow. °? Leave skin glue in place. These skin closures   may need to stay in place for 2 weeks or longer. If adhesive strip edges start to loosen and curl up, you may trim the loose edges. Do not remove adhesive strips completely unless your health care provider tells you to do that.  DO NOT use EMLA cream for 2 weeks after port placement as this cream will remove surgical glue on your incision. °· Check your port insertion site  every day for signs of infection. Check for: °? More redness, swelling, or pain. °? More fluid or blood. °? Warmth. °? Pus or a bad smell. °General instructions °· Do not take baths, swim, or use a hot tub until your health care provider approves.  You may shower tomorrow. °· Do not lift anything that is heavier than 10 lb (4.5 kg) for a week, or as told by your health care provider. °· Ask your health care provider when it is okay to: °? Return to work or school. °? Resume usual physical activities or sports. °· Do not drive for 24 hours if you were given a medicine to help you relax (sedative). °· Take over-the-counter and prescription medicines only as told by your health care provider. °· Wear a medical alert bracelet in case of an emergency. This will tell any health care providers that you have a port. °· Keep all follow-up visits as told by your health care provider. This is important. °Contact a health care provider if: °· You have a fever or chills. °· You have more redness, swelling, or pain around your port insertion site. °· You have more fluid or blood coming from your port insertion site. °· Your port insertion site feels warm to the touch. °· You have pus or a bad smell coming from the port insertion site. °Get help right away if: °· You have chest pain or shortness of breath. °· You have bleeding from your port that you cannot control. °Summary °· Take care of the port as told by your health care provider. °· Change your dressing as told by your health care provider. °· Keep all follow-up visits as told by your health care provider. °This information is not intended to replace advice given to you by your health care provider. Make sure you discuss any questions you have with your health care provider. °Document Released: 01/30/2013 Document Revised: 03/02/2016 Document Reviewed: 03/02/2016 °Elsevier Interactive Patient Education © 2017 Elsevier Inc. ° °

## 2017-12-01 NOTE — Procedures (Signed)
  Procedure: R IJ Port placement   EBL:   minimal Complications:  none immediate  See full dictation in [facility] PACS.  D. Walt Geathers MD Main # [phone] Pager  [phone]    

## 2017-12-01 NOTE — Progress Notes (Signed)
  Echocardiogram 2D Echocardiogram has been performed.  Bobbye Charleston 12/01/2017, 12:07 PM

## 2017-12-01 NOTE — Consult Note (Signed)
Chief Complaint: Patient was seen in consultation today for Port-A-Cath placement  Referring Physician(s): Sherrill,Gary B  Supervising Physician: Arne Cleveland  Patient Status: [place]  History of Present Illness: Kendra Ball is a 72 y.o. 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  Past Medical History:  Diagnosis Date  . Adenopathy    right supraclavicular  . Anxiety   . Arthritis   . COPD (chronic obstructive pulmonary disease) ([place])   . DEPRESSION 08/26/2008  . Hx of colonic polyps    First noted on  colonoscopy 2012  . Hypercholesteremia   . HYPERTENSION 08/26/2008  . HYPOTHYROIDISM 08/26/2008  . Obesity   . Osteoporosis   . Substance abuse [facility])    inpatient and outpatient tx for substance abuse  . TOBACCO USER 12/25/2009    Past Surgical History:  Procedure Laterality Date  . AXILLARY LYMPH NODE BIOPSY Right 06/22/2017   Procedure: RIGHT AXILLARY LYMPH NODE BIOPSY;  Surgeon: [name], MD;  Location: [facility];  Service: General;  Laterality: Right;  . [place] SURGERY  2006  . CESAREAN SECTION    . COLON RESECTION N/A 05/09/2014   Procedure: LAPAROSCOPIC HAND-ASSISTED EXTENDED RIGHT COLECTOMY, LAPAROSCOPIC LYSIS OF ADHESIONS, SPLENIC FLEXURE MOBILIZATION.;  Surgeon: [name], MD;  Location: [facility];  Service: General;  Laterality: N/A;  . DILATION AND CURETTAGE OF UTERUS     x2  . FOREARM FRACTURE SURGERY     right  . GASTRIC BYPASS  2006  . MASS EXCISION Right 02/24/2017   Procedure: EXCISIONAL BIOPSY RIGHT SUPRA-CLAVICULAR NODE;  Surgeon: [name], MD;  Location: [place];  Service: ENT;  Laterality: Right;  . TONSILLECTOMY      Allergies: Morphine sulfate  Medications: Prior to Admission medications   Medication Sig Start Date End Date Taking? Authorizing Provider  budesonide-formoterol (SYMBICORT) 160-4.5  MCG/ACT inhaler Inhale 2 puffs into the lungs 2 (two) times daily. 09/20/17  Yes Kuneff, Renee A, DO  buPROPion (WELLBUTRIN SR) 150 MG 12 hr tablet Take 1 tablet (150 mg total) by mouth 2 (two) times daily. 11/09/17  Yes [name], MD  busPIRone (BUSPAR) 10 MG tablet Take 1 tablet (10 mg total) by mouth 3 (three) times daily. 11/09/17  Yes [name], MD  Cyanocobalamin (VITAMIN B-12 PO) Take 1 tablet by mouth daily.   Yes [provider]  ferrous fumarate (HEMOCYTE - 106 MG FE) 325 (106 FE) MG TABS Take 1 tablet by mouth daily. Reported on 10/06/2015   Yes [provider]  levothyroxine (SYNTHROID, LEVOTHROID) 50 MCG tablet TAKE 2 TABLETS (100 MCG TOTAL) BY MOUTH DAILY BEFORE BREAKFAST. 09/12/17  Yes [provider]  lidocaine-prilocaine (EMLA) cream Apply to port 1 hour before use. Do not rub in. Cover with plastic. 11/24/17  Yes Ladell Pier, MD  lisinopril (PRINIVIL,ZESTRIL) 5 MG tablet Take 1 tablet (5 mg total) by mouth daily. 09/27/17  Yes Kuneff, Renee A, DO  Magnesium 250 MG TABS Take 250 mg by mouth 2 (two) times daily.    Yes [provider]  simvastatin (ZOCOR) 10 MG tablet Take 1 tablet (10 mg total) by mouth at bedtime. 09/20/17  Yes Kuneff, Renee A, DO  traZODone (DESYREL) 100 MG tablet Take 2 tablets (200 mg total) by mouth at bedtime. 11/09/17  Yes [name], MD  venlafaxine XR (EFFEXOR-XR) 75 MG 24 hr capsule Take 1 capsule (75 mg total) by mouth daily  with breakfast. 11/09/17  Yes [name], MD  albuterol (PROVENTIL HFA;VENTOLIN HFA) 108 (90 Base) MCG/ACT inhaler Inhale 2 puffs into the lungs every 6 (six) hours as needed for wheezing or shortness of breath. 09/20/17   Kuneff, Renee A, DO  aspirin 81 MG tablet Take 81 mg by mouth daily.    [provider]  predniSONE (DELTASONE) 20 MG tablet Take 4 tablets (80 mg total) by mouth daily with breakfast. Take for 4 days, start the day after chemotherapy. 11/24/17   Ladell Pier, MD  prochlorperazine (COMPAZINE) 10 MG tablet Take 1 tablet (10 mg total) by mouth every 6 (six) hours as needed for nausea or vomiting. 11/24/17   Owens Shark, NP     Family History  Problem Relation Age of Onset  . Cancer Mother        colon  . Dementia Mother   . Depression Mother   . Heart disease Father 93  . Heart disease Sister 78       stents, pacemaker  . Depression Sister   . Heart disease Brother 63       CABG, MI  . Depression Brother   . Obesity Daughter   . Suicidality Daughter   . Bipolar disorder Daughter   . Obesity Son   . Mental illness Daughter        bipolar  . Obesity Daughter   . Depression Sister   . Dementia Paternal Aunt   . Alcohol abuse Maternal Uncle   . Colon cancer Maternal Aunt   . Stomach cancer Maternal Grandfather     Social History   Socioeconomic History  . Marital status: Married    Spouse name: Not on file  . Number of children: 3  . Years of education: Not on file  . Highest education level: Not on file  Occupational History  . Occupation: RETIRED    Employer: [employer]  Social Needs  . Financial resource strain: Not on file  . Food insecurity:    Worry: Not on file    Inability: Not on file  . Transportation needs:    Medical: Not on file    Non-medical: Not on file  Tobacco Use  . Smoking status: Current Every Day Smoker    Packs/day: 1.00    Years: 50.00    Pack years: 50.00    Types: Cigarettes  . Smokeless tobacco: Never Used  Substance and Sexual Activity  . Alcohol use: No    Comment: quit on Aug 27, 2015  . Drug use: No    Frequency: 20.0 times per week    Comment: last used May 2017  . Sexual activity: Not Currently    Partners: Male    Birth control/protection: Post-menopausal  Lifestyle  . Physical activity:    Days per week: Not on file    Minutes per session: Not on file  . Stress: Not on file  Relationships  . Social connections:    Talks on phone: Not on file    Gets together:  Not on file    Attends religious service: Not on file    Active member of club or organization: Not on file    Attends meetings of clubs or organizations: Not on file    Relationship status: Not on file  Other Topics Concern  . Not on file  Social History Narrative   Ms. Kuyper lives in [place] with her husband. She has 3 grown children and several grand  children. She has joint custody of 3 of her grand children as her daughter has bipolar disorder and has needed assistance with children in past.     Review of Systems currently denies fever, chest pain, dyspnea, cough, abdominal/back pain, nausea, vomiting or bleeding.  She does have occasional headaches.  She continues to smoke.  Vital Signs: BP (!) 115/55   Pulse 65   Temp 97.9 F (36.6 C) (Oral)   Resp 18   Ht 5' 4.5" (1.638 m)   Wt 164 lb 3.9 oz (74.5 kg)   SpO2 97%   BMI 27.76 kg/m   Physical Exam awake, alert.  Chest clear to auscultation bilaterally.  Heart with regular rate and rhythm; underwent soft, positive bowel sounds, nontender.  No lower extremity edema; right lateral/lower neck wound from previous excisional lymph node biopsy mildly tender to palpation.  Imaging: Nm Pet Image Restag (ps) Skull Base To Thigh  Result Date: 11/28/2017 CLINICAL DATA:  Subsequent treatment strategy for large B cell lymphoma. EXAM: NUCLEAR MEDICINE PET SKULL BASE TO THIGH TECHNIQUE: 8.21 mCi F-18 FDG was injected intravenously. Full-ring PET imaging was performed from the skull base to thigh after the radiotracer. CT data was obtained and used for attenuation correction and anatomic localization. Fasting blood glucose: 87 mg/dl COMPARISON:  PET-CT 07/19/2017 FINDINGS: Mediastinal blood pool activity: SUV max 2.42 NECK: 6 mm lymph node superficial to the right sternocleidomastoid muscle and just below the parotid gland on image number 27 is slightly hypermetabolic with SUV max of 0.53. There is also a 5 mm lymph node deep to the  sternocleidomastoid muscle on image number 28 which is hypermetabolic and SUV max is 9.76. These were not present on the prior PET-CT. Incidental CT findings: none CHEST: 8 mm right supraclavicular node on image number 40 has an SUV max of 8.44. This previously measured 5.0 mm and had an SUV max of 6.15. 10 mm left supraclavicular lymph node on image number 44 has an SUV max of 14.24. This is new since prior study. Index right axillary nodal mass on image number 63 measures 25 x 21.5 mm and has an SUV max of 28.91. This previously measured the same but the SUV max was 11.36. Index left axillary lymph node has a maximum transverse diameter of 16 mm on image number 52 and SUV max is 20.27. This previously measured 13.5 mm and SUV max was 2.53. No enlarged or hypermetabolic mediastinal or hilar lymph nodes. No worrisome pulmonary lesions. Incidental CT findings: Stable calcified granuloma in the superior segment of the left lower lobe and stable calcified left hilar and mediastinal lymph nodes. ABDOMEN/PELVIS: No abnormal hypermetabolic activity within the liver, pancreas, adrenal glands, or spleen. No hypermetabolic lymph nodes in the abdomen or pelvis. Bilateral hypermetabolic inguinal adenopathy. This is new since prior study. 3.0 x 1.8 cm left nodal lesion on image number 188 has an SUV max of 15.35. 14 mm right inguinal node on image number 193 has an SUV max of 12.3. Incidental CT findings: Numerous small layering gallstones are noted in the gallbladder. Surgical changes from gastric bypass surgery. Advanced atherosclerotic calcifications involving the aorta and iliac arteries. Surgical changes involving the sigmoid colon. SKELETON: Hypermetabolic focus is noted possibly involving the right scapula anteriorly. Adjacent soft tissue mass measures approximately 15 x 15 mm. SUV max is 22.51. This could be a lymph node with some misregistration or it could be osseous lymphoma breaking out into the soft tissues. I do  not see any obvious  CT abnormality but this is not atypical with lymphoma. Incidental CT findings: none IMPRESSION: 1. Progressive lymphoma as detailed above. There are new hypermetabolic lymph nodes and progressive hypermetabolism in existing lymph nodes. 2. Hypermetabolic focus along the anterior aspect of the right scapular with adjacent soft tissue mass. This could be adenopathy but osseous lymphoma is another possibility. 3. No new adenopathy involving the mediastinal or hilar regions of the chest or the abdomen or pelvis. No obvious involvement of the liver or spleen. Electronically Signed   By: Marijo Sanes M.D.   On: 11/28/2017 12:22    Labs:  CBC: Recent Labs    02/16/17 1120 06/06/17 1542 10/25/17 0936 11/24/17 0924  WBC 7.0 7.5 6.8 7.2  HGB 14.0 15.0 14.0 14.1  HCT 42.5 45.6 43.2 42.5  PLT 305.0 255 400 271    COAGS: No results for input(s): INR, APTT in the last 8760 hours.  BMP: Recent Labs    12/06/16 0823 06/06/17 1542 10/25/17 0936 11/24/17 0924  NA 141 139 144 140  K 4.6 4.4 4.9 4.2  CL 103 101 102 104  CO2 32 28 34* 25  GLUCOSE 90 80 85 91  BUN 10 12 9 10   CALCIUM 9.5 9.7 10.2 9.2  CREATININE 0.74 0.78 0.78 0.72  GFRNONAA  --  >60 >60 >60  GFRAA  --  >60 >60 >60    LIVER FUNCTION TESTS: Recent Labs    12/06/16 0823 06/06/17 1542 10/25/17 0936 11/24/17 0924  BILITOT 0.6 0.5 0.3 0.7  AST 15 18 14* 15  ALT 12 15 16 13   ALKPHOS 58 72 85 70  PROT 6.0 7.3 7.0 7.0  ALBUMIN 4.2 4.0 3.6 3.7    TUMOR MARKERS: No results for input(s): AFPTM, CEA, CA199, CHROMGRNA in the last 8760 hours.  Assessment and Plan:  72 y.o. 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  All of the patient's questions were answered, patient is agreeable to proceed. Consent signed and in chart.  LABS PENDING   Thank you for this interesting consult.  I greatly enjoyed meeting REFUGIA LANEVE and look forward to participating in their care.  A copy of this report was sent to the requesting provider on this date.  Electronically Signed: D. Rowe Robert, PA-C 12/01/2017, 12:55 PM   I spent a total of 25 minutes    in face to face in clinical consultation, greater than 50% of which was counseling/coordinating care for Port-A-Cath placement

## 2017-12-03 ENCOUNTER — Other Ambulatory Visit: Payer: Self-pay | Admitting: Oncology

## 2017-12-04 ENCOUNTER — Inpatient Hospital Stay (HOSPITAL_BASED_OUTPATIENT_CLINIC_OR_DEPARTMENT_OTHER): Payer: Medicare Other | Admitting: Nurse Practitioner

## 2017-12-04 ENCOUNTER — Telehealth: Payer: Self-pay | Admitting: Oncology

## 2017-12-04 ENCOUNTER — Inpatient Hospital Stay: Payer: Medicare Other

## 2017-12-04 ENCOUNTER — Other Ambulatory Visit: Payer: Self-pay | Admitting: Oncology

## 2017-12-04 ENCOUNTER — Encounter: Payer: Self-pay | Admitting: Nurse Practitioner

## 2017-12-04 VITALS — BP 129/57 | HR 71 | Temp 97.7°F | Resp 18 | Ht 64.5 in | Wt 163.1 lb

## 2017-12-04 VITALS — BP 108/52 | HR 78 | Temp 98.1°F | Resp 16

## 2017-12-04 DIAGNOSIS — I10 Essential (primary) hypertension: Secondary | ICD-10-CM

## 2017-12-04 DIAGNOSIS — E7800 Pure hypercholesterolemia, unspecified: Secondary | ICD-10-CM

## 2017-12-04 DIAGNOSIS — F17210 Nicotine dependence, cigarettes, uncomplicated: Secondary | ICD-10-CM

## 2017-12-04 DIAGNOSIS — E039 Hypothyroidism, unspecified: Secondary | ICD-10-CM

## 2017-12-04 DIAGNOSIS — C8338 Diffuse large B-cell lymphoma, lymph nodes of multiple sites: Secondary | ICD-10-CM

## 2017-12-04 DIAGNOSIS — Z800 Family history of malignant neoplasm of digestive organs: Secondary | ICD-10-CM

## 2017-12-04 DIAGNOSIS — C8330 Diffuse large B-cell lymphoma, unspecified site: Secondary | ICD-10-CM | POA: Diagnosis not present

## 2017-12-04 DIAGNOSIS — Z5189 Encounter for other specified aftercare: Secondary | ICD-10-CM | POA: Diagnosis not present

## 2017-12-04 MED ORDER — SODIUM CHLORIDE 0.9% FLUSH
10.0000 mL | INTRAVENOUS | Status: DC | PRN
  Administered 2017-12-04: 10 mL
  Filled 2017-12-04: qty 10

## 2017-12-04 MED ORDER — SODIUM CHLORIDE 0.9 % IV SOLN
750.0000 mg/m2 | Freq: Once | INTRAVENOUS | Status: AC
  Administered 2017-12-04: 1380 mg via INTRAVENOUS
  Filled 2017-12-04: qty 69

## 2017-12-04 MED ORDER — ACETAMINOPHEN 325 MG PO TABS
650.0000 mg | ORAL_TABLET | Freq: Once | ORAL | Status: AC
  Administered 2017-12-04: 650 mg via ORAL

## 2017-12-04 MED ORDER — DIPHENHYDRAMINE HCL 25 MG PO CAPS
50.0000 mg | ORAL_CAPSULE | Freq: Once | ORAL | Status: AC
  Administered 2017-12-04: 50 mg via ORAL

## 2017-12-04 MED ORDER — DEXAMETHASONE SODIUM PHOSPHATE 10 MG/ML IJ SOLN
INTRAMUSCULAR | Status: AC
  Filled 2017-12-04: qty 1

## 2017-12-04 MED ORDER — PALONOSETRON HCL INJECTION 0.25 MG/5ML
0.2500 mg | Freq: Once | INTRAVENOUS | Status: AC
  Administered 2017-12-04: 0.25 mg via INTRAVENOUS

## 2017-12-04 MED ORDER — DIPHENHYDRAMINE HCL 25 MG PO CAPS
ORAL_CAPSULE | ORAL | Status: AC
  Filled 2017-12-04: qty 2

## 2017-12-04 MED ORDER — DOXORUBICIN HCL CHEMO IV INJECTION 2 MG/ML
50.0000 mg/m2 | Freq: Once | INTRAVENOUS | Status: AC
  Administered 2017-12-04: 92 mg via INTRAVENOUS
  Filled 2017-12-04: qty 46

## 2017-12-04 MED ORDER — DEXAMETHASONE SODIUM PHOSPHATE 10 MG/ML IJ SOLN
10.0000 mg | Freq: Once | INTRAMUSCULAR | Status: AC
  Administered 2017-12-04: 10 mg via INTRAVENOUS

## 2017-12-04 MED ORDER — SODIUM CHLORIDE 0.9 % IV SOLN
2.0000 mg | Freq: Once | INTRAVENOUS | Status: AC
  Administered 2017-12-04: 2 mg via INTRAVENOUS
  Filled 2017-12-04: qty 2

## 2017-12-04 MED ORDER — PALONOSETRON HCL INJECTION 0.25 MG/5ML
INTRAVENOUS | Status: AC
  Filled 2017-12-04: qty 5

## 2017-12-04 MED ORDER — HEPARIN SOD (PORK) LOCK FLUSH 100 UNIT/ML IV SOLN
500.0000 [IU] | Freq: Once | INTRAVENOUS | Status: AC | PRN
  Administered 2017-12-04: 500 [IU]
  Filled 2017-12-04: qty 5

## 2017-12-04 MED ORDER — ACETAMINOPHEN 325 MG PO TABS
ORAL_TABLET | ORAL | Status: AC
  Filled 2017-12-04: qty 2

## 2017-12-04 MED ORDER — SODIUM CHLORIDE 0.9 % IV SOLN
375.0000 mg/m2 | Freq: Once | INTRAVENOUS | Status: AC
  Administered 2017-12-04: 700 mg via INTRAVENOUS
  Filled 2017-12-04: qty 50

## 2017-12-04 MED ORDER — SODIUM CHLORIDE 0.9 % IV SOLN
Freq: Once | INTRAVENOUS | Status: AC
  Administered 2017-12-04: 10:00:00 via INTRAVENOUS
  Filled 2017-12-04: qty 250

## 2017-12-04 NOTE — Telephone Encounter (Signed)
Appts scheduled AVS/Calendar printed per 8/12 los °

## 2017-12-04 NOTE — Progress Notes (Addendum)
[facility] OFFICE PROGRESS NOTE   Diagnosis: Non-Hodgkin's lymphoma  INTERVAL HISTORY:   Kendra Ball returns as scheduled.  She denies fevers or sweats.  She has a good appetite. She notes mild discomfort at the Port-A-Cath site.  She has not noticed any new lymph nodes.  Objective:  Vital signs in last 24 hours:  Blood pressure (!) 129/57, pulse 71, temperature 97.7 F (36.5 C), temperature source Oral, resp. rate 18, height 5' 4.5" (1.638 m), weight 163 lb 1.6 oz (74 kg), SpO2 97 %.    HEENT: No thrush or ulcers. Lymphatics: Firm half centimeter right supraclavicular lymph node; smaller high right anterior cervical lymph node.  2 cm left axillary lymph node.  No palpable right axillary or inguinal lymph nodes. Resp: Lungs clear bilaterally. Cardio: Regular rate and rhythm. GI: Abdomen soft and nontender.  No hepatosplenomegaly. Vascular: No leg edema. Port-A-Cath site with resolving ecchymosis.  Erythema superior to the Port-A-Cath site in a tape distribution.  Lab Results:  Lab Results  Component Value Date   WBC 6.8 12/01/2017   HGB 14.4 12/01/2017   HCT 43.8 12/01/2017   MCV 94.2 12/01/2017   PLT 308 12/01/2017   NEUTROABS 4.9 12/01/2017    Imaging:  No results found.  Medications: I have reviewed the patient's current medications.  Assessment/Plan: 1. Lymphadenopathy  Ultrasound of the neck 02/18/2017- right supraclavicular adenopathy with at least 3 nodes, largest measuring 1.3 x 1.1 x 1.4 cm; 2 adjacent hypoechoic right occipital lymph nodes  Excisional biopsy right supraclavicular lymphadenopathy 83/06/8248 (Dr. Deetta Perla lymphoid proliferation. Necrotic soft tissue with associated acute and chronic inflammation.By flow cytometry no monoclonal B-cell or phenotypically aberrant T-cell population. Special stains for microorganisms including AFB, GMS, PAS and Warthin-Starry, are negative.  CT chest 05/30/2017- multiple enlarged right  axillary lymph nodes. Largest right axillary lymph node measures 2.6 x 2.2 cm. Single enlarged left axillary lymph node measuring 16 mm.  CT neck 06/03/2017- known bilateral axillary lymphadenopathy partially covered on the scan. No lymphadenopathy seen in the neck.  Biopsy right axillary lymph node 06/22/2017(Dr. Tsuei)- fibroadipose tissue with necrosis, chronic inflammation and granulation tissue  PET scan 5/39/7673- hypermetabolic bilateral axillary lymph nodes. Hypermetabolic but small right supraclavicular lymph nodes. In the right omentum there is a small focus of rim density and internal fat density which is hypermetabolic.  CT neck 10/25/2017- progressedbilateral subclavian lymphadenopathy since February, right greater than left. Conspicuous enlargement of the small lymph nodes at the right level 2 and 3 nodal stations.  CT chest/abdomen/pelvis 10/25/2017- progressive adenopathy identified within the left axilla, bilateral supraclavicular stations and left inguinal region. Persistent but improved right axillary adenopathy. Scattered indeterminate low-attenuation foci within the spleen not seen on study from 05/30/2017.  Excisional biopsy of right supraclavicular and posterior triangle lymph nodes on 11/16/2017-diffuse large B-cell lymphoma, CD20 positive  PET scan 11/28/2017- new hypermetabolic lymph nodes and progressive hypermetabolism in existing lymph nodes.  Hypermetabolic focus along the anterior aspect of the right scapula with adjacent soft tissue mass.  No new adenopathy involving the mediastinal or hilar regions of the chest or the abdomen or pelvis.  No obvious involvement of the liver or spleen.   IPI score 3  Cycle 1 CHOP/Rituxan 12/04/2017 2. Hypertension  3. Hypothyroid 4. Hypercholesterolemia 5. Family history significant for colon cancer, stomach cancer and brain cancer 6. Ongoing tobacco use 7. Port-A-Cath placement 12/01/2017, Interventional Radiology   Disposition:  Kendra Ball appears stable.  Plan to proceed with cycle 1 CHOP/Rituxan today  as scheduled.  She has attended the chemotherapy education class.  Questions were answered.  She will receive Neulasta 12/05/2017 or 12/06/2017.  She will return for a nadir CBC on 12/14/2017.  She will return for lab, follow-up and cycle 2 CHOP/Rituxan on 12/26/2017.  She will contact the office in the interim with any problems.  Patient seen with Dr. Benay Spice.  PET scan images reviewed with Ms. Velie and her husband on the computer.  25 minutes were spent face-to-face at today's visit with the majority of that time involved in counseling/coordination of care.    [name] ANP/GNP-BC   12/04/2017  9:09 AM  This was a shared visit with [name].  Ms. Rosamilia has been diagnosed with advanced stage non-Hodgkin's lymphoma.  The plan is to begin CHOP-rituximab today.  We reviewed the PET images and discussed the treatment plan with Ms. [place].  She agrees to proceed.  Julieanne Manson, MD

## 2017-12-04 NOTE — Progress Notes (Signed)
OK to treat today with CMET results from  11/24/17 as per Lattie Haw, NP. Per Dr. Benay Spice,  [place] for pt to receive Neulasta on Wed  12/06/17.  Schedule message sent.  Pt understood to call scheduling dept for injection time on Wed.

## 2017-12-04 NOTE — Patient Instructions (Signed)
Kendra Ball Discharge Instructions for Patients Receiving Chemotherapy  Today you received the following chemotherapy agents :  Doxorubicin, Vincristine, Cyclophosphamide, Rituxan.  To help prevent nausea and vomiting after your treatment, we encourage you to take your nausea medication as prescribed.    TAKE PREDNISONE  80 MG DAILY FOR 4 DAYS - START DAY AFTER CHEMOTHERAPY.  TAKE WITH FOOD.  TAKE  COMPAZINE 10 MG EVERY 6 HOURS AS NEEDED FOR NAUSEA.  DO NOT DRIVE SINCE THE MEDICATION CAN CAUSE DROWSINESS.  DO DRINK LOTS OF FLUIDS AS TOLERATED.  DO NOT EAT GREASY NOR SPICY FOODS.     If you develop nausea and vomiting that is not controlled by your nausea medication, call the clinic.   BELOW ARE SYMPTOMS THAT SHOULD BE REPORTED IMMEDIATELY:  *FEVER GREATER THAN 100.5 F  *CHILLS WITH OR WITHOUT FEVER  NAUSEA AND VOMITING THAT IS NOT CONTROLLED WITH YOUR NAUSEA MEDICATION  *UNUSUAL SHORTNESS OF BREATH  *UNUSUAL BRUISING OR BLEEDING  TENDERNESS IN MOUTH AND THROAT WITH OR WITHOUT PRESENCE OF ULCERS  *URINARY PROBLEMS  *BOWEL PROBLEMS  UNUSUAL RASH Items with * indicate a potential emergency and should be followed up as soon as possible.  Feel free to call the clinic should you have any questions or concerns. The clinic phone number is [phone].  Please show the [place] at check-in to the Emergency Department and triage nurse.

## 2017-12-05 ENCOUNTER — Telehealth: Payer: Self-pay | Admitting: Oncology

## 2017-12-05 ENCOUNTER — Inpatient Hospital Stay: Payer: Medicare Other

## 2017-12-05 MED ORDER — PEGFILGRASTIM-CBQV 6 MG/0.6ML ~~LOC~~ SOSY
PREFILLED_SYRINGE | SUBCUTANEOUS | Status: AC
  Filled 2017-12-05: qty 0.6

## 2017-12-05 NOTE — Telephone Encounter (Signed)
Called regarding 8/14

## 2017-12-06 ENCOUNTER — Ambulatory Visit: Payer: Self-pay

## 2017-12-06 ENCOUNTER — Inpatient Hospital Stay: Payer: Medicare Other

## 2017-12-06 VITALS — BP 113/59 | HR 74 | Temp 98.6°F | Resp 16

## 2017-12-06 DIAGNOSIS — C8338 Diffuse large B-cell lymphoma, lymph nodes of multiple sites: Secondary | ICD-10-CM

## 2017-12-06 DIAGNOSIS — Z5189 Encounter for other specified aftercare: Secondary | ICD-10-CM | POA: Diagnosis not present

## 2017-12-06 MED ORDER — PEGFILGRASTIM-CBQV 6 MG/0.6ML ~~LOC~~ SOSY
6.0000 mg | PREFILLED_SYRINGE | Freq: Once | SUBCUTANEOUS | Status: AC
  Administered 2017-12-06: 6 mg via SUBCUTANEOUS

## 2017-12-06 MED ORDER — PEGFILGRASTIM-CBQV 6 MG/0.6ML ~~LOC~~ SOSY
PREFILLED_SYRINGE | SUBCUTANEOUS | Status: AC
  Filled 2017-12-06: qty 0.6

## 2017-12-06 NOTE — Patient Instructions (Signed)
Pegfilgrastim injection What is this medicine? PEGFILGRASTIM (PEG fil gra stim) is a long-acting granulocyte colony-stimulating factor that stimulates the growth of neutrophils, a type of white blood cell important in the body's fight against infection. It is used to reduce the incidence of fever and infection in patients with certain types of cancer who are receiving chemotherapy that affects the bone marrow, and to increase survival after being exposed to high doses of radiation. This medicine may be used for other purposes; ask your health care provider or pharmacist if you have questions. COMMON BRAND NAME(S): Neulasta What should I tell my health care provider before I take this medicine? They need to know if you have any of these conditions: -kidney disease -latex allergy -ongoing radiation therapy -sickle cell disease -skin reactions to acrylic adhesives (On-Body Injector only) -an unusual or allergic reaction to pegfilgrastim, filgrastim, other medicines, foods, dyes, or preservatives -pregnant or trying to get pregnant -breast-feeding How should I use this medicine? This medicine is for injection under the skin. If you get this medicine at home, you will be taught how to prepare and give the pre-filled syringe or how to use the On-body Injector. Refer to the patient Instructions for Use for detailed instructions. Use exactly as directed. Tell your healthcare provider immediately if you suspect that the On-body Injector may not have performed as intended or if you suspect the use of the On-body Injector resulted in a missed or partial dose. It is important that you put your used needles and syringes in a special sharps container. Do not put them in a trash can. If you do not have a sharps container, call your pharmacist or healthcare provider to get one. Talk to your pediatrician regarding the use of this medicine in children. While this drug may be prescribed for selected conditions,  precautions do apply. Overdosage: If you think you have taken too much of this medicine contact a poison control center or emergency room at once. NOTE: This medicine is only for you. Do not share this medicine with others. What if I miss a dose? It is important not to miss your dose. Call your doctor or health care professional if you miss your dose. If you miss a dose due to an On-body Injector failure or leakage, a new dose should be administered as soon as possible using a single prefilled syringe for manual use. What may interact with this medicine? Interactions have not been studied. Give your health care provider a list of all the medicines, herbs, non-prescription drugs, or dietary supplements you use. Also tell them if you smoke, drink alcohol, or use illegal drugs. Some items may interact with your medicine. This list may not describe all possible interactions. Give your health care provider a list of all the medicines, herbs, non-prescription drugs, or dietary supplements you use. Also tell them if you smoke, drink alcohol, or use illegal drugs. Some items may interact with your medicine. What should I watch for while using this medicine? You may need blood work done while you are taking this medicine. If you are going to need a MRI, CT scan, or other procedure, tell your doctor that you are using this medicine (On-Body Injector only). What side effects may I notice from receiving this medicine? Side effects that you should report to your doctor or health care professional as soon as possible: -allergic reactions like skin rash, itching or hives, swelling of the face, lips, or tongue -dizziness -fever -pain, redness, or irritation at site   where injected -pinpoint red spots on the skin -red or dark-brown urine -shortness of breath or breathing problems -stomach or side pain, or pain at the shoulder -swelling -tiredness -trouble passing urine or change in the amount of urine Side  effects that usually do not require medical attention (report to your doctor or health care professional if they continue or are bothersome): -bone pain -muscle pain This list may not describe all possible side effects. Call your doctor for medical advice about side effects. You may report side effects to FDA at 1-800-FDA-1088. Where should I keep my medicine? Keep out of the reach of children. Store pre-filled syringes in a refrigerator between 2 and 8 degrees C (36 and 46 degrees F). Do not freeze. Keep in carton to protect from light. Throw away this medicine if it is left out of the refrigerator for more than 48 hours. Throw away any unused medicine after the expiration date. NOTE: This sheet is a summary. It may not cover all possible information. If you have questions about this medicine, talk to your doctor, pharmacist, or health care provider.  2018 Elsevier/Gold Standard (2016-04-07 12:58:03)  

## 2017-12-07 ENCOUNTER — Other Ambulatory Visit (HOSPITAL_COMMUNITY): Payer: Self-pay | Admitting: Psychiatry

## 2017-12-07 DIAGNOSIS — F411 Generalized anxiety disorder: Secondary | ICD-10-CM

## 2017-12-07 DIAGNOSIS — F1020 Alcohol dependence, uncomplicated: Secondary | ICD-10-CM

## 2017-12-07 DIAGNOSIS — F331 Major depressive disorder, recurrent, moderate: Secondary | ICD-10-CM

## 2017-12-12 ENCOUNTER — Ambulatory Visit: Payer: Self-pay | Admitting: Oncology

## 2017-12-12 ENCOUNTER — Ambulatory Visit: Payer: Self-pay

## 2017-12-14 ENCOUNTER — Inpatient Hospital Stay: Payer: Medicare Other

## 2017-12-14 DIAGNOSIS — Z5189 Encounter for other specified aftercare: Secondary | ICD-10-CM | POA: Diagnosis not present

## 2017-12-14 DIAGNOSIS — C8330 Diffuse large B-cell lymphoma, unspecified site: Secondary | ICD-10-CM

## 2017-12-14 LAB — CBC WITH DIFFERENTIAL (CANCER CENTER ONLY)
Basophils Absolute: 0.1 10*3/uL (ref 0.0–0.1)
Basophils Relative: 1 %
Eosinophils Absolute: 0.1 10*3/uL (ref 0.0–0.5)
Eosinophils Relative: 1 %
HCT: 41.1 % (ref 34.8–46.6)
Hemoglobin: 13.5 g/dL (ref 11.6–15.9)
Lymphocytes Relative: 8 %
Lymphs Abs: 0.8 10*3/uL — ABNORMAL LOW (ref 0.9–3.3)
MCH: 30.8 pg (ref 25.1–34.0)
MCHC: 32.8 g/dL (ref 31.5–36.0)
MCV: 93.8 fL (ref 79.5–101.0)
Monocytes Absolute: 0.8 10*3/uL (ref 0.1–0.9)
Monocytes Relative: 7 %
Neutro Abs: 9.1 10*3/uL — ABNORMAL HIGH (ref 1.5–6.5)
Neutrophils Relative %: 83 %
Platelet Count: 182 10*3/uL (ref 145–400)
RBC: 4.38 MIL/uL (ref 3.70–5.45)
RDW: 14 % (ref 11.2–14.5)
WBC Count: 10.9 10*3/uL — ABNORMAL HIGH (ref 3.9–10.3)
nRBC: 1 /100 WBC — ABNORMAL HIGH

## 2017-12-15 ENCOUNTER — Telehealth: Payer: Self-pay | Admitting: Emergency Medicine

## 2017-12-15 NOTE — Telephone Encounter (Addendum)
Pt verbalized understanding   ----- Message from Owens Shark, NP sent at 12/14/2017  5:32 PM EDT ----- Please let her know CBC looks good. F/U as scheduled.

## 2017-12-25 ENCOUNTER — Other Ambulatory Visit: Payer: Self-pay | Admitting: Oncology

## 2017-12-26 ENCOUNTER — Other Ambulatory Visit: Payer: Self-pay

## 2017-12-26 ENCOUNTER — Ambulatory Visit: Payer: Self-pay | Admitting: Oncology

## 2017-12-27 ENCOUNTER — Inpatient Hospital Stay (HOSPITAL_BASED_OUTPATIENT_CLINIC_OR_DEPARTMENT_OTHER): Payer: Medicare Other | Admitting: Nurse Practitioner

## 2017-12-27 ENCOUNTER — Inpatient Hospital Stay: Payer: Medicare Other

## 2017-12-27 ENCOUNTER — Encounter: Payer: Self-pay | Admitting: Nurse Practitioner

## 2017-12-27 ENCOUNTER — Inpatient Hospital Stay: Payer: Medicare Other | Attending: Hematology and Oncology

## 2017-12-27 VITALS — BP 118/54 | HR 68 | Temp 97.7°F | Resp 18 | Ht 64.5 in | Wt 161.2 lb

## 2017-12-27 VITALS — BP 107/48 | HR 75 | Temp 98.3°F | Resp 17

## 2017-12-27 DIAGNOSIS — E039 Hypothyroidism, unspecified: Secondary | ICD-10-CM

## 2017-12-27 DIAGNOSIS — Z800 Family history of malignant neoplasm of digestive organs: Secondary | ICD-10-CM | POA: Insufficient documentation

## 2017-12-27 DIAGNOSIS — F17210 Nicotine dependence, cigarettes, uncomplicated: Secondary | ICD-10-CM

## 2017-12-27 DIAGNOSIS — K5903 Drug induced constipation: Secondary | ICD-10-CM | POA: Insufficient documentation

## 2017-12-27 DIAGNOSIS — E7800 Pure hypercholesterolemia, unspecified: Secondary | ICD-10-CM

## 2017-12-27 DIAGNOSIS — I10 Essential (primary) hypertension: Secondary | ICD-10-CM

## 2017-12-27 DIAGNOSIS — C8330 Diffuse large B-cell lymphoma, unspecified site: Secondary | ICD-10-CM

## 2017-12-27 DIAGNOSIS — Z5111 Encounter for antineoplastic chemotherapy: Secondary | ICD-10-CM | POA: Diagnosis present

## 2017-12-27 DIAGNOSIS — C8338 Diffuse large B-cell lymphoma, lymph nodes of multiple sites: Secondary | ICD-10-CM

## 2017-12-27 DIAGNOSIS — Z808 Family history of malignant neoplasm of other organs or systems: Secondary | ICD-10-CM

## 2017-12-27 DIAGNOSIS — Z95828 Presence of other vascular implants and grafts: Secondary | ICD-10-CM

## 2017-12-27 DIAGNOSIS — Z5189 Encounter for other specified aftercare: Secondary | ICD-10-CM | POA: Insufficient documentation

## 2017-12-27 DIAGNOSIS — Z5112 Encounter for antineoplastic immunotherapy: Secondary | ICD-10-CM | POA: Insufficient documentation

## 2017-12-27 HISTORY — DX: Presence of other vascular implants and grafts: Z95.828

## 2017-12-27 LAB — CBC WITH DIFFERENTIAL (CANCER CENTER ONLY)
Basophils Absolute: 0.1 10*3/uL (ref 0.0–0.1)
Basophils Relative: 1 %
Eosinophils Absolute: 0.1 10*3/uL (ref 0.0–0.5)
Eosinophils Relative: 1 %
HCT: 39.4 % (ref 34.8–46.6)
Hemoglobin: 13.2 g/dL (ref 11.6–15.9)
Lymphocytes Relative: 10 %
Lymphs Abs: 0.7 10*3/uL — ABNORMAL LOW (ref 0.9–3.3)
MCH: 31.2 pg (ref 25.1–34.0)
MCHC: 33.5 g/dL (ref 31.5–36.0)
MCV: 93.3 fL (ref 79.5–101.0)
Monocytes Absolute: 0.7 10*3/uL (ref 0.1–0.9)
Monocytes Relative: 10 %
Neutro Abs: 5.3 10*3/uL (ref 1.5–6.5)
Neutrophils Relative %: 78 %
Platelet Count: 320 10*3/uL (ref 145–400)
RBC: 4.22 MIL/uL (ref 3.70–5.45)
RDW: 15.4 % — ABNORMAL HIGH (ref 11.2–14.5)
WBC Count: 6.8 10*3/uL (ref 3.9–10.3)

## 2017-12-27 LAB — CMP (CANCER CENTER ONLY)
ALT: 14 U/L (ref 0–44)
AST: 14 U/L — ABNORMAL LOW (ref 15–41)
Albumin: 3.7 g/dL (ref 3.5–5.0)
Alkaline Phosphatase: 58 U/L (ref 38–126)
Anion gap: 8 (ref 5–15)
BUN: 11 mg/dL (ref 8–23)
CO2: 29 mmol/L (ref 22–32)
Calcium: 9.5 mg/dL (ref 8.9–10.3)
Chloride: 104 mmol/L (ref 98–111)
Creatinine: 0.7 mg/dL (ref 0.44–1.00)
GFR, Est AFR Am: >60 mL/min (ref >60)
GFR, Estimated: >60 mL/min (ref >60)
Glucose, Bld: 82 mg/dL (ref 70–99)
Potassium: 4.2 mmol/L (ref 3.5–5.1)
Sodium: 141 mmol/L (ref 135–145)
Total Bilirubin: 0.6 mg/dL (ref 0.3–1.2)
Total Protein: 6.4 g/dL — ABNORMAL LOW (ref 6.5–8.1)

## 2017-12-27 MED ORDER — DIPHENHYDRAMINE HCL 25 MG PO CAPS
ORAL_CAPSULE | ORAL | Status: AC
  Filled 2017-12-27: qty 2

## 2017-12-27 MED ORDER — SODIUM CHLORIDE 0.9 % IV SOLN
Freq: Once | INTRAVENOUS | Status: AC
  Administered 2017-12-27: 12:00:00 via INTRAVENOUS
  Filled 2017-12-27: qty 250

## 2017-12-27 MED ORDER — ACETAMINOPHEN 325 MG PO TABS
650.0000 mg | ORAL_TABLET | Freq: Once | ORAL | Status: AC
  Administered 2017-12-27: 650 mg via ORAL

## 2017-12-27 MED ORDER — SODIUM CHLORIDE 0.9 % IV SOLN
750.0000 mg/m2 | Freq: Once | INTRAVENOUS | Status: AC
  Administered 2017-12-27: 1380 mg via INTRAVENOUS
  Filled 2017-12-27: qty 69

## 2017-12-27 MED ORDER — SODIUM CHLORIDE 0.9% FLUSH
10.0000 mL | INTRAVENOUS | Status: DC | PRN
  Administered 2017-12-27: 10 mL
  Filled 2017-12-27: qty 10

## 2017-12-27 MED ORDER — DIPHENHYDRAMINE HCL 25 MG PO CAPS
50.0000 mg | ORAL_CAPSULE | Freq: Once | ORAL | Status: AC
  Administered 2017-12-27: 50 mg via ORAL

## 2017-12-27 MED ORDER — ACETAMINOPHEN 325 MG PO TABS
ORAL_TABLET | ORAL | Status: AC
  Filled 2017-12-27: qty 2

## 2017-12-27 MED ORDER — HEPARIN SOD (PORK) LOCK FLUSH 100 UNIT/ML IV SOLN
500.0000 [IU] | Freq: Once | INTRAVENOUS | Status: AC | PRN
  Administered 2017-12-27: 500 [IU]
  Filled 2017-12-27: qty 5

## 2017-12-27 MED ORDER — DEXAMETHASONE SODIUM PHOSPHATE 10 MG/ML IJ SOLN
10.0000 mg | Freq: Once | INTRAMUSCULAR | Status: AC
  Administered 2017-12-27: 10 mg via INTRAVENOUS

## 2017-12-27 MED ORDER — SODIUM CHLORIDE 0.9 % IV SOLN: 375.0000 mg/m2 | Freq: Once | INTRAVENOUS | Status: DC

## 2017-12-27 MED ORDER — VINCRISTINE SULFATE CHEMO INJECTION 1 MG/ML
2.0000 mg | Freq: Once | INTRAVENOUS | Status: AC
  Administered 2017-12-27: 2 mg via INTRAVENOUS
  Filled 2017-12-27: qty 2

## 2017-12-27 MED ORDER — DOXORUBICIN HCL CHEMO IV INJECTION 2 MG/ML
50.0000 mg/m2 | Freq: Once | INTRAVENOUS | Status: AC
  Administered 2017-12-27: 92 mg via INTRAVENOUS
  Filled 2017-12-27: qty 46

## 2017-12-27 MED ORDER — PALONOSETRON HCL INJECTION 0.25 MG/5ML
0.2500 mg | Freq: Once | INTRAVENOUS | Status: AC
  Administered 2017-12-27: 0.25 mg via INTRAVENOUS

## 2017-12-27 MED ORDER — PALONOSETRON HCL INJECTION 0.25 MG/5ML
INTRAVENOUS | Status: AC
  Filled 2017-12-27: qty 5

## 2017-12-27 MED ORDER — DEXAMETHASONE SODIUM PHOSPHATE 10 MG/ML IJ SOLN
INTRAMUSCULAR | Status: AC
  Filled 2017-12-27: qty 1

## 2017-12-27 MED ORDER — SODIUM CHLORIDE 0.9 % IV SOLN
375.0000 mg/m2 | Freq: Once | INTRAVENOUS | Status: AC
  Administered 2017-12-27: 700 mg via INTRAVENOUS
  Filled 2017-12-27: qty 20

## 2017-12-27 NOTE — Addendum Note (Signed)
Addended by: Betsy Coder B on: 12/27/2017 11:27 AM   Modules accepted: Orders

## 2017-12-27 NOTE — Progress Notes (Signed)
[place] OFFICE PROGRESS NOTE   Diagnosis: Non-Hodgkin's lymphoma  INTERVAL HISTORY:   [facility] returns as scheduled.  She completed cycle 1 CHOP/Rituxan 12/04/2017.  She had mild nausea for a few days.  No vomiting.  No mouth sores though she did note some mouth discomfort.  She had constipation which was relieved with a laxative.  She noted numbness involving the hands and feet for 3 to 4 days.  She had bone pain following Udenyca.  Objective:  Vital signs in last 24 hours:  Blood pressure (!) 118/54, pulse 68, temperature 97.7 F (36.5 C), temperature source Oral, resp. rate 18, height 5' 4.5" (1.638 m), weight 161 lb 3.2 oz (73.1 kg), SpO2 98 %.    HEENT: No thrush or ulcers. Lymphatics: No palpable cervical, supraclavicular or axillary lymph nodes. Resp: Lungs clear bilaterally. Cardio: Regular rate and rhythm. GI: Abdomen soft and nontender.  No hepatosplenomegaly. Vascular: No leg edema. Port-A-Cath without erythema.  Lab Results:  Lab Results  Component Value Date   WBC 6.8 12/27/2017   HGB 13.2 12/27/2017   HCT 39.4 12/27/2017   MCV 93.3 12/27/2017   PLT 320 12/27/2017   NEUTROABS 5.3 12/27/2017    Imaging:  No results found.  Medications: I have reviewed the patient's current medications.  Assessment/Plan: 1. Non-Hodgkin's lymphoma, diffuse large B-cell lymphoma, CD20 positive  Ultrasound of the neck 02/18/2017- right supraclavicular adenopathy with at least 3 nodes, largest measuring 1.3 x 1.1 x 1.4 cm; 2 adjacent hypoechoic right occipital lymph nodes  Excisional biopsy right supraclavicular lymphadenopathy 68/06/4194 (Dr. Deetta Perla lymphoid proliferation. Necrotic soft tissue with associated acute and chronic inflammation.By flow cytometry no monoclonal B-cell or phenotypically aberrant T-cell population. Special stains for microorganisms including AFB, GMS, PAS and Warthin-Starry, are negative.  CT chest 05/30/2017-  multiple enlarged right axillary lymph nodes. Largest right axillary lymph node measures 2.6 x 2.2 cm. Single enlarged left axillary lymph node measuring 16 mm.  CT neck 06/03/2017- known bilateral axillary lymphadenopathy partially covered on the scan. No lymphadenopathy seen in the neck.  Biopsy right axillary lymph node 06/22/2017(Dr. Tsuei)- fibroadipose tissue with necrosis, chronic inflammation and granulation tissue  PET scan 06/16/9796- hypermetabolic bilateral axillary lymph nodes. Hypermetabolic but small right supraclavicular lymph nodes. In the right omentum there is a small focus of rim density and internal fat density which is hypermetabolic.  CT neck 10/25/2017- progressedbilateral subclavian lymphadenopathy since February, right greater than left. Conspicuous enlargement of the small lymph nodes at the right level 2 and 3 nodal stations.  CT chest/abdomen/pelvis 10/25/2017- progressive adenopathy identified within the left axilla, bilateral supraclavicular stations and left inguinal region. Persistent but improved right axillary adenopathy. Scattered indeterminate low-attenuation foci within the spleen not seen on study from 05/30/2017.  Excisional biopsy of right supraclavicular and posterior triangle lymph nodes on7/25/2019-diffuse large B-cell lymphoma, CD20 positive  PET scan 11/28/2017- new hypermetabolic lymph nodes and progressive hypermetabolism in existing lymph nodes.  Hypermetabolic focus along the anterior aspect of the right scapula with adjacent soft tissue mass.  No new adenopathy involving the mediastinal or hilar regions of the chest or the abdomen or pelvis.  No obvious involvement of the liver or spleen.   IPI score 3  Cycle 1 CHOP/Rituxan 12/04/2017  Cycle 2 CHOP/Rituxan 12/27/2017 2. Hypertension  3. Hypothyroid 4. Hypercholesterolemia 5. Family history significant for colon cancer, stomach cancer and brain cancer 6. Ongoing tobacco use 7. Port-A-Cath  placement 12/01/2017, Interventional Radiology  Disposition: [facility] Ball appears stable.  She  has completed 1 cycle of CHOP/Rituxan.  Plan to proceed with cycle 2 today as scheduled.  We reviewed the CBC from today.  Counts are adequate for treatment.  She will return for lab, follow-up and cycle 3 CHOP/Rituxan in 3 weeks.  She will contact the office in the interim with any problems.  Plan reviewed with Dr. Benay Spice.    [name] ANP/GNP-BC   12/27/2017  10:32 AM

## 2017-12-27 NOTE — Patient Instructions (Signed)
[facility] Discharge Instructions for Patients Receiving Chemotherapy  Today you received the following chemotherapy agents Adriamycin, Vincristine, Cytoxan, Rituxan.  To help prevent nausea and vomiting after your treatment, we encourage you to take your nausea medication as directed.  If you develop nausea and vomiting that is not controlled by your nausea medication, call the clinic.   BELOW ARE SYMPTOMS THAT SHOULD BE REPORTED IMMEDIATELY:  *FEVER GREATER THAN 100.5 F  *CHILLS WITH OR WITHOUT FEVER  NAUSEA AND VOMITING THAT IS NOT CONTROLLED WITH YOUR NAUSEA MEDICATION  *UNUSUAL SHORTNESS OF BREATH  *UNUSUAL BRUISING OR BLEEDING  TENDERNESS IN MOUTH AND THROAT WITH OR WITHOUT PRESENCE OF ULCERS  *URINARY PROBLEMS  *BOWEL PROBLEMS  UNUSUAL RASH Items with * indicate a potential emergency and should be followed up as soon as possible.  Feel free to call the clinic should you have any questions or concerns. The clinic phone number is [phone].  Please show the CHEMO ALERT CARD at check-in to the Emergency Department and triage nurse.   

## 2017-12-28 ENCOUNTER — Ambulatory Visit: Payer: Self-pay

## 2017-12-29 ENCOUNTER — Inpatient Hospital Stay: Payer: Medicare Other

## 2017-12-29 VITALS — BP 104/57 | HR 70 | Temp 97.7°F | Resp 18

## 2017-12-29 DIAGNOSIS — C8338 Diffuse large B-cell lymphoma, lymph nodes of multiple sites: Secondary | ICD-10-CM

## 2017-12-29 DIAGNOSIS — Z5189 Encounter for other specified aftercare: Secondary | ICD-10-CM | POA: Diagnosis not present

## 2017-12-29 MED ORDER — PEGFILGRASTIM-CBQV 6 MG/0.6ML ~~LOC~~ SOSY
PREFILLED_SYRINGE | SUBCUTANEOUS | Status: AC
  Filled 2017-12-29: qty 0.6

## 2017-12-29 MED ORDER — PEGFILGRASTIM-CBQV 6 MG/0.6ML ~~LOC~~ SOSY
6.0000 mg | PREFILLED_SYRINGE | Freq: Once | SUBCUTANEOUS | Status: AC
  Administered 2017-12-29: 6 mg via SUBCUTANEOUS

## 2017-12-29 NOTE — Patient Instructions (Signed)
Pegfilgrastim injection What is this medicine? PEGFILGRASTIM (PEG fil gra stim) is a long-acting granulocyte colony-stimulating factor that stimulates the growth of neutrophils, a type of white blood cell important in the body's fight against infection. It is used to reduce the incidence of fever and infection in patients with certain types of cancer who are receiving chemotherapy that affects the bone marrow, and to increase survival after being exposed to high doses of radiation. This medicine may be used for other purposes; ask your health care provider or pharmacist if you have questions. COMMON BRAND NAME(S): Neulasta What should I tell my health care provider before I take this medicine? They need to know if you have any of these conditions: -kidney disease -latex allergy -ongoing radiation therapy -sickle cell disease -skin reactions to acrylic adhesives (On-Body Injector only) -an unusual or allergic reaction to pegfilgrastim, filgrastim, other medicines, foods, dyes, or preservatives -pregnant or trying to get pregnant -breast-feeding How should I use this medicine? This medicine is for injection under the skin. If you get this medicine at home, you will be taught how to prepare and give the pre-filled syringe or how to use the On-body Injector. Refer to the patient Instructions for Use for detailed instructions. Use exactly as directed. Tell your healthcare provider immediately if you suspect that the On-body Injector may not have performed as intended or if you suspect the use of the On-body Injector resulted in a missed or partial dose. It is important that you put your used needles and syringes in a special sharps container. Do not put them in a trash can. If you do not have a sharps container, call your pharmacist or healthcare provider to get one. Talk to your pediatrician regarding the use of this medicine in children. While this drug may be prescribed for selected conditions,  precautions do apply. Overdosage: If you think you have taken too much of this medicine contact a poison control center or emergency room at once. NOTE: This medicine is only for you. Do not share this medicine with others. What if I miss a dose? It is important not to miss your dose. Call your doctor or health care professional if you miss your dose. If you miss a dose due to an On-body Injector failure or leakage, a new dose should be administered as soon as possible using a single prefilled syringe for manual use. What may interact with this medicine? Interactions have not been studied. Give your health care provider a list of all the medicines, herbs, non-prescription drugs, or dietary supplements you use. Also tell them if you smoke, drink alcohol, or use illegal drugs. Some items may interact with your medicine. This list may not describe all possible interactions. Give your health care provider a list of all the medicines, herbs, non-prescription drugs, or dietary supplements you use. Also tell them if you smoke, drink alcohol, or use illegal drugs. Some items may interact with your medicine. What should I watch for while using this medicine? You may need blood work done while you are taking this medicine. If you are going to need a MRI, CT scan, or other procedure, tell your doctor that you are using this medicine (On-Body Injector only). What side effects may I notice from receiving this medicine? Side effects that you should report to your doctor or health care professional as soon as possible: -allergic reactions like skin rash, itching or hives, swelling of the face, lips, or tongue -dizziness -fever -pain, redness, or irritation at site   where injected -pinpoint red spots on the skin -red or dark-brown urine -shortness of breath or breathing problems -stomach or side pain, or pain at the shoulder -swelling -tiredness -trouble passing urine or change in the amount of urine Side  effects that usually do not require medical attention (report to your doctor or health care professional if they continue or are bothersome): -bone pain -muscle pain This list may not describe all possible side effects. Call your doctor for medical advice about side effects. You may report side effects to FDA at 1-800-FDA-1088. Where should I keep my medicine? Keep out of the reach of children. Store pre-filled syringes in a refrigerator between 2 and 8 degrees C (36 and 46 degrees F). Do not freeze. Keep in carton to protect from light. Throw away this medicine if it is left out of the refrigerator for more than 48 hours. Throw away any unused medicine after the expiration date. NOTE: This sheet is a summary. It may not cover all possible information. If you have questions about this medicine, talk to your doctor, pharmacist, or health care provider.  2018 Elsevier/Gold Standard (2016-04-07 12:58:03)  

## 2018-01-10 ENCOUNTER — Other Ambulatory Visit: Payer: Self-pay | Admitting: Oncology

## 2018-01-16 ENCOUNTER — Encounter: Payer: Self-pay | Admitting: Nurse Practitioner

## 2018-01-16 ENCOUNTER — Inpatient Hospital Stay: Payer: Medicare Other

## 2018-01-16 ENCOUNTER — Inpatient Hospital Stay (HOSPITAL_BASED_OUTPATIENT_CLINIC_OR_DEPARTMENT_OTHER): Payer: Medicare Other | Admitting: Nurse Practitioner

## 2018-01-16 ENCOUNTER — Telehealth: Payer: Self-pay

## 2018-01-16 VITALS — BP 130/56 | HR 80 | Temp 97.8°F | Resp 18 | Ht 64.5 in | Wt 162.7 lb

## 2018-01-16 DIAGNOSIS — E039 Hypothyroidism, unspecified: Secondary | ICD-10-CM | POA: Diagnosis not present

## 2018-01-16 DIAGNOSIS — I10 Essential (primary) hypertension: Secondary | ICD-10-CM

## 2018-01-16 DIAGNOSIS — Z5189 Encounter for other specified aftercare: Secondary | ICD-10-CM | POA: Diagnosis not present

## 2018-01-16 DIAGNOSIS — K5903 Drug induced constipation: Secondary | ICD-10-CM | POA: Diagnosis not present

## 2018-01-16 DIAGNOSIS — E7800 Pure hypercholesterolemia, unspecified: Secondary | ICD-10-CM

## 2018-01-16 DIAGNOSIS — Z95828 Presence of other vascular implants and grafts: Secondary | ICD-10-CM

## 2018-01-16 DIAGNOSIS — C8338 Diffuse large B-cell lymphoma, lymph nodes of multiple sites: Secondary | ICD-10-CM

## 2018-01-16 DIAGNOSIS — Z808 Family history of malignant neoplasm of other organs or systems: Secondary | ICD-10-CM

## 2018-01-16 DIAGNOSIS — F17210 Nicotine dependence, cigarettes, uncomplicated: Secondary | ICD-10-CM

## 2018-01-16 DIAGNOSIS — Z800 Family history of malignant neoplasm of digestive organs: Secondary | ICD-10-CM

## 2018-01-16 LAB — CBC WITH DIFFERENTIAL (CANCER CENTER ONLY)
Basophils Absolute: 0 10*3/uL (ref 0.0–0.1)
Basophils Relative: 0 %
Eosinophils Absolute: 0 10*3/uL (ref 0.0–0.5)
Eosinophils Relative: 0 %
HCT: 38.8 % (ref 34.8–46.6)
Hemoglobin: 12.6 g/dL (ref 11.6–15.9)
Lymphocytes Relative: 10 %
Lymphs Abs: 0.9 10*3/uL (ref 0.9–3.3)
MCH: 31.7 pg (ref 25.1–34.0)
MCHC: 32.5 g/dL (ref 31.5–36.0)
MCV: 97.5 fL (ref 79.5–101.0)
Monocytes Absolute: 0.6 10*3/uL (ref 0.1–0.9)
Monocytes Relative: 7 %
Neutro Abs: 7.5 10*3/uL — ABNORMAL HIGH (ref 1.5–6.5)
Neutrophils Relative %: 83 %
Platelet Count: 298 10*3/uL (ref 145–400)
RBC: 3.98 MIL/uL (ref 3.70–5.45)
RDW: 17.1 % — ABNORMAL HIGH (ref 11.2–14.5)
WBC Count: 9 10*3/uL (ref 3.9–10.3)

## 2018-01-16 LAB — CMP (CANCER CENTER ONLY)
ALT: 15 U/L (ref 0–44)
AST: 16 U/L (ref 15–41)
Albumin: 3.5 g/dL (ref 3.5–5.0)
Alkaline Phosphatase: 59 U/L (ref 38–126)
Anion gap: 8 (ref 5–15)
BUN: 10 mg/dL (ref 8–23)
CO2: 28 mmol/L (ref 22–32)
Calcium: 9.5 mg/dL (ref 8.9–10.3)
Chloride: 104 mmol/L (ref 98–111)
Creatinine: 0.68 mg/dL (ref 0.44–1.00)
GFR, Est AFR Am: >60 mL/min (ref >60)
GFR, Estimated: >60 mL/min (ref >60)
Glucose, Bld: 81 mg/dL (ref 70–99)
Potassium: 3.9 mmol/L (ref 3.5–5.1)
Sodium: 140 mmol/L (ref 135–145)
Total Bilirubin: 0.4 mg/dL (ref 0.3–1.2)
Total Protein: 6.4 g/dL — ABNORMAL LOW (ref 6.5–8.1)

## 2018-01-16 MED ORDER — SODIUM CHLORIDE 0.9% FLUSH
10.0000 mL | INTRAVENOUS | Status: DC | PRN
  Filled 2018-01-16: qty 10

## 2018-01-16 MED ORDER — SODIUM CHLORIDE 0.9 % IV SOLN
750.0000 mg/m2 | Freq: Once | INTRAVENOUS | Status: AC
  Administered 2018-01-16: 1380 mg via INTRAVENOUS
  Filled 2018-01-16: qty 69

## 2018-01-16 MED ORDER — DEXAMETHASONE SODIUM PHOSPHATE 10 MG/ML IJ SOLN
INTRAMUSCULAR | Status: AC
  Filled 2018-01-16: qty 1

## 2018-01-16 MED ORDER — HEPARIN SOD (PORK) LOCK FLUSH 100 UNIT/ML IV SOLN
500.0000 [IU] | Freq: Once | INTRAVENOUS | Status: DC | PRN
  Filled 2018-01-16: qty 5

## 2018-01-16 MED ORDER — PALONOSETRON HCL INJECTION 0.25 MG/5ML
0.2500 mg | Freq: Once | INTRAVENOUS | Status: AC
  Administered 2018-01-16: 0.25 mg via INTRAVENOUS

## 2018-01-16 MED ORDER — ACETAMINOPHEN 325 MG PO TABS
650.0000 mg | ORAL_TABLET | Freq: Once | ORAL | Status: AC
  Administered 2018-01-16: 650 mg via ORAL

## 2018-01-16 MED ORDER — SODIUM CHLORIDE 0.9 % IV SOLN
375.0000 mg/m2 | Freq: Once | INTRAVENOUS | Status: AC
  Administered 2018-01-16: 700 mg via INTRAVENOUS
  Filled 2018-01-16: qty 20

## 2018-01-16 MED ORDER — SODIUM CHLORIDE 0.9 % IV SOLN
Freq: Once | INTRAVENOUS | Status: AC
  Administered 2018-01-16: 12:00:00 via INTRAVENOUS
  Filled 2018-01-16: qty 250

## 2018-01-16 MED ORDER — DEXAMETHASONE SODIUM PHOSPHATE 10 MG/ML IJ SOLN
10.0000 mg | Freq: Once | INTRAMUSCULAR | Status: AC
  Administered 2018-01-16: 10 mg via INTRAVENOUS

## 2018-01-16 MED ORDER — DIPHENHYDRAMINE HCL 25 MG PO CAPS
ORAL_CAPSULE | ORAL | Status: AC
  Filled 2018-01-16: qty 2

## 2018-01-16 MED ORDER — PALONOSETRON HCL INJECTION 0.25 MG/5ML
INTRAVENOUS | Status: AC
  Filled 2018-01-16: qty 5

## 2018-01-16 MED ORDER — SODIUM CHLORIDE 0.9% FLUSH
10.0000 mL | INTRAVENOUS | Status: DC | PRN
  Administered 2018-01-16: 10 mL
  Filled 2018-01-16: qty 10

## 2018-01-16 MED ORDER — VINCRISTINE SULFATE CHEMO INJECTION 1 MG/ML
2.0000 mg | Freq: Once | INTRAVENOUS | Status: AC
  Administered 2018-01-16: 2 mg via INTRAVENOUS
  Filled 2018-01-16: qty 2

## 2018-01-16 MED ORDER — DOXORUBICIN HCL CHEMO IV INJECTION 2 MG/ML
50.0000 mg/m2 | Freq: Once | INTRAVENOUS | Status: AC
  Administered 2018-01-16: 92 mg via INTRAVENOUS
  Filled 2018-01-16: qty 46

## 2018-01-16 MED ORDER — ACETAMINOPHEN 325 MG PO TABS
ORAL_TABLET | ORAL | Status: AC
  Filled 2018-01-16: qty 2

## 2018-01-16 MED ORDER — DIPHENHYDRAMINE HCL 25 MG PO CAPS
50.0000 mg | ORAL_CAPSULE | Freq: Once | ORAL | Status: AC
  Administered 2018-01-16: 50 mg via ORAL

## 2018-01-16 NOTE — Progress Notes (Signed)
[facility] OFFICE PROGRESS NOTE   Diagnosis: Non-Hodgkin's lymphoma  INTERVAL HISTORY:   K[facility] Ball returns as scheduled.  She completed cycle 2 CHOP/Rituxan 12/27/2017.  She denies nausea/vomiting.  No mouth sores though she does note some mouth tenderness and gum bleeding 3 to 4 days after treatment.  She tends to have constipation a few days after treatment.  No numbness or tingling in her hands or feet.  She had an episode of exertional dyspnea one day last week.  This resolved with her rescue inhaler.  There were no associated symptoms.  She denies fever and cough.  Objective:  Vital signs in last 24 hours:  Blood pressure (!) 130/56, pulse 80, temperature 97.8 F (36.6 C), temperature source Oral, resp. rate 18, height 5' 4.5" (1.638 m), weight 162 lb 11.2 oz (73.8 kg), SpO2 98 %.    HEENT: No thrush or ulcers. Lymphatics: No palpable cervical, supraclavicular or right axillary lymph nodes.  Question half centimeter left axillary lymph node. Resp: Lungs clear bilaterally.  No wheezes or rales. Cardio: Regular rate and rhythm. GI: Abdomen soft and nontender.  No hepatosplenomegaly. Vascular: No leg edema.  Skin: No rash. Port-A-Cath without erythema.   Lab Results:  Lab Results  Component Value Date   WBC 9.0 01/16/2018   HGB 12.6 01/16/2018   HCT 38.8 01/16/2018   MCV 97.5 01/16/2018   PLT 298 01/16/2018   NEUTROABS 7.5 (H) 01/16/2018    Imaging:  No results found.  Medications: I have reviewed the patient's current medications.  Assessment/Plan: 1. Non-Hodgkin's lymphoma, diffuse large B-cell lymphoma, CD20 positive  Ultrasound of the neck 02/18/2017-right supraclavicular adenopathy with at least 3 nodes, largest measuring 1.3 x 1.1 x 1.4 cm; 2 adjacent hypoechoic right occipital lymph nodes  Excisional biopsy right supraclavicular lymphadenopathy 19/07/1738 (Dr. Deetta Perla lymphoid proliferation. Necrotic soft tissue with associated  acute and chronic inflammation.By flow cytometry no monoclonal B-cell or phenotypically aberrant T-cell population. Special stains for microorganisms including AFB, GMS, PAS and Warthin-Starry, are negative.  CT chest 05/30/2017- multiple enlarged right axillary lymph nodes. Largest right axillary lymph node measures 2.6 x 2.2 cm. Single enlarged left axillary lymph node measuring 16 mm.  CT neck 06/03/2017-known bilateral axillary lymphadenopathy partially covered on the scan. No lymphadenopathy seen in the neck.  Biopsy right axillary lymph node 06/22/2017(Dr. Tsuei)- fibroadipose tissue with necrosis, chronic inflammation and granulation tissue  PET scan 12/06/4816- hypermetabolic bilateral axillary lymph nodes. Hypermetabolic but small right supraclavicular lymph nodes. In the right omentum there is a small focus of rim density and internal fat density which is hypermetabolic.  CT neck 10/25/2017- progressedbilateral subclavian lymphadenopathy since February, right greater than left. Conspicuous enlargement of the small lymph nodes at the right level 2 and 3 nodal stations.  CT chest/abdomen/pelvis 10/25/2017-progressive adenopathy identified within the left axilla, bilateral supraclavicular stations and left inguinal region. Persistent but improved right axillary adenopathy. Scattered indeterminate low-attenuation foci within the spleen not seen on study from 05/30/2017.  Excisional biopsy of right supraclavicular and posterior triangle lymph nodes on7/25/2019-diffuse large B-cell lymphoma, CD20 positive  PET scan 11/28/2017- new hypermetabolic lymph nodes and progressive hypermetabolism in existing lymph nodes. Hypermetabolic focus along the anterior aspect of the right scapula with adjacent soft tissue mass. No new adenopathy involving the mediastinal or hilar regions of the chest or the abdomen or pelvis. No obvious involvement of the liver or spleen.  IPI score 3  Cycle 1  CHOP/Rituxan 12/04/2017  Cycle 2 CHOP/Rituxan 12/27/2017  Cycle  3 CHOP/Rituxan 01/16/2018 2. Hypertension  3. Hypothyroid 4. Hypercholesterolemia 5. Family history significant for colon cancer, stomach cancer and brain cancer 6. Ongoing tobacco use 7. Port-A-Cath placement 12/01/2017, Interventional Radiology  Disposition: [facility] Ball appears stable.  She has completed 2 cycles of CHOP/Rituxan.  Palpable adenopathy has improved.  She is tolerating the chemotherapy well.  We reviewed the CBC from today.  Counts are adequate for treatment.  Plan to proceed with cycle 3 CHOP/Rituxan today as scheduled.  She will return for lab, follow-up and cycle 4 CHOP/Rituxan in 3 weeks.  She will contact the office in the interim with any problems.    [name] ANP/GNP-BC   01/16/2018  10:44 AM

## 2018-01-16 NOTE — Telephone Encounter (Signed)
Printed avs and calender of upcoming appointment. Per 9/24 los 

## 2018-01-16 NOTE — Patient Instructions (Signed)
[facility] Discharge Instructions for Patients Receiving Chemotherapy  Today you received the following chemotherapy agents Adriamycin, Vincristine, Cytoxan, Rituxan.  To help prevent nausea and vomiting after your treatment, we encourage you to take your nausea medication as directed.  If you develop nausea and vomiting that is not controlled by your nausea medication, call the clinic.   BELOW ARE SYMPTOMS THAT SHOULD BE REPORTED IMMEDIATELY:  *FEVER GREATER THAN 100.5 F  *CHILLS WITH OR WITHOUT FEVER  NAUSEA AND VOMITING THAT IS NOT CONTROLLED WITH YOUR NAUSEA MEDICATION  *UNUSUAL SHORTNESS OF BREATH  *UNUSUAL BRUISING OR BLEEDING  TENDERNESS IN MOUTH AND THROAT WITH OR WITHOUT PRESENCE OF ULCERS  *URINARY PROBLEMS  *BOWEL PROBLEMS  UNUSUAL RASH Items with * indicate a potential emergency and should be followed up as soon as possible.  Feel free to call the clinic should you have any questions or concerns. The clinic phone number is [phone].  Please show the CHEMO ALERT CARD at check-in to the Emergency Department and triage nurse.   

## 2018-01-17 ENCOUNTER — Ambulatory Visit: Payer: Self-pay

## 2018-01-18 ENCOUNTER — Inpatient Hospital Stay: Payer: Medicare Other

## 2018-01-18 DIAGNOSIS — Z5189 Encounter for other specified aftercare: Secondary | ICD-10-CM | POA: Diagnosis not present

## 2018-01-18 DIAGNOSIS — C8338 Diffuse large B-cell lymphoma, lymph nodes of multiple sites: Secondary | ICD-10-CM

## 2018-01-18 MED ORDER — PEGFILGRASTIM-CBQV 6 MG/0.6ML ~~LOC~~ SOSY
6.0000 mg | PREFILLED_SYRINGE | Freq: Once | SUBCUTANEOUS | Status: AC
  Administered 2018-01-18: 6 mg via SUBCUTANEOUS

## 2018-02-02 ENCOUNTER — Other Ambulatory Visit: Payer: Self-pay | Admitting: Emergency Medicine

## 2018-02-02 DIAGNOSIS — C8338 Diffuse large B-cell lymphoma, lymph nodes of multiple sites: Secondary | ICD-10-CM

## 2018-02-04 ENCOUNTER — Other Ambulatory Visit: Payer: Self-pay | Admitting: Oncology

## 2018-02-05 ENCOUNTER — Inpatient Hospital Stay (HOSPITAL_BASED_OUTPATIENT_CLINIC_OR_DEPARTMENT_OTHER): Payer: Medicare Other | Admitting: Oncology

## 2018-02-05 ENCOUNTER — Inpatient Hospital Stay: Payer: Medicare Other

## 2018-02-05 ENCOUNTER — Inpatient Hospital Stay: Payer: Medicare Other | Attending: Hematology and Oncology

## 2018-02-05 ENCOUNTER — Telehealth: Payer: Self-pay | Admitting: Oncology

## 2018-02-05 VITALS — BP 100/50 | HR 76 | Temp 98.3°F | Resp 17

## 2018-02-05 VITALS — BP 119/59 | HR 72 | Temp 98.1°F | Resp 18 | Ht 64.5 in | Wt 162.6 lb

## 2018-02-05 DIAGNOSIS — E7800 Pure hypercholesterolemia, unspecified: Secondary | ICD-10-CM

## 2018-02-05 DIAGNOSIS — Z5112 Encounter for antineoplastic immunotherapy: Secondary | ICD-10-CM | POA: Diagnosis present

## 2018-02-05 DIAGNOSIS — Z800 Family history of malignant neoplasm of digestive organs: Secondary | ICD-10-CM

## 2018-02-05 DIAGNOSIS — I10 Essential (primary) hypertension: Secondary | ICD-10-CM | POA: Diagnosis not present

## 2018-02-05 DIAGNOSIS — E039 Hypothyroidism, unspecified: Secondary | ICD-10-CM | POA: Diagnosis not present

## 2018-02-05 DIAGNOSIS — Z5189 Encounter for other specified aftercare: Secondary | ICD-10-CM | POA: Diagnosis present

## 2018-02-05 DIAGNOSIS — Z5111 Encounter for antineoplastic chemotherapy: Secondary | ICD-10-CM | POA: Diagnosis present

## 2018-02-05 DIAGNOSIS — F17210 Nicotine dependence, cigarettes, uncomplicated: Secondary | ICD-10-CM

## 2018-02-05 DIAGNOSIS — Z95828 Presence of other vascular implants and grafts: Secondary | ICD-10-CM

## 2018-02-05 DIAGNOSIS — C8338 Diffuse large B-cell lymphoma, lymph nodes of multiple sites: Secondary | ICD-10-CM | POA: Diagnosis not present

## 2018-02-05 DIAGNOSIS — M7989 Other specified soft tissue disorders: Secondary | ICD-10-CM | POA: Diagnosis not present

## 2018-02-05 DIAGNOSIS — Z808 Family history of malignant neoplasm of other organs or systems: Secondary | ICD-10-CM

## 2018-02-05 DIAGNOSIS — Z23 Encounter for immunization: Secondary | ICD-10-CM | POA: Insufficient documentation

## 2018-02-05 LAB — CBC WITH DIFFERENTIAL (CANCER CENTER ONLY)
Abs Immature Granulocytes: 0.05 10*3/uL (ref 0.00–0.07)
Basophils Absolute: 0.1 10*3/uL (ref 0.0–0.1)
Basophils Relative: 1 %
Eosinophils Absolute: 0 10*3/uL (ref 0.0–0.5)
Eosinophils Relative: 0 %
HCT: 38 % (ref 36.0–46.0)
Hemoglobin: 12.4 g/dL (ref 12.0–15.0)
Immature Granulocytes: 1 %
Lymphocytes Relative: 7 %
Lymphs Abs: 0.7 10*3/uL (ref 0.7–4.0)
MCH: 32.3 pg (ref 26.0–34.0)
MCHC: 32.6 g/dL (ref 30.0–36.0)
MCV: 99 fL (ref 80.0–100.0)
Monocytes Absolute: 0.8 10*3/uL (ref 0.1–1.0)
Monocytes Relative: 7 %
Neutro Abs: 9.2 10*3/uL — ABNORMAL HIGH (ref 1.7–7.7)
Neutrophils Relative %: 84 %
Platelet Count: 276 10*3/uL (ref 150–400)
RBC: 3.84 MIL/uL — ABNORMAL LOW (ref 3.87–5.11)
RDW: 17.2 % — ABNORMAL HIGH (ref 11.5–15.5)
WBC Count: 10.8 10*3/uL — ABNORMAL HIGH (ref 4.0–10.5)
nRBC: 0 % (ref 0.0–0.2)

## 2018-02-05 LAB — COMPREHENSIVE METABOLIC PANEL
ALT: 13 U/L (ref 0–44)
AST: 16 U/L (ref 15–41)
Albumin: 3.7 g/dL (ref 3.5–5.0)
Alkaline Phosphatase: 49 U/L (ref 38–126)
Anion gap: 7 (ref 5–15)
BUN: 10 mg/dL (ref 8–23)
CO2: 29 mmol/L (ref 22–32)
Calcium: 9.4 mg/dL (ref 8.9–10.3)
Chloride: 106 mmol/L (ref 98–111)
Creatinine, Ser: 0.5 mg/dL (ref 0.44–1.00)
GFR calc Af Amer: >60 mL/min (ref >60)
GFR calc non Af Amer: >60 mL/min (ref >60)
Glucose, Bld: 86 mg/dL (ref 70–99)
Potassium: 3.8 mmol/L (ref 3.5–5.1)
Sodium: 142 mmol/L (ref 135–145)
Total Bilirubin: 0.6 mg/dL (ref 0.3–1.2)
Total Protein: 6.3 g/dL — ABNORMAL LOW (ref 6.5–8.1)

## 2018-02-05 MED ORDER — DEXAMETHASONE SODIUM PHOSPHATE 10 MG/ML IJ SOLN
10.0000 mg | Freq: Once | INTRAMUSCULAR | Status: AC
  Administered 2018-02-05: 10 mg via INTRAVENOUS

## 2018-02-05 MED ORDER — INFLUENZA VAC SPLIT QUAD 0.5 ML IM SUSY
0.5000 mL | PREFILLED_SYRINGE | Freq: Once | INTRAMUSCULAR | Status: AC
  Administered 2018-02-05: 0.5 mL via INTRAMUSCULAR

## 2018-02-05 MED ORDER — SODIUM CHLORIDE 0.9% FLUSH
10.0000 mL | INTRAVENOUS | Status: DC | PRN
  Administered 2018-02-05: 10 mL
  Filled 2018-02-05: qty 10

## 2018-02-05 MED ORDER — DIPHENHYDRAMINE HCL 25 MG PO CAPS
50.0000 mg | ORAL_CAPSULE | Freq: Once | ORAL | Status: AC
  Administered 2018-02-05: 50 mg via ORAL

## 2018-02-05 MED ORDER — DIPHENHYDRAMINE HCL 25 MG PO CAPS
ORAL_CAPSULE | ORAL | Status: AC
  Filled 2018-02-05: qty 2

## 2018-02-05 MED ORDER — VINCRISTINE SULFATE CHEMO INJECTION 1 MG/ML
2.0000 mg | Freq: Once | INTRAVENOUS | Status: AC
  Administered 2018-02-05: 2 mg via INTRAVENOUS
  Filled 2018-02-05: qty 2

## 2018-02-05 MED ORDER — SODIUM CHLORIDE 0.9 % IV SOLN
375.0000 mg/m2 | Freq: Once | INTRAVENOUS | Status: AC
  Administered 2018-02-05: 700 mg via INTRAVENOUS
  Filled 2018-02-05: qty 50

## 2018-02-05 MED ORDER — DEXAMETHASONE SODIUM PHOSPHATE 10 MG/ML IJ SOLN
INTRAMUSCULAR | Status: AC
  Filled 2018-02-05: qty 1

## 2018-02-05 MED ORDER — PALONOSETRON HCL INJECTION 0.25 MG/5ML
INTRAVENOUS | Status: AC
  Filled 2018-02-05: qty 5

## 2018-02-05 MED ORDER — SODIUM CHLORIDE 0.9 % IV SOLN
Freq: Once | INTRAVENOUS | Status: AC
  Administered 2018-02-05: 12:00:00 via INTRAVENOUS
  Filled 2018-02-05: qty 250

## 2018-02-05 MED ORDER — INFLUENZA VAC SPLIT QUAD 0.5 ML IM SUSY
PREFILLED_SYRINGE | INTRAMUSCULAR | Status: AC
  Filled 2018-02-05: qty 0.5

## 2018-02-05 MED ORDER — DOXORUBICIN HCL CHEMO IV INJECTION 2 MG/ML
50.0000 mg/m2 | Freq: Once | INTRAVENOUS | Status: AC
  Administered 2018-02-05: 92 mg via INTRAVENOUS
  Filled 2018-02-05: qty 46

## 2018-02-05 MED ORDER — HEPARIN SOD (PORK) LOCK FLUSH 100 UNIT/ML IV SOLN
500.0000 [IU] | Freq: Once | INTRAVENOUS | Status: AC | PRN
  Administered 2018-02-05: 500 [IU]
  Filled 2018-02-05: qty 5

## 2018-02-05 MED ORDER — PALONOSETRON HCL INJECTION 0.25 MG/5ML
0.2500 mg | Freq: Once | INTRAVENOUS | Status: AC
  Administered 2018-02-05: 0.25 mg via INTRAVENOUS

## 2018-02-05 MED ORDER — SODIUM CHLORIDE 0.9 % IV SOLN
750.0000 mg/m2 | Freq: Once | INTRAVENOUS | Status: AC
  Administered 2018-02-05: 1380 mg via INTRAVENOUS
  Filled 2018-02-05: qty 69

## 2018-02-05 MED ORDER — ACETAMINOPHEN 325 MG PO TABS
ORAL_TABLET | ORAL | Status: AC
  Filled 2018-02-05: qty 2

## 2018-02-05 MED ORDER — ACETAMINOPHEN 325 MG PO TABS
650.0000 mg | ORAL_TABLET | Freq: Once | ORAL | Status: AC
  Administered 2018-02-05: 650 mg via ORAL

## 2018-02-05 NOTE — Telephone Encounter (Signed)
appts already scheduled per 10/14 los - no additional appts added.

## 2018-02-05 NOTE — Progress Notes (Signed)
[facility] OFFICE PROGRESS NOTE   Diagnosis: Non-Hodgkin's lymphoma  INTERVAL HISTORY:   [facility] Ball completed another cycle of chemotherapy on 01/16/2018.  No symptom of an allergic reaction.  No nausea/vomiting or mouth sores.  She noted transient burning at the feet.  This has resolved.  No other neuropathy symptoms.  She has noted swelling of the left hand for the past few days.  No arm swelling.  Objective:  Vital signs in last 24 hours:  Blood pressure (!) 119/59, pulse 72, temperature 98.1 F (36.7 C), temperature source Oral, resp. rate 18, height 5' 4.5" (1.638 m), weight 162 lb 9.6 oz (73.8 kg), SpO2 97 %.    HEENT: Ball or ulcers Lymphatics: No cervical or supraclavicular nodes Resp: Lungs clear bilaterally Cardio: Regular rate and rhythm GI: No hepatosplenomegaly, nontender Vascular: No leg edema, no arm or neck edema, mild soft tissue edema at the left wrist and overlying the left first MCP joint and thumb.  No erythema or palpable cord.  No venous engorgement over the left arm Skin: Palms without erythema ecchymosis at the dorsum of the left hand and wrist  Portacath/PICC-without erythema  Lab Results:  Lab Results  Component Value Date   WBC 10.8 (H) 02/05/2018   HGB 12.4 02/05/2018   HCT 38.0 02/05/2018   MCV 99.0 02/05/2018   PLT 276 02/05/2018   NEUTROABS 9.2 (H) 02/05/2018    CMP  Lab Results  Component Value Date   NA 142 02/05/2018   K 3.8 02/05/2018   CL 106 02/05/2018   CO2 29 02/05/2018   GLUCOSE 86 02/05/2018   BUN 10 02/05/2018   CREATININE 0.50 02/05/2018   CALCIUM 9.4 02/05/2018   PROT 6.3 (L) 02/05/2018   ALBUMIN 3.7 02/05/2018   AST 16 02/05/2018   ALT 13 02/05/2018   ALKPHOS 49 02/05/2018   BILITOT 0.6 02/05/2018   GFRNONAA >60 02/05/2018   GFRAA >60 02/05/2018     Medications: I have reviewed the patient's current medications.   Assessment/Plan: 1. Non-Hodgkin's lymphoma, diffuse large B-cell  lymphoma, CD20 positive  Ultrasound of the neck 02/18/2017-right supraclavicular adenopathy with at least 3 nodes, largest measuring 1.3 x 1.1 x 1.4 cm; 2 adjacent hypoechoic right occipital lymph nodes  Excisional biopsy right supraclavicular lymphadenopathy 38/04/173 (Dr. Deetta Perla lymphoid proliferation. Necrotic soft tissue with associated acute and chronic inflammation.By flow cytometry no monoclonal B-cell or phenotypically aberrant T-cell population. Special stains for microorganisms including AFB, GMS, PAS and Warthin-Starry, are negative.  CT chest 05/30/2017- multiple enlarged right axillary lymph nodes. Largest right axillary lymph node measures 2.6 x 2.2 cm. Single enlarged left axillary lymph node measuring 16 mm.  CT neck 06/03/2017-known bilateral axillary lymphadenopathy partially covered on the scan. No lymphadenopathy seen in the neck.  Biopsy right axillary lymph node 06/22/2017(Dr. Tsuei)- fibroadipose tissue with necrosis, chronic inflammation and granulation tissue  PET scan 07/19/2017-hypermetabolic bilateral axillary lymph nodes. Hypermetabolic but small right supraclavicular lymph nodes. In the right omentum there is a small focus of rim density and internal fat density which is hypermetabolic.  CT neck 10/25/2017- progressedbilateral subclavian lymphadenopathy since February, right greater than left. Conspicuous enlargement of the small lymph nodes at the right level 2 and 3 nodal stations.  CT chest/abdomen/pelvis 10/25/2017-progressive adenopathy identified within the left axilla, bilateral supraclavicular stations and left inguinal region. Persistent but improved right axillary adenopathy. Scattered indeterminate low-attenuation foci within the spleen not seen on study from 05/30/2017.  Excisional biopsy of right supraclavicular and posterior triangle  lymph nodes on7/25/2019-diffuse large B-cell lymphoma, CD20 positive  PET scan 05/03/6220-[facility]  hypermetabolic lymph nodes and progressive hypermetabolism in existing lymph nodes. Hypermetabolic focus along the anterior aspect of the right scapula with adjacent soft tissue mass. No new adenopathy involving the mediastinal or hilar regions of the chest or the abdomen or pelvis. No obvious involvement of the liver or spleen.  IPI score 3  Cycle 1 CHOP/Rituxan 12/04/2017  Cycle 2 CHOP/Rituxan 12/27/2017  Cycle 3 CHOP/Rituxan 01/16/2018   Cycle 4 CHOP/Rituxan 02/05/2018 2. Hypertension  3. Hypothyroid 4. Hypercholesterolemia 5. Family history significant for colon cancer, stomach cancer and brain cancer 6. Ongoing tobacco use 7. Port-A-Cath placement 12/01/2017, Interventional Radiology    Disposition: [facility] Ball has completed 3 cycles of CHOP/Rituxan.  She has tolerated the chemotherapy well.  She will complete cycle 4 today.  She will receive an influenza vaccine today.  The mild swelling at the left wrist and hand is likely related to an ecchymosis and soft tissue trauma.  I have a low clinical suspicion for a superficial or deep vein thrombosis.  She will elevate the arm/hand and contact us if the swelling does not improve.  [facility] Ball will undergo a restaging PET scan prior to an office visit on 02/26/2018.  Betsy Coder, MD  02/05/2018  11:27 AM

## 2018-02-06 ENCOUNTER — Ambulatory Visit: Payer: Self-pay

## 2018-02-07 ENCOUNTER — Inpatient Hospital Stay: Payer: Medicare Other

## 2018-02-07 VITALS — BP 109/57 | Temp 98.1°F | Resp 18

## 2018-02-07 DIAGNOSIS — C8338 Diffuse large B-cell lymphoma, lymph nodes of multiple sites: Secondary | ICD-10-CM

## 2018-02-07 DIAGNOSIS — Z5189 Encounter for other specified aftercare: Secondary | ICD-10-CM | POA: Diagnosis not present

## 2018-02-07 MED ORDER — PEGFILGRASTIM-CBQV 6 MG/0.6ML ~~LOC~~ SOSY
PREFILLED_SYRINGE | SUBCUTANEOUS | Status: AC
  Filled 2018-02-07: qty 0.6

## 2018-02-07 MED ORDER — PEGFILGRASTIM-CBQV 6 MG/0.6ML ~~LOC~~ SOSY
6.0000 mg | PREFILLED_SYRINGE | Freq: Once | SUBCUTANEOUS | Status: AC
  Administered 2018-02-07: 6 mg via SUBCUTANEOUS

## 2018-02-07 NOTE — Patient Instructions (Addendum)
Pegfilgrastim injection (UDENYCA) What is this medicine? PEGFILGRASTIM (PEG fil gra stim) is a long-acting granulocyte colony-stimulating factor that stimulates the growth of neutrophils, a type of white blood cell important in the body's fight against infection. It is used to reduce the incidence of fever and infection in patients with certain types of cancer who are receiving chemotherapy that affects the bone marrow, and to increase survival after being exposed to high doses of radiation. This medicine may be used for other purposes; ask your health care provider or pharmacist if you have questions. COMMON BRAND NAME(S): Neulasta,Udenyca What should I tell my health care provider before I take this medicine? They need to know if you have any of these conditions: -kidney disease -latex allergy -ongoing radiation therapy -sickle cell disease -skin reactions to acrylic adhesives (On-Body Injector only) -an unusual or allergic reaction to pegfilgrastim, filgrastim, other medicines, foods, dyes, or preservatives -pregnant or trying to get pregnant -breast-feeding How should I use this medicine? This medicine is for injection under the skin. If you get this medicine at home, you will be taught how to prepare and give the pre-filled syringe or how to use the On-body Injector. Refer to the patient Instructions for Use for detailed instructions. Use exactly as directed. Tell your healthcare provider immediately if you suspect that the On-body Injector may not have performed as intended or if you suspect the use of the On-body Injector resulted in a missed or partial dose. It is important that you put your used needles and syringes in a special sharps container. Do not put them in a trash can. If you do not have a sharps container, call your pharmacist or healthcare provider to get one. Talk to your pediatrician regarding the use of this medicine in children. While this drug may be prescribed for selected  conditions, precautions do apply. Overdosage: If you think you have taken too much of this medicine contact a poison control center or emergency room at once. NOTE: This medicine is only for you. Do not share this medicine with others. What if I miss a dose? It is important not to miss your dose. Call your doctor or health care professional if you miss your dose. If you miss a dose due to an On-body Injector failure or leakage, a new dose should be administered as soon as possible using a single prefilled syringe for manual use. What may interact with this medicine? Interactions have not been studied. Give your health care provider a list of all the medicines, herbs, non-prescription drugs, or dietary supplements you use. Also tell them if you smoke, drink alcohol, or use illegal drugs. Some items may interact with your medicine. This list may not describe all possible interactions. Give your health care provider a list of all the medicines, herbs, non-prescription drugs, or dietary supplements you use. Also tell them if you smoke, drink alcohol, or use illegal drugs. Some items may interact with your medicine. What should I watch for while using this medicine? You may need blood work done while you are taking this medicine. If you are going to need a MRI, CT scan, or other procedure, tell your doctor that you are using this medicine (On-Body Injector only). What side effects may I notice from receiving this medicine? Side effects that you should report to your doctor or health care professional as soon as possible: -allergic reactions like skin rash, itching or hives, swelling of the face, lips, or tongue -dizziness -fever -pain, redness, or irritation at   site where injected -pinpoint red spots on the skin -red or dark-brown urine -shortness of breath or breathing problems -stomach or side pain, or pain at the shoulder -swelling -tiredness -trouble passing urine or change in the amount of  urine Side effects that usually do not require medical attention (report to your doctor or health care professional if they continue or are bothersome): -bone pain -muscle pain This list may not describe all possible side effects. Call your doctor for medical advice about side effects. You may report side effects to FDA at 1-800-FDA-1088. Where should I keep my medicine? Keep out of the reach of children. Store pre-filled syringes in a refrigerator between 2 and 8 degrees C (36 and 46 degrees F). Do not freeze. Keep in carton to protect from light. Throw away this medicine if it is left out of the refrigerator for more than 48 hours. Throw away any unused medicine after the expiration date. NOTE: This sheet is a summary. It may not cover all possible information. If you have questions about this medicine, talk to your doctor, pharmacist, or health care provider.  2018 [employer]vier/Gold Standard (2016-04-07 12:58:03)  

## 2018-02-19 ENCOUNTER — Encounter (HOSPITAL_COMMUNITY)
Admission: RE | Admit: 2018-02-19 | Discharge: 2018-02-19 | Disposition: A | Discharge status: Home / Self Care | Payer: Medicare Other | Source: Ambulatory Visit | Attending: Nurse Practitioner | Admitting: Nurse Practitioner

## 2018-02-19 DIAGNOSIS — C8338 Diffuse large B-cell lymphoma, lymph nodes of multiple sites: Secondary | ICD-10-CM | POA: Diagnosis present

## 2018-02-19 LAB — GLUCOSE, CAPILLARY: Glucose-Capillary: 92 mg/dL (ref 70–99)

## 2018-02-19 MED ORDER — FLUDEOXYGLUCOSE F - 18 (FDG) INJECTION
8.0900 | Freq: Once | INTRAVENOUS | Status: AC | PRN
  Administered 2018-02-19: 8.09 via INTRAVENOUS

## 2018-02-20 ENCOUNTER — Telehealth: Payer: Self-pay | Admitting: *Deleted

## 2018-02-20 NOTE — Telephone Encounter (Signed)
Notified patient that her PET showed "complete response" to chemo per Dr. Benay Spice. F/U as scheduled.

## 2018-02-20 NOTE — Telephone Encounter (Signed)
-----   Message from Ladell Pier, MD sent at 02/19/2018  6:35 PM EDT ----- Please call patient, PET shows complete response to therapy, f/u as scheduled

## 2018-02-25 ENCOUNTER — Other Ambulatory Visit: Payer: Self-pay | Admitting: Oncology

## 2018-02-26 ENCOUNTER — Inpatient Hospital Stay: Payer: Medicare Other

## 2018-02-26 ENCOUNTER — Inpatient Hospital Stay: Payer: Medicare Other | Attending: Hematology and Oncology

## 2018-02-26 ENCOUNTER — Ambulatory Visit: Payer: Self-pay | Admitting: Nurse Practitioner

## 2018-02-26 ENCOUNTER — Other Ambulatory Visit: Payer: Self-pay

## 2018-02-26 ENCOUNTER — Ambulatory Visit: Payer: Self-pay

## 2018-02-26 ENCOUNTER — Ambulatory Visit (HOSPITAL_COMMUNITY)
Admission: RE | Admit: 2018-02-26 | Discharge: 2018-02-26 | Disposition: A | Discharge status: Home / Self Care | Payer: Medicare Other | Source: Ambulatory Visit | Attending: Oncology | Admitting: Oncology

## 2018-02-26 ENCOUNTER — Inpatient Hospital Stay (HOSPITAL_BASED_OUTPATIENT_CLINIC_OR_DEPARTMENT_OTHER): Payer: Medicare Other | Admitting: Oncology

## 2018-02-26 VITALS — BP 116/70 | HR 76 | Temp 98.2°F | Resp 18 | Ht 64.5 in | Wt 159.7 lb

## 2018-02-26 DIAGNOSIS — Z808 Family history of malignant neoplasm of other organs or systems: Secondary | ICD-10-CM

## 2018-02-26 DIAGNOSIS — R600 Localized edema: Secondary | ICD-10-CM | POA: Diagnosis not present

## 2018-02-26 DIAGNOSIS — Z5111 Encounter for antineoplastic chemotherapy: Secondary | ICD-10-CM | POA: Insufficient documentation

## 2018-02-26 DIAGNOSIS — Z800 Family history of malignant neoplasm of digestive organs: Secondary | ICD-10-CM | POA: Insufficient documentation

## 2018-02-26 DIAGNOSIS — C8338 Diffuse large B-cell lymphoma, lymph nodes of multiple sites: Secondary | ICD-10-CM | POA: Diagnosis present

## 2018-02-26 DIAGNOSIS — Z5189 Encounter for other specified aftercare: Secondary | ICD-10-CM | POA: Insufficient documentation

## 2018-02-26 DIAGNOSIS — E7800 Pure hypercholesterolemia, unspecified: Secondary | ICD-10-CM

## 2018-02-26 DIAGNOSIS — M7989 Other specified soft tissue disorders: Secondary | ICD-10-CM | POA: Diagnosis not present

## 2018-02-26 DIAGNOSIS — E039 Hypothyroidism, unspecified: Secondary | ICD-10-CM | POA: Insufficient documentation

## 2018-02-26 DIAGNOSIS — F17210 Nicotine dependence, cigarettes, uncomplicated: Secondary | ICD-10-CM

## 2018-02-26 DIAGNOSIS — K5900 Constipation, unspecified: Secondary | ICD-10-CM | POA: Diagnosis not present

## 2018-02-26 DIAGNOSIS — I10 Essential (primary) hypertension: Secondary | ICD-10-CM | POA: Insufficient documentation

## 2018-02-26 DIAGNOSIS — Z5112 Encounter for antineoplastic immunotherapy: Secondary | ICD-10-CM | POA: Diagnosis present

## 2018-02-26 DIAGNOSIS — R110 Nausea: Secondary | ICD-10-CM | POA: Insufficient documentation

## 2018-02-26 LAB — CBC WITH DIFFERENTIAL (CANCER CENTER ONLY)
Abs Immature Granulocytes: 0.04 10*3/uL (ref 0.00–0.07)
Basophils Absolute: 0.1 10*3/uL (ref 0.0–0.1)
Basophils Relative: 1 %
Eosinophils Absolute: 0 10*3/uL (ref 0.0–0.5)
Eosinophils Relative: 0 %
HCT: 40.6 % (ref 36.0–46.0)
Hemoglobin: 12.9 g/dL (ref 12.0–15.0)
Immature Granulocytes: 1 %
Lymphocytes Relative: 8 %
Lymphs Abs: 0.7 10*3/uL (ref 0.7–4.0)
MCH: 32.5 pg (ref 26.0–34.0)
MCHC: 31.8 g/dL (ref 30.0–36.0)
MCV: 102.3 fL — ABNORMAL HIGH (ref 80.0–100.0)
Monocytes Absolute: 0.7 10*3/uL (ref 0.1–1.0)
Monocytes Relative: 8 %
Neutro Abs: 7.3 10*3/uL (ref 1.7–7.7)
Neutrophils Relative %: 82 %
Platelet Count: 283 10*3/uL (ref 150–400)
RBC: 3.97 MIL/uL (ref 3.87–5.11)
RDW: 16.1 % — ABNORMAL HIGH (ref 11.5–15.5)
WBC Count: 8.8 10*3/uL (ref 4.0–10.5)
nRBC: 0 % (ref 0.0–0.2)

## 2018-02-26 LAB — CMP (CANCER CENTER ONLY)
ALT: 13 U/L (ref 0–44)
AST: 15 U/L (ref 15–41)
Albumin: 3.7 g/dL (ref 3.5–5.0)
Alkaline Phosphatase: 56 U/L (ref 38–126)
Anion gap: 8 (ref 5–15)
BUN: 7 mg/dL — ABNORMAL LOW (ref 8–23)
CO2: 28 mmol/L (ref 22–32)
Calcium: 9.5 mg/dL (ref 8.9–10.3)
Chloride: 106 mmol/L (ref 98–111)
Creatinine: 0.67 mg/dL (ref 0.44–1.00)
GFR, Est AFR Am: >60 mL/min (ref >60)
GFR, Estimated: >60 mL/min (ref >60)
Glucose, Bld: 88 mg/dL (ref 70–99)
Potassium: 4.2 mmol/L (ref 3.5–5.1)
Sodium: 142 mmol/L (ref 135–145)
Total Bilirubin: 0.5 mg/dL (ref 0.3–1.2)
Total Protein: 6.5 g/dL (ref 6.5–8.1)

## 2018-02-26 NOTE — Progress Notes (Signed)
[facility] OFFICE PROGRESS NOTE   Diagnosis: Non-Hodgkin's lymphoma  INTERVAL HISTORY:   Ms. Perdew returns as scheduled.  She completed another cycle of chemotherapy on 02/05/2018.  No nausea/vomiting or neuropathy symptoms.  She reports mild mouth soreness.  She feels well.  She has noted persistent swelling in the left arm and wrist..  Objective:  Vital signs in last 24 hours:  Blood pressure 116/70, pulse 76, temperature 98.2 F (36.8 C), temperature source Oral, resp. rate 18, height 5' 4.5" (1.638 m), weight 159 lb 11.2 oz (72.4 kg), SpO2 96 %.    HEENT: Neck without mass, no thrush or ulcers Lymphatics: No cervical or supraclavicular nodes Resp: Distant breath sounds, no respiratory distress Cardio: Regular rate and rhythm GI: No hepatosplenomegaly, nontender Vascular: No leg edema, there is mild edema throughout the left arm extending to the wrist.  No erythema or palpable cord.  Mild venous engorgement at the bilateral upper anterior chest   Portacath/PICC-without erythema  Lab Results:  Lab Results  Component Value Date   WBC 8.8 02/26/2018   HGB 12.9 02/26/2018   HCT 40.6 02/26/2018   MCV 102.3 (H) 02/26/2018   PLT 283 02/26/2018   NEUTROABS 7.3 02/26/2018    CMP  Lab Results  Component Value Date   NA 142 02/26/2018   K 4.2 02/26/2018   CL 106 02/26/2018   CO2 28 02/26/2018   GLUCOSE 88 02/26/2018   BUN 7 (L) 02/26/2018   CREATININE 0.67 02/26/2018   CALCIUM 9.5 02/26/2018   PROT 6.5 02/26/2018   ALBUMIN 3.7 02/26/2018   AST 15 02/26/2018   ALT 13 02/26/2018   ALKPHOS 56 02/26/2018   BILITOT 0.5 02/26/2018   GFRNONAA >60 02/26/2018   GFRAA >60 02/26/2018      Imaging: PET images reviewed Medications: I have reviewed the patient's current medications.   Assessment/Plan: 1. Non-Hodgkin's lymphoma, diffuse large B-cell lymphoma, CD20 positive  Ultrasound of the neck 02/18/2017-right supraclavicular adenopathy with at  least 3 nodes, largest measuring 1.3 x 1.1 x 1.4 cm; 2 adjacent hypoechoic right occipital lymph nodes  Excisional biopsy right supraclavicular lymphadenopathy 96/05/9526 (Dr. Deetta Perla lymphoid proliferation. Necrotic soft tissue with associated acute and chronic inflammation.By flow cytometry no monoclonal B-cell or phenotypically aberrant T-cell population. Special stains for microorganisms including AFB, GMS, PAS and Warthin-Starry, are negative.  CT chest 05/30/2017- multiple enlarged right axillary lymph nodes. Largest right axillary lymph node measures 2.6 x 2.2 cm. Single enlarged left axillary lymph node measuring 16 mm.  CT neck 06/03/2017-known bilateral axillary lymphadenopathy partially covered on the scan. No lymphadenopathy seen in the neck.  Biopsy right axillary lymph node 06/22/2017(Dr. Tsuei)- fibroadipose tissue with necrosis, chronic inflammation and granulation tissue  PET scan 07/19/2017-hypermetabolic bilateral axillary lymph nodes. Hypermetabolic but small right supraclavicular lymph nodes. In the right omentum there is a small focus of rim density and internal fat density which is hypermetabolic.  CT neck 10/25/2017- progressedbilateral subclavian lymphadenopathy since February, right greater than left. Conspicuous enlargement of the small lymph nodes at the right level 2 and 3 nodal stations.  CT chest/abdomen/pelvis 10/25/2017-progressive adenopathy identified within the left axilla, bilateral supraclavicular stations and left inguinal region. Persistent but improved right axillary adenopathy. Scattered indeterminate low-attenuation foci within the spleen not seen on study from 05/30/2017.  Excisional biopsy of right supraclavicular and posterior triangle lymph nodes on7/25/2019-diffuse large B-cell lymphoma, CD20 positive  PET scan 07/25/3242-[facility] hypermetabolic lymph nodes and progressive hypermetabolism in existing lymph nodes. Hypermetabolic focus along  the anterior aspect of the right scapula with adjacent soft tissue mass. No new adenopathy involving the mediastinal or hilar regions of the chest or the abdomen or pelvis. No obvious involvement of the liver or spleen.  IPI score 3  Cycle 1 CHOP/Rituxan 12/04/2017  Cycle 2 CHOP/Rituxan 12/27/2017  Cycle 3 CHOP/Rituxan 01/16/2018   Cycle 4 CHOP/Rituxan 02/05/2018  PET 02/19/2018- resolution of previously noted hypermetabolic adenopathy, no residual hypermetabolic osseous lesions  Cycle 5 CHOP/rituximab 02/27/2018 2. Hypertension  3. Hypothyroid 4. Hypercholesterolemia 5. Family history significant for colon cancer, stomach cancer and brain cancer 6. Ongoing tobacco use 7. Port-A-Cath placement 12/01/2017, Interventional Radiology     Disposition: Ms. Strubel has completed 4 cycles of CHOP-rituximab.  She has tolerated the chemotherapy well.  She appears to be in complete clinical remission.  I reviewed the PET images with her today.  The plan is to complete 2 more cycles of CHOP-rituximab.  She has edema of the left arm.  No palpable cord.  Her Port-A-Cath is on the right side.  She will be referred for a Doppler of the left upper extremity today.  She will be placed on anticoagulation therapy if a DVT is confirmed.  Ms. Stoffer will return for chemotherapy tomorrow.  25 minutes were spent with the patient today.  The majority of the time was used for counseling and coordination of care.  Betsy Coder, MD  02/26/2018  11:07 AM

## 2018-02-26 NOTE — Progress Notes (Signed)
*  Preliminary Results* Left upper extremity venous duplex completed. Left upper extremity is negative for deep and superficial vein thrombosis.  02/26/2018 2:13 PM  Maudry Mayhew, MHA, RVT, RDCS, RDMS

## 2018-02-27 ENCOUNTER — Inpatient Hospital Stay: Payer: Medicare Other

## 2018-02-27 VITALS — BP 110/52 | HR 81 | Temp 98.0°F | Resp 16

## 2018-02-27 DIAGNOSIS — Z5189 Encounter for other specified aftercare: Secondary | ICD-10-CM | POA: Diagnosis not present

## 2018-02-27 DIAGNOSIS — C8338 Diffuse large B-cell lymphoma, lymph nodes of multiple sites: Secondary | ICD-10-CM

## 2018-02-27 MED ORDER — PALONOSETRON HCL INJECTION 0.25 MG/5ML
INTRAVENOUS | Status: AC
  Filled 2018-02-27: qty 5

## 2018-02-27 MED ORDER — HEPARIN SOD (PORK) LOCK FLUSH 100 UNIT/ML IV SOLN
500.0000 [IU] | Freq: Once | INTRAVENOUS | Status: AC | PRN
  Administered 2018-02-27: 500 [IU]
  Filled 2018-02-27: qty 5

## 2018-02-27 MED ORDER — DIPHENHYDRAMINE HCL 25 MG PO CAPS
50.0000 mg | ORAL_CAPSULE | Freq: Once | ORAL | Status: AC
  Administered 2018-02-27: 50 mg via ORAL

## 2018-02-27 MED ORDER — PALONOSETRON HCL INJECTION 0.25 MG/5ML
0.2500 mg | Freq: Once | INTRAVENOUS | Status: AC
  Administered 2018-02-27: 0.25 mg via INTRAVENOUS

## 2018-02-27 MED ORDER — DEXAMETHASONE SODIUM PHOSPHATE 10 MG/ML IJ SOLN
10.0000 mg | Freq: Once | INTRAMUSCULAR | Status: AC
  Administered 2018-02-27: 10 mg via INTRAVENOUS

## 2018-02-27 MED ORDER — SODIUM CHLORIDE 0.9 % IV SOLN
750.0000 mg/m2 | Freq: Once | INTRAVENOUS | Status: AC
  Administered 2018-02-27: 1380 mg via INTRAVENOUS
  Filled 2018-02-27: qty 69

## 2018-02-27 MED ORDER — SODIUM CHLORIDE 0.9 % IV SOLN
375.0000 mg/m2 | Freq: Once | INTRAVENOUS | Status: AC
  Administered 2018-02-27: 700 mg via INTRAVENOUS
  Filled 2018-02-27: qty 50

## 2018-02-27 MED ORDER — VINCRISTINE SULFATE CHEMO INJECTION 1 MG/ML
2.0000 mg | Freq: Once | INTRAVENOUS | Status: AC
  Administered 2018-02-27: 2 mg via INTRAVENOUS
  Filled 2018-02-27: qty 2

## 2018-02-27 MED ORDER — SODIUM CHLORIDE 0.9% FLUSH
10.0000 mL | INTRAVENOUS | Status: DC | PRN
  Administered 2018-02-27: 10 mL
  Filled 2018-02-27: qty 10

## 2018-02-27 MED ORDER — ACETAMINOPHEN 325 MG PO TABS
ORAL_TABLET | ORAL | Status: AC
  Filled 2018-02-27: qty 2

## 2018-02-27 MED ORDER — SODIUM CHLORIDE 0.9 % IV SOLN
Freq: Once | INTRAVENOUS | Status: AC
  Administered 2018-02-27: 08:00:00 via INTRAVENOUS
  Filled 2018-02-27: qty 250

## 2018-02-27 MED ORDER — DIPHENHYDRAMINE HCL 25 MG PO CAPS
ORAL_CAPSULE | ORAL | Status: AC
  Filled 2018-02-27: qty 2

## 2018-02-27 MED ORDER — ACETAMINOPHEN 325 MG PO TABS
650.0000 mg | ORAL_TABLET | Freq: Once | ORAL | Status: AC
  Administered 2018-02-27: 650 mg via ORAL

## 2018-02-27 MED ORDER — DEXAMETHASONE SODIUM PHOSPHATE 10 MG/ML IJ SOLN
INTRAMUSCULAR | Status: AC
  Filled 2018-02-27: qty 1

## 2018-02-27 MED ORDER — DOXORUBICIN HCL CHEMO IV INJECTION 2 MG/ML
50.0000 mg/m2 | Freq: Once | INTRAVENOUS | Status: AC
  Administered 2018-02-27: 92 mg via INTRAVENOUS
  Filled 2018-02-27: qty 46

## 2018-02-27 NOTE — Patient Instructions (Signed)
[facility] Discharge Instructions for Patients Receiving Chemotherapy  Today you received the following chemotherapy agents Adriamycin, Vincristine, Cytoxan, Rituxan.  To help prevent nausea and vomiting after your treatment, we encourage you to take your nausea medication as directed.  If you develop nausea and vomiting that is not controlled by your nausea medication, call the clinic.   BELOW ARE SYMPTOMS THAT SHOULD BE REPORTED IMMEDIATELY:  *FEVER GREATER THAN 100.5 F  *CHILLS WITH OR WITHOUT FEVER  NAUSEA AND VOMITING THAT IS NOT CONTROLLED WITH YOUR NAUSEA MEDICATION  *UNUSUAL SHORTNESS OF BREATH  *UNUSUAL BRUISING OR BLEEDING  TENDERNESS IN MOUTH AND THROAT WITH OR WITHOUT PRESENCE OF ULCERS  *URINARY PROBLEMS  *BOWEL PROBLEMS  UNUSUAL RASH Items with * indicate a potential emergency and should be followed up as soon as possible.  Feel free to call the clinic should you have any questions or concerns. The clinic phone number is [phone].  Please show the CHEMO ALERT CARD at check-in to the Emergency Department and triage nurse.   

## 2018-02-28 ENCOUNTER — Inpatient Hospital Stay: Payer: Medicare Other

## 2018-02-28 DIAGNOSIS — Z5189 Encounter for other specified aftercare: Secondary | ICD-10-CM | POA: Diagnosis not present

## 2018-02-28 DIAGNOSIS — C8338 Diffuse large B-cell lymphoma, lymph nodes of multiple sites: Secondary | ICD-10-CM

## 2018-02-28 MED ORDER — PEGFILGRASTIM-CBQV 6 MG/0.6ML ~~LOC~~ SOSY
6.0000 mg | PREFILLED_SYRINGE | Freq: Once | SUBCUTANEOUS | Status: AC
  Administered 2018-02-28: 6 mg via SUBCUTANEOUS

## 2018-02-28 MED ORDER — PEGFILGRASTIM-CBQV 6 MG/0.6ML ~~LOC~~ SOSY
PREFILLED_SYRINGE | SUBCUTANEOUS | Status: AC
  Filled 2018-02-28: qty 0.6

## 2018-02-28 NOTE — Patient Instructions (Signed)
Pegfilgrastim injection (UDENYCA) What is this medicine? PEGFILGRASTIM (PEG fil gra stim) is a long-acting granulocyte colony-stimulating factor that stimulates the growth of neutrophils, a type of white blood cell important in the body's fight against infection. It is used to reduce the incidence of fever and infection in patients with certain types of cancer who are receiving chemotherapy that affects the bone marrow, and to increase survival after being exposed to high doses of radiation. This medicine may be used for other purposes; ask your health care provider or pharmacist if you have questions. COMMON BRAND NAME(S): Neulasta,Udenyca What should I tell my health care provider before I take this medicine? They need to know if you have any of these conditions: -kidney disease -latex allergy -ongoing radiation therapy -sickle cell disease -skin reactions to acrylic adhesives (On-Body Injector only) -an unusual or allergic reaction to pegfilgrastim, filgrastim, other medicines, foods, dyes, or preservatives -pregnant or trying to get pregnant -breast-feeding How should I use this medicine? This medicine is for injection under the skin. If you get this medicine at home, you will be taught how to prepare and give the pre-filled syringe or how to use the On-body Injector. Refer to the patient Instructions for Use for detailed instructions. Use exactly as directed. Tell your healthcare provider immediately if you suspect that the On-body Injector may not have performed as intended or if you suspect the use of the On-body Injector resulted in a missed or partial dose. It is important that you put your used needles and syringes in a special sharps container. Do not put them in a trash can. If you do not have a sharps container, call your pharmacist or healthcare provider to get one. Talk to your pediatrician regarding the use of this medicine in children. While this drug may be prescribed for selected  conditions, precautions do apply. Overdosage: If you think you have taken too much of this medicine contact a poison control center or emergency room at once. NOTE: This medicine is only for you. Do not share this medicine with others. What if I miss a dose? It is important not to miss your dose. Call your doctor or health care professional if you miss your dose. If you miss a dose due to an On-body Injector failure or leakage, a new dose should be administered as soon as possible using a single prefilled syringe for manual use. What may interact with this medicine? Interactions have not been studied. Give your health care provider a list of all the medicines, herbs, non-prescription drugs, or dietary supplements you use. Also tell them if you smoke, drink alcohol, or use illegal drugs. Some items may interact with your medicine. This list may not describe all possible interactions. Give your health care provider a list of all the medicines, herbs, non-prescription drugs, or dietary supplements you use. Also tell them if you smoke, drink alcohol, or use illegal drugs. Some items may interact with your medicine. What should I watch for while using this medicine? You may need blood work done while you are taking this medicine. If you are going to need a MRI, CT scan, or other procedure, tell your doctor that you are using this medicine (On-Body Injector only). What side effects may I notice from receiving this medicine? Side effects that you should report to your doctor or health care professional as soon as possible: -allergic reactions like skin rash, itching or hives, swelling of the face, lips, or tongue -dizziness -fever -pain, redness, or irritation at   site where injected -pinpoint red spots on the skin -red or dark-brown urine -shortness of breath or breathing problems -stomach or side pain, or pain at the shoulder -swelling -tiredness -trouble passing urine or change in the amount of  urine Side effects that usually do not require medical attention (report to your doctor or health care professional if they continue or are bothersome): -bone pain -muscle pain This list may not describe all possible side effects. Call your doctor for medical advice about side effects. You may report side effects to FDA at 1-800-FDA-1088. Where should I keep my medicine? Keep out of the reach of children. Store pre-filled syringes in a refrigerator between 2 and 8 degrees C (36 and 46 degrees F). Do not freeze. Keep in carton to protect from light. Throw away this medicine if it is left out of the refrigerator for more than 48 hours. Throw away any unused medicine after the expiration date. NOTE: This sheet is a summary. It may not cover all possible information. If you have questions about this medicine, talk to your doctor, pharmacist, or health care provider.  2018 [employer]vier/Gold Standard (2016-04-07 12:58:03)  

## 2018-03-08 ENCOUNTER — Ambulatory Visit (INDEPENDENT_AMBULATORY_CARE_PROVIDER_SITE_OTHER): Payer: Medicare Other | Admitting: Psychiatry

## 2018-03-08 ENCOUNTER — Encounter (HOSPITAL_COMMUNITY): Payer: Self-pay | Admitting: Psychiatry

## 2018-03-08 DIAGNOSIS — F411 Generalized anxiety disorder: Secondary | ICD-10-CM | POA: Diagnosis not present

## 2018-03-08 DIAGNOSIS — F331 Major depressive disorder, recurrent, moderate: Secondary | ICD-10-CM

## 2018-03-08 DIAGNOSIS — F99 Mental disorder, not otherwise specified: Secondary | ICD-10-CM

## 2018-03-08 DIAGNOSIS — F1021 Alcohol dependence, in remission: Secondary | ICD-10-CM

## 2018-03-08 DIAGNOSIS — T65224A Toxic effect of tobacco cigarettes, undetermined, initial encounter: Secondary | ICD-10-CM

## 2018-03-08 DIAGNOSIS — F1020 Alcohol dependence, uncomplicated: Secondary | ICD-10-CM

## 2018-03-08 DIAGNOSIS — F17210 Nicotine dependence, cigarettes, uncomplicated: Secondary | ICD-10-CM

## 2018-03-08 DIAGNOSIS — F5105 Insomnia due to other mental disorder: Secondary | ICD-10-CM

## 2018-03-08 MED ORDER — VENLAFAXINE HCL ER 75 MG PO CP24: 75.0000 mg | ORAL_CAPSULE | Freq: Every day | ORAL | 0 refills | Status: DC

## 2018-03-08 MED ORDER — BUSPIRONE HCL 10 MG PO TABS: 10.0000 mg | ORAL_TABLET | Freq: Three times a day (TID) | ORAL | 0 refills | Status: DC

## 2018-03-08 MED ORDER — BUPROPION HCL ER (SR) 150 MG PO TB12: 150.0000 mg | ORAL_TABLET | Freq: Two times a day (BID) | ORAL | 0 refills | Status: DC

## 2018-03-08 MED ORDER — TRAZODONE HCL 100 MG PO TABS: 200.0000 mg | ORAL_TABLET | Freq: Every day | ORAL | 0 refills | Status: DC

## 2018-03-08 NOTE — Progress Notes (Signed)
BH MD/PA/NP OP Progress Note  03/08/2018 8:40 AM Kendra Ball  MRN:  [id number]  Chief Complaint:  Chief Complaint    Follow-up     HPI: Patient reports that she was diagnosed with lymphoma and has been going through chemotherapy.  She has 1 more treatment left around Thanksgiving.  She had a PET scan done last week which shows that she is in remission.  Kendra Ball states that the experience was not as bad as what she thought it was going to be.  She did have physical side effects that are expected with chemo but was able to tolerate them.  She is grateful that she had a good doctor and supports.  Kendra Ball tells me that she is learned she needs to live in the moment and now uses any mild increase in energy to socialize or do some chores or run some errands.  She does not want to miss a moment of "living my life".  She states through this whole process she has not had any depression symptoms.  She has been as active as possible and wishes that she could do more.  She denies isolation, hopelessness, worthlessness.  She denies suicidal and homicidal ideations.  Her sleep remains good with trazodone.  She has some mild anxiety but states that the BuSpar is really helping.  Kendra Ball has not been to an [facility] in 4 months but does continue to talk with her sponsor frequently.  Patient has not been drinking any alcohol and has not felt the need to.  She really feels that she is learned good coping skills and is grateful that she has.  She is taking the Wellbutrin, Effexor, BuSpar and trazodone as prescribed and denies side effects   Visit Diagnosis:    ICD-10-CM   1. GAD (generalized anxiety disorder) F41.1 busPIRone (BUSPAR) 10 MG tablet    venlafaxine XR (EFFEXOR-XR) 75 MG 24 hr capsule  2. Insomnia due to other mental disorder F51.05 traZODone (DESYREL) 100 MG tablet   F99   3. Toxic effect of tobacco cigarette, undetermined intent, initial encounter T65.224A buPROPion (WELLBUTRIN SR) 150 MG 12 hr  tablet  4. MDD (major depressive disorder), recurrent episode, moderate (HCC) F33.1 busPIRone (BUSPAR) 10 MG tablet    traZODone (DESYREL) 100 MG tablet    venlafaxine XR (EFFEXOR-XR) 75 MG 24 hr capsule  5. Alcohol use disorder, moderate, dependence (HCC) F10.20 busPIRone (BUSPAR) 10 MG tablet    traZODone (DESYREL) 100 MG tablet      Past Psychiatric History:  Anxiety: Yes Bipolar Disorder: No Depression: Yes Mania: No Psychosis: No Schizophrenia: No Personality Disorder: No Hospitalization for psychiatric illness: No History of Electroconvulsive Shock Therapy: No Prior Suicide Attempts: No   Past Medical History:  Past Medical History:  Diagnosis Date  . Adenopathy    right supraclavicular  . Anxiety   . Arthritis   . COPD (chronic obstructive pulmonary disease) ([place])   . DEPRESSION 08/26/2008  . Hx of colonic polyps    First noted on  colonoscopy 2012  . Hypercholesteremia   . HYPERTENSION 08/26/2008  . HYPOTHYROIDISM 08/26/2008  . Obesity   . Osteoporosis   . Substance abuse [facility])    inpatient and outpatient tx for substance abuse  . TOBACCO USER 12/25/2009    Past Surgical History:  Procedure Laterality Date  . AXILLARY LYMPH NODE BIOPSY Right 06/22/2017   Procedure: RIGHT AXILLARY LYMPH NODE BIOPSY;  Surgeon: [name], MD;  Location: [facility];  Service: General;  Laterality: Right;  . [place] SURGERY  2006  . CESAREAN SECTION    . COLON RESECTION N/A 05/09/2014   Procedure: LAPAROSCOPIC HAND-ASSISTED EXTENDED RIGHT COLECTOMY, LAPAROSCOPIC LYSIS OF ADHESIONS, SPLENIC FLEXURE MOBILIZATION.;  Surgeon: [name], MD;  Location: [facility];  Service: General;  Laterality: N/A;  . DILATION AND CURETTAGE OF UTERUS     x2  . FOREARM FRACTURE SURGERY     right  . GASTRIC BYPASS  2006  . IR IMAGING GUIDED PORT INSERTION  12/01/2017  . MASS EXCISION Right 02/24/2017   Procedure: EXCISIONAL BIOPSY RIGHT SUPRA-CLAVICULAR NODE;  Surgeon: [name], MD;  Location: [facility];  Service: ENT;  Laterality: Right;  . TONSILLECTOMY      Family Psychiatric History: Family History  Problem Relation Age of Onset  . Cancer Mother        colon  . Dementia Mother   . Depression Mother   . Heart disease Father 25  . Heart disease Sister 17       stents, pacemaker  . Depression Sister   . Heart disease Brother 4       CABG, MI  . Depression Brother   . Obesity Daughter   . Suicidality Daughter   . Bipolar disorder Daughter   . Obesity Son   . Mental illness Daughter        bipolar  . Obesity Daughter   . Depression Sister   . Dementia Paternal Aunt   . Alcohol abuse Maternal Uncle   . Colon cancer Maternal Aunt   . Stomach cancer Maternal Grandfather     Social History:  Social History   Socioeconomic History  . Marital status: Married    Spouse name: Not on file  . Number of children: 3  . Years of education: Not on file  . Highest education level: Not on file  Occupational History  . Occupation: RETIRED    Employer: [employer]  Social Needs  . Financial resource strain: Not on file  . Food insecurity:    Worry: Not on file    Inability: Not on file  . Transportation needs:    Medical: Not on file    Non-medical: Not on file  Tobacco Use  . Smoking status: Current Every Day Smoker    Packs/day: 1.00    Years: 50.00    Pack years: 50.00    Types: Cigarettes  . Smokeless tobacco: Never Used  Substance and Sexual Activity  . Alcohol use: No    Comment: quit on Aug 27, 2015  . Drug use: No    Frequency: 20.0 times per week    Comment: last used May 2017  . Sexual activity: Not Currently    Partners: Male    Birth control/protection: Post-menopausal  Lifestyle  . Physical activity:    Days per week: Not on file    Minutes per session: Not on file  . Stress: Not on file  Relationships  . Social connections:    Talks on phone: Not on file    Gets together: Not on file    Attends religious service: Not  on file    Active member of club or organization: Not on file    Attends meetings of clubs or organizations: Not on file    Relationship status: Not on file  Other Topics Concern  . Not on file  Social History Narrative   Kendra Ball lives in [place] with her husband. She has 3 grown  children and several grand children. She has joint custody of 3 of her grand children as her daughter has bipolar disorder and has needed assistance with children in past.    Allergies:  Allergies  Allergen Reactions  . Morphine Sulfate Swelling and Other (See Comments)    Swelling around injection area    Metabolic Disorder Labs: Lab Results  Component Value Date   HGBA1C 5.2 12/06/2016   MPG 111 05/10/2014   No results found for: PROLACTIN Lab Results  Component Value Date   CHOL 164 12/06/2016   TRIG 95.0 12/06/2016   HDL 70.50 12/06/2016   CHOLHDL 2 12/06/2016   VLDL 19.0 12/06/2016   LDLCALC 74 12/06/2016   LDLCALC 82 10/21/2015   Lab Results  Component Value Date   TSH 0.65 12/06/2016   TSH 2.24 01/22/2016    Therapeutic Level Labs: No results found for: LITHIUM No results found for: VALPROATE No components found for:  CBMZ  Current Medications: Current Outpatient Medications  Medication Sig Dispense Refill  . albuterol (PROVENTIL HFA;VENTOLIN HFA) 108 (90 Base) MCG/ACT inhaler Inhale 2 puffs into the lungs every 6 (six) hours as needed for wheezing or shortness of breath. 1 Inhaler 2  . aspirin 81 MG tablet Take 81 mg by mouth daily.    . budesonide-formoterol (SYMBICORT) 160-4.5 MCG/ACT inhaler Inhale 2 puffs into the lungs 2 (two) times daily. 1 Inhaler 6  . buPROPion (WELLBUTRIN SR) 150 MG 12 hr tablet Take 1 tablet (150 mg total) by mouth 2 (two) times daily. 180 tablet 0  . busPIRone (BUSPAR) 10 MG tablet Take 1 tablet (10 mg total) by mouth 3 (three) times daily. 270 tablet 0  . Cyanocobalamin (VITAMIN B-12 PO) Take 1,000 mg by mouth daily.     . ferrous fumarate  (HEMOCYTE - 106 MG FE) 325 (106 FE) MG TABS Take 1 tablet by mouth daily. Reported on 10/06/2015    . levothyroxine (SYNTHROID, LEVOTHROID) 50 MCG tablet TAKE 2 TABLETS (100 MCG TOTAL) BY MOUTH DAILY BEFORE BREAKFAST.  3  . lidocaine-prilocaine (EMLA) cream Apply to port 1 hour before use. Do not rub in. Cover with plastic. 30 g 1  . lisinopril (PRINIVIL,ZESTRIL) 5 MG tablet Take 1 tablet (5 mg total) by mouth daily. 90 tablet 1  . Magnesium 250 MG TABS Take 250 mg by mouth 2 (two) times daily.     . predniSONE (DELTASONE) 20 MG tablet Take 4 tablets (80 mg total) by mouth daily with breakfast. Take for 4 days, start the day after chemotherapy. 16 tablet 5  . prochlorperazine (COMPAZINE) 10 MG tablet Take 1 tablet (10 mg total) by mouth every 6 (six) hours as needed for nausea or vomiting. 30 tablet 1  . simvastatin (ZOCOR) 10 MG tablet Take 1 tablet (10 mg total) by mouth at bedtime. 90 tablet 3  . traZODone (DESYREL) 100 MG tablet Take 2 tablets (200 mg total) by mouth at bedtime. 180 tablet 0  . venlafaxine XR (EFFEXOR-XR) 75 MG 24 hr capsule Take 1 capsule (75 mg total) by mouth daily with breakfast. 90 capsule 0   No current facility-administered medications for this visit.      Musculoskeletal: Strength & Muscle Tone: within normal limits Gait & Station: normal Patient leans: N/A  Psychiatric Specialty Exam: Review of Systems  Gastrointestinal: Positive for abdominal pain, diarrhea and nausea. Negative for vomiting.  Musculoskeletal: Positive for joint pain. Negative for back pain and neck pain.    Blood pressure 122/70, height 5'  4" (1.626 m), weight 155 lb (70.3 kg).Body mass index is 26.61 kg/m.  General Appearance: Casual and Fairly Groomed  Eye Contact:  Good  Speech:  Clear and Coherent and Normal Rate  Volume:  Normal  Mood:  Euthymic  Affect:  Full Range  Thought Process:  Goal Directed and Descriptions of Associations: Intact  Orientation:  Full (Time, Place, and  Person)  Thought Content:  Logical  Suicidal Thoughts:  No  Homicidal Thoughts:  No  Memory:  Immediate;   Good  Judgement:  Good  Insight:  Good  Psychomotor Activity:  Normal  Concentration:  Concentration: Good  Recall:  Good  Fund of Knowledge:  Good  Language:  Good  Akathisia:  No  Handed:  Right  AIMS (if indicated):     Assets:  Communication Skills Desire for Improvement Financial Resources/Insurance Housing Intimacy Leisure Time Resilience Social Support Talents/Skills Transportation  ADL's:  Intact  Cognition:  WNL  Sleep:   good      Screenings: AUDIT     Counselor from 09/10/2015 in [facility]  Alcohol Use Disorder Identification Test Final Score (AUDIT)  34    CAGE-AID     Counselor from 09/10/2015 in [facility] [place] Score  4    GAD-7     Counselor from 09/14/2015 in [facility]  Total GAD-7 Score  7    PHQ2-9     Office Visit from 02/16/2017 in [facility] Office Visit from 12/12/2016 in [facility] from 11/05/2015 in [facility] from 10/16/2015 in [facility] from 09/10/2015 in [facility]  PHQ-2 Total Score  0  2  2  0  2  PHQ-9 Total Score  -  6  9  -  9      I have reviewed the information below on 03/08/2018 and have updated it Assessment and Plan: MDD-recurrent, moderate; GAD; insomnia; alcohol use disorder in remission; nicotine dependence     Medication management with supportive therapy. Risks and benefits, side effects and alternative treatment options discussed with patient. Pt was given an opportunity to ask questions about medication, illness, and treatment. All current psychiatric medications have been reviewed and discussed with the patient and adjusted as  clinically appropriate. The patient has been provided an accurate and updated list of the medications being now prescribed. Patient expressed understanding of how their medications were to be used.  Pt verbalized understanding and verbal consent obtained for treatment.  The risk of un-intended pregnancy is low based on the fact that pt reports she is post menopausal. Pt is aware that these meds carry a teratogenic risk. Pt will discuss plan of action if she does or plans to become pregnant in the future.  Status of current problems: doing well  Meds:  Effexor XR 75 mg p.o. daily for MDD and GAD Wellbutrin SR 150 mg p.o. twice daily for MDD.  It is not effective for smoking cessation Trazodone 200 mg p.o. nightly for insomnia BuSpar 10 mg p.o. 3 times daily for GAD She states that this medication regime has really worked well for her and wants to continue.   Labs: Reviewed the labs done on 02/26/2018- CMP shows BUN of 7 otherwise normal, CBC shows mildly increased MCV and  RDW  Therapy: brief supportive therapy provided. Discussed psychosocial stressors in  detail.     Consultations: Encouraged to follow up with PCP and continue to work closely with her oncologist  Pt denies SI and is at an acute low risk for suicide. Patient told to call clinic if any problems occur. Patient advised to go to ER if they should develop SI/HI, side effects, or if symptoms worsen. Has crisis numbers to call if needed. Pt verbalized understanding.  F/up in 3 months or sooner if needed  The duration of this appointment visit was 20 minutes of face-to-face time with the patient.  Greater than 50% of this time was spent in counseling, explanation of  diagnosis, planning of further management, and coordination of care     [name], MD 03/08/2018, 8:40 AM

## 2018-03-12 ENCOUNTER — Other Ambulatory Visit: Payer: Self-pay

## 2018-03-12 MED ORDER — LEVOTHYROXINE SODIUM 50 MCG PO TABS: ORAL_TABLET | ORAL | 0 refills | Status: DC

## 2018-03-12 NOTE — Telephone Encounter (Signed)
Refill request, #30 day supply sent to pharmacy, Patient needs appt for further refill.

## 2018-03-16 ENCOUNTER — Other Ambulatory Visit: Payer: Self-pay | Admitting: *Deleted

## 2018-03-16 DIAGNOSIS — C8338 Diffuse large B-cell lymphoma, lymph nodes of multiple sites: Secondary | ICD-10-CM

## 2018-03-18 ENCOUNTER — Other Ambulatory Visit: Payer: Self-pay | Admitting: Oncology

## 2018-03-19 ENCOUNTER — Inpatient Hospital Stay: Payer: Medicare Other

## 2018-03-19 ENCOUNTER — Encounter: Payer: Self-pay | Admitting: Nurse Practitioner

## 2018-03-19 ENCOUNTER — Inpatient Hospital Stay (HOSPITAL_BASED_OUTPATIENT_CLINIC_OR_DEPARTMENT_OTHER): Payer: Medicare Other | Admitting: Nurse Practitioner

## 2018-03-19 ENCOUNTER — Other Ambulatory Visit: Payer: Self-pay

## 2018-03-19 VITALS — BP 123/65 | HR 74 | Temp 97.9°F | Resp 19 | Ht 64.0 in | Wt 159.0 lb

## 2018-03-19 DIAGNOSIS — E039 Hypothyroidism, unspecified: Secondary | ICD-10-CM

## 2018-03-19 DIAGNOSIS — C8338 Diffuse large B-cell lymphoma, lymph nodes of multiple sites: Secondary | ICD-10-CM

## 2018-03-19 DIAGNOSIS — Z95828 Presence of other vascular implants and grafts: Secondary | ICD-10-CM

## 2018-03-19 DIAGNOSIS — Z5189 Encounter for other specified aftercare: Secondary | ICD-10-CM | POA: Diagnosis not present

## 2018-03-19 DIAGNOSIS — F17210 Nicotine dependence, cigarettes, uncomplicated: Secondary | ICD-10-CM

## 2018-03-19 DIAGNOSIS — R110 Nausea: Secondary | ICD-10-CM

## 2018-03-19 DIAGNOSIS — E7800 Pure hypercholesterolemia, unspecified: Secondary | ICD-10-CM

## 2018-03-19 DIAGNOSIS — M7989 Other specified soft tissue disorders: Secondary | ICD-10-CM

## 2018-03-19 DIAGNOSIS — I10 Essential (primary) hypertension: Secondary | ICD-10-CM

## 2018-03-19 DIAGNOSIS — K5900 Constipation, unspecified: Secondary | ICD-10-CM | POA: Diagnosis not present

## 2018-03-19 DIAGNOSIS — Z800 Family history of malignant neoplasm of digestive organs: Secondary | ICD-10-CM

## 2018-03-19 DIAGNOSIS — Z808 Family history of malignant neoplasm of other organs or systems: Secondary | ICD-10-CM

## 2018-03-19 DIAGNOSIS — R600 Localized edema: Secondary | ICD-10-CM

## 2018-03-19 LAB — CMP (CANCER CENTER ONLY)
ALT: 12 U/L (ref 0–44)
AST: 16 U/L (ref 15–41)
Albumin: 3.6 g/dL (ref 3.5–5.0)
Alkaline Phosphatase: 61 U/L (ref 38–126)
Anion gap: 9 (ref 5–15)
BUN: 5 mg/dL — ABNORMAL LOW (ref 8–23)
CO2: 29 mmol/L (ref 22–32)
Calcium: 9.4 mg/dL (ref 8.9–10.3)
Chloride: 105 mmol/L (ref 98–111)
Creatinine: 0.69 mg/dL (ref 0.44–1.00)
GFR, Est AFR Am: >60 mL/min (ref >60)
GFR, Estimated: >60 mL/min (ref >60)
Glucose, Bld: 81 mg/dL (ref 70–99)
Potassium: 4 mmol/L (ref 3.5–5.1)
Sodium: 143 mmol/L (ref 135–145)
Total Bilirubin: 0.5 mg/dL (ref 0.3–1.2)
Total Protein: 6.3 g/dL — ABNORMAL LOW (ref 6.5–8.1)

## 2018-03-19 LAB — CBC WITH DIFFERENTIAL (CANCER CENTER ONLY)
Abs Immature Granulocytes: 0.06 10*3/uL (ref 0.00–0.07)
Basophils Absolute: 0.1 10*3/uL (ref 0.0–0.1)
Basophils Relative: 1 %
Eosinophils Absolute: 0 10*3/uL (ref 0.0–0.5)
Eosinophils Relative: 0 %
HCT: 38.8 % (ref 36.0–46.0)
Hemoglobin: 12.4 g/dL (ref 12.0–15.0)
Immature Granulocytes: 1 %
Lymphocytes Relative: 7 %
Lymphs Abs: 0.7 10*3/uL (ref 0.7–4.0)
MCH: 33.5 pg (ref 26.0–34.0)
MCHC: 32 g/dL (ref 30.0–36.0)
MCV: 104.9 fL — ABNORMAL HIGH (ref 80.0–100.0)
Monocytes Absolute: 0.6 10*3/uL (ref 0.1–1.0)
Monocytes Relative: 6 %
Neutro Abs: 8.5 10*3/uL — ABNORMAL HIGH (ref 1.7–7.7)
Neutrophils Relative %: 85 %
Platelet Count: 279 10*3/uL (ref 150–400)
RBC: 3.7 MIL/uL — ABNORMAL LOW (ref 3.87–5.11)
RDW: 15 % (ref 11.5–15.5)
WBC Count: 10 10*3/uL (ref 4.0–10.5)
nRBC: 0 % (ref 0.0–0.2)

## 2018-03-19 MED ORDER — HEPARIN SOD (PORK) LOCK FLUSH 100 UNIT/ML IV SOLN
500.0000 [IU] | Freq: Once | INTRAVENOUS | Status: AC | PRN
  Administered 2018-03-19: 500 [IU]
  Filled 2018-03-19: qty 5

## 2018-03-19 MED ORDER — LISINOPRIL 5 MG PO TABS: 5.0000 mg | ORAL_TABLET | Freq: Every day | ORAL | 1 refills | Status: DC

## 2018-03-19 MED ORDER — SODIUM CHLORIDE 0.9% FLUSH
10.0000 mL | INTRAVENOUS | Status: DC | PRN
  Administered 2018-03-19: 10 mL
  Filled 2018-03-19: qty 10

## 2018-03-19 NOTE — Progress Notes (Signed)
[facility] OFFICE PROGRESS NOTE   Diagnosis: Non-Hodgkin's lymphoma  INTERVAL HISTORY:   Kendra Ball returns as scheduled.  She completed cycle 5 CHOP/Rituxan 02/27/2018.  She has intermittent mild nausea.  She had a few mouth sores after the chemotherapy, now resolved.  She notes mild constipation after chemotherapy.  She continues to note swelling of the left arm and recently noted swelling of the left leg.  No numbness or tingling in her hands or feet.  Objective:  Vital signs in last 24 hours:  Blood pressure 123/65, pulse 74, temperature 97.9 F (36.6 C), temperature source Oral, resp. rate 19, height 5\' 4"  (1.626 m), weight 159 lb (72.1 kg), SpO2 98 %.    HEENT: No thrush or ulcers. Lymphatics: No palpable cervical, supraclavicular, axillary or inguinal lymph nodes. Resp: Lungs clear bilaterally. Cardio: Regular rate and rhythm. GI: Abdomen soft and nontender.  No hepatosplenomegaly. Vascular: Trace bilateral pretibial edema.  Trace edema left lower arm/hand. Port-A-Cath without erythema.  Lab Results:  Lab Results  Component Value Date   WBC 10.0 03/19/2018   HGB 12.4 03/19/2018   HCT 38.8 03/19/2018   MCV 104.9 (H) 03/19/2018   PLT 279 03/19/2018   NEUTROABS 8.5 (H) 03/19/2018    Imaging:  No results found.  Medications: I have reviewed the patient's current medications.  Assessment/Plan: 1. Non-Hodgkin's lymphoma, diffuse large B-cell lymphoma, CD20 positive  Ultrasound of the neck 02/18/2017-right supraclavicular adenopathy with at least 3 nodes, largest measuring 1.3 x 1.1 x 1.4 cm; 2 adjacent hypoechoic right occipital lymph nodes  Excisional biopsy right supraclavicular lymphadenopathy 24/08/8097 (Dr. Deetta Perla lymphoid proliferation. Necrotic soft tissue with associated acute and chronic inflammation.By flow cytometry no monoclonal B-cell or phenotypically aberrant T-cell population. Special stains for microorganisms  including AFB, GMS, PAS and Warthin-Starry, are negative.  CT chest 05/30/2017- multiple enlarged right axillary lymph nodes. Largest right axillary lymph node measures 2.6 x 2.2 cm. Single enlarged left axillary lymph node measuring 16 mm.  CT neck 06/03/2017-known bilateral axillary lymphadenopathy partially covered on the scan. No lymphadenopathy seen in the neck.  Biopsy right axillary lymph node 06/22/2017(Dr. Tsuei)- fibroadipose tissue with necrosis, chronic inflammation and granulation tissue  PET scan 07/19/2017-hypermetabolic bilateral axillary lymph nodes. Hypermetabolic but small right supraclavicular lymph nodes. In the right omentum there is a small focus of rim density and internal fat density which is hypermetabolic.  CT neck 10/25/2017- progressedbilateral subclavian lymphadenopathy since February, right greater than left. Conspicuous enlargement of the small lymph nodes at the right level 2 and 3 nodal stations.  CT chest/abdomen/pelvis 10/25/2017-progressive adenopathy identified within the left axilla, bilateral supraclavicular stations and left inguinal region. Persistent but improved right axillary adenopathy. Scattered indeterminate low-attenuation foci within the spleen not seen on study from 05/30/2017.  Excisional biopsy of right supraclavicular and posterior triangle lymph nodes on7/25/2019-diffuse large B-cell lymphoma, CD20 positive  PET scan 11/26/3823-[facility] hypermetabolic lymph nodes and progressive hypermetabolism in existing lymph nodes. Hypermetabolic focus along the anterior aspect of the right scapula with adjacent soft tissue mass. No new adenopathy involving the mediastinal or hilar regions of the chest or the abdomen or pelvis. No obvious involvement of the liver or spleen.  IPI score 3  Cycle 1 CHOP/Rituxan 12/04/2017  Cycle 2 CHOP/Rituxan 12/27/2017  Cycle 3 CHOP/Rituxan 01/16/2018  Cycle 4 CHOP/Rituxan 02/05/2018  PET 02/19/2018- resolution of  previously noted hypermetabolic adenopathy, no residual hypermetabolic osseous lesions  Cycle 5 CHOP/rituximab 02/27/2018 2. Hypertension  3. Hypothyroid 4. Hypercholesterolemia 5. Family history significant  for colon cancer, stomach cancer and brain cancer 6. Ongoing tobacco use 7. Port-A-Cath placement 12/01/2017, Interventional Radiology 8. Left arm edema-negative venous Doppler 02/26/2018    Disposition: Kendra Ball appears stable.  She has completed 5 cycles of CHOP/Rituxan.  Plan to proceed with cycle 6 as scheduled 03/20/2018.  She continues to have mild edema of the left lower arm/hand.  A venous Doppler was negative on 02/26/2018.  She will try elevating the arm.  She will return for lab, Port-A-Cath flush and a follow-up visit in 4 weeks.  She will contact the office in the interim with any problems.  We specifically discussed progressive swelling and/or pain, erythema of the left arm.  Plan reviewed with Dr. Benay Spice.    [name] ANP/GNP-BC   03/19/2018  10:19 AM

## 2018-03-20 ENCOUNTER — Telehealth: Payer: Self-pay | Admitting: Nurse Practitioner

## 2018-03-20 ENCOUNTER — Inpatient Hospital Stay: Payer: Medicare Other

## 2018-03-20 VITALS — BP 100/54 | HR 75 | Temp 98.8°F | Resp 16 | Ht 64.0 in | Wt 159.5 lb

## 2018-03-20 DIAGNOSIS — C8338 Diffuse large B-cell lymphoma, lymph nodes of multiple sites: Secondary | ICD-10-CM

## 2018-03-20 DIAGNOSIS — Z5189 Encounter for other specified aftercare: Secondary | ICD-10-CM | POA: Diagnosis not present

## 2018-03-20 MED ORDER — PALONOSETRON HCL INJECTION 0.25 MG/5ML
0.2500 mg | Freq: Once | INTRAVENOUS | Status: AC
  Administered 2018-03-20: 0.25 mg via INTRAVENOUS

## 2018-03-20 MED ORDER — DEXAMETHASONE SODIUM PHOSPHATE 10 MG/ML IJ SOLN
INTRAMUSCULAR | Status: AC
  Filled 2018-03-20: qty 1

## 2018-03-20 MED ORDER — SODIUM CHLORIDE 0.9 % IV SOLN
Freq: Once | INTRAVENOUS | Status: AC
  Administered 2018-03-20: 09:00:00 via INTRAVENOUS
  Filled 2018-03-20: qty 250

## 2018-03-20 MED ORDER — DEXAMETHASONE SODIUM PHOSPHATE 10 MG/ML IJ SOLN
10.0000 mg | Freq: Once | INTRAMUSCULAR | Status: AC
  Administered 2018-03-20: 10 mg via INTRAVENOUS

## 2018-03-20 MED ORDER — PALONOSETRON HCL INJECTION 0.25 MG/5ML
INTRAVENOUS | Status: AC
  Filled 2018-03-20: qty 5

## 2018-03-20 MED ORDER — ACETAMINOPHEN 325 MG PO TABS
ORAL_TABLET | ORAL | Status: AC
  Filled 2018-03-20: qty 2

## 2018-03-20 MED ORDER — SODIUM CHLORIDE 0.9 % IV SOLN
750.0000 mg/m2 | Freq: Once | INTRAVENOUS | Status: AC
  Administered 2018-03-20: 1380 mg via INTRAVENOUS
  Filled 2018-03-20: qty 69

## 2018-03-20 MED ORDER — HEPARIN SOD (PORK) LOCK FLUSH 100 UNIT/ML IV SOLN
500.0000 [IU] | Freq: Once | INTRAVENOUS | Status: AC | PRN
  Administered 2018-03-20: 500 [IU]
  Filled 2018-03-20: qty 5

## 2018-03-20 MED ORDER — VINCRISTINE SULFATE CHEMO INJECTION 1 MG/ML
2.0000 mg | Freq: Once | INTRAVENOUS | Status: AC
  Administered 2018-03-20: 2 mg via INTRAVENOUS
  Filled 2018-03-20: qty 2

## 2018-03-20 MED ORDER — DOXORUBICIN HCL CHEMO IV INJECTION 2 MG/ML
50.0000 mg/m2 | Freq: Once | INTRAVENOUS | Status: AC
  Administered 2018-03-20: 92 mg via INTRAVENOUS
  Filled 2018-03-20: qty 46

## 2018-03-20 MED ORDER — DIPHENHYDRAMINE HCL 25 MG PO CAPS
50.0000 mg | ORAL_CAPSULE | Freq: Once | ORAL | Status: AC
  Administered 2018-03-20: 50 mg via ORAL

## 2018-03-20 MED ORDER — DIPHENHYDRAMINE HCL 25 MG PO CAPS
ORAL_CAPSULE | ORAL | Status: AC
  Filled 2018-03-20: qty 2

## 2018-03-20 MED ORDER — SODIUM CHLORIDE 0.9% FLUSH
10.0000 mL | INTRAVENOUS | Status: DC | PRN
  Administered 2018-03-20: 10 mL
  Filled 2018-03-20: qty 10

## 2018-03-20 MED ORDER — ACETAMINOPHEN 325 MG PO TABS
650.0000 mg | ORAL_TABLET | Freq: Once | ORAL | Status: AC
  Administered 2018-03-20: 650 mg via ORAL

## 2018-03-20 MED ORDER — SODIUM CHLORIDE 0.9 % IV SOLN
375.0000 mg/m2 | Freq: Once | INTRAVENOUS | Status: AC
  Administered 2018-03-20: 700 mg via INTRAVENOUS
  Filled 2018-03-20: qty 20

## 2018-03-20 NOTE — Patient Instructions (Signed)
[facility] Discharge Instructions for Patients Receiving Chemotherapy  Today you received the following chemotherapy agents Doxorubicin (ADRIAMYCIN), Vincristine (ONCOVIN), Cyclophosphamide (CYTOXAN) & Rituximab (RITUXAN).  To help prevent nausea and vomiting after your treatment, we encourage you to take your nausea medication as prescribed.   If you develop nausea and vomiting that is not controlled by your nausea medication, call the clinic.   BELOW ARE SYMPTOMS THAT SHOULD BE REPORTED IMMEDIATELY:  *FEVER GREATER THAN 100.5 F  *CHILLS WITH OR WITHOUT FEVER  NAUSEA AND VOMITING THAT IS NOT CONTROLLED WITH YOUR NAUSEA MEDICATION  *UNUSUAL SHORTNESS OF BREATH  *UNUSUAL BRUISING OR BLEEDING  TENDERNESS IN MOUTH AND THROAT WITH OR WITHOUT PRESENCE OF ULCERS  *URINARY PROBLEMS  *BOWEL PROBLEMS  UNUSUAL RASH Items with * indicate a potential emergency and should be followed up as soon as possible.  Feel free to call the clinic should you have any questions or concerns. The clinic phone number is [phone].  Please show the CHEMO ALERT CARD at check-in to the Emergency Department and triage nurse.  

## 2018-03-20 NOTE — Telephone Encounter (Signed)
Scheduled appt per 11/25 los - sent reminder letter in the mail with appt date and time.

## 2018-03-21 ENCOUNTER — Inpatient Hospital Stay: Payer: Medicare Other

## 2018-03-21 DIAGNOSIS — Z5189 Encounter for other specified aftercare: Secondary | ICD-10-CM | POA: Diagnosis not present

## 2018-03-21 DIAGNOSIS — C8338 Diffuse large B-cell lymphoma, lymph nodes of multiple sites: Secondary | ICD-10-CM

## 2018-03-21 MED ORDER — PEGFILGRASTIM-CBQV 6 MG/0.6ML ~~LOC~~ SOSY
PREFILLED_SYRINGE | SUBCUTANEOUS | Status: AC
  Filled 2018-03-21: qty 0.6

## 2018-03-21 MED ORDER — PEGFILGRASTIM-CBQV 6 MG/0.6ML ~~LOC~~ SOSY
6.0000 mg | PREFILLED_SYRINGE | Freq: Once | SUBCUTANEOUS | Status: AC
  Administered 2018-03-21: 6 mg via SUBCUTANEOUS

## 2018-03-21 NOTE — Patient Instructions (Signed)
Pegfilgrastim injection [place]) What is this medicine? PEGFILGRASTIM (PEG fil gra stim) is a long-acting granulocyte colony-stimulating factor that stimulates the growth of neutrophils, a type of white blood cell important in the body's fight against infection. It is used to reduce the incidence of fever and infection in patients with certain types of cancer who are receiving chemotherapy that affects the bone marrow, and to increase survival after being exposed to high doses of radiation. This medicine may be used for other purposes; ask your health care provider or pharmacist if you have questions. COMMON BRAND NAME(S): Neulasta,Udenyca What should I tell my health care provider before I take this medicine? They need to know if you have any of these conditions: -kidney disease -latex allergy -ongoing radiation therapy -sickle cell disease -skin reactions to acrylic adhesives (On-Body Injector only) -an unusual or allergic reaction to pegfilgrastim, filgrastim, other medicines, foods, dyes, or preservatives -pregnant or trying to get pregnant -breast-feeding How should I use this medicine? This medicine is for injection under the skin. If you get this medicine at home, you will be taught how to prepare and give the pre-filled syringe or how to use the On-body Injector. Refer to the patient Instructions for Use for detailed instructions. Use exactly as directed. Tell your healthcare provider immediately if you suspect that the On-body Injector may not have performed as intended or if you suspect the use of the On-body Injector resulted in a missed or partial dose. It is important that you put your used needles and syringes in a special sharps container. Do not put them in a trash can. If you do not have a sharps container, call your pharmacist or healthcare provider to get one. Talk to your pediatrician regarding the use of this medicine in children. While this drug may be prescribed for selected  conditions, precautions do apply. Overdosage: If you think you have taken too much of this medicine contact a poison control center or emergency room at once. NOTE: This medicine is only for you. Do not share this medicine with others. What if I miss a dose? It is important not to miss your dose. Call your doctor or health care professional if you miss your dose. If you miss a dose due to an On-body Injector failure or leakage, a new dose should be administered as soon as possible using a single prefilled syringe for manual use. What may interact with this medicine? Interactions have not been studied. Give your health care provider a list of all the medicines, herbs, non-prescription drugs, or dietary supplements you use. Also tell them if you smoke, drink alcohol, or use illegal drugs. Some items may interact with your medicine. This list may not describe all possible interactions. Give your health care provider a list of all the medicines, herbs, non-prescription drugs, or dietary supplements you use. Also tell them if you smoke, drink alcohol, or use illegal drugs. Some items may interact with your medicine. What should I watch for while using this medicine? You may need blood work done while you are taking this medicine. If you are going to need a MRI, CT scan, or other procedure, tell your doctor that you are using this medicine (On-Body Injector only). What side effects may I notice from receiving this medicine? Side effects that you should report to your doctor or health care professional as soon as possible: -allergic reactions like skin rash, itching or hives, swelling of the face, lips, or tongue -dizziness -fever -pain, redness, or irritation at  site where injected -pinpoint red spots on the skin -red or dark-brown urine -shortness of breath or breathing problems -stomach or side pain, or pain at the shoulder -swelling -tiredness -trouble passing urine or change in the amount of  urine Side effects that usually do not require medical attention (report to your doctor or health care professional if they continue or are bothersome): -bone pain -muscle pain This list may not describe all possible side effects. Call your doctor for medical advice about side effects. You may report side effects to FDA at 1-800-FDA-1088. Where should I keep my medicine? Keep out of the reach of children. Store pre-filled syringes in a refrigerator between 2 and 8 degrees C (36 and 46 degrees F). Do not freeze. Keep in carton to protect from light. Throw away this medicine if it is left out of the refrigerator for more than 48 hours. Throw away any unused medicine after the expiration date. NOTE: This sheet is a summary. It may not cover all possible information. If you have questions about this medicine, talk to your doctor, pharmacist, or health care provider.  2018 Elsevier/Gold Standard (2016-04-07 12:58:03)  

## 2018-04-03 MED ORDER — DIPHENHYDRAMINE HCL 50 MG/ML IJ SOLN
INTRAMUSCULAR | Status: AC
  Filled 2018-04-03: qty 1

## 2018-04-03 MED ORDER — PALONOSETRON HCL INJECTION 0.25 MG/5ML
INTRAVENOUS | Status: AC
  Filled 2018-04-03: qty 5

## 2018-04-06 ENCOUNTER — Other Ambulatory Visit: Payer: Self-pay

## 2018-04-06 MED ORDER — LEVOTHYROXINE SODIUM 50 MCG PO TABS: ORAL_TABLET | ORAL | 0 refills | Status: DC

## 2018-04-06 NOTE — Telephone Encounter (Signed)
Message left for patient to return call to schedule an appointment for further refills on Levothyroxine. Refill sent with no further refills until seen in office.

## 2018-04-10 ENCOUNTER — Telehealth: Payer: Self-pay | Admitting: Family Medicine

## 2018-04-10 ENCOUNTER — Ambulatory Visit (INDEPENDENT_AMBULATORY_CARE_PROVIDER_SITE_OTHER): Payer: Medicare Other | Admitting: Family Medicine

## 2018-04-10 ENCOUNTER — Encounter: Payer: Self-pay | Admitting: Family Medicine

## 2018-04-10 VITALS — BP 105/75 | HR 74 | Temp 97.4°F | Resp 16 | Ht 64.5 in | Wt 156.0 lb

## 2018-04-10 DIAGNOSIS — E785 Hyperlipidemia, unspecified: Secondary | ICD-10-CM

## 2018-04-10 DIAGNOSIS — I10 Essential (primary) hypertension: Secondary | ICD-10-CM

## 2018-04-10 DIAGNOSIS — C8330 Diffuse large B-cell lymphoma, unspecified site: Secondary | ICD-10-CM

## 2018-04-10 DIAGNOSIS — E039 Hypothyroidism, unspecified: Secondary | ICD-10-CM

## 2018-04-10 DIAGNOSIS — J449 Chronic obstructive pulmonary disease, unspecified: Secondary | ICD-10-CM

## 2018-04-10 LAB — LIPID PANEL
Cholesterol: 150 mg/dL (ref 0–200)
HDL: 67.9 mg/dL (ref >39.00)
LDL Cholesterol: 62 mg/dL (ref 0–99)
NonHDL: 82.13
Total CHOL/HDL Ratio: 2
Triglycerides: 101 mg/dL (ref 0.0–149.0)
VLDL: 20.2 mg/dL (ref 0.0–40.0)

## 2018-04-10 LAB — TSH: TSH: 1.4 u[IU]/mL (ref 0.35–4.50)

## 2018-04-10 MED ORDER — LEVOTHYROXINE SODIUM 100 MCG PO TABS: 100.0000 ug | ORAL_TABLET | Freq: Every day | ORAL | 3 refills | Status: DC

## 2018-04-10 MED ORDER — BUDESONIDE-FORMOTEROL FUMARATE 160-4.5 MCG/ACT IN AERO: 2.0000 | INHALATION_SPRAY | Freq: Two times a day (BID) | RESPIRATORY_TRACT | 6 refills | Status: DC

## 2018-04-10 MED ORDER — SIMVASTATIN 10 MG PO TABS: 10.0000 mg | ORAL_TABLET | Freq: Every day | ORAL | 3 refills | Status: DC

## 2018-04-10 MED ORDER — ALBUTEROL SULFATE HFA 108 (90 BASE) MCG/ACT IN AERS: 2.0000 | INHALATION_SPRAY | Freq: Four times a day (QID) | RESPIRATORY_TRACT | 2 refills | Status: DC | PRN

## 2018-04-10 NOTE — Progress Notes (Signed)
Kendra Ball , July 24, 1945, 72 y.o., female MRN: [id number] Patient Care Team    Relationship Specialty Notifications Start End  Ma Hillock, DO PCP - General Family Medicine  12/30/14   Juanita Craver, MD Consulting Physician Gastroenterology  05/09/14   Excell Seltzer, MD Consulting Physician General Surgery  05/09/14   Brandon Melnick, [place]  Psychology  10/16/15   [name], MD  Psychiatry  12/12/16     Chief Complaint  Patient presents with  . Follow-up    [facility], pt feels like her BP has been running low, having dizzy spells     Subjective:  Hypertension/hyperlipidemia: Patient reports she has not been taking her lisinopril because she was becoming dizzy. Her BP without has been < 762/83 systolic. Patient denies chest pain, shortness of breath, or lower extremity edema. She is taking a low dose statin.  She has lost weight slowly, since she has stopped drinking. Last chemo mid- Novemeber- in remission now.  Hypothyroidism, unspecified type She reports compliance with levothyroxine 100 mcg daily.  She reports she is feeling well. COPD (chronic obstructive pulmonary disease) with chronic bronchitis ([place]) She reports compliance with Symbicort 2 puffs twice daily and albuterol inhaler when needed.  She denies any shortness of breath or wheezing.  Diffuse large B-cell lymphoma, unspecified body region [facility]) She is established with oncology and had her last chemo treatment.  She states overall she feels she tolerated the chemo rather well.   Depression screen [facility] 2/9 04/10/2018 02/16/2017 12/12/2016 10/16/2015 09/10/2014  Decreased Interest 0 0 2 0 0  Down, Depressed, Hopeless 0 0 0 0 0  PHQ - 2 Score 0 0 2 0 0  Altered sleeping 0 - 0 - -  Tired, decreased energy 2 - 3 - -  Change in appetite 2 - 0 - -  Feeling bad or failure about yourself  0 - 1 - -  Trouble concentrating 2 - 0 - -  Moving slowly or fidgety/restless 0 - 0 - -  Suicidal thoughts - - 0 - -  PHQ-9 Score 6 - 6  - -  Difficult doing work/chores Not difficult at all - Not difficult at all - -  Some encounter information is confidential and restricted. Go to Review Flowsheets activity to see all data.  Some recent data might be hidden    Allergies  Allergen Reactions  . Morphine Sulfate Swelling and Other (See Comments)    Swelling around injection area   Social History   Tobacco Use  . Smoking status: Current Every Day Smoker    Packs/day: 1.00    Years: 50.00    Pack years: 50.00    Types: Cigarettes  . Smokeless tobacco: Never Used  Substance Use Topics  . Alcohol use: No    Comment: quit on Aug 27, 2015   Past Medical History:  Diagnosis Date  . Adenopathy    right supraclavicular  . Anxiety   . Arthritis   . COPD (chronic obstructive pulmonary disease) ([place])   . DEPRESSION 08/26/2008  . Hx of colonic polyps    First noted on  colonoscopy 2012  . Hypercholesteremia   . HYPERTENSION 08/26/2008  . HYPOTHYROIDISM 08/26/2008  . Obesity   . Osteoporosis   . Substance abuse [facility])    inpatient and outpatient tx for substance abuse  . TOBACCO USER 12/25/2009   Past Surgical History:  Procedure Laterality Date  . AXILLARY LYMPH NODE BIOPSY Right 06/22/2017   Procedure: RIGHT AXILLARY  LYMPH NODE BIOPSY;  Surgeon: [name], MD;  Location: [facility];  Service: General;  Laterality: Right;  . [place] SURGERY  2006  . CESAREAN SECTION    . COLON RESECTION N/A 05/09/2014   Procedure: LAPAROSCOPIC HAND-ASSISTED EXTENDED RIGHT COLECTOMY, LAPAROSCOPIC LYSIS OF ADHESIONS, SPLENIC FLEXURE MOBILIZATION.;  Surgeon: [name], MD;  Location: [facility];  Service: General;  Laterality: N/A;  . DILATION AND CURETTAGE OF UTERUS     x2  . FOREARM FRACTURE SURGERY     right  . GASTRIC BYPASS  2006  . IR IMAGING GUIDED PORT INSERTION  12/01/2017  . MASS EXCISION Right 02/24/2017   Procedure: EXCISIONAL BIOPSY RIGHT SUPRA-CLAVICULAR NODE;  Surgeon: [name], MD;  Location: [facility];  Service: ENT;  Laterality: Right;  . TONSILLECTOMY     Family History  Problem Relation Age of Onset  . Cancer Mother        colon  . Dementia Mother   . Depression Mother   . Heart disease Father 66  . Heart disease Sister 9       stents, pacemaker  . Depression Sister   . Heart disease Brother 49       CABG, MI  . Depression Brother   . Obesity Daughter   . Suicidality Daughter   . Bipolar disorder Daughter   . Obesity Son   . Mental illness Daughter        bipolar  . Obesity Daughter   . Depression Sister   . Dementia Paternal Aunt   . Alcohol abuse Maternal Uncle   . Colon cancer Maternal Aunt   . Stomach cancer Maternal Grandfather    Allergies as of 04/10/2018      Reactions   Morphine Sulfate Swelling, Other (See Comments)   Swelling around injection area      Medication List       Accurate as of April 10, 2018 10:36 AM. Always use your most recent med list.        albuterol 108 (90 Base) MCG/ACT inhaler Commonly known as:  PROVENTIL HFA;VENTOLIN HFA Inhale 2 puffs into the lungs every 6 (six) hours as needed for wheezing or shortness of breath.   budesonide-formoterol 160-4.5 MCG/ACT inhaler Commonly known as:  SYMBICORT Inhale 2 puffs into the lungs 2 (two) times daily.   buPROPion 150 MG 12 hr tablet Commonly known as:  WELLBUTRIN SR Take 1 tablet (150 mg total) by mouth 2 (two) times daily.   busPIRone 10 MG tablet Commonly known as:  BUSPAR Take 1 tablet (10 mg total) by mouth 3 (three) times daily.   ferrous fumarate 325 (106 Fe) MG Tabs tablet Commonly known as:  HEMOCYTE - 106 mg FE Take 1 tablet by mouth daily. Reported on 10/06/2015   levothyroxine 50 MCG tablet Commonly known as:  SYNTHROID, LEVOTHROID TAKE 2 TABLETS (100 MCG TOTAL) BY MOUTH DAILY BEFORE BREAKFAST.   lisinopril 5 MG tablet Commonly known as:  PRINIVIL,ZESTRIL Take 1 tablet (5 mg total) by mouth daily. Needs an appointment for further refills     Magnesium 250 MG Tabs Take 250 mg by mouth 2 (two) times daily.   simvastatin 10 MG tablet Commonly known as:  ZOCOR Take 1 tablet (10 mg total) by mouth at bedtime.   traZODone 100 MG tablet Commonly known as:  DESYREL Take 2 tablets (200 mg total) by mouth at bedtime.   venlafaxine XR 75 MG 24 hr capsule Commonly known as:  EFFEXOR-XR Take  1 capsule (75 mg total) by mouth daily with breakfast.   VITAMIN B-12 PO Take 1,000 mg by mouth daily.       All past medical history, surgical history, allergies, family history, immunizations andmedications were updated in the EMR today and reviewed under the history and medication portions of their EMR.     ROS: Negative, with the exception of above mentioned in HPI   Objective:  BP 105/75 (BP Location: Left Arm, Patient Position: Sitting, Cuff Size: Normal)   Pulse 74   Temp (!) 97.4 F (36.3 C) (Oral)   Resp 16   Ht 5' 4.5" (1.638 m)   Wt 156 lb (70.8 kg)   SpO2 97%   BMI 26.36 kg/m  Body mass index is 26.36 kg/m. Gen: Afebrile. No acute distress. Nontoxic in appearance, well developed, well nourished.  Pleasant Caucasian female HENT: AT. [place]. MMM.  No thyromegaly Eyes:Pupils Equal Round Reactive to light, Extraocular movements intact,  Conjunctiva without redness, discharge or icterus. CV: RRR  Chest: CTAB, no wheeze or crackles. Good air movement, normal resp effort. Skin: no rashes, purpura or petechiae.  Neuro:  Normal gait. PERLA. EOMi. Alert. Oriented x3  Psych: Normal affect, dress and demeanor. Normal speech. Normal thought content and judgment.  No exam data present No results found. No results found for this or any previous visit (from the past 24 hour(s)).  Assessment/Plan: WENONAH MILO is a 72 y.o. female present for OV for  Hypothyroidism, unspecified type Feels well.  TSH collected today.  Refills will be provided after results received.  Follow-up yearly.  Dyslipidemia/Essential  hypertension Blood pressures are low.  Stop lisinopril. -Lipid panel collected today.  Zocor refilled. -She was encouraged to monitor her blood pressures.  As her diet returns to normal since chemo is over, she may find her blood pressures start to rise again.  If elevated above 140/90 she should be seen to restart blood pressure management.  COPD (chronic obstructive pulmonary disease) with chronic bronchitis ([facility]) Stable.  Refills on Symbicort and albuterol provided today.  Diffuse large B-cell lymphoma, unspecified body region [facility]) Finished chemo few weeks ago, doing rather well.  Has scheduled follow-ups with oncology   Reviewed expectations re: course of current medical issues.  Discussed self-management of symptoms.  Outlined signs and symptoms indicating need for more acute intervention.  Patient verbalized understanding and all questions were answered.  Patient received an After-Visit Summary.   > 25 minutes spent with patient, >50% of time spent face to face  No orders of the defined types were placed in this encounter.    Note is dictated utilizing voice recognition software. Although note has been proof read prior to signing, occasional typographical errors still can be missed. If any questions arise, please do not hesitate to call for verification.   electronically signed by:  Howard Pouch, DO  [place]

## 2018-04-10 NOTE — Patient Instructions (Signed)
Stop the lisinopril. Monitor BP, if you start to gain weight back. I will refill meds after I get the lab results.   Happy holidays! I am glad your doing well.   Please help [place] help you:  We are honored you have chosen [facility] for your Primary Care home. Below you will find basic instructions that you may need to access in the future. Please help [place] help you by reading the instructions, which cover many of the frequent questions we experience.   Prescription refills and request:  -In order to allow more efficient response time, please call your pharmacy for all refills. They will forward the request electronically to [place]. This allows for the quickest possible response. Request left on a nurse line can take longer to refill, since these are checked as time allows between office patients and other phone calls.  - refill request can take up to 3-5 working days to complete.  - If request is sent electronically and request is appropiate, it is usually completed in 1-2 business days.  - all patients will need to be seen routinely for all chronic medical conditions requiring prescription medications (see follow-up below). If you are overdue for follow up on your condition, you will be asked to make an appointment and we will call in enough medication to cover you until your appointment (up to 30 days).  - all controlled substances will require a face to face visit to request/refill.  - if you desire your prescriptions to go through a new pharmacy, and have an active script at original pharmacy, you will need to call your pharmacy and have scripts transferred to new pharmacy. This is completed between the pharmacy locations and not by your provider.    Results: If any images or labs were ordered, it can take up to 1 week to get results depending on the test ordered and the lab/facility running and resulting the test. - Normal or stable results, which do not need further discussion, may be released  to your mychart immediately with attached note to you. A call may not be generated for normal results. Please make certain to sign up for mychart. If you have questions on how to activate your mychart you can call the front office.  - If your results need further discussion, our office will attempt to contact you via phone, and if unable to reach you after 2 attempts, we will release your abnormal result to your mychart with instructions.  - All results will be automatically released in mychart after 1 week.  - Your provider will provide you with explanation and instruction on all relevant material in your results. Please keep in mind, results and labs may appear confusing or abnormal to the untrained eye, but it does not mean they are actually abnormal for you personally. If you have any questions about your results that are not covered, or you desire more detailed explanation than what was provided, you should make an appointment with your provider to do so.   Our office handles many outgoing and incoming calls daily. If we have not contacted you within 1 week about your results, please check your mychart to see if there is a message first and if not, then contact our office.  In helping with this matter, you help decrease call volume, and therefore allow [place] to be able to respond to patients needs more efficiently.   Acute office visits (sick visit):  An acute visit is intended for a  new problem and are scheduled in shorter time slots to allow schedule openings for patients with new problems. This is the appropriate visit to discuss a new problem. Problems will not be addressed by phone call or Echart message. Appointment is needed if requesting treatment. In order to provide you with excellent quality medical care with proper time for you to explain your problem, have an exam and receive treatment with instructions, these appointments should be limited to one new problem per visit. If you experience a new  problem, in which you desire to be addressed, please make an acute office visit, we save openings on the schedule to accommodate you. Please do not save your new problem for any other type of visit, let us take care of it properly and quickly for you.   Follow up visits:  Depending on your condition(s) your provider will need to see you routinely in order to provide you with quality care and prescribe medication(s). Most chronic conditions (Example: hypertension, Diabetes, depression/anxiety... etc), require visits a couple times a year. Your provider will instruct you on proper follow up for your personal medical conditions and history. Please make certain to make follow up appointments for your condition as instructed. Failing to do so could result in lapse in your medication treatment/refills. If you request a refill, and are overdue to be seen on a condition, we will always provide you with a 30 day script (once) to allow you time to schedule.    Medicare wellness (well visit): - we have a wonderful Nurse Maudie Mercury), that will meet with you and provide you will yearly medicare wellness visits. These visits should occur yearly (can not be scheduled less than 1 calendar year apart) and cover preventive health, immunizations, advance directives and screenings you are entitled to yearly through your medicare benefits. Do not miss out on your entitled benefits, this is when medicare will pay for these benefits to be ordered for you.  These are strongly encouraged by your provider and is the appropriate type of visit to make certain you are up to date with all preventive health benefits. If you have not had your medicare wellness exam in the last 12 months, please make certain to schedule one by calling the office and schedule your medicare wellness with Maudie Mercury as soon as possible.   Yearly physical (well visit):  - Adults are recommended to be seen yearly for physicals. Check with your insurance and date of your  last physical, most insurances require one calendar year between physicals. Physicals include all preventive health topics, screenings, medical exam and labs that are appropriate for gender/age and history. You may have fasting labs needed at this visit. This is a well visit (not a sick visit), new problems should not be covered during this visit (see acute visit).  - Pediatric patients are seen more frequently when they are younger. Your provider will advise you on well child visit timing that is appropriate for your their age. - This is not a medicare wellness visit. Medicare wellness exams do not have an exam portion to the visit. Some medicare companies allow for a physical, some do not allow a yearly physical. If your medicare allows a yearly physical you can schedule the medicare wellness with our nurse Maudie Mercury and have your physical with your provider after, on the same day. Please check with insurance for your full benefits.   Late Policy/No Shows:  - all new patients should arrive 15-30 minutes earlier than appointment to allow  [place] time  to  obtain all personal demographics,  insurance information and for you to complete office paperwork. - All established patients should arrive 10-15 minutes earlier than appointment time to update all information and be checked in .  - In our best efforts to run on time, if you are late for your appointment you will be asked to either reschedule or if able, we will work you back into the schedule. There will be a wait time to work you back in the schedule,  depending on availability.  - If you are unable to make it to your appointment as scheduled, please call 24 hours ahead of time to allow [place] to fill the time slot with someone else who needs to be seen. If you do not cancel your appointment ahead of time, you may be charged a no show fee.

## 2018-04-10 NOTE — Telephone Encounter (Signed)
Please inform patient the following information: Her labs look wonderful. I refilled her thyroid med for the 100 mcg tab dose- only take one pill if they have in stock. If they do not have in stock they will dispense th 50 mcg and then she would take two tabs. READ the label.

## 2018-04-11 ENCOUNTER — Encounter: Payer: Self-pay | Admitting: Family Medicine

## 2018-04-11 NOTE — Telephone Encounter (Signed)
Patient notified and verbalized understanding. 

## 2018-04-24 ENCOUNTER — Inpatient Hospital Stay: Payer: Medicare Other

## 2018-04-24 ENCOUNTER — Other Ambulatory Visit: Payer: Self-pay

## 2018-04-24 ENCOUNTER — Inpatient Hospital Stay: Payer: Medicare Other | Attending: Hematology and Oncology | Admitting: Oncology

## 2018-04-24 VITALS — BP 135/64 | HR 75 | Temp 97.8°F | Resp 18 | Ht 64.5 in | Wt 155.1 lb

## 2018-04-24 DIAGNOSIS — Z808 Family history of malignant neoplasm of other organs or systems: Secondary | ICD-10-CM

## 2018-04-24 DIAGNOSIS — I10 Essential (primary) hypertension: Secondary | ICD-10-CM | POA: Diagnosis not present

## 2018-04-24 DIAGNOSIS — C8338 Diffuse large B-cell lymphoma, lymph nodes of multiple sites: Secondary | ICD-10-CM | POA: Diagnosis not present

## 2018-04-24 DIAGNOSIS — E7800 Pure hypercholesterolemia, unspecified: Secondary | ICD-10-CM | POA: Insufficient documentation

## 2018-04-24 DIAGNOSIS — E039 Hypothyroidism, unspecified: Secondary | ICD-10-CM | POA: Diagnosis not present

## 2018-04-24 DIAGNOSIS — Z800 Family history of malignant neoplasm of digestive organs: Secondary | ICD-10-CM | POA: Insufficient documentation

## 2018-04-24 DIAGNOSIS — Z95828 Presence of other vascular implants and grafts: Secondary | ICD-10-CM

## 2018-04-24 LAB — CBC WITH DIFFERENTIAL (CANCER CENTER ONLY)
Abs Immature Granulocytes: 0.02 10*3/uL (ref 0.00–0.07)
Basophils Absolute: 0.1 10*3/uL (ref 0.0–0.1)
Basophils Relative: 1 %
Eosinophils Absolute: 0.1 10*3/uL (ref 0.0–0.5)
Eosinophils Relative: 2 %
HCT: 40.5 % (ref 36.0–46.0)
Hemoglobin: 13.1 g/dL (ref 12.0–15.0)
Immature Granulocytes: 0 %
Lymphocytes Relative: 11 %
Lymphs Abs: 0.7 10*3/uL (ref 0.7–4.0)
MCH: 34 pg (ref 26.0–34.0)
MCHC: 32.3 g/dL (ref 30.0–36.0)
MCV: 105.2 fL — ABNORMAL HIGH (ref 80.0–100.0)
Monocytes Absolute: 0.7 10*3/uL (ref 0.1–1.0)
Monocytes Relative: 11 %
Neutro Abs: 4.7 10*3/uL (ref 1.7–7.7)
Neutrophils Relative %: 75 %
Platelet Count: 227 10*3/uL (ref 150–400)
RBC: 3.85 MIL/uL — ABNORMAL LOW (ref 3.87–5.11)
RDW: 13.2 % (ref 11.5–15.5)
WBC Count: 6.1 10*3/uL (ref 4.0–10.5)
nRBC: 0 % (ref 0.0–0.2)

## 2018-04-24 LAB — CMP (CANCER CENTER ONLY)
ALT: 14 U/L (ref 0–44)
AST: 18 U/L (ref 15–41)
Albumin: 3.6 g/dL (ref 3.5–5.0)
Alkaline Phosphatase: 60 U/L (ref 38–126)
Anion gap: 11 (ref 5–15)
BUN: 10 mg/dL (ref 8–23)
CO2: 26 mmol/L (ref 22–32)
Calcium: 9.4 mg/dL (ref 8.9–10.3)
Chloride: 105 mmol/L (ref 98–111)
Creatinine: 0.66 mg/dL (ref 0.44–1.00)
GFR, Est AFR Am: >60 mL/min (ref >60)
GFR, Estimated: >60 mL/min (ref >60)
Glucose, Bld: 82 mg/dL (ref 70–99)
Potassium: 3.9 mmol/L (ref 3.5–5.1)
Sodium: 142 mmol/L (ref 135–145)
Total Bilirubin: 0.5 mg/dL (ref 0.3–1.2)
Total Protein: 6.3 g/dL — ABNORMAL LOW (ref 6.5–8.1)

## 2018-04-24 MED ORDER — SODIUM CHLORIDE 0.9% FLUSH
10.0000 mL | INTRAVENOUS | Status: DC | PRN
  Administered 2018-04-24: 10 mL
  Filled 2018-04-24: qty 10

## 2018-04-24 MED ORDER — HEPARIN SOD (PORK) LOCK FLUSH 100 UNIT/ML IV SOLN
500.0000 [IU] | Freq: Once | INTRAVENOUS | Status: AC | PRN
  Administered 2018-04-24: 500 [IU]
  Filled 2018-04-24: qty 5

## 2018-04-24 NOTE — Telephone Encounter (Signed)
Pt pharmacy sent a rf request for lisinopril. Denied rf request because pt is no longer on this medication.

## 2018-04-24 NOTE — Progress Notes (Signed)
[place] OFFICE PROGRESS NOTE   Diagnosis: Non-Hodgkin's lymphoma  INTERVAL HISTORY:   [name] completed a final cycle of CHOP-rituximab on 03/20/2018.  She reports tolerating the treatment well.  She continues to have malaise.  There is mild swelling at the left lower arm.  No other complaint.  Objective:  Vital signs in last 24 hours:  Blood pressure 135/64, pulse 75, temperature 97.8 F (36.6 C), temperature source Oral, resp. rate 18, height 5' 4.5" (1.638 m), weight 155 lb 1.6 oz (70.4 kg), SpO2 97 %.    HEENT: No thrush or ulcers Lymphatics: No cervical, supraclavicular, axillary, or inguinal nodes Resp: Lungs clear bilaterally Cardio: Regular rate and rhythm GI: No hepatosplenomegaly, nontender Vascular: No leg edema slight enlargement of the left forearm compared to the right side with trace edema at the right hand.  No erythema or palpable cord.  No venous engorgement over the chest wall.  Portacath/PICC-without erythema  Lab Results:  Lab Results  Component Value Date   WBC 6.1 04/24/2018   HGB 13.1 04/24/2018   HCT 40.5 04/24/2018   MCV 105.2 (H) 04/24/2018   PLT 227 04/24/2018   NEUTROABS 4.7 04/24/2018    CMP  Lab Results  Component Value Date   NA 143 03/19/2018   K 4.0 03/19/2018   CL 105 03/19/2018   CO2 29 03/19/2018   GLUCOSE 81 03/19/2018   BUN 5 (L) 03/19/2018   CREATININE 0.69 03/19/2018   CALCIUM 9.4 03/19/2018   PROT 6.3 (L) 03/19/2018   ALBUMIN 3.6 03/19/2018   AST 16 03/19/2018   ALT 12 03/19/2018   ALKPHOS 61 03/19/2018   BILITOT 0.5 03/19/2018   GFRNONAA >60 03/19/2018   GFRAA >60 03/19/2018    Medications: I have reviewed the patient's current medications.   Assessment/Plan: 1. Non-Hodgkin's lymphoma, diffuse large B-cell lymphoma, CD20 positive  Ultrasound of the neck 02/18/2017-right supraclavicular adenopathy with at least 3 nodes, largest measuring 1.3 x 1.1 x 1.4 cm; 2 adjacent hypoechoic right  occipital lymph nodes  Excisional biopsy right supraclavicular lymphadenopathy 81/11/5629 (Dr. Deetta Perla lymphoid proliferation. Necrotic soft tissue with associated acute and chronic inflammation.By flow cytometry no monoclonal B-cell or phenotypically aberrant T-cell population. Special stains for microorganisms including AFB, GMS, PAS and Warthin-Starry, are negative.  CT chest 05/30/2017- multiple enlarged right axillary lymph nodes. Largest right axillary lymph node measures 2.6 x 2.2 cm. Single enlarged left axillary lymph node measuring 16 mm.  CT neck 06/03/2017-known bilateral axillary lymphadenopathy partially covered on the scan. No lymphadenopathy seen in the neck.  Biopsy right axillary lymph node 06/22/2017(Dr. Tsuei)- fibroadipose tissue with necrosis, chronic inflammation and granulation tissue  PET scan 07/19/2017-hypermetabolic bilateral axillary lymph nodes. Hypermetabolic but small right supraclavicular lymph nodes. In the right omentum there is a small focus of rim density and internal fat density which is hypermetabolic.  CT neck 10/25/2017- progressedbilateral subclavian lymphadenopathy since February, right greater than left. Conspicuous enlargement of the small lymph nodes at the right level 2 and 3 nodal stations.  CT chest/abdomen/pelvis 10/25/2017-progressive adenopathy identified within the left axilla, bilateral supraclavicular stations and left inguinal region. Persistent but improved right axillary adenopathy. Scattered indeterminate low-attenuation foci within the spleen not seen on study from 05/30/2017.  Excisional biopsy of right supraclavicular and posterior triangle lymph nodes on7/25/2019-diffuse large B-cell lymphoma, CD20 positive  PET scan 08/01/7024-[facility] hypermetabolic lymph nodes and progressive hypermetabolism in existing lymph nodes. Hypermetabolic focus along the anterior aspect of the right scapula with adjacent soft tissue mass.  No new  adenopathy involving the mediastinal or hilar regions of the chest or the abdomen or pelvis. No obvious involvement of the liver or spleen.  IPI score 3  Cycle 1 CHOP/Rituxan 12/04/2017  Cycle 2 CHOP/Rituxan 12/27/2017  Cycle 3 CHOP/Rituxan 01/16/2018  Cycle 4 CHOP/Rituxan 02/05/2018  PET 02/19/2018- resolution of previously noted hypermetabolic adenopathy, no residual hypermetabolic osseous lesions  Cycle 5 CHOP/rituximab 02/27/2018  Cycle 6 CHOP/rituximab 03/20/2018 2. Hypertension  3. Hypothyroid 4. Hypercholesterolemia 5. Family history significant for colon cancer, stomach cancer and brain cancer 6. Ongoing tobacco use 7. Port-A-Cath placement 12/01/2017, Interventional Radiology 8. Left arm edema-negative venous Doppler 02/26/2018      Disposition: Ms. Spurlock has completed the course of chemotherapy for treatment of non-Hodgkin's lymphoma.  She is in clinical remission.  She will return for an office visit and Port-A-Cath flush in 6 weeks.  We will refer her for Port-A-Cath removal if she remains stable in 6 weeks.  She will contact us for increased swelling at the left arm or hand.  I have a low clinical suspicion for a deep vein thrombosis.  Doppler of the left upper extremity was negative on 02/26/2018.    Betsy Coder, MD  04/24/2018  11:14 AM

## 2018-05-01 ENCOUNTER — Telehealth: Payer: Self-pay | Admitting: Nurse Practitioner

## 2018-05-01 NOTE — Telephone Encounter (Signed)
Scheduled appt per 12/31 los - patient is aware of appt date and time

## 2018-06-05 ENCOUNTER — Ambulatory Visit: Payer: Self-pay | Admitting: Nurse Practitioner

## 2018-06-06 ENCOUNTER — Encounter: Payer: Self-pay | Admitting: Nurse Practitioner

## 2018-06-06 ENCOUNTER — Inpatient Hospital Stay: Payer: Medicare Other | Attending: Hematology and Oncology

## 2018-06-06 ENCOUNTER — Inpatient Hospital Stay (HOSPITAL_BASED_OUTPATIENT_CLINIC_OR_DEPARTMENT_OTHER): Payer: Medicare Other | Admitting: Nurse Practitioner

## 2018-06-06 VITALS — BP 102/65 | HR 81 | Temp 97.7°F | Resp 19 | Ht 64.5 in | Wt 151.4 lb

## 2018-06-06 DIAGNOSIS — Z808 Family history of malignant neoplasm of other organs or systems: Secondary | ICD-10-CM | POA: Diagnosis not present

## 2018-06-06 DIAGNOSIS — M7989 Other specified soft tissue disorders: Secondary | ICD-10-CM

## 2018-06-06 DIAGNOSIS — Z800 Family history of malignant neoplasm of digestive organs: Secondary | ICD-10-CM | POA: Insufficient documentation

## 2018-06-06 DIAGNOSIS — E7800 Pure hypercholesterolemia, unspecified: Secondary | ICD-10-CM

## 2018-06-06 DIAGNOSIS — E039 Hypothyroidism, unspecified: Secondary | ICD-10-CM | POA: Insufficient documentation

## 2018-06-06 DIAGNOSIS — R0600 Dyspnea, unspecified: Secondary | ICD-10-CM | POA: Insufficient documentation

## 2018-06-06 DIAGNOSIS — C8338 Diffuse large B-cell lymphoma, lymph nodes of multiple sites: Secondary | ICD-10-CM | POA: Insufficient documentation

## 2018-06-06 DIAGNOSIS — I10 Essential (primary) hypertension: Secondary | ICD-10-CM

## 2018-06-06 DIAGNOSIS — F17210 Nicotine dependence, cigarettes, uncomplicated: Secondary | ICD-10-CM

## 2018-06-06 DIAGNOSIS — Z95828 Presence of other vascular implants and grafts: Secondary | ICD-10-CM

## 2018-06-06 MED ORDER — SODIUM CHLORIDE 0.9% FLUSH
10.0000 mL | INTRAVENOUS | Status: DC | PRN
  Administered 2018-06-06: 10 mL
  Filled 2018-06-06: qty 10

## 2018-06-06 MED ORDER — HEPARIN SOD (PORK) LOCK FLUSH 100 UNIT/ML IV SOLN
500.0000 [IU] | Freq: Once | INTRAVENOUS | Status: AC | PRN
  Administered 2018-06-06: 500 [IU]
  Filled 2018-06-06: qty 5

## 2018-06-06 NOTE — Progress Notes (Signed)
[place] OFFICE PROGRESS NOTE   Diagnosis: Non-Hodgkin's lymphoma  INTERVAL HISTORY:   Kendra Ball returns as scheduled.  She is feeling well overall.  She has a good energy level.  No fevers or sweats.  No nausea or vomiting.  No constipation or diarrhea.  She denies pain.  She notes less dyspnea.  For about the past 6 weeks she has noted a "knot" at the left forehead which fluctuates in size and is nontender, no redness.  She continues to note intermittent swelling of the left arm.  Objective:  Vital signs in last 24 hours:  Blood pressure 102/65, pulse 81, temperature 97.7 F (36.5 C), temperature source Oral, resp. rate 19, height 5' 4.5" (1.638 m), weight 151 lb 6.4 oz (68.7 kg), SpO2 98 %.    HEENT: Mild asymmetry left frontal region/eminence.  No palpable mass.  Neck without mass. Lymphatics: No palpable cervical, supraclavicular, axillary or inguinal lymph nodes. Resp: Lungs clear bilaterally. Cardio: Regular rate and rhythm. GI: Abdomen soft and nontender.  No hepatosplenomegaly. Vascular: No leg edema.  Trace edema left lower arm/hand.  No erythema.  No palpable cord.  Left chest wall with no venous engorgement. Port-A-Cath without erythema.   Lab Results:  Lab Results  Component Value Date   WBC 6.1 04/24/2018   HGB 13.1 04/24/2018   HCT 40.5 04/24/2018   MCV 105.2 (H) 04/24/2018   PLT 227 04/24/2018   NEUTROABS 4.7 04/24/2018    Imaging:  No results found.  Medications: I have reviewed the patient's current medications.  Assessment/Plan: 1. Non-Hodgkin's lymphoma, diffuse large B-cell lymphoma, CD20 positive  Ultrasound of the neck 02/18/2017-right supraclavicular adenopathy with at least 3 nodes, largest measuring 1.3 x 1.1 x 1.4 cm; 2 adjacent hypoechoic right occipital lymph nodes  Excisional biopsy right supraclavicular lymphadenopathy 95/09/2128 (Dr. Deetta Perla lymphoid proliferation. Necrotic soft tissue with associated  acute and chronic inflammation.By flow cytometry no monoclonal B-cell or phenotypically aberrant T-cell population. Special stains for microorganisms including AFB, GMS, PAS and Warthin-Starry, are negative.  CT chest 05/30/2017- multiple enlarged right axillary lymph nodes. Largest right axillary lymph node measures 2.6 x 2.2 cm. Single enlarged left axillary lymph node measuring 16 mm.  CT neck 06/03/2017-known bilateral axillary lymphadenopathy partially covered on the scan. No lymphadenopathy seen in the neck.  Biopsy right axillary lymph node 06/22/2017(Dr. Tsuei)- fibroadipose tissue with necrosis, chronic inflammation and granulation tissue  PET scan 07/19/2017-hypermetabolic bilateral axillary lymph nodes. Hypermetabolic but small right supraclavicular lymph nodes. In the right omentum there is a small focus of rim density and internal fat density which is hypermetabolic.  CT neck 10/25/2017- progressedbilateral subclavian lymphadenopathy since February, right greater than left. Conspicuous enlargement of the small lymph nodes at the right level 2 and 3 nodal stations.  CT chest/abdomen/pelvis 10/25/2017-progressive adenopathy identified within the left axilla, bilateral supraclavicular stations and left inguinal region. Persistent but improved right axillary adenopathy. Scattered indeterminate low-attenuation foci within the spleen not seen on study from 05/30/2017.  Excisional biopsy of right supraclavicular and posterior triangle lymph nodes on7/25/2019-diffuse large B-cell lymphoma, CD20 positive  PET scan 11/29/5782-ONG hypermetabolic lymph nodes and progressive hypermetabolism in existing lymph nodes. Hypermetabolic focus along the anterior aspect of the right scapula with adjacent soft tissue mass. No new adenopathy involving the mediastinal or hilar regions of the chest or the abdomen or pelvis. No obvious involvement of the liver or spleen.  IPI score 3  Cycle 1  CHOP/Rituxan 12/04/2017  Cycle 2 CHOP/Rituxan 12/27/2017  Cycle  3 CHOP/Rituxan 01/16/2018  Cycle 4 CHOP/Rituxan 02/05/2018  PET 02/19/2018- resolution of previously noted hypermetabolic adenopathy, no residual hypermetabolic osseous lesions  Cycle 5 CHOP/rituximab 02/27/2018  Cycle 6 CHOP/rituximab 03/20/2018 2. Hypertension  3. Hypothyroid 4. Hypercholesterolemia 5. Family history significant for colon cancer, stomach cancer and brain cancer 6. Ongoing tobacco use 7. Port-A-Cath placement 12/01/2017, Interventional Radiology 8. Left arm edema-negative venous Doppler 02/26/2018   Disposition: Kendra Ball appears stable.  She is in clinical remission from non-Hodgkin's lymphoma.    She has slight asymmetry of the left forehead/frontal eminence.  She will return in 3 weeks to reevaluate.  She will contact the office for a sooner appointment with any changes in this area.    [name] ANP/GNP-BC   06/06/2018  3:32 PM

## 2018-06-07 ENCOUNTER — Ambulatory Visit (INDEPENDENT_AMBULATORY_CARE_PROVIDER_SITE_OTHER): Payer: Medicare Other | Admitting: Psychiatry

## 2018-06-07 ENCOUNTER — Encounter (HOSPITAL_COMMUNITY): Payer: Self-pay | Admitting: Psychiatry

## 2018-06-07 ENCOUNTER — Telehealth: Payer: Self-pay | Admitting: Nurse Practitioner

## 2018-06-07 DIAGNOSIS — F1020 Alcohol dependence, uncomplicated: Secondary | ICD-10-CM | POA: Diagnosis not present

## 2018-06-07 DIAGNOSIS — F5105 Insomnia due to other mental disorder: Secondary | ICD-10-CM

## 2018-06-07 DIAGNOSIS — F411 Generalized anxiety disorder: Secondary | ICD-10-CM

## 2018-06-07 DIAGNOSIS — T65224A Toxic effect of tobacco cigarettes, undetermined, initial encounter: Secondary | ICD-10-CM

## 2018-06-07 DIAGNOSIS — F99 Mental disorder, not otherwise specified: Secondary | ICD-10-CM

## 2018-06-07 DIAGNOSIS — F331 Major depressive disorder, recurrent, moderate: Secondary | ICD-10-CM | POA: Diagnosis not present

## 2018-06-07 MED ORDER — BUPROPION HCL ER (SR) 150 MG PO TB12: 150.0000 mg | ORAL_TABLET | Freq: Two times a day (BID) | ORAL | 0 refills | Status: DC

## 2018-06-07 MED ORDER — VENLAFAXINE HCL ER 75 MG PO CP24: 75.0000 mg | ORAL_CAPSULE | Freq: Every day | ORAL | 0 refills | Status: DC

## 2018-06-07 MED ORDER — BUSPIRONE HCL 10 MG PO TABS: 10.0000 mg | ORAL_TABLET | Freq: Three times a day (TID) | ORAL | 0 refills | Status: DC

## 2018-06-07 MED ORDER — TRAZODONE HCL 100 MG PO TABS: 200.0000 mg | ORAL_TABLET | Freq: Every day | ORAL | 0 refills | Status: DC

## 2018-06-07 NOTE — Telephone Encounter (Signed)
Scheduled appt per 2/12 los - sent reminder letter in the mail.

## 2018-06-07 NOTE — Progress Notes (Signed)
[place] MD/PA/NP OP Progress Note  06/07/2018 9:33 AM Kendra Ball  MRN:  [id number]  Chief Complaint:  Chief Complaint    Follow-up; Medication Refill     HPI: Kendra Ball tells me that she is doing well.  She found out she was in remission after Thanksgiving.  She is extremely grateful that the chemo was over.  She still has her port and hopes that it will be removed soon.  She continues to see her oncologist about every 6 weeks.  Due to her low weight trending downward she is being monitored more closely by her oncologist.  Rey tells me that she eats small meals throughout the day but is not very hungry.  Her sleep is good.  She is denying all symptoms of depression.  She states that she feels happy.  She is denying anxiety.  She has no cravings for alcohol and is still talking to her sponsor frequently.  Her energy is starting to improve so she has been more motivated and has been able to socialize and do more around the house.  Kendra Ball remembers how difficult her life was when she was experiencing extreme depression and anxiety and is very happy that she is no longer in that place.   Visit Diagnosis:    ICD-10-CM   1. Toxic effect of tobacco cigarette, undetermined intent, initial encounter T65.224A buPROPion (WELLBUTRIN SR) 150 MG 12 hr tablet  2. Alcohol use disorder, moderate, dependence (HCC) F10.20 busPIRone (BUSPAR) 10 MG tablet    traZODone (DESYREL) 100 MG tablet  3. MDD (major depressive disorder), recurrent episode, moderate (HCC) F33.1 busPIRone (BUSPAR) 10 MG tablet    traZODone (DESYREL) 100 MG tablet    venlafaxine XR (EFFEXOR-XR) 75 MG 24 hr capsule  4. GAD (generalized anxiety disorder) F41.1 busPIRone (BUSPAR) 10 MG tablet    venlafaxine XR (EFFEXOR-XR) 75 MG 24 hr capsule  5. Insomnia due to other mental disorder F51.05 traZODone (DESYREL) 100 MG tablet   F99       Past Psychiatric History:  Anxiety: Yes Bipolar Disorder: No Depression: Yes Mania:  No Psychosis: No Schizophrenia: No Personality Disorder: No Hospitalization for psychiatric illness: No History of Electroconvulsive Shock Therapy: No Prior Suicide Attempts: No   Past Medical History:  Past Medical History:  Diagnosis Date  . Adenopathy    right supraclavicular  . Anxiety   . Arthritis   . COPD (chronic obstructive pulmonary disease) ([place])   . DEPRESSION 08/26/2008  . Hx of colonic polyps    First noted on  colonoscopy 2012  . Hypercholesteremia   . HYPERTENSION 08/26/2008  . HYPOTHYROIDISM 08/26/2008  . Obesity   . Osteoporosis   . Substance abuse [facility])    inpatient and outpatient tx for substance abuse  . TOBACCO USER 12/25/2009    Past Surgical History:  Procedure Laterality Date  . AXILLARY LYMPH NODE BIOPSY Right 06/22/2017   Procedure: RIGHT AXILLARY LYMPH NODE BIOPSY;  Surgeon: [name], MD;  Location: [facility];  Service: General;  Laterality: Right;  . [place] SURGERY  2006  . CESAREAN SECTION    . COLON RESECTION N/A 05/09/2014   Procedure: LAPAROSCOPIC HAND-ASSISTED EXTENDED RIGHT COLECTOMY, LAPAROSCOPIC LYSIS OF ADHESIONS, SPLENIC FLEXURE MOBILIZATION.;  Surgeon: [name], MD;  Location: [facility];  Service: General;  Laterality: N/A;  . DILATION AND CURETTAGE OF UTERUS     x2  . FOREARM FRACTURE SURGERY     right  . GASTRIC BYPASS  2006  . IR IMAGING GUIDED PORT  INSERTION  12/01/2017  . MASS EXCISION Right 02/24/2017   Procedure: EXCISIONAL BIOPSY RIGHT SUPRA-CLAVICULAR NODE;  Surgeon: [name], MD;  Location: [facility];  Service: ENT;  Laterality: Right;  . TONSILLECTOMY      Family Psychiatric History: Family History  Problem Relation Age of Onset  . Cancer Mother        colon  . Dementia Mother   . Depression Mother   . Heart disease Father 70  . Heart disease Sister 53       stents, pacemaker  . Depression Sister   . Heart disease Brother 18       CABG, MI  . Depression Brother   . Obesity Daughter   .  Suicidality Daughter   . Bipolar disorder Daughter   . Obesity Son   . Mental illness Daughter        bipolar  . Obesity Daughter   . Depression Sister   . Dementia Paternal Aunt   . Alcohol abuse Maternal Uncle   . Colon cancer Maternal Aunt   . Stomach cancer Maternal Grandfather     Social History:  Social History   Socioeconomic History  . Marital status: Married    Spouse name: Not on file  . Number of children: 3  . Years of education: Not on file  . Highest education level: Not on file  Occupational History  . Occupation: RETIRED    Employer: [employer]  Social Needs  . Financial resource strain: Not on file  . Food insecurity:    Worry: Not on file    Inability: Not on file  . Transportation needs:    Medical: Not on file    Non-medical: Not on file  Tobacco Use  . Smoking status: Current Every Day Smoker    Packs/day: 1.00    Years: 50.00    Pack years: 50.00    Types: Cigarettes  . Smokeless tobacco: Never Used  Substance and Sexual Activity  . Alcohol use: No    Comment: quit on Aug 27, 2015  . Drug use: No    Frequency: 20.0 times per week    Comment: last used May 2017  . Sexual activity: Not Currently    Partners: Male    Birth control/protection: Post-menopausal  Lifestyle  . Physical activity:    Days per week: Not on file    Minutes per session: Not on file  . Stress: Not on file  Relationships  . Social connections:    Talks on phone: Not on file    Gets together: Not on file    Attends religious service: Not on file    Active member of club or organization: Not on file    Attends meetings of clubs or organizations: Not on file    Relationship status: Not on file  Other Topics Concern  . Not on file  Social History Narrative   Kendra Ball lives in [place] with her husband. She has 3 grown children and several grand children. She has joint custody of 3 of her grand children as her daughter has bipolar disorder and has  needed assistance with children in past.    Allergies:  Allergies  Allergen Reactions  . Morphine Sulfate Swelling and Other (See Comments)    Swelling around injection area    Metabolic Disorder Labs: Lab Results  Component Value Date   HGBA1C 5.2 12/06/2016   MPG 111 05/10/2014   No results found for: PROLACTIN Lab  Results  Component Value Date   CHOL 150 04/10/2018   TRIG 101.0 04/10/2018   HDL 67.90 04/10/2018   CHOLHDL 2 04/10/2018   VLDL 20.2 04/10/2018   LDLCALC 62 04/10/2018   LDLCALC 74 12/06/2016   Lab Results  Component Value Date   TSH 1.40 04/10/2018   TSH 0.65 12/06/2016    Therapeutic Level Labs: No results found for: LITHIUM No results found for: VALPROATE No components found for:  CBMZ  Current Medications: Current Outpatient Medications  Medication Sig Dispense Refill  . albuterol (PROVENTIL HFA;VENTOLIN HFA) 108 (90 Base) MCG/ACT inhaler Inhale 2 puffs into the lungs every 6 (six) hours as needed for wheezing or shortness of breath. 1 Inhaler 2  . budesonide-formoterol (SYMBICORT) 160-4.5 MCG/ACT inhaler Inhale 2 puffs into the lungs 2 (two) times daily. 1 Inhaler 6  . buPROPion (WELLBUTRIN SR) 150 MG 12 hr tablet Take 1 tablet (150 mg total) by mouth 2 (two) times daily. 180 tablet 0  . busPIRone (BUSPAR) 10 MG tablet Take 1 tablet (10 mg total) by mouth 3 (three) times daily. 270 tablet 0  . Cyanocobalamin (VITAMIN B-12 PO) Take 1,000 mg by mouth daily.     . ferrous fumarate (HEMOCYTE - 106 MG FE) 325 (106 FE) MG TABS Take 1 tablet by mouth daily. Reported on 10/06/2015    . levothyroxine (SYNTHROID, LEVOTHROID) 100 MCG tablet Take 1 tablet (100 mcg total) by mouth daily before breakfast. 90 tablet 3  . Magnesium 250 MG TABS Take 250 mg by mouth 2 (two) times daily.     . simvastatin (ZOCOR) 10 MG tablet Take 1 tablet (10 mg total) by mouth at bedtime. 90 tablet 3  . traZODone (DESYREL) 100 MG tablet Take 2 tablets (200 mg total) by mouth at  bedtime. 180 tablet 0  . venlafaxine XR (EFFEXOR-XR) 75 MG 24 hr capsule Take 1 capsule (75 mg total) by mouth daily with breakfast. 90 capsule 0   No current facility-administered medications for this visit.      Musculoskeletal: Strength & Muscle Tone: within normal limits Gait & Station: normal Patient leans: N/A  Psychiatric Specialty Exam: Review of Systems  Constitutional: Negative for chills, fever and malaise/fatigue.  Musculoskeletal: Negative for back pain, joint pain and myalgias.       Ongoing leg pain    Blood pressure 98/62, pulse 77, height 5\' 4"  (1.626 m), weight 149 lb (67.6 kg), SpO2 94 %.Body mass index is 25.58 kg/m.  General Appearance: Fairly Groomed  Eye Contact:  Good  Speech:  Clear and Coherent and Normal Rate  Volume:  Normal  Mood:  Euthymic  Affect:  Full Range  Thought Process:  Goal Directed and Descriptions of Associations: Intact  Orientation:  Full (Time, Place, and Person)  Thought Content:  Logical  Suicidal Thoughts:  No  Homicidal Thoughts:  No  Memory:  Immediate;   Good  Judgement:  Good  Insight:  Good  Psychomotor Activity:  Normal  Concentration:  Concentration: Good  Recall:  Good  Fund of Knowledge:  Good  Language:  Good  Akathisia:  No  Handed:  Right  AIMS (if indicated):     Assets:  Communication Skills Desire for Improvement Financial Resources/Insurance [place] Talents/Skills Transportation  ADL's:  Intact  Cognition:  WNL  Sleep:   good         Screenings: AUDIT     Counselor from 09/10/2015 in [facility]  Alcohol Use  Disorder Identification Test Final Score (AUDIT)  34    CAGE-AID     Counselor from 09/10/2015 in [facility] [place] Score  4    GAD-7     Office Visit from 04/10/2018 in [facility] Counselor from 09/14/2015 in [facility]  Total GAD-7 Score  5  7    PHQ2-9     Office Visit from 04/10/2018 in [facility] Office Visit from 02/16/2017 in [facility] Office Visit from 12/12/2016 in [facility] from 11/05/2015 in [facility] Office Visit from 10/16/2015 in [facility]  PHQ-2 Total Score  0  0  2  2  0  PHQ-9 Total Score  6  -  6  9  -      I reviewed the information below on 06/07/2018 and have updated it Assessment and Plan: MDD-recurrent, moderate; GAD; insomnia; alcohol use disorder in remission; nicotine dependence     Medication management with supportive therapy. Risks and benefits, side effects and alternative treatment options discussed with patient. Pt was given an opportunity to ask questions about medication, illness, and treatment. All current psychiatric medications have been reviewed and discussed with the patient and adjusted as clinically appropriate. The patient has been provided an accurate and updated list of the medications being now prescribed. Patient expressed understanding of how their medications were to be used.  Pt verbalized understanding and verbal consent obtained for treatment.  The risk of un-intended pregnancy is low based on the fact that pt reports she is post menopausal. Pt is aware that these meds carry a teratogenic risk. Pt will discuss plan of action if she does or plans to become pregnant in the future.  Status of current problems: doing well  Meds:  Effexor XR 75 mg p.o. daily for MDD and GAD Wellbutrin SR 150 mg p.o. twice daily for MDD.  It is not effective for smoking cessation Trazodone 200 mg p.o. nightly for insomnia BuSpar 10 mg p.o. 3 times daily for GAD "I think this is the perfect combination and it has been for a long time".   Labs: I reviewed the labs done on 04/24/2018 which show CMP within normal limits, CBC shows  decrease in RBCs and an increase in MVC's  Therapy: brief supportive therapy provided. Discussed psychosocial stressors in detail.     Consultations: Encouraged to follow up with PCP and continue to work closely with her oncologist q6 weeks Oncologist is tracking pt's weight   Pt denies SI and is at an acute low risk for suicide. Patient told to call clinic if any problems occur. Patient advised to go to ER if they should develop SI/HI, side effects, or if symptoms worsen. Has crisis numbers to call if needed. Pt verbalized understanding.  F/up in 3 months or sooner if needed  The duration of this appointment visit was 20 minutes of face-to-face time with the patient.  Greater than 50% of this time was spent in counseling, explanation of  diagnosis, planning of further management, and coordination of care     [name], MD 06/07/2018, 9:33 AM

## 2018-06-25 ENCOUNTER — Telehealth: Payer: Self-pay

## 2018-06-25 ENCOUNTER — Encounter: Payer: Self-pay | Admitting: Nurse Practitioner

## 2018-06-25 ENCOUNTER — Inpatient Hospital Stay: Payer: Medicare Other | Attending: Hematology and Oncology | Admitting: Nurse Practitioner

## 2018-06-25 VITALS — BP 111/54 | HR 77 | Temp 97.5°F | Resp 19 | Ht 64.0 in | Wt 149.9 lb

## 2018-06-25 DIAGNOSIS — C8338 Diffuse large B-cell lymphoma, lymph nodes of multiple sites: Secondary | ICD-10-CM

## 2018-06-25 DIAGNOSIS — F17210 Nicotine dependence, cigarettes, uncomplicated: Secondary | ICD-10-CM

## 2018-06-25 DIAGNOSIS — Z800 Family history of malignant neoplasm of digestive organs: Secondary | ICD-10-CM | POA: Insufficient documentation

## 2018-06-25 DIAGNOSIS — E7800 Pure hypercholesterolemia, unspecified: Secondary | ICD-10-CM

## 2018-06-25 DIAGNOSIS — M7989 Other specified soft tissue disorders: Secondary | ICD-10-CM | POA: Insufficient documentation

## 2018-06-25 DIAGNOSIS — I10 Essential (primary) hypertension: Secondary | ICD-10-CM | POA: Insufficient documentation

## 2018-06-25 DIAGNOSIS — Z808 Family history of malignant neoplasm of other organs or systems: Secondary | ICD-10-CM

## 2018-06-25 DIAGNOSIS — E039 Hypothyroidism, unspecified: Secondary | ICD-10-CM | POA: Diagnosis not present

## 2018-06-25 DIAGNOSIS — R634 Abnormal weight loss: Secondary | ICD-10-CM | POA: Insufficient documentation

## 2018-06-25 NOTE — Telephone Encounter (Signed)
TC to vascular [phone]) to schedule doppler for pt left arm. Doppler scheduled for march 4th 2020 @10 :00am at [facility]. Pt called [phone]) and made aware of appointment date and time.  No further problems or concerns at this time.

## 2018-06-25 NOTE — Progress Notes (Signed)
[facility] OFFICE PROGRESS NOTE   Diagnosis: Non-Hodgkin's lymphoma  INTERVAL HISTORY:   Kendra Ball returns as scheduled.  She continues to feel well.  No change in the prominence of the left forehead.  No fevers or sweats.  Appetite varies.  She has lost some weight since her last visit 3 weeks ago.  She continues to note swelling of the left arm.  Objective:  Vital signs in last 24 hours:  Blood pressure (!) 111/54, pulse 77, temperature (!) 97.5 F (36.4 C), temperature source Oral, resp. rate 19, height 5\' 4"  (1.626 m), weight 149 lb 14.4 oz (68 kg), SpO2 95 %.    HEENT: Slight prominence left frontal region/eminence. Lymphatics: No palpable cervical, supraclavicular or left axillary lymph nodes. Resp: Lungs clear bilaterally. Cardio: Regular rate and rhythm. GI: Abdomen soft and nontender.  No hepatosplenomegaly. Vascular: No leg edema.  Trace to 1+ edema with mild erythema at the left lower arm.  Port-A-Cath without erythema.  Lab Results:  Lab Results  Component Value Date   WBC 6.1 04/24/2018   HGB 13.1 04/24/2018   HCT 40.5 04/24/2018   MCV 105.2 (H) 04/24/2018   PLT 227 04/24/2018   NEUTROABS 4.7 04/24/2018    Imaging:  No results found.  Medications: I have reviewed the patient's current medications.  Assessment/Plan: 1. Non-Hodgkin's lymphoma, diffuse large B-cell lymphoma, CD20 positive  Ultrasound of the neck 02/18/2017-right supraclavicular adenopathy with at least 3 nodes, largest measuring 1.3 x 1.1 x 1.4 cm; 2 adjacent hypoechoic right occipital lymph nodes  Excisional biopsy right supraclavicular lymphadenopathy 79/0/2409 (Dr. Deetta Perla lymphoid proliferation. Necrotic soft tissue with associated acute and chronic inflammation.By flow cytometry no monoclonal B-cell or phenotypically aberrant T-cell population. Special stains for microorganisms including AFB, GMS, PAS and Warthin-Starry, are negative.  CT chest  05/30/2017- multiple enlarged right axillary lymph nodes. Largest right axillary lymph node measures 2.6 x 2.2 cm. Single enlarged left axillary lymph node measuring 16 mm.  CT neck 06/03/2017-known bilateral axillary lymphadenopathy partially covered on the scan. No lymphadenopathy seen in the neck.  Biopsy right axillary lymph node 06/22/2017(Dr. Tsuei)- fibroadipose tissue with necrosis, chronic inflammation and granulation tissue  PET scan 07/19/2017-hypermetabolic bilateral axillary lymph nodes. Hypermetabolic but small right supraclavicular lymph nodes. In the right omentum there is a small focus of rim density and internal fat density which is hypermetabolic.  CT neck 10/25/2017- progressedbilateral subclavian lymphadenopathy since February, right greater than left. Conspicuous enlargement of the small lymph nodes at the right level 2 and 3 nodal stations.  CT chest/abdomen/pelvis 10/25/2017-progressive adenopathy identified within the left axilla, bilateral supraclavicular stations and left inguinal region. Persistent but improved right axillary adenopathy. Scattered indeterminate low-attenuation foci within the spleen not seen on study from 05/30/2017.  Excisional biopsy of right supraclavicular and posterior triangle lymph nodes on7/25/2019-diffuse large B-cell lymphoma, CD20 positive  PET scan 10/26/5327-JME hypermetabolic lymph nodes and progressive hypermetabolism in existing lymph nodes. Hypermetabolic focus along the anterior aspect of the right scapula with adjacent soft tissue mass. No new adenopathy involving the mediastinal or hilar regions of the chest or the abdomen or pelvis. No obvious involvement of the liver or spleen.  IPI score 3  Cycle 1 CHOP/Rituxan 12/04/2017  Cycle 2 CHOP/Rituxan 12/27/2017  Cycle 3 CHOP/Rituxan 01/16/2018  Cycle 4 CHOP/Rituxan 02/05/2018  PET 02/19/2018- resolution of previously noted hypermetabolic adenopathy, no residual hypermetabolic  osseous lesions  Cycle 5 CHOP/rituximab 02/27/2018  Cycle 6 CHOP/rituximab 03/20/2018 2. Hypertension  3. Hypothyroid 4. Hypercholesterolemia 5.  Family history significant for colon cancer, stomach cancer and brain cancer 6. Ongoing tobacco use 7. Port-A-Cath placement 12/01/2017, Interventional Radiology 8. Left arm edema-negative venous Doppler 02/26/2018   Disposition: Kendra Ball remains in clinical remission from non-Hodgkin's lymphoma.  The slight prominence at the left frontal region is unchanged and is likely a benign finding.  She has persistent edema of the left arm.  She had a negative venous Doppler 02/26/2018.  We are referring her for a repeat venous Doppler.  She is unable to have this done until 06/27/2018.  Further plans pending that result.  We scheduled a follow-up visit in 3 to 4 weeks, sooner if needed.  Patient seen with Dr. Benay Spice.  25 minutes were spent face-to-face at today's visit with the majority of that time involved in counseling/coordination of care.    [name] ANP/GNP-BC   06/25/2018  9:09 AM This was a shared visit with [name].  Kendra Ball was interviewed and examined.  She continues to have swelling of the left lower arm.  There is mild erythema and induration.  We referred her for a repeat Doppler of the left upper extremity.  We will consider a vascular referral if this study is negative.  Julieanne Manson, MD

## 2018-06-26 ENCOUNTER — Telehealth: Payer: Self-pay | Admitting: Oncology

## 2018-06-26 NOTE — Telephone Encounter (Signed)
Scheduled appt per 3/2 los - pt aware of appt date and time

## 2018-06-27 ENCOUNTER — Ambulatory Visit (HOSPITAL_COMMUNITY)
Admission: RE | Admit: 2018-06-27 | Discharge: 2018-06-27 | Disposition: A | Discharge status: Home / Self Care | Payer: Medicare Other | Source: Ambulatory Visit | Attending: Nurse Practitioner | Admitting: Nurse Practitioner

## 2018-06-27 DIAGNOSIS — C8338 Diffuse large B-cell lymphoma, lymph nodes of multiple sites: Secondary | ICD-10-CM | POA: Diagnosis present

## 2018-06-27 NOTE — Progress Notes (Signed)
Left upper extremity venous duplex exam completed. Result attempted to call ordering physician's office twice, no answers. Please see preliminary notes on CV PROC under chart review.  Tykia Mellone H Laquandra Carrillo(RDMS RVT) 06/27/18 11:10 AM

## 2018-06-28 ENCOUNTER — Other Ambulatory Visit: Payer: Self-pay | Admitting: Nurse Practitioner

## 2018-06-28 DIAGNOSIS — C8338 Diffuse large B-cell lymphoma, lymph nodes of multiple sites: Secondary | ICD-10-CM

## 2018-06-29 ENCOUNTER — Telehealth: Payer: Self-pay | Admitting: *Deleted

## 2018-06-29 NOTE — Telephone Encounter (Signed)
-----   Message from Owens Shark, NP sent at 06/28/2018  8:28 AM EST ----- Please let her know the Doppler study was negative for a blood clot.  Dr. Benay Spice recommends a referral to vascular surgery to evaluate the arm swelling.

## 2018-06-29 NOTE — Telephone Encounter (Signed)
TCT patient with results of Doppler study done earlier this week. Spoke with patient and informed her that study was negative.  Also informed her that a referral to Vascular Surgery was done yesterday to evaluate the swelling in her arm.  Advised her to expect a call from that office to schedule appt. Pt voiced understanding. No further questions or concerns

## 2018-07-01 ENCOUNTER — Other Ambulatory Visit: Payer: Self-pay | Admitting: Radiology

## 2018-07-03 ENCOUNTER — Ambulatory Visit (HOSPITAL_COMMUNITY)
Admission: RE | Admit: 2018-07-03 | Discharge: 2018-07-03 | Disposition: A | Discharge status: Home / Self Care | Payer: Medicare Other | Source: Ambulatory Visit | Attending: Nurse Practitioner | Admitting: Nurse Practitioner

## 2018-07-03 ENCOUNTER — Encounter (HOSPITAL_COMMUNITY): Payer: Self-pay

## 2018-07-03 ENCOUNTER — Other Ambulatory Visit: Payer: Self-pay

## 2018-07-03 ENCOUNTER — Ambulatory Visit (HOSPITAL_COMMUNITY)
Admission: RE | Admit: 2018-07-03 | Discharge: 2018-07-03 | Disposition: A | Discharge status: Home / Self Care | Payer: Medicare Other | Source: Ambulatory Visit | Attending: Oncology | Admitting: Oncology

## 2018-07-03 DIAGNOSIS — Z79890 Hormone replacement therapy (postmenopausal): Secondary | ICD-10-CM | POA: Diagnosis not present

## 2018-07-03 DIAGNOSIS — F329 Major depressive disorder, single episode, unspecified: Secondary | ICD-10-CM | POA: Diagnosis not present

## 2018-07-03 DIAGNOSIS — Z452 Encounter for adjustment and management of vascular access device: Secondary | ICD-10-CM | POA: Insufficient documentation

## 2018-07-03 DIAGNOSIS — M810 Age-related osteoporosis without current pathological fracture: Secondary | ICD-10-CM | POA: Insufficient documentation

## 2018-07-03 DIAGNOSIS — C8338 Diffuse large B-cell lymphoma, lymph nodes of multiple sites: Secondary | ICD-10-CM | POA: Insufficient documentation

## 2018-07-03 DIAGNOSIS — F17210 Nicotine dependence, cigarettes, uncomplicated: Secondary | ICD-10-CM | POA: Diagnosis not present

## 2018-07-03 DIAGNOSIS — E039 Hypothyroidism, unspecified: Secondary | ICD-10-CM | POA: Diagnosis not present

## 2018-07-03 DIAGNOSIS — J449 Chronic obstructive pulmonary disease, unspecified: Secondary | ICD-10-CM | POA: Insufficient documentation

## 2018-07-03 DIAGNOSIS — I10 Essential (primary) hypertension: Secondary | ICD-10-CM | POA: Diagnosis not present

## 2018-07-03 DIAGNOSIS — Z79899 Other long term (current) drug therapy: Secondary | ICD-10-CM | POA: Diagnosis not present

## 2018-07-03 HISTORY — PX: IR REMOVAL TUN ACCESS W/ PORT W/O FL MOD SED: IMG2290

## 2018-07-03 LAB — CBC WITH DIFFERENTIAL/PLATELET
Abs Immature Granulocytes: 0.01 10*3/uL (ref 0.00–0.07)
Basophils Absolute: 0 10*3/uL (ref 0.0–0.1)
Basophils Relative: 1 %
Eosinophils Absolute: 0.1 10*3/uL (ref 0.0–0.5)
Eosinophils Relative: 2 %
HCT: 44.6 % (ref 36.0–46.0)
Hemoglobin: 13.7 g/dL (ref 12.0–15.0)
Immature Granulocytes: 0 %
Lymphocytes Relative: 14 %
Lymphs Abs: 0.7 10*3/uL (ref 0.7–4.0)
MCH: 31.9 pg (ref 26.0–34.0)
MCHC: 30.7 g/dL (ref 30.0–36.0)
MCV: 103.7 fL — ABNORMAL HIGH (ref 80.0–100.0)
Monocytes Absolute: 0.5 10*3/uL (ref 0.1–1.0)
Monocytes Relative: 10 %
Neutro Abs: 3.5 10*3/uL (ref 1.7–7.7)
Neutrophils Relative %: 73 %
Platelets: 226 10*3/uL (ref 150–400)
RBC: 4.3 MIL/uL (ref 3.87–5.11)
RDW: 11.8 % (ref 11.5–15.5)
WBC: 4.8 10*3/uL (ref 4.0–10.5)
nRBC: 0 % (ref 0.0–0.2)

## 2018-07-03 LAB — BASIC METABOLIC PANEL
Anion gap: 8 (ref 5–15)
BUN: 10 mg/dL (ref 8–23)
CO2: 25 mmol/L (ref 22–32)
Calcium: 9.1 mg/dL (ref 8.9–10.3)
Chloride: 107 mmol/L (ref 98–111)
Creatinine, Ser: 0.58 mg/dL (ref 0.44–1.00)
GFR calc Af Amer: >60 mL/min (ref >60)
GFR calc non Af Amer: >60 mL/min (ref >60)
Glucose, Bld: 87 mg/dL (ref 70–99)
Potassium: 3.7 mmol/L (ref 3.5–5.1)
Sodium: 140 mmol/L (ref 135–145)

## 2018-07-03 LAB — PROTIME-INR
INR: 0.9 (ref 0.8–1.2)
Prothrombin Time: 11.5 seconds (ref 11.4–15.2)

## 2018-07-03 MED ORDER — LIDOCAINE-EPINEPHRINE (PF) 1 %-1:200000 IJ SOLN
INTRAMUSCULAR | Status: AC | PRN
  Administered 2018-07-03: 10 mL

## 2018-07-03 MED ORDER — FENTANYL CITRATE (PF) 100 MCG/2ML IJ SOLN
INTRAMUSCULAR | Status: AC | PRN
  Administered 2018-07-03 (×2): 50 ug via INTRAVENOUS

## 2018-07-03 MED ORDER — SODIUM CHLORIDE 0.9 % IV SOLN
INTRAVENOUS | Status: DC
  Administered 2018-07-03: 13:00:00 via INTRAVENOUS

## 2018-07-03 MED ORDER — MIDAZOLAM HCL 2 MG/2ML IJ SOLN
INTRAMUSCULAR | Status: AC | PRN
  Administered 2018-07-03 (×2): 1 mg via INTRAVENOUS

## 2018-07-03 MED ORDER — CEFAZOLIN SODIUM-DEXTROSE 2-4 GM/100ML-% IV SOLN
INTRAVENOUS | Status: AC
  Administered 2018-07-03: 2 g via INTRAVENOUS
  Filled 2018-07-03: qty 100

## 2018-07-03 MED ORDER — LIDOCAINE-EPINEPHRINE (PF) 2 %-1:200000 IJ SOLN
INTRAMUSCULAR | Status: AC
  Filled 2018-07-03: qty 20

## 2018-07-03 MED ORDER — FENTANYL CITRATE (PF) 100 MCG/2ML IJ SOLN
INTRAMUSCULAR | Status: AC
  Filled 2018-07-03: qty 2

## 2018-07-03 MED ORDER — MIDAZOLAM HCL 2 MG/2ML IJ SOLN
INTRAMUSCULAR | Status: AC
  Filled 2018-07-03: qty 2

## 2018-07-03 MED ORDER — CEFAZOLIN SODIUM-DEXTROSE 2-4 GM/100ML-% IV SOLN
2.0000 g | INTRAVENOUS | Status: AC
  Administered 2018-07-03: 2 g via INTRAVENOUS

## 2018-07-03 NOTE — Discharge Instructions (Signed)
Implanted Port Removal, Care After This sheet gives you information about how to care for yourself after your procedure. Your health care provider may also give you more specific instructions. If you have problems or questions, contact your health care provider. What can I expect after the procedure? After the procedure, it is common to have:  Soreness or pain near your incision.  Some swelling or bruising near your incision. Follow these instructions at home: Medicines  Take over-the-counter and prescription medicines only as told by your health care provider.  If you were prescribed an antibiotic medicine, take it as told by your health care provider. Do not stop taking the antibiotic even if you start to feel better. Bathing  Do not take baths, swim, or use a hot tub until your health care provider approves. Ask your health care provider if you can take showers. You may only be allowed to take sponge baths. Incision care   Follow instructions from your health care provider about how to take care of your incision. Make sure you: ? Wash your hands with soap and water before you change your bandage (dressing). If soap and water are not available, use hand sanitizer. ? Change your dressing as told by your health care provider. May remove dressing and shower or bathe in 24 to 48 hours (thurs am).  Keep site clean and dry and replace bandaid as necessary. Look at site daily and report signs of infection. ? Leave stitches (sutures), skin glue, or adhesive strips in place. These skin closures may need to stay in place for 2 weeks or longer. If adhesive strip edges start to loosen and curl up, you may trim the loose edges. Do not remove adhesive strips completely unless your health care provider tells you to do that.  Check your incision area every day for signs of infection. Check for: ? More redness, swelling, or pain. ? More fluid or blood. ? Warmth. ? Pus or a bad smell. Driving   Do  not drive for 24 hours if you were given a medicine to help you relax (sedative) during your procedure.  If you did not receive a sedative, ask your health care provider when it is safe to drive. Activity  Return to your normal activities as told by your health care provider. Ask your health care provider what activities are safe for you.  Do not lift anything that is heavier than 10 lb (4.5 kg), or the limit that you are told, until your health care provider says that it is safe.  Do not do activities that involve lifting your arms over your head. General instructions  Do not use any products that contain nicotine or tobacco, such as cigarettes and e-cigarettes. These can delay healing. If you need help quitting, ask your health care provider.  Keep all follow-up visits as told by your health care provider. This is important. Contact a health care provider if:  You have more redness, swelling, or pain around your incision.  You have more fluid or blood coming from your incision.  Your incision feels warm to the touch.  You have pus or a bad smell coming from your incision.  You have pain that is not relieved by your pain medicine. Get help right away if you have:  A fever or chills.  Chest pain.  Difficulty breathing. Summary  After the procedure, it is common to have pain, soreness, swelling, or bruising near your incision.  If you were prescribed an antibiotic medicine,  take it as told by your health care provider. Do not stop taking the antibiotic even if you start to feel better.  Do not drive for 24 hours if you were given a sedative during your procedure.  Return to your normal activities as told by your health care provider. Ask your health care provider what activities are safe for you. This information is not intended to replace advice given to you by your health care provider. Make sure you discuss any questions you have with your health care provider. Document  Released: 03/23/2015 Document Revised: 05/25/2017 Document Reviewed: 05/25/2017 Elsevier Interactive Patient Education  2019 [place]. Moderate Conscious Sedation, Adult, Care After These instructions provide you with information about caring for yourself after your procedure. Your health care provider may also give you more specific instructions. Your treatment has been planned according to current medical practices, but problems sometimes occur. Call your health care provider if you have any problems or questions after your procedure. What can I expect after the procedure? After your procedure, it is common:  To feel sleepy for several hours.  To feel clumsy and have poor balance for several hours.  To have poor judgment for several hours.  To vomit if you eat too soon. Follow these instructions at home: For at least 24 hours after the procedure:   Do not: ? Participate in activities where you could fall or become injured. ? Drive. ? Use heavy machinery. ? Drink alcohol. ? Take sleeping pills or medicines that cause drowsiness. ? Make important decisions or sign legal documents. ? Take care of children on your own.  Rest. Eating and drinking  Follow the diet recommended by your health care provider.  If you vomit: ? Drink water, juice, or soup when you can drink without vomiting. ? Make sure you have little or no nausea before eating solid foods. General instructions  Have a responsible adult stay with you until you are awake and alert.  Take over-the-counter and prescription medicines only as told by your health care provider.  If you smoke, do not smoke without supervision.  Keep all follow-up visits as told by your health care provider. This is important. Contact a health care provider if:  You keep feeling nauseous or you keep vomiting.  You feel light-headed.  You develop a rash.  You have a fever. Get help right away if:  You have trouble  breathing. This information is not intended to replace advice given to you by your health care provider. Make sure you discuss any questions you have with your health care provider. Document Released: 01/30/2013 Document Revised: 09/14/2015 Document Reviewed: 08/01/2015 Elsevier Interactive Patient Education  2019 [employer].

## 2018-07-03 NOTE — H&P (Signed)
Chief Complaint: Patient was seen in consultation today for port removal at the request of [place] A  Referring Physician(s): Kuneff,Renee A  Supervising Physician: Daryll Brod  Patient Status: [place]  History of Present Illness: Kendra Ball is a 73 y.o. female with hx of lymphoma. She had port placed in 11/2017 and has completed treated and is considered in remission. She is referred for port removal today. PMHx, meds, labs, imaging, allergies reviewed. Feels well, no recent fevers, chills, illness. Has been NPO today as directed. Family at bedside.     Past Medical History:  Diagnosis Date  . Adenopathy    right supraclavicular  . Anxiety   . Arthritis   . COPD (chronic obstructive pulmonary disease) ([place])   . DEPRESSION 08/26/2008  . Hx of colonic polyps    First noted on  colonoscopy 2012  . Hypercholesteremia   . HYPERTENSION 08/26/2008  . HYPOTHYROIDISM 08/26/2008  . Obesity   . Osteoporosis   . Substance abuse [facility])    inpatient and outpatient tx for substance abuse  . TOBACCO USER 12/25/2009    Past Surgical History:  Procedure Laterality Date  . AXILLARY LYMPH NODE BIOPSY Right 06/22/2017   Procedure: RIGHT AXILLARY LYMPH NODE BIOPSY;  Surgeon: [name], MD;  Location: [facility];  Service: General;  Laterality: Right;  . [place] SURGERY  2006  . CESAREAN SECTION    . COLON RESECTION N/A 05/09/2014   Procedure: LAPAROSCOPIC HAND-ASSISTED EXTENDED RIGHT COLECTOMY, LAPAROSCOPIC LYSIS OF ADHESIONS, SPLENIC FLEXURE MOBILIZATION.;  Surgeon: [name], MD;  Location: [facility];  Service: General;  Laterality: N/A;  . DILATION AND CURETTAGE OF UTERUS     x2  . FOREARM FRACTURE SURGERY     right  . GASTRIC BYPASS  2006  . IR IMAGING GUIDED PORT INSERTION  12/01/2017  . MASS EXCISION Right 02/24/2017   Procedure: EXCISIONAL BIOPSY RIGHT SUPRA-CLAVICULAR NODE;  Surgeon: [name], MD;  Location: [employer];  Service: ENT;   Laterality: Right;  . TONSILLECTOMY      Allergies: Morphine sulfate  Medications: Prior to Admission medications   Medication Sig Start Date End Date Taking? Authorizing Provider  albuterol (PROVENTIL HFA;VENTOLIN HFA) 108 (90 Base) MCG/ACT inhaler Inhale 2 puffs into the lungs every 6 (six) hours as needed for wheezing or shortness of breath. 04/10/18  Yes Kuneff, Renee A, DO  budesonide-formoterol (SYMBICORT) 160-4.5 MCG/ACT inhaler Inhale 2 puffs into the lungs 2 (two) times daily. 04/10/18  Yes Kuneff, Renee A, DO  buPROPion (WELLBUTRIN SR) 150 MG 12 hr tablet Take 1 tablet (150 mg total) by mouth 2 (two) times daily. 06/07/18  Yes [name], MD  busPIRone (BUSPAR) 10 MG tablet Take 1 tablet (10 mg total) by mouth 3 (three) times daily. 06/07/18  Yes [name], MD  Cyanocobalamin (VITAMIN B-12 PO) Take 1,000 mg by mouth daily.    Yes [provider]  ferrous fumarate (HEMOCYTE - 106 MG FE) 325 (106 FE) MG TABS Take 1 tablet by mouth daily. Reported on 10/06/2015   Yes [provider]  levothyroxine (SYNTHROID, LEVOTHROID) 100 MCG tablet Take 1 tablet (100 mcg total) by mouth daily before breakfast. 04/10/18  Yes Kuneff, Renee A, DO  Magnesium 250 MG TABS Take 250 mg by mouth 2 (two) times daily.    Yes [provider]  simvastatin (ZOCOR) 10 MG tablet Take 1 tablet (10 mg total) by mouth at bedtime. 04/10/18  Yes Kuneff, Renee A, DO  traZODone (DESYREL) 100 MG tablet Take 2 tablets (200 mg total) by mouth at bedtime. 06/07/18  Yes [name], MD  venlafaxine XR (EFFEXOR-XR) 75 MG 24 hr capsule Take 1 capsule (75 mg total) by mouth daily with breakfast. 06/07/18  Yes [name], MD     Family History  Problem Relation Age of Onset  . Cancer Mother        colon  . Dementia Mother   . Depression Mother   . Heart disease Father 81  . Heart disease Sister 23       stents, pacemaker  . Depression Sister   . Heart disease Brother 40        CABG, MI  . Depression Brother   . Obesity Daughter   . Suicidality Daughter   . Bipolar disorder Daughter   . Obesity Son   . Mental illness Daughter        bipolar  . Obesity Daughter   . Depression Sister   . Dementia Paternal Aunt   . Alcohol abuse Maternal Uncle   . Colon cancer Maternal Aunt   . Stomach cancer Maternal Grandfather     Social History   Socioeconomic History  . Marital status: Married    Spouse name: Not on file  . Number of children: 3  . Years of education: Not on file  . Highest education level: Not on file  Occupational History  . Occupation: RETIRED    Employer: [employer]  Social Needs  . Financial resource strain: Not on file  . Food insecurity:    Worry: Not on file    Inability: Not on file  . Transportation needs:    Medical: Not on file    Non-medical: Not on file  Tobacco Use  . Smoking status: Current Every Day Smoker    Packs/day: 1.00    Years: 50.00    Pack years: 50.00    Types: Cigarettes  . Smokeless tobacco: Never Used  Substance and Sexual Activity  . Alcohol use: No    Comment: quit on Aug 27, 2015  . Drug use: No    Frequency: 20.0 times per week    Comment: last used May 2017  . Sexual activity: Not Currently    Partners: Male    Birth control/protection: Post-menopausal  Lifestyle  . Physical activity:    Days per week: Not on file    Minutes per session: Not on file  . Stress: Not on file  Relationships  . Social connections:    Talks on phone: Not on file    Gets together: Not on file    Attends religious service: Not on file    Active member of club or organization: Not on file    Attends meetings of clubs or organizations: Not on file    Relationship status: Not on file  Other Topics Concern  . Not on file  Social History Narrative   Ms. Marzo lives in [place] with her husband. She has 3 grown children and several grand children. She has joint custody of 3 of her grand children as her  daughter has bipolar disorder and has needed assistance with children in past.    Review of Systems: A 12 point ROS discussed and pertinent positives are indicated in the HPI above.  All other systems are negative.  Review of Systems  Vital Signs: BP 131/69   Pulse 70   Temp 98.1 F (36.7 C) (Oral)   Resp 18  SpO2 94%   Physical Exam Constitutional:      Appearance: Normal appearance.  HENT:     Mouth/Throat:     Mouth: Mucous membranes are moist.     Pharynx: Oropharynx is clear.  Cardiovascular:     Rate and Rhythm: Normal rate and regular rhythm.     Heart sounds: Normal heart sounds.  Pulmonary:     Effort: Pulmonary effort is normal.     Breath sounds: Normal breath sounds. No stridor.  Skin:    General: Skin is warm and dry.     Comments: (R)chest port palpable. Well healed incision.  Neurological:     General: No focal deficit present.     Mental Status: She is alert and oriented to person, place, and time.  Psychiatric:        Mood and Affect: Mood normal.        Judgment: Judgment normal.     Labs:  CBC: Recent Labs    02/05/18 1012 02/26/18 0945 03/19/18 0845 04/24/18 0935  WBC 10.8* 8.8 10.0 6.1  HGB 12.4 12.9 12.4 13.1  HCT 38.0 40.6 38.8 40.5  PLT 276 283 279 227    COAGS: Recent Labs    12/01/17 1232  INR 0.91    BMP: Recent Labs    02/05/18 1013 02/26/18 0945 03/19/18 0845 04/24/18 0935  NA 142 142 143 142  K 3.8 4.2 4.0 3.9  CL 106 106 105 105  CO2 29 28 29 26   GLUCOSE 86 88 81 82  BUN 10 7* 5* 10  CALCIUM 9.4 9.5 9.4 9.4  CREATININE 0.50 0.67 0.69 0.66  GFRNONAA >60 >60 >60 >60  GFRAA >60 >60 >60 >60    LIVER FUNCTION TESTS: Recent Labs    02/05/18 1013 02/26/18 0945 03/19/18 0845 04/24/18 0935  BILITOT 0.6 0.5 0.5 0.5  AST 16 15 16 18   ALT 13 13 12 14   ALKPHOS 49 56 61 60  PROT 6.3* 6.5 6.3* 6.3*  ALBUMIN 3.7 3.7 3.6 3.6    TUMOR MARKERS: No results for input(s): AFPTM, CEA, CA199, CHROMGRNA in the  last 8760 hours.  Assessment and Plan: Hx of lymphoma, treatment complete For Port removal today Labs pending Risks and benefits of port removalt was discussed with the patient including, but not limited to bleeding, infection, pneumothorax, or fibrin sheath development and need for additional procedures.  All of the patient's questions were answered, patient is agreeable to proceed. Consent signed and in chart.    Thank you for this interesting consult.  I greatly enjoyed meeting CHARLEIGH CORRENTI and look forward to participating in their care.  A copy of this report was sent to the requesting provider on this date.  Electronically Signed: Ascencion Dike, PA-C 07/03/2018, 1:35 PM   I spent a total of 20 minutes in face to face in clinical consultation, greater than 50% of which was counseling/coordinating care for port removal

## 2018-07-03 NOTE — Procedures (Signed)
Lymphoma, completed Rx  S/p RT IJ PORT REMOVAL  No comp Stable EBL min Full report in pacs

## 2018-07-16 ENCOUNTER — Other Ambulatory Visit: Payer: Self-pay

## 2018-07-16 ENCOUNTER — Telehealth: Payer: Self-pay | Admitting: Vascular Surgery

## 2018-07-16 ENCOUNTER — Inpatient Hospital Stay (HOSPITAL_BASED_OUTPATIENT_CLINIC_OR_DEPARTMENT_OTHER): Payer: Medicare Other | Admitting: Oncology

## 2018-07-16 DIAGNOSIS — F17210 Nicotine dependence, cigarettes, uncomplicated: Secondary | ICD-10-CM

## 2018-07-16 DIAGNOSIS — Z9221 Personal history of antineoplastic chemotherapy: Secondary | ICD-10-CM | POA: Diagnosis not present

## 2018-07-16 DIAGNOSIS — C8338 Diffuse large B-cell lymphoma, lymph nodes of multiple sites: Secondary | ICD-10-CM

## 2018-07-16 NOTE — Telephone Encounter (Signed)
Called the patient regarding our scheduled appointment for 07/24/2018.  Explained that due to corona virus we are attempting to minimize office visits until this has resolved.  She reports that her arm swelling is been present for approximately 4 months and has not changed.  She has a negative venous duplex.  She prefers to wait and I feel that this is appropriate.  We will schedule an appointment in approximately 3 months

## 2018-07-16 NOTE — Progress Notes (Signed)
[place] OFFICE PROGRESS NOTE   Diagnosis: Non-Hodgkin's lymphoma  INTERVAL HISTORY:   Kendra Ball reports stable swelling of the left arm.  No pain.  She feels well.  No fever, night sweats, anorexia, or palpable lymph nodes.  A repeat left upper extremity Doppler on 06/27/2018 revealed no venous thrombosis.  She has been scheduled to see Dr. Donnetta Hutching. The Port-A-Cath was removed 07/03/2018.   Objective: Physical examination-not performed today Lab Results:  Lab Results  Component Value Date   WBC 4.8 07/03/2018   HGB 13.7 07/03/2018   HCT 44.6 07/03/2018   MCV 103.7 (H) 07/03/2018   PLT 226 07/03/2018   NEUTROABS 3.5 07/03/2018    CMP  Lab Results  Component Value Date   NA 140 07/03/2018   K 3.7 07/03/2018   CL 107 07/03/2018   CO2 25 07/03/2018   GLUCOSE 87 07/03/2018   BUN 10 07/03/2018   CREATININE 0.58 07/03/2018   CALCIUM 9.1 07/03/2018   PROT 6.3 (L) 04/24/2018   ALBUMIN 3.6 04/24/2018   AST 18 04/24/2018   ALT 14 04/24/2018   ALKPHOS 60 04/24/2018   BILITOT 0.5 04/24/2018   GFRNONAA >60 07/03/2018   GFRAA >60 07/03/2018    Medications: I have reviewed the patient's current medications.   Assessment/Plan: 1. Non-Hodgkin's lymphoma, diffuse large B-cell lymphoma, CD20 positive  Ultrasound of the neck 02/18/2017-right supraclavicular adenopathy with at least 3 nodes, largest measuring 1.3 x 1.1 x 1.4 cm; 2 adjacent hypoechoic right occipital lymph nodes  Excisional biopsy right supraclavicular lymphadenopathy 29/12/3714 (Dr. Deetta Perla lymphoid proliferation. Necrotic soft tissue with associated acute and chronic inflammation.By flow cytometry no monoclonal B-cell or phenotypically aberrant T-cell population. Special stains for microorganisms including AFB, GMS, PAS and Warthin-Starry, are negative.  CT chest 05/30/2017- multiple enlarged right axillary lymph nodes. Largest right axillary lymph node measures 2.6 x 2.2 cm. Single  enlarged left axillary lymph node measuring 16 mm.  CT neck 06/03/2017-known bilateral axillary lymphadenopathy partially covered on the scan. No lymphadenopathy seen in the neck.  Biopsy right axillary lymph node 06/22/2017(Dr. Tsuei)- fibroadipose tissue with necrosis, chronic inflammation and granulation tissue  PET scan 07/19/2017-hypermetabolic bilateral axillary lymph nodes. Hypermetabolic but small right supraclavicular lymph nodes. In the right omentum there is a small focus of rim density and internal fat density which is hypermetabolic.  CT neck 10/25/2017- progressedbilateral subclavian lymphadenopathy since February, right greater than left. Conspicuous enlargement of the small lymph nodes at the right level 2 and 3 nodal stations.  CT chest/abdomen/pelvis 10/25/2017-progressive adenopathy identified within the left axilla, bilateral supraclavicular stations and left inguinal region. Persistent but improved right axillary adenopathy. Scattered indeterminate low-attenuation foci within the spleen not seen on study from 05/30/2017.  Excisional biopsy of right supraclavicular and posterior triangle lymph nodes on7/25/2019-diffuse large B-cell lymphoma, CD20 positive  PET scan 12/30/7891-[facility] hypermetabolic lymph nodes and progressive hypermetabolism in existing lymph nodes. Hypermetabolic focus along the anterior aspect of the right scapula with adjacent soft tissue mass. No new adenopathy involving the mediastinal or hilar regions of the chest or the abdomen or pelvis. No obvious involvement of the liver or spleen.  IPI score 3  Cycle 1 CHOP/Rituxan 12/04/2017  Cycle 2 CHOP/Rituxan 12/27/2017  Cycle 3 CHOP/Rituxan 01/16/2018  Cycle 4 CHOP/Rituxan 02/05/2018  PET 02/19/2018- resolution of previously noted hypermetabolic adenopathy, no residual hypermetabolic osseous lesions  Cycle 5 CHOP/rituximab 02/27/2018  Cycle 6 CHOP/rituximab 03/20/2018 2. Hypertension  3. Hypothyroid  4. Hypercholesterolemia 5. Family history significant for colon cancer,  stomach cancer and brain cancer 6. Ongoing tobacco use 7. Port-A-Cath placement 12/01/2017, Interventional Radiology, removed 07/03/2018 8. Left arm edema-negative venous Doppler 02/26/2018 and 06/27/2018     Disposition: [name] is in clinical remission from non-Hodgkin's lymphoma.  The etiology of the left arm edema remains unclear.  She plans to see Dr. Donnetta Hutching, but will move the office visit out for 1 month secondary to the current coronavirus pandemic.  Ms. Pfefferkorn will be scheduled for an office visit here in 3 months.  She will contact us in the interim for new symptoms.  Approximately 15 minutes were spent talking to the patient and documenting in the chart today.  Betsy Coder, MD  07/16/2018  11:54 AM

## 2018-07-24 ENCOUNTER — Ambulatory Visit: Payer: Self-pay | Admitting: Vascular Surgery

## 2018-08-08 ENCOUNTER — Other Ambulatory Visit (HOSPITAL_COMMUNITY): Payer: Self-pay | Admitting: Psychiatry

## 2018-08-08 DIAGNOSIS — F331 Major depressive disorder, recurrent, moderate: Secondary | ICD-10-CM

## 2018-08-08 DIAGNOSIS — F411 Generalized anxiety disorder: Secondary | ICD-10-CM

## 2018-09-06 ENCOUNTER — Encounter (HOSPITAL_COMMUNITY): Payer: Self-pay | Admitting: Psychiatry

## 2018-09-06 ENCOUNTER — Other Ambulatory Visit: Payer: Self-pay

## 2018-09-06 ENCOUNTER — Ambulatory Visit (INDEPENDENT_AMBULATORY_CARE_PROVIDER_SITE_OTHER): Payer: Medicare Other | Admitting: Psychiatry

## 2018-09-06 DIAGNOSIS — F1020 Alcohol dependence, uncomplicated: Secondary | ICD-10-CM | POA: Diagnosis not present

## 2018-09-06 DIAGNOSIS — F331 Major depressive disorder, recurrent, moderate: Secondary | ICD-10-CM | POA: Diagnosis not present

## 2018-09-06 DIAGNOSIS — F5105 Insomnia due to other mental disorder: Secondary | ICD-10-CM

## 2018-09-06 DIAGNOSIS — F411 Generalized anxiety disorder: Secondary | ICD-10-CM | POA: Diagnosis not present

## 2018-09-06 DIAGNOSIS — F99 Mental disorder, not otherwise specified: Secondary | ICD-10-CM

## 2018-09-06 DIAGNOSIS — T65224A Toxic effect of tobacco cigarettes, undetermined, initial encounter: Secondary | ICD-10-CM | POA: Diagnosis not present

## 2018-09-06 MED ORDER — VENLAFAXINE HCL ER 75 MG PO CP24: 75.0000 mg | ORAL_CAPSULE | Freq: Every day | ORAL | 0 refills | Status: DC

## 2018-09-06 MED ORDER — TRAZODONE HCL 100 MG PO TABS: 200.0000 mg | ORAL_TABLET | Freq: Every day | ORAL | 0 refills | Status: DC

## 2018-09-06 MED ORDER — BUPROPION HCL ER (SR) 150 MG PO TB12: 150.0000 mg | ORAL_TABLET | Freq: Two times a day (BID) | ORAL | 0 refills | Status: DC

## 2018-09-06 MED ORDER — BUSPIRONE HCL 10 MG PO TABS: 10.0000 mg | ORAL_TABLET | Freq: Three times a day (TID) | ORAL | 0 refills | Status: DC

## 2018-09-06 NOTE — Progress Notes (Signed)
09/06/2018 9:15 AM Kendra Ball  MRN:  [id number]    Virtual Visit via Telephone Note  I connected with Kendra Ball on 09/06/18 at  9:00 AM EDT by telephone and verified that I am speaking with the correct person using two identifiers.  Location: Patient: home Provider: home   I discussed the limitations, risks, security and privacy concerns of performing an evaluation and management service by telephone and the availability of in person appointments. I also discussed with the patient that there may be a patient responsible charge related to this service. The patient expressed understanding and agreed to proceed.    Chief Complaint:  Chief Complaint    Follow-up     HPI: Overall she is doing well and "I have nothing to complain about". She has some down days but denies depression. Sleep is good. Health is good. She has not gone to the store in 2 months and only does pick up groceries. Her family does come to visit but they practice safe social distancing and other safety measures. She also video chats with friends and other family members. Pt denies SI/HI. [place] states she is sleeping about 8 hrs/night with Trazodone. She is not napping but her energy is usually low. She has rare anxiety and when she does she uses coping skills. Kyndel states she feels good and does not want her meds changed.     Visit Diagnosis:    ICD-10-CM   1. Toxic effect of tobacco cigarette, undetermined intent, initial encounter T65.224A buPROPion (WELLBUTRIN SR) 150 MG 12 hr tablet  2. Alcohol use disorder, moderate, dependence (HCC) F10.20 busPIRone (BUSPAR) 10 MG tablet    traZODone (DESYREL) 100 MG tablet  3. MDD (major depressive disorder), recurrent episode, moderate (HCC) F33.1 busPIRone (BUSPAR) 10 MG tablet    traZODone (DESYREL) 100 MG tablet    venlafaxine XR (EFFEXOR-XR) 75 MG 24 hr capsule  4. GAD (generalized anxiety disorder) F41.1 busPIRone (BUSPAR) 10 MG tablet   venlafaxine XR (EFFEXOR-XR) 75 MG 24 hr capsule  5. Insomnia due to other mental disorder F51.05 traZODone (DESYREL) 100 MG tablet   F99       Past Psychiatric History:  Anxiety: Yes Bipolar Disorder: No Depression: Yes Mania: No Psychosis: No Schizophrenia: No Personality Disorder: No Hospitalization for psychiatric illness: No History of Electroconvulsive Shock Therapy: No Prior Suicide Attempts: No   Past Medical History:  Past Medical History:  Diagnosis Date  . Adenopathy    right supraclavicular  . Anxiety   . Arthritis   . COPD (chronic obstructive pulmonary disease) ([place])   . DEPRESSION 08/26/2008  . Hx of colonic polyps    First noted on  colonoscopy 2012  . Hypercholesteremia   . HYPERTENSION 08/26/2008  . HYPOTHYROIDISM 08/26/2008  . Obesity   . Osteoporosis   . Substance abuse [facility])    inpatient and outpatient tx for substance abuse  . TOBACCO USER 12/25/2009    Past Surgical History:  Procedure Laterality Date  . AXILLARY LYMPH NODE BIOPSY Right 06/22/2017   Procedure: RIGHT AXILLARY LYMPH NODE BIOPSY;  Surgeon: [name], MD;  Location: [facility];  Service: General;  Laterality: Right;  . [place] SURGERY  2006  . CESAREAN SECTION    . COLON RESECTION N/A 05/09/2014   Procedure: LAPAROSCOPIC HAND-ASSISTED EXTENDED RIGHT COLECTOMY, LAPAROSCOPIC LYSIS OF ADHESIONS, SPLENIC FLEXURE MOBILIZATION.;  Surgeon: [name], MD;  Location: [facility];  Service: General;  Laterality: N/A;  . DILATION AND CURETTAGE OF UTERUS  x2  . FOREARM FRACTURE SURGERY     right  . GASTRIC BYPASS  2006  . IR IMAGING GUIDED PORT INSERTION  12/01/2017  . IR REMOVAL TUN ACCESS W/ PORT W/O FL MOD SED  07/03/2018  . MASS EXCISION Right 02/24/2017   Procedure: EXCISIONAL BIOPSY RIGHT SUPRA-CLAVICULAR NODE;  Surgeon: [name], MD;  Location: [facility];  Service: ENT;  Laterality: Right;  . TONSILLECTOMY      Family Psychiatric History: Family History  Problem  Relation Age of Onset  . Cancer Mother        colon  . Dementia Mother   . Depression Mother   . Heart disease Father 32  . Heart disease Sister 28       stents, pacemaker  . Depression Sister   . Heart disease Brother 75       CABG, MI  . Depression Brother   . Obesity Daughter   . Suicidality Daughter   . Bipolar disorder Daughter   . Obesity Son   . Mental illness Daughter        bipolar  . Obesity Daughter   . Depression Sister   . Dementia Paternal Aunt   . Alcohol abuse Maternal Uncle   . Colon cancer Maternal Aunt   . Stomach cancer Maternal Grandfather     Social History:  Social History   Socioeconomic History  . Marital status: Married    Spouse name: Not on file  . Number of children: 3  . Years of education: Not on file  . Highest education level: Not on file  Occupational History  . Occupation: RETIRED    Employer: [employer]  Social Needs  . Financial resource strain: Not on file  . Food insecurity:    Worry: Not on file    Inability: Not on file  . Transportation needs:    Medical: Not on file    Non-medical: Not on file  Tobacco Use  . Smoking status: Current Every Day Smoker    Packs/day: 1.00    Years: 50.00    Pack years: 50.00    Types: Cigarettes  . Smokeless tobacco: Never Used  Substance and Sexual Activity  . Alcohol use: No    Comment: quit on Aug 27, 2015  . Drug use: No    Frequency: 20.0 times per week    Comment: last used May 2017  . Sexual activity: Not Currently    Partners: Male    Birth control/protection: Post-menopausal  Lifestyle  . Physical activity:    Days per week: Not on file    Minutes per session: Not on file  . Stress: Not on file  Relationships  . Social connections:    Talks on phone: Not on file    Gets together: Not on file    Attends religious service: Not on file    Active member of club or organization: Not on file    Attends meetings of clubs or organizations: Not on file     Relationship status: Not on file  Other Topics Concern  . Not on file  Social History Narrative   Kendra Ball lives in [place] with her husband. She has 3 grown children and several grand children. She has joint custody of 3 of her grand children as her daughter has bipolar disorder and has needed assistance with children in past.    Allergies:  Allergies  Allergen Reactions  . Morphine Sulfate Swelling and Other (See Comments)  Swelling around injection area    Metabolic Disorder Labs: Lab Results  Component Value Date   HGBA1C 5.2 12/06/2016   MPG 111 05/10/2014   No results found for: PROLACTIN Lab Results  Component Value Date   CHOL 150 04/10/2018   TRIG 101.0 04/10/2018   HDL 67.90 04/10/2018   CHOLHDL 2 04/10/2018   VLDL 20.2 04/10/2018   LDLCALC 62 04/10/2018   LDLCALC 74 12/06/2016   Lab Results  Component Value Date   TSH 1.40 04/10/2018   TSH 0.65 12/06/2016    Therapeutic Level Labs: No results found for: LITHIUM No results found for: VALPROATE No components found for:  CBMZ  Current Medications: Current Outpatient Medications  Medication Sig Dispense Refill  . albuterol (PROVENTIL HFA;VENTOLIN HFA) 108 (90 Base) MCG/ACT inhaler Inhale 2 puffs into the lungs every 6 (six) hours as needed for wheezing or shortness of breath. 1 Inhaler 2  . budesonide-formoterol (SYMBICORT) 160-4.5 MCG/ACT inhaler Inhale 2 puffs into the lungs 2 (two) times daily. 1 Inhaler 6  . buPROPion (WELLBUTRIN SR) 150 MG 12 hr tablet Take 1 tablet (150 mg total) by mouth 2 (two) times daily. 180 tablet 0  . busPIRone (BUSPAR) 10 MG tablet Take 1 tablet (10 mg total) by mouth 3 (three) times daily. 270 tablet 0  . Cyanocobalamin (VITAMIN B-12 PO) Take 1,000 mg by mouth daily.     . ferrous fumarate (HEMOCYTE - 106 MG FE) 325 (106 FE) MG TABS Take 1 tablet by mouth daily. Reported on 10/06/2015    . levothyroxine (SYNTHROID, LEVOTHROID) 100 MCG tablet Take 1 tablet (100 mcg  total) by mouth daily before breakfast. 90 tablet 3  . Magnesium 250 MG TABS Take 250 mg by mouth 2 (two) times daily.     . simvastatin (ZOCOR) 10 MG tablet Take 1 tablet (10 mg total) by mouth at bedtime. 90 tablet 3  . traZODone (DESYREL) 100 MG tablet Take 2 tablets (200 mg total) by mouth at bedtime. 180 tablet 0  . venlafaxine XR (EFFEXOR-XR) 75 MG 24 hr capsule Take 1 capsule (75 mg total) by mouth daily with breakfast. 90 capsule 0   No current facility-administered medications for this visit.      MSE: Raffaela was pleasant, calm and cooperative on the phone today.  She was appropriately engaged in the conversation.  Attention and concentration were good.  Speech was clear and coherent.  Speech was with normal tone, volume and rate.  Thought processes are linear, goal oriented and intact.  Thought content is logical.  She denies SI/HI.  She denies auditory and visual hallucinations and did not appear to be responding to internal stimuli.  Attention and concentration were good.  Fund of knowledge and use of language were good.  Insight and judgment were good.  I am unable to comment on physical appearance, hygiene, eye contact or psychomotor activity as I was unable to see the patient physically.         Screenings: AUDIT     Counselor from 09/10/2015 in [facility]  Alcohol Use Disorder Identification Test Final Score (AUDIT)  34    CAGE-AID     Counselor from 09/10/2015 in [facility] Score  4    GAD-7     Office Visit from 04/10/2018 in [facility] Counselor from 09/14/2015 in [facility]  Total GAD-7 Score  5  7    PHQ2-9  Office Visit from 04/10/2018 in [facility] Office Visit from 02/16/2017 in [facility] Office Visit from 12/12/2016 in [facility] Counselor from  11/05/2015 in [facility] Office Visit from 10/16/2015 in [facility]  PHQ-2 Total Score  0  0  2  2  0  PHQ-9 Total Score  6  -  6  9  -      I reviewed the information below on 09/06/2018 and have updated it Assessment and Plan: MDD-recurrent, moderate; GAD; insomnia; alcohol use disorder in remission; nicotine dependence     Medication management with supportive therapy. Risks and benefits, side effects and alternative treatment options discussed with patient. Pt was given an opportunity to ask questions about medication, illness, and treatment. All current psychiatric medications have been reviewed and discussed with the patient and adjusted as clinically appropriate. The patient has been provided an accurate and updated list of the medications being now prescribed. Patient expressed understanding of how their medications were to be used.  Pt verbalized understanding and verbal consent obtained for treatment.  The risk of un-intended pregnancy is low based on the fact that pt reports she is post menopausal. Pt is aware that these meds carry a teratogenic risk. Pt will discuss plan of action if she does or plans to become pregnant in the future.  Status of current problems: stable  Meds:  Effexor XR 75 mg p.o. daily for MDD and GAD Wellbutrin SR 150 mg p.o. twice daily for MDD.  It is not effective for smoking cessation Trazodone 200 mg p.o. nightly for insomnia BuSpar 10 mg p.o. 3 times daily for GAD    Labs: none ordered today  Therapy: brief supportive therapy provided. Discussed psychosocial stressors in detail.     Consultations: Encouraged to follow up with PCP and continue to work closely with her oncologist q6 weeks Oncologist is tracking pt's weight   Pt denies SI and is at an acute low risk for suicide. Patient told to call clinic if any problems occur. Patient advised to go to ER if they should develop SI/HI, side  effects, or if symptoms worsen. Has crisis numbers to call if needed. Pt verbalized understanding.  F/up in 3 months or sooner if needed  I provided 15 minutes of non-face-to-face time during this encounter     [name], MD 09/06/2018, 9:15 AM

## 2018-09-10 ENCOUNTER — Telehealth (HOSPITAL_COMMUNITY): Payer: Self-pay | Admitting: Rehabilitation

## 2018-09-10 NOTE — Telephone Encounter (Signed)
The above patient or their representative was contacted and gave the following answers to these questions:         Do you have any of the following symptoms? No  Fever                    Cough                   Shortness of breath  Do  you have any of the following other symptoms? No   muscle pain         vomiting,        diarrhea        rash         weakness        red eye        abdominal pain         bruising          bruising or bleeding              joint pain           severe headache    Have you been in contact with someone who was or has been sick in the past 2 weeks? No  Yes                 Unsure                         Unable to assess   Does the person that you were in contact with have any of the following symptoms?   Cough         shortness of breath           muscle pain         vomiting,            diarrhea            rash            weakness           fever            red eye           abdominal pain           bruising  or  bleeding                joint pain                severe headache               Have you  or someone you have been in contact with traveled internationally in th last month? No        If yes, which countries?   Have you  or someone you have been in contact with traveled outside [place] in th last month? No         If yes, which state and city?   COMMENTS OR ACTION PLAN FOR THIS PATIENT:          

## 2018-09-11 ENCOUNTER — Other Ambulatory Visit: Payer: Self-pay

## 2018-09-11 ENCOUNTER — Encounter: Payer: Self-pay | Admitting: Vascular Surgery

## 2018-09-11 ENCOUNTER — Ambulatory Visit: Payer: Self-pay | Admitting: Vascular Surgery

## 2018-09-11 ENCOUNTER — Ambulatory Visit (INDEPENDENT_AMBULATORY_CARE_PROVIDER_SITE_OTHER): Payer: Medicare Other | Admitting: Vascular Surgery

## 2018-09-11 VITALS — BP 110/67 | HR 86 | Temp 97.7°F | Resp 18 | Ht 64.0 in | Wt 146.7 lb

## 2018-09-11 DIAGNOSIS — M7989 Other specified soft tissue disorders: Secondary | ICD-10-CM | POA: Diagnosis not present

## 2018-09-11 NOTE — Progress Notes (Signed)
[facility]ist of [place]  Patient name: Kendra Ball MRN: [id number] DOB: 1945/11/24 Sex: female  REASON FOR CONSULT: Evaluation of left arm swelling  HPI: Kendra Ball is a 73 y.o. female, who is here today for evaluation discussion of left arm swelling.  She is very pleasant 36-year-old with a history of lymphoma acting multiple regions.  She has CT follow-up showing progression of adenopathy in both axilla and inguinal regions.  Orts that she has had progressive history of swelling in her left arm.  This does wax and wane throughout the day and from 1 day to another.  She has no history of DVT.  She has had axillary node biopsy on the right not on the left.  Past Medical History:  Diagnosis Date  . Adenopathy    right supraclavicular  . Anxiety   . Arthritis   . COPD (chronic obstructive pulmonary disease) ([place])   . DEPRESSION 08/26/2008  . Hx of colonic polyps    First noted on  colonoscopy 2012  . Hypercholesteremia   . HYPERTENSION 08/26/2008  . HYPOTHYROIDISM 08/26/2008  . Obesity   . Osteoporosis   . Substance abuse [facility])    inpatient and outpatient tx for substance abuse  . TOBACCO USER 12/25/2009    Family History  Problem Relation Age of Onset  . Cancer Mother        colon  . Dementia Mother   . Depression Mother   . Heart disease Father 17  . Heart disease Sister 67       stents, pacemaker  . Depression Sister   . Heart disease Brother 64       CABG, MI  . Depression Brother   . Obesity Daughter   . Suicidality Daughter   . Bipolar disorder Daughter   . Obesity Son   . Mental illness Daughter        bipolar  . Obesity Daughter   . Depression Sister   . Dementia Paternal Aunt   . Alcohol abuse Maternal Uncle   . Colon cancer Maternal Aunt   . Stomach cancer Maternal Grandfather     SOCIAL HISTORY: Social History   Socioeconomic History  . Marital status: Married    Spouse name: Not on file   . Number of children: 3  . Years of education: Not on file  . Highest education level: Not on file  Occupational History  . Occupation: RETIRED    Employer: [employer]  Social Needs  . Financial resource strain: Not on file  . Food insecurity:    Worry: Not on file    Inability: Not on file  . Transportation needs:    Medical: Not on file    Non-medical: Not on file  Tobacco Use  . Smoking status: Current Every Day Smoker    Packs/day: 1.00    Years: 50.00    Pack years: 50.00    Types: Cigarettes  . Smokeless tobacco: Never Used  Substance and Sexual Activity  . Alcohol use: No    Comment: quit on Aug 27, 2015  . Drug use: No    Frequency: 20.0 times per week    Comment: last used May 2017  . Sexual activity: Not Currently    Partners: Male    Birth control/protection: Post-menopausal  Lifestyle  . Physical activity:    Days per week: Not on file    Minutes per session: Not on file  . Stress: Not on  file  Relationships  . Social connections:    Talks on phone: Not on file    Gets together: Not on file    Attends religious service: Not on file    Active member of club or organization: Not on file    Attends meetings of clubs or organizations: Not on file    Relationship status: Not on file  . Intimate partner violence:    Fear of current or ex partner: Not on file    Emotionally abused: Not on file    Physically abused: Not on file    Forced sexual activity: Not on file  Other Topics Concern  . Not on file  Social History Narrative   Ms. Guerin lives in [place] with her husband. She has 3 grown children and several grand children. She has joint custody of 3 of her grand children as her daughter has bipolar disorder and has needed assistance with children in past.    Allergies  Allergen Reactions  . Morphine Sulfate Swelling and Other (See Comments)    Swelling around injection area    Current Outpatient Medications  Medication Sig Dispense  Refill  . albuterol (PROVENTIL HFA;VENTOLIN HFA) 108 (90 Base) MCG/ACT inhaler Inhale 2 puffs into the lungs every 6 (six) hours as needed for wheezing or shortness of breath. 1 Inhaler 2  . budesonide-formoterol (SYMBICORT) 160-4.5 MCG/ACT inhaler Inhale 2 puffs into the lungs 2 (two) times daily. 1 Inhaler 6  . buPROPion (WELLBUTRIN SR) 150 MG 12 hr tablet Take 1 tablet (150 mg total) by mouth 2 (two) times daily. 180 tablet 0  . busPIRone (BUSPAR) 10 MG tablet Take 1 tablet (10 mg total) by mouth 3 (three) times daily. 270 tablet 0  . Cyanocobalamin (VITAMIN B-12 PO) Take 1,000 mg by mouth daily.     . ferrous fumarate (HEMOCYTE - 106 MG FE) 325 (106 FE) MG TABS Take 1 tablet by mouth daily. Reported on 10/06/2015    . levothyroxine (SYNTHROID, LEVOTHROID) 100 MCG tablet Take 1 tablet (100 mcg total) by mouth daily before breakfast. 90 tablet 3  . Magnesium 250 MG TABS Take 250 mg by mouth 2 (two) times daily.     . simvastatin (ZOCOR) 10 MG tablet Take 1 tablet (10 mg total) by mouth at bedtime. 90 tablet 3  . traZODone (DESYREL) 100 MG tablet Take 2 tablets (200 mg total) by mouth at bedtime. 180 tablet 0  . venlafaxine XR (EFFEXOR-XR) 75 MG 24 hr capsule Take 1 capsule (75 mg total) by mouth daily with breakfast. 90 capsule 0   No current facility-administered medications for this visit.     REVIEW OF SYSTEMS:  [X]  denotes positive finding, [ ]  denotes negative finding Cardiac  Comments:  Chest pain or chest pressure:    Shortness of breath upon exertion:    Short of breath when lying flat:    Irregular heart rhythm:        Vascular    Pain in calf, thigh, or hip brought on by ambulation:    Pain in feet at night that wakes you up from your sleep:     Blood clot in your veins:    Leg swelling:         Pulmonary    Oxygen at home:    Productive cough:     Wheezing:         Neurologic    Sudden weakness in arms or legs:     Sudden numbness in arms  or legs:     Sudden onset of  difficulty speaking or slurred speech:    Temporary loss of vision in one eye:     Problems with dizziness:         Gastrointestinal    Blood in stool:     Vomited blood:         Genitourinary    Burning when urinating:     Blood in urine:        Psychiatric    Major depression:         Hematologic    Bleeding problems:    Problems with blood clotting too easily:        Skin    Rashes or ulcers:        Constitutional    Fever or chills:      PHYSICAL EXAM: Vitals:   09/11/18 1050  BP: 110/67  Pulse: 86  Resp: 18  Temp: 97.7 F (36.5 C)  SpO2: 95%  Weight: 146 lb 11.2 oz (66.5 kg)  Height: 5\' 4"  (1.626 m)    GENERAL: The patient is a well-nourished female, in no acute distress. The vital signs are documented above. CARDIOVASCULAR: 2+ radial pulses bilaterally.  No pitting edema on the left or right arm. PULMONARY: There is good air exchange  ABDOMEN: Soft and non-tender  MUSCULOSKELETAL: There are no major deformities or cyanosis. NEUROLOGIC: No focal weakness or paresthesias are detected. SKIN: There are no ulcers or rashes noted. PSYCHIATRIC: The patient has a normal affect.  DATA:  Left upper extremity duplex from 06/27/2018 was reviewed.  This revealed no evidence of DVT  MEDICAL ISSUES: Clinically this appears to be lymphedema.  The patient reports that she can have swelling onto her dorsum of her hand and also her fingers.  I feel that this is related lymphadenopathy from her leukemia.  Explained that this may be progressive but it certainly has not been to date.  I explained the only treatment option would be graduated compression garments.  She has mild swelling currently and therefore would not recommend compression unless this is progressive.  Reports that her husband has to wear lower extremity compression garments due to venous stasis disease of the shoes fit major with the concept.  She is reassured with this discussion will see [place] again on an as-needed  basis   Rosetta Posner, MD [facility] Ve[facility] Specialists of [facility] Tel [phone] Pager [phone]

## 2018-10-16 ENCOUNTER — Inpatient Hospital Stay: Payer: Medicare Other | Attending: Oncology | Admitting: Oncology

## 2018-10-16 ENCOUNTER — Telehealth: Payer: Self-pay | Admitting: *Deleted

## 2018-10-16 ENCOUNTER — Other Ambulatory Visit: Payer: Self-pay

## 2018-10-16 VITALS — BP 133/57 | HR 87 | Temp 98.5°F | Resp 18 | Ht 64.0 in | Wt 148.4 lb

## 2018-10-16 DIAGNOSIS — Z800 Family history of malignant neoplasm of digestive organs: Secondary | ICD-10-CM | POA: Insufficient documentation

## 2018-10-16 DIAGNOSIS — C8330 Diffuse large B-cell lymphoma, unspecified site: Secondary | ICD-10-CM | POA: Diagnosis not present

## 2018-10-16 DIAGNOSIS — I890 Lymphedema, not elsewhere classified: Secondary | ICD-10-CM

## 2018-10-16 DIAGNOSIS — E039 Hypothyroidism, unspecified: Secondary | ICD-10-CM | POA: Diagnosis not present

## 2018-10-16 DIAGNOSIS — I10 Essential (primary) hypertension: Secondary | ICD-10-CM | POA: Insufficient documentation

## 2018-10-16 DIAGNOSIS — E7800 Pure hypercholesterolemia, unspecified: Secondary | ICD-10-CM | POA: Diagnosis not present

## 2018-10-16 DIAGNOSIS — C8338 Diffuse large B-cell lymphoma, lymph nodes of multiple sites: Secondary | ICD-10-CM

## 2018-10-16 NOTE — Progress Notes (Signed)
[place] OFFICE PROGRESS NOTE   Diagnosis: Non-Hodgkin's lymphoma  INTERVAL HISTORY:   Kendra Ball returns for a scheduled visit.  She feels well.  Good appetite.  No fever or night sweats.  No palpable lymph nodes.  She has intermittent swelling in the left arm.  She saw Dr. Donnetta Hutching.  He feels the left arm swelling is related to lymphedema.  Objective:  Vital signs in last 24 hours:  Blood pressure (!) 133/57, pulse 87, temperature 98.5 F (36.9 C), temperature source Oral, resp. rate 18, height 5\' 4"  (1.626 m), weight 148 lb 6.4 oz (67.3 kg), SpO2 97 %.    HEENT: Neck without mass Lymphatics: No cervical, supraclavicular, axillary, or inguinal nodes GI: No mass, no hepatosplenomegaly, nontender Vascular: No leg edema, mild edema of the left lower arm   Lab Results:  Lab Results  Component Value Date   WBC 4.8 07/03/2018   HGB 13.7 07/03/2018   HCT 44.6 07/03/2018   MCV 103.7 (H) 07/03/2018   PLT 226 07/03/2018   NEUTROABS 3.5 07/03/2018    CMP  Lab Results  Component Value Date   NA 140 07/03/2018   K 3.7 07/03/2018   CL 107 07/03/2018   CO2 25 07/03/2018   GLUCOSE 87 07/03/2018   BUN 10 07/03/2018   CREATININE 0.58 07/03/2018   CALCIUM 9.1 07/03/2018   PROT 6.3 (L) 04/24/2018   ALBUMIN 3.6 04/24/2018   AST 18 04/24/2018   ALT 14 04/24/2018   ALKPHOS 60 04/24/2018   BILITOT 0.5 04/24/2018   GFRNONAA >60 07/03/2018   GFRAA >60 07/03/2018     Medications: I have reviewed the patient's current medications.   Assessment/Plan: 1. Non-Hodgkin's lymphoma, diffuse large B-cell lymphoma, CD20 positive  Ultrasound of the neck 02/18/2017-right supraclavicular adenopathy with at least 3 nodes, largest measuring 1.3 x 1.1 x 1.4 cm; 2 adjacent hypoechoic right occipital lymph nodes  Excisional biopsy right supraclavicular lymphadenopathy 42/06/5359 (Dr. Deetta Perla lymphoid proliferation. Necrotic soft tissue with associated acute and  chronic inflammation.By flow cytometry no monoclonal B-cell or phenotypically aberrant T-cell population. Special stains for microorganisms including AFB, GMS, PAS and Warthin-Starry, are negative.  CT chest 05/30/2017- multiple enlarged right axillary lymph nodes. Largest right axillary lymph node measures 2.6 x 2.2 cm. Single enlarged left axillary lymph node measuring 16 mm.  CT neck 06/03/2017-known bilateral axillary lymphadenopathy partially covered on the scan. No lymphadenopathy seen in the neck.  Biopsy right axillary lymph node 06/22/2017(Dr. Tsuei)- fibroadipose tissue with necrosis, chronic inflammation and granulation tissue  PET scan 07/19/2017-hypermetabolic bilateral axillary lymph nodes. Hypermetabolic but small right supraclavicular lymph nodes. In the right omentum there is a small focus of rim density and internal fat density which is hypermetabolic.  CT neck 10/25/2017- progressedbilateral subclavian lymphadenopathy since February, right greater than left. Conspicuous enlargement of the small lymph nodes at the right level 2 and 3 nodal stations.  CT chest/abdomen/pelvis 10/25/2017-progressive adenopathy identified within the left axilla, bilateral supraclavicular stations and left inguinal region. Persistent but improved right axillary adenopathy. Scattered indeterminate low-attenuation foci within the spleen not seen on study from 05/30/2017.  Excisional biopsy of right supraclavicular and posterior triangle lymph nodes on7/25/2019-diffuse large B-cell lymphoma, CD20 positive  PET scan 07/27/3152-MGQ hypermetabolic lymph nodes and progressive hypermetabolism in existing lymph nodes. Hypermetabolic focus along the anterior aspect of the right scapula with adjacent soft tissue mass. No new adenopathy involving the mediastinal or hilar regions of the chest or the abdomen or pelvis. No obvious involvement of  the liver or spleen.  IPI score 3  Cycle 1 CHOP/Rituxan  12/04/2017  Cycle 2 CHOP/Rituxan 12/27/2017  Cycle 3 CHOP/Rituxan 01/16/2018  Cycle 4 CHOP/Rituxan 02/05/2018  PET 02/19/2018- resolution of previously noted hypermetabolic adenopathy, no residual hypermetabolic osseous lesions  Cycle 5 CHOP/rituximab 02/27/2018  Cycle 6 CHOP/rituximab 03/20/2018 2. Hypertension  3. Hypothyroid 4. Hypercholesterolemia 5. Family history significant for colon cancer, stomach cancer and brain cancer 6. Ongoing tobacco use 7. Port-A-Cath placement 12/01/2017, Interventional Radiology 8. Left arm edema-negative venous Doppler 02/26/2018     Disposition: Kendra Ball is in remission from non-Hodgkin's lymphoma.  She will return for an office visit in 4 months.  She has persistent mild edema of the left arm.  This is most likely related to lymphedema following treatment for lymphoma.  15 minutes were spent with the patient today.  The majority of the time was used for counseling and coordination of care.  Betsy Coder, MD  10/16/2018  12:19 PM

## 2018-10-16 NOTE — Telephone Encounter (Signed)
Patient brought to exam room by NT in W/C after a [facility] staff member witnessed that she had fallen outside of the [place] on her way back to her car. She has a small superficial abrasion on left cheek with mild bleeding and left ear at ear ring entry point had small amount of bleeding that had stopped. Left hand/wrist area also has two superficial abrasions approximately 1 inch in size. Face/ear abrasions were cleaned with sterile NS with no further bleeding noted. Abrasions on left hand/wrist were cleaned w/sterile NS and dressed w/telpha non-stick dressing and wrapped w/Kerlex. She reports she missed the step down on the curb and twisted ankle slighlty when crossing the drive outside [facility]. Did not feel lightheaded or dizzy at the time. Reports she may have hit left side of her face on curb, but does not think she struck her head on curb. Denies any headache or visual changes. This RN did not note any lumps/bumps on skull. She was able to rotate both ankles without pain and ambulate with steady gait afterwards. Dr. Benay Spice saw patient and determined she is safe to leave. V/S were stable (137/66 P-83, R-16, sat 96%) Discharged home at 1305 in good condition. RN observed her ambulate safely to lobby.

## 2018-10-17 ENCOUNTER — Other Ambulatory Visit: Payer: Self-pay

## 2018-10-17 ENCOUNTER — Telehealth: Payer: Self-pay | Admitting: Oncology

## 2018-10-17 ENCOUNTER — Encounter (HOSPITAL_BASED_OUTPATIENT_CLINIC_OR_DEPARTMENT_OTHER): Payer: Self-pay | Admitting: Emergency Medicine

## 2018-10-17 ENCOUNTER — Emergency Department (HOSPITAL_BASED_OUTPATIENT_CLINIC_OR_DEPARTMENT_OTHER): Payer: Medicare Other

## 2018-10-17 ENCOUNTER — Emergency Department (HOSPITAL_BASED_OUTPATIENT_CLINIC_OR_DEPARTMENT_OTHER)
Admission: EM | Admit: 2018-10-17 | Discharge: 2018-10-17 | Disposition: A | Discharge status: Home / Self Care | Payer: Medicare Other | Attending: Emergency Medicine | Admitting: Emergency Medicine

## 2018-10-17 DIAGNOSIS — S62646A Nondisplaced fracture of proximal phalanx of right little finger, initial encounter for closed fracture: Secondary | ICD-10-CM

## 2018-10-17 DIAGNOSIS — S29011A Strain of muscle and tendon of front wall of thorax, initial encounter: Secondary | ICD-10-CM | POA: Diagnosis not present

## 2018-10-17 DIAGNOSIS — Y998 Other external cause status: Secondary | ICD-10-CM | POA: Insufficient documentation

## 2018-10-17 DIAGNOSIS — J449 Chronic obstructive pulmonary disease, unspecified: Secondary | ICD-10-CM | POA: Diagnosis not present

## 2018-10-17 DIAGNOSIS — S6991XA Unspecified injury of right wrist, hand and finger(s), initial encounter: Secondary | ICD-10-CM | POA: Diagnosis present

## 2018-10-17 DIAGNOSIS — Y92480 Sidewalk as the place of occurrence of the external cause: Secondary | ICD-10-CM | POA: Diagnosis not present

## 2018-10-17 DIAGNOSIS — W101XXA Fall (on)(from) sidewalk curb, initial encounter: Secondary | ICD-10-CM | POA: Insufficient documentation

## 2018-10-17 DIAGNOSIS — Z8572 Personal history of non-Hodgkin lymphomas: Secondary | ICD-10-CM | POA: Insufficient documentation

## 2018-10-17 DIAGNOSIS — E039 Hypothyroidism, unspecified: Secondary | ICD-10-CM | POA: Insufficient documentation

## 2018-10-17 DIAGNOSIS — I10 Essential (primary) hypertension: Secondary | ICD-10-CM | POA: Diagnosis not present

## 2018-10-17 DIAGNOSIS — Z79899 Other long term (current) drug therapy: Secondary | ICD-10-CM | POA: Insufficient documentation

## 2018-10-17 DIAGNOSIS — F17210 Nicotine dependence, cigarettes, uncomplicated: Secondary | ICD-10-CM | POA: Diagnosis not present

## 2018-10-17 DIAGNOSIS — Y9301 Activity, walking, marching and hiking: Secondary | ICD-10-CM | POA: Diagnosis not present

## 2018-10-17 DIAGNOSIS — W19XXXA Unspecified fall, initial encounter: Secondary | ICD-10-CM

## 2018-10-17 NOTE — ED Notes (Signed)
Patient transported to X-ray 

## 2018-10-17 NOTE — ED Triage Notes (Signed)
Pt stepped off the curb yesterday coming out of her oncologist office and fell.  Skinned her knees, cheek, and left hand/wrist.  Was taken back up to doctor's office and was seen for the fall.  Pt presents today for more pain and swelling in her left hand than she expected so she sts, "I think I broke my hand".  Also believes she pulled a muscle under her left arm/axilla area.

## 2018-10-17 NOTE — ED Provider Notes (Signed)
[facility] EMERGENCY DEPARTMENT Provider Note   CSN: [id number] Arrival date & time: 10/17/18  4315    History   Chief Complaint Chief Complaint  Patient presents with  . Hand Injury    HPI Kendra Ball is a 73 y.o. female.     The history is provided by the patient and medical records. No language interpreter was used.  Hand Injury  Kendra Ball is a 73 y.o. female who presents to the Emergency Department complaining of fall. She had a fall when she was leaving the cancer center yesterday. She went to step off the curb and Mr. step and fell, striking her right knee and landing on her left hand as well as striking her left cheek. She had no loss of consciousness. She was able to get up and head home. She presents today because now her left hand is sore and purple. She also has some pain under her left axillary region. Pain is worse with movement and with deep breaths. No known sick contacts. She has no difficulty breathing. She does not take any blood thinners. Denies any headaches. Past Medical History:  Diagnosis Date  . Adenopathy    right supraclavicular  . Anxiety   . Arthritis   . Cancer ([place])   . COPD (chronic obstructive pulmonary disease) ([place])   . DEPRESSION 08/26/2008  . Hx of colonic polyps    First noted on  colonoscopy 2012  . Hypercholesteremia   . HYPERTENSION 08/26/2008  . HYPOTHYROIDISM 08/26/2008  . Lymphoma ([place])   . Obesity   . Osteoporosis   . Substance abuse [facility])    inpatient and outpatient tx for substance abuse  . TOBACCO USER 12/25/2009    Patient Active Problem List   Diagnosis Date Noted  . Port-A-Cath in place 12/27/2017  . Diffuse large B cell lymphoma ([place]) 11/23/2017  . Coronary atherosclerosis 10/22/2015  . Chronic diarrhea 10/13/2015  . History of colon polyps 10/13/2015  . Smoker 09/14/2015  . Insomnia 05/05/2015  . COPD (chronic obstructive pulmonary disease) with chronic bronchitis ([place]) 02/13/2015  .  Pulmonary nodule seen on imaging study 02/13/2015  . Osteoporosis 11/11/2014  . History of gastric bypass 09/10/2014  . Essential hypertension 05/22/2014  . GAD (generalized anxiety disorder) 01/30/2014  . MDD (major depressive disorder), recurrent episode, moderate ([place]) 01/30/2014  . Dyslipidemia 10/03/2013  . Abnormal mammogram 10/01/2013  . Family history of premature coronary heart disease 08/13/2013  . Hypothyroidism 08/26/2008    Past Surgical History:  Procedure Laterality Date  . AXILLARY LYMPH NODE BIOPSY Right 06/22/2017   Procedure: RIGHT AXILLARY LYMPH NODE BIOPSY;  Surgeon: [name], MD;  Location: [facility];  Service: General;  Laterality: Right;  . [place] SURGERY  2006  . CESAREAN SECTION    . COLON RESECTION N/A 05/09/2014   Procedure: LAPAROSCOPIC HAND-ASSISTED EXTENDED RIGHT COLECTOMY, LAPAROSCOPIC LYSIS OF ADHESIONS, SPLENIC FLEXURE MOBILIZATION.;  Surgeon: [name], MD;  [facility];  Service: General;  Laterality: N/A;  . DILATION AND CURETTAGE OF UTERUS     x2  . FOREARM FRACTURE SURGERY     right  . GASTRIC BYPASS  2006  . IR IMAGING GUIDED PORT INSERTION  12/01/2017  . IR REMOVAL TUN ACCESS W/ PORT W/O FL MOD SED  07/03/2018  . MASS EXCISION Right 02/24/2017   Procedure: EXCISIONAL BIOPSY RIGHT SUPRA-CLAVICULAR NODE;  Surgeon: [name], MD;  Location: [facility];  Service: ENT;  Laterality: Right;  . TONSILLECTOMY  OB History   No obstetric history on file.      Home Medications    Prior to Admission medications   Medication Sig Start Date End Date Taking? Authorizing Provider  albuterol (PROVENTIL HFA;VENTOLIN HFA) 108 (90 Base) MCG/ACT inhaler Inhale 2 puffs into the lungs every 6 (six) hours as needed for wheezing or shortness of breath. 04/10/18   Kuneff, Renee A, DO  budesonide-formoterol (SYMBICORT) 160-4.5 MCG/ACT inhaler Inhale 2 puffs into the lungs 2 (two) times daily. 04/10/18   Kuneff, Renee A, DO   buPROPion (WELLBUTRIN SR) 150 MG 12 hr tablet Take 1 tablet (150 mg total) by mouth 2 (two) times daily. 09/06/18   [name], MD  busPIRone (BUSPAR) 10 MG tablet Take 1 tablet (10 mg total) by mouth 3 (three) times daily. 09/06/18   [name], MD  Cyanocobalamin (VITAMIN B-12 PO) Take 1,000 mg by mouth daily.     [provider]  ferrous fumarate (HEMOCYTE - 106 MG FE) 325 (106 FE) MG TABS Take 1 tablet by mouth daily. Reported on 10/06/2015    [provider]  levothyroxine (SYNTHROID, LEVOTHROID) 100 MCG tablet Take 1 tablet (100 mcg total) by mouth daily before breakfast. 04/10/18   Kuneff, Renee A, DO  Magnesium 250 MG TABS Take 250 mg by mouth 2 (two) times daily.     [provider]  Multiple Vitamins-Minerals (VITAMIN D3 COMPLETE PO) Take 1 tablet by mouth daily.    [provider]  simvastatin (ZOCOR) 10 MG tablet Take 1 tablet (10 mg total) by mouth at bedtime. 04/10/18   Kuneff, Renee A, DO  traZODone (DESYREL) 100 MG tablet Take 2 tablets (200 mg total) by mouth at bedtime. 09/06/18   [name], MD  venlafaxine XR (EFFEXOR-XR) 75 MG 24 hr capsule Take 1 capsule (75 mg total) by mouth daily with breakfast. 09/06/18   [name], MD    Family History Family History  Problem Relation Age of Onset  . Cancer Mother        colon  . Dementia Mother   . Depression Mother   . Heart disease Father 57  . Heart disease Sister 73       stents, pacemaker  . Depression Sister   . Heart disease Brother 63       CABG, MI  . Depression Brother   . Obesity Daughter   . Suicidality Daughter   . Bipolar disorder Daughter   . Obesity Son   . Mental illness Daughter        bipolar  . Obesity Daughter   . Depression Sister   . Dementia Paternal Aunt   . Alcohol abuse Maternal Uncle   . Colon cancer Maternal Aunt   . Stomach cancer Maternal Grandfather     Social History Social History   Tobacco Use  . Smoking status: Current Every  Day Smoker    Packs/day: 1.00    Years: 50.00    Pack years: 50.00    Types: Cigarettes  . Smokeless tobacco: Never Used  Substance Use Topics  . Alcohol use: No    Comment: quit on Aug 27, 2015  . Drug use: No    Frequency: 20.0 times per week    Comment: last used May 2017     Allergies   Morphine sulfate   Review of Systems Review of Systems  All other systems reviewed and are negative.    Physical Exam Updated Vital Signs BP 125/61 (BP Location: Right  Arm)   Pulse 77   Temp 98.5 F (36.9 C) (Oral)   Resp 16   Ht 5\' 4"  (1.626 m)   Wt 67.4 kg   SpO2 97%   BMI 25.49 kg/m   Physical Exam Vitals signs and nursing note reviewed.  Constitutional:      Appearance: She is well-developed.  HENT:     Head: Normocephalic.     Comments: Ecchymosis to the left cheek. Eyes:     Extraocular Movements: Extraocular movements intact.     Pupils: Pupils are equal, round, and reactive to light.  Cardiovascular:     Rate and Rhythm: Normal rate and regular rhythm.  Pulmonary:     Effort: Pulmonary effort is normal. No respiratory distress.     Comments: There is mild left axillary chest wall tenderness without overlying ecchymosis. Abdominal:     Palpations: Abdomen is soft.     Tenderness: There is no abdominal tenderness. There is no guarding or rebound.  Musculoskeletal:     Comments: Abrasion to the right knee with flexion extension intact. There is non-pitting edema to the left upper extremity parentheses patient states this is chronic in parentheses. There is ecchymosis and tenderness to the left fifth digit with decreased flexion at the PIP joint of the fifth digit. There is no significant tenderness to the left hand.  Skin:    General: Skin is warm and dry.  Neurological:     Mental Status: She is alert and oriented to person, place, and time.  Psychiatric:        Behavior: Behavior normal.      ED Treatments / Results  Labs (all labs ordered are listed, but  only abnormal results are displayed) Labs Reviewed - No data to display  EKG None  Radiology Dg Ribs Unilateral W/chest Left  Result Date: 10/17/2018 CLINICAL DATA:  Left chest pain since a fall yesterday. Initial encounter. EXAM: LEFT RIBS AND CHEST - 3+ VIEW COMPARISON:  PET CT scan 02/19/2018. FINDINGS: No fracture or other bone lesions are seen involving the ribs. There is no evidence of pneumothorax or pleural effusion. Both lungs are clear. Heart size and mediastinal contours are within normal limits. Atherosclerosis noted. IMPRESSION: Negative for rib fracture.  No acute abnormality. Atherosclerosis. Electronically Signed   By: Inge Rise M.D.   On: 10/17/2018 11:21   Dg Hand Complete Left  Result Date: 10/17/2018 CLINICAL DATA:  Left hand pain after fall yesterday. EXAM: LEFT HAND - COMPLETE 3+ VIEW COMPARISON:  None. FINDINGS: Mildly displaced fracture is seen involving the proximal portion of the fifth proximal phalanx. Severe degenerative changes seen involving the first carpometacarpal joint. No soft tissue abnormality is noted. IMPRESSION: Mildly displaced fifth proximal phalangeal fracture. 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 Electronically Signed   By: Marijo Conception M.D.   On: 10/17/2018 10:55    Procedures Procedures (including critical care time)  Medications Ordered in ED Medications - No data to display   Initial Impression / Assessment and Plan / ED Course  I have reviewed the triage vital signs and the nursing notes.  Pertinent labs & imaging results that were available during my care of the patient were reviewed by me and considered in my medical decision making (see chart for details).        Patient here for evaluation following a mechanical fall that occurred yesterday. She is complaining of pain to her left fifth digit. She does have ecchymosis on examination with decreased  range of motion. Imaging is significant for fifth  proximal phalanx fracture. Will place in a splint with outpatient PCP follow-up, return precautions.  Final Clinical Impressions(s) / ED Diagnoses   Final diagnoses:  Closed nondisplaced fracture of proximal phalanx of right little finger, initial encounter  Fall, initial encounter  Muscle strain of chest wall, initial encounter    ED Discharge Orders    None       Quintella Reichert, MD 10/17/18 1521

## 2018-10-17 NOTE — Telephone Encounter (Signed)
Scheduled per los. Called and left msg. Mailed printout  °

## 2018-10-31 ENCOUNTER — Encounter: Payer: Self-pay | Admitting: Family Medicine

## 2018-10-31 ENCOUNTER — Other Ambulatory Visit: Payer: Self-pay

## 2018-10-31 ENCOUNTER — Ambulatory Visit (INDEPENDENT_AMBULATORY_CARE_PROVIDER_SITE_OTHER): Payer: Medicare Other | Admitting: Family Medicine

## 2018-10-31 VITALS — BP 95/62 | HR 71 | Temp 98.2°F | Resp 17 | Ht 64.0 in | Wt 148.0 lb

## 2018-10-31 DIAGNOSIS — S62617A Displaced fracture of proximal phalanx of left little finger, initial encounter for closed fracture: Secondary | ICD-10-CM | POA: Diagnosis not present

## 2018-10-31 NOTE — Patient Instructions (Signed)
keep in splint for another week until you see the orthopedic. I do believe you need an orthopedic to evaluate it since it did have some mild displacement.

## 2018-10-31 NOTE — Progress Notes (Signed)
Kendra Ball , 05/03/1945, 73 y.o., female MRN: [id number] Patient Care Team    Relationship Specialty Notifications Start End  Ma Hillock, DO PCP - General Family Medicine  12/30/14   Juanita Craver, MD Consulting Physician Gastroenterology  05/09/14   Excell Seltzer, MD Consulting Physician General Surgery  05/09/14   Brandon Melnick, [place]  Psychology  10/16/15   [name], MD  Psychiatry  12/12/16     Chief Complaint  Patient presents with   Follow-up    Pt went to ED after fall. Broken L pinky finger.      Subjective: Pt presents for an OV for follow up on left 5th finger fracture after fall 2 weeks ago. She was seen in the ED and splinted. She denies pain. She reports it has remained mildly swollen and bruised. She has not been able to move it or tried.   Dg Ribs Unilateral W/chest Left  Result Date: 10/17/2018 CLINICAL DATA:  Left chest pain since a fall yesterday. Initial encounter. EXAM: LEFT RIBS AND CHEST - 3+ VIEW COMPARISON:  PET CT scan 02/19/2018. FINDINGS: No fracture or other bone lesions are seen involving the ribs. There is no evidence of pneumothorax or pleural effusion. Both lungs are clear. Heart size and mediastinal contours are within normal limits. Atherosclerosis noted. IMPRESSION: Negative for rib fracture.  No acute abnormality. Atherosclerosis. Electronically Signed   By: Inge Rise M.D.   On: 10/17/2018 11:21   Dg Hand Complete Left  Result Date: 10/17/2018 CLINICAL DATA:  Left hand pain after fall yesterday. EXAM: LEFT HAND - COMPLETE 3+ VIEW COMPARISON:  None. FINDINGS: Mildly displaced fracture is seen involving the proximal portion of the fifth proximal phalanx. Severe degenerative changes seen involving the first carpometacarpal joint. No soft tissue abnormality is noted. IMPRESSION: Mildly displaced fifth proximal phalangeal fracture. 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 Electronically Signed   By: Marijo Conception M.D.    On: 10/17/2018 10:55     Depression screen [facility] 2/9 04/10/2018 02/16/2017 12/12/2016 10/16/2015 09/10/2014  Decreased Interest 0 0 2 0 0  Down, Depressed, Hopeless 0 0 0 0 0  PHQ - 2 Score 0 0 2 0 0  Altered sleeping 0 - 0 - -  Tired, decreased energy 2 - 3 - -  Change in appetite 2 - 0 - -  Feeling bad or failure about yourself  0 - 1 - -  Trouble concentrating 2 - 0 - -  Moving slowly or fidgety/restless 0 - 0 - -  Suicidal thoughts - - 0 - -  PHQ-9 Score 6 - 6 - -  Difficult doing work/chores Not difficult at all - Not difficult at all - -  Some encounter information is confidential and restricted. Go to Review Flowsheets activity to see all data.  Some recent data might be hidden    Allergies  Allergen Reactions   Morphine Sulfate Swelling and Other (See Comments)    Swelling around injection area   Social History   Social History Narrative   Ms. Credeur lives in [place] with her husband. She has 3 grown children and several grand children. She has joint custody of 3 of her grand children as her daughter has bipolar disorder and has needed assistance with children in past.   Past Medical History:  Diagnosis Date   Adenopathy    right supraclavicular   Anxiety    Arthritis    Cancer ([place])    COPD (chronic  obstructive pulmonary disease) ([place])    DEPRESSION 08/26/2008   Hx of colonic polyps    First noted on  colonoscopy 2012   Hypercholesteremia    HYPERTENSION 08/26/2008   HYPOTHYROIDISM 08/26/2008   Lymphoma ([place])    Obesity    Osteoporosis    Substance abuse ([place])    inpatient and outpatient tx for substance abuse   TOBACCO USER 12/25/2009   Past Surgical History:  Procedure Laterality Date   AXILLARY LYMPH NODE BIOPSY Right 06/22/2017   Procedure: RIGHT AXILLARY LYMPH NODE BIOPSY;  Surgeon: [name], MD;  [facility];  Service: General;  Laterality: Right;   BARIATRIC SURGERY  2006   CESAREAN SECTION     COLON RESECTION N/A  05/09/2014   Procedure: LAPAROSCOPIC HAND-ASSISTED EXTENDED RIGHT COLECTOMY, LAPAROSCOPIC LYSIS OF ADHESIONS, SPLENIC FLEXURE MOBILIZATION.;  Surgeon: [name], MD;  [facility];  Service: General;  Laterality: N/A;   [place]     x2   FOREARM FRACTURE SURGERY     right   GASTRIC BYPASS  2006   IR IMAGING GUIDED PORT INSERTION  12/01/2017   IR REMOVAL TUN ACCESS W/ PORT W/O FL MOD SED  07/03/2018   MASS EXCISION Right 02/24/2017   Procedure: EXCISIONAL BIOPSY RIGHT SUPRA-CLAVICULAR NODE;  Surgeon: [name], MD;  Location: [facility];  Service: ENT;  Laterality: Right;   TONSILLECTOMY     Family History  Problem Relation Age of Onset   Cancer Mother        colon   Dementia Mother    Depression Mother    Heart disease Father 68   Heart disease Sister 99       stents, pacemaker   Depression Sister    Heart disease Brother 71       CABG, MI   Depression Brother    Obesity Daughter    Suicidality Daughter    Bipolar disorder Daughter    Obesity Son    Mental illness Daughter        bipolar   Obesity Daughter    Depression Sister    Dementia Paternal Aunt    Alcohol abuse Maternal Uncle    Colon cancer Maternal Aunt    Stomach cancer Maternal Grandfather    Allergies as of 10/31/2018      Reactions   Morphine Sulfate Swelling, Other (See Comments)   Swelling around injection area      Medication List       Accurate as of October 31, 2018  3:03 PM. If you have any questions, ask your nurse or doctor.        albuterol 108 (90 Base) MCG/ACT inhaler Commonly known as: VENTOLIN HFA Inhale 2 puffs into the lungs every 6 (six) hours as needed for wheezing or shortness of breath.   budesonide-formoterol 160-4.5 MCG/ACT inhaler Commonly known as: SYMBICORT Inhale 2 puffs into the lungs 2 (two) times daily.   buPROPion 150 MG 12 hr tablet Commonly known as: WELLBUTRIN SR Take 1 tablet (150 mg total) by  mouth 2 (two) times daily.   busPIRone 10 MG tablet Commonly known as: BUSPAR Take 1 tablet (10 mg total) by mouth 3 (three) times daily.   ferrous fumarate 325 (106 Fe) MG Tabs tablet Commonly known as: HEMOCYTE - 106 mg FE Take 1 tablet by mouth daily. Reported on 10/06/2015   levothyroxine 100 MCG tablet Commonly known as: SYNTHROID Take 1 tablet (100 mcg total) by mouth daily  before breakfast.   Magnesium 250 MG Tabs Take 250 mg by mouth 2 (two) times daily.   simvastatin 10 MG tablet Commonly known as: ZOCOR Take 1 tablet (10 mg total) by mouth at bedtime.   traZODone 100 MG tablet Commonly known as: DESYREL Take 2 tablets (200 mg total) by mouth at bedtime.   venlafaxine XR 75 MG 24 hr capsule Commonly known as: EFFEXOR-XR Take 1 capsule (75 mg total) by mouth daily with breakfast.   VITAMIN B-12 PO Take 1,000 mg by mouth daily.   VITAMIN D3 COMPLETE PO Take 1 tablet by mouth daily.       All past medical history, surgical history, allergies, family history, immunizations andmedications were updated in the EMR today and reviewed under the history and medication portions of their EMR.     ROS: Negative, with the exception of above mentioned in HPI   Objective:  BP 95/62 (BP Location: Right Arm, Patient Position: Sitting, Cuff Size: Normal)    Pulse 71    Temp 98.2 F (36.8 C) (Temporal)    Resp 17    Ht 5\' 4"  (1.626 m)    Wt 148 lb (67.1 kg)    LMP  (Exact Date)    SpO2 95%    BMI 25.40 kg/m  Body mass index is 25.4 kg/m. Gen: Afebrile. No acute distress. Nontoxic in appearance, well developed, well nourished.  HENT: AT. [place]. MMM Eyes:Pupils Equal Round Reactive to light, Extraocular movements intact,  Conjunctiva without redness, discharge or icterus. MSK: left 5th finger with mild swelling proximal phalanx. No pain, Decreased ROM.  Skin: bruising 5th finger, left Neuro:  PERLA. EOMi. Alert. Oriented x3   No exam data present No results found. No results  found for this or any previous visit (from the past 24 hour(s)).  Assessment/Plan: Kendra Ball is a 73 y.o. female present for OV for  Displaced fracture of proximal phalanx of left little finger, initial encounter for closed fracture - originally reported as nondisplaced, however the fracture is mildly displaced very near joint.  - Advised pt to keep her hand in a splint as she has and we ill refer her to ortho to further evaluate and repeat xray to make sure it is healing appropriately.  - overall she is doing well and denies pain.  - Ambulatory referral to Orthopedic Surgery   Reviewed expectations re: course of current medical issues.  Discussed self-management of symptoms.  Outlined signs and symptoms indicating need for more acute intervention.  Patient verbalized understanding and all questions were answered.  Patient received an After-Visit Summary.  > 15 minutes spent with patient, > 50% of that time face to face     No orders of the defined types were placed in this encounter.    Note is dictated utilizing voice recognition software. Although note has been proof read prior to signing, occasional typographical errors still can be missed. If any questions arise, please do not hesitate to call for verification.   electronically signed by:  Howard Pouch, DO  [place]

## 2018-11-02 ENCOUNTER — Telehealth: Payer: Self-pay | Admitting: Acute Care

## 2018-11-02 NOTE — Telephone Encounter (Signed)
Spoke with pt and advised that per Eric Form, NP pt will no longer qualify for lung cancer screening due to dx of lymphoma in 2018 and that she will continue to be followed by Dr Benay Spice. Pt verbalized understanding.  Nothing further needed at this time.

## 2018-11-29 ENCOUNTER — Other Ambulatory Visit: Payer: Self-pay

## 2018-11-29 ENCOUNTER — Ambulatory Visit (INDEPENDENT_AMBULATORY_CARE_PROVIDER_SITE_OTHER): Payer: Medicare Other | Admitting: Psychiatry

## 2018-11-29 ENCOUNTER — Encounter (HOSPITAL_COMMUNITY): Payer: Self-pay | Admitting: Psychiatry

## 2018-11-29 DIAGNOSIS — F331 Major depressive disorder, recurrent, moderate: Secondary | ICD-10-CM

## 2018-11-29 DIAGNOSIS — F411 Generalized anxiety disorder: Secondary | ICD-10-CM

## 2018-11-29 DIAGNOSIS — F5105 Insomnia due to other mental disorder: Secondary | ICD-10-CM | POA: Diagnosis not present

## 2018-11-29 DIAGNOSIS — F1020 Alcohol dependence, uncomplicated: Secondary | ICD-10-CM

## 2018-11-29 DIAGNOSIS — F17200 Nicotine dependence, unspecified, uncomplicated: Secondary | ICD-10-CM

## 2018-11-29 DIAGNOSIS — F99 Mental disorder, not otherwise specified: Secondary | ICD-10-CM

## 2018-11-29 MED ORDER — BUSPIRONE HCL 10 MG PO TABS: 10.0000 mg | ORAL_TABLET | Freq: Three times a day (TID) | ORAL | 0 refills | Status: DC

## 2018-11-29 MED ORDER — TRAZODONE HCL 100 MG PO TABS: 200.0000 mg | ORAL_TABLET | Freq: Every day | ORAL | 0 refills | Status: DC

## 2018-11-29 MED ORDER — VENLAFAXINE HCL ER 150 MG PO CP24: 150.0000 mg | ORAL_CAPSULE | Freq: Every day | ORAL | 0 refills | Status: DC

## 2018-11-29 MED ORDER — BUPROPION HCL ER (SR) 150 MG PO TB12: 150.0000 mg | ORAL_TABLET | Freq: Two times a day (BID) | ORAL | 0 refills | Status: DC

## 2018-11-29 NOTE — Progress Notes (Signed)
Virtual Visit via Telephone Note  I connected with Kendra Ball on 11/29/18 at  1:30 PM EDT by telephone and verified that I am speaking with the correct person using two identifiers.  Location: Patient: home Provider: office   I discussed the limitations, risks, security and privacy concerns of performing an evaluation and management service by telephone and the availability of in person appointments. I also discussed with the patient that there may be a patient responsible charge related to this service. The patient expressed understanding and agreed to proceed.   History of Present Illness: "I'm ok. I'm ok. I am taking it day by day. Dealing with this coronavirus crap". She rarely goes out except for errands. Her kids and grandkids come over but they meet out. She spends her days watching tv. It is hard to get motivated and is very bored. Pt is mildly depressed and is mostly feeling uninterested in everything. She does find that she isolates a little more. Pt is not napping during the day and states that is a good sign. Pt is sleeping well at night. Pt denies SI/HI. Anxiety is mild. Pt denies any desire to drink alcohol. Pt does not give in when the thoughts come.    Observations/Objective: I spoke with Kendra Ball on the phone.  Pt was calm, pleasant and cooperative.  Pt was engaged in the conversation and answered questions appropriately.  Speech was clear and coherent with normal rate, tone and volume.  Mood is depressed, affect is congruent. Thought processes are coherent, goal oriented and intact.  Thought content is logical.  Pt denies SI/HI.   Pt denies auditory and visual hallucinations and did not appear to be responding to internal stimuli.  Memory and concentration are good.  Fund of knowledge and use of language are average.  Insight and judgment are fair.  I am unable to comment on psychomotor activity, general appearance, hygiene, or eye contact as I was unable to  physically see the patient on the phone.  Vital signs not available since interview conducted virtually.    Assessment and Plan: MDD-recurrent, moderate; GAD; Insomnia; Alcohol use d/o in remission; Nicotine use d/o  Increase Effexor XR 150mg  po qD Wellbutrin SR 150mg  po BID Trazodone 200mg  po qHS Buspar 10mg  po TID for GAD   Follow Up Instructions: In 2-3 months or sooner if needed   I discussed the assessment and treatment plan with the patient. The patient was provided an opportunity to ask questions and all were answered. The patient agreed with the plan and demonstrated an understanding of the instructions.   The patient was advised to call back or seek an in-person evaluation if the symptoms worsen or if the condition fails to improve as anticipated.  I provided 20 minutes of non-face-to-face time during this encounter.   [name], MD

## 2018-12-30 ENCOUNTER — Other Ambulatory Visit: Payer: Self-pay

## 2018-12-30 ENCOUNTER — Encounter (HOSPITAL_BASED_OUTPATIENT_CLINIC_OR_DEPARTMENT_OTHER): Payer: Self-pay | Admitting: Emergency Medicine

## 2018-12-30 ENCOUNTER — Emergency Department (HOSPITAL_BASED_OUTPATIENT_CLINIC_OR_DEPARTMENT_OTHER)
Admission: EM | Admit: 2018-12-30 | Discharge: 2018-12-30 | Disposition: A | Discharge status: Home / Self Care | Payer: Medicare Other | Attending: Emergency Medicine | Admitting: Emergency Medicine

## 2018-12-30 ENCOUNTER — Emergency Department (HOSPITAL_BASED_OUTPATIENT_CLINIC_OR_DEPARTMENT_OTHER): Payer: Medicare Other

## 2018-12-30 DIAGNOSIS — Y929 Unspecified place or not applicable: Secondary | ICD-10-CM | POA: Diagnosis not present

## 2018-12-30 DIAGNOSIS — S8254XA Nondisplaced fracture of medial malleolus of right tibia, initial encounter for closed fracture: Secondary | ICD-10-CM

## 2018-12-30 DIAGNOSIS — I10 Essential (primary) hypertension: Secondary | ICD-10-CM | POA: Diagnosis not present

## 2018-12-30 DIAGNOSIS — S99922A Unspecified injury of left foot, initial encounter: Secondary | ICD-10-CM | POA: Diagnosis present

## 2018-12-30 DIAGNOSIS — S99921A Unspecified injury of right foot, initial encounter: Secondary | ICD-10-CM | POA: Diagnosis not present

## 2018-12-30 DIAGNOSIS — Y9301 Activity, walking, marching and hiking: Secondary | ICD-10-CM | POA: Insufficient documentation

## 2018-12-30 DIAGNOSIS — Z79899 Other long term (current) drug therapy: Secondary | ICD-10-CM | POA: Insufficient documentation

## 2018-12-30 DIAGNOSIS — S92505A Nondisplaced unspecified fracture of left lesser toe(s), initial encounter for closed fracture: Secondary | ICD-10-CM

## 2018-12-30 DIAGNOSIS — F17210 Nicotine dependence, cigarettes, uncomplicated: Secondary | ICD-10-CM | POA: Diagnosis not present

## 2018-12-30 DIAGNOSIS — E785 Hyperlipidemia, unspecified: Secondary | ICD-10-CM | POA: Diagnosis not present

## 2018-12-30 DIAGNOSIS — S92535A Nondisplaced fracture of distal phalanx of left lesser toe(s), initial encounter for closed fracture: Secondary | ICD-10-CM | POA: Diagnosis not present

## 2018-12-30 DIAGNOSIS — Z885 Allergy status to narcotic agent status: Secondary | ICD-10-CM | POA: Diagnosis not present

## 2018-12-30 DIAGNOSIS — W010XXA Fall on same level from slipping, tripping and stumbling without subsequent striking against object, initial encounter: Secondary | ICD-10-CM | POA: Insufficient documentation

## 2018-12-30 DIAGNOSIS — Y999 Unspecified external cause status: Secondary | ICD-10-CM | POA: Insufficient documentation

## 2018-12-30 DIAGNOSIS — J449 Chronic obstructive pulmonary disease, unspecified: Secondary | ICD-10-CM | POA: Diagnosis not present

## 2018-12-30 DIAGNOSIS — S92415A Nondisplaced fracture of proximal phalanx of left great toe, initial encounter for closed fracture: Secondary | ICD-10-CM | POA: Diagnosis not present

## 2018-12-30 MED ORDER — OXYCODONE-ACETAMINOPHEN 5-325 MG PO TABS: 1.0000 | ORAL_TABLET | Freq: Four times a day (QID) | ORAL | 0 refills | Status: AC | PRN

## 2018-12-30 MED ORDER — OXYCODONE-ACETAMINOPHEN 5-325 MG PO TABS
1.0000 | ORAL_TABLET | Freq: Once | ORAL | Status: AC
  Administered 2018-12-30: 11:00:00 1 via ORAL
  Filled 2018-12-30: qty 1

## 2018-12-30 NOTE — Discharge Instructions (Addendum)
Recommend weightbearing as tolerated for both your right leg and your left leg.  Please wear the fracture shoe and the fracture boot as instructed.  Recommend Tylenol, Motrin for pain control.  As needed for breakthrough pain, use the prescribed Percocet.

## 2018-12-30 NOTE — ED Notes (Signed)
Patient transported to X-ray 

## 2018-12-30 NOTE — ED Triage Notes (Signed)
Pt reports fall yesterday; swelling and bruising to bil feet, worse on RT

## 2018-12-30 NOTE — ED Provider Notes (Signed)
[facility] EMERGENCY DEPARTMENT Provider Note   CSN: [id number] Arrival date & time: 12/30/18  1021     History   Chief Complaint Chief Complaint  Patient presents with  . Fall  . Foot Pain    HPI Kendra Ball is a 73 y.o. female.  Presents the emergency department after fall.  Fall occurred yesterday, tripped and landed awkwardly on her bilateral feet.  Thinks she may have hit her right knee but denies any other trauma, specifically did not hit her hips, back, neck.  States she has been able to ambulate but has had some difficulty.  This morning having worsening pain and noted some swelling in her bilateral ankles and wanted to get checked out.  She denies numbness, tingling, weakness.  No head trauma, no loss consciousness, not on blood thinners.     HPI  Past Medical History:  Diagnosis Date  . Adenopathy    right supraclavicular  . Anxiety   . Arthritis   . Cancer ([place])   . COPD (chronic obstructive pulmonary disease) ([place])   . DEPRESSION 08/26/2008  . Hx of colonic polyps    First noted on  colonoscopy 2012  . Hypercholesteremia   . HYPERTENSION 08/26/2008  . HYPOTHYROIDISM 08/26/2008  . Lymphoma ([place])   . Obesity   . Osteoporosis   . Substance abuse [facility]    inpatient and outpatient tx for substance abuse  . TOBACCO USER 12/25/2009    Patient Active Problem List   Diagnosis Date Noted  . Port-A-Cath in place 12/27/2017  . 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 Coronary atherosclerosis 10/22/2015  . Chronic diarrhea 10/13/2015  . History of colon polyps 10/13/2015  . Smoker 09/14/2015  . Insomnia 05/05/2015  . COPD (chronic obstructive pulmonary disease) with chronic bronchitis ([place]) 02/13/2015  . Pulmonary nodule seen on imaging study 02/13/2015  . Osteoporosis 11/11/2014  . History of gastric bypass 09/10/2014  . Essential hypertension 05/22/2014  . GAD (generalized anxiety disorder) 01/30/2014  . MDD (major depressive disorder),  recurrent episode, moderate ([place]) 01/30/2014  . Dyslipidemia 10/03/2013  . Abnormal mammogram 10/01/2013  . Family history of premature coronary heart disease 08/13/2013  . Hypothyroidism 08/26/2008    Past Surgical History:  Procedure Laterality Date  . AXILLARY LYMPH NODE BIOPSY Right 06/22/2017   Procedure: RIGHT AXILLARY LYMPH NODE BIOPSY;  Surgeon: [name], MD;  Location: [facility];  Service: General;  Laterality: Right;  . [place] SURGERY  2006  . CESAREAN SECTION    . COLON RESECTION N/A 05/09/2014   Procedure: LAPAROSCOPIC HAND-ASSISTED EXTENDED RIGHT COLECTOMY, LAPAROSCOPIC LYSIS OF ADHESIONS, SPLENIC FLEXURE MOBILIZATION.;  Surgeon: [name], MD;  Location: [facility];  Service: General;  Laterality: N/A;  . DILATION AND CURETTAGE OF UTERUS     x2  . FOREARM FRACTURE SURGERY     right  . GASTRIC BYPASS  2006  . IR IMAGING GUIDED PORT INSERTION  12/01/2017  . IR REMOVAL TUN ACCESS W/ PORT W/O FL MOD SED  07/03/2018  . MASS EXCISION Right 02/24/2017   Procedure: EXCISIONAL BIOPSY RIGHT SUPRA-CLAVICULAR NODE;  Surgeon: [name], MD;  Location: [facility];  Service: ENT;  Laterality: Right;  . TONSILLECTOMY       OB History   No obstetric history on file.      Home Medications    Prior to Admission medications   Medication Sig Start Date End Date Taking? Authorizing Provider  albuterol (PROVENTIL HFA;VENTOLIN HFA) 108 (  90 Base) MCG/ACT inhaler Inhale 2 puffs into the lungs every 6 (six) hours as needed for wheezing or shortness of breath. 04/10/18   Kuneff, Renee A, DO  budesonide-formoterol (SYMBICORT) 160-4.5 MCG/ACT inhaler Inhale 2 puffs into the lungs 2 (two) times daily. 04/10/18   Kuneff, Renee A, DO  buPROPion (WELLBUTRIN SR) 150 MG 12 hr tablet Take 1 tablet (150 mg total) by mouth 2 (two) times daily. 11/29/18   [name], MD  busPIRone (BUSPAR) 10 MG tablet Take 1 tablet (10 mg total) by mouth 3 (three) times daily. 11/29/18   [name], MD  Cyanocobalamin (VITAMIN B-12 PO) Take 1,000 mg by mouth daily.     [provider]  ferrous fumarate (HEMOCYTE - 106 MG FE) 325 (106 FE) MG TABS Take 1 tablet by mouth daily. Reported on 10/06/2015    [provider]  levothyroxine (SYNTHROID, LEVOTHROID) 100 MCG tablet Take 1 tablet (100 mcg total) by mouth daily before breakfast. 04/10/18   Kuneff, Renee A, DO  Magnesium 250 MG TABS Take 250 mg by mouth 2 (two) times daily.     [provider]  Multiple Vitamins-Minerals (VITAMIN D3 COMPLETE PO) Take 1 tablet by mouth daily.    [provider]  oxyCODONE-acetaminophen (PERCOCET/ROXICET) 5-325 MG tablet Take 1 tablet by mouth every 6 (six) hours as needed for up to 3 days for severe pain. 12/30/18 01/02/19  Lucrezia Starch, MD  simvastatin (ZOCOR) 10 MG tablet Take 1 tablet (10 mg total) by mouth at bedtime. 04/10/18   Kuneff, Renee A, DO  traZODone (DESYREL) 100 MG tablet Take 2 tablets (200 mg total) by mouth at bedtime. 11/29/18   [name], MD  venlafaxine XR (EFFEXOR-XR) 150 MG 24 hr capsule Take 1 capsule (150 mg total) by mouth daily with breakfast. 11/29/18   [name], MD    Family History Family History  Problem Relation Age of Onset  . Cancer Mother        colon  . Dementia Mother   . Depression Mother   . Heart disease Father 34  . Heart disease Sister 86       stents, pacemaker  . Depression Sister   . Heart disease Brother 87       CABG, MI  . Depression Brother   . Obesity Daughter   . Suicidality Daughter   . Bipolar disorder Daughter   . Obesity Son   . Mental illness Daughter        bipolar  . Obesity Daughter   . Depression Sister   . Dementia Paternal Aunt   . Alcohol abuse Maternal Uncle   . Colon cancer Maternal Aunt   . Stomach cancer Maternal Grandfather     Social History Social History   Tobacco Use  . Smoking status: Current Every Day Smoker    Packs/day: 1.00    Years: 50.00    Pack  years: 50.00    Types: Cigarettes  . Smokeless tobacco: Never Used  Substance Use Topics  . Alcohol use: No    Comment: quit on Aug 27, 2015  . Drug use: No    Frequency: 20.0 times per week    Comment: last used May 2017     Allergies   Morphine sulfate   Review of Systems Review of Systems  Constitutional: Negative for chills and fever.  HENT: Negative for ear pain and sore throat.   Eyes: Negative for pain and visual disturbance.  Respiratory: Negative for  cough and shortness of breath.   Cardiovascular: Negative for chest pain and palpitations.  Gastrointestinal: Negative for abdominal pain and vomiting.  Genitourinary: Negative for dysuria and hematuria.  Musculoskeletal: Positive for arthralgias and joint swelling. Negative for back pain.  Skin: Negative for color change and rash.  Neurological: Negative for seizures and syncope.  All other systems reviewed and are negative.    Physical Exam Updated Vital Signs BP (!) 108/50 (BP Location: Right Arm)   Pulse 72   Temp 98.7 F (37.1 C) (Oral)   Resp 18   Ht 5\' 4"  (1.626 m)   Wt 66.7 kg   SpO2 95%   BMI 25.23 kg/m   Physical Exam Vitals signs and nursing note reviewed.  Constitutional:      General: She is not in acute distress.    Appearance: She is well-developed.  HENT:     Head: Normocephalic and atraumatic.  Eyes:     Conjunctiva/sclera: Conjunctivae normal.  Neck:     Musculoskeletal: Neck supple.  Cardiovascular:     Rate and Rhythm: Normal rate and regular rhythm.     Heart sounds: No murmur.  Pulmonary:     Effort: Pulmonary effort is normal. No respiratory distress.     Breath sounds: Normal breath sounds.  Abdominal:     Palpations: Abdomen is soft.     Tenderness: There is no abdominal tenderness.  Musculoskeletal:     Comments: Back: No tenderness palpation in C, T, L-spine  Right lower extremity: Noted superficial abrasion over anterior knee, no tenderness over knee, there is  swelling, tenderness over the medial malleolus, noted mild tenderness to palpation in the midfoot, no obvious deformity though, distal pulses sensation and cap refill intact  Left lower extremity: Ecchymosis over first, fourth toes, tenderness palpation over first and fourth toes, distal sensation, cap refill intact, normal ankle, knee, hip range of motion  Skin:    General: Skin is warm and dry.  Neurological:     General: No focal deficit present.     Mental Status: She is alert and oriented to person, place, and time.      ED Treatments / Results  Labs (all labs ordered are listed, but only abnormal results are displayed) Labs Reviewed - No data to display  EKG None  Radiology Dg Tibia/fibula Left  Result Date: 12/30/2018 CLINICAL DATA:  Pt fell yesterday; swelling and bruising bilateral feet; contusion on right knee; pain right ankle proximally; no h/o injuries in the past; pt unable to bear weight EXAM: LEFT TIBIA AND FIBULA - 2 VIEW COMPARISON:  None. FINDINGS: No acute fracture or subluxation. Degenerative changes are identified at the ankle. Note is made of soft tissue swelling of the LEFT foot. IMPRESSION: No evidence for acute tibia/fibular fracture. Electronically Signed   By: Nolon Nations M.D.   On: 12/30/2018 12:05   Dg Ankle Complete Right  Result Date: 12/30/2018 CLINICAL DATA:  Left ankle and foot pain and swelling after a fall yesterday. Initial encounter. EXAM: RIGHT ANKLE - COMPLETE 3+ VIEW COMPARISON:  None. FINDINGS: Diffuse soft tissue swelling is seen about the ankle. There is an acute, nondisplaced fracture of the medial malleolus. No other fracture is identified. IMPRESSION: Acute nondisplaced medial malleolar fracture. Diffuse soft tissue swelling. Electronically Signed   By: Inge Rise M.D.   On: 12/30/2018 12:05   Dg Knee Complete 4 Views Right  Result Date: 12/30/2018 CLINICAL DATA:  Fall.  Abrasion over right knee. EXAM: RIGHT KNEE -  COMPLETE 4+  VIEW COMPARISON:  None. FINDINGS: No joint effusion. No fracture or dislocation. No significant arthropathy. IMPRESSION: 1. No acute findings. Electronically Signed   By: Kerby Moors M.D.   On: 12/30/2018 12:02   Dg Foot Complete Left  Result Date: 12/30/2018 CLINICAL DATA:  Status post fall. EXAM: LEFT FOOT - COMPLETE 3+ VIEW COMPARISON:  None FINDINGS: The bones appear diffusely osteopenic. There is an age indeterminate, comminuted fracture deformity involving the proximal phalanx of the great toe. A second fracture is suspected involving the base of the fourth proximal phalanx. No additional fractures or dislocations identified. IMPRESSION: 1. Fracture deformities are noted involving the mid shaft of the first proximal phalanx and base of the fourth proximal phalanx. Fracture fragments appear nondisplaced. 2. Osteopenia. Electronically Signed   By: Kerby Moors M.D.   On: 12/30/2018 12:09   Dg Foot Complete Right  Result Date: 12/30/2018 CLINICAL DATA:  Left foot and ankle pain since a fall yesterday. Initial encounter. EXAM: RIGHT FOOT COMPLETE - 3+ VIEW COMPARISON:  None. FINDINGS: Acute nondisplaced medial malleolar fracture is identified. A few bone fragments is seen off the tip of the lateral malleolus likely due to remote injury. No other fracture is identified. Soft tissue swelling is present about the foot. IMPRESSION: Acute nondisplaced medial malleolar fracture. No other acute abnormality. Electronically Signed   By: Inge Rise M.D.   On: 12/30/2018 12:06    Procedures Procedures (including critical care time)  Medications Ordered in ED Medications  oxyCODONE-acetaminophen (PERCOCET/ROXICET) 5-325 MG per tablet 1 tablet (1 tablet Oral Given 12/30/18 1129)     Initial Impression / Assessment and Plan / ED Course  I have reviewed the triage vital signs and the nursing notes.  Pertinent labs & imaging results that were available during my care of the patient were reviewed by  me and considered in my medical decision making (see chart for details).        73 year old lady presented after mechanical fall yesterday with bilateral ankle, foot pain.  Neurovascularly intact.,  No associated injuries, no head trauma, no loss consciousness.  On exam patient was well-appearing except for injuries noted in physical exam.  X-rays were concerning for right nondisplaced distal medial malleolar fracture, left proximal phalanx right toe fracture, left fourth toe fracture, both nondisplaced.  Reviewed x-rays with Dr. Stann Mainland with orthopedic surgery.  Recommended fracture shoe for left foot, walking boot for right leg and weightbearing as tolerated.  Patient was able to walk but did have some difficulty with ambulation.  Family was able to arrange for walker to be delivered to house, patient only has a couple steps to get into house, otherwise no steps and house.  Recommend follow-up with orthopedic surgery.  Gave short prescription for Percocet.    After the discussed management above, the patient was determined to be safe for discharge.  The patient was in agreement with this plan and all questions regarding their care were answered.  ED return precautions were discussed and the patient will return to the ED with any significant worsening of condition.   Final Clinical Impressions(s) / ED Diagnoses   Final diagnoses:  Closed nondisplaced fracture of medial malleolus of right tibia, initial encounter  Closed nondisplaced fracture of proximal phalanx of left great toe, initial encounter  Closed nondisplaced fracture of phalanx of lesser toe of left foot, unspecified phalanx, initial encounter    ED Discharge Orders         Ordered  oxyCODONE-acetaminophen (PERCOCET/ROXICET) 5-325 MG tablet  Every 6 hours PRN     12/30/18 1422           Lucrezia Starch, MD 12/30/18 1512

## 2018-12-30 NOTE — ED Notes (Signed)
RN requested SW consult be attempted again.

## 2019-01-26 ENCOUNTER — Other Ambulatory Visit (HOSPITAL_COMMUNITY): Payer: Self-pay | Admitting: Psychiatry

## 2019-01-26 DIAGNOSIS — F411 Generalized anxiety disorder: Secondary | ICD-10-CM

## 2019-01-26 DIAGNOSIS — F331 Major depressive disorder, recurrent, moderate: Secondary | ICD-10-CM

## 2019-01-29 ENCOUNTER — Ambulatory Visit (INDEPENDENT_AMBULATORY_CARE_PROVIDER_SITE_OTHER): Payer: Medicare Other | Admitting: Family Medicine

## 2019-01-29 ENCOUNTER — Encounter: Payer: Self-pay | Admitting: Family Medicine

## 2019-01-29 ENCOUNTER — Other Ambulatory Visit: Payer: Self-pay

## 2019-01-29 VITALS — BP 115/71 | HR 73 | Temp 97.6°F | Resp 16 | Ht 64.0 in | Wt 147.0 lb

## 2019-01-29 DIAGNOSIS — E785 Hyperlipidemia, unspecified: Secondary | ICD-10-CM

## 2019-01-29 DIAGNOSIS — J449 Chronic obstructive pulmonary disease, unspecified: Secondary | ICD-10-CM | POA: Diagnosis not present

## 2019-01-29 DIAGNOSIS — E039 Hypothyroidism, unspecified: Secondary | ICD-10-CM

## 2019-01-29 DIAGNOSIS — Z1231 Encounter for screening mammogram for malignant neoplasm of breast: Secondary | ICD-10-CM

## 2019-01-29 DIAGNOSIS — M8580 Other specified disorders of bone density and structure, unspecified site: Secondary | ICD-10-CM

## 2019-01-29 DIAGNOSIS — I10 Essential (primary) hypertension: Secondary | ICD-10-CM

## 2019-01-29 DIAGNOSIS — Z9181 History of falling: Secondary | ICD-10-CM

## 2019-01-29 DIAGNOSIS — Z23 Encounter for immunization: Secondary | ICD-10-CM

## 2019-01-29 DIAGNOSIS — T07XXXA Unspecified multiple injuries, initial encounter: Secondary | ICD-10-CM | POA: Insufficient documentation

## 2019-01-29 HISTORY — DX: Unspecified multiple injuries, initial encounter: T07.XXXA

## 2019-01-29 HISTORY — DX: History of falling: Z91.81

## 2019-01-29 MED ORDER — ALBUTEROL SULFATE HFA 108 (90 BASE) MCG/ACT IN AERS: 2.0000 | INHALATION_SPRAY | Freq: Four times a day (QID) | RESPIRATORY_TRACT | 2 refills | Status: DC | PRN

## 2019-01-29 MED ORDER — BUDESONIDE-FORMOTEROL FUMARATE 160-4.5 MCG/ACT IN AERO: 2.0000 | INHALATION_SPRAY | Freq: Two times a day (BID) | RESPIRATORY_TRACT | 6 refills | Status: DC

## 2019-01-29 NOTE — Patient Instructions (Addendum)
They will call you to schedule your mammogram and bone density scan.  We will call you with lab results and refill your other medications at that time.  Please check to see the calcium and vit d doses on your bottles.  When orthopedics release you - please call us andd we will order physical therapy for you to help with balance.   Follow up in 6 months for chronic conditions. They will call you to get on our schedule.    Osteopenia  Osteopenia is a loss of thickness (density) inside of the bones. Another name for osteopenia is low bone mass. Mild osteopenia is a normal part of aging. It is not a disease, and it does not cause symptoms. However, if you have osteopenia and continue to lose bone mass, you could develop a condition that causes the bones to become thin and break more easily (osteoporosis). You may also lose some height, have back pain, and have a stooped posture. Although osteopenia is not a disease, making changes to your lifestyle and diet can help to prevent osteopenia from developing into osteoporosis. What are the causes? Osteopenia is caused by loss of calcium in the bones.  Bones are constantly changing. Old bone cells are continually being replaced with new bone cells. This process builds new bone. The mineral calcium is needed to build new bone and maintain bone density. Bone density is usually highest around age 80. After that, most people's bodies cannot replace all the bone they have lost with new bone. What increases the risk? You are more likely to develop this condition if:  You are older than age 40.  You are a woman who went through menopause early.  You have a long illness that keeps you in bed.  You do not get enough exercise.  You lack certain nutrients (malnutrition).  You have an overactive thyroid gland (hyperthyroidism).  You smoke.  You drink a lot of alcohol.  You are taking medicines that weaken the bones, such as steroids. What are the signs  or symptoms? This condition does not cause any symptoms. You may have a slightly higher risk for bone breaks (fractures), so getting fractures more easily than normal may be an indication of osteopenia. How is this diagnosed? Your health care provider can diagnose this condition with a special type of X-ray exam that measures bone density (dual-energy X-ray absorptiometry, DEXA). This test can measure bone density in your hips, spine, and wrists. Osteopenia has no symptoms, so this condition is usually diagnosed after a routine bone density screening test is done for osteoporosis. This routine screening is usually done for:  Women who are age 3 or older.  Men who are age 54 or older. If you have risk factors for osteopenia, you may have the screening test at an earlier age. How is this treated? Making dietary and lifestyle changes can lower your risk for osteoporosis. If you have severe osteopenia that is close to becoming osteoporosis, your health care provider may prescribe medicines and dietary supplements such as calcium and vitamin D. These supplements help to rebuild bone density. Follow these instructions at home:   Take over-the-counter and prescription medicines only as told by your health care provider. These include vitamins and supplements.  Eat a diet that is high in calcium and vitamin D. ? Calcium is found in dairy products, beans, salmon, and leafy green vegetables like spinach and broccoli. ? Look for foods that have vitamin D and calcium added to them (fortified  foods), such as orange juice, cereal, and bread.  Do 30 or more minutes of a weight-bearing exercise every day, such as walking, jogging, or playing a sport. These types of exercises strengthen the bones.  Take precautions at home to lower your risk of falling, such as: ? Keeping rooms well-lit and free of clutter, such as cords. ? Installing safety rails on stairs. ? Using rubber mats in the bathroom or other  areas that are often wet or slippery.  Do not use any products that contain nicotine or tobacco, such as cigarettes and e-cigarettes. If you need help quitting, ask your health care provider.  Avoid alcohol or limit alcohol intake to no more than 1 drink a day for nonpregnant women and 2 drinks a day for men. One drink equals 12 oz of beer, 5 oz of wine, or 1 oz of hard liquor.  Keep all follow-up visits as told by your health care provider. This is important. Contact a health care provider if:  You have not had a bone density screening for osteoporosis and you are: ? A woman, age 80 or older. ? A man, age 44 or older.  You are a postmenopausal woman who has not had a bone density screening for osteoporosis.  You are older than age 12 and you want to know if you should have bone density screening for osteoporosis. Summary  Osteopenia is a loss of thickness (density) inside of the bones. Another name for osteopenia is low bone mass.  Osteopenia is not a disease, but it may increase your risk for a condition that causes the bones to become thin and break more easily (osteoporosis).  You may be at risk for osteopenia if you are older than age 44 or if you are a woman who went through early menopause.  Osteopenia does not cause any symptoms, but it can be diagnosed with a bone density screening test.  Dietary and lifestyle changes are the first treatment for osteopenia. These may lower your risk for osteoporosis. This information is not intended to replace advice given to you by your health care provider. Make sure you discuss any questions you have with your health care provider. Document Released: 01/18/2017 Document Revised: 03/24/2017 Document Reviewed: 01/18/2017 Elsevier Patient Education  2020 [employer].

## 2019-01-29 NOTE — Progress Notes (Signed)
Kendra Ball , 1946-04-14, 73 y.o., female MRN: [id number] Patient Care Team    Relationship Specialty Notifications Start End  Ma Hillock, DO PCP - General Family Medicine  12/30/14   Juanita Craver, MD Consulting Physician Gastroenterology  05/09/14   Excell Seltzer, MD Consulting Physician General Surgery  05/09/14   Brandon Melnick, Ida  Psychology  10/16/15   [name], MD  Psychiatry  12/12/16     Chief Complaint  Patient presents with  . Fall    Pt is concerned with the 4 falls she has had recently and her bone health. She is concerned about chemo and softening of her bones. Pt needs refills on Simvastatin.      Subjective:  hyperlipidemia: Patient has been removed from her lisinopril 2/2 to  Low BP and dizziness last appt. She reports her BP has remained stable since and no additional dizziness. Patient denies chest pain, shortness of breath, dizziness or lower extremity edema.  She has lost weight slowly, since she has stopped drinking. Last chemo mid- Novemeber- in remission now. Taking low dose statin.  Hypothyroidism, unspecified type She reports complaince with levothyroxine 100 mcg daily.  She reports she is feeling well.   COPD (chronic obstructive pulmonary disease) with chronic bronchitis ([place]) She reports compliance  with Symbicort 2 puffs twice daily and albuterol inhaler when needed.  She denies any shortness of breath or wheezing.  Diffuse large B-cell lymphoma, unspecified body region [facility]) She is established with oncology and had her last chemo treatment 02/2018.    Osteopenia/mulitple fractures/falls: Last dexa 01/2017 osteopenia -2.2. She reports she does take vit d and calcium, although she does not know what the doses are. She has had 4 falls and 2 them with fractures of her lower ext. She is still recovering from those fractures. She has not been released yet from orthopedics. She reports she sometimes feels the feeling on the bottom of her feet,  feel callused with less sensation, but this is not all the time. She has had chemo- last 02/2018.   Depression screen [facility] 2/9 04/10/2018 02/16/2017 12/12/2016 10/16/2015 09/10/2014  Decreased Interest 0 0 2 0 0  Down, Depressed, Hopeless 0 0 0 0 0  PHQ - 2 Score 0 0 2 0 0  Altered sleeping 0 - 0 - -  Tired, decreased energy 2 - 3 - -  Change in appetite 2 - 0 - -  Feeling bad or failure about yourself  0 - 1 - -  Trouble concentrating 2 - 0 - -  Moving slowly or fidgety/restless 0 - 0 - -  Suicidal thoughts - - 0 - -  PHQ-9 Score 6 - 6 - -  Difficult doing work/chores Not difficult at all - Not difficult at all - -  Some encounter information is confidential and restricted. Go to Review Flowsheets activity to see all data.  Some recent data might be hidden    Allergies  Allergen Reactions  . Morphine Sulfate Swelling and Other (See Comments)    Swelling around injection area   Social History   Tobacco Use  . Smoking status: Current Every Day Smoker    Packs/day: 1.00    Years: 50.00    Pack years: 50.00    Types: Cigarettes  . Smokeless tobacco: Never Used  Substance Use Topics  . Alcohol use: No    Comment: quit on Aug 27, 2015   Past Medical History:  Diagnosis Date  . Adenopathy  Kendra Ball , 1946-04-14, 73 y.o., female MRN: [id number] Patient Care Team    Relationship Specialty Notifications Start End  Ma Hillock, DO PCP - General Family Medicine  12/30/14   Juanita Craver, MD Consulting Physician Gastroenterology  05/09/14   Excell Seltzer, MD Consulting Physician General Surgery  05/09/14   Brandon Melnick, Ida  Psychology  10/16/15   [name], MD  Psychiatry  12/12/16     Chief Complaint  Patient presents with  . Fall    Pt is concerned with the 4 falls she has had recently and her bone health. She is concerned about chemo and softening of her bones. Pt needs refills on Simvastatin.      Subjective:  hyperlipidemia: Patient has been removed from her lisinopril 2/2 to  Low BP and dizziness last appt. She reports her BP has remained stable since and no additional dizziness. Patient denies chest pain, shortness of breath, dizziness or lower extremity edema.  She has lost weight slowly, since she has stopped drinking. Last chemo mid- Novemeber- in remission now. Taking low dose statin.  Hypothyroidism, unspecified type She reports complaince with levothyroxine 100 mcg daily.  She reports she is feeling well.   COPD (chronic obstructive pulmonary disease) with chronic bronchitis ([place]) She reports compliance  with Symbicort 2 puffs twice daily and albuterol inhaler when needed.  She denies any shortness of breath or wheezing.  Diffuse large B-cell lymphoma, unspecified body region [facility]) She is established with oncology and had her last chemo treatment 02/2018.    Osteopenia/mulitple fractures/falls: Last dexa 01/2017 osteopenia -2.2. She reports she does take vit d and calcium, although she does not know what the doses are. She has had 4 falls and 2 them with fractures of her lower ext. She is still recovering from those fractures. She has not been released yet from orthopedics. She reports she sometimes feels the feeling on the bottom of her feet,  feel callused with less sensation, but this is not all the time. She has had chemo- last 02/2018.   Depression screen [facility] 2/9 04/10/2018 02/16/2017 12/12/2016 10/16/2015 09/10/2014  Decreased Interest 0 0 2 0 0  Down, Depressed, Hopeless 0 0 0 0 0  PHQ - 2 Score 0 0 2 0 0  Altered sleeping 0 - 0 - -  Tired, decreased energy 2 - 3 - -  Change in appetite 2 - 0 - -  Feeling bad or failure about yourself  0 - 1 - -  Trouble concentrating 2 - 0 - -  Moving slowly or fidgety/restless 0 - 0 - -  Suicidal thoughts - - 0 - -  PHQ-9 Score 6 - 6 - -  Difficult doing work/chores Not difficult at all - Not difficult at all - -  Some encounter information is confidential and restricted. Go to Review Flowsheets activity to see all data.  Some recent data might be hidden    Allergies  Allergen Reactions  . Morphine Sulfate Swelling and Other (See Comments)    Swelling around injection area   Social History   Tobacco Use  . Smoking status: Current Every Day Smoker    Packs/day: 1.00    Years: 50.00    Pack years: 50.00    Types: Cigarettes  . Smokeless tobacco: Never Used  Substance Use Topics  . Alcohol use: No    Comment: quit on Aug 27, 2015   Past Medical History:  Diagnosis Date  . Adenopathy    Kendra Ball , 03/07/1946, 73 y.o., female MRN: [id number] Patient Care Team    Relationship Specialty Notifications Start End  Kylah Maresh A, DO PCP - General Family Medicine  12/30/14   Mann, Jyothi, MD Consulting Physician Gastroenterology  05/09/14   Hoxworth, Benjamin, MD Consulting Physician General Surgery  05/09/14   [name], [name], LCAS  Psychology  10/16/15   Agarwal, Salina, MD  Psychiatry  12/12/16     Chief Complaint  Patient presents with  . Fall    Pt is concerned with the 4 falls she has had recently and her bone health. She is concerned about chemo and softening of her bones. Pt needs refills on Simvastatin.      Subjective:  hyperlipidemia: Patient has been removed from her lisinopril 2/2 to  Low BP and dizziness last appt. She reports her BP has remained stable since and no additional dizziness. Patient denies chest pain, shortness of breath, dizziness or lower extremity edema.  She has lost weight slowly, since she has stopped drinking. Last chemo mid- Novemeber- in remission now. Taking low dose statin.  Hypothyroidism, unspecified type She reports complaince with levothyroxine 100 mcg daily.  She reports she is feeling well.   COPD (chronic obstructive pulmonary disease) with chronic bronchitis ([facility]) She reports compliance  with Symbicort 2 puffs twice daily and albuterol inhaler when needed.  She denies any shortness of breath or wheezing.  Diffuse large B-cell lymphoma, unspecified body region ([facility]) She is established with oncology and had her last chemo treatment 02/2018.    Osteopenia/mulitple fractures/falls: Last dexa 01/2017 osteopenia -2.2. She reports she does take vit d and calcium, although she does not know what the doses are. She has had 4 falls and 2 them with fractures of her lower ext. She is still recovering from those fractures. She has not been released yet from orthopedics. She reports she sometimes feels the feeling on the bottom of her feet,  feel callused with less sensation, but this is not all the time. She has had chemo- last 02/2018.   Depression screen [facility] 2/9 04/10/2018 02/16/2017 12/12/2016 10/16/2015 09/10/2014  Decreased Interest 0 0 2 0 0  Down, Depressed, Hopeless 0 0 0 0 0  PHQ - 2 Score 0 0 2 0 0  Altered sleeping 0 - 0 - -  Tired, decreased energy 2 - 3 - -  Change in appetite 2 - 0 - -  Feeling bad or failure about yourself  0 - 1 - -  Trouble concentrating 2 - 0 - -  Moving slowly or fidgety/restless 0 - 0 - -  Suicidal thoughts - - 0 - -  PHQ-9 Score 6 - 6 - -  Difficult doing work/chores Not difficult at all - Not difficult at all - -  Some encounter information is confidential and restricted. Go to Review Flowsheets activity to see all data.  Some recent data might be hidden    Allergies  Allergen Reactions  . Morphine Sulfate Swelling and Other (See Comments)    Swelling around injection area   Social History   Tobacco Use  . Smoking status: Current Every Day Smoker    Packs/day: 1.00    Years: 50.00    Pack years: 50.00    Types: Cigarettes  . Smokeless tobacco: Never Used  Substance Use Topics  . Alcohol use: No    Comment: quit on Aug 27, 2015   Past Medical History:  Diagnosis Date  . Adenopathy        Kendra Ball , 03/07/1946, 73 y.o., female MRN: [id number] Patient Care Team    Relationship Specialty Notifications Start End  Kuneff, Renee A, DO PCP - General Family Medicine  12/30/14   Mann, Jyothi, MD Consulting Physician Gastroenterology  05/09/14   Hoxworth, Benjamin, MD Consulting Physician General Surgery  05/09/14   [name], [name], LCAS  Psychology  10/16/15   Agarwal, Salina, MD  Psychiatry  12/12/16     Chief Complaint  Patient presents with  . Fall    Pt is concerned with the 4 falls she has had recently and her bone health. She is concerned about chemo and softening of her bones. Pt needs refills on Simvastatin.      Subjective:  hyperlipidemia: Patient has been removed from her lisinopril 2/2 to  Low BP and dizziness last appt. She reports her BP has remained stable since and no additional dizziness. Patient denies chest pain, shortness of breath, dizziness or lower extremity edema.  She has lost weight slowly, since she has stopped drinking. Last chemo mid- Novemeber- in remission now. Taking low dose statin.  Hypothyroidism, unspecified type She reports complaince with levothyroxine 100 mcg daily.  She reports she is feeling well.   COPD (chronic obstructive pulmonary disease) with chronic bronchitis ([facility]) She reports compliance  with Symbicort 2 puffs twice daily and albuterol inhaler when needed.  She denies any shortness of breath or wheezing.  Diffuse large B-cell lymphoma, unspecified body region ([facility]) She is established with oncology and had her last chemo treatment 02/2018.    Osteopenia/mulitple fractures/falls: Last dexa 01/2017 osteopenia -2.2. She reports she does take vit d and calcium, although she does not know what the doses are. She has had 4 falls and 2 them with fractures of her lower ext. She is still recovering from those fractures. She has not been released yet from orthopedics. She reports she sometimes feels the feeling on the bottom of her feet,  feel callused with less sensation, but this is not all the time. She has had chemo- last 02/2018.   Depression screen [facility] 2/9 04/10/2018 02/16/2017 12/12/2016 10/16/2015 09/10/2014  Decreased Interest 0 0 2 0 0  Down, Depressed, Hopeless 0 0 0 0 0  PHQ - 2 Score 0 0 2 0 0  Altered sleeping 0 - 0 - -  Tired, decreased energy 2 - 3 - -  Change in appetite 2 - 0 - -  Feeling bad or failure about yourself  0 - 1 - -  Trouble concentrating 2 - 0 - -  Moving slowly or fidgety/restless 0 - 0 - -  Suicidal thoughts - - 0 - -  PHQ-9 Score 6 - 6 - -  Difficult doing work/chores Not difficult at all - Not difficult at all - -  Some encounter information is confidential and restricted. Go to Review Flowsheets activity to see all data.  Some recent data might be hidden    Allergies  Allergen Reactions  . Morphine Sulfate Swelling and Other (See Comments)    Swelling around injection area   Social History   Tobacco Use  . Smoking status: Current Every Day Smoker    Packs/day: 1.00    Years: 50.00    Pack years: 50.00    Types: Cigarettes  . Smokeless tobacco: Never Used  Substance Use Topics  . Alcohol use: No    Comment: quit on Aug 27, 2015   Past Medical History:  Diagnosis Date  . Adenopathy   

## 2019-01-30 ENCOUNTER — Telehealth: Payer: Self-pay | Admitting: Family Medicine

## 2019-01-30 DIAGNOSIS — E785 Hyperlipidemia, unspecified: Secondary | ICD-10-CM

## 2019-01-30 LAB — COMPREHENSIVE METABOLIC PANEL
ALT: 19 U/L (ref 0–35)
AST: 20 U/L (ref 0–37)
Albumin: 4.1 g/dL (ref 3.5–5.2)
Alkaline Phosphatase: 66 U/L (ref 39–117)
BUN: 10 mg/dL (ref 6–23)
CO2: 26 mEq/L (ref 19–32)
Calcium: 9.3 mg/dL (ref 8.4–10.5)
Chloride: 106 mEq/L (ref 96–112)
Creatinine, Ser: 0.68 mg/dL (ref 0.40–1.20)
GFR: 84.89 mL/min (ref >60.00)
Glucose, Bld: 79 mg/dL (ref 70–99)
Potassium: 4.3 mEq/L (ref 3.5–5.1)
Sodium: 141 mEq/L (ref 135–145)
Total Bilirubin: 0.5 mg/dL (ref 0.2–1.2)
Total Protein: 6 g/dL (ref 6.0–8.3)

## 2019-01-30 LAB — CBC WITH DIFFERENTIAL/PLATELET
Basophils Absolute: 0.1 10*3/uL (ref 0.0–0.1)
Basophils Relative: 1.5 % (ref 0.0–3.0)
Eosinophils Absolute: 0.1 10*3/uL (ref 0.0–0.7)
Eosinophils Relative: 0.9 % (ref 0.0–5.0)
HCT: 43.2 % (ref 36.0–46.0)
Hemoglobin: 14.2 g/dL (ref 12.0–15.0)
Lymphocytes Relative: 13.9 % (ref 12.0–46.0)
Lymphs Abs: 0.9 10*3/uL (ref 0.7–4.0)
MCHC: 32.9 g/dL (ref 30.0–36.0)
MCV: 100.9 fl — ABNORMAL HIGH (ref 78.0–100.0)
Monocytes Absolute: 0.5 10*3/uL (ref 0.1–1.0)
Monocytes Relative: 7.5 % (ref 3.0–12.0)
Neutro Abs: 5 10*3/uL (ref 1.4–7.7)
Neutrophils Relative %: 76.2 % (ref 43.0–77.0)
Platelets: 243 10*3/uL (ref 150.0–400.0)
RBC: 4.28 Mil/uL (ref 3.87–5.11)
RDW: 13.4 % (ref 11.5–15.5)
WBC: 6.5 10*3/uL (ref 4.0–10.5)

## 2019-01-30 LAB — LIPID PANEL
Cholesterol: 177 mg/dL (ref 0–200)
HDL: 83.1 mg/dL (ref >39.00)
LDL Cholesterol: 77 mg/dL (ref 0–99)
NonHDL: 93.53
Total CHOL/HDL Ratio: 2
Triglycerides: 84 mg/dL (ref 0.0–149.0)
VLDL: 16.8 mg/dL (ref 0.0–40.0)

## 2019-01-30 LAB — T4, FREE: Free T4: 0.92 ng/dL (ref 0.60–1.60)

## 2019-01-30 LAB — VITAMIN D 25 HYDROXY (VIT D DEFICIENCY, FRACTURES): VITD: 45.07 ng/mL (ref 30.00–100.00)

## 2019-01-30 LAB — TSH: TSH: 4.44 u[IU]/mL (ref 0.35–4.50)

## 2019-01-30 MED ORDER — LEVOTHYROXINE SODIUM 100 MCG PO TABS: 100.0000 ug | ORAL_TABLET | Freq: Every day | ORAL | 3 refills | Status: DC

## 2019-01-30 MED ORDER — SIMVASTATIN 10 MG PO TABS: 10.0000 mg | ORAL_TABLET | Freq: Every day | ORAL | 3 refills | Status: DC

## 2019-01-30 NOTE — Telephone Encounter (Signed)
Please inform patient the following information: Labs are all normal/stable and look great.  I have refilled her meds for her.

## 2019-01-30 NOTE — Telephone Encounter (Signed)
Pt was called and given results, she verbalized understanding  

## 2019-02-07 ENCOUNTER — Other Ambulatory Visit: Payer: Self-pay

## 2019-02-07 ENCOUNTER — Ambulatory Visit (INDEPENDENT_AMBULATORY_CARE_PROVIDER_SITE_OTHER): Payer: Medicare Other | Admitting: Psychiatry

## 2019-02-07 ENCOUNTER — Encounter (HOSPITAL_COMMUNITY): Payer: Self-pay | Admitting: Psychiatry

## 2019-02-07 DIAGNOSIS — F1020 Alcohol dependence, uncomplicated: Secondary | ICD-10-CM

## 2019-02-07 DIAGNOSIS — F331 Major depressive disorder, recurrent, moderate: Secondary | ICD-10-CM | POA: Diagnosis not present

## 2019-02-07 DIAGNOSIS — F17200 Nicotine dependence, unspecified, uncomplicated: Secondary | ICD-10-CM

## 2019-02-07 DIAGNOSIS — F5105 Insomnia due to other mental disorder: Secondary | ICD-10-CM

## 2019-02-07 DIAGNOSIS — F99 Mental disorder, not otherwise specified: Secondary | ICD-10-CM

## 2019-02-07 DIAGNOSIS — F411 Generalized anxiety disorder: Secondary | ICD-10-CM | POA: Diagnosis not present

## 2019-02-07 MED ORDER — BUPROPION HCL ER (SR) 150 MG PO TB12: 150.0000 mg | ORAL_TABLET | Freq: Two times a day (BID) | ORAL | 0 refills | Status: DC

## 2019-02-07 MED ORDER — TRAZODONE HCL 100 MG PO TABS: 200.0000 mg | ORAL_TABLET | Freq: Every day | ORAL | 0 refills | Status: DC

## 2019-02-07 MED ORDER — VENLAFAXINE HCL ER 150 MG PO CP24: 150.0000 mg | ORAL_CAPSULE | Freq: Every day | ORAL | 0 refills | Status: DC

## 2019-02-07 MED ORDER — BUSPIRONE HCL 10 MG PO TABS: 10.0000 mg | ORAL_TABLET | Freq: Three times a day (TID) | ORAL | 0 refills | Status: DC

## 2019-02-07 NOTE — Progress Notes (Signed)
  Virtual Visit via Telephone Note  I connected with Kendra Ball  on 02/07/19 at  9:30 AM EDT by telephone and verified that I am speaking with the correct person using two identifiers.  Location: Patient: home Provider: office   I discussed the limitations, risks, security and privacy concerns of performing an evaluation and management service by telephone and the availability of in person appointments. I also discussed with the patient that there may be a patient responsible charge related to this service. The patient expressed understanding and agreed to proceed.   History of Present Illness: "Pretty good. Pretty good". A little over 1 month ago she feel and broke her ankle and few toes. She is recovering and is wearing a brace and medical shoe. She has a follow up appointment with her doctor tomorrow. She keeps busy with things around her house. She is always doing puzzles and a scrabble game online with her sisters. Tonyetta does meet with her kids and grandkids regularly. They usually meet outside. Suraya wonders if she being to cautious. I think I am good place. The increase in Effexor really helped. I don't really know that I have depression. Everyone has down days but I don't sleep in bed all day anymore". She denies isolation, anhedonia, hopelessness. She denies SI/HI. Her sleep and appetite are good. Lavra has noticed an improvement in anxiety. She has some anxiety every once a while. The meds are helping. She has rare desires to drink alcohol. She is in regular contact with her sponsor. No alcohol use since she went into rehab.     Observations/Objective: I spoke with Kendra Ball on the phone.  Pt was calm, pleasant and cooperative.  Pt was engaged in the conversation and answered questions appropriately.  Speech was clear and coherent with normal rate, tone and volume.  Mood is euthymic, affect is full. Thought processes are coherent, goal oriented and intact.  Thought  content is logical.  Pt denies SI/HI.   Pt denies auditory and visual hallucinations and did not appear to be responding to internal stimuli.  Memory and concentration are good.  Fund of knowledge and use of language are average.  Insight and judgment are fair.  I am unable to comment on psychomotor activity, general appearance, hygiene, or eye contact as I was unable to physically see the patient on the phone.  Vital signs not available since interview conducted virtually.     Assessment and Plan: MDD- recurrent, moderate; GAD; Insomnia; Nicotine use d/o; Alcohol use d/o in remission  Status of current symptoms: stable  Effexor XR 150mg  po qD  Wellbutrin SR 150mg  po BID  Trazodone 200mg  po qHS  Buspar 10mg  po TID     Follow Up Instructions: In 12 weeks or sooner if needed   I discussed the assessment and treatment plan with the patient. The patient was provided an opportunity to ask questions and all were answered. The patient agreed with the plan and demonstrated an understanding of the instructions.   The patient was advised to call back or seek an in-person evaluation if the symptoms worsen or if the condition fails to improve as anticipated.  I provided 15 minutes of non-face-to-face time during this encounter.   [name], MD

## 2019-02-18 ENCOUNTER — Other Ambulatory Visit: Payer: Self-pay

## 2019-02-18 ENCOUNTER — Inpatient Hospital Stay: Payer: Medicare Other | Attending: Oncology | Admitting: Oncology

## 2019-02-18 VITALS — BP 131/78 | HR 75 | Temp 98.3°F | Resp 17 | Ht 64.0 in | Wt 148.2 lb

## 2019-02-18 DIAGNOSIS — R200 Anesthesia of skin: Secondary | ICD-10-CM | POA: Insufficient documentation

## 2019-02-18 DIAGNOSIS — C8330 Diffuse large B-cell lymphoma, unspecified site: Secondary | ICD-10-CM | POA: Diagnosis not present

## 2019-02-18 DIAGNOSIS — E7800 Pure hypercholesterolemia, unspecified: Secondary | ICD-10-CM | POA: Insufficient documentation

## 2019-02-18 DIAGNOSIS — C8338 Diffuse large B-cell lymphoma, lymph nodes of multiple sites: Secondary | ICD-10-CM

## 2019-02-18 DIAGNOSIS — Z808 Family history of malignant neoplasm of other organs or systems: Secondary | ICD-10-CM | POA: Insufficient documentation

## 2019-02-18 DIAGNOSIS — Z800 Family history of malignant neoplasm of digestive organs: Secondary | ICD-10-CM | POA: Insufficient documentation

## 2019-02-18 DIAGNOSIS — E039 Hypothyroidism, unspecified: Secondary | ICD-10-CM | POA: Diagnosis not present

## 2019-02-18 DIAGNOSIS — Z79899 Other long term (current) drug therapy: Secondary | ICD-10-CM | POA: Diagnosis not present

## 2019-02-18 DIAGNOSIS — I10 Essential (primary) hypertension: Secondary | ICD-10-CM | POA: Insufficient documentation

## 2019-02-18 NOTE — Progress Notes (Signed)
[facility] OFFICE PROGRESS NOTE   Diagnosis: Non-Hodgkin's lymphoma  INTERVAL HISTORY:   Ms. Lynes returns as scheduled.  She feels well.  No fever or night sweats.  Good appetite.  No palpable lymph nodes.  She recently fell in her house and fractured the right ankle and several left-sided toes.  She generally does not have difficulty with balance.  She has occasional numbness at the soles of the feet, but no consistent numbness.  Objective:  Vital signs in last 24 hours:  Blood pressure 131/78, pulse 75, temperature 98.3 F (36.8 C), temperature source Temporal, resp. rate 17, height 5\' 4"  (1.626 m), weight 148 lb 3.2 oz (67.2 kg), SpO2 98 %.    HEENT: Neck without mass Lymphatics: No cervical, supraclavicular, axillary, or inguinal nodes GI: No hepatosplenomegaly, nontender, no mass Vascular: No leg edema  Lab Results:  Lab Results  Component Value Date   WBC 6.5 01/29/2019   HGB 14.2 01/29/2019   HCT 43.2 01/29/2019   MCV 100.9 (H) 01/29/2019   PLT 243.0 01/29/2019   NEUTROABS 5.0 01/29/2019    CMP  Lab Results  Component Value Date   NA 141 01/29/2019   K 4.3 01/29/2019   CL 106 01/29/2019   CO2 26 01/29/2019   GLUCOSE 79 01/29/2019   BUN 10 01/29/2019   CREATININE 0.68 01/29/2019   CALCIUM 9.3 01/29/2019   PROT 6.0 01/29/2019   ALBUMIN 4.1 01/29/2019   AST 20 01/29/2019   ALT 19 01/29/2019   ALKPHOS 66 01/29/2019   BILITOT 0.5 01/29/2019   GFRNONAA >60 07/03/2018   GFRAA >60 07/03/2018     Medications: I have reviewed the patient's current medications.   Assessment/Plan: 1. Non-Hodgkin's lymphoma, diffuse large B-cell lymphoma, CD20 positive  Ultrasound of the neck 02/18/2017-right supraclavicular adenopathy with at least 3 nodes, largest measuring 1.3 x 1.1 x 1.4 cm; 2 adjacent hypoechoic right occipital lymph nodes  Excisional biopsy right supraclavicular lymphadenopathy 123XX123 (Dr. Deetta Perla lymphoid  proliferation. Necrotic soft tissue with associated acute and chronic inflammation.By flow cytometry no monoclonal B-cell or phenotypically aberrant T-cell population. Special stains for microorganisms including AFB, GMS, PAS and Warthin-Starry, are negative.  CT chest 05/30/2017- multiple enlarged right axillary lymph nodes. Largest right axillary lymph node measures 2.6 x 2.2 cm. Single enlarged left axillary lymph node measuring 16 mm.  CT neck 06/03/2017-known bilateral axillary lymphadenopathy partially covered on the scan. No lymphadenopathy seen in the neck.  Biopsy right axillary lymph node 06/22/2017(Dr. Tsuei)- fibroadipose tissue with necrosis, chronic inflammation and granulation tissue  PET scan 07/19/2017-hypermetabolic bilateral axillary lymph nodes. Hypermetabolic but small right supraclavicular lymph nodes. In the right omentum there is a small focus of rim density and internal fat density which is hypermetabolic.  CT neck 10/25/2017- progressedbilateral subclavian lymphadenopathy since February, right greater than left. Conspicuous enlargement of the small lymph nodes at the right level 2 and 3 nodal stations.  CT chest/abdomen/pelvis 10/25/2017-progressive adenopathy identified within the left axilla, bilateral supraclavicular stations and left inguinal region. Persistent but improved right axillary adenopathy. Scattered indeterminate low-attenuation foci within the spleen not seen on study from 05/30/2017.  Excisional biopsy of right supraclavicular and posterior triangle lymph nodes on7/25/2019-diffuse large B-cell lymphoma, CD20 positive  PET scan [id number] hypermetabolic lymph nodes and progressive hypermetabolism in existing lymph nodes. Hypermetabolic focus along the anterior aspect of the right scapula with adjacent soft tissue mass. No new adenopathy involving the mediastinal or hilar regions of the chest or the abdomen or pelvis. No  obvious involvement of the  liver or spleen.  IPI score 3  Cycle 1 CHOP/Rituxan 12/04/2017  Cycle 2 CHOP/Rituxan 12/27/2017  Cycle 3 CHOP/Rituxan 01/16/2018  Cycle 4 CHOP/Rituxan 02/05/2018  PET 02/19/2018- resolution of previously noted hypermetabolic adenopathy, no residual hypermetabolic osseous lesions  Cycle 5 CHOP/rituximab 02/27/2018  Cycle 6 CHOP/rituximab 03/20/2018 2. Hypertension  3. Hypothyroid 4. Hypercholesterolemia 5. Family history significant for colon cancer, stomach cancer and brain cancer 6. Ongoing tobacco use 7. Port-A-Cath placement 12/01/2017, Interventional Radiology 8. Left arm edema-negative venous Doppler 02/26/2018     Disposition: Ms. Troise is in clinical remission from non-Hodgkin's lymphoma.  She will return for an office visit in 6 months.  Betsy Coder, MD  02/18/2019  12:19 PM

## 2019-02-19 ENCOUNTER — Telehealth: Payer: Self-pay | Admitting: Oncology

## 2019-02-19 NOTE — Telephone Encounter (Signed)
I talk patient regarding schedule  °

## 2019-05-02 ENCOUNTER — Other Ambulatory Visit: Payer: Self-pay

## 2019-05-02 ENCOUNTER — Encounter (HOSPITAL_COMMUNITY): Payer: Self-pay | Admitting: Psychiatry

## 2019-05-02 ENCOUNTER — Ambulatory Visit (INDEPENDENT_AMBULATORY_CARE_PROVIDER_SITE_OTHER): Payer: Medicare PPO | Admitting: Psychiatry

## 2019-05-02 DIAGNOSIS — F331 Major depressive disorder, recurrent, moderate: Secondary | ICD-10-CM | POA: Diagnosis not present

## 2019-05-02 DIAGNOSIS — F17200 Nicotine dependence, unspecified, uncomplicated: Secondary | ICD-10-CM

## 2019-05-02 DIAGNOSIS — F5105 Insomnia due to other mental disorder: Secondary | ICD-10-CM

## 2019-05-02 DIAGNOSIS — F1020 Alcohol dependence, uncomplicated: Secondary | ICD-10-CM | POA: Diagnosis not present

## 2019-05-02 DIAGNOSIS — F411 Generalized anxiety disorder: Secondary | ICD-10-CM | POA: Diagnosis not present

## 2019-05-02 DIAGNOSIS — F99 Mental disorder, not otherwise specified: Secondary | ICD-10-CM

## 2019-05-02 MED ORDER — VENLAFAXINE HCL ER 75 MG PO CP24: 225.0000 mg | ORAL_CAPSULE | Freq: Every day | ORAL | 0 refills | Status: DC

## 2019-05-02 MED ORDER — BUSPIRONE HCL 10 MG PO TABS: 10.0000 mg | ORAL_TABLET | Freq: Three times a day (TID) | ORAL | 0 refills | Status: DC

## 2019-05-02 MED ORDER — TRAZODONE HCL 100 MG PO TABS: 200.0000 mg | ORAL_TABLET | Freq: Every day | ORAL | 0 refills | Status: DC

## 2019-05-02 MED ORDER — BUPROPION HCL ER (SR) 150 MG PO TB12: 150.0000 mg | ORAL_TABLET | Freq: Two times a day (BID) | ORAL | 0 refills | Status: DC

## 2019-05-02 NOTE — Progress Notes (Signed)
  Virtual Visit via Telephone Note  I connected with Kendra Ball  on 05/02/19 at 11:00 AM EST by telephone and verified that I am speaking with the correct person using two identifiers.  Location: Patient: home Provider: office   I discussed the limitations, risks, security and privacy concerns of performing an evaluation and management service by telephone and the availability of in person appointments. I also discussed with the patient that there may be a patient responsible charge related to this service. The patient expressed understanding and agreed to proceed.   History of Present Illness: [name] shares her depression has worsened for about 2 months now. She feels sad a lot. She has low energy and low motivation. She is only showering 2x/week. She is endorsing anhedonia. [name] is sleeping a lot and feels easily overwhelmed. She is watching tv and news a lot.  Historically she has had depression in the winter. [name] is talking with her daughters a lot but rarely in person due to cold weather. She is worried because she feels like she is heading into bad depression and a relapse. [name] has had some cravings in the late evenings 1-2x/month. She has been able to resist by going to bed. [name] has not talked to her sponsor in 1 month because it is difficult to have these conversations. She denies SI/HI.     Observations/Objective: I spoke with Kendra Ball on the phone.  Pt was calm, pleasant and cooperative.  Pt was engaged in the conversation and answered questions appropriately.  Speech was clear and coherent with normal rate, tone and volume.  Mood is depressed and anxious, affect is congruent. Thought processes are coherent, goal oriented and intact.  Thought content is logical.  Pt denies SI/HI.   Pt denies auditory and visual hallucinations and did not appear to be responding to internal stimuli.  Memory and concentration are good.  Fund of knowledge and use of language  are average.  Insight and judgment are fair.  I am unable to comment on psychomotor activity, general appearance, hygiene, or eye contact as I was unable to physically see the patient on the phone.  Vital signs not available since interview conducted virtually.     Assessment and Plan: MDD-recurrent, moderate; GAD; Insomnia; Nicotine use d/o; Alcohol use d/o in remission  Status of current symptoms: worsening depression  Increase Effexor XR 225mg  po qD  Wellbutrin SR 150mg  po BID  Trazodone 200mg  po qHS  Buspar 10mg  po TID  Discussed using light therapy   Follow Up Instructions: In 6 weeks or sooner if needed   I discussed the assessment and treatment plan with the patient. The patient was provided an opportunity to ask questions and all were answered. The patient agreed with the plan and demonstrated an understanding of the instructions.   The patient was advised to call back or seek an in-person evaluation if the symptoms worsen or if the condition fails to improve as anticipated.  I provided 20 minutes of non-face-to-face time during this encounter.   [name], MD

## 2019-05-15 ENCOUNTER — Ambulatory Visit
Admission: RE | Admit: 2019-05-15 | Discharge: 2019-05-15 | Disposition: A | Discharge status: Home / Self Care | Payer: Medicare PPO | Source: Ambulatory Visit | Attending: Family Medicine | Admitting: Family Medicine

## 2019-05-15 ENCOUNTER — Other Ambulatory Visit: Payer: Self-pay

## 2019-05-15 DIAGNOSIS — Z1231 Encounter for screening mammogram for malignant neoplasm of breast: Secondary | ICD-10-CM

## 2019-05-15 DIAGNOSIS — Z780 Asymptomatic menopausal state: Secondary | ICD-10-CM | POA: Diagnosis not present

## 2019-05-15 DIAGNOSIS — M810 Age-related osteoporosis without current pathological fracture: Secondary | ICD-10-CM | POA: Diagnosis not present

## 2019-05-15 DIAGNOSIS — M8580 Other specified disorders of bone density and structure, unspecified site: Secondary | ICD-10-CM

## 2019-05-15 DIAGNOSIS — T07XXXA Unspecified multiple injuries, initial encounter: Secondary | ICD-10-CM

## 2019-05-16 ENCOUNTER — Telehealth: Payer: Self-pay | Admitting: Family Medicine

## 2019-05-16 DIAGNOSIS — M810 Age-related osteoporosis without current pathological fracture: Secondary | ICD-10-CM

## 2019-05-16 NOTE — Telephone Encounter (Signed)
Please inform patient her mammogram is normal. Her bone density has rather significant osteoporosis.  There has been a significant decrease in her bone density since last check. -I can see by her records she had been on Fosamax for almost 5 years.  -Maintaining adequate vitamin D and calcium supplementation is important.  Her vitamin D levels look great in October. There are other options for medications to be prescribed for osteoporosis, however in her case I would need to counsel her and discuss those options prior to starting secondary to her history of lymphoma.  If she is interested in considering prescribed medication or discussing, please set her up for an appointment-can be a virtual video. -Repeat bone density in 2 years

## 2019-05-17 NOTE — Telephone Encounter (Signed)
Pt was called and given information. She would like to discuss medications/treatment with Dr Raoul Pitch. She states she did not take Fosamax the past two years and did not think she had taken the medication for 5 years. Pt would like appt virtually to dicuss.   Pt has appt on Wednesday 05/22/2019 for acute issue and discuss osteoporosis medication but was advised there may not be enough time to discuss all those complaints in one visit. She understood and we scheduled her for a visit on 05/23/19 VV just to discuss osteoporosis.

## 2019-05-22 ENCOUNTER — Other Ambulatory Visit: Payer: Self-pay

## 2019-05-22 ENCOUNTER — Encounter: Payer: Self-pay | Admitting: Family Medicine

## 2019-05-22 ENCOUNTER — Ambulatory Visit (INDEPENDENT_AMBULATORY_CARE_PROVIDER_SITE_OTHER): Payer: Medicare PPO | Admitting: Family Medicine

## 2019-05-22 VITALS — BP 106/70 | HR 75 | Temp 97.7°F | Resp 16 | Ht 64.0 in | Wt 151.5 lb

## 2019-05-22 DIAGNOSIS — R61 Generalized hyperhidrosis: Secondary | ICD-10-CM | POA: Diagnosis not present

## 2019-05-22 DIAGNOSIS — R42 Dizziness and giddiness: Secondary | ICD-10-CM | POA: Diagnosis not present

## 2019-05-22 DIAGNOSIS — R911 Solitary pulmonary nodule: Secondary | ICD-10-CM

## 2019-05-22 DIAGNOSIS — F17200 Nicotine dependence, unspecified, uncomplicated: Secondary | ICD-10-CM | POA: Diagnosis not present

## 2019-05-22 DIAGNOSIS — G4452 New daily persistent headache (NDPH): Secondary | ICD-10-CM

## 2019-05-22 DIAGNOSIS — R29818 Other symptoms and signs involving the nervous system: Secondary | ICD-10-CM

## 2019-05-22 DIAGNOSIS — Z8572 Personal history of non-Hodgkin lymphomas: Secondary | ICD-10-CM

## 2019-05-22 DIAGNOSIS — Z122 Encounter for screening for malignant neoplasm of respiratory organs: Secondary | ICD-10-CM

## 2019-05-22 DIAGNOSIS — R2681 Unsteadiness on feet: Secondary | ICD-10-CM | POA: Diagnosis not present

## 2019-05-22 DIAGNOSIS — R251 Tremor, unspecified: Secondary | ICD-10-CM | POA: Diagnosis not present

## 2019-05-22 LAB — T4, FREE: Free T4: 1.12 ng/dL (ref 0.60–1.60)

## 2019-05-22 LAB — CBC WITH DIFFERENTIAL/PLATELET
Basophils Absolute: 0 10*3/uL (ref 0.0–0.1)
Basophils Relative: 0.7 % (ref 0.0–3.0)
Eosinophils Absolute: 0.1 10*3/uL (ref 0.0–0.7)
Eosinophils Relative: 1 % (ref 0.0–5.0)
HCT: 42.9 % (ref 36.0–46.0)
Hemoglobin: 14.2 g/dL (ref 12.0–15.0)
Lymphocytes Relative: 15.2 % (ref 12.0–46.0)
Lymphs Abs: 0.9 10*3/uL (ref 0.7–4.0)
MCHC: 33.1 g/dL (ref 30.0–36.0)
MCV: 98.9 fl (ref 78.0–100.0)
Monocytes Absolute: 0.5 10*3/uL (ref 0.1–1.0)
Monocytes Relative: 7.7 % (ref 3.0–12.0)
Neutro Abs: 4.7 10*3/uL (ref 1.4–7.7)
Neutrophils Relative %: 75.4 % (ref 43.0–77.0)
Platelets: 244 10*3/uL (ref 150.0–400.0)
RBC: 4.34 Mil/uL (ref 3.87–5.11)
RDW: 13.3 % (ref 11.5–15.5)
WBC: 6.2 10*3/uL (ref 4.0–10.5)

## 2019-05-22 LAB — VITAMIN B12: Vitamin B-12: 571 pg/mL (ref 211–911)

## 2019-05-22 LAB — TSH: TSH: 0.99 u[IU]/mL (ref 0.35–4.50)

## 2019-05-22 LAB — T3, FREE: T3, Free: 2.7 pg/mL (ref 2.3–4.2)

## 2019-05-22 NOTE — Patient Instructions (Addendum)
Make an appt with your eye doctor for full eye exam.  I will call you with lab results and discuss next step.  HYDRATE. Dupuytren's Contracture Dupuytren's contracture is a condition in which tissue under the skin of the palm becomes thick. This causes one or more of the fingers to curl inward (contract) toward the palm. After a while, the fingers may not be able to straighten out. This condition affects some or all of the fingers and the palm of the hand. This condition may affect one or both hands. Dupuytren's contracture is a long-term (chronic) condition that develops (progresses) slowly over time. There is no cure, but symptoms can be managed and progression can be slowed with treatment. This condition is usually not dangerous or painful, but it can interfere with everyday tasks. What are the causes?  This condition is caused by tissue (fascia) in the palm that gets thicker and tighter. When the fascia thickens, it pulls on the cords of tissue (tendons) that control finger movement. This causes the fingers to contract. The cause of fascia thickening is not known. However, the condition is often passed along from parent to child (inherited). What increases the risk? The following factors may make you more likely to develop this condition:  Being 68 years of age or older.  Being female.  Having a family history of this condition.  Using tobacco products, including cigarettes, chewing tobacco, and e-cigarettes.  Drinking alcohol excessively.  Having diabetes.  Having a seizure disorder. What are the signs or symptoms? Early symptoms of this condition may include:  Thick, puckered skin on the hand.  One or more lumps (nodules) on the palm. Nodules may be tender when they first appear, but they are generally painless. Later symptoms of this condition may include:  Thick cords of tissue in the palm.  Fingers curled up toward the palm.  Inability to straighten the fingers into their  normal position. Though this condition is usually painless, you may have discomfort when holding or grabbing objects. How is this diagnosed? This condition is diagnosed with a physical exam, which may include:  Looking at your hands and feeling your palms. This is to check for thickened fascia and nodules.  Measuring finger motion.  Doing the [place] tabletop test. You may be asked to try to put your hand on a surface, with your palm down and your fingers straight out. How is this treated? There is no cure for this condition, but treatment can relieve discomfort and make symptoms more manageable. Treatment options may include:  Physical therapy. This can strengthen your hand and increase flexibility.  Occupational therapy. This can help you with everyday tasks that may be more difficult because of your condition.  Shots (injections). Substances may be injected into your hand, such as: ? Medicines that help to decrease swelling (corticosteroids). ? Proteins (collagenase) to weaken thick tissue. After a collagenase injection, your health care provider may stretch your fingers.  Needle aponeurotomy. A needle is pushed through the skin and into the fascia. Moving the needle against the fascia can weaken or break up the thick tissue.  Surgery. This may be needed if your condition causes discomfort or interferes with everyday activities. Physical therapy is usually needed after surgery. No treatment is guaranteed to cure this condition. Recurrence of symptoms is common. Follow these instructions at home: Hand care  Take these actions to help protect your hand from possible injury: ? Use tools that have padded grips. ? Wear protective gloves while  you work with your hands. ? Avoid repetitive hand movements. General instructions  Take over-the-counter and prescription medicines only as told by your health care provider.  Manage any other conditions that you have, such as diabetes.  If  physical therapy was prescribed, do exercises as told by your health care provider.  Do not use any products that contain nicotine or tobacco, such as cigarettes, e-cigarettes, and chewing tobacco. If you need help quitting, ask your health care provider.  If you drink alcohol: ? Limit how much you use to:  0-1 drink a day for women.  0-2 drinks a day for men. ? Be aware of how much alcohol is in your drink. In the [place] one drink equals one 12 oz bottle of beer (355 mL), one 5 oz glass of wine (148 mL), or one 1 oz glass of hard liquor (44 mL).  Keep all follow-up visits as told by your health care provider. This is important. Contact a health care provider if:  You develop new symptoms, or your symptoms get worse.  You have pain that gets worse or does not get better with medicine.  You have difficulty or discomfort with everyday tasks.  You develop numbness or tingling. Get help right away if:  You have severe pain.  Your fingers change color or become unusually cold. Summary  Dupuytren's contracture is a condition in which tissue under the skin of the palm becomes thick.  This condition is caused by tissue (fascia) that thickens. When it thickens, it pulls on the cords of tissue (tendons) that control finger movement and makes the fingers to contract.  You are more likely to develop this condition if you are a man, are over 7 years of age, have a family history of the condition, and drink a lot of alcohol.  This condition can be treated with physical and occupational therapy, injections, and surgery.  Follow instructions about how to care for your hand. Get help right away if you have severe pain or your fingers change color or become cold. This information is not intended to replace advice given to you by your health care provider. Make sure you discuss any questions you have with your health care provider. Document Revised: 10/31/2017 Document Reviewed:  10/31/2017 Elsevier Patient Education  [place].

## 2019-05-22 NOTE — Progress Notes (Signed)
This visit occurred during the SARS-CoV-2 public health emergency.  Safety protocols were in place, including screening questions prior to the visit, additional usage of staff PPE, and extensive cleaning of exam room while observing appropriate contact time as indicated for disinfecting solutions.    Kendra Ball , [birth year], 74 y.o., female MRN: [id number] Patient Care Team    Relationship Specialty Notifications Start End  Ma Hillock, DO PCP - General Family Medicine  12/30/14   Juanita Craver, MD Consulting Physician Gastroenterology  05/09/14   Excell Seltzer, MD (Inactive) Consulting Physician General Surgery  05/09/14   Brandon Melnick, [place]  Psychology  10/16/15   [name], MD  Psychiatry  12/12/16     Chief Complaint  Patient presents with  . Dizziness    Pt has had dizziness for a long time and is happening more often. Has headaches associated with dizziness. Not walking well and feels off balance. Pt sometimes cannot recognize her own hand writing and can tell she is not writing correctly when writing      Subjective: Kendra Ball is a 74 y.o.  Female Pt presents for an OV with complaints of worsening dizzy and lightheadedness.  Patient reports she has been dizzy, which she describes as lightheadedness for a few years.  However over the last few months she feels her dizziness has worsened.  She endorses feeling shaky.  Her family has noticed her hands are shaking more frequently.  She feels unsteady in her gait.  She reports she has fallen a few times, but not recently.  She endorses having sweats that occur in the evening however last 2 weeks.  She has had increased frequency and headaches that are now daily.  She states the pain will wrap around her head and can be from the front of her forehead to the back of her neck.  She states she does not feel like her hand is shaking when she is writing, but she has noticed a change in her penmanship, but she states this is  not all the time.  She sometimes feels a numbness tingling above her shoulder, she points to her right trapezium area.  She denies any unintentional weight loss, ringing in ears, visual changes.  She reports she has not had an eye exam in probably 2 years. She is established with psychiatry which prescribes her Effexor, Wellbutrin, trazodone and BuSpar.  She reports her Effexor dose was increased 2 weeks ago from 150 to 225 mg.  She has been prescribed Effexor for about 4 years.  She has been prescribed Wellbutrin for about 10 years and the dose has been unchanged for the majority of that time.  She has also been prescribed trazodone and BuSpar for 4 years.  Patient was started on these medications when she came out of rehab for her alcoholism.  She has remained alcohol free since her rehab 4 years ago. Colonoscopy up-to-date 5-year repeat due 2022 secondary to polyps. Mammogram is up-to-date and normal 05/15/2019. Patient is a smoker, last CT chest 2019. Patient has had a significant history of diffuse large B-cell lymphoma.  Last chemo treatment November 2019.  Depression screen [facility] 2/9 04/10/2018 02/16/2017 12/12/2016 10/16/2015  Decreased Interest 0 0 2 0  Down, Depressed, Hopeless 0 0 0 0  PHQ - 2 Score 0 0 2 0  Altered sleeping 0 - 0 -  Tired, decreased energy 2 - 3 -  Change in appetite 2 - 0 -  Feeling  bad or failure about yourself  0 - 1 -  Trouble concentrating 2 - 0 -  Moving slowly or fidgety/restless 0 - 0 -  Suicidal thoughts - - 0 -  PHQ-9 Score 6 - 6 -  Difficult doing work/chores Not difficult at all - Not difficult at all -  Some encounter information is confidential and restricted. Go to Review Flowsheets activity to see all data.  Some recent data might be hidden    Allergies  Allergen Reactions  . Morphine Sulfate Swelling and Other (See Comments)    Swelling around injection area   Social History   Social History Narrative   Kendra Ball lives in [place] with her  husband. She has 3 grown children and several grand children. She has joint custody of 3 of her grand children as her daughter has bipolar disorder and has needed assistance with children in past.   Past Medical History:  Diagnosis Date  . Adenopathy    right supraclavicular  . Anxiety   . Arthritis   . COPD (chronic obstructive pulmonary disease) ([place])   . DEPRESSION 08/26/2008  . Hx of colonic polyps    First noted on  colonoscopy 2012  . Hypercholesteremia   . HYPERTENSION 08/26/2008  . HYPOTHYROIDISM 08/26/2008  . Lymphoma ([employer]) 2019   B-cell  . Obesity   . Osteoporosis   . Substance abuse [facility])    inpatient and outpatient tx for substance abuse  . TOBACCO USER 12/25/2009   Past Surgical History:  Procedure Laterality Date  . AXILLARY LYMPH NODE BIOPSY Right 06/22/2017   Procedure: RIGHT AXILLARY LYMPH NODE BIOPSY;  Surgeon: [name], MD;  Location: [facility];  Service: General;  Laterality: Right;  . [place] SURGERY  2006  . CESAREAN SECTION    . COLON RESECTION N/A 05/09/2014   Procedure: LAPAROSCOPIC HAND-ASSISTED EXTENDED RIGHT COLECTOMY, LAPAROSCOPIC LYSIS OF ADHESIONS, SPLENIC FLEXURE MOBILIZATION.;  Surgeon: [name], MD;  Location: [facility];  Service: General;  Laterality: N/A;  . DILATION AND CURETTAGE OF UTERUS     x2  . FOREARM FRACTURE SURGERY     right  . GASTRIC BYPASS  2006  . IR IMAGING GUIDED PORT INSERTION  12/01/2017  . IR REMOVAL TUN ACCESS W/ PORT W/O FL MOD SED  07/03/2018  . MASS EXCISION Right 02/24/2017   Procedure: EXCISIONAL BIOPSY RIGHT SUPRA-CLAVICULAR NODE;  Surgeon: [name], MD;  Location: [place];  Service: ENT;  Laterality: Right;  . TONSILLECTOMY     Family History  Problem Relation Age of Onset  . Cancer Mother        colon  . Dementia Mother   . Depression Mother   . Heart disease Father 62  . Heart disease Sister 74       stents, pacemaker  . Depression Sister   . Heart disease Brother 30       CABG, MI   . Depression Brother   . Obesity Daughter   . Suicidality Daughter   . Bipolar disorder Daughter   . Obesity Son   . Mental illness Daughter        bipolar  . Obesity Daughter   . Depression Sister   . Dementia Paternal Aunt   . Alcohol abuse Maternal Uncle   . Colon cancer Maternal Aunt   . Stomach cancer Maternal Grandfather    Allergies as of 05/22/2019      Reactions   Morphine Sulfate Swelling, Other (See Comments)   Swelling  around injection area      Medication List       Accurate as of May 22, 2019 11:59 PM. If you have any questions, ask your nurse or doctor.        albuterol 108 (90 Base) MCG/ACT inhaler Commonly known as: VENTOLIN HFA Inhale 2 puffs into the lungs every 6 (six) hours as needed for wheezing or shortness of breath.   budesonide-formoterol 160-4.5 MCG/ACT inhaler Commonly known as: SYMBICORT Inhale 2 puffs into the lungs 2 (two) times daily.   buPROPion 150 MG 12 hr tablet Commonly known as: WELLBUTRIN SR Take 1 tablet (150 mg total) by mouth 2 (two) times daily.   busPIRone 10 MG tablet Commonly known as: BUSPAR Take 1 tablet (10 mg total) by mouth 3 (three) times daily.   ferrous fumarate 325 (106 Fe) MG Tabs tablet Commonly known as: HEMOCYTE - 106 mg FE Take 1 tablet by mouth daily. Reported on 10/06/2015   levothyroxine 100 MCG tablet Commonly known as: SYNTHROID Take 1 tablet (100 mcg total) by mouth daily before breakfast.   Magnesium 250 MG Tabs Take 250 mg by mouth 2 (two) times daily.   simvastatin 10 MG tablet Commonly known as: ZOCOR Take 1 tablet (10 mg total) by mouth at bedtime.   traZODone 100 MG tablet Commonly known as: DESYREL Take 2 tablets (200 mg total) by mouth at bedtime.   venlafaxine XR 75 MG 24 hr capsule Commonly known as: EFFEXOR-XR Take 3 capsules (225 mg total) by mouth daily with breakfast.   VITAMIN B-12 PO Take 1,000 mg by mouth daily.   VITAMIN D3 COMPLETE PO Take 1 tablet by mouth  daily.       All past medical history, surgical history, allergies, family history, immunizations andmedications were updated in the EMR today and reviewed under the history and medication portions of their EMR.     ROS: Negative, with the exception of above mentioned in HPI   Objective:  BP 106/70 (BP Location: Right Arm, Patient Position: Sitting, Cuff Size: Normal)   Pulse 75   Temp 97.7 F (36.5 C) (Temporal)   Resp 16   Ht '5\' 4"'$  (1.626 m)   Wt 151 lb 8 oz (68.7 kg)   SpO2 96%   BMI 26.00 kg/m  Body mass index is 26 kg/m.  Patient became dizzy with orthostatics-but no large change in blood pressure/heart rate Gen: Afebrile. No acute distress. Nontoxic in appearance, well developed, well nourished.  HENT: AT. . Bilateral TM visualized without erythema or effusion/normal. MMM, no oral lesions. Bilateral nares without erythema, inflammation or drainage. Throat without erythema or exudates.  No cough or shortness of breath. Eyes: Right pupil appears  larger than left pupil.  Round Reactive to light, Extraocular movements intact,  Conjunctiva without redness, discharge or icterus. Neck/lymp/endocrine: Supple, no lymphadenopathy, no thyromegaly CV: RRR, no edema Chest: CTAB, no wheeze or crackles. Good air movement, normal resp effort.  Skin: No rashes, purpura or petechiae.  Neuro/msk: Normal gait. PERLA. EOMi. Alert. Oriented x3 Cranial nerves II through XII intact. Muscle strength 5/5 bilateral upper and lower extremity. DTRs hyperactive bilaterally lower extremities.  Psych: Normal affect, dress and demeanor. Normal speech. Normal thought content and judgment.  No exam data present No results found. No results found for this or any previous visit (from the past 24 hour(s)).  Assessment/Plan: Kendra Ball is a 74 y.o. female present for OV for  New persistent daily headache/tremor/Other symptoms and signs involving the  nervous system/Lightheadedness/Night sweats/Gait  instability Patient has a history of lymphoma.  Now with worsening and daily persistent headache, development of tremor of her upper extremities, lightheadedness and gait instability is concerning.  Lengthy discussion today surrounding ruling out potential causes such as thyroid, B12, electrolyte disturbances, anemia or iron deficiencies versus infectious.  She is agreeable to this approach today. If labs are normal, would schedule MRI brain to rule out potential malignancy, normal pressure hydrocephalus, intracranial hypertension. - B12 - Comp Met (CMET) - TSH - T4, free - T3, free - Iron, TIBC and Ferritin Panel - CBC w/Diff - MR BRAIN W WO CONTRAST; Future -Once MRI results are received will discuss with patient and refer to appropriate specialist.  Discussed referral to movement disorder specialist with neurology likely.  She is agreeable to this.  H/O malignant lymphoma/smoker/pulmonary nodule Patient has a history of malignant B-cell lymphoma.  She is a every day smoker, greater than 50 pack years-with a history of pulmonary nodule. - ?  Horners  syndrome (left pupil appeared smaller) - B12 - Comp Met (CMET)  - TSH - T4, free - T3, free - Iron, TIBC and Ferritin Panel - CBC w/Diff - MR BRAIN W WO CONTRAST; Future   Reviewed expectations re: course of current medical issues.  Discussed self-management of symptoms.  Outlined signs and symptoms indicating need for more acute intervention.  Patient verbalized understanding and all questions were answered.  Patient received an After-Visit Summary.    Orders Placed This Encounter  Procedures  . MR BRAIN W WO CONTRAST  . CT Chest W Contrast  . B12  . Comp Met (CMET)  . TSH  . T4, free  . T3, free  . Iron, TIBC and Ferritin Panel  . CBC w/Diff   No orders of the defined types were placed in this encounter.  Referral Orders  No referral(s) requested today      Note is dictated utilizing voice recognition  software. Although note has been proof read prior to signing, occasional typographical errors still can be missed. If any questions arise, please do not hesitate to call for verification.   electronically signed by:  Howard Pouch, DO  [place]

## 2019-05-23 ENCOUNTER — Ambulatory Visit (INDEPENDENT_AMBULATORY_CARE_PROVIDER_SITE_OTHER): Payer: Medicare PPO | Admitting: Family Medicine

## 2019-05-23 ENCOUNTER — Encounter: Payer: Self-pay | Admitting: Family Medicine

## 2019-05-23 VITALS — BP 117/70 | Ht 64.0 in

## 2019-05-23 DIAGNOSIS — M8080XA Other osteoporosis with current pathological fracture, unspecified site, initial encounter for fracture: Secondary | ICD-10-CM | POA: Diagnosis not present

## 2019-05-23 DIAGNOSIS — R42 Dizziness and giddiness: Secondary | ICD-10-CM | POA: Insufficient documentation

## 2019-05-23 DIAGNOSIS — Z8572 Personal history of non-Hodgkin lymphomas: Secondary | ICD-10-CM

## 2019-05-23 DIAGNOSIS — R251 Tremor, unspecified: Secondary | ICD-10-CM

## 2019-05-23 DIAGNOSIS — R2681 Unsteadiness on feet: Secondary | ICD-10-CM | POA: Insufficient documentation

## 2019-05-23 HISTORY — DX: Tremor, unspecified: R25.1

## 2019-05-23 HISTORY — DX: Personal history of non-Hodgkin lymphomas: Z85.72

## 2019-05-23 LAB — COMPREHENSIVE METABOLIC PANEL
AG Ratio: 1.9 (calc) (ref 1.0–2.5)
ALT: 17 U/L (ref 6–29)
AST: 19 U/L (ref 10–35)
Albumin: 4 g/dL (ref 3.6–5.1)
Alkaline phosphatase (APISO): 61 U/L (ref 37–153)
BUN: 11 mg/dL (ref 7–25)
CO2: 26 mmol/L (ref 20–32)
Calcium: 9.1 mg/dL (ref 8.6–10.4)
Chloride: 105 mmol/L (ref 98–110)
Creat: 0.69 mg/dL (ref 0.60–0.93)
Globulin: 2.1 g/dL (calc) (ref 1.9–3.7)
Glucose, Bld: 92 mg/dL (ref 65–99)
Potassium: 4.4 mmol/L (ref 3.5–5.3)
Sodium: 141 mmol/L (ref 135–146)
Total Bilirubin: 0.5 mg/dL (ref 0.2–1.2)
Total Protein: 6.1 g/dL (ref 6.1–8.1)

## 2019-05-23 LAB — IRON,TIBC AND FERRITIN PANEL
%SAT: 41 % (calc) (ref 16–45)
Ferritin: 150 ng/mL (ref 16–288)
Iron: 136 ug/dL (ref 45–160)
TIBC: 330 mcg/dL (calc) (ref 250–450)

## 2019-05-23 MED ORDER — ALENDRONATE SODIUM 70 MG PO TABS: 70.0000 mg | ORAL_TABLET | ORAL | 11 refills | Status: DC

## 2019-05-23 NOTE — Progress Notes (Signed)
This visit occurred during the SARS-CoV-2 public health emergency.  Safety protocols were in place, including screening questions prior to the visit, additional usage of staff PPE, and extensive cleaning of exam room while observing appropriate contact time as indicated for disinfecting solutions.    Kendra Ball , 11/12/1945, 74 y.o., female MRN: [id number] Patient Care Team    Relationship Specialty Notifications Start End  Ma Hillock, DO PCP - General Family Medicine  12/30/14   Juanita Craver, MD Consulting Physician Gastroenterology  05/09/14   Excell Seltzer, MD (Inactive) Consulting Physician General Surgery  05/09/14   Brandon Melnick, [place]  Psychology  10/16/15   [name], MD  Psychiatry  12/12/16     Chief Complaint  Patient presents with  . discuss medication     Subjective: Pt presents for an OV to discuss her osteoporosis.  Bone scan completed 05/15/2019 with a T score of -3.1.  Patient has been on Fosamax many years ago.  She states she was on Fosamax about 2 years ago.  She has a significant medical history of B-cell lymphoma, last chemo treatment November 2019.  She is here today to discuss the different options of treatment and side effects/risks secondary to her prior medical history.  Depression screen [facility] 2/9 04/10/2018 02/16/2017 12/12/2016 10/16/2015  Decreased Interest 0 0 2 0  Down, Depressed, Hopeless 0 0 0 0  PHQ - 2 Score 0 0 2 0  Altered sleeping 0 - 0 -  Tired, decreased energy 2 - 3 -  Change in appetite 2 - 0 -  Feeling bad or failure about yourself  0 - 1 -  Trouble concentrating 2 - 0 -  Moving slowly or fidgety/restless 0 - 0 -  Suicidal thoughts - - 0 -  PHQ-9 Score 6 - 6 -  Difficult doing work/chores Not difficult at all - Not difficult at all -  Some encounter information is confidential and restricted. Go to Review Flowsheets activity to see all data.  Some recent data might be hidden    Allergies  Allergen Reactions  .  Morphine Sulfate Swelling and Other (See Comments)    Swelling around injection area   Social History   Social History Narrative   Kendra Ball lives in [place] with her husband. She has 3 grown children and several grand children. She has joint custody of 3 of her grand children as her daughter has bipolar disorder and has needed assistance with children in past.   Past Medical History:  Diagnosis Date  . Adenopathy    right supraclavicular  . Anxiety   . Arthritis   . Cancer ([place])   . COPD (chronic obstructive pulmonary disease) ([place])   . DEPRESSION 08/26/2008  . Hx of colonic polyps    First noted on  colonoscopy 2012  . Hypercholesteremia   . HYPERTENSION 08/26/2008  . HYPOTHYROIDISM 08/26/2008  . Lymphoma ([place])   . Obesity   . Osteoporosis   . Substance abuse [facility])    inpatient and outpatient tx for substance abuse  . TOBACCO USER 12/25/2009   Past Surgical History:  Procedure Laterality Date  . AXILLARY LYMPH NODE BIOPSY Right 06/22/2017   Procedure: RIGHT AXILLARY LYMPH NODE BIOPSY;  Surgeon: [name], MD;  Location: [facility];  Service: General;  Laterality: Right;  . [place] SURGERY  2006  . CESAREAN SECTION    . COLON RESECTION N/A 05/09/2014   Procedure: LAPAROSCOPIC HAND-ASSISTED EXTENDED RIGHT COLECTOMY, LAPAROSCOPIC LYSIS OF  ADHESIONS, SPLENIC FLEXURE MOBILIZATION.;  Surgeon: [name], MD;  [facility];  Service: General;  Laterality: N/A;  . DILATION AND CURETTAGE OF UTERUS     x2  . FOREARM FRACTURE SURGERY     right  . GASTRIC BYPASS  2006  . IR IMAGING GUIDED PORT INSERTION  12/01/2017  . IR REMOVAL TUN ACCESS W/ PORT W/O FL MOD SED  07/03/2018  . MASS EXCISION Right 02/24/2017   Procedure: EXCISIONAL BIOPSY RIGHT SUPRA-CLAVICULAR NODE;  Surgeon: [name], MD;  Location: [place];  Service: ENT;  Laterality: Right;  . TONSILLECTOMY     Family History  Problem Relation Age of Onset  . Cancer Mother        colon  . Dementia  Mother   . Depression Mother   . Heart disease Father 54  . Heart disease Sister 30       stents, pacemaker  . Depression Sister   . Heart disease Brother 67       CABG, MI  . Depression Brother   . Obesity Daughter   . Suicidality Daughter   . Bipolar disorder Daughter   . Obesity Son   . Mental illness Daughter        bipolar  . Obesity Daughter   . Depression Sister   . Dementia Paternal Aunt   . Alcohol abuse Maternal Uncle   . Colon cancer Maternal Aunt   . Stomach cancer Maternal Grandfather    Allergies as of 05/23/2019      Reactions   Morphine Sulfate Swelling, Other (See Comments)   Swelling around injection area      Medication List       Accurate as of May 23, 2019 11:00 AM. If you have any questions, ask your nurse or doctor.        albuterol 108 (90 Base) MCG/ACT inhaler Commonly known as: VENTOLIN HFA Inhale 2 puffs into the lungs every 6 (six) hours as needed for wheezing or shortness of breath.   budesonide-formoterol 160-4.5 MCG/ACT inhaler Commonly known as: SYMBICORT Inhale 2 puffs into the lungs 2 (two) times daily.   buPROPion 150 MG 12 hr tablet Commonly known as: WELLBUTRIN SR Take 1 tablet (150 mg total) by mouth 2 (two) times daily.   busPIRone 10 MG tablet Commonly known as: BUSPAR Take 1 tablet (10 mg total) by mouth 3 (three) times daily.   ferrous fumarate 325 (106 Fe) MG Tabs tablet Commonly known as: HEMOCYTE - 106 mg FE Take 1 tablet by mouth daily. Reported on 10/06/2015   levothyroxine 100 MCG tablet Commonly known as: SYNTHROID Take 1 tablet (100 mcg total) by mouth daily before breakfast.   Magnesium 250 MG Tabs Take 250 mg by mouth 2 (two) times daily.   simvastatin 10 MG tablet Commonly known as: ZOCOR Take 1 tablet (10 mg total) by mouth at bedtime.   traZODone 100 MG tablet Commonly known as: DESYREL Take 2 tablets (200 mg total) by mouth at bedtime.   venlafaxine XR 75 MG 24 hr capsule Commonly known  as: EFFEXOR-XR Take 3 capsules (225 mg total) by mouth daily with breakfast.   VITAMIN B-12 PO Take 1,000 mg by mouth daily.   VITAMIN D3 COMPLETE PO Take 1 tablet by mouth daily.       All past medical history, surgical history, allergies, family history, immunizations andmedications were updated in the EMR today and reviewed under the history and medication portions of their EMR.  ROS: Negative, with the exception of above mentioned in HPI   Objective:  BP 117/70   Ht 5\' 4"  (1.626 m)   BMI 26.00 kg/m  Body mass index is 26 kg/m. Gen: Afebrile. No acute distress. Nontoxic in appearance, well developed, well nourished.  HENT: AT. [place].  Eyes:Pupils Equal Round Reactive to light, Extraocular movements intact,  Conjunctiva without redness, discharge or icterus. Neuro: Alert. Oriented x3  Psych: Normal affect, dress and demeanor. Normal speech. Normal thought content and judgment.  No exam data present No results found.  Assessment/Plan: MARLEINA METTER is a 74 y.o. female present for OV for  Other osteoporosis with current pathological fracture, initial encounter Patient has a significant T score of -3.1 and has had multiple fractures over the last few years. We discussed different options for her on treatment for osteoporosis.  She does have a significant history of lymphoma which could place her at higher risk for jaw necrosis.  She reports today she believes she was only on the Fosamax for 2 years.  She would rather return to Fosamax use once weekly and use other agents. I have encouraged her to also speak with her dentist and inform them that she has started Fosamax. Fosamax 70 mg daily prescribed Repeat DEXA in 2 years.-Due 04/2021   Reviewed expectations re: course of current medical issues.  Discussed self-management of symptoms.  Outlined signs and symptoms indicating need for more acute intervention.  Patient verbalized understanding and all questions were  answered.  Patient received an After-Visit Summary.    No orders of the defined types were placed in this encounter.  Meds ordered this encounter  Medications  . alendronate (FOSAMAX) 70 MG tablet    Sig: Take 1 tablet (70 mg total) by mouth every 7 (seven) days. Take with a full glass of water on an empty stomach.    Dispense:  4 tablet    Refill:  11      Note is dictated utilizing voice recognition software. Although note has been proof read prior to signing, occasional typographical errors still can be missed. If any questions arise, please do not hesitate to call for verification.   electronically signed by:  Howard Pouch, DO  [place]

## 2019-05-23 NOTE — Patient Instructions (Signed)
Osteoporosis  Osteoporosis happens when your bones get thin and weak. This can cause your bones to break (fracture) more easily. You can do things at home to make your bones stronger. Follow these instructions at home:  Activity  Exercise as told by your doctor. Ask your doctor what activities are safe for you. You should do: ? Exercises that make your muscles work to hold your body weight up (weight-bearing exercises). These include tai chi, yoga, and walking. ? Exercises to make your muscles stronger. One example is lifting weights. Lifestyle  Limit alcohol intake to no more than 1 drink a day for nonpregnant women and 2 drinks a day for men. One drink equals 12 oz of beer, 5 oz of wine, or 1 oz of hard liquor.  Do not use any products that have nicotine or tobacco in them. These include cigarettes and e-cigarettes. If you need help quitting, ask your doctor. Preventing falls  Use tools to help you move around (mobility aids) as needed. These include canes, walkers, scooters, and crutches.  Keep rooms well-lit and free of clutter.  Put away things that could make you trip. These include cords and rugs.  Install safety rails on stairs. Install grab bars in bathrooms.  Use rubber mats in slippery areas, like bathrooms.  Wear shoes that: ? Fit you well. ? Support your feet. ? Have closed toes. ? Have rubber soles or low heels.  Tell your doctor about all of the medicines you are taking. Some medicines can make you more likely to fall. General instructions  Eat plenty of calcium and vitamin D. These nutrients are good for your bones. Good sources of calcium and vitamin D include: ? Some fatty fish, such as salmon and tuna. ? Foods that have calcium and vitamin D added to them (fortified foods). For example, some breakfast cereals are fortified with calcium and vitamin D. ? Egg yolks. ? Cheese. ? Liver.  Take over-the-counter and prescription medicines only as told by your  doctor.  Keep all follow-up visits as told by your doctor. This is important. Contact a doctor if:  You have not been tested (screened) for osteoporosis and you are: ? A woman who is age 65 or older. ? A man who is age 70 or older. Get help right away if:  You fall.  You get hurt. Summary  Osteoporosis happens when your bones get thin and weak.  Weak bones can break (fracture) more easily.  Eat plenty of calcium and vitamin D. These nutrients are good for your bones.  Tell your doctor about all of the medicines that you take. This information is not intended to replace advice given to you by your health care provider. Make sure you discuss any questions you have with your health care provider. Document Revised: 03/24/2017 Document Reviewed: 02/03/2017 Elsevier Patient Education  2020 Elsevier Inc.  

## 2019-06-06 ENCOUNTER — Ambulatory Visit (HOSPITAL_COMMUNITY): Payer: Medicare PPO | Admitting: Psychiatry

## 2019-06-06 ENCOUNTER — Encounter (HOSPITAL_COMMUNITY): Payer: Self-pay | Admitting: Psychiatry

## 2019-06-06 ENCOUNTER — Other Ambulatory Visit: Payer: Self-pay

## 2019-06-06 NOTE — Progress Notes (Unsigned)
Change in medicine was good. Les depressed, not sleeping, more motivated, decreased anxiety. She is working on her hobbies again. She is feeling more hopefully. She did 1st vaccine. She wants to be close to get her family. Kendra Ball feels really good and has no concerns at this time. She denies SI/HI. Her sleep and appetite are good. She tried light therapy for 2 weeks and it didn't work so she stopped.   Continue all meds Review labs 5/6 @ 10.30am

## 2019-06-25 ENCOUNTER — Ambulatory Visit
Admission: RE | Admit: 2019-06-25 | Discharge: 2019-06-25 | Disposition: A | Discharge status: Home / Self Care | Payer: Medicare PPO | Source: Ambulatory Visit | Attending: Family Medicine | Admitting: Family Medicine

## 2019-06-25 ENCOUNTER — Other Ambulatory Visit: Payer: Self-pay

## 2019-06-25 DIAGNOSIS — R42 Dizziness and giddiness: Secondary | ICD-10-CM

## 2019-06-25 DIAGNOSIS — R911 Solitary pulmonary nodule: Secondary | ICD-10-CM

## 2019-06-25 DIAGNOSIS — Z8572 Personal history of non-Hodgkin lymphomas: Secondary | ICD-10-CM

## 2019-06-25 DIAGNOSIS — R2681 Unsteadiness on feet: Secondary | ICD-10-CM

## 2019-06-25 DIAGNOSIS — F17200 Nicotine dependence, unspecified, uncomplicated: Secondary | ICD-10-CM

## 2019-06-25 DIAGNOSIS — R519 Headache, unspecified: Secondary | ICD-10-CM | POA: Diagnosis not present

## 2019-06-25 DIAGNOSIS — R29818 Other symptoms and signs involving the nervous system: Secondary | ICD-10-CM

## 2019-06-25 DIAGNOSIS — R251 Tremor, unspecified: Secondary | ICD-10-CM

## 2019-06-25 DIAGNOSIS — G4452 New daily persistent headache (NDPH): Secondary | ICD-10-CM

## 2019-06-25 MED ORDER — IOPAMIDOL (ISOVUE-300) INJECTION 61%
75.0000 mL | Freq: Once | INTRAVENOUS | Status: AC | PRN
  Administered 2019-06-25: 75 mL via INTRAVENOUS

## 2019-06-25 MED ORDER — GADOBENATE DIMEGLUMINE 529 MG/ML IV SOLN
15.0000 mL | Freq: Once | INTRAVENOUS | Status: AC | PRN
  Administered 2019-06-25: 15:00:00 15 mL via INTRAVENOUS

## 2019-06-26 ENCOUNTER — Telehealth: Payer: Self-pay | Admitting: Family Medicine

## 2019-06-26 DIAGNOSIS — R42 Dizziness and giddiness: Secondary | ICD-10-CM

## 2019-06-26 DIAGNOSIS — R251 Tremor, unspecified: Secondary | ICD-10-CM

## 2019-06-26 DIAGNOSIS — G4452 New daily persistent headache (NDPH): Secondary | ICD-10-CM

## 2019-06-26 DIAGNOSIS — R2681 Unsteadiness on feet: Secondary | ICD-10-CM

## 2019-06-27 NOTE — Telephone Encounter (Signed)
Pt was called and given information, she verbalized understanding  

## 2019-06-27 NOTE — Telephone Encounter (Signed)
Please inform patient Her CT chest is stable and no further follow-ups are recommended per radiology guidelines. Her brain MRI is normal for age.  No acute causes for her increase in headaches, tremor or dizziness.   I have placed the referral to neurology to further evaluate her symptoms.  She should be receiving a call to have that scheduled from their office.

## 2019-06-28 ENCOUNTER — Other Ambulatory Visit: Payer: Medicare PPO

## 2019-07-25 ENCOUNTER — Other Ambulatory Visit (HOSPITAL_COMMUNITY): Payer: Self-pay | Admitting: Psychiatry

## 2019-07-25 DIAGNOSIS — F411 Generalized anxiety disorder: Secondary | ICD-10-CM

## 2019-07-25 DIAGNOSIS — F1020 Alcohol dependence, uncomplicated: Secondary | ICD-10-CM

## 2019-07-25 DIAGNOSIS — F331 Major depressive disorder, recurrent, moderate: Secondary | ICD-10-CM

## 2019-07-27 ENCOUNTER — Other Ambulatory Visit (HOSPITAL_COMMUNITY): Payer: Self-pay | Admitting: Psychiatry

## 2019-07-27 DIAGNOSIS — F331 Major depressive disorder, recurrent, moderate: Secondary | ICD-10-CM

## 2019-07-27 DIAGNOSIS — F411 Generalized anxiety disorder: Secondary | ICD-10-CM

## 2019-08-06 ENCOUNTER — Ambulatory Visit (INDEPENDENT_AMBULATORY_CARE_PROVIDER_SITE_OTHER): Payer: Medicare PPO | Admitting: Neurology

## 2019-08-06 ENCOUNTER — Other Ambulatory Visit: Payer: Self-pay

## 2019-08-06 ENCOUNTER — Encounter: Payer: Self-pay | Admitting: Neurology

## 2019-08-06 VITALS — BP 130/77 | HR 80 | Temp 97.4°F | Ht 64.0 in | Wt 154.5 lb

## 2019-08-06 DIAGNOSIS — R269 Unspecified abnormalities of gait and mobility: Secondary | ICD-10-CM | POA: Diagnosis not present

## 2019-08-06 DIAGNOSIS — R202 Paresthesia of skin: Secondary | ICD-10-CM | POA: Diagnosis not present

## 2019-08-06 HISTORY — DX: Paresthesia of skin: R20.2

## 2019-08-06 HISTORY — DX: Unspecified abnormalities of gait and mobility: R26.9

## 2019-08-06 NOTE — Progress Notes (Signed)
PATIENT: Kendra Ball DOB: 1945-09-20  Chief Complaint  Patient presents with  . Gait Problem    Back in March, she was noticing episodes of stumbling steps and veering off to the right. States these symptoms have since improved and only rarely happen now.  . Headache    She was having a daily dull headahce that would last all day. She is now only having them every other day and they are only lasting a couple of hours. The pain/pressure is not severe enough to take any type of medication. She will sometimes have lightheadedness associated with the headaches. She usually lays down to rest which helps some.   . Teeth Chattering    She has sporadic teeth chattering in the absence of feeling cold.      HISTORICAL  Kendra Ball is a 74 year old female, seen in request by her primary care physician Dr. Raoul Pitch, [place] for evaluation of constellation of complaints, gait abnormality, frequent headaches, uncontrollable teeth chattering.  Initial evaluation was on August 06, 2019.  I have reviewed and summarized the referring note from the referring physician.  She had a past medical history of depression, is on polypharmacy treatment, taking Wellbutrin SR 150 mg twice a day, BuSpar 10 mg 3 times a day, trazodone 200 mg every night, Effexor to 25 mg every morning.  In March 2020, she began to complains of frequent headaches, dull achy holoacranial headaches, that happened on a daily basis.  Over the past few weeks, her headache has much improved, it only happens intermittently, lasting couple hours, is not severe enough for her to take any medication for that, sometimes she has lightheaded sensation associated with her headache I personally reviewed MRI of the brain without contrast in March 2021, that was normal, no acute abnormalities.  She also complains of new onset neck pain, radiating to her right shoulder, numbness of right shoulder,  In addition she complains of intermittent  uncontrollable teeth chattering, in the absence of feeling cold, lasting for few minutes, no loss of consciousness  Laboratory evaluation January 2021, normal CBC, iron panel, thyroid functional test, B12, CMP.  REVIEW OF SYSTEMS: Full 14 system review of systems performed and notable only for as above All other review of systems were negative.  ALLERGIES: Allergies  Allergen Reactions  . Morphine Sulfate Swelling and Other (See Comments)    Swelling around injection area    HOME MEDICATIONS: Current Outpatient Medications  Medication Sig Dispense Refill  . albuterol (VENTOLIN HFA) 108 (90 Base) MCG/ACT inhaler Inhale 2 puffs into the lungs every 6 (six) hours as needed for wheezing or shortness of breath. 8 g 2  . alendronate (FOSAMAX) 70 MG tablet Take 1 tablet (70 mg total) by mouth every 7 (seven) days. Take with a full glass of water on an empty stomach. 4 tablet 11  . budesonide-formoterol (SYMBICORT) 160-4.5 MCG/ACT inhaler Inhale 2 puffs into the lungs 2 (two) times daily. 1 Inhaler 6  . buPROPion (WELLBUTRIN SR) 150 MG 12 hr tablet Take 1 tablet (150 mg total) by mouth 2 (two) times daily. 180 tablet 0  . busPIRone (BUSPAR) 10 MG tablet TAKE 1 TABLET BY MOUTH THREE TIMES A DAY 90 tablet 0  . Cyanocobalamin (VITAMIN B-12 PO) Take 1,000 mg by mouth daily.     . ferrous fumarate (HEMOCYTE - 106 MG FE) 325 (106 FE) MG TABS Take 1 tablet by mouth daily. Reported on 10/06/2015    . levothyroxine (SYNTHROID) 100 MCG  tablet Take 1 tablet (100 mcg total) by mouth daily before breakfast. 90 tablet 3  . Magnesium 250 MG TABS Take 250 mg by mouth daily.     . Multiple Vitamins-Minerals (VITAMIN D3 COMPLETE PO) Take 1 tablet by mouth daily.    . simvastatin (ZOCOR) 10 MG tablet Take 1 tablet (10 mg total) by mouth at bedtime. 90 tablet 3  . traZODone (DESYREL) 100 MG tablet Take 2 tablets (200 mg total) by mouth at bedtime. 180 tablet 0  . venlafaxine XR (EFFEXOR-XR) 75 MG 24 hr capsule TAKE  3 CAPSULES (225 MG TOTAL) BY MOUTH DAILY WITH BREAKFAST. 90 capsule 0   No current facility-administered medications for this visit.    PAST MEDICAL HISTORY: Past Medical History:  Diagnosis Date  . Adenopathy    right supraclavicular  . Anxiety   . Arthritis   . COPD (chronic obstructive pulmonary disease) ([place])   . DEPRESSION 08/26/2008  . Headache   . Hx of colonic polyps    First noted on  colonoscopy 2012  . Hypercholesteremia   . HYPERTENSION 08/26/2008  . HYPOTHYROIDISM 08/26/2008  . Lymphoma ([place]) 2019   B-cell  . Obesity   . Osteoporosis   . Substance abuse [facility])    inpatient and outpatient tx for substance abuse  . TOBACCO USER 12/25/2009    PAST SURGICAL HISTORY: Past Surgical History:  Procedure Laterality Date  . AXILLARY LYMPH NODE BIOPSY Right 06/22/2017   Procedure: RIGHT AXILLARY LYMPH NODE BIOPSY;  Surgeon: [name], MD;  [facility];  Service: General;  Laterality: Right;  . [place] SURGERY  2006  . CESAREAN SECTION    . COLON RESECTION N/A 05/09/2014   Procedure: LAPAROSCOPIC HAND-ASSISTED EXTENDED RIGHT COLECTOMY, LAPAROSCOPIC LYSIS OF ADHESIONS, SPLENIC FLEXURE MOBILIZATION.;  Surgeon: [name], MD;  [facility];  Service: General;  Laterality: N/A;  . DILATION AND CURETTAGE OF UTERUS     x2  . FOREARM FRACTURE SURGERY     right  . GASTRIC BYPASS  2006  . IR IMAGING GUIDED PORT INSERTION  12/01/2017  . IR REMOVAL TUN ACCESS W/ PORT W/O FL MOD SED  07/03/2018  . MASS EXCISION Right 02/24/2017   Procedure: EXCISIONAL BIOPSY RIGHT SUPRA-CLAVICULAR NODE;  Surgeon: [name], MD;  Location: [place];  Service: ENT;  Laterality: Right;  . TONSILLECTOMY      FAMILY HISTORY: Family History  Problem Relation Age of Onset  . Cancer Mother        colon  . Depression Mother   . Alzheimer's disease Mother        Dementia  . Heart disease Father 67  . Heart disease Sister 62       stents, pacemaker  . Depression Sister    . Heart disease Brother 39       CABG, MI  . Depression Brother   . Obesity Daughter   . Suicidality Daughter   . Bipolar disorder Daughter   . Obesity Son   . Mental illness Daughter        bipolar  . Obesity Daughter   . Depression Sister   . Dementia Paternal Aunt   . Alcohol abuse Maternal Uncle   . Colon cancer Maternal Aunt   . Stomach cancer Maternal Grandfather     SOCIAL HISTORY: Social History   Socioeconomic History  . Marital status: Married    Spouse name: Not on file  . Number of children: 3  . Years of  education: Not on file  . Highest education level: Not on file  Occupational History  . Occupation: RETIRED    Employer: [employer]  Tobacco Use  . Smoking status: Current Every Day Smoker    Packs/day: 1.00    Years: 50.00    Pack years: 50.00    Types: Cigarettes  . Smokeless tobacco: Never Used  Substance and Sexual Activity  . Alcohol use: No    Comment: quit on Aug 27, 2015  . Drug use: No    Frequency: 20.0 times per week    Comment: last used May 2017  . Sexual activity: Not Currently    Partners: Male    Birth control/protection: Post-menopausal  Other Topics Concern  . Not on file  Social History Narrative   Ms. Sisk lives in [place] with her husband. She has 3 grown children and several grand children. She has joint custody of 3 of her grand children as her daughter has bipolar disorder and has needed assistance with children in past.   1-2 cups coffee, 3-6 cups tea per day.   Right-handed.   Social Determinants of Health   Financial Resource Strain:   . Difficulty of Paying Living Expenses:   Food Insecurity:   . Worried About Charity fundraiser in the Last Year:   . Arboriculturist in the Last Year:   Transportation Needs:   . Film/video editor (Medical):   [name] Lack of Transportation (Non-Medical):   Physical Activity:   . Days of Exercise per Week:   . Minutes of Exercise per Session:   Stress:   .  Feeling of Stress :   Social Connections:   . Frequency of Communication with Friends and Family:   . Frequency of Social Gatherings with Friends and Family:   . Attends Religious Services:   . Active Member of Clubs or Organizations:   . Attends Archivist Meetings:   [name] Marital Status:   Intimate Partner Violence:   . Fear of Current or Ex-Partner:   . Emotionally Abused:   [name] Physically Abused:   . Sexually Abused:      PHYSICAL EXAM   Vitals:   08/06/19 1531  BP: 130/77  Pulse: 80  Temp: (!) 97.4 F (36.3 C)  Weight: 154 lb 8 oz (70.1 kg)  Height: '5\' 4"'$  (1.626 m)    Not recorded      Body mass index is 26.52 kg/m.  PHYSICAL EXAMNIATION:  Gen: NAD, conversant, well nourised, well groomed                     Cardiovascular: Regular rate rhythm, no peripheral edema, warm, nontender. Eyes: Conjunctivae clear without exudates or hemorrhage Neck: Supple, no carotid bruits. Pulmonary: Clear to auscultation bilaterally   NEUROLOGICAL EXAM:  MENTAL STATUS: Speech:    Speech is normal; fluent and spontaneous with normal comprehension.  Cognition:     Orientation to time, place and person     Normal recent and remote memory     Normal Attention span and concentration     Normal Language, naming, repeating,spontaneous speech     Fund of knowledge   CRANIAL NERVES: CN II: Visual fields are full to confrontation. Pupils are round equal and briskly reactive to light. CN III, IV, VI: extraocular movement are normal. No ptosis. CN V: Facial sensation is intact to light touch CN VII: Face is symmetric with normal eye closure  CN VIII: Hearing  is normal to causal conversation. CN IX, X: Phonation is normal. CN XI: Head turning and shoulder shrug are intact  MOTOR: There is no pronator drift of out-stretched arms. Muscle bulk and tone are normal. Muscle strength is normal.  REFLEXES: Reflexes are 2+ and symmetric at the biceps, triceps, knees, and ankles.  Plantar responses are flexor.  SENSORY: Intact to light touch, pinprick and vibratory sensation are intact in fingers and toes.  COORDINATION: There is no trunk or limb dysmetria noted.  GAIT/STANCE: Posture is normal. Gait is steady with normal steps.   DIAGNOSTIC DATA (LABS, IMAGING, TESTING) - I reviewed patient records, labs, notes, testing and imaging myself where available.   ASSESSMENT AND PLAN  Kendra Ball is a 74 y.o. female   New onset of neck pain, radiating pain to right shoulder  MRI of cervical spine to rule out cervical radiculopathy  New onset headaches  Normal neurological examination, MRI of brain  Laboratory evaluations including ESR C-reactive protein to rule out temporal arteritis   Marcial Pacas, M.D. Ph.D.  [facility] [address] [place], [place] [address] Ph: [phone] Fax: [phone]  CC: Howard Pouch A, DO

## 2019-08-12 ENCOUNTER — Telehealth: Payer: Self-pay | Admitting: Neurology

## 2019-08-12 NOTE — Telephone Encounter (Signed)
Mcarthur Rossetti Josem Kaufmann: [id number] (exp. 08/12/19 to 09/11/19) order sent to GI. They will reach out to the patient to schedule.

## 2019-08-13 LAB — MULTIPLE MYELOMA PANEL, SERUM
Albumin SerPl Elph-Mcnc: 3.6 g/dL (ref 2.9–4.4)
Albumin/Glob SerPl: 1.4 (ref 0.7–1.7)
Alpha 1: 0.3 g/dL (ref 0.0–0.4)
Alpha2 Glob SerPl Elph-Mcnc: 0.9 g/dL (ref 0.4–1.0)
B-Globulin SerPl Elph-Mcnc: 1 g/dL (ref 0.7–1.3)
Gamma Glob SerPl Elph-Mcnc: 0.5 g/dL (ref 0.4–1.8)
Globulin, Total: 2.6 g/dL (ref 2.2–3.9)
IgA/Immunoglobulin A, Serum: 129 mg/dL (ref 64–422)
IgG (Immunoglobin G), Serum: 570 mg/dL — ABNORMAL LOW (ref 586–1602)
IgM (Immunoglobulin M), Srm: 22 mg/dL — ABNORMAL LOW (ref 26–217)
Total Protein: 6.2 g/dL (ref 6.0–8.5)

## 2019-08-13 LAB — SEDIMENTATION RATE: Sed Rate: 7 mm/hr (ref 0–40)

## 2019-08-13 LAB — ANA W/REFLEX IF POSITIVE: Anti Nuclear Antibody (ANA): NEGATIVE

## 2019-08-13 LAB — HEMOGLOBIN A1C
Est. average glucose Bld gHb Est-mCnc: 97 mg/dL
Hgb A1c MFr Bld: 5 % (ref 4.8–5.6)

## 2019-08-13 LAB — C-REACTIVE PROTEIN: CRP: 4 mg/L (ref 0–10)

## 2019-08-15 IMAGING — CT NM PET TUM IMG RESTAG (PS) SKULL BASE T - THIGH
1 of 7 series · 1 of 25 positions shown · non-contrast
Comparison: PET-CT 11/28/2017 and 07/19/2017.

CLINICAL DATA: Subsequent treatment strategy for large B-cell
lymphoma.

EXAM:
NUCLEAR MEDICINE PET SKULL BASE TO THIGH
TECHNIQUE: 8.09 mCi F-18 FDG was injected intravenously. Full-ring PET imaging
was performed from the skull base to thigh after the radiotracer. CT
data was obtained and used for attenuation correction and anatomic
localization.
Fasting blood glucose: 92 mg/dl

[Series 4: ct sk_thigh 5.0 b31f · axial · 5.0mm · 0.98mm/px · 1 of 220 slices shown]
[im 220/220  brain]
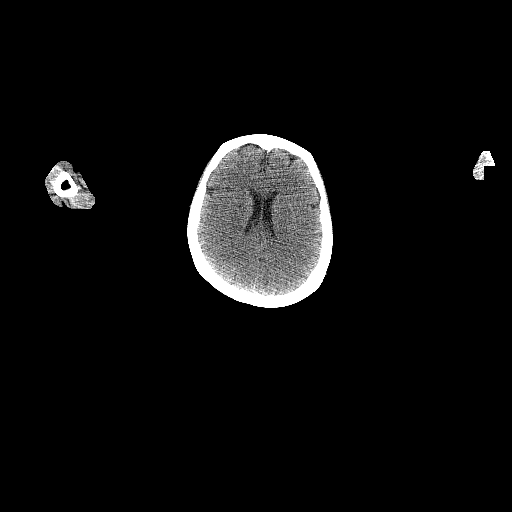

[1 of 25 positions shown; findings below may reference images not displayed]

FINDINGS: Mediastinal blood pool activity: SUV max

NECK:

No hypermetabolic cervical lymph nodes are identified.No suspicious
activity associated with the pharyngeal mucosal space. There is
mildly asymmetric activity in the left nasopharynx (SUV max 5.2)
which has improved compared with the prior study. There is also
mildly asymmetric vocal cord activity which may be physiologic.

Incidental CT findings: Left maxillary sinus mucous retention cysts
and bilateral carotid atherosclerosis.

CHEST:

The previously demonstrated hypermetabolic supraclavicular and
axillary lymph nodes bilaterally have resolved. There are small
residual right axillary lymph nodes with activity equal to or less
than blood pool. There are no hypermetabolic mediastinal or hilar
lymph nodes. There is no suspicious pulmonary activity.

Incidental CT findings: Right IJ Port-A-Cath extends to the superior
cavoatrial junction. There is atherosclerosis of the aorta, great
vessels and coronary arteries. There are stable calcified left hilar
lymph nodes and a calcified left lower lobe granuloma.

ABDOMEN/PELVIS:

There is no hypermetabolic activity within the liver, adrenal
glands, spleen or pancreas. There is no hypermetabolic nodal
activity. The previously demonstrated hypermetabolic inguinal lymph
nodes bilaterally have resolved.

Incidental CT findings: Calcified gallstones, postsurgical changes
related to gastric bypass and diffuse aortic and branch vessel
atherosclerosis are again noted.

SKELETON:

Diffusely increased marrow signal attributed to therapy. There is no
residual focal hypermetabolic marrow activity. Specifically, no
residual abnormal activity seen anteriorly in the right scapula.

Incidental CT findings: none
IMPRESSION: 1. Interval resolution of previously demonstrated hypermetabolic
supraclavicular, axillary and inguinal adenopathy bilaterally.
[HOSPITAL] [DATE].
2. No residual focal hypermetabolic osseous lesions. Mild diffuse
metabolic activity throughout the bone marrow attributed to interval
therapy.
3. Sequela of prior granulomatous disease. Aortic Atherosclerosis
([id number]).

## 2019-08-19 ENCOUNTER — Inpatient Hospital Stay: Payer: Medicare PPO | Attending: Oncology | Admitting: Oncology

## 2019-08-19 ENCOUNTER — Other Ambulatory Visit: Payer: Self-pay

## 2019-08-19 VITALS — BP 126/62 | HR 73 | Temp 99.1°F | Resp 17 | Ht 64.0 in | Wt 153.0 lb

## 2019-08-19 DIAGNOSIS — Z79899 Other long term (current) drug therapy: Secondary | ICD-10-CM | POA: Diagnosis not present

## 2019-08-19 DIAGNOSIS — E039 Hypothyroidism, unspecified: Secondary | ICD-10-CM | POA: Insufficient documentation

## 2019-08-19 DIAGNOSIS — C8580 Other specified types of non-Hodgkin lymphoma, unspecified site: Secondary | ICD-10-CM

## 2019-08-19 DIAGNOSIS — Z800 Family history of malignant neoplasm of digestive organs: Secondary | ICD-10-CM | POA: Insufficient documentation

## 2019-08-19 DIAGNOSIS — Z808 Family history of malignant neoplasm of other organs or systems: Secondary | ICD-10-CM | POA: Insufficient documentation

## 2019-08-19 DIAGNOSIS — I10 Essential (primary) hypertension: Secondary | ICD-10-CM | POA: Insufficient documentation

## 2019-08-19 DIAGNOSIS — R200 Anesthesia of skin: Secondary | ICD-10-CM | POA: Insufficient documentation

## 2019-08-19 DIAGNOSIS — C8330 Diffuse large B-cell lymphoma, unspecified site: Secondary | ICD-10-CM | POA: Insufficient documentation

## 2019-08-19 DIAGNOSIS — E7800 Pure hypercholesterolemia, unspecified: Secondary | ICD-10-CM | POA: Diagnosis not present

## 2019-08-19 NOTE — Progress Notes (Signed)
[facility] OFFICE PROGRESS NOTE   Diagnosis: Non-Hodgkin's lymphoma  INTERVAL HISTORY:   Kendra Ball returns as scheduled.  Good appetite.  No fever or night sweats.  She feels well.  No palpable lymph nodes.  She has occasional "numbness "at the right upper back.  No arm numbness or weakness. Kendra Ball continues smoking.  She has received the COVID-19 vaccine. Objective:  Vital signs in last 24 hours:  Blood pressure 126/62, pulse 73, temperature 99.1 F (37.3 C), temperature source Temporal, resp. rate 17, height 5\' 4"  (1.626 m), weight 153 lb (69.4 kg), SpO2 97 %.    HEENT: Neck without mass, no mass at the right trapezius or upper back. Lymphatics: No cervical, supraclavicular, axillary, or inguinal nodes Cardio: Regular rate and rhythm GI: No hepatosplenomegaly, no mass, nontender Vascular: No leg edema  Lab Results:  Lab Results  Component Value Date   WBC 6.2 05/22/2019   HGB 14.2 05/22/2019   HCT 42.9 05/22/2019   MCV 98.9 05/22/2019   PLT 244.0 05/22/2019   NEUTROABS 4.7 05/22/2019    CMP  Lab Results  Component Value Date   NA 141 05/22/2019   K 4.4 05/22/2019   CL 105 05/22/2019   CO2 26 05/22/2019   GLUCOSE 92 05/22/2019   BUN 11 05/22/2019   CREATININE 0.69 05/22/2019   CALCIUM 9.1 05/22/2019   PROT 6.2 08/06/2019   ALBUMIN 4.1 01/29/2019   AST 19 05/22/2019   ALT 17 05/22/2019   ALKPHOS 66 01/29/2019   BILITOT 0.5 05/22/2019   GFRNONAA >60 07/03/2018   GFRAA >60 07/03/2018     Medications: I have reviewed the patient's current medications.   Assessment/Plan: 1. Non-Hodgkin's lymphoma, diffuse large B-cell lymphoma, CD20 positive  Ultrasound of the neck 02/18/2017-right supraclavicular adenopathy with at least 3 nodes, largest measuring 1.3 x 1.1 x 1.4 cm; 2 adjacent hypoechoic right occipital lymph nodes  Excisional biopsy right supraclavicular lymphadenopathy [id number] (Dr. Deetta Perla lymphoid proliferation.  Necrotic soft tissue with associated acute and chronic inflammation.By flow cytometry no monoclonal B-cell or phenotypically aberrant T-cell population. Special stains for microorganisms including AFB, GMS, PAS and Warthin-Starry, are negative.  CT chest 05/30/2017- multiple enlarged right axillary lymph nodes. Largest right axillary lymph node measures 2.6 x 2.2 cm. Single enlarged left axillary lymph node measuring 16 mm.  CT neck 06/03/2017-known bilateral axillary lymphadenopathy partially covered on the scan. No lymphadenopathy seen in the neck.  Biopsy right axillary lymph node 06/22/2017(Dr. Tsuei)- fibroadipose tissue with necrosis, chronic inflammation and granulation tissue  PET scan 07/19/2017-hypermetabolic bilateral axillary lymph nodes. Hypermetabolic but small right supraclavicular lymph nodes. In the right omentum there is a small focus of rim density and internal fat density which is hypermetabolic.  CT neck 10/25/2017- progressedbilateral subclavian lymphadenopathy since February, right greater than left. Conspicuous enlargement of the small lymph nodes at the right level 2 and 3 nodal stations.  CT chest/abdomen/pelvis 10/25/2017-progressive adenopathy identified within the left axilla, bilateral supraclavicular stations and left inguinal region. Persistent but improved right axillary adenopathy. Scattered indeterminate low-attenuation foci within the spleen not seen on study from 05/30/2017.  Excisional biopsy of right supraclavicular and posterior triangle lymph nodes on7/25/2019-diffuse large B-cell lymphoma, CD20 positive  PET scan [id number] hypermetabolic lymph nodes and progressive hypermetabolism in existing lymph nodes. Hypermetabolic focus along the anterior aspect of the right scapula with adjacent soft tissue mass. No new adenopathy involving the mediastinal or hilar regions of the chest or the abdomen or pelvis. No obvious involvement of  the liver or  spleen.  IPI score 3  Cycle 1 CHOP/Rituxan 12/04/2017  Cycle 2 CHOP/Rituxan 12/27/2017  Cycle 3 CHOP/Rituxan 01/16/2018  Cycle 4 CHOP/Rituxan 02/05/2018  PET 02/19/2018- resolution of previously noted hypermetabolic adenopathy, no residual hypermetabolic osseous lesions  Cycle 5 CHOP/rituximab 02/27/2018  Cycle 6 CHOP/rituximab 03/20/2018 2. Hypertension  3. Hypothyroid 4. Hypercholesterolemia 5. Family history significant for colon cancer, stomach cancer and brain cancer 6. Ongoing tobacco use 7. Port-A-Cath placement 12/01/2017, Interventional Radiology 8. Left arm edema-negative venous Doppler 02/26/2018    Disposition: Kendra Ball is in remission from non-Hodgkin's lymphoma.  She will return for an office visit in 6 months.  She will call in the interim for new symptoms.  She will call for numbness or weakness in the upper extremities.  Betsy Coder, MD  08/19/2019  12:14 PM

## 2019-08-20 ENCOUNTER — Telehealth: Payer: Self-pay | Admitting: Oncology

## 2019-08-20 NOTE — Telephone Encounter (Signed)
Scheduled per los. Called and spoke with patient. Confirmed appt 

## 2019-08-21 ENCOUNTER — Telehealth: Payer: Self-pay | Admitting: *Deleted

## 2019-08-21 NOTE — Telephone Encounter (Signed)
Per Dr. Benay Spice: She may qualify for low dose CT screening for lung cancer based on number of years smoking and that she still smokes. Patient agrees to proceed. Notified thoracic nurse navigator. Was informed she must be out 5 years from last chemotherapy, thus does not qualify. Patient and Dr. Benay Spice notified.

## 2019-08-29 ENCOUNTER — Telehealth (HOSPITAL_COMMUNITY): Payer: Medicare PPO | Admitting: Psychiatry

## 2019-08-29 ENCOUNTER — Other Ambulatory Visit: Payer: Self-pay

## 2019-08-29 ENCOUNTER — Encounter (HOSPITAL_COMMUNITY): Payer: Self-pay | Admitting: Psychiatry

## 2019-08-29 DIAGNOSIS — F5105 Insomnia due to other mental disorder: Secondary | ICD-10-CM

## 2019-08-29 DIAGNOSIS — F17200 Nicotine dependence, unspecified, uncomplicated: Secondary | ICD-10-CM

## 2019-08-29 DIAGNOSIS — F1020 Alcohol dependence, uncomplicated: Secondary | ICD-10-CM

## 2019-08-29 DIAGNOSIS — F411 Generalized anxiety disorder: Secondary | ICD-10-CM

## 2019-08-29 DIAGNOSIS — F99 Mental disorder, not otherwise specified: Secondary | ICD-10-CM

## 2019-08-29 DIAGNOSIS — F331 Major depressive disorder, recurrent, moderate: Secondary | ICD-10-CM

## 2019-08-29 MED ORDER — BUSPIRONE HCL 10 MG PO TABS: 10.0000 mg | ORAL_TABLET | Freq: Three times a day (TID) | ORAL | 0 refills | Status: DC

## 2019-08-29 MED ORDER — TRAZODONE HCL 100 MG PO TABS: 200.0000 mg | ORAL_TABLET | Freq: Every day | ORAL | 0 refills | Status: DC

## 2019-08-29 MED ORDER — BUPROPION HCL ER (SR) 150 MG PO TB12: 150.0000 mg | ORAL_TABLET | Freq: Two times a day (BID) | ORAL | 0 refills | Status: DC

## 2019-08-29 MED ORDER — VENLAFAXINE HCL ER 150 MG PO CP24: 150.0000 mg | ORAL_CAPSULE | Freq: Every day | ORAL | 0 refills | Status: DC

## 2019-08-29 NOTE — Progress Notes (Unsigned)
Virtual Visit via Telephone Note  I connected with Kendra Ball on 08/29/19 at 10:30 AM EDT by telephone and verified that I am speaking with the correct person using two identifiers.  Location: Patient: home Provider: office   I discussed the limitations, risks, security and privacy concerns of performing an evaluation and management service by telephone and the availability of in person appointments. I also discussed with the patient that there may be a patient responsible charge related to this service. The patient expressed understanding and agreed to proceed.   History of Present Illness: Kendra Ball reports she has been visiting family. She is concerned that her level of excitement for social and other activities is not as much as before. She thinks it is due to the increase in Effexor. Kendra Ball is not depressed. She denies anhedonia. She denies SI/HI. Her anxiety seems better. She does have some episodes of internal restlessness. Sleep is good with Trazodone. Kendra Ball denies any alcohol use since our last visit.    Observations/Objective:  General Appearance: unable to assess  Eye Contact:  unable to assess  Speech:  {Speech:22685}  Volume:  {Volume (PAA):22686}  Mood:  {BHH MOOD:22306}  Affect:  {Affect (PAA):22687}  Thought Process:  {Thought Process (PAA):22688}  Orientation:  {BHH ORIENTATION (PAA):22689}  Thought Content:  {Thought Content:22690}  Suicidal Thoughts:  {ST/HT (PAA):22692}  Homicidal Thoughts:  {ST/HT (PAA):22692}  Memory:  {BHH MEMORY:22881}  Judgement:  {Judgement (PAA):22694}  Insight:  {Insight (PAA):22695}  Psychomotor Activity: unable to assess  Concentration:  {Concentration:21399}  Recall:  {BHH GOOD/FAIR/POOR:22877}  Fund of Knowledge:  {BHH GOOD/FAIR/POOR:22877}  Language:  {BHH GOOD/FAIR/POOR:22877}  Akathisia:  unable to assess  Handed:  {Handed:22697}  AIMS (if indicated):     Assets:  {Assets (PAA):22698}  ADL's:  unable to assess   Cognition:  {chl bhh cognition:304700322}  Sleep:         Assessment and Plan: MDD-recurrent, moderate; GAD; Insomnia; NIcoine use disorder; Alcohol use disorder in remission.   Decrease Effexor XR 150mg  po qD- due to possible muting effect on mood  Wellbutrin XR 150mg  po BID  Trazodone 200mg  po qHS  Buspar 10mg  po TID   Follow Up Instructions: In 2 months or sooner if needed   I discussed the assessment and treatment plan with the patient. The patient was provided an opportunity to ask questions and all were answered. The patient agreed with the plan and demonstrated an understanding of the instructions.   The patient was advised to call back or seek an in-person evaluation if the symptoms worsen or if the condition fails to improve as anticipated.  I provided 20 minutes of non-face-to-face time during this encounter.   [name], MD

## 2019-09-03 ENCOUNTER — Ambulatory Visit
Admission: RE | Admit: 2019-09-03 | Discharge: 2019-09-03 | Disposition: A | Discharge status: Home / Self Care | Payer: Medicare PPO | Source: Ambulatory Visit | Attending: Neurology | Admitting: Neurology

## 2019-09-03 ENCOUNTER — Other Ambulatory Visit: Payer: Self-pay

## 2019-09-03 DIAGNOSIS — R269 Unspecified abnormalities of gait and mobility: Secondary | ICD-10-CM

## 2019-09-03 DIAGNOSIS — R202 Paresthesia of skin: Secondary | ICD-10-CM | POA: Diagnosis not present

## 2019-09-05 ENCOUNTER — Telehealth: Payer: Self-pay | Admitting: Neurology

## 2019-09-05 NOTE — Telephone Encounter (Signed)
Attempted to reach the pt. She was unavailable and vm was full. Will try again at a later time.

## 2019-09-05 NOTE — Telephone Encounter (Signed)
  IMPRESSION:   MRI cervical spine without contrast demonstrating: - Degenerative spondylosis, disc bulging and facet hypertrophy mainly at C5-6 and C6-7 with moderate bilateral foraminal stenosis.   - Additional multilevel degenerative changes and foraminal stenosis as noted above.  Please call patient, MRI of cervical spine showed multilevel degenerative changes, there was no evidence of significant canal or foraminal narrowing.

## 2019-09-11 NOTE — Telephone Encounter (Signed)
I reached out to the pt and advised of results. She verbalized understanding and had no questions/concerns.

## 2019-10-24 ENCOUNTER — Other Ambulatory Visit: Payer: Self-pay

## 2019-10-24 ENCOUNTER — Telehealth (INDEPENDENT_AMBULATORY_CARE_PROVIDER_SITE_OTHER): Payer: Medicare PPO | Admitting: Psychiatry

## 2019-10-24 ENCOUNTER — Encounter (HOSPITAL_COMMUNITY): Payer: Self-pay | Admitting: Psychiatry

## 2019-10-24 DIAGNOSIS — F1020 Alcohol dependence, uncomplicated: Secondary | ICD-10-CM | POA: Diagnosis not present

## 2019-10-24 DIAGNOSIS — F411 Generalized anxiety disorder: Secondary | ICD-10-CM

## 2019-10-24 DIAGNOSIS — F17200 Nicotine dependence, unspecified, uncomplicated: Secondary | ICD-10-CM | POA: Diagnosis not present

## 2019-10-24 DIAGNOSIS — F331 Major depressive disorder, recurrent, moderate: Secondary | ICD-10-CM | POA: Diagnosis not present

## 2019-10-24 DIAGNOSIS — F99 Mental disorder, not otherwise specified: Secondary | ICD-10-CM | POA: Diagnosis not present

## 2019-10-24 DIAGNOSIS — F5105 Insomnia due to other mental disorder: Secondary | ICD-10-CM

## 2019-10-24 MED ORDER — BUSPIRONE HCL 10 MG PO TABS: 10.0000 mg | ORAL_TABLET | Freq: Three times a day (TID) | ORAL | 0 refills | Status: DC

## 2019-10-24 MED ORDER — BUPROPION HCL ER (SR) 150 MG PO TB12: 150.0000 mg | ORAL_TABLET | Freq: Two times a day (BID) | ORAL | 0 refills | Status: DC

## 2019-10-24 MED ORDER — TRAZODONE HCL 100 MG PO TABS: 200.0000 mg | ORAL_TABLET | Freq: Every day | ORAL | 0 refills | Status: DC

## 2019-10-24 MED ORDER — VENLAFAXINE HCL ER 150 MG PO CP24: 150.0000 mg | ORAL_CAPSULE | Freq: Every day | ORAL | 0 refills | Status: DC

## 2019-10-24 NOTE — Progress Notes (Signed)
Virtual Visit via Telephone Note  I connected with Kendra Ball on 10/24/19 at 10:30 AM EDT by telephone and verified that I am speaking with the correct person using two identifiers.  Location: Patient: home Provider: office   I discussed the limitations, risks, security and privacy concerns of performing an evaluation and management service by telephone and the availability of in person appointments. I also discussed with the patient that there may be a patient responsible charge related to this service. The patient expressed understanding and agreed to proceed.   History of Present Illness: "I am really doing good- emotionally and physically. Some days I feel better than I have in years". She is walking with her daughter at least twice a week. She denies depression. She has some down days. She denies anhedonia. Sleep is good and Trazodone is effective. Anxiety is mild and the Buspar helps. She also uses coping skills. She denies SI/HI.    Observations/Objective:  General Appearance: unable to assess  Eye Contact:  unable to assess  Speech:  Clear and Coherent and Normal Rate  Volume:  Normal  Mood:  Euthymic  Affect:  Full Range  Thought Process:  Goal Directed, Linear and Descriptions of Associations: Intact  Orientation:  Full (Time, Place, and Person)  Thought Content:  Logical  Suicidal Thoughts:  No  Homicidal Thoughts:  No  Memory:  Immediate;   Good  Judgement:  Good  Insight:  Good  Psychomotor Activity: unable to assess  Concentration:  Concentration: Good  Recall:  Good  Fund of Knowledge:  Good  Language:  Good  Akathisia:  unable to assess  Handed:  Right  AIMS (if indicated):     Assets:  Communication Skills Desire for Improvement Financial Resources/Insurance Housing Leisure Time Resilience Social Support Talents/Skills Transportation Vocational/Educational  ADL's:  unable to assess  Cognition:  WNL  Sleep:        I reviewed the  information below on 10/24/2019 and have updated it Assessment and Plan:  MDD-recurrent, moderate; GAD; Insomnia; NIcoine use disorder; Alcohol use disorder in remission.    Decrease Effexor XR 150mg  po qD- due to possible muting effect on mood   Wellbutrin XR 150mg  po BID   Trazodone 200mg  po qHS   Buspar 10mg  po TID   Follow Up Instructions: In 3-4 months or sooner if  needed   I discussed the assessment and treatment plan with the patient. The patient was provided an opportunity to ask questions and all were answered. The patient agreed with the plan and demonstrated an understanding of the instructions.   The patient was advised to call back or seek an in-person evaluation if the symptoms worsen or if the condition fails to improve as anticipated.  I provided 15 minutes of non-face-to-face time during this encounter.   [name], MD

## 2020-02-02 ENCOUNTER — Other Ambulatory Visit (HOSPITAL_COMMUNITY): Payer: Self-pay | Admitting: Psychiatry

## 2020-02-02 DIAGNOSIS — F331 Major depressive disorder, recurrent, moderate: Secondary | ICD-10-CM

## 2020-02-02 DIAGNOSIS — F1020 Alcohol dependence, uncomplicated: Secondary | ICD-10-CM

## 2020-02-02 DIAGNOSIS — F411 Generalized anxiety disorder: Secondary | ICD-10-CM

## 2020-02-12 ENCOUNTER — Other Ambulatory Visit: Payer: Self-pay

## 2020-02-12 DIAGNOSIS — E785 Hyperlipidemia, unspecified: Secondary | ICD-10-CM

## 2020-02-12 MED ORDER — SIMVASTATIN 10 MG PO TABS: 10.0000 mg | ORAL_TABLET | Freq: Every day | ORAL | 0 refills | Status: DC

## 2020-02-18 ENCOUNTER — Inpatient Hospital Stay: Payer: Medicare PPO | Attending: Oncology | Admitting: Oncology

## 2020-02-18 ENCOUNTER — Telehealth: Payer: Self-pay | Admitting: Oncology

## 2020-02-18 NOTE — Telephone Encounter (Signed)
Rescheduled appointment per 10/22 sch msg. Spoke to patient who is aware of appointment date and time.

## 2020-02-20 ENCOUNTER — Other Ambulatory Visit: Payer: Self-pay

## 2020-02-20 ENCOUNTER — Telehealth (INDEPENDENT_AMBULATORY_CARE_PROVIDER_SITE_OTHER): Payer: Medicare PPO | Admitting: Psychiatry

## 2020-02-20 DIAGNOSIS — F5105 Insomnia due to other mental disorder: Secondary | ICD-10-CM | POA: Diagnosis not present

## 2020-02-20 DIAGNOSIS — F99 Mental disorder, not otherwise specified: Secondary | ICD-10-CM

## 2020-02-20 DIAGNOSIS — F1020 Alcohol dependence, uncomplicated: Secondary | ICD-10-CM

## 2020-02-20 DIAGNOSIS — F411 Generalized anxiety disorder: Secondary | ICD-10-CM

## 2020-02-20 DIAGNOSIS — F17200 Nicotine dependence, unspecified, uncomplicated: Secondary | ICD-10-CM | POA: Diagnosis not present

## 2020-02-20 DIAGNOSIS — F331 Major depressive disorder, recurrent, moderate: Secondary | ICD-10-CM

## 2020-02-20 MED ORDER — BUPROPION HCL ER (SR) 150 MG PO TB12: 150.0000 mg | ORAL_TABLET | Freq: Two times a day (BID) | ORAL | 0 refills | Status: DC

## 2020-02-20 MED ORDER — VENLAFAXINE HCL ER 150 MG PO CP24: 150.0000 mg | ORAL_CAPSULE | Freq: Every day | ORAL | 0 refills | Status: DC

## 2020-02-20 MED ORDER — TRAZODONE HCL 100 MG PO TABS: 200.0000 mg | ORAL_TABLET | Freq: Every day | ORAL | 0 refills | Status: DC

## 2020-02-20 MED ORDER — BUSPIRONE HCL 10 MG PO TABS: 10.0000 mg | ORAL_TABLET | Freq: Three times a day (TID) | ORAL | 0 refills | Status: DC

## 2020-02-20 NOTE — Progress Notes (Signed)
Virtual Visit via Telephone Note  I connected with Kendra Ball on 02/20/20 at 11:30 AM EDT by telephone and verified that I am speaking with the correct person using two identifiers.  Location: Patient: home Provider: office   I discussed the limitations, risks, security and privacy concerns of performing an evaluation and management service by telephone and the availability of in person appointments. I also discussed with the patient that there may be a patient responsible charge related to this service. The patient expressed understanding and agreed to proceed.   History of Present Illness: Overall Kendra Ball feels she is doing well. She is getting out a little more since she has been COVID vaccinated. Kendra Ball goes walking 2-3 mornings a week. She is looking forward to the holidays this year. She is feeling satisfied with her life. Her low energy sometimes makes her feel down. Kendra Ball attributes this to normal energy. The days she does feel down she will spend her time watching tv. Years ago when she was depressed she would spend all her time her bed. This is a significant improvement from years ago. Her meds are helping and do not cause her feel numb. Her sleep is good with Trazodone. Anxiety is well managed with Buspar. She denies any SI/HI. Kendra Ball denies alcohol use. Sometimes when anxious she has fleeting thoughts of drinking but remembers its effects and does not act on it.    Observations/Objective:  General Appearance: unable to assess  Eye Contact:  unable to assess  Speech:  Clear and Coherent and Normal Rate  Volume:  Normal  Mood:  Euthymic  Affect:  Full Range  Thought Process:  Goal Directed, Linear and Descriptions of Associations: Intact  Orientation:  Full (Time, Place, and Person)  Thought Content:  Logical  Suicidal Thoughts:  No  Homicidal Thoughts:  No  Memory:  Immediate;   Good  Judgement:  Good  Insight:  Good  Psychomotor Activity: unable to assess   Concentration:  Concentration: Good  Recall:  Good  Fund of Knowledge:  Good  Language:  Good  Akathisia:  unable to assess  Handed:  Right  AIMS (if indicated):     Assets:  Communication Skills Desire for Improvement Financial Resources/Insurance Housing Leisure Time Physical Health Resilience Social Support Talents/Skills Transportation Vocational/Educational  ADL's:  unable to assess  Cognition:  WNL  Sleep:         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, sustained remission  Effexor XR 150mg  po qD- higher doses caused muted emotional effects  Wellbutrin SR 150mg  po BID  Trazodone 200mg  po qHS  Buspar 10mg  po TID   Follow Up Instructions: In 3 months or sooner if needed   I discussed the assessment and treatment plan with the patient. The patient was provided an opportunity to ask questions and all were answered. The patient agreed with the plan and demonstrated an understanding of the instructions.   The patient was advised to call back or seek an in-person evaluation if the symptoms worsen or if the condition fails to improve as anticipated.  I provided 11 minutes of non-face-to-face time during this encounter.   [name], MD

## 2020-03-06 ENCOUNTER — Other Ambulatory Visit: Payer: Self-pay

## 2020-03-06 ENCOUNTER — Inpatient Hospital Stay: Payer: Medicare PPO | Attending: Oncology | Admitting: Oncology

## 2020-03-06 VITALS — BP 121/66 | HR 75 | Temp 98.8°F | Resp 16 | Ht 64.0 in | Wt 160.1 lb

## 2020-03-06 DIAGNOSIS — Z800 Family history of malignant neoplasm of digestive organs: Secondary | ICD-10-CM | POA: Insufficient documentation

## 2020-03-06 DIAGNOSIS — Z79899 Other long term (current) drug therapy: Secondary | ICD-10-CM | POA: Insufficient documentation

## 2020-03-06 DIAGNOSIS — I10 Essential (primary) hypertension: Secondary | ICD-10-CM | POA: Diagnosis not present

## 2020-03-06 DIAGNOSIS — C8338 Diffuse large B-cell lymphoma, lymph nodes of multiple sites: Secondary | ICD-10-CM | POA: Insufficient documentation

## 2020-03-06 DIAGNOSIS — Z808 Family history of malignant neoplasm of other organs or systems: Secondary | ICD-10-CM | POA: Insufficient documentation

## 2020-03-06 DIAGNOSIS — C8580 Other specified types of non-Hodgkin lymphoma, unspecified site: Secondary | ICD-10-CM

## 2020-03-06 DIAGNOSIS — E039 Hypothyroidism, unspecified: Secondary | ICD-10-CM | POA: Diagnosis not present

## 2020-03-06 DIAGNOSIS — E7800 Pure hypercholesterolemia, unspecified: Secondary | ICD-10-CM | POA: Insufficient documentation

## 2020-03-06 NOTE — Progress Notes (Signed)
[facility] OFFICE PROGRESS NOTE   Diagnosis: Non-Hodgkin's lymphoma  INTERVAL HISTORY:   Kendra Ball returns as scheduled.  She feels well.  No complaint.  Good appetite.  No palpable lymph nodes.  She has received the COVID-19 and influenza vaccines.  She continues smoking.  Objective:  Vital signs in last 24 hours:  Blood pressure 121/66, pulse 75, temperature 98.8 F (37.1 C), temperature source Tympanic, resp. rate 16, height 5\' 4"  (1.626 m), weight 160 lb 1.6 oz (72.6 kg), SpO2 97 %.    HEENT: Neck without mass Lymphatics: No cervical, supraclavicular, axillary, or inguinal nodes Resp: Lungs clear bilaterally, distant breath sounds Cardio: Regular rate and rhythm GI: No hepatosplenomegaly, no mass, nontender Vascular: No leg edema   Lab Results:  Lab Results  Component Value Date   WBC 6.2 05/22/2019   HGB 14.2 05/22/2019   HCT 42.9 05/22/2019   MCV 98.9 05/22/2019   PLT 244.0 05/22/2019   NEUTROABS 4.7 05/22/2019    CMP  Lab Results  Component Value Date   NA 141 05/22/2019   K 4.4 05/22/2019   CL 105 05/22/2019   CO2 26 05/22/2019   GLUCOSE 92 05/22/2019   BUN 11 05/22/2019   CREATININE 0.69 05/22/2019   CALCIUM 9.1 05/22/2019   PROT 6.2 08/06/2019   ALBUMIN 4.1 01/29/2019   AST 19 05/22/2019   ALT 17 05/22/2019   ALKPHOS 66 01/29/2019   BILITOT 0.5 05/22/2019   GFRNONAA >60 07/03/2018   GFRAA >60 07/03/2018     Medications: I have reviewed the patient's current medications.   Assessment/Plan:  1. Non-Hodgkin's lymphoma, diffuse large B-cell lymphoma, CD20 positive  Ultrasound of the neck 02/18/2017-right supraclavicular adenopathy with at least 3 nodes, largest measuring 1.3 x 1.1 x 1.4 cm; 2 adjacent hypoechoic right occipital lymph nodes  Excisional biopsy right supraclavicular lymphadenopathy 74/04/2876 (Dr. Deetta Perla lymphoid proliferation. Necrotic soft tissue with associated acute and chronic  inflammation.By flow cytometry no monoclonal B-cell or phenotypically aberrant T-cell population. Special stains for microorganisms including AFB, GMS, PAS and Warthin-Starry, are negative.  CT chest 05/30/2017- multiple enlarged right axillary lymph nodes. Largest right axillary lymph node measures 2.6 x 2.2 cm. Single enlarged left axillary lymph node measuring 16 mm.  CT neck 06/03/2017-known bilateral axillary lymphadenopathy partially covered on the scan. No lymphadenopathy seen in the neck.  Biopsy right axillary lymph node 06/22/2017(Dr. Tsuei)- fibroadipose tissue with necrosis, chronic inflammation and granulation tissue  PET scan 07/19/2017-hypermetabolic bilateral axillary lymph nodes. Hypermetabolic but small right supraclavicular lymph nodes. In the right omentum there is a small focus of rim density and internal fat density which is hypermetabolic.  CT neck 10/25/2017- progressedbilateral subclavian lymphadenopathy since February, right greater than left. Conspicuous enlargement of the small lymph nodes at the right level 2 and 3 nodal stations.  CT chest/abdomen/pelvis 10/25/2017-progressive adenopathy identified within the left axilla, bilateral supraclavicular stations and left inguinal region. Persistent but improved right axillary adenopathy. Scattered indeterminate low-attenuation foci within the spleen not seen on study from 05/30/2017.  Excisional biopsy of right supraclavicular and posterior triangle lymph nodes on7/25/2019-diffuse large B-cell lymphoma, CD20 positive  PET scan 09/30/6718-NOB hypermetabolic lymph nodes and progressive hypermetabolism in existing lymph nodes. Hypermetabolic focus along the anterior aspect of the right scapula with adjacent soft tissue mass. No new adenopathy involving the mediastinal or hilar regions of the chest or the abdomen or pelvis. No obvious involvement of the liver or spleen.  IPI score 3  Cycle 1 CHOP/Rituxan  12/04/2017  Cycle 2 CHOP/Rituxan 12/27/2017  Cycle 3 CHOP/Rituxan 01/16/2018  Cycle 4 CHOP/Rituxan 02/05/2018  PET 02/19/2018- resolution of previously noted hypermetabolic adenopathy, no residual hypermetabolic osseous lesions  Cycle 5 CHOP/rituximab 02/27/2018  Cycle 6 CHOP/rituximab 03/20/2018 2. Hypertension  3. Hypothyroid 4. Hypercholesterolemia 5. Family history significant for colon cancer, stomach cancer and brain cancer 6. Ongoing tobacco use 7. Port-A-Cath placement 12/01/2017, Interventional Radiology 8. Left arm edema-negative venous Doppler 02/26/2018     Disposition: Kendra Ball is in clinical remission from [place].  She will return for an office visit in 6 months.  She continues smoking.  I encouraged her to discontinue smoking.  I referred her for a lung cancer screening chest CT.  Betsy Coder, MD  03/06/2020  11:53 AM

## 2020-03-09 ENCOUNTER — Other Ambulatory Visit: Payer: Self-pay | Admitting: Family Medicine

## 2020-03-09 ENCOUNTER — Telehealth: Payer: Self-pay | Admitting: Oncology

## 2020-03-09 DIAGNOSIS — E785 Hyperlipidemia, unspecified: Secondary | ICD-10-CM

## 2020-03-09 NOTE — Telephone Encounter (Signed)
Scheduled appointment per 11/12 los. Spoke to patient who is aware of appointment date and time.

## 2020-03-24 ENCOUNTER — Ambulatory Visit (INDEPENDENT_AMBULATORY_CARE_PROVIDER_SITE_OTHER): Payer: Medicare PPO | Admitting: Family Medicine

## 2020-03-24 ENCOUNTER — Encounter: Payer: Self-pay | Admitting: Family Medicine

## 2020-03-24 ENCOUNTER — Other Ambulatory Visit: Payer: Self-pay

## 2020-03-24 VITALS — BP 122/63 | HR 62 | Temp 98.0°F | Ht 64.0 in | Wt 161.0 lb

## 2020-03-24 DIAGNOSIS — F331 Major depressive disorder, recurrent, moderate: Secondary | ICD-10-CM | POA: Diagnosis not present

## 2020-03-24 DIAGNOSIS — F411 Generalized anxiety disorder: Secondary | ICD-10-CM | POA: Diagnosis not present

## 2020-03-24 DIAGNOSIS — M810 Age-related osteoporosis without current pathological fracture: Secondary | ICD-10-CM | POA: Diagnosis not present

## 2020-03-24 DIAGNOSIS — C8330 Diffuse large B-cell lymphoma, unspecified site: Secondary | ICD-10-CM | POA: Diagnosis not present

## 2020-03-24 DIAGNOSIS — I2510 Atherosclerotic heart disease of native coronary artery without angina pectoris: Secondary | ICD-10-CM | POA: Diagnosis not present

## 2020-03-24 DIAGNOSIS — F17200 Nicotine dependence, unspecified, uncomplicated: Secondary | ICD-10-CM

## 2020-03-24 DIAGNOSIS — Z8249 Family history of ischemic heart disease and other diseases of the circulatory system: Secondary | ICD-10-CM

## 2020-03-24 DIAGNOSIS — E039 Hypothyroidism, unspecified: Secondary | ICD-10-CM | POA: Diagnosis not present

## 2020-03-24 DIAGNOSIS — E611 Iron deficiency: Secondary | ICD-10-CM

## 2020-03-24 DIAGNOSIS — E538 Deficiency of other specified B group vitamins: Secondary | ICD-10-CM

## 2020-03-24 DIAGNOSIS — E785 Hyperlipidemia, unspecified: Secondary | ICD-10-CM | POA: Diagnosis not present

## 2020-03-24 DIAGNOSIS — J449 Chronic obstructive pulmonary disease, unspecified: Secondary | ICD-10-CM

## 2020-03-24 LAB — VITAMIN B12: Vitamin B-12: 176 pg/mL — ABNORMAL LOW (ref 211–911)

## 2020-03-24 LAB — LIPID PANEL
Cholesterol: 168 mg/dL (ref 0–200)
HDL: 89.9 mg/dL (ref >39.00)
LDL Cholesterol: 61 mg/dL (ref 0–99)
NonHDL: 77.9
Total CHOL/HDL Ratio: 2
Triglycerides: 86 mg/dL (ref 0.0–149.0)
VLDL: 17.2 mg/dL (ref 0.0–40.0)

## 2020-03-24 LAB — COMPREHENSIVE METABOLIC PANEL
ALT: 13 U/L (ref 0–35)
AST: 18 U/L (ref 0–37)
Albumin: 4.1 g/dL (ref 3.5–5.2)
Alkaline Phosphatase: 54 U/L (ref 39–117)
BUN: 8 mg/dL (ref 6–23)
CO2: 33 mEq/L — ABNORMAL HIGH (ref 19–32)
Calcium: 9.2 mg/dL (ref 8.4–10.5)
Chloride: 104 mEq/L (ref 96–112)
Creatinine, Ser: 0.73 mg/dL (ref 0.40–1.20)
GFR: 81.35 mL/min (ref >60.00)
Glucose, Bld: 80 mg/dL (ref 70–99)
Potassium: 4.6 mEq/L (ref 3.5–5.1)
Sodium: 141 mEq/L (ref 135–145)
Total Bilirubin: 0.6 mg/dL (ref 0.2–1.2)
Total Protein: 6.4 g/dL (ref 6.0–8.3)

## 2020-03-24 LAB — TSH: TSH: 1.31 u[IU]/mL (ref 0.35–4.50)

## 2020-03-24 LAB — CBC
HCT: 44.1 % (ref 36.0–46.0)
Hemoglobin: 14.8 g/dL (ref 12.0–15.0)
MCHC: 33.6 g/dL (ref 30.0–36.0)
MCV: 98.6 fl (ref 78.0–100.0)
Platelets: 236 10*3/uL (ref 150.0–400.0)
RBC: 4.47 Mil/uL (ref 3.87–5.11)
RDW: 13 % (ref 11.5–15.5)
WBC: 5.6 10*3/uL (ref 4.0–10.5)

## 2020-03-24 LAB — VITAMIN D 25 HYDROXY (VIT D DEFICIENCY, FRACTURES): VITD: 48.56 ng/mL (ref 30.00–100.00)

## 2020-03-24 MED ORDER — BUDESONIDE-FORMOTEROL FUMARATE 160-4.5 MCG/ACT IN AERO: 2.0000 | INHALATION_SPRAY | Freq: Two times a day (BID) | RESPIRATORY_TRACT | 5 refills | Status: DC

## 2020-03-24 MED ORDER — ALBUTEROL SULFATE HFA 108 (90 BASE) MCG/ACT IN AERS: 2.0000 | INHALATION_SPRAY | Freq: Four times a day (QID) | RESPIRATORY_TRACT | 2 refills | Status: DC | PRN

## 2020-03-24 MED ORDER — ALENDRONATE SODIUM 70 MG PO TABS
70.0000 mg | ORAL_TABLET | ORAL | 11 refills | Status: DC
Start: 2020-03-24 — End: 2021-03-31

## 2020-03-24 MED ORDER — SIMVASTATIN 10 MG PO TABS: 10.0000 mg | ORAL_TABLET | Freq: Every day | ORAL | 3 refills | Status: DC

## 2020-03-24 MED ORDER — TETANUS-DIPHTH-ACELL PERTUSSIS 5-2.5-18.5 LF-MCG/0.5 IM SUSP: 0.5000 mL | Freq: Once | INTRAMUSCULAR | 0 refills | Status: AC

## 2020-03-24 NOTE — Progress Notes (Signed)
Kendra Ball , 12-Oct-1945, 74 y.o., female MRN: [id number] Patient Care Team    Relationship Specialty Notifications Start End  Ma Hillock, DO PCP - General Family Medicine  12/30/14   Juanita Craver, MD Consulting Physician Gastroenterology  05/09/14   Excell Seltzer, MD (Inactive) Consulting Physician General Surgery  05/09/14   Brandon Melnick, [place]  Psychology  10/16/15   [name], MD  Psychiatry  12/12/16     Chief Complaint  Patient presents with  . Follow-up    CMC; pt is not fasting      Subjective: Kendra Ball is a 74 y.o. female present for cmc follow up hyperlipidemia: Patient has been removed from her lisinopril 2/2 to  Low BP .  Blood pressures have remained stable.  She is compliant with simvastatin 10 mg daily without side effects.  Hypothyroidism, unspecified type She reports compliance with levothyroxine 100 mcg daily.  She reports she is feeling well.  COPD (chronic obstructive pulmonary disease) with chronic bronchitis ([place]) She reports compliance with Symbicort 2 puffs twice daily and albuterol inhaler when needed.  She denies cough or shortness of breath today..  She is still smoking.  Diffuse large B-cell lymphoma, unspecified body region [facility]) She is established with oncology and had her last chemo treatment 02/2018.  She is following closely with oncology and doing well.  Osteopenia/mulitple fractures/falls: Last dexa 05/15/2019 with osteoporosis with a T score of -3.1.  She is tolerating Fosamax.  Depression screen [facility] 2/9 03/24/2020 04/10/2018 02/16/2017 12/12/2016 10/16/2015  Decreased Interest 0 0 0 2 0  Down, Depressed, Hopeless 0 0 0 0 0  PHQ - 2 Score 0 0 0 2 0  Altered sleeping 0 0 - 0 -  Tired, decreased energy 0 2 - 3 -  Change in appetite 0 2 - 0 -  Feeling bad or failure about yourself  0 0 - 1 -  Trouble concentrating 0 2 - 0 -  Moving slowly or fidgety/restless 0 0 - 0 -  Suicidal thoughts 0 - - 0 -  PHQ-9 Score 0 6 - 6 -   Difficult doing work/chores - Not difficult at all - Not difficult at all -  Some encounter information is confidential and restricted. Go to Review Flowsheets activity to see all data.  Some recent data might be hidden    Allergies  Allergen Reactions  . Morphine Sulfate Swelling and Other (See Comments)    Swelling around injection area   Social History   Tobacco Use  . Smoking status: Current Every Day Smoker    Packs/day: 1.00    Years: 50.00    Pack years: 50.00    Types: Cigarettes  . Smokeless tobacco: Never Used  Substance Use Topics  . Alcohol use: No    Comment: quit on Aug 27, 2015   Past Medical History:  Diagnosis Date  . Adenopathy    right supraclavicular  . Anxiety   . Arthritis   . Chronic diarrhea 10/13/2015  . COPD (chronic obstructive pulmonary disease) ([employer])   . DEPRESSION 08/26/2008  . Headache   . Hx of colonic polyps    First noted on  colonoscopy 2012  . Hypercholesteremia   . HYPERTENSION 08/26/2008  . HYPOTHYROIDISM 08/26/2008  . Lymphoma ([place]) 2019   B-cell  . Obesity   . Osteoporosis   . Substance abuse [facility])    inpatient and outpatient tx for substance abuse  . TOBACCO USER 12/25/2009   Past  Surgical History:  Procedure Laterality Date  . AXILLARY LYMPH NODE BIOPSY Right 06/22/2017   Procedure: RIGHT AXILLARY LYMPH NODE BIOPSY;  Surgeon: [name], MD;  Location: [facility];  Service: General;  Laterality: Right;  . [place] SURGERY  2006  . CESAREAN SECTION    . COLON RESECTION N/A 05/09/2014   Procedure: LAPAROSCOPIC HAND-ASSISTED EXTENDED RIGHT COLECTOMY, LAPAROSCOPIC LYSIS OF ADHESIONS, SPLENIC FLEXURE MOBILIZATION.;  Surgeon: [name], MD;  Location: [facility];  Service: General;  Laterality: N/A;  . DILATION AND CURETTAGE OF UTERUS     x2  . FOREARM FRACTURE SURGERY     right  . GASTRIC BYPASS  2006  . IR IMAGING GUIDED PORT INSERTION  12/01/2017  . IR REMOVAL TUN ACCESS W/ PORT W/O FL MOD SED  07/03/2018  . MASS EXCISION Right  02/24/2017   Procedure: EXCISIONAL BIOPSY RIGHT SUPRA-CLAVICULAR NODE;  Surgeon: [name], MD;  Location: [facility];  Service: ENT;  Laterality: Right;  . TONSILLECTOMY     Family History  Problem Relation Age of Onset  . Cancer Mother        colon  . Depression Mother   . Alzheimer's disease Mother        Dementia  . Heart disease Father 35  . Heart disease Sister 51       stents, pacemaker  . Depression Sister   . Heart disease Brother 1       CABG, MI  . Depression Brother   . Obesity Daughter   . Suicidality Daughter   . Bipolar disorder Daughter   . Obesity Son   . Mental illness Daughter        bipolar  . Obesity Daughter   . Depression Sister   . Dementia Paternal Aunt   . Alcohol abuse Maternal Uncle   . Colon cancer Maternal Aunt   . Stomach cancer Maternal Grandfather    Allergies as of 03/24/2020      Reactions   Morphine Sulfate Swelling, Other (See Comments)   Swelling around injection area      Medication List       Accurate as of March 24, 2020 12:53 PM. If you have any questions, ask your nurse or doctor.        albuterol 108 (90 Base) MCG/ACT inhaler Commonly known as: VENTOLIN HFA Inhale 2 puffs into the lungs every 6 (six) hours as needed for wheezing or shortness of breath.   alendronate 70 MG tablet Commonly known as: FOSAMAX Take 1 tablet (70 mg total) by mouth every 7 (seven) days. Take with a full glass of water on an empty stomach.   budesonide-formoterol 160-4.5 MCG/ACT inhaler Commonly known as: SYMBICORT Inhale 2 puffs into the lungs 2 (two) times daily.   buPROPion 150 MG 12 hr tablet Commonly known as: WELLBUTRIN SR Take 1 tablet (150 mg total) by mouth 2 (two) times daily.   busPIRone 10 MG tablet Commonly known as: BUSPAR Take 1 tablet (10 mg total) by mouth 3 (three) times daily.   ferrous fumarate 325 (106 Fe) MG Tabs tablet Commonly known as: HEMOCYTE - 106 mg FE Take 1 tablet by mouth daily.  Reported on 10/06/2015   levothyroxine 100 MCG tablet Commonly known as: SYNTHROID Take 1 tablet (100 mcg total) by mouth daily before breakfast.   Magnesium 250 MG Tabs Take 250 mg by mouth daily.   simvastatin 10 MG tablet Commonly known as: ZOCOR Take 1 tablet (10 mg total) by mouth  at bedtime.   Tdap 5-2.5-18.5 LF-MCG/0.5 injection Commonly known as: BOOSTRIX Inject 0.5 mLs into the muscle once for 1 dose. Started by: Howard Pouch, DO   traZODone 100 MG tablet Commonly known as: DESYREL Take 2 tablets (200 mg total) by mouth at bedtime.   venlafaxine XR 150 MG 24 hr capsule Commonly known as: EFFEXOR-XR Take 1 capsule (150 mg total) by mouth daily with breakfast.   VITAMIN B-12 PO Take 1,000 mg by mouth daily.       All past medical history, surgical history, allergies, family history, immunizations andmedications were updated in the EMR today and reviewed under the history and medication portions of their EMR.     ROS: Negative, with the exception of above mentioned in HPI   Objective:  BP 122/63   Pulse 62   Temp 98 F (36.7 C) (Oral)   Ht 5\' 4"  (1.626 m)   Wt 161 lb (73 kg)   SpO2 97%   BMI 27.64 kg/m  Body mass index is 27.64 kg/m. Gen: Afebrile. No acute distress.  Nontoxic in presentation well-developed, well-nourished, Caucasian female. HENT: AT. [place].  Eyes:Pupils Equal Round Reactive to light, Extraocular movements intact,  Conjunctiva without redness, discharge or icterus. Neck/lymp/endocrine: Supple, no lymphadenopathy, no thyromegaly CV: RRR no murmur, no edema, +2/4 P posterior tibialis pulses Chest: CTAB, no wheeze or crackles Abd: Soft.  Flat. NTND. BS present.  No masses palpated.  Skin: No rashes, purpura or petechiae.  Dry skin present Neuro:  Normal gait. PERLA. EOMi. Alert. Oriented x3 Psych: Normal affect, dress and demeanor. Normal speech. Normal thought content and judgment.   No exam data present No results found. No results  found for this or any previous visit (from the past 24 hour(s)).  Assessment/Plan: Kendra Ball is a 74 y.o. female present for OV for  Hypothyroidism, unspecified type Stable. Continue levothyroxine 100 mcg daily, unless labs indicate need for dose alteration.  Occasions will be refilled once results received TSH collected today  Dyslipidemia/Family history of premature coronary heart disease/Atherosclerosis of native coronary artery of native heart without angina pectoris -Stable -Continue Zocor -CBC, CMP and lipid panel collected today  COPD (chronic obstructive pulmonary disease) with chronic bronchitis (HCC)/smoker Stable -Continue Symbicort and albuterol  Strongly encouraged patient to stop smoking  Diffuse large B-cell lymphoma, unspecified body region [facility]) Patient in full remission.  Last chemo November 2019.  Following closely with oncology.  Osteopenia, unspecified location/multiple fractures - DG Bone Density repeat 2023. - Vitamin D (25 hydroxy) collected today. Continue Fosamax  GAD (generalized anxiety disorder)/MDD (major depressive disorder), recurrent episode, moderate ([place]) -Managed by psychiatry  Iron deficiency Patient taking supplementation - Iron, TIBC and Ferritin Panel - CBC  B12 deficiency Patient taken supplementation - Vitamin B12    Reviewed expectations re: course of current medical issues.  Discussed self-management of symptoms.  Outlined signs and symptoms indicating need for more acute intervention.  Patient verbalized understanding and all questions were answered.  Patient received an After-Visit Summary.     Orders Placed This Encounter  Procedures  . TSH  . Vitamin B12  . Lipid panel  . Comprehensive metabolic panel  . VITAMIN D 25 Hydroxy (Vit-D Deficiency, Fractures)  . Iron, TIBC and Ferritin Panel  . CBC   Meds ordered this encounter  Medications  . budesonide-formoterol (SYMBICORT) 160-4.5 MCG/ACT inhaler     Sig: Inhale 2 puffs into the lungs 2 (two) times daily.    Dispense:  1 each  Refill:  5  . simvastatin (ZOCOR) 10 MG tablet    Sig: Take 1 tablet (10 mg total) by mouth at bedtime.    Dispense:  90 tablet    Refill:  3  . alendronate (FOSAMAX) 70 MG tablet    Sig: Take 1 tablet (70 mg total) by mouth every 7 (seven) days. Take with a full glass of water on an empty stomach.    Dispense:  4 tablet    Refill:  11  . albuterol (VENTOLIN HFA) 108 (90 Base) MCG/ACT inhaler    Sig: Inhale 2 puffs into the lungs every 6 (six) hours as needed for wheezing or shortness of breath.    Dispense:  8 g    Refill:  2  . Tdap (BOOSTRIX) 5-2.5-18.5 LF-MCG/0.5 injection    Sig: Inject 0.5 mLs into the muscle once for 1 dose.    Dispense:  0.5 mL    Refill:  0     Note is dictated utilizing voice recognition software. Although note has been proof read prior to signing, occasional typographical errors still can be missed. If any questions arise, please do not hesitate to call for verification.   electronically signed by:  Howard Pouch, DO  [place]

## 2020-03-24 NOTE — Patient Instructions (Addendum)
I have refilled your meds for you and printed out the tetanus vaccine for you.   Next appt in 5.5 months. Happy Holidays.

## 2020-03-25 ENCOUNTER — Telehealth: Payer: Self-pay | Admitting: Family Medicine

## 2020-03-25 DIAGNOSIS — E538 Deficiency of other specified B group vitamins: Secondary | ICD-10-CM

## 2020-03-25 MED ORDER — LEVOTHYROXINE SODIUM 100 MCG PO TABS
100.0000 ug | ORAL_TABLET | Freq: Every day | ORAL | 3 refills | Status: DC
Start: 2020-03-25 — End: 2021-01-20

## 2020-03-25 MED ORDER — CYANOCOBALAMIN 2500 MCG SL SUBL: SUBLINGUAL_TABLET | SUBLINGUAL | 3 refills | Status: AC

## 2020-03-25 NOTE — Telephone Encounter (Signed)
Unable to LVM. VM full

## 2020-03-25 NOTE — Telephone Encounter (Signed)
Unable to reach patient or leave message. VM full

## 2020-03-25 NOTE — Telephone Encounter (Signed)
Please call patient Kendra Ball, kidney and thyroid function are normal> I have called in her thyroid medication refills Blood cell counts and electrolytes are normal Cholesterol panel looks great and is at goal. Vitamin D levels are great at 48 B12 levels are severely low at 176.>  I have called in a B12 supplement that is sublingual-placed under the tongue.  I would recommend the B12 that is sublingual because it is absorbed better than in the stomach and it is obvious by her severely low B12 counts that she is not absorbing B12 through her stomach.  If the supplement is not covered by her insurance she can pick up over-the-counter sublingual B12.

## 2020-03-26 NOTE — Telephone Encounter (Signed)
Patient advised of results and voiced understanding.

## 2020-03-27 ENCOUNTER — Encounter (HOSPITAL_COMMUNITY): Payer: Self-pay

## 2020-03-27 ENCOUNTER — Other Ambulatory Visit: Payer: Self-pay

## 2020-03-27 ENCOUNTER — Ambulatory Visit (HOSPITAL_COMMUNITY)
Admission: RE | Admit: 2020-03-27 | Discharge: 2020-03-27 | Disposition: A | Discharge status: Home / Self Care | Payer: Medicare PPO | Source: Ambulatory Visit | Attending: Oncology | Admitting: Oncology

## 2020-03-27 DIAGNOSIS — Z8572 Personal history of non-Hodgkin lymphomas: Secondary | ICD-10-CM | POA: Diagnosis not present

## 2020-03-27 DIAGNOSIS — Z122 Encounter for screening for malignant neoplasm of respiratory organs: Secondary | ICD-10-CM | POA: Diagnosis not present

## 2020-03-27 DIAGNOSIS — I2510 Atherosclerotic heart disease of native coronary artery without angina pectoris: Secondary | ICD-10-CM | POA: Diagnosis not present

## 2020-03-27 DIAGNOSIS — I700 Atherosclerosis of aorta: Secondary | ICD-10-CM | POA: Diagnosis not present

## 2020-03-27 DIAGNOSIS — F17210 Nicotine dependence, cigarettes, uncomplicated: Secondary | ICD-10-CM | POA: Diagnosis not present

## 2020-03-27 DIAGNOSIS — C8580 Other specified types of non-Hodgkin lymphoma, unspecified site: Secondary | ICD-10-CM

## 2020-03-27 DIAGNOSIS — J432 Centrilobular emphysema: Secondary | ICD-10-CM | POA: Insufficient documentation

## 2020-04-09 ENCOUNTER — Telehealth: Payer: Self-pay

## 2020-04-09 NOTE — Telephone Encounter (Signed)
-----   Message from Ladell Pier, MD sent at 04/07/2020  4:56 PM EST ----- Please call patient, CT is negative for cancer, tiny lung nodules-likely benign, recommend 1 year follow-up screening CT  She has evidence of coronary artery atherosclerosis, she should follow-up with her primary provider for this, send copy of CT to Dr. Raoul Pitch

## 2020-04-09 NOTE — Telephone Encounter (Signed)
Called made pt aware of CT scan negative for cancer, and to follow up with primary care pt was aware and is being followed by PCP

## 2020-04-15 ENCOUNTER — Telehealth: Payer: Self-pay | Admitting: Family Medicine

## 2020-04-15 NOTE — Telephone Encounter (Signed)
Patient mailbox is full   Called patient to schedule Annual Wellness Visit.  Please schedule with Nurse Health Advisor Caroleen Hamman, RN at [facility]

## 2020-04-22 ENCOUNTER — Telehealth: Payer: Self-pay | Admitting: Family Medicine

## 2020-04-22 NOTE — Telephone Encounter (Signed)
Patient would like someone to call her and review her CT results from 03/27/20.

## 2020-04-23 NOTE — Telephone Encounter (Signed)
Attempted to call pt and could not leave a vm

## 2020-04-23 NOTE — Telephone Encounter (Signed)
Unsure if you received results, please advise   Called made pt aware of CT scan negative for cancer, and to follow up with primary care pt was aware and is being followed by PCP   ----- Message from Ladene Artist, MD sent at 04/07/2020  4:56 PM EST ----- Please call patient, CT is negative for cancer, tiny lung nodules-likely benign, recommend 1 year follow-up screening CT  She has evidence of coronary artery atherosclerosis, she should follow-up with her primary provider for this, send copy of CT to Dr. Claiborne Billings

## 2020-04-23 NOTE — Telephone Encounter (Signed)
I do see a phone note from her oncology team that they called her and told her of her results (or at least attempted to) on 04/07/2020. Per that phone note it was "negative for cancer, tiny lung nodules looked benign" and they recommended a 1 year follow-up screening for CT.  Her CT was ordered by her oncology team.  If she is wanting someone to review the CT with her in more detail she should call the ordering provider to discuss.    It also said she had "evidence of coronary artery atherosclerosis and that she should make an appointment to follow-up with her PCP to discuss" the coronary artery atherosclerosis.  If this is what she is concerned about I would encourage her to make an appointment so that we can review and discuss.

## 2020-04-28 ENCOUNTER — Telehealth: Payer: Self-pay

## 2020-04-28 MED ORDER — FLUTICASONE-SALMETEROL 250-50 MCG/DOSE IN AEPB: 1.0000 | INHALATION_SPRAY | Freq: Two times a day (BID) | RESPIRATORY_TRACT | 5 refills | Status: DC

## 2020-04-28 NOTE — Telephone Encounter (Signed)
Attempted to contact pt and could not leave VM  

## 2020-04-28 NOTE — Telephone Encounter (Signed)
Pt was Rx'd symbicort inhaler and it is not covered by her insurance.  Step one medications are advair HFA/Diskus, fluticasone,wixela thatg should be covered by insurance.

## 2020-04-28 NOTE — Telephone Encounter (Signed)
Patient advised and voiced understanding. Pt scheduled for 05/01/20

## 2020-04-28 NOTE — Telephone Encounter (Signed)
wixela prescribed in place of Symbicort. Please mae pt aware

## 2020-04-28 NOTE — Addendum Note (Signed)
Addended by: Felix Pacini A on: 04/28/2020 03:35 PM   Modules accepted: Orders

## 2020-04-29 NOTE — Telephone Encounter (Signed)
Attempted to call pt and could not leave VM 

## 2020-05-01 ENCOUNTER — Ambulatory Visit: Payer: Medicare PPO | Admitting: Family Medicine

## 2020-05-05 ENCOUNTER — Ambulatory Visit (INDEPENDENT_AMBULATORY_CARE_PROVIDER_SITE_OTHER): Payer: Medicare PPO | Admitting: Family Medicine

## 2020-05-05 ENCOUNTER — Other Ambulatory Visit: Payer: Self-pay

## 2020-05-05 ENCOUNTER — Encounter: Payer: Self-pay | Admitting: Family Medicine

## 2020-05-05 VITALS — BP 129/65 | HR 67 | Temp 98.9°F | Ht 64.0 in | Wt 164.0 lb

## 2020-05-05 DIAGNOSIS — E785 Hyperlipidemia, unspecified: Secondary | ICD-10-CM

## 2020-05-05 DIAGNOSIS — Z800 Family history of malignant neoplasm of digestive organs: Secondary | ICD-10-CM | POA: Insufficient documentation

## 2020-05-05 DIAGNOSIS — Z8249 Family history of ischemic heart disease and other diseases of the circulatory system: Secondary | ICD-10-CM | POA: Diagnosis not present

## 2020-05-05 DIAGNOSIS — I2510 Atherosclerotic heart disease of native coronary artery without angina pectoris: Secondary | ICD-10-CM | POA: Diagnosis not present

## 2020-05-05 DIAGNOSIS — I700 Atherosclerosis of aorta: Secondary | ICD-10-CM | POA: Diagnosis not present

## 2020-05-05 DIAGNOSIS — F17200 Nicotine dependence, unspecified, uncomplicated: Secondary | ICD-10-CM | POA: Diagnosis not present

## 2020-05-05 DIAGNOSIS — I10 Essential (primary) hypertension: Secondary | ICD-10-CM | POA: Diagnosis not present

## 2020-05-05 DIAGNOSIS — I2583 Coronary atherosclerosis due to lipid rich plaque: Secondary | ICD-10-CM | POA: Diagnosis not present

## 2020-05-05 DIAGNOSIS — D509 Iron deficiency anemia, unspecified: Secondary | ICD-10-CM | POA: Insufficient documentation

## 2020-05-05 DIAGNOSIS — R635 Abnormal weight gain: Secondary | ICD-10-CM

## 2020-05-05 HISTORY — DX: Iron deficiency anemia, unspecified: D50.9

## 2020-05-05 HISTORY — DX: Family history of malignant neoplasm of digestive organs: Z80.0

## 2020-05-05 HISTORY — DX: Atherosclerosis of aorta: I70.0

## 2020-05-05 HISTORY — DX: Abnormal weight gain: R63.5

## 2020-05-05 NOTE — Progress Notes (Signed)
This visit occurred during the SARS-CoV-2 public health emergency.  Safety protocols were in place, including screening questions prior to the visit, additional usage of staff PPE, and extensive cleaning of exam room while observing appropriate contact time as indicated for disinfecting solutions.    Kendra Ball , 1946-01-26, 75 y.o., female MRN: [id number] Patient Care Team    Relationship Specialty Notifications Start End  Ma Hillock, DO PCP - General Family Medicine  12/30/14   Juanita Craver, MD Consulting Physician Gastroenterology  05/09/14   Excell Seltzer, MD (Inactive) Consulting Physician General Surgery  05/09/14   Brandon Melnick, [place] (Inactive)  Psychology  10/16/15   [name], MD  Psychiatry  12/12/16   Ladell Pier, MD Consulting Physician Oncology  04/23/20     Chief Complaint  Patient presents with  . Lung Cancer screening    CT      Subjective: Pt presents for an OV to discuss her most recent CT- lung cancer screen result. She has discussed result with her oncologist. Results also were positive for "aortic atherosclerosis, as well as atherosclerosis of the great vessels of the mediastinum and the coronary arteries, including calcified atherosclerotic plaque in the left anterior descending, left circumflex and right coronary arteries. Mild calcifications of the aortic valve." Pt has a fhx of CAD, she is a smoker, on a statin-LDL last 31. She walks about 2 miles a day- without chest pain. She does endorse becoming "winded" near the end of her walk. She has occasional "heartburn" not related to meals or activity.   Depression screen [facility] 2/9 03/24/2020 04/10/2018 02/16/2017 12/12/2016 10/16/2015  Decreased Interest 0 0 0 2 0  Down, Depressed, Hopeless 0 0 0 0 0  PHQ - 2 Score 0 0 0 2 0  Altered sleeping 0 0 - 0 -  Tired, decreased energy 0 2 - 3 -  Change in appetite 0 2 - 0 -  Feeling bad or failure about yourself  0 0 - 1 -  Trouble concentrating 0  2 - 0 -  Moving slowly or fidgety/restless 0 0 - 0 -  Suicidal thoughts 0 - - 0 -  PHQ-9 Score 0 6 - 6 -  Difficult doing work/chores - Not difficult at all - Not difficult at all -  Some encounter information is confidential and restricted. Go to Review Flowsheets activity to see all data.  Some recent data might be hidden    Allergies  Allergen Reactions  . Morphine Sulfate Swelling and Other (See Comments)    Swelling around injection area   Social History   Social History Narrative   Ms. Senseney lives in [place] with her husband. She has 3 grown children and several grand children. She has joint custody of 3 of her grand children as her daughter has bipolar disorder and has needed assistance with children in past.   1-2 cups coffee, 3-6 cups tea per day.   Right-handed.   Past Medical History:  Diagnosis Date  . Adenopathy    right supraclavicular  . Anxiety   . Arthritis   . Chronic diarrhea 10/13/2015  . COPD (chronic obstructive pulmonary disease) ([place])   . DEPRESSION 08/26/2008  . Headache   . Hx of colonic polyps    First noted on  colonoscopy 2012  . Hypercholesteremia   . HYPERTENSION 08/26/2008  . HYPOTHYROIDISM 08/26/2008  . Lymphoma ([place]) 2019   B-cell  . Obesity   . Osteoporosis   .  Substance abuse [facility])    inpatient and outpatient tx for substance abuse  . TOBACCO USER 12/25/2009   Past Surgical History:  Procedure Laterality Date  . AXILLARY LYMPH NODE BIOPSY Right 06/22/2017   Procedure: RIGHT AXILLARY LYMPH NODE BIOPSY;  Surgeon: [name], MD;  Location: [facility];  Service: General;  Laterality: Right;  . [place] SURGERY  2006  . CESAREAN SECTION    . COLON RESECTION N/A 05/09/2014   Procedure: LAPAROSCOPIC HAND-ASSISTED EXTENDED RIGHT COLECTOMY, LAPAROSCOPIC LYSIS OF ADHESIONS, SPLENIC FLEXURE MOBILIZATION.;  Surgeon: [name], MD;  Location: [facility];  Service: General;  Laterality: N/A;  . DILATION AND CURETTAGE OF UTERUS     x2  . FOREARM  FRACTURE SURGERY     right  . GASTRIC BYPASS  2006  . IR IMAGING GUIDED PORT INSERTION  12/01/2017  . IR REMOVAL TUN ACCESS W/ PORT W/O FL MOD SED  07/03/2018  . MASS EXCISION Right 02/24/2017   Procedure: EXCISIONAL BIOPSY RIGHT SUPRA-CLAVICULAR NODE;  Surgeon: [name], MD;  Location: [place];  Service: ENT;  Laterality: Right;  . TONSILLECTOMY     Family History  Problem Relation Age of Onset  . Cancer Mother        colon  . Depression Mother   . Alzheimer's disease Mother        Dementia  . Heart disease Father 30  . Heart disease Sister 66       stents, pacemaker  . Depression Sister   . Heart disease Brother 4       CABG, MI  . Depression Brother   . Obesity Daughter   . Suicidality Daughter   . Bipolar disorder Daughter   . Obesity Son   . Mental illness Daughter        bipolar  . Obesity Daughter   . Depression Sister   . Dementia Paternal Aunt   . Alcohol abuse Maternal Uncle   . Colon cancer Maternal Aunt   . Stomach cancer Maternal Grandfather    Allergies as of 05/05/2020      Reactions   Morphine Sulfate Swelling, Other (See Comments)   Swelling around injection area      Medication List       Accurate as of May 05, 2020 10:22 AM. If you have any questions, ask your nurse or doctor.        albuterol 108 (90 Base) MCG/ACT inhaler Commonly known as: VENTOLIN HFA Inhale 2 puffs into the lungs every 6 (six) hours as needed for wheezing or shortness of breath.   alendronate 70 MG tablet Commonly known as: FOSAMAX Take 1 tablet (70 mg total) by mouth every 7 (seven) days. Take with a full glass of water on an empty stomach.   buPROPion 150 MG 12 hr tablet Commonly known as: WELLBUTRIN SR Take 1 tablet (150 mg total) by mouth 2 (two) times daily.   busPIRone 10 MG tablet Commonly known as: BUSPAR Take 1 tablet (10 mg total) by mouth 3 (three) times daily.   Cyanocobalamin 2500 MCG Subl 1 tab sublingual daily   ferrous  fumarate 325 (106 Fe) MG Tabs tablet Commonly known as: HEMOCYTE - 106 mg FE Take 1 tablet by mouth daily. Reported on 10/06/2015   Fluticasone-Salmeterol 250-50 MCG/DOSE Aepb Commonly known as: Wixela Inhub Inhale 1 puff into the lungs in the morning and at bedtime.   levothyroxine 100 MCG tablet Commonly known as: SYNTHROID Take 1 tablet (100 mcg total) by  mouth daily before breakfast.   Magnesium 250 MG Tabs Take 250 mg by mouth daily.   simvastatin 10 MG tablet Commonly known as: ZOCOR Take 1 tablet (10 mg total) by mouth at bedtime.   traZODone 100 MG tablet Commonly known as: DESYREL Take 2 tablets (200 mg total) by mouth at bedtime.   venlafaxine XR 150 MG 24 hr capsule Commonly known as: EFFEXOR-XR Take 1 capsule (150 mg total) by mouth daily with breakfast.       All past medical history, surgical history, allergies, family history, immunizations andmedications were updated in the EMR today and reviewed under the history and medication portions of their EMR.     ROS: Negative, with the exception of above mentioned in HPI   Objective:  BP 129/65   Pulse 67   Temp 98.9 F (37.2 C) (Oral)   Ht 5\' 4"  (1.626 m)   Wt 164 lb (74.4 kg)   SpO2 98%   BMI 28.15 kg/m  Body mass index is 28.15 kg/m. Gen: Afebrile. No acute distress. Nontoxic in appearance, well developed, well nourished.  HENT: AT. .  Eyes:Pupils Equal Round Reactive to light, Extraocular movements intact,  Conjunctiva without redness, discharge or icterus. CV: RRR no murmur, no edema Chest: CTAB, no wheeze or crackles. Good air movement, normal resp effort.  Neuro:  Alert. Oriented x3  No exam data present No results found. No results found for this or any previous visit (from the past 24 hour(s)).  Assessment/Plan: MONETTA LICK is a 75 y.o. female present for OV for  Family history of premature coronary heart disease/Dyslipidemia/Smoker/Coronary artery disease due to lipid rich  plaque/aortic atheroscleroisis Lengthy discussion on CAD, dietary changes, statin, exercise, smoking cessation and further eval by cardio.  - She will work on the smoking cessation.  - she will try to make the dietary changes discussed today.  - she is exercising, but will increase.  - she is asymptomatic at this time- only mild shortness of breath at the end of her 2 mile walk- no chest pain.  - she is familiar with stress test, cardiac cath/stents, CABG etc- a few of her siblings have had to have intervention.  - discussed cardiac CT as a possibility as well if cardio feels appropriate.  - continue statin. LDL goal < 70> at goal.  - Ambulatory referral to Cardiology   Reviewed expectations re: course of current medical issues.  Discussed self-management of symptoms.  Outlined signs and symptoms indicating need for more acute intervention.  Patient verbalized understanding and all questions were answered.  Patient received an After-Visit Summary.    Orders Placed This Encounter  Procedures  . Ambulatory referral to Cardiology   No orders of the defined types were placed in this encounter.   Referral Orders     Ambulatory referral to Cardiology   Note is dictated utilizing voice recognition software. Although note has been proof read prior to signing, occasional typographical errors still can be missed. If any questions arise, please do not hesitate to call for verification.   electronically signed by:  66, DO  [facility] - OR

## 2020-05-05 NOTE — Patient Instructions (Signed)
Smoking also makes you at high risk.   Coronary Artery Disease, Female Coronary artery disease (CAD) is a condition in which the arteries that lead to the heart (coronary arteries) become narrow or blocked. The narrowing or blockage can lead to decreased blood flow to the heart. Prolonged reduced blood flow can cause a heart attack (myocardial infarction or MI). This condition may also be called coronary heart disease. Because CAD is the leading cause of death in women, it is important to understand what causes this condition and how it is treated. What are the causes? CAD is most often caused by atherosclerosis. This is the buildup of fat and cholesterol (plaque) on the inside of the arteries. Over time, the plaque may narrow or block the artery, reducing blood flow to the heart. Plaque can also become weak and break off within a coronary artery and cause a sudden blockage. Other less common causes of CAD include:  A blood clot or a piece of a blood clot or other substance that blocks the flow of blood in a coronary artery (embolism).  A tearing of the artery (spontaneous coronary artery dissection).  An enlargement of an artery (aneurysm).  Inflammation (vasculitis) in the artery wall.   What increases the risk? The following factors may make you more likely to develop this condition:  Age. Women over age 39 are at a greater risk of CAD.  Family history of CAD.  High blood pressure (hypertension).  Diabetes.  High cholesterol levels.  Tobacco use.  Lack of exercise.  Menopause. ? All postmenopausal women are at greater risk of CAD. ? Women who have experienced menopause between the ages of 33-45 (early menopause) are at a higher risk of CAD. ? Women who have experienced menopause before age 66 (premature menopause) are at a very high risk of CAD.  Excessive alcohol use.  A diet high in saturated and trans fats, such as fried food and processed meat. Other possible risk  factors include:  High stress levels.  Depression.  Obesity.  Sleep apnea. What are the signs or symptoms? Many people do not have any symptoms during the early stages of CAD. As the condition progresses, symptoms may include:  Chest pain (angina). The pain can: ? Feel like crushing or squeezing, or like a tightness, pressure, fullness, or heaviness in the chest. ? Last more than a few minutes or can stop and recur. The pain tends to get worse with exercise or stress and to fade with rest.  Pain in the arms, neck, jaw, ear, or back.  Unexplained heartburn or indigestion.  Shortness of breath.  Nausea.  Sudden cold sweats.  Sudden light-headedness.  Fluttering or fast heartbeat (palpitations). Many women have chest discomfort and the other symptoms. However, women often have unusual (atypical) symptoms, such as:  Fatigue.  Vomiting.  Unexplained feelings of nervousness or anxiety.  Unexplained weakness.  Dizziness or fainting. How is this diagnosed? This condition is diagnosed based on:  Your family and medical history.  A physical exam.  Tests, including: ? A test to check the electrical signals in your heart (electrocardiogram). ? Exercise stress test. This looks for signs of blockage when the heart is stressed with exercise, such as running on a treadmill. ? Pharmacologic stress test. This test looks for signs of blockage when the heart is being stressed with a medicine. ? Blood tests. ? Coronary angiogram. This is a procedure to look at the coronary arteries to see if there is any blockage. During  this test, a dye is injected into your arteries so they appear on an X-ray. ? Coronary artery CT scan. This CT scan helps detect calcium deposits in your coronary arteries. Calcium deposits are an indicator of CAD. ? A test that uses sound waves to take a picture of your heart (echocardiogram). ? Chest X-ray. How is this treated? This condition may be treated  by:  Healthy lifestyle changes to reduce risk factors.  Medicines such as: ? Antiplatelet medicines and blood-thinning medicines, such as aspirin. These help to prevent blood clots. ? Nitroglycerin. ? Blood pressure medicines. ? Cholesterol-lowering medicine.  Coronary angioplasty and stenting. During this procedure, a thin, flexible tube is inserted through a blood vessel and into a blocked artery. A balloon or similar device on the end of the tube is inflated to open up the artery. In some cases, a small, mesh tube (stent) is inserted into the artery to keep it open.  Coronary artery bypass surgery. During this surgery, veins or arteries from other parts of the body are used to create a bypass around the blockage and allow blood to reach your heart. Follow these instructions at home: Medicines  Take over-the-counter and prescription medicines only as told by your health care provider.  Do not take the following medicines unless your health care provider approves: ? NSAIDs, such as ibuprofen, naproxen, or celecoxib. ? Vitamin supplements that contain vitamin A, vitamin E, or both. ? Hormone replacement therapy that contains estrogen with or without progestin. Lifestyle  Follow an exercise program approved by your health care provider. Aim for 150 minutes of moderate exercise or 75 minutes of vigorous exercise each week.  Maintain a healthy weight or lose weight as approved by your health care provider.  Learn to manage stress or try to limit your stress. Ask your health care provider for suggestions if you need help.  Get screened for depression and seek treatment, if needed.  Do not use any products that contain nicotine or tobacco, such as cigarettes, e-cigarettes, and chewing tobacco. If you need help quitting, ask your health care provider.  Do not use illegal drugs. Eating and drinking  Follow a heart-healthy diet. A dietitian can help educate you about healthy food options  and changes. In general, eat plenty of fruits and vegetables, lean meats, and whole grains.  Avoid foods high in: ? Sugar. ? Salt (sodium). ? Saturated fats, such as processed or fatty meat. ? Trans fats, such as fried food.  Use healthy cooking methods such as roasting, grilling, broiling, baking, poaching, steaming, or stir-frying.  Do not drink alcohol if: ? Your health care provider tells you not to drink. ? You are pregnant, may be pregnant, or are planning to become pregnant.  If you drink alcohol: ? Limit how much you have to 0-1 drink a day. ? Be aware of how much alcohol is in your drink. In the [place] one drink equals one 12 oz bottle of beer (355 mL), one 5 oz glass of wine (148 mL), or one 1 oz glass of hard liquor (44 mL).   General instructions  Manage any other health conditions, such as hypertension and diabetes. These conditions affect your heart.  Your health care provider may ask you to monitor your blood pressure. Ideally, your blood pressure should be below 130/80.  Keep all follow-up visits as told by your health care provider. This is important. Get help right away if:  You have pain in your chest, neck, ear, arm, jaw,  stomach, or back that: ? Lasts more than a few minutes. ? Is recurring. ? Is not relieved by taking medicine under your tongue (sublingual nitroglycerin).  You have profuse sweating without cause.  You have unexplained: ? Heartburn or indigestion. ? Shortness of breath or difficulty breathing. ? Fluttering or fast heartbeat (palpitations). ? Nausea or vomiting. ? Fatigue. ? Feelings of nervousness or anxiety. ? Weakness. ? Diarrhea.  You have sudden light-headedness or dizziness.  You faint.  You feel like hurting yourself or think about taking your own life. These symptoms may represent a serious problem that is an emergency. Do not wait to see if the symptoms will go away. Get medical help right away. Call your local emergency  services (911 in the [place] Do not drive yourself to the hospital. Summary  Coronary artery disease (CAD) is a condition in which the arteries that lead to the heart (coronary arteries) become narrow or blocked. The narrowing or blockage can lead to a heart attack.  Many women have chest discomfort and other common symptoms of CAD. However, women often have unusual (atypical) symptoms, such as fatigue, vomiting, weakness, or dizziness.  CAD can be treated with lifestyle changes, medicines, surgery, or a combination of these treatments. This information is not intended to replace advice given to you by your health care provider. Make sure you discuss any questions you have with your health care provider. Document Revised: 12/29/2017 Document Reviewed: 12/19/2017 Elsevier Patient Education  2021 [employer].    Preventing High Cholesterol Cholesterol is a white, waxy substance similar to fat that the human body needs to help build cells. The liver makes all the cholesterol that a person's body needs. Having high cholesterol (hypercholesterolemia) increases your risk for heart disease and stroke. Extra or excess cholesterol comes from the food that you eat. High cholesterol can often be prevented with diet and lifestyle changes. If you already have high cholesterol, you can control it with diet, lifestyle changes, and medicines. How can high cholesterol affect me? If you have high cholesterol, fatty deposits (plaques) may build up on the walls of your blood vessels. The blood vessels that carry blood away from your heart are called arteries. Plaques make the arteries narrower and stiffer. This in turn can:  Restrict or block blood flow and cause blood clots to form.  Increase your risk for heart attack and stroke. What can increase my risk for high cholesterol? This condition is more likely to develop in people who:  Eat foods that are high in saturated fat or cholesterol. Saturated fat is  mostly found in foods that come from animal sources.  Are overweight.  Are not getting enough exercise.  Have a family history of high cholesterol (familial hypercholesterolemia). What actions can I take to prevent this? Nutrition  Eat less saturated fat.  Avoid trans fats (partially hydrogenated oils). These are often found in margarine and in some baked goods, fried foods, and snacks bought in packages.  Avoid precooked or cured meat, such as bacon, sausages, or meat loaves.  Avoid foods and drinks that have added sugars.  Eat more fruits, vegetables, and whole grains.  Choose healthy sources of protein, such as fish, poultry, lean cuts of red meat, beans, peas, lentils, and nuts.  Choose healthy sources of fat, such as: ? Nuts. ? Vegetable oils, especially olive oil. ? Fish that have healthy fats, such as omega-3 fatty acids. These fish include mackerel or salmon.   Lifestyle  Lose weight if you  are overweight. Maintaining a healthy body mass index (BMI) can help prevent or control high cholesterol. It can also lower your risk for diabetes and high blood pressure. Ask your health care provider to help you with a diet and exercise plan to lose weight safely.  Do not use any products that contain nicotine or tobacco, such as cigarettes, e-cigarettes, and chewing tobacco. If you need help quitting, ask your health care provider. Alcohol use  Do not drink alcohol if: ? Your health care provider tells you not to drink. ? You are pregnant, may be pregnant, or are planning to become pregnant.  If you drink alcohol: ? Limit how much you use to:  0-1 drink a day for women.  0-2 drinks a day for men. ? Be aware of how much alcohol is in your drink. In the [place] one drink equals one 12 oz bottle of beer (355 mL), one 5 oz glass of wine (148 mL), or one 1 oz glass of hard liquor (44 mL). Activity  Get enough exercise. Do exercises as told by your health care provider.  Each  week, do at least 150 minutes of exercise that takes a medium level of effort (moderate-intensity exercise). This kind of exercise: ? Makes your heart beat faster while allowing you to still be able to talk. ? Can be done in short sessions several times a day or longer sessions a few times a week. For example, on 5 days each week, you could walk fast or ride your bike 3 times a day for 10 minutes each time.   Medicines  Your health care provider may recommend medicines to help lower cholesterol. This may be a medicine to lower the amount of cholesterol that your liver makes. You may need medicine if: ? Diet and lifestyle changes have not lowered your cholesterol enough. ? You have high cholesterol and other risk factors for heart disease or stroke.  Take over-the-counter and prescription medicines only as told by your health care provider. General information  Manage your risk factors for high cholesterol. Talk with your health care provider about all your risk factors and how to lower your risk.  Manage other conditions that you have, such as diabetes or high blood pressure (hypertension).  Have blood tests to check your cholesterol levels at regular points in time as told by your health care provider.  Keep all follow-up visits as told by your health care provider. This is important. Where to find more information  American Heart Association: www.heart.org  National Heart, Lung, and Blood Institute: https://wilson-eaton.com/ Summary  High cholesterol increases your risk for heart disease and stroke. By keeping your cholesterol level low, you can reduce your risk for these conditions.  High cholesterol can often be prevented with diet and lifestyle changes.  Work with your health care provider to manage your risk factors, and have your blood tested regularly. This information is not intended to replace advice given to you by your health care provider. Make sure you discuss any questions you  have with your health care provider. Document Revised: 01/22/2019 Document Reviewed: 01/22/2019 Elsevier Patient Education  [place].

## 2020-05-08 ENCOUNTER — Ambulatory Visit (INDEPENDENT_AMBULATORY_CARE_PROVIDER_SITE_OTHER): Payer: Medicare PPO | Admitting: Cardiology

## 2020-05-08 ENCOUNTER — Other Ambulatory Visit: Payer: Self-pay

## 2020-05-08 ENCOUNTER — Encounter: Payer: Self-pay | Admitting: Cardiology

## 2020-05-08 VITALS — BP 136/76 | HR 76 | Ht 64.0 in | Wt 162.0 lb

## 2020-05-08 DIAGNOSIS — R0600 Dyspnea, unspecified: Secondary | ICD-10-CM | POA: Diagnosis not present

## 2020-05-08 DIAGNOSIS — R079 Chest pain, unspecified: Secondary | ICD-10-CM | POA: Diagnosis not present

## 2020-05-08 DIAGNOSIS — I2510 Atherosclerotic heart disease of native coronary artery without angina pectoris: Secondary | ICD-10-CM

## 2020-05-08 DIAGNOSIS — J449 Chronic obstructive pulmonary disease, unspecified: Secondary | ICD-10-CM

## 2020-05-08 DIAGNOSIS — I10 Essential (primary) hypertension: Secondary | ICD-10-CM | POA: Diagnosis not present

## 2020-05-08 DIAGNOSIS — F17200 Nicotine dependence, unspecified, uncomplicated: Secondary | ICD-10-CM

## 2020-05-08 DIAGNOSIS — R0609 Other forms of dyspnea: Secondary | ICD-10-CM | POA: Insufficient documentation

## 2020-05-08 DIAGNOSIS — R0989 Other specified symptoms and signs involving the circulatory and respiratory systems: Secondary | ICD-10-CM | POA: Diagnosis not present

## 2020-05-08 HISTORY — DX: Other specified symptoms and signs involving the circulatory and respiratory systems: R09.89

## 2020-05-08 HISTORY — DX: Other forms of dyspnea: R06.09

## 2020-05-08 HISTORY — DX: Dyspnea, unspecified: R06.00

## 2020-05-08 MED ORDER — ROSUVASTATIN CALCIUM 10 MG PO TABS: 10.0000 mg | ORAL_TABLET | Freq: Every day | ORAL | 3 refills | Status: DC

## 2020-05-08 NOTE — Progress Notes (Signed)
Cardiology Consultation:    Date:  05/08/2020   ID:  Kendra Ball, DOB [birth year], MRN [id number]  PCP:  Ma Hillock, DO  Cardiologist:  Jenne Campus, MD   Referring MD: Ma Hillock, DO   Chief Complaint  Patient presents with  . abnormal finding    History of Present Illness:    Kendra Ball is a 75 y.o. female who is being seen today for the evaluation of coronary calcifications at the request of Kuneff, [place], DO.  She is a sweet lady with past medical history significant for chronic smoking, COPD, depression, hypertension.  She did have CT of her chest on for cancer screening because of history of smoking she was found to have significant calcification 3 of her arteries left main left anterior descending circumflex as well as right coronary artery.  She says she is doing well overall she walk on the regular basis with her daughter however they walk very slowly they work at speed about 2 mph and they talk all the time a day walk she does it about 3 times a week.  Denies have any chest pain tightness squeezing pressure burning chest just shortness of breath when she walks.  There is no swelling of lower extremities there is no palpitations there is no dizziness there is no passing out.  She does have some history of alcohol abuse but she does not drink anymore.  She still continues to smoke she did have multiple trials of quitting smoking before but never succeeded. Risk factors for coronary artery disease include multiple family members that have problem prematurely so she clearly have premature history of coronary artery disease. She does have dyslipidemia however she said her cholesterol is quite reasonable her LDL is 61 HDL 89 she is on low intensity statin now. She does have history of hypertension obviously smoking which is significant risk factor.  Past Medical History:  Diagnosis Date  . Adenopathy    right supraclavicular  . Anxiety   . Arthritis   .  Chronic diarrhea 10/13/2015  . COPD (chronic obstructive pulmonary disease) ([place])   . DEPRESSION 08/26/2008  . Headache   . Hx of colonic polyps    First noted on  colonoscopy 2012  . Hypercholesteremia   . HYPERTENSION 08/26/2008  . HYPOTHYROIDISM 08/26/2008  . 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 Obesity   . Osteoporosis   . Substance abuse [facility])    inpatient and outpatient tx for substance abuse  . TOBACCO USER 12/25/2009    Past Surgical History:  Procedure Laterality Date  . AXILLARY LYMPH NODE BIOPSY Right 06/22/2017   Procedure: RIGHT AXILLARY LYMPH NODE BIOPSY;  Surgeon: [name], MD;  Location: [facility];  Service: General;  Laterality: Right;  . [place] SURGERY  2006  . CESAREAN SECTION    . COLON RESECTION N/A 05/09/2014   Procedure: LAPAROSCOPIC HAND-ASSISTED EXTENDED RIGHT COLECTOMY, LAPAROSCOPIC LYSIS OF ADHESIONS, SPLENIC FLEXURE MOBILIZATION.;  Surgeon: [name], MD;  Location: [facility];  Service: General;  Laterality: N/A;  . DILATION AND CURETTAGE OF UTERUS     x2  . FOREARM FRACTURE SURGERY     right  . GASTRIC BYPASS  2006  . IR IMAGING GUIDED PORT INSERTION  12/01/2017  . IR REMOVAL TUN ACCESS W/ PORT W/O FL MOD SED  07/03/2018  . MASS EXCISION Right 02/24/2017   Procedure: EXCISIONAL BIOPSY RIGHT SUPRA-CLAVICULAR NODE;  Surgeon: [name], MD;  Location: [facility];  Service: ENT;  Laterality: Right;  . TONSILLECTOMY      Current Medications: Current Meds  Medication Sig  . albuterol (VENTOLIN HFA) 108 (90 Base) MCG/ACT inhaler Inhale 2 puffs into the lungs every 6 (six) hours as needed for wheezing or shortness of breath.  [name] alendronate (FOSAMAX) 70 MG tablet Take 1 tablet (70 mg total) by mouth every 7 (seven) days. Take with a full glass of water on an empty stomach.  [name] buPROPion (WELLBUTRIN SR) 150 MG 12 hr tablet Take 1 tablet (150 mg total) by mouth 2 (two) times daily.  . busPIRone (BUSPAR) 10 MG tablet Take 1 tablet (10 mg total) by  mouth 3 (three) times daily.  . Cyanocobalamin 2500 MCG SUBL 1 tab sublingual daily  . ferrous fumarate (HEMOCYTE - 106 MG FE) 325 (106 FE) MG TABS Take 1 tablet by mouth daily. Reported on 10/06/2015  . levothyroxine (SYNTHROID) 100 MCG tablet Take 1 tablet (100 mcg total) by mouth daily before breakfast.  . Magnesium 250 MG TABS Take 250 mg by mouth daily.   . simvastatin (ZOCOR) 10 MG tablet Take 1 tablet (10 mg total) by mouth at bedtime.  . traZODone (DESYREL) 100 MG tablet Take 2 tablets (200 mg total) by mouth at bedtime.  [name] venlafaxine XR (EFFEXOR-XR) 150 MG 24 hr capsule Take 1 capsule (150 mg total) by mouth daily with breakfast.     Allergies:   Morphine sulfate   Social History   Socioeconomic History  . Marital status: Married    Spouse name: Not on file  . Number of children: 3  . Years of education: Not on file  . Highest education level: Not on file  Occupational History  . Occupation: RETIRED    Employer: [employer]  Tobacco Use  . Smoking status: Current Every Day Smoker    Packs/day: 1.00    Years: 50.00    Pack years: 50.00    Types: Cigarettes  . Smokeless tobacco: Never Used  Vaping Use  . Vaping Use: Never used  Substance and Sexual Activity  . Alcohol use: No    Comment: quit on Aug 27, 2015  . Drug use: No    Frequency: 20.0 times per week    Comment: last used May 2017  . Sexual activity: Not Currently    Partners: Male    Birth control/protection: Post-menopausal  Other Topics Concern  . Not on file  Social History Narrative   Ms. Ellerby lives in [place] with her husband. She has 3 grown children and several grand children. She has joint custody of 3 of her grand children as her daughter has bipolar disorder and has needed assistance with children in past.   1-2 cups coffee, 3-6 cups tea per day.   Right-handed.   Social Determinants of Health   Financial Resource Strain: Not on file  Food Insecurity: Not on file   Transportation Needs: Not on file  Physical Activity: Not on file  Stress: Not on file  Social Connections: Not on file     Family History: The patient's family history includes Alcohol abuse in her maternal uncle; Alzheimer's disease in her mother; Bipolar disorder in her daughter; Cancer in her mother; Colon cancer in her maternal aunt; Dementia in her paternal aunt; Depression in her brother, mother, sister, and sister; Heart disease (age of onset: 49) in her father; Heart disease (age of onset: 85) in her sister; Heart disease (age of onset: 2) in her  brother; Mental illness in her daughter; Obesity in her daughter, daughter, and son; Stomach cancer in her maternal grandfather; Suicidality in her daughter. ROS:   Please see the history of present illness.    All 14 point review of systems negative except as described per history of present illness.  EKGs/Labs/Other Studies Reviewed:    The following studies were reviewed today:   EKG:  EKG is  ordered today.  The ekg ordered today demonstrates normal sinus rhythm normal P interval right bundle branch block, nonspecific ST segment changes  Recent Labs: 03/24/2020: ALT 13; BUN 8; Creatinine, Ser 0.73; Hemoglobin 14.8; Platelets 236.0; Potassium 4.6; Sodium 141; TSH 1.31  Recent Lipid Panel    Component Value Date/Time   CHOL 168 03/24/2020 1014   TRIG 86.0 03/24/2020 1014   HDL 89.90 03/24/2020 1014   CHOLHDL 2 03/24/2020 1014   VLDL 17.2 03/24/2020 1014   LDLCALC 61 03/24/2020 1014   LDLDIRECT 133.5 08/09/2012 1036    Physical Exam:    VS:  BP 136/76 (BP Location: Right Arm, Patient Position: Sitting)   Pulse 76   Ht 5\' 4"  (1.626 m)   Wt 162 lb (73.5 kg)   SpO2 92%   BMI 27.81 kg/m     Wt Readings from Last 3 Encounters:  05/08/20 162 lb (73.5 kg)  05/05/20 164 lb (74.4 kg)  03/24/20 161 lb (73 kg)     GEN:  Well nourished, well developed in no acute distress HEENT: Normal NECK: No JVD; soft 3 on the right  side LYMPHATICS: No lymphadenopathy CARDIAC: RRR, no murmurs, no rubs, no gallops RESPIRATORY:  Clear to auscultation without rales, wheezing or rhonchi  ABDOMEN: Soft, non-tender, non-distended MUSCULOSKELETAL:  No edema; No deformity  SKIN: Warm and dry NEUROLOGIC:  Alert and oriented x 3 PSYCHIATRIC:  Normal affect   ASSESSMENT:    1. Essential hypertension   2. Dyspnea on exertion   3. Bruit of right carotid artery   4. Atherosclerosis of native coronary artery of native heart without angina pectoris   5. COPD (chronic obstructive pulmonary disease) with chronic bronchitis ([place])   6. Smoker    PLAN:    In order of problems listed above:  1. Calcification of the coronary arteries noted on the CT.  Look like she does not have much symptoms but at the same time in spite of the fact she is trying to walk on the regular basis her exercise capacity is limited because of shortness of breath related to COPD.  Of course I cannot rule out possibility of anginal equivalent.  We had a long discussion what to do with the situation I think the best approach will be to perform stress testing.  We await the do have established diagnosis of coronary artery disease based on the fact that she has significant calcification of her coronary artery.  She is taking aspirin which advised her to continue.  I will increase her statin therapy from simvastatin to Crestor 20.  She need to be on high intensity statin since she does have coronary artery disease as proven by calcification of the coronary arteries. 2. Dyspnea on exertion we will schedule her to have echocardiogram to assess left ventricle ejection fraction.  Also will look at the right ventricle size and function since she does have right bundle branch block. 3. COPD obviously a problem and we had a long discussion about need to quit she understands she will try to work on it. 4. Smoking as  discussed above she is trying to work on quitting. 5. Bruit on  the right side of her neck very soft but we will do carotic ultrasound.   Medication Adjustments/Labs and Tests Ordered: Current medicines are reviewed at length with the patient today.  Concerns regarding medicines are outlined above.  No orders of the defined types were placed in this encounter.  No orders of the defined types were placed in this encounter.   Signed, Park Liter, MD, [facility]. 05/08/2020 2:31 PM    [place]

## 2020-05-08 NOTE — Patient Instructions (Signed)
Medication Instructions:  Your physician has recommended you make the following change in your medication:  Discontinue the Simvastatin and start taking Rosuvastatin/Crestor 10 mg once daily  *If you need a refill on your cardiac medications before your next appointment, please call your pharmacy*   Lab Work: NONE If you have labs (blood work) drawn today and your tests are completely normal, you will receive your results only by: [name] MyChart Message (if you have MyChart) OR . A paper copy in the mail If you have any lab test that is abnormal or we need to change your treatment, we will call you to review the results.   Testing/Procedures:   [facility] Cardiovascular Imaging at [facility] [address] [place], [place] [address] Phone: [phone]    Please arrive 15 minutes prior to your appointment time for registration and insurance purposes.  The test will take approximately 3 to 4 hours to complete; you may bring reading material.  If someone comes with you to your appointment, they will need to remain in the main lobby due to limited space in the testing area. **If you are pregnant or breastfeeding, please notify the nuclear lab prior to your appointment**  How to prepare for your Myocardial Perfusion Test: . Do not eat or drink 3 hours prior to your test, except you may have water. . Do not consume products containing caffeine (regular or decaffeinated) 12 hours prior to your test. (ex: coffee, chocolate, sodas, tea). .  . Do wear comfortable clothes (no dresses or overalls) and walking shoes, tennis shoes preferred (No heels or open toe shoes are allowed). . Do NOT wear cologne, perfume, aftershave, or lotions (deodorant is allowed). . If these instructions are not followed, your test will have to be rescheduled.  Please report to [address] for your test.  If you have questions or concerns about your appointment, you can call the  Nuclear Lab at [phone].  If you cannot keep your appointment, please provide 24 hours notification to the Nuclear Lab, to avoid a possible $50 charge to your account.  Your physician has requested that you have an echocardiogram. Echocardiography is a painless test that uses sound waves to create images of your heart. It provides your doctor with information about the size and shape of your heart and how well your heart's chambers and valves are working. This procedure takes approximately one hour. There are no restrictions for this procedure.  Your physician has requested that you have a carotid duplex. This test is an ultrasound of the carotid arteries in your neck. It looks at blood flow through these arteries that supply the brain with blood. Allow one hour for this exam. There are no restrictions or special instructions.       Follow-Up: At [facility], you and your health needs are our priority.  As part of our continuing mission to provide you with exceptional heart care, we have created designated Provider Care Teams.  These Care Teams include your primary Cardiologist (physician) and Advanced Practice Providers (APPs -  Physician Assistants and Nurse Practitioners) who all work together to provide you with the care you need, when you need it.  We recommend signing up for the patient portal called "MyChart".  Sign up information is provided on this After Visit Summary.  MyChart is used to connect with patients for Virtual Visits (Telemedicine).  Patients are able to view lab/test results, encounter notes, upcoming appointments, etc.  Non-urgent  messages can be sent to your provider as well.   To learn more about what you can do with MyChart, go to [email or web address].    Your next appointment:   2 month(s)  The format for your next appointment:   In Person  Provider:   Jenne Campus, MD   Other Instructions  Echocardiogram An echocardiogram is a test that uses  sound waves (ultrasound) to produce images of the heart. Images from an echocardiogram can provide important information about:  Heart size and shape.  The size and thickness and movement of your heart's walls.  Heart muscle function and strength.  Heart valve function or if you have stenosis. Stenosis is when the heart valves are too narrow.  If blood is flowing backward through the heart valves (regurgitation).  A tumor or infectious growth around the heart valves.  Areas of heart muscle that are not working well because of poor blood flow or injury from a heart attack.  Aneurysm detection. An aneurysm is a weak or damaged part of an artery wall. The wall bulges out from the normal force of blood pumping through the body. Tell a health care provider about:  Any allergies you have.  All medicines you are taking, including vitamins, herbs, eye drops, creams, and over-the-counter medicines.  Any blood disorders you have.  Any surgeries you have had.  Any medical conditions you have.  Whether you are pregnant or may be pregnant. What are the risks? Generally, this is a safe test. However, problems may occur, including an allergic reaction to dye (contrast) that may be used during the test. What happens before the test? No specific preparation is needed. You may eat and drink normally. What happens during the test?  You will take off your clothes from the waist up and put on a hospital gown.  Electrodes or electrocardiogram (ECG)patches may be placed on your chest. The electrodes or patches are then connected to a device that monitors your heart rate and rhythm.  You will lie down on a table for an ultrasound exam. A gel will be applied to your chest to help sound waves pass through your skin.  A handheld device, called a transducer, will be pressed against your chest and moved over your heart. The transducer produces sound waves that travel to your heart and bounce back (or  "echo" back) to the transducer. These sound waves will be captured in real-time and changed into images of your heart that can be viewed on a video monitor. The images will be recorded on a computer and reviewed by your health care provider.  You may be asked to change positions or hold your breath for a short time. This makes it easier to get different views or better views of your heart.  In some cases, you may receive contrast through an IV in one of your veins. This can improve the quality of the pictures from your heart. The procedure may vary among health care providers and hospitals.   What can I expect after the test? You may return to your normal, everyday life, including diet, activities, and medicines, unless your health care provider tells you not to do that. Follow these instructions at home:  It is up to you to get the results of your test. Ask your health care provider, or the department that is doing the test, when your results will be ready.  Keep all follow-up visits. This is important. Summary  An echocardiogram is a test that uses  sound waves (ultrasound) to produce images of the heart.  Images from an echocardiogram can provide important information about the size and shape of your heart, heart muscle function, heart valve function, and other possible heart problems.  You do not need to do anything to prepare before this test. You may eat and drink normally.  After the echocardiogram is completed, you may return to your normal, everyday life, unless your health care provider tells you not to do that. This information is not intended to replace advice given to you by your health care provider. Make sure you discuss any questions you have with your health care provider. Document Revised: 12/03/2019 Document Reviewed: 12/03/2019 Elsevier Patient Education  2021 [employer].  Nuclear Medicine Exam A nuclear medicine exam is a safe and painless imaging test. It helps your  health care provider detect and diagnose diseases. It also provides information about the ways your organs work and how they are structured. For a nuclear medicine exam, you will be given a radioactive material, called a tracer, that is absorbed by your body's organs. A large scanning machine detects the radioactive tracer and creates pictures of the areas that your health care provider wants to know more about. There are several kinds of nuclear medicine exams. They include the following:  CT scan.  MRI scan.  PET scan.  SPECT scan. Tell your health care provider about:  Any allergies you have.  All medicines you are taking, including vitamins, herbs, eye drops, creams, and over-the-counter medicines.  Any problems you or family members have had with anesthetic medicines.  Any blood disorders you have.  Any surgeries you have had.  Any medical conditions you have.  Whether you are pregnant, may be pregnant, or are breastfeeding. What are the risks? Generally, this is a safe procedure. However, problems may occur, such as:  An allergic reaction to the tracer. This is rare.  Exposure to radiation (a small amount). What happens before the procedure? Medicines Ask your health care provider about:  Changing or stopping your regular medicines. This is especially important if you are taking diabetes medicines or blood thinners.  Taking medicines such as aspirin and ibuprofen. These medicines can thin your blood. Do not take these medicines unless your health care provider tells you to take them.  Taking over-the-counter medicines, vitamins, herbs, and supplements. General instructions  Follow instructions from your health care provider about eating and drinking restrictions.  Do not wear jewelry.  Wear loose, comfortable clothing. You may be asked to wear a hospital gown for the procedure.  Bring previous imaging studies, such as X-rays, with you to the exam if they are  available. What happens during the procedure?  An IV may be inserted into one of your veins.  You will be asked to lie on a table or sit in a chair.  You will be given the radioactive tracer. You may get: ? A pill or liquid to swallow. ? An injection. ? Medicine through your IV. ? A gas to inhale.  A large scanning machine will be used to create images of your body. After the pictures are taken, you may have to wait until your health care provider can make sure that enough images were taken. The procedure may vary among health care providers and hospitals.   What can I expect after the procedure?  It is up to you to get the results of your procedure. Ask your health care provider, or the department that is doing  the procedure, when your results will be ready. Also ask: ? How will I get my results? ? What are my treatment options? ? What other tests do I need? ? What are my next steps? Follow these instructions at home:  Drink enough water to keep your urine pale yellow (6-8 glasses). This helps to remove the radioactive tracer from your body.  You may return to your normal activities as told by your health care provider. Get help right away if you:  Have problems breathing. This symptom may represent a serious problem that is an emergency. Do not wait to see if the symptoms will go away. Get medical help right away. Call your local emergency services (911 in the [place] Do not drive yourself to the hospital. Summary  A nuclear medicine exam is a safe and painless imaging test. It provides information about how your organs are working. It is also used to detect and diagnose diseases.  During the procedure, you will be given a radioactive tracer. A large scanning machine will create images of your body.  You may resume your normal activities after the procedure. Follow your health care provider's instructions.  Get help right away if you have problems breathing. This information  is not intended to replace advice given to you by your health care provider. Make sure you discuss any questions you have with your health care provider. Document Revised: 08/17/2019 Document Reviewed: 08/17/2019 Elsevier Patient Education  2021 [employer].

## 2020-05-13 ENCOUNTER — Telehealth (HOSPITAL_COMMUNITY): Payer: Self-pay | Admitting: *Deleted

## 2020-05-13 ENCOUNTER — Encounter (HOSPITAL_COMMUNITY): Payer: Self-pay | Admitting: *Deleted

## 2020-05-13 NOTE — Addendum Note (Signed)
Addended by: Jenne Campus on: 05/13/2020 01:21 PM   Modules accepted: Orders

## 2020-05-13 NOTE — Addendum Note (Signed)
Addended by: Jerl Santos R on: 05/13/2020 08:51 AM   Modules accepted: Orders

## 2020-05-13 NOTE — Telephone Encounter (Signed)
Left message on voicemail per DPR in reference to upcoming appointment scheduled on 05/20/20 at 0800 with detailed instructions given per Myocardial Perfusion Study Information Sheet for the test. LM to arrive 15 minutes early, and that it is imperative to arrive on time for appointment to keep from having the test rescheduled. If you need to cancel or reschedule your appointment, please call the office within 24 hours of your appointment. Failure to do so may result in a cancellation of your appointment, and a $50 no show fee. Phone number given for call back for any questions. Mychart letter sent with instructions.Edrees Valent, Ranae Palms

## 2020-05-20 ENCOUNTER — Other Ambulatory Visit: Payer: Self-pay

## 2020-05-20 ENCOUNTER — Ambulatory Visit (HOSPITAL_COMMUNITY): Payer: Medicare PPO | Attending: Internal Medicine

## 2020-05-20 DIAGNOSIS — R079 Chest pain, unspecified: Secondary | ICD-10-CM | POA: Diagnosis not present

## 2020-05-20 LAB — MYOCARDIAL PERFUSION IMAGING
LV dias vol: 86 mL (ref 46–106)
LV sys vol: 42 mL
Peak HR: 88 {beats}/min
Rest HR: 62 {beats}/min
SDS: 2
SRS: 0
SSS: 2
TID: 1.05

## 2020-05-20 MED ORDER — TECHNETIUM TC 99M TETROFOSMIN IV KIT
32.2000 | PACK | Freq: Once | INTRAVENOUS | Status: AC | PRN
  Administered 2020-05-20: 32.2 via INTRAVENOUS
  Filled 2020-05-20: qty 33

## 2020-05-20 MED ORDER — TECHNETIUM TC 99M TETROFOSMIN IV KIT
10.8000 | PACK | Freq: Once | INTRAVENOUS | Status: AC | PRN
  Administered 2020-05-20: 10.8 via INTRAVENOUS
  Filled 2020-05-20: qty 11

## 2020-05-20 MED ORDER — REGADENOSON 0.4 MG/5ML IV SOLN
0.4000 mg | Freq: Once | INTRAVENOUS | Status: AC
  Administered 2020-05-20: 0.4 mg via INTRAVENOUS

## 2020-05-21 ENCOUNTER — Telehealth (INDEPENDENT_AMBULATORY_CARE_PROVIDER_SITE_OTHER): Payer: Medicare PPO | Admitting: Psychiatry

## 2020-05-21 DIAGNOSIS — F411 Generalized anxiety disorder: Secondary | ICD-10-CM | POA: Diagnosis not present

## 2020-05-21 DIAGNOSIS — F1020 Alcohol dependence, uncomplicated: Secondary | ICD-10-CM | POA: Diagnosis not present

## 2020-05-21 DIAGNOSIS — F331 Major depressive disorder, recurrent, moderate: Secondary | ICD-10-CM

## 2020-05-21 DIAGNOSIS — F17200 Nicotine dependence, unspecified, uncomplicated: Secondary | ICD-10-CM | POA: Diagnosis not present

## 2020-05-21 MED ORDER — BUPROPION HCL ER (SR) 150 MG PO TB12: 150.0000 mg | ORAL_TABLET | Freq: Two times a day (BID) | ORAL | 0 refills | Status: DC

## 2020-05-21 MED ORDER — VENLAFAXINE HCL ER 75 MG PO CP24: 225.0000 mg | ORAL_CAPSULE | Freq: Every day | ORAL | 0 refills | Status: DC

## 2020-05-21 MED ORDER — BUSPIRONE HCL 10 MG PO TABS: 10.0000 mg | ORAL_TABLET | Freq: Three times a day (TID) | ORAL | 0 refills | Status: DC

## 2020-05-21 NOTE — Progress Notes (Signed)
Virtual Visit via Telephone Note  I connected with [name] on 05/21/20 at 10:30 AM EST by telephone and verified that I am speaking with the correct person using two identifiers.  Location: Patient: home Provider: office   I discussed the limitations, risks, security and privacy concerns of performing an evaluation and management service by telephone and the availability of in person appointments. I also discussed with the patient that there may be a patient responsible charge related to this service. The patient expressed understanding and agreed to proceed.   History of Present Illness: Sopheap shares that this time is always hard on her. Last year we temporarily increased the Effexor. We decreased the dose after 3 months due to having "muting emotions". About 6 weeks ago she increased the dose on her own. Her depression is stable. She is active and social and is motivated. Her sleep is good. Hilde denies SI/HI. Her anxiety is mild and manageable. She feels that this combination is working and wants to continue the increase in Effexor until about April. In the spring her mood improves.   Observations/Objective:  General Appearance: unable to assess  Eye Contact:  unable to assess  Speech:  Clear and Coherent and Normal Rate  Volume:  Normal  Mood:  Euthymic  Affect:  Full Range  Thought Process:  Goal Directed, Linear and Descriptions of Associations: Intact  Orientation:  Full (Time, Place, and Person)  Thought Content:  Logical  Suicidal Thoughts:  No  Homicidal Thoughts:  No  Memory:  Immediate;   Good  Judgement:  Good  Insight:  Good  Psychomotor Activity: unable to assess  Concentration:  Concentration: Good  Recall:  Good  Fund of Knowledge:  Good  Language:  Good  Akathisia:  unable to assess  Handed:  Right  AIMS (if indicated):     Assets:  Communication Skills Desire for Improvement Financial Resources/Insurance Housing Resilience Social  Support Talents/Skills Transportation Vocational/Educational  ADL's:  unable to assess  Cognition:  WNL  Sleep:         I reviewed the information below on 05/21/20 and have updated it 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 Plan to decrease back to 150mg  at the next visit.    Wellbutrin SR 150mg  po BID   Trazodone 200mg  po qHS   Buspar 10mg  po TID  Discussed use of UV light therapy  Follow Up Instructions: In 2-3 months or sooner if needed   I discussed the assessment and treatment plan with the patient. The patient was provided an opportunity to ask questions and all were answered. The patient agreed with the plan and demonstrated an understanding of the instructions.   The patient was advised to call back or seek an in-person evaluation if the symptoms worsen or if the condition fails to improve as anticipated.  I provided 15 minutes of non-face-to-face time during this encounter.   [name], MD

## 2020-05-22 DIAGNOSIS — E7800 Pure hypercholesterolemia, unspecified: Secondary | ICD-10-CM | POA: Insufficient documentation

## 2020-05-22 DIAGNOSIS — R519 Headache, unspecified: Secondary | ICD-10-CM | POA: Insufficient documentation

## 2020-05-22 DIAGNOSIS — J449 Chronic obstructive pulmonary disease, unspecified: Secondary | ICD-10-CM | POA: Insufficient documentation

## 2020-05-22 DIAGNOSIS — Z860100 Personal history of colon polyps, unspecified: Secondary | ICD-10-CM | POA: Insufficient documentation

## 2020-05-22 DIAGNOSIS — R599 Enlarged lymph nodes, unspecified: Secondary | ICD-10-CM | POA: Insufficient documentation

## 2020-05-22 DIAGNOSIS — E669 Obesity, unspecified: Secondary | ICD-10-CM | POA: Insufficient documentation

## 2020-05-22 DIAGNOSIS — F419 Anxiety disorder, unspecified: Secondary | ICD-10-CM | POA: Insufficient documentation

## 2020-05-22 DIAGNOSIS — F1910 Other psychoactive substance abuse, uncomplicated: Secondary | ICD-10-CM | POA: Insufficient documentation

## 2020-05-22 DIAGNOSIS — M1990 Unspecified osteoarthritis, unspecified site: Secondary | ICD-10-CM | POA: Insufficient documentation

## 2020-05-26 ENCOUNTER — Encounter: Payer: Self-pay | Admitting: Cardiology

## 2020-05-26 ENCOUNTER — Ambulatory Visit (INDEPENDENT_AMBULATORY_CARE_PROVIDER_SITE_OTHER): Payer: Medicare PPO | Admitting: Cardiology

## 2020-05-26 ENCOUNTER — Other Ambulatory Visit: Payer: Self-pay

## 2020-05-26 VITALS — BP 120/62 | HR 65 | Ht 64.0 in | Wt 165.0 lb

## 2020-05-26 DIAGNOSIS — I2510 Atherosclerotic heart disease of native coronary artery without angina pectoris: Secondary | ICD-10-CM | POA: Diagnosis not present

## 2020-05-26 DIAGNOSIS — F17200 Nicotine dependence, unspecified, uncomplicated: Secondary | ICD-10-CM | POA: Diagnosis not present

## 2020-05-26 DIAGNOSIS — E7800 Pure hypercholesterolemia, unspecified: Secondary | ICD-10-CM | POA: Diagnosis not present

## 2020-05-26 DIAGNOSIS — I10 Essential (primary) hypertension: Secondary | ICD-10-CM

## 2020-05-26 MED ORDER — RANOLAZINE ER 500 MG PO TB12: 500.0000 mg | ORAL_TABLET | Freq: Two times a day (BID) | ORAL | 2 refills | Status: DC

## 2020-05-26 MED ORDER — ASPIRIN EC 81 MG PO TBEC: 81.0000 mg | DELAYED_RELEASE_TABLET | Freq: Every day | ORAL | 3 refills | Status: DC

## 2020-05-26 NOTE — Patient Instructions (Signed)
Medication Instructions:  Your physician has recommended you make the following change in your medication:   START: Ranexa 500 mg twice daily  START: Aspirin 81 mg daily   *If you need a refill on your cardiac medications before your next appointment, please call your pharmacy*   Lab Work: None If you have labs (blood work) drawn today and your tests are completely normal, you will receive your results only by: [name] MyChart Message (if you have MyChart) OR . A paper copy in the mail If you have any lab test that is abnormal or we need to change your treatment, we will call you to review the results.   Testing/Procedures: None   Follow-Up: At [facility], you and your health needs are our priority.  As part of our continuing mission to provide you with exceptional heart care, we have created designated Provider Care Teams.  These Care Teams include your primary Cardiologist (physician) and Advanced Practice Providers (APPs -  Physician Assistants and Nurse Practitioners) who all work together to provide you with the care you need, when you need it.  We recommend signing up for the patient portal called "MyChart".  Sign up information is provided on this After Visit Summary.  MyChart is used to connect with patients for Virtual Visits (Telemedicine).  Patients are able to view lab/test results, encounter notes, upcoming appointments, etc.  Non-urgent messages can be sent to your provider as well.   To learn more about what you can do with MyChart, go to [email or web address].    Your next appointment:   6 week(s)  The format for your next appointment:   In Person  Provider:   Jenne Campus, MD   Other Instructions

## 2020-05-26 NOTE — Progress Notes (Signed)
Cardiology Office Note:    Date:  05/26/2020   ID:  [name], DOB July 04, 1945, MRN [id number]  PCP:  Ma Hillock, DO  Cardiologist:  Jenne Campus, MD    Referring MD: Ma Hillock, DO   Chief Complaint  Patient presents with  . Results  I am doing fine, and here to discuss results of my tests  History of Present Illness:    Kendra Ball is a 75 y.o. female with past medical history significant for COPD, chronic smoker which is still ongoing, essential hypertension, dyslipidemia.  She was referred to [place] because CT of her chest done showed some calcification of the coronary artery.  She did not have much symptoms.  She try to exercise on the regular basis, no difficulty doing it.  I decided to do stress test to make sure she does not have any inducible ischemia, stress test showed possibility of all apical anterior ulnae with possibility of peri-infarct ischemia overall is a very small area and overall her left ventricle ejection fraction still reasonably maintained.  She again denies having the symptoms there is no chest pain tightness squeezing pressure burning chest.  She does complain of having fatigue and shortness of breath but no typical cardiac symptoms otherwise.  Past Medical History:  Diagnosis Date  . Abnormal mammogram 10/01/2013   Diagnostic [facility] & [place] confirm cyst & duct ectasia of L breast. 08/2013.   [name] Abnormal weight gain 05/05/2020  . Adenopathy    right supraclavicular  . Anxiety   . Aortic atherosclerosis ([place]) 05/05/2020  . Arthritis   . At high risk for falls 01/29/2019  . Carotid bruit 05/08/2020  . Chronic diarrhea 10/13/2015  . COPD (chronic obstructive pulmonary disease) ([place])   . COPD (chronic obstructive pulmonary disease) with chronic bronchitis ([place]) 02/13/2015  . Coronary atherosclerosis 10/22/2015  . DEPRESSION 08/26/2008  . 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 Dyslipidemia 10/03/2013   31% reduction LDL after 6 mos of  Simvastatin 10 mg qd. RF: smoker, fam Hx, sedentary HDL is high!!   . Dyspnea on exertion 05/08/2020  . Essential hypertension 05/22/2014  . Family history of malignant neoplasm of gastrointestinal tract 05/05/2020  . Family history of premature coronary heart disease 08/13/2013   Father, brother, sister Personal risk factors: smoker, female over 46, fam Hx, sedentary, thyroid disease, dyslipidemia, BMI over 25.   [name] GAD (generalized anxiety disorder) 01/30/2014  . Gait abnormality 08/06/2019  . H/O malignant lymphoma 05/23/2019  . Headache   . History of colon polyps 10/13/2015  . History of gastric bypass 09/10/2014  . Hx of colonic polyps    First noted on  colonoscopy 2012  . Hypercholesteremia   . HYPERTENSION 08/26/2008  . HYPOTHYROIDISM 08/26/2008  . Hypothyroidism 08/26/2008   Current meds: levothyroxine 100 mcg qd.    . Insomnia 05/05/2015  . Iron deficiency anemia 05/05/2020  . Lymphoma ([place]) 2019   B-cell  . MDD (major depressive disorder), recurrent episode, moderate ([place]) 01/30/2014   [facility] 05/2015 08/18/2015:  Effexor XR 225mg  po qD for depression and anxiety Wellbutrin SR to 200mg  po BID for depression  Xanax 0.5mg  po BID and 1mg  po qHS prn anxiety     Routine PRN Medications: Yes  Consultations: encouraged to f/up with PCP as needed, encouraged to start individual therapy  Safety Concerns: Pt denies SI and is at an acute low risk for suicide.Patient told to call clinic i  . Multiple fractures 01/29/2019  .  Obesity   . Osteoporosis   . Port-A-Cath in place 12/27/2017  . Pulmonary nodule seen on imaging study 02/13/2015  . Smoker 09/14/2015  . Substance abuse [facility])    inpatient and outpatient tx for substance abuse  . TOBACCO USER 12/25/2009  . Tremor 05/23/2019    Past Surgical History:  Procedure Laterality Date  . AXILLARY LYMPH NODE BIOPSY Right 06/22/2017   Procedure: RIGHT AXILLARY LYMPH NODE BIOPSY;  Surgeon: [name], MD;  Location: [facility];  Service: General;  Laterality:  Right;  . [place] SURGERY  2006  . CESAREAN SECTION    . COLON RESECTION N/A 05/09/2014   Procedure: LAPAROSCOPIC HAND-ASSISTED EXTENDED RIGHT COLECTOMY, LAPAROSCOPIC LYSIS OF ADHESIONS, SPLENIC FLEXURE MOBILIZATION.;  Surgeon: [name], MD;  Location: [facility];  Service: General;  Laterality: N/A;  . DILATION AND CURETTAGE OF UTERUS     x2  . FOREARM FRACTURE SURGERY     right  . GASTRIC BYPASS  2006  . IR IMAGING GUIDED PORT INSERTION  12/01/2017  . IR REMOVAL TUN ACCESS W/ PORT W/O FL MOD SED  07/03/2018  . MASS EXCISION Right 02/24/2017   Procedure: EXCISIONAL BIOPSY RIGHT SUPRA-CLAVICULAR NODE;  Surgeon: [name], MD;  Location: [facility];  Service: ENT;  Laterality: Right;  . TONSILLECTOMY      Current Medications: Current Meds  Medication Sig  . albuterol (VENTOLIN HFA) 108 (90 Base) MCG/ACT inhaler Inhale 2 puffs into the lungs every 6 (six) hours as needed for wheezing or shortness of breath.  [name] alendronate (FOSAMAX) 70 MG tablet Take 1 tablet (70 mg total) by mouth every 7 (seven) days. Take with a full glass of water on an empty stomach.  [name] buPROPion (WELLBUTRIN SR) 150 MG 12 hr tablet Take 1 tablet (150 mg total) by mouth 2 (two) times daily.  . busPIRone (BUSPAR) 10 MG tablet Take 1 tablet (10 mg total) by mouth 3 (three) times daily.  . Cyanocobalamin 2500 MCG SUBL 1 tab sublingual daily  . ferrous fumarate (HEMOCYTE - 106 MG FE) 325 (106 FE) MG TABS Take 1 tablet by mouth daily. Reported on 10/06/2015  . levothyroxine (SYNTHROID) 100 MCG tablet Take 1 tablet (100 mcg total) by mouth daily before breakfast.  . Magnesium 250 MG TABS Take 250 mg by mouth daily.   . rosuvastatin (CRESTOR) 10 MG tablet Take 1 tablet (10 mg total) by mouth daily.  . traZODone (DESYREL) 100 MG tablet Take 2 tablets (200 mg total) by mouth at bedtime.  [name] venlafaxine XR (EFFEXOR-XR) 75 MG 24 hr capsule Take 3 capsules (225 mg total) by mouth daily with breakfast.      Allergies:   Morphine sulfate   Social History   Socioeconomic History  . Marital status: Married    Spouse name: Not on file  . Number of children: 3  . Years of education: Not on file  . Highest education level: Not on file  Occupational History  . Occupation: RETIRED    Employer: [employer]  Tobacco Use  . Smoking status: Current Every Day Smoker    Packs/day: 1.00    Years: 50.00    Pack years: 50.00    Types: Cigarettes  . Smokeless tobacco: Never Used  Vaping Use  . Vaping Use: Never used  Substance and Sexual Activity  . Alcohol use: No    Comment: quit on Aug 27, 2015  . Drug use: No    Frequency: 20.0 times per week  Comment: last used May 2017  . Sexual activity: Not Currently    Partners: Male    Birth control/protection: Post-menopausal  Other Topics Concern  . Not on file  Social History Narrative   Ms. Milks lives in [place] with her husband. She has 3 grown children and several grand children. She has joint custody of 3 of her grand children as her daughter has bipolar disorder and has needed assistance with children in past.   1-2 cups coffee, 3-6 cups tea per day.   Right-handed.   Social Determinants of Health   Financial Resource Strain: Not on file  Food Insecurity: Not on file  Transportation Needs: Not on file  Physical Activity: Not on file  Stress: Not on file  Social Connections: Not on file     Family History: The patient's family history includes Alcohol abuse in her maternal uncle; Alzheimer's disease in her mother; Bipolar disorder in her daughter; Cancer in her mother; Colon cancer in her maternal aunt; Dementia in her paternal aunt; Depression in her brother, mother, sister, and sister; Heart disease (age of onset: 58) in her father; Heart disease (age of onset: 4) in her sister; Heart disease (age of onset: 12) in her brother; Mental illness in her daughter; Obesity in her daughter, daughter, and son; Stomach cancer  in her maternal grandfather; Suicidality in her daughter. ROS:   Please see the history of present illness.    All 14 point review of systems negative except as described per history of present illness  EKGs/Labs/Other Studies Reviewed:      Recent Labs: 03/24/2020: ALT 13; BUN 8; Creatinine, Ser 0.73; Hemoglobin 14.8; Platelets 236.0; Potassium 4.6; Sodium 141; TSH 1.31  Recent Lipid Panel    Component Value Date/Time   CHOL 168 03/24/2020 1014   TRIG 86.0 03/24/2020 1014   HDL 89.90 03/24/2020 1014   CHOLHDL 2 03/24/2020 1014   VLDL 17.2 03/24/2020 1014   LDLCALC 61 03/24/2020 1014   LDLDIRECT 133.5 08/09/2012 1036    Physical Exam:    VS:  BP 120/62 (BP Location: Right Arm, Patient Position: Sitting)   Pulse 65   Ht 5\' 4"  (1.626 m)   Wt 165 lb (74.8 kg)   SpO2 96%   BMI 28.32 kg/m     Wt Readings from Last 3 Encounters:  05/26/20 165 lb (74.8 kg)  05/20/20 162 lb (73.5 kg)  05/08/20 162 lb (73.5 kg)     GEN:  Well nourished, well developed in no acute distress HEENT: Normal NECK: No JVD; No carotid bruits LYMPHATICS: No lymphadenopathy CARDIAC: RRR, no murmurs, no rubs, no gallops RESPIRATORY:  Clear to auscultation without rales, wheezing or rhonchi  ABDOMEN: Soft, non-tender, non-distended MUSCULOSKELETAL:  No edema; No deformity  SKIN: Warm and dry LOWER EXTREMITIES: no swelling NEUROLOGIC:  Alert and oriented x 3 PSYCHIATRIC:  Normal affect   ASSESSMENT:    1. Essential hypertension   2. Atherosclerosis of native coronary artery of native heart without angina pectoris   3. Hypercholesteremia   4. Smoker    PLAN:    In order of problems listed above:  1. Abnormal stress test.  Showed possibility of ischemia involving the peri-infarct area involving apex.  We had a long discussion about what to do with the situation.  Since he does not have much symptoms we decided to treat this with medications.  She will start baby aspirin every single day, I will  also put her on ranolazine 5 mg twice daily.  We did talk also about possibility of doing cardiac catheterization she is not keen to that.  She said I am asymptomatic doing well and now since I discovered to have a problem by CT of my chest and watching myself more carefully and I do not see any issues.  I told her if she still having chest pain tightness squeezing pressure burning chest she is to let me know and if she decided she would like to have a cardiac catheterization we can pursue it. 2. Essential hypertension: Blood pressure seems to be well controlled continue present management. 3. Dyslipidemia she is taking Crestor 10 which I will continue last fasting lipid profile have is from March 24, 2020 which showing LDL of 61 HDL 89 needs a good cholesterol profile. 4. Smoking obviously big problem is been great deal of time talking about it so strongly recommend to quit she already use nicotine patch she said that this difficult but she is working on it. 5. She is scheduled to have an echocardiogram which will confirm possibility of an apical MI which will be important in decision making process.  She is also scheduled to have carotic ultrasounds will wait for it.   Medication Adjustments/Labs and Tests Ordered: Current medicines are reviewed at length with the patient today.  Concerns regarding medicines are outlined above.  No orders of the defined types were placed in this encounter.  Medication changes: No orders of the defined types were placed in this encounter.   Signed, Park Liter, MD, [facility] 05/26/2020 11:31 AM    [place]

## 2020-05-27 ENCOUNTER — Encounter: Payer: Self-pay | Admitting: Family Medicine

## 2020-05-27 DIAGNOSIS — R9439 Abnormal result of other cardiovascular function study: Secondary | ICD-10-CM

## 2020-05-27 HISTORY — DX: Abnormal result of other cardiovascular function study: R94.39

## 2020-06-03 ENCOUNTER — Ambulatory Visit (HOSPITAL_BASED_OUTPATIENT_CLINIC_OR_DEPARTMENT_OTHER)
Admission: RE | Admit: 2020-06-03 | Discharge: 2020-06-03 | Disposition: A | Discharge status: Home / Self Care | Payer: Medicare PPO | Source: Ambulatory Visit | Attending: Cardiology | Admitting: Cardiology

## 2020-06-03 ENCOUNTER — Other Ambulatory Visit: Payer: Self-pay

## 2020-06-03 DIAGNOSIS — R0989 Other specified symptoms and signs involving the circulatory and respiratory systems: Secondary | ICD-10-CM | POA: Diagnosis not present

## 2020-06-03 DIAGNOSIS — R0600 Dyspnea, unspecified: Secondary | ICD-10-CM | POA: Diagnosis not present

## 2020-06-03 DIAGNOSIS — J449 Chronic obstructive pulmonary disease, unspecified: Secondary | ICD-10-CM | POA: Diagnosis not present

## 2020-06-03 DIAGNOSIS — R079 Chest pain, unspecified: Secondary | ICD-10-CM

## 2020-06-03 DIAGNOSIS — J4489 Other specified chronic obstructive pulmonary disease: Secondary | ICD-10-CM

## 2020-06-03 DIAGNOSIS — F17200 Nicotine dependence, unspecified, uncomplicated: Secondary | ICD-10-CM

## 2020-06-03 DIAGNOSIS — I2510 Atherosclerotic heart disease of native coronary artery without angina pectoris: Secondary | ICD-10-CM | POA: Diagnosis not present

## 2020-06-03 DIAGNOSIS — R0609 Other forms of dyspnea: Secondary | ICD-10-CM

## 2020-06-03 DIAGNOSIS — I10 Essential (primary) hypertension: Secondary | ICD-10-CM | POA: Diagnosis not present

## 2020-06-04 LAB — ECHOCARDIOGRAM COMPLETE
Area-P 1/2: 2.96 cm2
MV M vel: 5.18 m/s
MV Peak grad: 107.3 mmHg

## 2020-06-11 ENCOUNTER — Telehealth: Payer: Self-pay | Admitting: Cardiology

## 2020-06-11 NOTE — Telephone Encounter (Signed)
Patient is returning call to discuss echo results. °

## 2020-06-11 NOTE — Telephone Encounter (Signed)
Patient calling to receive her test results

## 2020-06-11 NOTE — Telephone Encounter (Signed)
Patient informed of results.  

## 2020-06-11 NOTE — Telephone Encounter (Signed)
Left message for patient to return call.

## 2020-06-11 NOTE — Telephone Encounter (Signed)
See additional call.

## 2020-06-23 ENCOUNTER — Other Ambulatory Visit: Payer: Self-pay

## 2020-07-01 ENCOUNTER — Encounter: Payer: Self-pay | Admitting: Cardiology

## 2020-07-01 ENCOUNTER — Other Ambulatory Visit: Payer: Self-pay

## 2020-07-01 ENCOUNTER — Ambulatory Visit (INDEPENDENT_AMBULATORY_CARE_PROVIDER_SITE_OTHER): Payer: Medicare PPO | Admitting: Cardiology

## 2020-07-01 VITALS — BP 98/62 | HR 56 | Ht 64.0 in | Wt 169.0 lb

## 2020-07-01 DIAGNOSIS — I739 Peripheral vascular disease, unspecified: Secondary | ICD-10-CM | POA: Insufficient documentation

## 2020-07-01 DIAGNOSIS — I10 Essential (primary) hypertension: Secondary | ICD-10-CM

## 2020-07-01 DIAGNOSIS — E785 Hyperlipidemia, unspecified: Secondary | ICD-10-CM

## 2020-07-01 DIAGNOSIS — F17200 Nicotine dependence, unspecified, uncomplicated: Secondary | ICD-10-CM

## 2020-07-01 DIAGNOSIS — I2510 Atherosclerotic heart disease of native coronary artery without angina pectoris: Secondary | ICD-10-CM

## 2020-07-01 NOTE — Patient Instructions (Signed)

## 2020-07-01 NOTE — Progress Notes (Signed)
Cardiology Office Note:    Date:  07/01/2020   ID:  Kendra Ball, DOB December 29, 1945, MRN [id number]  PCP:  Ma Hillock, DO  Cardiologist:  Jenne Campus, MD    Referring MD: Ma Hillock, DO   Chief Complaint  Patient presents with  . Follow-up  M doing fine  History of Present Illness:    Kendra Ball is a 75 y.o. female who was sent to [place] initially because of CT of her chest showing some calcification of the coronary arteries.  After that we did stress test which showed some possibility of apical MI with some peri-infarct ischemia.  Then she had echocardiogram done to check left ventricle ejection fraction, it did show relatively preserved left ventricle ejection fraction there was hypokinesis however involving inferior wall.  Involving basal to midportion of the inferior wall.  There was some inconsistency between those 2 tests.  Also had carotic ultrasound done which showed evidence of peripheral vascular disease with bilateral carotic arterial stenosis 1 side up to 80% in outside up to 60%. She comes today 2 months of follow-up.  Overall she is doing well.  Denies have any chest pain tightness squeezing pressure burning chest.  She can walk climb stairs with no difficulties except for shortness of breath.  Last week and she went to [facility] with her family and she was able to walk all day with no problems.  Past Medical History:  Diagnosis Date  . Abnormal mammogram 10/01/2013   Diagnostic [facility] & [place] confirm cyst & duct ectasia of L breast. 08/2013.   [name] Abnormal stress test 05/27/2020  . Abnormal weight gain 05/05/2020  . Adenopathy    right supraclavicular  . Anxiety   . Aortic atherosclerosis ([place]) 05/05/2020  . Arthritis   . At high risk for falls 01/29/2019  . Carotid bruit 05/08/2020  . Chronic diarrhea 10/13/2015  . COPD (chronic obstructive pulmonary disease) ([place])   . COPD (chronic obstructive pulmonary disease) with chronic bronchitis ([place]) 02/13/2015  . Coronary  atherosclerosis 10/22/2015  . DEPRESSION 08/26/2008  . 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 Dyslipidemia 10/03/2013   31% reduction LDL after 6 mos of Simvastatin 10 mg qd. RF: smoker, fam Hx, sedentary HDL is high!!   . Dyspnea on exertion 05/08/2020  . Essential hypertension 05/22/2014  . Family history of malignant neoplasm of gastrointestinal tract 05/05/2020  . Family history of premature coronary heart disease 08/13/2013   Father, brother, sister Personal risk factors: smoker, female over 64, fam Hx, sedentary, thyroid disease, dyslipidemia, BMI over 25.   [name] GAD (generalized anxiety disorder) 01/30/2014  . Gait abnormality 08/06/2019  . H/O malignant lymphoma 05/23/2019  . Headache   . History of colon polyps 10/13/2015  . History of gastric bypass 09/10/2014  . Hx of colonic polyps    First noted on  colonoscopy 2012  . Hypercholesteremia   . HYPERTENSION 08/26/2008  . HYPOTHYROIDISM 08/26/2008  . Hypothyroidism 08/26/2008   Current meds: levothyroxine 100 mcg qd.    . Insomnia 05/05/2015  . Iron deficiency anemia 05/05/2020  . Lymphoma ([place]) 2019   B-cell  . MDD (major depressive disorder), recurrent episode, moderate ([place]) 01/30/2014   [facility] 05/2015 08/18/2015:  Effexor XR 225mg  po qD for depression and anxiety Wellbutrin SR to 200mg  po BID for depression  Xanax 0.5mg  po BID and 1mg  po qHS prn anxiety     Routine PRN Medications: Yes  Consultations: encouraged to f/up with PCP as  needed, encouraged to start individual therapy  Safety Concerns: Pt denies SI and is at an acute low risk for suicide.Patient told to call clinic i  . Multiple fractures 01/29/2019  . Obesity   . Osteoporosis   . Paresthesia 08/06/2019  . Port-A-Cath in place 12/27/2017  . Pulmonary nodule seen on imaging study 02/13/2015  . Smoker 09/14/2015  . Substance abuse [facility])    inpatient and outpatient tx for substance abuse  . TOBACCO USER 12/25/2009  . Tremor 05/23/2019    Past Surgical History:  Procedure  Laterality Date  . AXILLARY LYMPH NODE BIOPSY Right 06/22/2017   Procedure: RIGHT AXILLARY LYMPH NODE BIOPSY;  Surgeon: [name], MD;  Location: [facility];  Service: General;  Laterality: Right;  . [place] SURGERY  2006  . CESAREAN SECTION    . COLON RESECTION N/A 05/09/2014   Procedure: LAPAROSCOPIC HAND-ASSISTED EXTENDED RIGHT COLECTOMY, LAPAROSCOPIC LYSIS OF ADHESIONS, SPLENIC FLEXURE MOBILIZATION.;  Surgeon: [name], MD;  Location: [facility];  Service: General;  Laterality: N/A;  . DILATION AND CURETTAGE OF UTERUS     x2  . FOREARM FRACTURE SURGERY     right  . GASTRIC BYPASS  2006  . IR IMAGING GUIDED PORT INSERTION  12/01/2017  . IR REMOVAL TUN ACCESS W/ PORT W/O FL MOD SED  07/03/2018  . MASS EXCISION Right 02/24/2017   Procedure: EXCISIONAL BIOPSY RIGHT SUPRA-CLAVICULAR NODE;  Surgeon: [name], MD;  Location: [place];  Service: ENT;  Laterality: Right;  . TONSILLECTOMY      Current Medications: Current Meds  Medication Sig  . albuterol (VENTOLIN HFA) 108 (90 Base) MCG/ACT inhaler Inhale 2 puffs into the lungs every 6 (six) hours as needed for wheezing or shortness of breath.  [name] alendronate (FOSAMAX) 70 MG tablet Take 1 tablet (70 mg total) by mouth every 7 (seven) days. Take with a full glass of water on an empty stomach.  [name] aspirin EC 81 MG tablet Take 1 tablet (81 mg total) by mouth daily. Swallow whole.  [name] buPROPion (WELLBUTRIN SR) 150 MG 12 hr tablet Take 1 tablet (150 mg total) by mouth 2 (two) times daily.  . busPIRone (BUSPAR) 10 MG tablet Take 1 tablet (10 mg total) by mouth 3 (three) times daily.  . Cyanocobalamin 2500 MCG SUBL 1 tab sublingual daily  . ferrous fumarate (HEMOCYTE - 106 MG FE) 325 (106 FE) MG TABS Take 1 tablet by mouth daily. Reported on 10/06/2015  . levothyroxine (SYNTHROID) 100 MCG tablet Take 1 tablet (100 mcg total) by mouth daily before breakfast.  . Magnesium 250 MG TABS Take 250 mg by mouth daily.   . ranolazine  (RANEXA) 500 MG 12 hr tablet Take 1 tablet (500 mg total) by mouth 2 (two) times daily.  . rosuvastatin (CRESTOR) 10 MG tablet Take 1 tablet (10 mg total) by mouth daily.  . traZODone (DESYREL) 100 MG tablet Take 2 tablets (200 mg total) by mouth at bedtime.  [name] venlafaxine XR (EFFEXOR-XR) 75 MG 24 hr capsule Take 3 capsules (225 mg total) by mouth daily with breakfast.     Allergies:   Morphine sulfate   Social History   Socioeconomic History  . Marital status: Married    Spouse name: Not on file  . Number of children: 3  . Years of education: Not on file  . Highest education level: Not on file  Occupational History  . Occupation: RETIRED    Employer: [employer]  Tobacco Use  .  Smoking status: Current Every Day Smoker    Packs/day: 1.00    Years: 50.00    Pack years: 50.00    Types: Cigarettes  . Smokeless tobacco: Never Used  Vaping Use  . Vaping Use: Never used  Substance and Sexual Activity  . Alcohol use: No    Comment: quit on Aug 27, 2015  . Drug use: No    Frequency: 20.0 times per week    Comment: last used May 2017  . Sexual activity: Not Currently    Partners: Male    Birth control/protection: Post-menopausal  Other Topics Concern  . Not on file  Social History Narrative   Ms. Burdell lives in [place] with her husband. She has 3 grown children and several grand children. She has joint custody of 3 of her grand children as her daughter has bipolar disorder and has needed assistance with children in past.   1-2 cups coffee, 3-6 cups tea per day.   Right-handed.   Social Determinants of Health   Financial Resource Strain: Not on file  Food Insecurity: Not on file  Transportation Needs: Not on file  Physical Activity: Not on file  Stress: Not on file  Social Connections: Not on file     Family History: The patient's family history includes Alcohol abuse in her maternal uncle; Alzheimer's disease in her mother; Bipolar disorder in her daughter;  Cancer in her mother; Colon cancer in her maternal aunt; Dementia in her paternal aunt; Depression in her brother, mother, sister, and sister; Heart disease (age of onset: 44) in her father; Heart disease (age of onset: 66) in her sister; Heart disease (age of onset: 68) in her brother; Mental illness in her daughter; Obesity in her daughter, daughter, and son; Stomach cancer in her maternal grandfather; Suicidality in her daughter. ROS:   Please see the history of present illness.    All 14 point review of systems negative except as described per history of present illness  EKGs/Labs/Other Studies Reviewed:      Recent Labs: 03/24/2020: ALT 13; BUN 8; Creatinine, Ser 0.73; Hemoglobin 14.8; Platelets 236.0; Potassium 4.6; Sodium 141; TSH 1.31  Recent Lipid Panel    Component Value Date/Time   CHOL 168 03/24/2020 1014   TRIG 86.0 03/24/2020 1014   HDL 89.90 03/24/2020 1014   CHOLHDL 2 03/24/2020 1014   VLDL 17.2 03/24/2020 1014   LDLCALC 61 03/24/2020 1014   LDLDIRECT 133.5 08/09/2012 1036    Physical Exam:    VS:  BP 98/62 (BP Location: Right Arm, Patient Position: Sitting)   Pulse (!) 56   Ht 5\' 4"  (1.626 m)   Wt 169 lb (76.7 kg)   SpO2 94%   BMI 29.01 kg/m     Wt Readings from Last 3 Encounters:  07/01/20 169 lb (76.7 kg)  05/26/20 165 lb (74.8 kg)  05/20/20 162 lb (73.5 kg)     GEN:  Well nourished, well developed in no acute distress HEENT: Normal NECK: No JVD; No carotid bruits LYMPHATICS: No lymphadenopathy CARDIAC: RRR, no murmurs, no rubs, no gallops RESPIRATORY:  Clear to auscultation without rales, wheezing or rhonchi  ABDOMEN: Soft, non-tender, non-distended MUSCULOSKELETAL:  No edema; No deformity  SKIN: Warm and dry LOWER EXTREMITIES: no swelling NEUROLOGIC:  Alert and oriented x 3 PSYCHIATRIC:  Normal affect   ASSESSMENT:    1. Atherosclerosis of native coronary artery of native heart without angina pectoris   2. Essential hypertension   3.  TOBACCO USER  4. Dyslipidemia   5. Peripheral vascular disease, unspecified ([place]) bilateral carotic arterial disease    PLAN:    In order of problems listed above:  1. Coronary disease.  Asymptomatic in spite of the fact she does have small area of ischemia on his stress test.  We decided to continue medical therapy.  She is on ranolazine as well as antiplatelet therapy and statin. 2. Peripheral vascular disease with up to 80% stenosis on carotid arteries.  We did talk about risk factors modifications.  We will repeat the test in 6 months, will continue antiplatelets therapy as well as statin. 3. Dyslipidemia she is taking statin in form of Crestor 10 mg which I will continue I did review her last fasting lipid profile showing LDL 61 and HDL 89 we will continue present management. 4. Smoking obviously huge problem we spent at least 5 minutes talking about this and I strongly recommend to quit.  She already cut down to only half a 6 pack/day she is using nicotine patch and seems to be doing well and making progress of course I told her that optimal goal is completely discontinue smoking.   Medication Adjustments/Labs and Tests Ordered: Current medicines are reviewed at length with the patient today.  Concerns regarding medicines are outlined above.  No orders of the defined types were placed in this encounter.  Medication changes: No orders of the defined types were placed in this encounter.   Signed, Park Liter, MD, [facility] 07/01/2020 [place]

## 2020-07-03 ENCOUNTER — Other Ambulatory Visit: Payer: Self-pay | Admitting: Cardiology

## 2020-07-03 ENCOUNTER — Other Ambulatory Visit (HOSPITAL_COMMUNITY): Payer: Self-pay | Admitting: Psychiatry

## 2020-07-03 DIAGNOSIS — F1020 Alcohol dependence, uncomplicated: Secondary | ICD-10-CM

## 2020-07-03 DIAGNOSIS — F5105 Insomnia due to other mental disorder: Secondary | ICD-10-CM

## 2020-07-03 DIAGNOSIS — F331 Major depressive disorder, recurrent, moderate: Secondary | ICD-10-CM

## 2020-07-03 NOTE — Telephone Encounter (Signed)
Ranexa and Crestor has already been fill on 05/2020 with additional refill. Pharmacy notified.

## 2020-07-11 ENCOUNTER — Other Ambulatory Visit (HOSPITAL_COMMUNITY): Payer: Self-pay | Admitting: Psychiatry

## 2020-07-11 DIAGNOSIS — F331 Major depressive disorder, recurrent, moderate: Secondary | ICD-10-CM

## 2020-07-11 DIAGNOSIS — F411 Generalized anxiety disorder: Secondary | ICD-10-CM

## 2020-07-20 ENCOUNTER — Telehealth: Payer: Self-pay | Admitting: Oncology

## 2020-07-20 NOTE — Telephone Encounter (Signed)
Calendar & letter has been mailed to the patient with updated address for appointments at [facility]  

## 2020-08-13 ENCOUNTER — Telehealth (INDEPENDENT_AMBULATORY_CARE_PROVIDER_SITE_OTHER): Payer: Medicare PPO | Admitting: Psychiatry

## 2020-08-13 ENCOUNTER — Other Ambulatory Visit: Payer: Self-pay

## 2020-08-13 DIAGNOSIS — F5105 Insomnia due to other mental disorder: Secondary | ICD-10-CM | POA: Diagnosis not present

## 2020-08-13 DIAGNOSIS — F1020 Alcohol dependence, uncomplicated: Secondary | ICD-10-CM | POA: Diagnosis not present

## 2020-08-13 DIAGNOSIS — F99 Mental disorder, not otherwise specified: Secondary | ICD-10-CM

## 2020-08-13 DIAGNOSIS — F17200 Nicotine dependence, unspecified, uncomplicated: Secondary | ICD-10-CM

## 2020-08-13 DIAGNOSIS — F331 Major depressive disorder, recurrent, moderate: Secondary | ICD-10-CM

## 2020-08-13 DIAGNOSIS — F411 Generalized anxiety disorder: Secondary | ICD-10-CM

## 2020-08-13 MED ORDER — BUPROPION HCL ER (SR) 150 MG PO TB12: 150.0000 mg | ORAL_TABLET | Freq: Two times a day (BID) | ORAL | 0 refills | Status: DC

## 2020-08-13 MED ORDER — VENLAFAXINE HCL ER 150 MG PO CP24: 150.0000 mg | ORAL_CAPSULE | Freq: Every day | ORAL | 0 refills | Status: DC

## 2020-08-13 MED ORDER — BUSPIRONE HCL 10 MG PO TABS: 10.0000 mg | ORAL_TABLET | Freq: Three times a day (TID) | ORAL | 0 refills | Status: DC

## 2020-08-13 MED ORDER — TRAZODONE HCL 100 MG PO TABS: 200.0000 mg | ORAL_TABLET | Freq: Every day | ORAL | 0 refills | Status: DC

## 2020-08-13 NOTE — Progress Notes (Signed)
Virtual Visit via Telephone Note  I connected with [name] on 08/13/20 at 10:30 AM EDT by telephone and verified that I am speaking with the correct person using two identifiers. Jennilee did not have her laptop open to log onto [place] but agreed to have it ready for a video chat next appointment.   Location: Patient: home Provider: office   I discussed the limitations, risks, security and privacy concerns of performing an evaluation and management service by telephone and the availability of in person appointments. I also discussed with the patient that there may be a patient responsible charge related to this service. The patient expressed understanding and agreed to proceed.   History of Present Illness: Monya is doing well. She is active and social. She denies any sad mood and anhedonia. She denies SI/HI. Favour has some on/off anxiety but it is manageable. Her sleep is good. She is no longer feeling numb. She wants to decrease the dose of Effexor because she wants to only take what she needs. She believes the winter contributed to her mood. Skyllar denies any cravings or relapse in alcohol use.    Observations/Objective:  General Appearance: unable to assess  Eye Contact:  unable to assess  Speech:  Clear and Coherent and Normal Rate  Volume:  Normal  Mood:  Euthymic  Affect:  Full Range  Thought Process:  Goal Directed, Linear and Descriptions of Associations: Intact  Orientation:  Full (Time, Place, and Person)  Thought Content:  Logical  Suicidal Thoughts:  No  Homicidal Thoughts:  No  Memory:  Immediate;   Good  Judgement:  Good  Insight:  Good  Psychomotor Activity: unable to assess  Concentration:  Concentration: Good  Recall:  Good  Fund of Knowledge:  Good  Language:  Good  Akathisia:  unable to assess  Handed:  Right  AIMS (if indicated):     Assets:  Communication Skills Desire for Improvement Financial  Resources/Insurance Housing Resilience Social Support Talents/Skills Transportation Vocational/Educational  ADL's:  unable to assess  Cognition:  WNL  Sleep:        I reviewed the information below on 08/13/20 and have updated it.  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, sustained remission   Decrease Effexor XR 150mg  po qD   Wellbutrin SR 150mg  po BID   Trazodone 200mg  po qHS   Buspar 10mg  po TID  Depression screen [facility] 2/9 08/13/2020 03/24/2020 04/10/2018 02/16/2017 12/12/2016  Decreased Interest 0 0 0 0 2  Down, Depressed, Hopeless 0 0 0 0 0  PHQ - 2 Score 0 0 0 0 2  Altered sleeping - 0 0 - 0  Tired, decreased energy - 0 2 - 3  Change in appetite - 0 2 - 0  Feeling bad or failure about yourself  - 0 0 - 1  Trouble concentrating - 0 2 - 0  Moving slowly or fidgety/restless - 0 0 - 0  Suicidal thoughts - 0 - - 0  PHQ-9 Score - 0 6 - 6  Difficult doing work/chores - - Not difficult at all - Not difficult at all  Some recent data might be hidden    Flowsheet Row Video Visit from 08/13/2020 in [facility]  C-SSRS RISK CATEGORY No Risk       Follow Up Instructions: In 2-3 months or sooner if needed   I discussed the assessment and treatment plan with the  patient. The patient was provided an opportunity to ask questions and all were answered. The patient agreed with the plan and demonstrated an understanding of the instructions.   The patient was advised to call back or seek an in-person evaluation if the symptoms worsen or if the condition fails to improve as anticipated.  I provided 10 minutes of non-face-to-face time during this encounter.   [name], MD

## 2020-09-03 ENCOUNTER — Encounter: Payer: Self-pay | Admitting: Nurse Practitioner

## 2020-09-03 ENCOUNTER — Other Ambulatory Visit: Payer: Self-pay

## 2020-09-03 ENCOUNTER — Inpatient Hospital Stay: Payer: Medicare PPO | Attending: Nurse Practitioner | Admitting: Nurse Practitioner

## 2020-09-03 VITALS — BP 131/86 | HR 75 | Temp 98.0°F | Resp 18 | Ht 64.0 in | Wt 172.8 lb

## 2020-09-03 DIAGNOSIS — Z79899 Other long term (current) drug therapy: Secondary | ICD-10-CM | POA: Insufficient documentation

## 2020-09-03 DIAGNOSIS — F17210 Nicotine dependence, cigarettes, uncomplicated: Secondary | ICD-10-CM | POA: Diagnosis not present

## 2020-09-03 DIAGNOSIS — I10 Essential (primary) hypertension: Secondary | ICD-10-CM | POA: Insufficient documentation

## 2020-09-03 DIAGNOSIS — Z800 Family history of malignant neoplasm of digestive organs: Secondary | ICD-10-CM | POA: Insufficient documentation

## 2020-09-03 DIAGNOSIS — E7800 Pure hypercholesterolemia, unspecified: Secondary | ICD-10-CM | POA: Insufficient documentation

## 2020-09-03 DIAGNOSIS — Z808 Family history of malignant neoplasm of other organs or systems: Secondary | ICD-10-CM | POA: Insufficient documentation

## 2020-09-03 DIAGNOSIS — E039 Hypothyroidism, unspecified: Secondary | ICD-10-CM | POA: Insufficient documentation

## 2020-09-03 DIAGNOSIS — C8338 Diffuse large B-cell lymphoma, lymph nodes of multiple sites: Secondary | ICD-10-CM

## 2020-09-03 DIAGNOSIS — C8330 Diffuse large B-cell lymphoma, unspecified site: Secondary | ICD-10-CM | POA: Diagnosis not present

## 2020-09-03 NOTE — Progress Notes (Signed)
[facility] OFFICE PROGRESS NOTE   Diagnosis: Non-Hodgkin's lymphoma  INTERVAL HISTORY:   Kendra Ball returns as scheduled.  She feels well.  No fevers or sweats.  No anorexia or weight loss.  She has not noticed any enlarged lymph nodes.  No interim illnesses or infections.    Objective:  Vital signs in last 24 hours:  Blood pressure 131/86, pulse 75, temperature 98 F (36.7 C), temperature source Oral, resp. rate 18, height 5\' 4"  (1.626 m), weight 172 lb 12.8 oz (78.4 kg), SpO2 93 %.    HEENT: Neck without mass. Lymphatics: No palpable cervical, supraclavicular, axillary or inguinal lymph nodes. Resp: Lungs are clear.  Distant breath sounds. Cardio: Regular rate and rhythm. GI: Abdomen soft and nontender.  No hepatosplenomegaly.  No mass. Vascular: No leg edema.    Lab Results:  Lab Results  Component Value Date   WBC 5.6 03/24/2020   HGB 14.8 03/24/2020   HCT 44.1 03/24/2020   MCV 98.6 03/24/2020   PLT 236.0 03/24/2020   NEUTROABS 4.7 05/22/2019    Imaging:  No results found.  Medications: I have reviewed the patient's current medications.  Assessment/Plan: 1. Non-Hodgkin's lymphoma, diffuse large B-cell lymphoma, CD20 positive  Ultrasound of the neck 02/18/2017-right supraclavicular adenopathy with at least 3 nodes, largest measuring 1.3 x 1.1 x 1.4 cm; 2 adjacent hypoechoic right occipital lymph nodes  Excisional biopsy right supraclavicular lymphadenopathy 60/04/930 (Dr. Deetta Perla lymphoid proliferation. Necrotic soft tissue with associated acute and chronic inflammation.By flow cytometry no monoclonal B-cell or phenotypically aberrant T-cell population. Special stains for microorganisms including AFB, GMS, PAS and Warthin-Starry, are negative.  CT chest 05/30/2017- multiple enlarged right axillary lymph nodes. Largest right axillary lymph node measures 2.6 x 2.2 cm. Single enlarged left axillary lymph node measuring 16 mm.  CT  neck 06/03/2017-known bilateral axillary lymphadenopathy partially covered on the scan. No lymphadenopathy seen in the neck.  Biopsy right axillary lymph node 06/22/2017(Dr. Tsuei)- fibroadipose tissue with necrosis, chronic inflammation and granulation tissue  PET scan 07/19/2017-hypermetabolic bilateral axillary lymph nodes. Hypermetabolic but small right supraclavicular lymph nodes. In the right omentum there is a small focus of rim density and internal fat density which is hypermetabolic.  CT neck 10/25/2017- progressedbilateral subclavian lymphadenopathy since February, right greater than left. Conspicuous enlargement of the small lymph nodes at the right level 2 and 3 nodal stations.  CT chest/abdomen/pelvis 10/25/2017-progressive adenopathy identified within the left axilla, bilateral supraclavicular stations and left inguinal region. Persistent but improved right axillary adenopathy. Scattered indeterminate low-attenuation foci within the spleen not seen on study from 05/30/2017.  Excisional biopsy of right supraclavicular and posterior triangle lymph nodes on7/25/2019-diffuse large B-cell lymphoma, CD20 positive  PET scan 06/28/5730-[facility] hypermetabolic lymph nodes and progressive hypermetabolism in existing lymph nodes. Hypermetabolic focus along the anterior aspect of the right scapula with adjacent soft tissue mass. No new adenopathy involving the mediastinal or hilar regions of the chest or the abdomen or pelvis. No obvious involvement of the liver or spleen.  IPI score 3  Cycle 1 CHOP/Rituxan 12/04/2017  Cycle 2 CHOP/Rituxan 12/27/2017  Cycle 3 CHOP/Rituxan 01/16/2018  Cycle 4 CHOP/Rituxan 02/05/2018  PET 02/19/2018- resolution of previously noted hypermetabolic adenopathy, no residual hypermetabolic osseous lesions  Cycle 5 CHOP/rituximab 02/27/2018  Cycle 6 CHOP/rituximab 03/20/2018 2. Hypertension  3. Hypothyroid 4. Hypercholesterolemia 5. Family history significant  for colon cancer, stomach cancer and brain cancer 6. Ongoing tobacco use 7. Port-A-Cath placement 12/01/2017, Interventional Radiology 8. Left arm edema-negative venous Doppler 02/26/2018  Disposition: Kendra Ball remains in clinical remission from [place].  She will return for follow-up in 6 months.  Plan for annual lung cancer screening chest CT in December of this year.  She will contact the office with any problems prior to her next visit.    Kendra Ball ANP/GNP-BC   09/03/2020  3:02 PM

## 2020-09-07 ENCOUNTER — Other Ambulatory Visit: Payer: Self-pay | Admitting: Cardiology

## 2020-09-07 ENCOUNTER — Other Ambulatory Visit (HOSPITAL_COMMUNITY): Payer: Self-pay | Admitting: Psychiatry

## 2020-09-07 DIAGNOSIS — F331 Major depressive disorder, recurrent, moderate: Secondary | ICD-10-CM

## 2020-09-07 DIAGNOSIS — F411 Generalized anxiety disorder: Secondary | ICD-10-CM

## 2020-09-07 NOTE — Telephone Encounter (Signed)
Refill sent to pharmacy.   

## 2020-09-27 ENCOUNTER — Emergency Department (HOSPITAL_BASED_OUTPATIENT_CLINIC_OR_DEPARTMENT_OTHER)
Admission: EM | Admit: 2020-09-27 | Discharge: 2020-09-27 | Disposition: A | Discharge status: Home / Self Care | Payer: Medicare PPO | Attending: Emergency Medicine | Admitting: Emergency Medicine

## 2020-09-27 ENCOUNTER — Emergency Department (HOSPITAL_BASED_OUTPATIENT_CLINIC_OR_DEPARTMENT_OTHER): Payer: Medicare PPO

## 2020-09-27 ENCOUNTER — Other Ambulatory Visit: Payer: Self-pay

## 2020-09-27 ENCOUNTER — Encounter (HOSPITAL_BASED_OUTPATIENT_CLINIC_OR_DEPARTMENT_OTHER): Payer: Self-pay | Admitting: Urology

## 2020-09-27 DIAGNOSIS — F17210 Nicotine dependence, cigarettes, uncomplicated: Secondary | ICD-10-CM | POA: Insufficient documentation

## 2020-09-27 DIAGNOSIS — M25512 Pain in left shoulder: Secondary | ICD-10-CM | POA: Diagnosis not present

## 2020-09-27 DIAGNOSIS — R0789 Other chest pain: Secondary | ICD-10-CM | POA: Diagnosis not present

## 2020-09-27 DIAGNOSIS — E039 Hypothyroidism, unspecified: Secondary | ICD-10-CM | POA: Diagnosis not present

## 2020-09-27 DIAGNOSIS — Z7982 Long term (current) use of aspirin: Secondary | ICD-10-CM | POA: Diagnosis not present

## 2020-09-27 DIAGNOSIS — M25511 Pain in right shoulder: Secondary | ICD-10-CM | POA: Insufficient documentation

## 2020-09-27 DIAGNOSIS — W010XXA Fall on same level from slipping, tripping and stumbling without subsequent striking against object, initial encounter: Secondary | ICD-10-CM | POA: Insufficient documentation

## 2020-09-27 DIAGNOSIS — W19XXXA Unspecified fall, initial encounter: Secondary | ICD-10-CM

## 2020-09-27 DIAGNOSIS — J449 Chronic obstructive pulmonary disease, unspecified: Secondary | ICD-10-CM | POA: Insufficient documentation

## 2020-09-27 DIAGNOSIS — R079 Chest pain, unspecified: Secondary | ICD-10-CM | POA: Diagnosis not present

## 2020-09-27 DIAGNOSIS — Z79899 Other long term (current) drug therapy: Secondary | ICD-10-CM | POA: Insufficient documentation

## 2020-09-27 DIAGNOSIS — I700 Atherosclerosis of aorta: Secondary | ICD-10-CM | POA: Diagnosis not present

## 2020-09-27 DIAGNOSIS — I10 Essential (primary) hypertension: Secondary | ICD-10-CM | POA: Insufficient documentation

## 2020-09-27 DIAGNOSIS — M47814 Spondylosis without myelopathy or radiculopathy, thoracic region: Secondary | ICD-10-CM | POA: Diagnosis not present

## 2020-09-27 MED ORDER — CYCLOBENZAPRINE HCL 10 MG PO TABS: 10.0000 mg | ORAL_TABLET | Freq: Every day | ORAL | 0 refills | Status: AC

## 2020-09-27 MED ORDER — HYDROCODONE-ACETAMINOPHEN 5-325 MG PO TABS: 1.0000 | ORAL_TABLET | ORAL | 0 refills | Status: AC | PRN

## 2020-09-27 MED ORDER — HYDROCODONE-ACETAMINOPHEN 7.5-325 MG/15ML PO SOLN
10.0000 mL | Freq: Once | ORAL | Status: AC
  Administered 2020-09-27: 10 mL via ORAL
  Filled 2020-09-27: qty 15

## 2020-09-27 NOTE — ED Provider Notes (Signed)
[facility] EMERGENCY DEPARTMENT Provider Note   CSN: [id number] Arrival date & time: 09/27/20  1216     History Chief Complaint  Patient presents with  . Fall    Kendra Ball is a 75 y.o. female.  HPI 75 year old female presents to the ER with complaints of pain across her chest and shoulders after a fall.  Patient reports falling on Friday, thinks she tripped over something and fell down to her knees.  Denies any dizziness, chest pain, shortness of breath at the time of the fall. She did try to catch herself on a dresser.  She did not hit her head or lose consciousness.  She has had some pain across her chest radiating into her armpits over the last 2 days.  Unrelieved with Tylenol.  Exacerbated with movement.  Denies any chest pain, shortness of breath.  Denies any numbness or tingling.    Past Medical History:  Diagnosis Date  . Abnormal mammogram 10/01/2013   Diagnostic [facility] & [place] confirm cyst & duct ectasia of L breast. 08/2013.   [name] Abnormal stress test 05/27/2020  . Abnormal weight gain 05/05/2020  . Adenopathy    right supraclavicular  . Anxiety   . Aortic atherosclerosis ([place]) 05/05/2020  . Arthritis   . At high risk for falls 01/29/2019  . Carotid bruit 05/08/2020  . Chronic diarrhea 10/13/2015  . COPD (chronic obstructive pulmonary disease) ([place])   . COPD (chronic obstructive pulmonary disease) with chronic bronchitis ([place]) 02/13/2015  . Coronary atherosclerosis 10/22/2015  . DEPRESSION 08/26/2008  . Diffuse large B cell lymphoma ([place]) 11/23/2017  . Dyslipidemia 10/03/2013   31% reduction LDL after 6 mos of Simvastatin 10 mg qd. RF: smoker, fam Hx, sedentary HDL is high!!   . Dyspnea on exertion 05/08/2020  . Essential hypertension 05/22/2014  . Family history of malignant neoplasm of gastrointestinal tract 05/05/2020  . Family history of premature coronary heart disease 08/13/2013   Father, brother, sister Personal risk factors: smoker, female over 3, fam Hx,  sedentary, thyroid disease, dyslipidemia, BMI over 25.   [name] GAD (generalized anxiety disorder) 01/30/2014  . Gait abnormality 08/06/2019  . H/O malignant lymphoma 05/23/2019  . Headache   . History of colon polyps 10/13/2015  . History of gastric bypass 09/10/2014  . Hx of colonic polyps    First noted on  colonoscopy 2012  . Hypercholesteremia   . HYPERTENSION 08/26/2008  . HYPOTHYROIDISM 08/26/2008  . Hypothyroidism 08/26/2008   Current meds: levothyroxine 100 mcg qd.    . Insomnia 05/05/2015  . Iron deficiency anemia 05/05/2020  . Lymphoma ([place]) 2019   B-cell  . MDD (major depressive disorder), recurrent episode, moderate ([place]) 01/30/2014   Pr[facility] 05/2015 08/18/2015:  Effexor XR 225mg  po qD for depression and anxiety Wellbutrin SR to 200mg  po BID for depression  Xanax 0.5mg  po BID and 1mg  po qHS prn anxiety     Routine PRN Medications: Yes  Consultations: encouraged to f/up with PCP as needed, encouraged to start individual therapy  Safety Concerns: Pt denies SI and is at an acute low risk for suicide.Patient told to call clinic i  . Multiple fractures 01/29/2019  . Obesity   . Osteoporosis   . Paresthesia 08/06/2019  . Port-A-Cath in place 12/27/2017  . Pulmonary nodule seen on imaging study 02/13/2015  . Smoker 09/14/2015  . Substance abuse [facility])    inpatient and outpatient tx for substance abuse  . TOBACCO USER 12/25/2009  . Tremor 05/23/2019  Patient Active Problem List   Diagnosis Date Noted  . Peripheral vascular disease, unspecified ([place]) bilateral carotic arterial disease 07/01/2020  . Abnormal stress test 05/27/2020  . Substance abuse ([place])   . Obesity   . Hx of colonic polyps   . Headache   . Hypercholesteremia   . COPD (chronic obstructive pulmonary disease) ([place])   . Arthritis   . Anxiety   . Adenopathy   . Dyspnea on exertion 05/08/2020  . Carotid bruit 05/08/2020  . Family history of malignant neoplasm of gastrointestinal tract 05/05/2020  . Abnormal weight gain  05/05/2020  . Iron deficiency anemia 05/05/2020  . Aortic atherosclerosis ([place]) 05/05/2020  . Gait abnormality 08/06/2019  . Paresthesia 08/06/2019  . Tremor 05/23/2019  . H/O malignant lymphoma 05/23/2019  . At high risk for falls 01/29/2019  . Multiple fractures 01/29/2019  . Port-A-Cath in place 12/27/2017  . Diffuse large B cell lymphoma ([place]) 11/23/2017  . Lymphoma ([place]) 2019  . Coronary atherosclerosis 10/22/2015  . History of colon polyps 10/13/2015  . Chronic diarrhea 10/13/2015  . Smoker 09/14/2015  . Insomnia 05/05/2015  . COPD (chronic obstructive pulmonary disease) with chronic bronchitis ([place]) 02/13/2015  . Pulmonary nodule seen on imaging study 02/13/2015  . Osteoporosis 11/11/2014  . History of gastric bypass 09/10/2014  . Essential hypertension 05/22/2014  . GAD (generalized anxiety disorder) 01/30/2014  . MDD (major depressive disorder), recurrent episode, moderate ([place]) 01/30/2014  . Dyslipidemia 10/03/2013  . Abnormal mammogram 10/01/2013  . Family history of premature coronary heart disease 08/13/2013  . TOBACCO USER 12/25/2009  . Hypothyroidism 08/26/2008    Past Surgical History:  Procedure Laterality Date  . AXILLARY LYMPH NODE BIOPSY Right 06/22/2017   Procedure: RIGHT AXILLARY LYMPH NODE BIOPSY;  Surgeon: [name], MD;  Location: [facility];  Service: General;  Laterality: Right;  . [place] SURGERY  2006  . CESAREAN SECTION    . COLON RESECTION N/A 05/09/2014   Procedure: LAPAROSCOPIC HAND-ASSISTED EXTENDED RIGHT COLECTOMY, LAPAROSCOPIC LYSIS OF ADHESIONS, SPLENIC FLEXURE MOBILIZATION.;  Surgeon: [name], MD;  Location: [facility];  Service: General;  Laterality: N/A;  . DILATION AND CURETTAGE OF UTERUS     x2  . FOREARM FRACTURE SURGERY     right  . GASTRIC BYPASS  2006  . IR IMAGING GUIDED PORT INSERTION  12/01/2017  . IR REMOVAL TUN ACCESS W/ PORT W/O FL MOD SED  07/03/2018  . MASS EXCISION Right 02/24/2017   Procedure: EXCISIONAL BIOPSY RIGHT  SUPRA-CLAVICULAR NODE;  Surgeon: [name], MD;  Location: [facility];  Service: ENT;  Laterality: Right;  . TONSILLECTOMY       OB History   No obstetric history on file.     Family History  Problem Relation Age of Onset  . Cancer Mother        colon  . Depression Mother   . Alzheimer's disease Mother        Dementia  . Heart disease Father 50  . Heart disease Sister 52       stents, pacemaker  . Depression Sister   . Heart disease Brother 19       CABG, MI  . Depression Brother   . Obesity Daughter   . Suicidality Daughter   . Bipolar disorder Daughter   . Obesity Son   . Mental illness Daughter        bipolar  . Obesity Daughter   . Depression Sister   . Dementia Paternal Aunt   .  Alcohol abuse Maternal Uncle   . Colon cancer Maternal Aunt   . Stomach cancer Maternal Grandfather     Social History   Tobacco Use  . Smoking status: Current Every Day Smoker    Packs/day: 1.00    Years: 50.00    Pack years: 50.00    Types: Cigarettes  . Smokeless tobacco: Never Used  Vaping Use  . Vaping Use: Never used  Substance Use Topics  . Alcohol use: No    Comment: quit on Aug 27, 2015  . Drug use: No    Frequency: 20.0 times per week    Comment: last used May 2017    Home Medications Prior to Admission medications   Medication Sig Start Date End Date Taking? Authorizing Provider  cyclobenzaprine (FLEXERIL) 10 MG tablet Take 1 tablet (10 mg total) by mouth at bedtime for 7 days. 09/27/20 10/04/20 Yes Garald Balding, PA-C  HYDROcodone-acetaminophen (NORCO/VICODIN) 5-325 MG tablet Take 1 tablet by mouth every 4 (four) hours as needed for up to 3 days. 09/27/20 09/30/20 Yes Garald Balding, PA-C  albuterol (VENTOLIN HFA) 108 (90 Base) MCG/ACT inhaler Inhale 2 puffs into the lungs every 6 (six) hours as needed for wheezing or shortness of breath. 03/24/20   Kuneff, Renee A, DO  alendronate (FOSAMAX) 70 MG tablet Take 1 tablet (70 mg total) by mouth every 7  (seven) days. Take with a full glass of water on an empty stomach. 03/24/20   Kuneff, Renee A, DO  aspirin EC 81 MG tablet Take 1 tablet (81 mg total) by mouth daily. Swallow whole. 05/26/20   Park Liter, MD  buPROPion (WELLBUTRIN SR) 150 MG 12 hr tablet Take 1 tablet (150 mg total) by mouth 2 (two) times daily. 08/13/20   [name], MD  busPIRone (BUSPAR) 10 MG tablet Take 1 tablet (10 mg total) by mouth 3 (three) times daily. 08/13/20   [name], MD  Cyanocobalamin 2500 MCG SUBL 1 tab sublingual daily 03/25/20   Kuneff, Renee A, DO  ferrous fumarate (HEMOCYTE - 106 MG FE) 325 (106 FE) MG TABS Take 1 tablet by mouth daily. Reported on 10/06/2015    [provider]  levothyroxine (SYNTHROID) 100 MCG tablet Take 1 tablet (100 mcg total) by mouth daily before breakfast. 03/25/20   Kuneff, Renee A, DO  Magnesium 250 MG TABS Take 250 mg by mouth daily.     [provider]  ranolazine (RANEXA) 500 MG 12 hr tablet TAKE 1 TABLET BY MOUTH TWICE A DAY 09/07/20   Park Liter, MD  rosuvastatin (CRESTOR) 10 MG tablet TAKE 1 TABLET BY MOUTH EVERY DAY 09/07/20   Park Liter, MD  traZODone (DESYREL) 100 MG tablet Take 2 tablets (200 mg total) by mouth at bedtime. 08/13/20   [name], MD  venlafaxine XR (EFFEXOR-XR) 150 MG 24 hr capsule Take 1 capsule (150 mg total) by mouth daily with breakfast. 08/13/20   [name], MD    Allergies    Morphine sulfate  Review of Systems   Review of Systems  Respiratory: Negative for shortness of breath.   Cardiovascular: Positive for chest pain.  Musculoskeletal: Positive for arthralgias.  Neurological: Negative for weakness and numbness.    Physical Exam Updated Vital Signs BP (!) 125/54   Pulse 64   Temp 97.8 F (36.6 C) (Oral)   Resp 20   Ht 5\' 4"  (1.626 m)   Wt 77.1 kg   SpO2 96%   BMI 29.18  kg/m   Physical Exam Vitals reviewed.  Constitutional:      General: She is not in acute distress.     Appearance: Normal appearance. She is not ill-appearing, toxic-appearing or diaphoretic.  HENT:     Head: Normocephalic and atraumatic.  Eyes:     General:        Right eye: No discharge.        Left eye: No discharge.     Extraocular Movements: Extraocular movements intact.     Conjunctiva/sclera: Conjunctivae normal.  Cardiovascular:     Pulses: Normal pulses.     Heart sounds: Normal heart sounds.  Pulmonary:     Effort: Pulmonary effort is normal.     Breath sounds: Normal breath sounds.  Abdominal:     General: Abdomen is flat.     Tenderness: There is no abdominal tenderness.  Musculoskeletal:        General: Tenderness present. No swelling, deformity or signs of injury. Normal range of motion.     Right lower leg: No edema.     Left lower leg: No edema.     Comments: Mild tenderness to palpation to the anterior chest wall.  No step-offs, crepitus, flail chest.  Full range of motion of her shoulders bilaterally, with 5/5 strength.  No midline tenderness of the C, T, L-spine.  Skin:    General: Skin is warm.  Neurological:     General: No focal deficit present.     Mental Status: She is alert and oriented to person, place, and time.  Psychiatric:        Mood and Affect: Mood normal.        Behavior: Behavior normal.     ED Results / Procedures / Treatments   Labs (all labs ordered are listed, but only abnormal results are displayed) Labs Reviewed - No data to display  EKG EKG Interpretation  Date/Time:  Sunday September 27 2020 13:38:40 EDT Ventricular Rate:  65 PR Interval:  175 QRS Duration: 140 QT Interval:  447 QTC Calculation: 465 R Axis:   109 Text Interpretation: Sinus rhythm Right bundle branch block Since prior ECG,conduction delay Confirmed by Schlossman, Erin ([id number]) on 09/27/2020 3:04:45 PM   Radiology DG Chest 2 View  Result Date: 09/27/2020 CLINICAL DATA:  Pain following recent fall EXAM: CHEST - 2 VIEW COMPARISON:  Chest radiograph October 17, 2018;  chest CT March 27, 2020 FINDINGS: Lungs are clear. Heart size and pulmonary vascularity are normal. No adenopathy. There is aortic atherosclerosis. There are foci of degenerative change in the thoracic spine. No pneumothorax. IMPRESSION: No edema or airspace opacity. Heart size normal. Aortic Atherosclerosis (ICD10-I70.0). Electronically Signed   By: William  Woodruff III M.D.   On: 09/27/2020 14:33    Procedures Procedures   Medications Ordered in ED Medications  HYDROcodone-acetaminophen (HYCET) 7.5-325 mg/15 ml solution 10 mL (10 mLs Oral Given 09/27/20 1430)    ED Course  I have reviewed the triage vital signs and the nursing notes.  Pertinent labs & imaging results that were available during my care of the patient were reviewed by me and considered in my medical decision making (see chart for details).    MDM Rules/Calculators/A&P                          75  year old female presents to the ER with complaints of chest wall pain after a fall.  Denies hitting her head or losing consciousness.  Fall was  2 days ago.  Denies any prodromal symptoms before the fall.  Reports this was a mechanical fall.  She has some reproducible chest wall tenderness but no noticeable step-offs, crepitus.  Pain is exacerbated with movement which makes me think this is more musculoskeletal.  Lower suspicion for cardiac cause.  EKG without any signs of ischemic changes.  She has no evidence of cervical, thoracic or lumbar injury.  X-rays without any evidence of rib fractures.  Patient was also seen and evaluated by Dr. Ronnald Nian.  Will provide short course of Norco, Flexeril, and encouraged her to use the Lidoderm patches but not to take them in conjunction with the muscle relaxer.  Encouraged PCP follow-up.  She could benefit from physical therapy.  We discussed return precautions.  She was understanding and is agreeable.  Stable for discharge. Final Clinical Impression(s) / ED Diagnoses Final diagnoses:  Fall,  initial encounter    Rx / DC Orders ED Discharge Orders         Ordered    HYDROcodone-acetaminophen (NORCO/VICODIN) 5-325 MG tablet  Every 4 hours PRN        09/27/20 1525    cyclobenzaprine (FLEXERIL) 10 MG tablet  Daily at bedtime        09/27/20 [place], Girtie Wiersma A, PA-C 09/27/20 1531    Lennice Sites, DO 09/27/20 2327

## 2020-09-27 NOTE — ED Triage Notes (Signed)
Fall friday, muscular pain across chest with getting up as well as shoulder pain, pain worse with cough, denies LOC with fall

## 2020-09-27 NOTE — Discharge Instructions (Signed)
You were evaluated in the Emergency Department and after careful evaluation, we did not find any emergent condition requiring admission or further testing in the hospital.  Your chest x-ray today was reassuring.  You may take the Norco, Flexeril which is a muscle relaxer.  Do not drink or drive on this medication and take it at night as it can make you sleepy.  You may also use Lidoderm patches which can be found over-the-counter, but do not take them together with the muscle relaxer.  Please return to the Emergency Department if you experience any worsening of your condition.  We encourage you to follow up with a primary care provider.  You may require further physical therapy referral.  Thank you for allowing [place] to be a part of your care.

## 2020-09-27 NOTE — ED Provider Notes (Signed)
I personally evaluated the patient during the encounter and completed a history, physical, procedures, medical decision making to contribute to the overall care of the patient and decision making for the patient briefly, the patient is a 75 y.o. female here with chest wall pain after fall several days ago.  Chest x-ray no rib fractures or pneumothorax.  Reproducible chest pain on exam.  Sinus rhythm on EKG.  No signs of respiratory distress.  Tylenol not helping much at home.  We will write her for short course of narcotics.  Recommend lidocaine patches and will prescribe Flexeril as well.  Educated about safely using these medications.  Discharged in good condition.  This chart was dictated using voice recognition software.  Despite best efforts to proofread,  errors can occur which can change the documentation meaning.     EKG Interpretation  Date/Time:  Sunday September 27 2020 13:38:40 EDT Ventricular Rate:  65 PR Interval:  175 QRS Duration: 140 QT Interval:  447 QTC Calculation: 465 R Axis:   109 Text Interpretation: Sinus rhythm Right bundle branch block Since prior ECG,conduction delay Confirmed by Gareth Morgan [phone]) on 09/27/2020 3:04:45 PM           Lennice Sites, DO 09/27/20 1527

## 2020-10-26 ENCOUNTER — Other Ambulatory Visit (HOSPITAL_COMMUNITY): Payer: Self-pay | Admitting: Psychiatry

## 2020-10-26 DIAGNOSIS — F1020 Alcohol dependence, uncomplicated: Secondary | ICD-10-CM

## 2020-10-26 DIAGNOSIS — F99 Mental disorder, not otherwise specified: Secondary | ICD-10-CM

## 2020-10-26 DIAGNOSIS — F331 Major depressive disorder, recurrent, moderate: Secondary | ICD-10-CM

## 2020-10-29 ENCOUNTER — Other Ambulatory Visit (HOSPITAL_COMMUNITY): Payer: Self-pay | Admitting: *Deleted

## 2020-10-29 DIAGNOSIS — F1020 Alcohol dependence, uncomplicated: Secondary | ICD-10-CM

## 2020-10-29 DIAGNOSIS — F5105 Insomnia due to other mental disorder: Secondary | ICD-10-CM

## 2020-10-29 DIAGNOSIS — F331 Major depressive disorder, recurrent, moderate: Secondary | ICD-10-CM

## 2020-10-29 MED ORDER — TRAZODONE HCL 100 MG PO TABS: 200.0000 mg | ORAL_TABLET | Freq: Every day | ORAL | 0 refills | Status: DC

## 2020-11-05 ENCOUNTER — Telehealth (INDEPENDENT_AMBULATORY_CARE_PROVIDER_SITE_OTHER): Payer: Medicare PPO | Admitting: Psychiatry

## 2020-11-05 ENCOUNTER — Other Ambulatory Visit: Payer: Self-pay

## 2020-11-05 DIAGNOSIS — F331 Major depressive disorder, recurrent, moderate: Secondary | ICD-10-CM

## 2020-11-05 DIAGNOSIS — F1020 Alcohol dependence, uncomplicated: Secondary | ICD-10-CM | POA: Diagnosis not present

## 2020-11-05 DIAGNOSIS — F99 Mental disorder, not otherwise specified: Secondary | ICD-10-CM | POA: Diagnosis not present

## 2020-11-05 DIAGNOSIS — F17200 Nicotine dependence, unspecified, uncomplicated: Secondary | ICD-10-CM | POA: Diagnosis not present

## 2020-11-05 DIAGNOSIS — F5105 Insomnia due to other mental disorder: Secondary | ICD-10-CM

## 2020-11-05 DIAGNOSIS — F411 Generalized anxiety disorder: Secondary | ICD-10-CM | POA: Diagnosis not present

## 2020-11-05 MED ORDER — VENLAFAXINE HCL ER 150 MG PO CP24: 150.0000 mg | ORAL_CAPSULE | Freq: Every day | ORAL | 0 refills | Status: DC

## 2020-11-05 MED ORDER — BUPROPION HCL ER (SR) 150 MG PO TB12: 150.0000 mg | ORAL_TABLET | Freq: Two times a day (BID) | ORAL | 0 refills | Status: DC

## 2020-11-05 MED ORDER — BUSPIRONE HCL 10 MG PO TABS: 10.0000 mg | ORAL_TABLET | Freq: Three times a day (TID) | ORAL | 0 refills | Status: DC

## 2020-11-05 MED ORDER — TRAZODONE HCL 100 MG PO TABS: 200.0000 mg | ORAL_TABLET | Freq: Every day | ORAL | 0 refills | Status: DC

## 2020-11-05 NOTE — Progress Notes (Signed)
Virtual Visit via Telephone Note  I connected with Kendra Ball on 11/05/20 at 11:30 AM EDT by telephone and verified that I am speaking with the correct person using two identifiers. We were unable to connect by video due to a system error.   Location: Patient: home Provider: office   I discussed the limitations, risks, security and privacy concerns of performing an evaluation and management service by telephone and the availability of in person appointments. I also discussed with the patient that there may be a patient responsible charge related to this service. The patient expressed understanding and agreed to proceed.   History of Present Illness: Kendra Ball is doing well. She spent time in [place] with several of her family members. She got back last week. Karle has no other plans for this summer. Her anxiety is a little worse. She is easily overwhelmed. She has a hard time dealing with even small changes in her day. It happens 1-2 days a week but notes that is worse when she has several stressors. Sometimes it is so bad that she wants to lie down. [name] does not know why this is happening. Other days her anxiety is mild and manageable. This has been going on for 4-6 weeks. Her sleep is good. Her depression is stable. She denies isolation and anhedonia. Her appetite is good but she admits she could make better food choices. [name] describes her energy are normal for her age. She denies SI/HI. She denies any alcohol use.    Observations/Objective:  General Appearance: unable to assess  Eye Contact:  unable to assess  Speech:  Clear and Coherent and Normal Rate  Volume:  Normal  Mood:  Euthymic  Affect:  Full Range  Thought Process:  Goal Directed, Linear, and Descriptions of Associations: Intact  Orientation:  Full (Time, Place, and Person)  Thought Content:  Logical  Suicidal Thoughts:  No  Homicidal Thoughts:  No  Memory:  Immediate;   Good  Judgement:  Good  Insight:   Good  Psychomotor Activity: unable to assess  Concentration:  Concentration: Good  Recall:  Good  Fund of Knowledge:  Good  Language:  Good  Akathisia:  unable to assess  Handed:  Right  AIMS (if indicated):     Assets:  Communication Skills Desire for Improvement Financial Resources/Insurance Housing Leisure Time Resilience Social Support Talents/Skills Transportation Vocational/Educational  ADL's:  unable to assess  Cognition:  WNL  Sleep:        Assessment and Plan: Depression screen [facility] 2/9 11/05/2020 08/13/2020 03/24/2020 04/10/2018 02/16/2017  Decreased Interest 0 0 0 0 0  Down, Depressed, Hopeless 0 0 0 0 0  PHQ - 2 Score 0 0 0 0 0  Altered sleeping - - 0 0 -  Tired, decreased energy - - 0 2 -  Change in appetite - - 0 2 -  Feeling bad or failure about yourself  - - 0 0 -  Trouble concentrating - - 0 2 -  Moving slowly or fidgety/restless - - 0 0 -  Suicidal thoughts - - 0 - -  PHQ-9 Score - - 0 6 -  Difficult doing work/chores - - - Not difficult at all -  Some recent data might be hidden    Flowsheet Row Video Visit from 11/05/2020 in [facility] ED from 09/27/2020 in [facility] Video Visit from 08/13/2020 in [facility] No Risk No Risk No  Risk      Pt does not want her meds changed today  1. Nicotine use disorder - buPROPion (WELLBUTRIN SR) 150 MG 12 hr tablet; Take 1 tablet (150 mg total) by mouth 2 (two) times daily.  Dispense: 180 tablet; Refill: 0  2. Alcohol use disorder, moderate, dependence (HCC) - busPIRone (BUSPAR) 10 MG tablet; Take 1 tablet (10 mg total) by mouth 3 (three) times daily.  Dispense: 270 tablet; Refill: 0 - traZODone (DESYREL) 100 MG tablet; Take 2 tablets (200 mg total) by mouth at bedtime.  Dispense: 180 tablet; Refill: 0  3. MDD (major depressive disorder), recurrent episode, moderate (HCC) -  busPIRone (BUSPAR) 10 MG tablet; Take 1 tablet (10 mg total) by mouth 3 (three) times daily.  Dispense: 270 tablet; Refill: 0 - traZODone (DESYREL) 100 MG tablet; Take 2 tablets (200 mg total) by mouth at bedtime.  Dispense: 180 tablet; Refill: 0 - venlafaxine XR (EFFEXOR-XR) 150 MG 24 hr capsule; Take 1 capsule (150 mg total) by mouth daily with breakfast.  Dispense: 90 capsule; Refill: 0  4. GAD (generalized anxiety disorder) - busPIRone (BUSPAR) 10 MG tablet; Take 1 tablet (10 mg total) by mouth 3 (three) times daily.  Dispense: 270 tablet; Refill: 0 - venlafaxine XR (EFFEXOR-XR) 150 MG 24 hr capsule; Take 1 capsule (150 mg total) by mouth daily with breakfast.  Dispense: 90 capsule; Refill: 0  5. Insomnia due to other mental disorder - traZODone (DESYREL) 100 MG tablet; Take 2 tablets (200 mg total) by mouth at bedtime.  Dispense: 180 tablet; Refill: 0   Follow Up Instructions: In 2-3 months or sooner if needed   I discussed the assessment and treatment plan with the patient. The patient was provided an opportunity to ask questions and all were answered. The patient agreed with the plan and demonstrated an understanding of the instructions.   The patient was advised to call back or seek an in-person evaluation if the symptoms worsen or if the condition fails to improve as anticipated.  I provided 11 minutes of non-face-to-face time during this encounter.   [name], MD

## 2020-11-09 ENCOUNTER — Ambulatory Visit: Payer: Medicare PPO | Admitting: Cardiology

## 2020-11-13 ENCOUNTER — Telehealth: Payer: Self-pay | Admitting: Family Medicine

## 2020-11-13 NOTE — Telephone Encounter (Signed)
Left message for patient to call back and schedule Medicare Annual Wellness Visit (AWV).   Please offer to do virtually or by telephone.   Last AWV:12/12/2016  Please schedule at anytime with Nurse Health Advisor.

## 2020-11-13 NOTE — Telephone Encounter (Signed)
Patient returned call and scheduled AWV telephone 11/25/20.

## 2020-11-25 ENCOUNTER — Ambulatory Visit (INDEPENDENT_AMBULATORY_CARE_PROVIDER_SITE_OTHER): Payer: Medicare PPO | Admitting: *Deleted

## 2020-11-25 DIAGNOSIS — Z0000 Encounter for general adult medical examination without abnormal findings: Secondary | ICD-10-CM

## 2020-11-25 NOTE — Progress Notes (Signed)
Subjective:   Kendra Ball is a 75 y.o. female who presents for Medicare Annual (Subsequent) preventive examination.  I connected with  [name] on 11/25/20 by a telephone telemedicine application and verified that I am speaking with the correct person using two identifiers.   I discussed the limitations of evaluation and management by telemedicine. The patient expressed understanding and agreed to proceed.   Review of Systems    na Cardiac Risk Factors include: advanced age (>44mn, >>73women);hypertension;dyslipidemia     Objective:    Today's Vitals   There is no height or weight on file to calculate BMI.  Advanced Directives 11/25/2020 09/27/2020 03/06/2020 08/19/2019 12/30/2018 12/30/2018 10/17/2018  Does Patient Have a Medical Advance Directive? No No No No No No No  Copy of Healthcare Power of Attorney in Chart? - - - - - - -  Would patient like information on creating a medical advance directive? No - Patient declined - No - Patient declined No - Patient declined - - No - Patient declined  Some encounter information is confidential and restricted. Go to Review Flowsheets activity to see all data.    Current Medications (verified) Outpatient Encounter Medications as of 11/25/2020  Medication Sig   albuterol (VENTOLIN HFA) 108 (90 Base) MCG/ACT inhaler Inhale 2 puffs into the lungs every 6 (six) hours as needed for wheezing or shortness of breath.   alendronate (FOSAMAX) 70 MG tablet Take 1 tablet (70 mg total) by mouth every 7 (seven) days. Take with a full glass of water on an empty stomach.   aspirin EC 81 MG tablet Take 1 tablet (81 mg total) by mouth daily. Swallow whole.   buPROPion (WELLBUTRIN SR) 150 MG 12 hr tablet Take 1 tablet (150 mg total) by mouth 2 (two) times daily.   busPIRone (BUSPAR) 10 MG tablet Take 1 tablet (10 mg total) by mouth 3 (three) times daily.   Cyanocobalamin 2500 MCG SUBL 1 tab sublingual daily   ferrous fumarate (HEMOCYTE - 106 MG FE)  325 (106 FE) MG TABS Take 1 tablet by mouth daily. Reported on 10/06/2015   levothyroxine (SYNTHROID) 100 MCG tablet Take 1 tablet (100 mcg total) by mouth daily before breakfast.   Magnesium 250 MG TABS Take 250 mg by mouth daily.    ranolazine (RANEXA) 500 MG 12 hr tablet TAKE 1 TABLET BY MOUTH TWICE A DAY   rosuvastatin (CRESTOR) 10 MG tablet TAKE 1 TABLET BY MOUTH EVERY DAY   traZODone (DESYREL) 100 MG tablet Take 2 tablets (200 mg total) by mouth at bedtime.   venlafaxine XR (EFFEXOR-XR) 150 MG 24 hr capsule Take 1 capsule (150 mg total) by mouth daily with breakfast.   No facility-administered encounter medications on file as of 11/25/2020.    Allergies (verified) Morphine sulfate   History: Past Medical History:  Diagnosis Date   Abnormal mammogram 10/01/2013   Diagnostic [facility] & [facility] cyst & duct ectasia of L breast. 08/2013.    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 RF: smoker, fam Hx, sedentary HDL is high!!    Dyspnea on exertion 05/08/2020   Essential hypertension 05/22/2014   Family history of malignant neoplasm of gastrointestinal tract 05/05/2020   Family history of premature coronary heart disease 08/13/2013   Father, brother, sister Personal risk factors: smoker, female over 26, fam Hx, sedentary, thyroid disease, dyslipidemia, BMI over 25.    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     Insomnia 05/05/2015   Iron deficiency anemia 05/05/2020   Lymphoma ([place]) 2019   B-cell   MDD (major depressive disorder), recurrent episode, moderate ([place]) 01/30/2014   [facility] 05/2015 08/18/2015:  Effexor XR '225mg'$  po qD for depression and anxiety Wellbutrin SR to '200mg'$  po BID for depression   Xanax 0.'5mg'$  po BID and '1mg'$  po qHS prn anxiety       Routine PRN Medications:  Yes    Consultations: encouraged to f/up with PCP as needed, encouraged to start individual therapy   Safety Concerns:  Pt denies SI and is at an acute low risk for suicide.Patient told to call clinic i   Multiple fractures 01/29/2019   Obesity    Osteoporosis    Paresthesia 08/06/2019   Port-A-Cath in place 12/27/2017   Pulmonary nodule seen on imaging study 02/13/2015   Smoker 09/14/2015   Substance abuse [facility])    inpatient and outpatient tx for substance abuse   TOBACCO USER 12/25/2009   Tremor 05/23/2019   Past Surgical History:  Procedure Laterality Date   AXILLARY LYMPH NODE BIOPSY Right 06/22/2017   Procedure: RIGHT AXILLARY LYMPH NODE BIOPSY;  Surgeon: [name], MD;  Location: [facility];  Service: General;  Laterality: Right;   BARIATRIC SURGERY  2006   CESAREAN SECTION     COLON RESECTION N/A 05/09/2014   Procedure: LAPAROSCOPIC HAND-ASSISTED EXTENDED RIGHT COLECTOMY, LAPAROSCOPIC LYSIS OF ADHESIONS, SPLENIC FLEXURE MOBILIZATION.;  Surgeon: [name], MD;  Location: [facility];  Service: General;  Laterality: N/A;   DILATION AND CURETTAGE OF UTERUS     x2   FOREARM FRACTURE SURGERY     right   GASTRIC BYPASS  2006   IR IMAGING GUIDED PORT INSERTION  12/01/2017   IR REMOVAL TUN ACCESS W/ PORT W/O FL MOD SED   07/03/2018   MASS EXCISION Right 02/24/2017   Procedure: EXCISIONAL BIOPSY RIGHT SUPRA-CLAVICULAR NODE;  Surgeon: [name], MD;  Location: [place];  Service: ENT;  Laterality: Right;   TONSILLECTOMY     Family History  Problem Relation Age of Onset   Cancer Mother        colon   Depression Mother    Alzheimer's disease Mother        Dementia   Heart disease Father 38   Heart disease Sister 6       stents, pacemaker   Depression Sister    Heart disease Brother 100       CABG, MI   Depression Brother    Obesity Daughter  Suicidality Daughter    Bipolar disorder Daughter    Obesity Son    Mental illness Daughter        bipolar   Obesity Daughter    Depression Sister    Dementia Paternal Aunt    Alcohol abuse Maternal Uncle    Colon cancer Maternal Aunt    Stomach cancer Maternal Grandfather    Social History   Socioeconomic History   Marital status: Married    Spouse name: Not on file   Number of children: 3   Years of education: Not on file   Highest education level: Not on file  Occupational History   Occupation: RETIRED    Employer: [employer]  Tobacco Use   Smoking status: Every Day    Packs/day: 1.00    Years: 50.00    Pack years: 50.00    Types: Cigarettes   Smokeless tobacco: Never  Vaping Use   Vaping Use: Never used  Substance and Sexual Activity   Alcohol use: No    Comment: quit on Aug 27, 2015   Drug use: No    Frequency: 20.0 times per week    Comment: last used May 2017   Sexual activity: Not Currently    Partners: Male    Birth control/protection: Post-menopausal  Other Topics Concern   Not on file  Social History Narrative   Ms. Tuy lives in [place] with her husband. She has 3 grown children and several grand children. She has joint custody of 3 of her grand children as her daughter has bipolar disorder and has needed assistance with children in past.   1-2 cups coffee, 3-6 cups tea per day.    Right-handed.   Social Determinants of Health   Financial Resource Strain: Low Risk    Difficulty of Paying Living Expenses: Not hard at all  Food Insecurity: No Food Insecurity   Worried About Charity fundraiser in the Last Year: Never true   [place] in the Last Year: Never true  Transportation Needs: No Transportation Needs   Lack of Transportation (Medical): No   Lack of Transportation (Non-Medical): No  Physical Activity: Inactive   Days of Exercise per Week: 0 days   Minutes of Exercise per Session: 0 min  Stress: No Stress Concern Present   Feeling of Stress : Not at all  Social Connections: Moderately Isolated   Frequency of Communication with Friends and Family: More than three times a week   Frequency of Social Gatherings with Friends and Family: Twice a week   Attends Religious Services: Never   Marine scientist or Organizations: No   Attends Music therapist: Never   Marital Status: Married    Tobacco Counseling Ready to quit: Not Answered Counseling given: Not Answered   Clinical Intake:  Pre-visit preparation completed: Yes  Pain : No/denies pain     Nutritional Risks: None Diabetes: No  How often do you need to have someone help you when you read instructions, pamphlets, or other written materials from your doctor or pharmacy?: 1 - Never  Diabetic?no  Interpreter Needed?: No  Information entered by :: Leroy Kennedy LPN   Activities of Daily Living In your present state of health, do you have any difficulty performing the following activities: 11/25/2020  Hearing? N  Vision? N  Difficulty concentrating or making decisions? N  Walking or climbing stairs? N  Dressing or bathing? N  Doing errands, shopping? N  Preparing Food  and eating ? N  Using the Toilet? N  In the past six months, have you accidently leaked urine? N  Do you have problems with loss of bowel control? N  Managing your Medications? N  Managing your  Finances? N  Housekeeping or managing your Housekeeping? N  Some recent data might be hidden    Patient Care Team: Ma Hillock, DO as PCP - General (Family Medicine) Juanita Craver, MD as Consulting Physician (Gastroenterology) Excell Seltzer, MD (Inactive) as Consulting Physician (General Surgery) Brandon Melnick, [place] (Inactive) (Psychology) [name], MD (Psychiatry) Ladell Pier, MD as Consulting Physician (Oncology)  Indicate any recent Medical Services you may have received from other than [facility] providers in the past year (date may be approximate).     Assessment:   This is a routine wellness examination for [place].  Hearing/Vision screen Hearing Screening - Comments:: No trouble hearing Vision Screening - Comments:: [facility] Not up to date  Dietary issues and exercise activities discussed: Current Exercise Habits: The patient does not participate in regular exercise at present   Goals Addressed             This Visit's Progress    Increase physical activity         Depression Screen PHQ 2/9 Scores 11/25/2020 03/24/2020 04/10/2018 04/10/2018 02/16/2017 12/12/2016 10/16/2015  PHQ - 2 Score 0 0 0 - 0 2 0  PHQ- 9 Score - 0 6 - - 6 -  Exception Documentation - - Medical reason Medical reason - - -  Some encounter information is confidential and restricted. Go to Review Flowsheets activity to see all data.    Fall Risk Fall Risk  11/25/2020 03/24/2020 04/10/2018 02/16/2017 12/12/2016  Falls in the past year? 1 0 0 No No  Number falls in past yr: 1 0 0 - -  Comment pulled a muscle in chest/ broken toe/ ankle.    step on curb/stodd up and fell - - - -  Injury with Fall? 1 0 0 - -  Risk for fall due to : - - - - -  Follow up Falls evaluation completed;Falls prevention discussed Falls evaluation completed - - -    FALL RISK PREVENTION PERTAINING TO THE HOME:  Any stairs in or around the home? Yes  If so, are there any without handrails? Yes  Home free of  loose throw rugs in walkways, pet beds, electrical cords, etc? Yes  Adequate lighting in your home to reduce risk of falls? Yes   ASSISTIVE DEVICES UTILIZED TO PREVENT FALLS:  Life alert? No  Use of a cane, walker or w/c? No  Grab bars in the bathroom? No  Shower chair or bench in shower? No  Elevated toilet seat or a handicapped toilet? No   TIMED UP AND GO:  Was the test performed? No .    Cognitive Function:  Normal cognitive status assessed by direct observation by this Nurse Health Advisor. No abnormalities found.          Immunizations Immunization History  Administered Date(s) Administered   Fluad Quad(high Dose 65+) 01/29/2019   Influenza Whole 05/07/2010   Influenza, High Dose Seasonal PF 05/25/2016, 02/16/2017   Influenza,inj,Quad PF,6+ Mos 03/13/2014, 12/30/2014, 02/05/2018   Influenza-Unspecified 03/03/2020   Moderna Sars-Covid-2 Vaccination 05/24/2019, 06/21/2019   PFIZER(Purple Top)SARS-COV-2 Vaccination 03/03/2020   PPD Test 09/20/2013   Pneumococcal Conjugate-13 09/10/2014   Pneumococcal Polysaccharide-23 08/13/2013   Tdap 08/08/2005    TDAP status: Due, Education has been provided regarding  the importance of this vaccine. Advised may receive this vaccine at local pharmacy or Health Dept. Aware to provide a copy of the vaccination record if obtained from local pharmacy or Health Dept. Verbalized acceptance and understanding.  Flu Vaccine status: Up to date  Pneumococcal vaccine status: Up to date  Covid-19 vaccine status: Completed vaccines  Qualifies for Shingles Vaccine? Yes   Zostavax completed No   Shingrix Completed?: Yes  Screening Tests Health Maintenance  Topic Date Due   Zoster Vaccines- Shingrix (1 of 2) Never done   TETANUS/TDAP  08/09/2015   COVID-19 Vaccine (4 - Booster) 06/03/2020   INFLUENZA VACCINE  11/23/2020   COLONOSCOPY (Pts 45-67yr Insurance coverage will need to be confirmed)  01/20/2021   MAMMOGRAM  05/14/2021   DEXA  SCAN  05/14/2021   Hepatitis C Screening  Completed   PNA vac Low Risk Adult  Completed   HPV VACCINES  Aged Out    Health Maintenance  Health Maintenance Due  Topic Date Due   Zoster Vaccines- Shingrix (1 of 2) Never done   TETANUS/TDAP  08/09/2015   COVID-19 Vaccine (4 - Booster) 06/03/2020   INFLUENZA VACCINE  11/23/2020    Colorectal cancer screening: Type of screening: Colonoscopy. Completed  . Repeat every no longer required years  Mammogram status: Completed  . Repeat every year  Bone Density status: Completed  . Results reflect: Bone density results: OSTEOPOROSIS. Repeat every 2 years.  Lung Cancer Screening: (Low Dose CT Chest recommended if Age 74-80years, 30 pack-year currently smoking OR have quit w/in 15years.) does not qualify.   Lung Cancer Screening Referral:   Additional Screening:  Hepatitis C Screening: does not qualify; Completed   Vision Screening: Recommended annual ophthalmology exams for early detection of glaucoma and other disorders of the eye. Is the patient up to date with their annual eye exam?  No  Who is the provider or what is the name of the office in which the patient attends annual eye exams? Names given If pt is not established with a provider, would they like to be referred to a provider to establish care? No .   Dental Screening: Recommended annual dental exams for proper oral hygiene  Community Resource Referral / Chronic Care Management: CRR required this visit?  No   CCM required this visit?  No      Plan:     I have personally reviewed and noted the following in the patient's chart:   Medical and social history Use of alcohol, tobacco or illicit drugs  Current medications and supplements including opioid prescriptions.  Functional ability and status Nutritional status Physical activity Advanced directives List of other physicians Hospitalizations, surgeries, and ER visits in previous 12 months Vitals Screenings to  include cognitive, depression, and falls Referrals and appointments  In addition, I have reviewed and discussed with patient certain preventive protocols, quality metrics, and best practice recommendations. A written personalized care plan for preventive services as well as general preventive health recommendations were provided to patient.     JLeroy Kennedy LPN   [id number]  Nurse Notes: na

## 2020-11-25 NOTE — Patient Instructions (Addendum)
[name] , Thank you for taking time to come for your Medicare Wellness Visit. I appreciate your ongoing commitment to your health goals. Please review the following plan we discussed and let me know if I can assist you in the future.   Screening recommendations/referrals: Colonoscopy: no longer required Mammogram: up to date Bone Density: up to date Recommended yearly ophthalmology/optometry visit for glaucoma screening and checkup Recommended yearly dental visit for hygiene and checkup  Vaccinations: Influenza vaccine: up to date Pneumococcal vaccine: up to date Tdap vaccine: Education provided Shingles vaccine: Education provided    Advanced directives: Education provided  Conditions/risks identified: na     Preventive Care 6 Years and Older, Female Preventive care refers to lifestyle choices and visits with your health care provider that can promote health and wellness. What does preventive care include? A yearly physical exam. This is also called an annual well check. Dental exams once or twice a year. Routine eye exams. Ask your health care provider how often you should have your eyes checked. Personal lifestyle choices, including: Daily care of your teeth and gums. Regular physical activity. Eating a healthy diet. Avoiding tobacco and drug use. Limiting alcohol use. Practicing safe sex. Taking low-dose aspirin every day. Taking vitamin and mineral supplements as recommended by your health care provider. What happens during an annual well check? The services and screenings done by your health care provider during your annual well check will depend on your age, overall health, lifestyle risk factors, and family history of disease. Counseling  Your health care provider may ask you questions about your: Alcohol use. Tobacco use. Drug use. Emotional well-being. Home and relationship well-being. Sexual activity. Eating habits. History of falls. Memory and  ability to understand (cognition). Work and work Statistician. Reproductive health. Screening  You may have the following tests or measurements: Height, weight, and BMI. Blood pressure. Lipid and cholesterol levels. These may be checked every 5 years, or more frequently if you are over 81 years old. Skin check. Lung cancer screening. You may have this screening every year starting at age 59 if you have a 30-pack-year history of smoking and currently smoke or have quit within the past 15 years. Fecal occult blood test (FOBT) of the stool. You may have this test every year starting at age 37. Flexible sigmoidoscopy or colonoscopy. You may have a sigmoidoscopy every 5 years or a colonoscopy every 10 years starting at age 60. Hepatitis C blood test. Hepatitis B blood test. Sexually transmitted disease (STD) testing. Diabetes screening. This is done by checking your blood sugar (glucose) after you have not eaten for a while (fasting). You may have this done every 1-3 years. Bone density scan. This is done to screen for osteoporosis. You may have this done starting at age 105. Mammogram. This may be done every 1-2 years. Talk to your health care provider about how often you should have regular mammograms. Talk with your health care provider about your test results, treatment options, and if necessary, the need for more tests. Vaccines  Your health care provider may recommend certain vaccines, such as: Influenza vaccine. This is recommended every year. Tetanus, diphtheria, and acellular pertussis (Tdap, Td) vaccine. You may need a Td booster every 10 years. Zoster vaccine. You may need this after age 59. Pneumococcal 13-valent conjugate (PCV13) vaccine. One dose is recommended after age 62. Pneumococcal polysaccharide (PPSV23) vaccine. One dose is recommended after age 30. Talk to your health care provider about which screenings and vaccines you  need and how often you need them. This information is  not intended to replace advice given to you by your health care provider. Make sure you discuss any questions you have with your health care provider. Document Released: 05/08/2015 Document Revised: 12/30/2015 Document Reviewed: 02/10/2015 Elsevier Interactive Patient Education  2017 [employer] Prevention in the Home Falls can cause injuries. They can happen to people of all ages. There are many things you can do to make your home safe and to help prevent falls. What can I do on the outside of my home? Regularly fix the edges of walkways and driveways and fix any cracks. Remove anything that might make you trip as you walk through a door, such as a raised step or threshold. Trim any bushes or trees on the path to your home. Use bright outdoor lighting. Clear any walking paths of anything that might make someone trip, such as rocks or tools. Regularly check to see if handrails are loose or broken. Make sure that both sides of any steps have handrails. Any raised decks and porches should have guardrails on the edges. Have any leaves, snow, or ice cleared regularly. Use sand or salt on walking paths during winter. Clean up any spills in your garage right away. This includes oil or grease spills. What can I do in the bathroom? Use night lights. Install grab bars by the toilet and in the tub and shower. Do not use towel bars as grab bars. Use non-skid mats or decals in the tub or shower. If you need to sit down in the shower, use a plastic, non-slip stool. Keep the floor dry. Clean up any water that spills on the floor as soon as it happens. Remove soap buildup in the tub or shower regularly. Attach bath mats securely with double-sided non-slip rug tape. Do not have throw rugs and other things on the floor that can make you trip. What can I do in the bedroom? Use night lights. Make sure that you have a light by your bed that is easy to reach. Do not use any sheets or blankets that  are too big for your bed. They should not hang down onto the floor. Have a firm chair that has side arms. You can use this for support while you get dressed. Do not have throw rugs and other things on the floor that can make you trip. What can I do in the kitchen? Clean up any spills right away. Avoid walking on wet floors. Keep items that you use a lot in easy-to-reach places. If you need to reach something above you, use a strong step stool that has a grab bar. Keep electrical cords out of the way. Do not use floor polish or wax that makes floors slippery. If you must use wax, use non-skid floor wax. Do not have throw rugs and other things on the floor that can make you trip. What can I do with my stairs? Do not leave any items on the stairs. Make sure that there are handrails on both sides of the stairs and use them. Fix handrails that are broken or loose. Make sure that handrails are as long as the stairways. Check any carpeting to make sure that it is firmly attached to the stairs. Fix any carpet that is loose or worn. Avoid having throw rugs at the top or bottom of the stairs. If you do have throw rugs, attach them to the floor with carpet tape. Make sure that  you have a light switch at the top of the stairs and the bottom of the stairs. If you do not have them, ask someone to add them for you. What else can I do to help prevent falls? Wear shoes that: Do not have high heels. Have rubber bottoms. Are comfortable and fit you well. Are closed at the toe. Do not wear sandals. If you use a stepladder: Make sure that it is fully opened. Do not climb a closed stepladder. Make sure that both sides of the stepladder are locked into place. Ask someone to hold it for you, if possible. Clearly mark and make sure that you can see: Any grab bars or handrails. First and last steps. Where the edge of each step is. Use tools that help you move around (mobility aids) if they are needed. These  include: Canes. Walkers. Scooters. Crutches. Turn on the lights when you go into a dark area. Replace any light bulbs as soon as they burn out. Set up your furniture so you have a clear path. Avoid moving your furniture around. If any of your floors are uneven, fix them. If there are any pets around you, be aware of where they are. Review your medicines with your doctor. Some medicines can make you feel dizzy. This can increase your chance of falling. Ask your doctor what other things that you can do to help prevent falls. This information is not intended to replace advice given to you by your health care provider. Make sure you discuss any questions you have with your health care provider. Document Released: 02/05/2009 Document Revised: 09/17/2015 Document Reviewed: 05/16/2014 Elsevier Interactive Patient Education  2017 [employer].

## 2021-01-05 ENCOUNTER — Other Ambulatory Visit: Payer: Self-pay

## 2021-01-05 ENCOUNTER — Encounter: Payer: Self-pay | Admitting: Cardiology

## 2021-01-05 ENCOUNTER — Ambulatory Visit (INDEPENDENT_AMBULATORY_CARE_PROVIDER_SITE_OTHER): Payer: Medicare PPO | Admitting: Cardiology

## 2021-01-05 VITALS — BP 112/68 | HR 71 | Ht 64.0 in | Wt 176.0 lb

## 2021-01-05 DIAGNOSIS — F17200 Nicotine dependence, unspecified, uncomplicated: Secondary | ICD-10-CM | POA: Diagnosis not present

## 2021-01-05 DIAGNOSIS — R0609 Other forms of dyspnea: Secondary | ICD-10-CM

## 2021-01-05 DIAGNOSIS — R0600 Dyspnea, unspecified: Secondary | ICD-10-CM

## 2021-01-05 DIAGNOSIS — E785 Hyperlipidemia, unspecified: Secondary | ICD-10-CM | POA: Diagnosis not present

## 2021-01-05 DIAGNOSIS — I779 Disorder of arteries and arterioles, unspecified: Secondary | ICD-10-CM

## 2021-01-05 DIAGNOSIS — I739 Peripheral vascular disease, unspecified: Secondary | ICD-10-CM

## 2021-01-05 DIAGNOSIS — I2510 Atherosclerotic heart disease of native coronary artery without angina pectoris: Secondary | ICD-10-CM

## 2021-01-05 DIAGNOSIS — R9439 Abnormal result of other cardiovascular function study: Secondary | ICD-10-CM | POA: Diagnosis not present

## 2021-01-05 DIAGNOSIS — I10 Essential (primary) hypertension: Secondary | ICD-10-CM | POA: Diagnosis not present

## 2021-01-05 NOTE — Progress Notes (Signed)
Cardiology Office Note:    Date:  01/05/2021   ID:  Kendra Ball, DOB 1945/09/28, MRN [id number]  PCP:  Ma Hillock, DO  Cardiologist:  Jenne Campus, MD    Referring MD: Ma Hillock, DO   Chief Complaint  Patient presents with   Follow-up  Doing well  History of Present Illness:    Kendra Ball is a 75 y.o. female   who was sent to [place] initially because of CT of her chest showing some calcification of the coronary arteries.  After that we did stress test which showed some possibility of apical MI with some peri-infarct ischemia.  Then she had echocardiogram done to check left ventricle ejection fraction, it did show relatively preserved left ventricle ejection fraction there was hypokinesis however involving inferior wall.  Involving basal to midportion of the inferior wall.  There was some inconsistency between those 2 tests.  Also had carotic ultrasound done which showed evidence of peripheral vascular disease with bilateral carotic arterial stenosis 1 side up to 80% in outside up to 60%. She comes to the office today for follow-up.  Overall she is doing very well.  She denies have any chest pain tightness squeezing pressure burning chest.  Sadly she still continues to smoke.  There is no TIA/CVA-like symptoms.  Past Medical History:  Diagnosis Date   Abnormal mammogram 10/01/2013   Diagnostic [facility] & [place] confirm cyst & duct ectasia of L breast. 08/2013.    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 RF: smoker, fam Hx, sedentary HDL is high!!    Dyspnea on exertion 05/08/2020   Essential hypertension 05/22/2014   Family history of malignant neoplasm of gastrointestinal tract 05/05/2020   Family history of premature coronary heart disease 08/13/2013   Father, brother, sister Personal risk factors: smoker, female over 49, fam Hx, sedentary, thyroid disease, dyslipidemia, BMI over 25.    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     Insomnia 05/05/2015   Iron deficiency anemia 05/05/2020   Lymphoma ([place]) 2019   B-cell   MDD (major depressive disorder), recurrent episode, moderate ([place]) 01/30/2014   [facility] 05/2015 08/18/2015:  Effexor XR '225mg'$  po qD for depression and anxiety Wellbutrin SR to '200mg'$  po BID for depression   Xanax 0.'5mg'$  po BID and '1mg'$  po qHS prn anxiety       Routine PRN Medications:  Yes    Consultations: encouraged to f/up with PCP as needed, encouraged to start individual therapy   Safety Concerns:  Pt denies  SI and is at an acute low risk for suicide.Patient told to call clinic i   Multiple fractures 01/29/2019   Obesity    Osteoporosis    Paresthesia 08/06/2019   Port-A-Cath in place 12/27/2017   Pulmonary nodule seen on imaging study 02/13/2015   Smoker 09/14/2015   Substance abuse ([place])    inpatient and outpatient tx for substance abuse   TOBACCO USER 12/25/2009   Tremor 05/23/2019    Past Surgical History:  Procedure Laterality Date   AXILLARY LYMPH NODE BIOPSY Right 06/22/2017   Procedure: RIGHT AXILLARY LYMPH NODE BIOPSY;  Surgeon: [name],  MD;  Location: [facility];  Service: General;  Laterality: Right;   BARIATRIC SURGERY  2006   CESAREAN SECTION     COLON RESECTION N/A 05/09/2014   Procedure: LAPAROSCOPIC HAND-ASSISTED EXTENDED RIGHT COLECTOMY, LAPAROSCOPIC LYSIS OF ADHESIONS, SPLENIC FLEXURE MOBILIZATION.;  Surgeon: [name], MD;  Location: [facility];  Service: General;  Laterality: N/A;   DILATION AND CURETTAGE OF UTERUS     x2   FOREARM FRACTURE SURGERY     right   GASTRIC BYPASS  2006   IR IMAGING GUIDED PORT INSERTION  12/01/2017   IR REMOVAL TUN ACCESS W/ PORT W/O FL MOD SED  07/03/2018   MASS EXCISION Right 02/24/2017   Procedure: EXCISIONAL BIOPSY RIGHT SUPRA-CLAVICULAR NODE;  Surgeon: [name], MD;  Location: [facility];  Service: ENT;  Laterality: Right;   TONSILLECTOMY      Current Medications: Current Meds  Medication Sig   albuterol (VENTOLIN HFA) 108 (90 Base) MCG/ACT inhaler Inhale 2 puffs into the lungs every 6 (six) hours as needed for wheezing or shortness of breath.   alendronate (FOSAMAX) 70 MG tablet Take 1 tablet (70 mg total) by mouth every 7 (seven) days. Take with a full glass of water on an empty stomach.   aspirin EC 81 MG tablet Take 1 tablet (81 mg total) by mouth daily. Swallow whole.   buPROPion (WELLBUTRIN SR) 150 MG 12 hr tablet Take 1 tablet (150 mg total) by mouth 2 (two) times daily.   busPIRone (BUSPAR) 10 MG tablet Take 1 tablet (10 mg total) by mouth 3 (three) times daily.   Cyanocobalamin 2500 MCG SUBL 1 tab sublingual daily (Patient taking differently: Take 1 tablet by mouth daily. 1 tab sublingual daily)   ferrous fumarate (HEMOCYTE - 106 MG FE) 325 (106 FE) MG TABS Take 1 tablet by mouth daily. Reported on 10/06/2015   levothyroxine (SYNTHROID) 100 MCG tablet Take 1 tablet (100 mcg total) by mouth daily before breakfast.   Magnesium 250 MG TABS Take 250 mg by mouth daily.    ranolazine (RANEXA) 500 MG 12 hr tablet TAKE 1 TABLET BY MOUTH TWICE A DAY (Patient taking  differently: Take 500 mg by mouth 2 (two) times daily.)   rosuvastatin (CRESTOR) 10 MG tablet TAKE 1 TABLET BY MOUTH EVERY DAY (Patient taking differently: Take 10 mg by mouth daily.)   traZODone (DESYREL) 100 MG tablet Take 2 tablets (200 mg total) by mouth at bedtime.   venlafaxine XR (EFFEXOR-XR) 150 MG 24 hr capsule Take 1 capsule (150 mg total) by mouth daily with breakfast.     Allergies:   Morphine sulfate   Social History   Socioeconomic History   Marital status: Married    Spouse name: Not on file   Number of children: 3   Years of education: Not on file   Highest education level: Not on file  Occupational History   Occupation: RETIRED    Employer: [employer]  Tobacco Use   Smoking status: Every Day    Packs/day: 1.00    Years: 50.00    Pack years: 50.00    Types: Cigarettes   Smokeless tobacco: Never  Vaping Use   Vaping Use: Never used  Substance and Sexual Activity   Alcohol use: No    Comment: quit on Aug 27, 2015   Drug use: No    Frequency: 20.0 times per week    Comment: last used May 2017   Sexual activity: Not Currently    Partners: Male    Birth control/protection: Post-menopausal  Other Topics Concern   Not on file  Social History Narrative   Ms. Sjodin lives in [place] with her husband. She has 3 grown children and several grand children. She has joint custody of 3 of her grand children as her daughter has bipolar disorder and has needed assistance with children in past.   1-2 cups coffee, 3-6 cups tea per day.   Right-handed.   Social Determinants of Health   Financial Resource Strain: Low Risk    Difficulty of Paying Living Expenses: Not hard at all  Food Insecurity: No Food Insecurity   Worried About Charity fundraiser in the Last Year: Never true   [place] in the Last Year: Never true  Transportation Needs: No Transportation Needs   Lack of Transportation (Medical): No   Lack of Transportation (Non-Medical): No   Physical Activity: Inactive   Days of Exercise per Week: 0 days   Minutes of Exercise per Session: 0 min  Stress: No Stress Concern Present   Feeling of Stress : Not at all  Social Connections: Moderately Isolated   Frequency of Communication with Friends and Family: More than three times a week   Frequency of Social Gatherings with Friends and Family: Twice a week   Attends Religious Services: Never   Marine scientist or Organizations: No   Attends Music therapist: Never   Marital Status: Married     Family History: The patient's family history includes Alcohol abuse in her maternal uncle; Alzheimer's disease in her mother; Bipolar disorder in her daughter; Cancer in her mother; Colon cancer in her maternal aunt; Dementia in her paternal aunt; Depression in her brother, mother, sister, and sister; Heart disease (age of onset: 33) in her father; Heart disease (age of onset: 21) in her sister; Heart disease (age of onset: 28) in her brother; Mental illness in her daughter; Obesity in her daughter, daughter, and son; Stomach cancer in her maternal grandfather; Suicidality in her daughter. ROS:   Please see the history of present illness.    All 14 point review of systems negative except as described per history of present illness  EKGs/Labs/Other Studies Reviewed:      Recent Labs: 03/24/2020: ALT 13; BUN 8; Creatinine, Ser 0.73; Hemoglobin 14.8; Platelets 236.0; Potassium 4.6; Sodium 141; TSH 1.31  Recent Lipid Panel    Component Value Date/Time   CHOL 168 03/24/2020 1014   TRIG 86.0 03/24/2020 1014   HDL 89.90 03/24/2020 1014   CHOLHDL 2 03/24/2020 1014   VLDL 17.2 03/24/2020 1014   LDLCALC 61 03/24/2020 1014   LDLDIRECT 133.5 08/09/2012 1036    Physical Exam:    VS:  BP 112/68 (BP Location: Right Arm, Patient Position: Sitting)   Pulse 71   Ht '5\' 4"'$  (1.626 m)   Wt  176 lb (79.8 kg)   SpO2 96%   BMI 30.21 kg/m     Wt Readings from Last 3  Encounters:  01/05/21 176 lb (79.8 kg)  09/27/20 170 lb (77.1 kg)  09/03/20 172 lb 12.8 oz (78.4 kg)     GEN:  Well nourished, well developed in no acute distress HEENT: Normal NECK: No JVD; No carotid bruits LYMPHATICS: No lymphadenopathy CARDIAC: RRR, no murmurs, no rubs, no gallops RESPIRATORY:  Clear to auscultation without rales, wheezing or rhonchi  ABDOMEN: Soft, non-tender, non-distended MUSCULOSKELETAL:  No edema; No deformity  SKIN: Warm and dry LOWER EXTREMITIES: no swelling NEUROLOGIC:  Alert and oriented x 3 PSYCHIATRIC:  Normal affect   ASSESSMENT:    1. Peripheral vascular disease, unspecified ([place]) bilateral carotic arterial disease   2. Essential hypertension   3. Atherosclerosis of native coronary artery of native heart without angina pectoris   4. Dyspnea on exertion   5. Abnormal stress test   6. Smoker    PLAN:    In order of problems listed above:  Peripheral vascular disease done to recheck her cardiac ultrasound.  She had 80% stenosis that need to be verified.  In the meantime we will continue antiplatelet therapy and statin therapy. Coronary artery disease completely asymptomatic.  We will continue present medications which include aspirin as well as statin. Dyslipidemia I did review her K PN which show me her LDL of 61 HDL 89 this is from 03/24/2020.  Past lipid profile will be checked today. Smoking obviously still a problem.  I did discuss with her need to quit she understands she will try to do that   Medication Adjustments/Labs and Tests Ordered: Current medicines are reviewed at length with the patient today.  Concerns regarding medicines are outlined above.  No orders of the defined types were placed in this encounter.  Medication changes: No orders of the defined types were placed in this encounter.   Signed, Park Liter, MD, [facility] 01/05/2021 9:52 AM    [place]

## 2021-01-05 NOTE — Addendum Note (Signed)
Addended by: Orvan July on: 01/05/2021 10:07 AM   Modules accepted: Orders

## 2021-01-05 NOTE — Patient Instructions (Signed)
Medication Instructions:  Your physician recommends that you continue on your current medications as directed. Please refer to the Current Medication list given to you today.  *If you need a refill on your cardiac medications before your next appointment, please call your pharmacy*   Lab Work: Your physician recommends that you return for lab work in:  TODAY: Direct LDL If you have labs (blood work) drawn today and your tests are completely normal, you will receive your results only by: [place] (if you have MyChart) OR A paper copy in the mail If you have any lab test that is abnormal or we need to change your treatment, we will call you to review the results.   Testing/Procedures: Your physician has requested that you have a carotid duplex. This test is an ultrasound of the carotid arteries in your neck. It looks at blood flow through these arteries that supply the brain with blood. Allow one hour for this exam. There are no restrictions or special instructions.    Follow-Up: At [facility], you and your health needs are our priority.  As part of our continuing mission to provide you with exceptional heart care, we have created designated Provider Care Teams.  These Care Teams include your primary Cardiologist (physician) and Advanced Practice Providers (APPs -  Physician Assistants and Nurse Practitioners) who all work together to provide you with the care you need, when you need it.  We recommend signing up for the patient portal called "MyChart".  Sign up information is provided on this After Visit Summary.  MyChart is used to connect with patients for Virtual Visits (Telemedicine).  Patients are able to view lab/test results, encounter notes, upcoming appointments, etc.  Non-urgent messages can be sent to your provider as well.   To learn more about what you can do with MyChart, go to [email or web address].    Your next appointment:   6 month(s)  The format for your  next appointment:   In Person  Provider:   Jenne Campus, MD   Other Instructions

## 2021-01-06 LAB — LDL CHOLESTEROL, DIRECT: LDL Direct: 46 mg/dL (ref 0–99)

## 2021-01-08 ENCOUNTER — Telehealth: Payer: Self-pay

## 2021-01-08 NOTE — Telephone Encounter (Signed)
Left message on patients voicemail to please return our call.   

## 2021-01-08 NOTE — Telephone Encounter (Signed)
Spoke with patient regarding results and recommendation.  Patient verbalizes understanding and is agreeable to plan of care. Advised patient to call back with any issues or concerns.  

## 2021-01-08 NOTE — Telephone Encounter (Signed)
Pt is returning call.  

## 2021-01-08 NOTE — Telephone Encounter (Signed)
-----   Message from Park Liter, MD sent at 01/08/2021  9:22 AM EDT ----- Cholesterol looks perfect, continue present management

## 2021-01-11 ENCOUNTER — Ambulatory Visit: Payer: Medicare PPO | Admitting: Family Medicine

## 2021-01-19 ENCOUNTER — Ambulatory Visit (INDEPENDENT_AMBULATORY_CARE_PROVIDER_SITE_OTHER): Payer: Medicare PPO | Admitting: Family Medicine

## 2021-01-19 ENCOUNTER — Encounter: Payer: Self-pay | Admitting: Family Medicine

## 2021-01-19 ENCOUNTER — Other Ambulatory Visit: Payer: Self-pay

## 2021-01-19 VITALS — BP 134/83 | HR 84 | Temp 98.8°F | Ht 64.0 in | Wt 175.0 lb

## 2021-01-19 DIAGNOSIS — Z23 Encounter for immunization: Secondary | ICD-10-CM | POA: Diagnosis not present

## 2021-01-19 DIAGNOSIS — R29898 Other symptoms and signs involving the musculoskeletal system: Secondary | ICD-10-CM | POA: Diagnosis not present

## 2021-01-19 DIAGNOSIS — R296 Repeated falls: Secondary | ICD-10-CM | POA: Diagnosis not present

## 2021-01-19 DIAGNOSIS — R2681 Unsteadiness on feet: Secondary | ICD-10-CM

## 2021-01-19 DIAGNOSIS — R269 Unspecified abnormalities of gait and mobility: Secondary | ICD-10-CM | POA: Diagnosis not present

## 2021-01-19 DIAGNOSIS — Z9181 History of falling: Secondary | ICD-10-CM

## 2021-01-19 LAB — COMPREHENSIVE METABOLIC PANEL
ALT: 19 U/L (ref 0–35)
AST: 22 U/L (ref 0–37)
Albumin: 4 g/dL (ref 3.5–5.2)
Alkaline Phosphatase: 42 U/L (ref 39–117)
BUN: 9 mg/dL (ref 6–23)
CO2: 26 mEq/L (ref 19–32)
Calcium: 9.3 mg/dL (ref 8.4–10.5)
Chloride: 106 mEq/L (ref 96–112)
Creatinine, Ser: 0.73 mg/dL (ref 0.40–1.20)
GFR: 80.88 mL/min (ref >60.00)
Glucose, Bld: 89 mg/dL (ref 70–99)
Potassium: 4.3 mEq/L (ref 3.5–5.1)
Sodium: 141 mEq/L (ref 135–145)
Total Bilirubin: 0.5 mg/dL (ref 0.2–1.2)
Total Protein: 6.1 g/dL (ref 6.0–8.3)

## 2021-01-19 LAB — CBC WITH DIFFERENTIAL/PLATELET
Basophils Absolute: 0.1 10*3/uL (ref 0.0–0.1)
Basophils Relative: 0.8 % (ref 0.0–3.0)
Eosinophils Absolute: 0.1 10*3/uL (ref 0.0–0.7)
Eosinophils Relative: 0.9 % (ref 0.0–5.0)
HCT: 43.5 % (ref 36.0–46.0)
Hemoglobin: 14.2 g/dL (ref 12.0–15.0)
Lymphocytes Relative: 20.8 % (ref 12.0–46.0)
Lymphs Abs: 1.4 10*3/uL (ref 0.7–4.0)
MCHC: 32.7 g/dL (ref 30.0–36.0)
MCV: 101.7 fl — ABNORMAL HIGH (ref 78.0–100.0)
Monocytes Absolute: 0.5 10*3/uL (ref 0.1–1.0)
Monocytes Relative: 7.2 % (ref 3.0–12.0)
Neutro Abs: 4.7 10*3/uL (ref 1.4–7.7)
Neutrophils Relative %: 70.3 % (ref 43.0–77.0)
Platelets: 213 10*3/uL (ref 150.0–400.0)
RBC: 4.28 Mil/uL (ref 3.87–5.11)
RDW: 13.3 % (ref 11.5–15.5)
WBC: 6.7 10*3/uL (ref 4.0–10.5)

## 2021-01-19 LAB — B12 AND FOLATE PANEL
Folate: 11.6 ng/mL (ref >5.9)
Vitamin B-12: >1550 pg/mL — ABNORMAL HIGH (ref 211–911)

## 2021-01-19 LAB — TSH: TSH: 0.57 u[IU]/mL (ref 0.35–5.50)

## 2021-01-19 LAB — T4, FREE: Free T4: 1.17 ng/dL (ref 0.60–1.60)

## 2021-01-19 LAB — CK: Total CK: 47 U/L (ref 7–177)

## 2021-01-19 LAB — VITAMIN D 25 HYDROXY (VIT D DEFICIENCY, FRACTURES): VITD: 44.98 ng/mL (ref 30.00–100.00)

## 2021-01-19 LAB — SEDIMENTATION RATE: Sed Rate: 28 mm/h (ref 0–30)

## 2021-01-19 NOTE — Progress Notes (Signed)
This visit occurred during the SARS-CoV-2 public health emergency.  Safety protocols were in place, including screening questions prior to the visit, additional usage of staff PPE, and extensive cleaning of exam room while observing appropriate contact time as indicated for disinfecting solutions.    Kendra Ball , [birth year], 75 y.o., female MRN: [id number] Patient Care Team    Relationship Specialty Notifications Start End  Ma Hillock, DO PCP - General Family Medicine  12/30/14   Juanita Craver, MD Consulting Physician Gastroenterology  05/09/14   Excell Seltzer, MD (Inactive) Consulting Physician General Surgery  05/09/14   Brandon Melnick, [place] (Inactive)  Psychology  10/16/15   [name], MD  Psychiatry  12/12/16   Ladell Pier, MD Consulting Physician Oncology  04/23/20     Chief Complaint  Patient presents with   Gait Problem    Pt c/o balances issues worsening in the last 6 mos; notice issue when changes positions or when walking up stairs     Subjective: Pt presents for an OV with complaints of noticing weakness in her lower extremities.  She states she has noticed weakness seems to be worsening over the last 6 months.  She reports her knees and legs feel weak when going up stairs or when changing positions such as standing.  She reports she walks 1 mile 3 times a week and has no problems when walking.  However when she gets to her car after walking she will feel very weak in her legs.  She reports sometimes the weakness is there and sometimes it is not, can occur a couple times a week.  She has had falls over the last 6 months.  She states one was when she stood up too fast and start walking. She has had B-cell lymphoma and chemo.  She has a history of osteoporosis, hypothyroidism COPD, coronary artery disease, PVD, iron deficiency potentially could be playing a role in her weakness.  Patient reports she is compliant with B12 supplement, iron, magnesium.  Depression  screen [facility] 2/9 01/19/2021 11/25/2020 03/24/2020 04/10/2018 02/16/2017  Decreased Interest 0 0 0 0 0  Down, Depressed, Hopeless 0 0 0 0 0  PHQ - 2 Score 0 0 0 0 0  Altered sleeping 0 - 0 0 -  Tired, decreased energy 2 - 0 2 -  Change in appetite 0 - 0 2 -  Feeling bad or failure about yourself  0 - 0 0 -  Trouble concentrating 0 - 0 2 -  Moving slowly or fidgety/restless 0 - 0 0 -  Suicidal thoughts 0 - 0 - -  PHQ-9 Score 2 - 0 6 -  Difficult doing work/chores - - - Not difficult at all -  Some encounter information is confidential and restricted. Go to Review Flowsheets activity to see all data.  Some recent data might be hidden    Allergies  Allergen Reactions   Morphine Sulfate Swelling and Other (See Comments)    Swelling around injection area   Social History   Social History Narrative   Ms. Pranger lives in [place] with her husband. She has 3 grown children and several grand children. She has joint custody of 3 of her grand children as her daughter has bipolar disorder and has needed assistance with children in past.   1-2 cups coffee, 3-6 cups tea per day.   Right-handed.   Past Medical History:  Diagnosis Date   Abnormal mammogram 10/01/2013   Diagnostic [facility]G &  [place] confirm cyst & duct ectasia of L breast. 08/2013.    Abnormal stress test 05/27/2020   Abnormal weight gain 05/05/2020   Adenopathy    right supraclavicular   Anxiety    Aortic atherosclerosis (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 RF: smoker, fam Hx, sedentary HDL is high!!    Dyspnea on exertion  05/08/2020   Essential hypertension 05/22/2014   Family history of malignant neoplasm of gastrointestinal tract 05/05/2020   Family history of premature coronary heart disease 08/13/2013   Father, brother, sister Personal risk factors: smoker, female over 63, fam Hx, sedentary, thyroid disease, dyslipidemia, BMI over 25.    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     Insomnia 05/05/2015   Iron deficiency anemia 05/05/2020   Lymphoma ([place]) 2019   B-cell   MDD (major depressive disorder), recurrent episode, moderate ([place]) 01/30/2014   [facility] 05/2015 08/18/2015:  Effexor XR 244m po qD for depression and anxiety Wellbutrin SR to 2037mpo BID for depression   Xanax 0.63m663mo BID and 1mg62m qHS prn anxiety       Routine PRN Medications:  Yes    Consultations: encouraged to f/up with PCP as needed, encouraged to start individual therapy   Safety Concerns:  Pt denies SI and is at an acute low risk for suicide.Patient told to call clinic i   Multiple fractures 01/29/2019   Obesity    Osteoporosis    Paresthesia 08/06/2019   Port-A-Cath in place 12/27/2017   Pulmonary nodule seen on imaging study 02/13/2015   Smoker 09/14/2015   [facility]   TOBACCO USER 12/25/2009   Tremor 05/23/2019   Past Surgical History:  Procedure Laterality Date   AXILLARY LYMPH NODE BIOPSY Right 06/22/2017   Procedure: RIGHT AXILLARY LYMPH NODE BIOPSY;  Surgeon: TsueDonnie Mesa;  Location: [facility];  Service: General;  Laterality: Right;   BARIATRIC SURGERY  2006   CESAREAN SECTION     COLON RESECTION N/A 05/09/2014    Procedure: LAPAROSCOPIC HAND-ASSISTED EXTENDED RIGHT COLECTOMY, LAPAROSCOPIC LYSIS OF ADHESIONS, SPLENIC FLEXURE MOBILIZATION.;  Surgeon: StevMichael Boston;  Location: [facility];  Service: General;  Laterality: N/A;   DILATION AND CURETTAGE OF UTERUS     x2   FOREARM FRACTURE SURGERY     right   GASTRIC BYPASS  2006   IR IMAGING GUIDED PORT INSERTION  12/01/2017   IR REMOVAL TUN ACCESS W/ PORT W/O FL MOD SED  07/03/2018   MASS EXCISION Right 02/24/2017   Procedure: EXCISIONAL BIOPSY RIGHT SUPRA-CLAVICULAR NODE;  Surgeon: WoliJodi Marble;  [facility];  Laterality: Right;   TONSILLECTOMY     Family History  Problem Relation Age of Onset   Cancer Mother        colon   Depression Mother    Alzheimer's disease Mother        Dementia   Heart disease Father 19   Heart disease Sister 11       stents, pacemaker   Depression Sister    Heart disease Brother 75       CABG, MI   Depression Brother    Obesity Daughter    Suicidality Daughter    Bipolar disorder Daughter    Obesity Son    Mental illness Daughter        bipolar   Obesity Daughter    Depression Sister    Dementia Paternal Aunt    Alcohol abuse Maternal Uncle    Colon cancer Maternal Aunt    Stomach cancer Maternal Grandfather    Allergies as of 01/19/2021       Reactions   Morphine Sulfate Swelling, Other (See Comments)   Swelling around injection area        Medication List        Accurate as of January 19, 2021 11:59 PM. If you have any questions, ask your nurse or doctor.          albuterol 108 (90 Base) MCG/ACT inhaler Commonly known as: VENTOLIN HFA Inhale 2 puffs into the lungs every 6 (six) hours as needed for wheezing or shortness of breath.   alendronate 70 MG tablet Commonly known as: FOSAMAX Take 1 tablet (70 mg total) by mouth every 7 (seven) days. Take with a full glass of water on an empty stomach.   aspirin EC 81 MG tablet Take 1 tablet (81 mg total)  by mouth daily. Swallow whole.   buPROPion 150 MG 12 hr tablet Commonly known as: WELLBUTRIN SR Take 1 tablet (150 mg total) by mouth 2 (two) times daily.   busPIRone 10 MG tablet Commonly known as: BUSPAR Take 1 tablet (10 mg total) by mouth 3 (three) times daily.   Cyanocobalamin 2500 MCG Subl 1 tab sublingual daily What changed:  how much to take how to take this when to take this   ferrous fumarate 325 (106 Fe) MG Tabs tablet Commonly known as: HEMOCYTE - 106 mg FE Take 1 tablet by mouth daily. Reported on 10/06/2015   levothyroxine 100 MCG tablet Commonly known as: SYNTHROID Take 1 tablet (100 mcg total) by mouth daily before breakfast.   Magnesium 250 MG Tabs Take 250 mg by mouth daily.   ranolazine 500 MG 12 hr tablet Commonly known as: RANEXA TAKE 1 TABLET BY MOUTH TWICE A DAY   rosuvastatin 10 MG tablet Commonly known as: CRESTOR TAKE 1 TABLET BY MOUTH EVERY DAY   traZODone 100 MG tablet Commonly known as: DESYREL Take 2 tablets (200 mg total) by mouth at bedtime.   venlafaxine XR 150 MG 24 hr capsule Commonly known as: EFFEXOR-XR Take 1 capsule (150 mg total) by mouth daily with breakfast.        All past medical history, surgical history, allergies, family history, immunizations andmedications were updated in the EMR today and reviewed under the history and medication portions of their EMR.     ROS: Negative, with the exception of above mentioned in HPI   Objective:  BP 134/83 (Patient Position: Standing)   Pulse 84   Temp 98.8 F (37.1 C) (Oral)  Ht _0  (1.626 m)   Wt 175 lb (79.4 kg)   SpO2 96%   BMI 30.04 kg/m  Body mass index is 30.04 kg/m. Gen: Afebrile. No acute distress. Nontoxic in appearance, well developed, well nourished.  Very pleasant, obese female. HENT: AT. [place].  Eyes:Pupils Equal Round Reactive to light, Extraocular movements intact,  Conjunctiva without redness, discharge or icterus. CV: RRR, no edema Chest: CTAB, no  wheeze or crackles. Good air movement, normal resp effort.  Skin: No rashes, purpura or petechiae.  Neuro: Normal gait. PERLA. EOMi. Alert. . Muscle strength 5/5 bilateral lower extremity with the exception of 4+/5 left foot plantar flexion and left lower extremity extension.  Extremity.  Psych: Normal affect, dress and demeanor. Normal speech. Normal thought content and judgment.  No results found. No results found. No results found for this or any previous visit (from the past 24 hour(s)).  Assessment/Plan: LYLIANA DICENSO is a 75 y.o. female present for OV for  Need for influenza vaccination - Flu Vaccine QUAD High Dose(Fluad)  Weakness of both lower extremities/gait instability-frequent falls//high risk for falls//morbid obesity Will rule out other potential causes of lower extremity weakness with laboratory collection today.  She does have some lower extremity weakness but is able to tolerate walking 1 mile a few times a day.  Suspect this may be due to de- conditioning versus neurological disorder.  If labs do not indicate cause, will refer to physical therapy as first step. - Iron, TIBC and Ferritin Panel - CBC w/Diff - Comp Met (CMET) - TSH - Vitamin D (25 hydroxy) - B12 and Folate Panel - Vitamin B1 - T4, free - Sedimentation rate - CK - Ambulatory referral to Physical Therapy   Reviewed expectations re: course of current medical issues. Discussed self-management of symptoms. Outlined signs and symptoms indicating need for more acute intervention. Patient verbalized understanding and all questions were answered. Patient received an After-Visit Summary.    Orders Placed This Encounter  Procedures   Flu Vaccine QUAD High Dose(Fluad)   Iron, TIBC and Ferritin Panel   CBC w/Diff   Comp Met (CMET)   TSH   Vitamin D (25 hydroxy)   B12 and Folate Panel   Vitamin B1   T4, free   Sedimentation rate   CK   Ambulatory referral to Physical Therapy   No orders of  the defined types were placed in this encounter.  Referral Orders         Ambulatory referral to Physical Therapy       Note is dictated utilizing voice recognition software. Although note has been proof read prior to signing, occasional typographical errors still can be missed. If any questions arise, please do not hesitate to call for verification.   electronically signed by:  Howard Pouch, DO  [place]

## 2021-01-19 NOTE — Patient Instructions (Signed)
Great to see you today.  I have refilled the medication(s) we provide.   If labs were collected, we will inform you of lab results once received either by echart message or telephone call.   - echart message- for normal results that have been seen by the patient already.   - telephone call: abnormal results or if patient has not viewed results in their echart.  

## 2021-01-20 ENCOUNTER — Other Ambulatory Visit: Payer: Self-pay | Admitting: Family Medicine

## 2021-01-20 MED ORDER — LEVOTHYROXINE SODIUM 100 MCG PO TABS: 100.0000 ug | ORAL_TABLET | Freq: Every day | ORAL | 3 refills | Status: DC

## 2021-01-21 ENCOUNTER — Encounter: Payer: Self-pay | Admitting: Family Medicine

## 2021-01-22 ENCOUNTER — Other Ambulatory Visit: Payer: Self-pay

## 2021-01-22 ENCOUNTER — Ambulatory Visit (HOSPITAL_BASED_OUTPATIENT_CLINIC_OR_DEPARTMENT_OTHER)
Admission: RE | Admit: 2021-01-22 | Discharge: 2021-01-22 | Disposition: A | Discharge status: Home / Self Care | Payer: Medicare PPO | Source: Ambulatory Visit | Attending: Cardiology | Admitting: Cardiology

## 2021-01-22 DIAGNOSIS — I779 Disorder of arteries and arterioles, unspecified: Secondary | ICD-10-CM | POA: Insufficient documentation

## 2021-01-22 DIAGNOSIS — I6523 Occlusion and stenosis of bilateral carotid arteries: Secondary | ICD-10-CM

## 2021-01-23 LAB — IRON,TIBC AND FERRITIN PANEL
%SAT: 36 % (calc) (ref 16–45)
Ferritin: 106 ng/mL (ref 16–288)
Iron: 123 ug/dL (ref 45–160)
TIBC: 340 mcg/dL (calc) (ref 250–450)

## 2021-01-23 LAB — VITAMIN B1: Vitamin B1 (Thiamine): 31 nmol/L — ABNORMAL HIGH (ref 8–30)

## 2021-01-27 ENCOUNTER — Other Ambulatory Visit: Payer: Self-pay

## 2021-01-27 ENCOUNTER — Ambulatory Visit (HOSPITAL_BASED_OUTPATIENT_CLINIC_OR_DEPARTMENT_OTHER): Payer: Medicare PPO | Attending: Family Medicine | Admitting: Physical Therapy

## 2021-01-27 ENCOUNTER — Encounter (HOSPITAL_BASED_OUTPATIENT_CLINIC_OR_DEPARTMENT_OTHER): Payer: Self-pay | Admitting: Physical Therapy

## 2021-01-27 DIAGNOSIS — R296 Repeated falls: Secondary | ICD-10-CM | POA: Diagnosis not present

## 2021-01-27 DIAGNOSIS — R278 Other lack of coordination: Secondary | ICD-10-CM | POA: Diagnosis not present

## 2021-01-27 DIAGNOSIS — M6281 Muscle weakness (generalized): Secondary | ICD-10-CM | POA: Insufficient documentation

## 2021-01-27 NOTE — Therapy (Signed)
OUTPATIENT PHYSICAL THERAPY LOWER EXTREMITY EVALUATION   Patient Name: Kendra Ball MRN: [id number] DOB:1945/10/09, 75 y.o., female Today's Date: 01/27/2021   PT End of Session - 01/27/21 0854     Visit Number 1    Number of Visits 12    Date for PT Re-Evaluation 03/10/21    Authorization Type [employer]    Progress Note Due on Visit --   02/27/2021   PT Start Time 0853    PT Stop Time 0938    PT Time Calculation (min) 45 min    Activity Tolerance Patient tolerated treatment well    Behavior During Therapy [facility] for tasks assessed/performed             Past Medical History:  Diagnosis Date   Abnormal mammogram 10/01/2013   Diagnostic MMG & [place] confirm cyst & duct ectasia of L breast. 08/2013.    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 RF: smoker, fam Hx, sedentary HDL is high!!    Dyspnea on exertion 05/08/2020   Essential hypertension 05/22/2014   Family history of malignant neoplasm of gastrointestinal tract 05/05/2020   Family history of malignant neoplasm of gastrointestinal tract 05/05/2020   Family history of premature coronary heart disease 08/13/2013   Father, brother, sister Personal risk factors: smoker, female over 71, fam Hx, sedentary, thyroid disease, dyslipidemia, BMI over 25.    GAD (generalized anxiety disorder) 01/30/2014   Gait abnormality 08/06/2019   H/O malignant lymphoma 05/23/2019   Headache    History of  colon polyps 10/13/2015   History of gastric bypass 09/10/2014   Hx of colonic polyps    First noted on  colonoscopy 2012   Hypercholesteremia    HYPERTENSION 08/26/2008   HYPOTHYROIDISM 08/26/2008   Hypothyroidism 08/26/2008   Current meds: levothyroxine 100 mcg qd.     Insomnia 05/05/2015   Iron deficiency anemia 05/05/2020   Lymphoma ([place]) 2019   B-cell   MDD (major depressive disorder), recurrent episode, moderate ([place]) 01/30/2014   [facility] 05/2015 08/18/2015:  Effexor XR 225mg  po qD for depression and anxiety Wellbutrin SR to 200mg  po BID for depression   Xanax 0.5mg  po BID and 1mg  po qHS prn anxiety       Routine PRN Medications:  Yes    Consultations: encouraged to f/up with PCP as needed, encouraged to start individual therapy   Safety Concerns:  Pt denies SI and is at an acute low risk for suicide.Patient told to call clinic i   Multiple fractures 01/29/2019   Obesity    Osteoporosis    Paresthesia 08/06/2019   Port-A-Cath in place 12/27/2017   Pulmonary nodule seen on imaging study 02/13/2015   Smoker 09/14/2015   Substance abuse [facility])    inpatient and outpatient tx for substance abuse   TOBACCO USER 12/25/2009   Tremor 05/23/2019   Past Surgical History:  Procedure Laterality Date   AXILLARY LYMPH NODE BIOPSY  Right 06/22/2017   Procedure: RIGHT AXILLARY LYMPH NODE BIOPSY;  Surgeon: [name], MD;  Location: [facility];  Service: General;  Laterality: Right;   BARIATRIC SURGERY  2006   CESAREAN SECTION     COLON RESECTION N/A 05/09/2014   Procedure: LAPAROSCOPIC HAND-ASSISTED EXTENDED RIGHT COLECTOMY, LAPAROSCOPIC LYSIS OF ADHESIONS, SPLENIC FLEXURE MOBILIZATION.;  Surgeon: [name], MD;  Location: [facility];  Service: General;  Laterality: N/A;   DILATION AND CURETTAGE OF UTERUS     x2   FOREARM FRACTURE SURGERY     right   GASTRIC BYPASS  2006   IR IMAGING GUIDED PORT INSERTION  12/01/2017   IR REMOVAL TUN ACCESS W/ PORT W/O FL MOD SED  07/03/2018   MASS EXCISION Right 02/24/2017    Procedure: EXCISIONAL BIOPSY RIGHT SUPRA-CLAVICULAR NODE;  Surgeon: [name], MD;  Location: [facility];  Service: ENT;  Laterality: Right;   TONSILLECTOMY     Patient Active Problem List   Diagnosis Date Noted   Morbid obesity ([place]) 01/21/2021   Peripheral vascular disease, unspecified ([place]) bilateral carotic arterial disease 07/01/2020   Abnormal stress test 05/27/2020   Substance abuse ([place])    Hx of colonic polyps    Arthritis    Carotid bruit 05/08/2020   Iron deficiency anemia 05/05/2020   Aortic atherosclerosis ([place]) 05/05/2020   Gait abnormality 08/06/2019   Tremor 05/23/2019   H/O malignant lymphoma 05/23/2019   At high risk for falls 01/29/2019   Multiple fractures 01/29/2019   Port-A-Cath in place 12/27/2017   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   Coronary atherosclerosis 10/22/2015   History of colon polyps 10/13/2015   Chronic diarrhea 10/13/2015   Insomnia 05/05/2015   COPD (chronic obstructive pulmonary disease) with chronic bronchitis ([place]) 02/13/2015   Pulmonary nodule seen on imaging study 02/13/2015   Osteoporosis 11/11/2014   History of gastric bypass 09/10/2014   Essential hypertension 05/22/2014   GAD (generalized anxiety disorder) 01/30/2014   MDD (major depressive disorder), recurrent episode, moderate ([place]) 01/30/2014   Dyslipidemia 10/03/2013   Abnormal mammogram 10/01/2013   Family history of premature coronary heart disease 08/13/2013   TOBACCO USER 12/25/2009   Hypothyroidism 08/26/2008    PCP: Ma Hillock, DO  REFERRING PROVIDER: Ma Hillock, DO  REFERRING DIAG: R29.898 (ICD-10-CM) - Weakness of both lower extremities  R26.81 (ICD-10-CM) - Gait instability  R29.6 (ICD-10-CM) - Frequent falls   THERAPY DIAG:  Muscle weakness Lack of coordination  ONSET DATE: March 2022  SUBJECTIVE:   SUBJECTIVE STATEMENT: Pt reports LE weakness and falling has been worsening over the past 6 months.  She denies  any specific reason. Pt states she has fallen when she changes positions including standing up from a chair or getting up from the floor.  Pt has weakness ascending steps and requires a rail.  Pt has difficulty with performing stairs and performs a step to gait pattern.  Pt denies any limitations with performing household chores. Pt walks 1 mile 3 times per week with her daughter.  She feels fine walking though feels unbalanced when getting in/out of car after stopping walking.  She states it doesn't last long.  Pt saw MD on 01/19/2021.  MD took bloodwork and pt states everything looked fine.  She ordered PT and PT referral indicated muscle strengthening and lower extremity weakness/deconditioning.     PERTINENT HISTORY: B-cell lymphoma and chemo approx 3 years ago.  Pt is in remission. osteoporosis, COPD, coronary artery disease, PVD,  iron deficiency potentially could be playing a role in her weakness. morbid obesity. Gastric bypass surgery in 2006  PAIN:  Are you having pain? No   PRECAUTIONS: Other: osteoporosis  WEIGHT BEARING RESTRICTIONS No  FALLS:  Has patient fallen in last 6 months? Yes, Number of falls: 5  LIVING ENVIRONMENT: Lives with: lives with their spouse Lives in: House/apartment Stairs: one floor home with 2 steps to enter home and a post to hold onto   OCCUPATION: Pt is retired  PLOF: Independent.  Pt was able to perform transfers without instability and falling  PATIENT GOALS improve balance, to not have any falls, reduce progression of weakness   OBJECTIVE:   DIAGNOSTIC FINDINGS: no imaging performed.  Pt states her bloodwork was fine.  PATIENT SURVEYS:  ABC scale 36%  indicating increased risk of falls  COGNITION:  Overall cognitive status: Within functional limits for tasks assessed      LE MMT:  MMT Right 01/27/2021 Left 01/27/2021  Hip flexion 4+/5 4+/5  Hip extension    Hip abduction 4-/5 4-/5  Hip adduction    Hip internal rotation    Hip  external rotation 4/5 4/5  Knee flexion 5/5 5/5  Knee extension 4/5 4+/5  Ankle dorsiflexion    Ankle plantarflexion    Ankle inversion    Ankle eversion     (Blank rows = not tested)    Balance:  Pt able to perform standing with FT x 30 sec without UE assistance though was shaky with increased sway. Pt unable to attain tandem stance without UE assistance and unable to maintain tandem stance.    FUNCTIONAL TESTS:  Pt able to perform sit to stands without using UEs. 5 times sit to stand: 17 seconds without UEs   GAIT: Assistive device utilized: None Comments:  Pt ambulated with good and symmetrical step length bilat.  She was steady ambulating having no LOB except being a little unsteady when turning fast.     TODAY'S TREATMENT: See pt education below.   PATIENT EDUCATION:  Education details: dx, POC, rehab process, and objective findings.  Answered Pt's questions. Person educated: Patient Education method: Explanation Education comprehension: verbalized understanding   HOME EXERCISE PROGRAM: Give next visit.  ASSESSMENT:  CLINICAL IMPRESSION: Patient is a 75 y.o. female with a dx of weakness of both lower extremities, gait instability, and frequent falls presenting to the clinic with bilat LE weakness in hips and knees and balance deficits.  Pt reports having weakness and falling worse over the past 6 months.  Pt ambulated well in the clinic and feels fine ambulating 1 mile with daughter though has instability with getting in/out of car after walking.  Pt has instability with changing positions including transfers and has difficulty with performing stairs.  Objective impairments include decreased activity tolerance, decreased balance, decreased mobility, and decreased strength. These impairments are limiting patient from  performing safe transfers, stairs, and community activity . Personal factors including Age and 3+ comorbidities: Hx of B-cell lymphoma and in remission,  osteoporosis, COPD, coronary artery disease, and PVD.  are also affecting patient's functional outcome. Patient should benefit from skilled PT to address above impairments and improve overall function.  REHAB POTENTIAL: Good  CLINICAL DECISION MAKING: Evolving/moderate complexity  EVALUATION COMPLEXITY: Moderate   GOALS:   SHORT TERM GOALS:  STG Name Target Date Goal status  1 Pt will be independent and compliant with HEP for improved strength, balance, and function.  Baseline:  02/17/2021 INITIAL  2 Pt  will be able to perform standing with FT on airex pad x 30 sec without UE assistance without LOB for improved balance and stability  Baseline:  02/17/2021 INITIAL  3 Pt will report at least a 30% improvement in sx's and stability with daily mobility.  Baseline: 02/17/2021 INITIAL  4 Pt will improve ABC scale by at least 20% for improved self perceived disability with balance confidence.  Baseline: 02/24/2021 INITIAL  LONG TERM GOALS:   LTG Name Target Date Goal status  1 Pt will be able to maintain tandem stance for 20 sec without UE assistance for improved balance and stability.  Baseline: 03/10/2021 INITIAL  2 Pt will deny having falls and report good stability with transfers.  Baseline: 03/10/2021 INITIAL  3 Pt will demo improved hip and knee strength to at least 4+ to 5/5 bilat except 5/5 in hip flexion and 5/5 in L knee extension for improved tolerance to activity and functional mobility.  Baseline: 03/10/2021 INITIAL  4 Pt will be able to perform a reciprocal gait on stairs with the usage of the rail.   Baseline: 03/10/2021 INITIAL                  PLAN: PT FREQUENCY: 2x/week  PT DURATION: 6 weeks  PLANNED INTERVENTIONS: Therapeutic exercises, Therapeutic activity, Neuro Muscular re-education, Balance training, Gait training, Patient/Family education, Stair training, and Aquatic Therapy  PLAN FOR NEXT SESSION: Give HEP.  Attempt supine SLR, supine bridge, seated LAQ,  standing with FT, and standing in staggered stance, and standing heel raises.  Perform Berg balance assessment.   Selinda Michaels III PT, DPT 01/27/21 10:36 PM

## 2021-01-28 ENCOUNTER — Encounter (HOSPITAL_COMMUNITY): Payer: Self-pay | Admitting: Psychiatry

## 2021-01-28 ENCOUNTER — Telehealth (HOSPITAL_BASED_OUTPATIENT_CLINIC_OR_DEPARTMENT_OTHER): Payer: Medicare PPO | Admitting: Psychiatry

## 2021-01-28 DIAGNOSIS — F331 Major depressive disorder, recurrent, moderate: Secondary | ICD-10-CM | POA: Diagnosis not present

## 2021-01-28 DIAGNOSIS — F5105 Insomnia due to other mental disorder: Secondary | ICD-10-CM

## 2021-01-28 DIAGNOSIS — F1020 Alcohol dependence, uncomplicated: Secondary | ICD-10-CM

## 2021-01-28 DIAGNOSIS — F17200 Nicotine dependence, unspecified, uncomplicated: Secondary | ICD-10-CM | POA: Diagnosis not present

## 2021-01-28 DIAGNOSIS — F99 Mental disorder, not otherwise specified: Secondary | ICD-10-CM

## 2021-01-28 DIAGNOSIS — F411 Generalized anxiety disorder: Secondary | ICD-10-CM

## 2021-01-28 MED ORDER — TRAZODONE HCL 100 MG PO TABS: 200.0000 mg | ORAL_TABLET | Freq: Every day | ORAL | 0 refills | Status: DC

## 2021-01-28 MED ORDER — BUSPIRONE HCL 10 MG PO TABS: 10.0000 mg | ORAL_TABLET | Freq: Three times a day (TID) | ORAL | 0 refills | Status: DC

## 2021-01-28 MED ORDER — VENLAFAXINE HCL ER 150 MG PO CP24: 150.0000 mg | ORAL_CAPSULE | Freq: Every day | ORAL | 0 refills | Status: DC

## 2021-01-28 MED ORDER — BUPROPION HCL ER (SR) 150 MG PO TB12: 150.0000 mg | ORAL_TABLET | Freq: Two times a day (BID) | ORAL | 0 refills | Status: DC

## 2021-01-28 NOTE — Progress Notes (Signed)
Virtual Visit via Telephone Note  I connected with Kendra Ball on 01/28/21 at  4:30 PM EDT by telephone and verified that I am speaking with the correct person using two identifiers.  Location: Patient: home Provider: office   I discussed the limitations, risks, security and privacy concerns of performing an evaluation and management service by telephone and the availability of in person appointments. I also discussed with the patient that there may be a patient responsible charge related to this service. The patient expressed understanding and agreed to proceed.   History of Present Illness: "I'm am doing good". She is happy because fall is her favorite time of the year. Kendra Ball is having some balance issues and has fallen a few times this year. Her PCP is sending her to PT starting next week. Her sleep and energy is good. Her appetite is unchanged but she finds she eats if she is sitting. Kendra Ball has gained 10 lbs in 2 months. [name] denies depression and negative self talk. She denies isolation and is spending several days a week at her daughter's house. She has also been doing projects around the house and is engaging in her hobbies of card making. Kendra Ball denies anxiety. She is still smoking 1ppd. Kendra Ball denies any alcohol use. She denies SI/HI.    Observations/Objective:  General Appearance: unable to assess  Eye Contact:  unable to assess  Speech:  Clear and Coherent and Normal Rate  Volume:  Normal  Mood:  Euthymic  Affect:  Full Range  Thought Process:  Goal Directed, Linear, and Descriptions of Associations: Intact  Orientation:  Full (Time, Place, and Person)  Thought Content:  Logical  Suicidal Thoughts:  No  Homicidal Thoughts:  No  Memory:  Immediate;   Good  Judgement:  Good  Insight:  Good  Psychomotor Activity: unable to assess  Concentration:  Concentration: Good  Recall:  Good  Fund of Knowledge:  Good  Language:  Good  Akathisia:  unable to assess   Handed:  unable to assess  AIMS (if indicated):     Assets:  Communication Skills Desire for Improvement Financial Resources/Insurance Housing Resilience Social Support Talents/Skills Transportation Vocational/Educational  ADL's:  unable to assess  Cognition:  WNL  Sleep:        Assessment and Plan: Depression screen [facility] 2/9 01/28/2021 01/19/2021 11/25/2020 11/05/2020 08/13/2020  Decreased Interest 0 0 0 0 0  Down, Depressed, Hopeless 0 0 0 0 0  PHQ - 2 Score 0 0 0 0 0  Altered sleeping 0 0 - - -  Tired, decreased energy 0 2 - - -  Change in appetite 2 0 - - -  Feeling bad or failure about yourself  0 0 - - -  Trouble concentrating 0 0 - - -  Moving slowly or fidgety/restless 0 0 - - -  Suicidal thoughts 0 0 - - -  PHQ-9 Score 2 2 - - -  Difficult doing work/chores - - - - -  Some recent data might be hidden    Flowsheet Row Video Visit from 01/28/2021 in [facility] Video Visit from 11/05/2020 in [facility] ED from 09/27/2020 in [place] No Risk No Risk No Risk      1. MDD (major depressive disorder), recurrent episode, moderate (HCC) - busPIRone (BUSPAR) 10 MG tablet; Take 1 tablet (10 mg total) by mouth 3 (three) times daily.  Dispense: 270 tablet; Refill:  0 - traZODone (DESYREL) 100 MG tablet; Take 2 tablets (200 mg total) by mouth at bedtime.  Dispense: 180 tablet; Refill: 0 - venlafaxine XR (EFFEXOR-XR) 150 MG 24 hr capsule; Take 1 capsule (150 mg total) by mouth daily with breakfast.  Dispense: 90 capsule; Refill: 0  2. GAD (generalized anxiety disorder) - busPIRone (BUSPAR) 10 MG tablet; Take 1 tablet (10 mg total) by mouth 3 (three) times daily.  Dispense: 270 tablet; Refill: 0 - venlafaxine XR (EFFEXOR-XR) 150 MG 24 hr capsule; Take 1 capsule (150 mg total) by mouth daily with breakfast.  Dispense: 90 capsule; Refill: 0  3. Insomnia  due to other mental disorder - traZODone (DESYREL) 100 MG tablet; Take 2 tablets (200 mg total) by mouth at bedtime.  Dispense: 180 tablet; Refill: 0  4. Nicotine use disorder - buPROPion (WELLBUTRIN SR) 150 MG 12 hr tablet; Take 1 tablet (150 mg total) by mouth 2 (two) times daily.  Dispense: 180 tablet; Refill: 0  5. Alcohol use disorder, moderate, dependence (HCC) - busPIRone (BUSPAR) 10 MG tablet; Take 1 tablet (10 mg total) by mouth 3 (three) times daily.  Dispense: 270 tablet; Refill: 0 - traZODone (DESYREL) 100 MG tablet; Take 2 tablets (200 mg total) by mouth at bedtime.  Dispense: 180 tablet; Refill: 0   Follow Up Instructions: In 2-3 months or sooner if needed   I discussed the assessment and treatment plan with the patient. The patient was provided an opportunity to ask questions and all were answered. The patient agreed with the plan and demonstrated an understanding of the instructions.   The patient was advised to call back or seek an in-person evaluation if the symptoms worsen or if the condition fails to improve as anticipated.  I provided 11 minutes of non-face-to-face time during this encounter.   [name], MD

## 2021-02-02 ENCOUNTER — Ambulatory Visit (HOSPITAL_BASED_OUTPATIENT_CLINIC_OR_DEPARTMENT_OTHER): Payer: Medicare PPO | Admitting: Physical Therapy

## 2021-02-02 ENCOUNTER — Encounter (HOSPITAL_BASED_OUTPATIENT_CLINIC_OR_DEPARTMENT_OTHER): Payer: Self-pay | Admitting: Physical Therapy

## 2021-02-02 ENCOUNTER — Other Ambulatory Visit: Payer: Self-pay

## 2021-02-02 DIAGNOSIS — M6281 Muscle weakness (generalized): Secondary | ICD-10-CM

## 2021-02-02 DIAGNOSIS — R296 Repeated falls: Secondary | ICD-10-CM | POA: Diagnosis not present

## 2021-02-02 DIAGNOSIS — R278 Other lack of coordination: Secondary | ICD-10-CM | POA: Diagnosis not present

## 2021-02-02 NOTE — Therapy (Signed)
OUTPATIENT PHYSICAL THERAPY TREATMENT NOTE   Patient Name: Kendra Ball MRN: [id number] DOB:[birth year], 75 y.o., female Today's Date: 02/02/2021  PCP: Ma Hillock, DO REFERRING PROVIDER: Ma Hillock, DO   PT End of Session - 02/02/21 0853     Visit Number 2    Number of Visits 12    Date for PT Re-Evaluation 03/10/21    Authorization Type [employer]    Progress Note Due on Visit --   02/27/2021   PT Start Time 0851    PT Stop Time 0934    PT Time Calculation (min) 43 min    Activity Tolerance Patient tolerated treatment well    Behavior During Therapy [facility] for tasks assessed/performed             Past Medical History:  Diagnosis Date   Abnormal mammogram 10/01/2013   Diagnostic MMG & [place] confirm cyst & duct ectasia of L breast. 08/2013.    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 RF: smoker, fam Hx, sedentary HDL is high!!    Dyspnea on exertion 05/08/2020   Essential hypertension 05/22/2014   Family history of malignant neoplasm of gastrointestinal tract 05/05/2020   Family history of malignant neoplasm of gastrointestinal tract 05/05/2020   Family history of premature coronary heart disease 08/13/2013   Father, brother, sister Personal risk factors: smoker, female over 87, fam Hx, sedentary, thyroid disease, dyslipidemia, BMI over 25.    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     Insomnia 05/05/2015   Iron deficiency anemia 05/05/2020   Lymphoma ([place]) 2019   B-cell   MDD (major depressive disorder), recurrent episode, moderate ([place]) 01/30/2014   [facility] 05/2015 08/18/2015:  Effexor XR 225mg  po qD for depression and anxiety Wellbutrin SR to 200mg  po BID for depression   Xanax 0.5mg  po BID and 1mg  po qHS prn anxiety       Routine PRN Medications:  Yes    Consultations: encouraged to f/up with PCP as needed, encouraged to start individual therapy   Safety Concerns:  Pt denies SI and is at an acute low risk for suicide.Patient told to call clinic i   Multiple fractures 01/29/2019   Obesity    Osteoporosis    Paresthesia 08/06/2019   Port-A-Cath in place 12/27/2017   Pulmonary nodule seen on imaging study 02/13/2015   Smoker 09/14/2015   Substance abuse [facility])    inpatient and outpatient tx for substance abuse   TOBACCO USER 12/25/2009   Tremor 05/23/2019   Past Surgical  History:  Procedure Laterality Date   AXILLARY LYMPH NODE BIOPSY Right 06/22/2017   Procedure: RIGHT AXILLARY LYMPH NODE BIOPSY;  Surgeon: [name], MD;  [facility]: [facility];  Service: General;  Laterality: Right;   BARIATRIC SURGERY  2006   CESAREAN SECTION     COLON RESECTION N/A 05/09/2014   Procedure: LAPAROSCOPIC HAND-ASSISTED EXTENDED RIGHT COLECTOMY, LAPAROSCOPIC LYSIS OF ADHESIONS, SPLENIC FLEXURE MOBILIZATION.;  Surgeon: [name], MD;  [facility];  Service: General;  Laterality: N/A;   DILATION AND CURETTAGE OF UTERUS     x2   FOREARM FRACTURE SURGERY     right   GASTRIC BYPASS  2006   IR IMAGING GUIDED PORT INSERTION  12/01/2017   IR REMOVAL TUN ACCESS W/ PORT W/O FL MOD SED   07/03/2018   MASS EXCISION Right 02/24/2017   Procedure: EXCISIONAL BIOPSY RIGHT SUPRA-CLAVICULAR NODE;  Surgeon: [name], MD;  Location: [place];  Service: ENT;  Laterality: Right;   TONSILLECTOMY     Patient Active Problem List   Diagnosis Date Noted   Morbid obesity ([place]) 01/21/2021   Peripheral vascular disease, unspecified ([place]) bilateral carotic arterial disease 07/01/2020   Abnormal stress test 05/27/2020   Substance abuse ([place])    Hx of colonic polyps    Arthritis    Carotid bruit 05/08/2020   Iron deficiency anemia 05/05/2020   Aortic atherosclerosis ([place]) 05/05/2020   Gait abnormality 08/06/2019   Tremor 05/23/2019   H/O malignant lymphoma 05/23/2019   At high risk for falls 01/29/2019   Multiple fractures 01/29/2019   Port-A-Cath in place 12/27/2017   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   Coronary atherosclerosis 10/22/2015   History of colon polyps 10/13/2015   Chronic diarrhea 10/13/2015   Insomnia 05/05/2015   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/2015   Pulmonary nodule seen on imaging study 02/13/2015   Osteoporosis 11/11/2014   History of gastric bypass 09/10/2014   Essential hypertension 05/22/2014   GAD (generalized anxiety disorder) 01/30/2014   MDD (major depressive disorder), recurrent episode, moderate ([place]) 01/30/2014   Dyslipidemia 10/03/2013   Abnormal mammogram 10/01/2013   Family history of premature coronary heart disease 08/13/2013   TOBACCO USER 12/25/2009   Hypothyroidism 08/26/2008    REFERRING PROVIDER: Ma Hillock, DO   REFERRING DIAG: R29.898 (ICD-10-CM) - Weakness of both lower extremities  R26.81 (ICD-10-CM) - Gait instability  R29.6 (ICD-10-CM) - Frequent falls    THERAPY DIAG:  Muscle weakness Lack of coordination   ONSET DATE: March 2022   SUBJECTIVE:    SUBJECTIVE STATEMENT: -Pt reports LE weakness and falling has been worsening over the  past 6 months.  She denies any specific reason. Pt states she has fallen when she changes positions including standing up from a chair or getting up from the floor.  Pt has weakness ascending steps and requires a rail.  Pt has difficulty with performing stairs and performs a step to gait pattern.  Pt denies any limitations with performing household chores. Pt walks 1 mile 3 times per week with her daughter.  She feels fine walking though feels unbalanced when getting in/out of car after stopping walking.  She states it doesn't last long. Pt reports she walked 1 mile yesterday performing her walking program.  She didn't feel unbalanced when getting in/out of car after walking like she typically does.  Pt states she feels wobbly when she straightens up after bending over to pick up an object.  Pt denies any adverse effects after prior Rx.    PERTINENT HISTORY: B-cell lymphoma and chemo approx 3 years ago.  Pt is in remission. osteoporosis, COPD, coronary artery disease, PVD, iron deficiency potentially could be playing a role in her weakness. morbid obesity. Gastric bypass surgery in 2006   PAIN:  Are you having pain? No, just aching in lower back which can occur from walking or mopping the kitchen     PRECAUTIONS: Other: osteoporosis     FALLS:  Has patient fallen in last 6 months? Yes, Number of falls: 5     PATIENT GOALS improve balance, to not have any falls, reduce progression of weakness     OBJECTIVE:     Therapeutic Exercise:    -Reviewed response to prior Rx, pain level, and current function.  -Pt performed:   -supine SLR 2x10 reps   -Supine bridge 2x10 reps     -supine clams with RTB 2x10   -S/L hip abduction 2x10 reps   -Seated LAQ x 10-12 reps for 2 sets AROM and 1x10 with RTB   -Standing with FT without UE assist 3x30 sec   -standing in staggered stance 1x20 sec each with CGA/min A and occasional UE assist  -Gave pt a HEP handout and educated pt in correct form and  appropriate frequency           PATIENT EDUCATION:  Education details:  Answered Pt's questions.  Pt received a HEP handout and was educated in correct form and appropriate frequency.  Instructed pt she should not have pain with HEP and to stop exercises if she does have pain. Person educated: Patient Education method: Explanation, demonstration, verbal and tactile cues, handout Education comprehension: verbalized understanding, returned demonstration     HOME EXERCISE PROGRAM: Access Code: [email or web address] URL: [email or web address] Date: 02/02/2021 Prepared by: Ronny Flurry  Exercises Supine Active Straight Leg Raise - 1 x daily - 5 x weekly - 2 sets - 10 reps Supine Bridge - 1 x daily - 5 x weekly - 2 sets - 10 reps Hooklying Clamshell with Resistance - 1 x daily - 3-4 x weekly - 2 sets - 10 reps Romberg Stance - 1 x daily - 6-7 x weekly - 1 sets - 2-3 reps - 30 seconds hold     ASSESSMENT:   CLINICAL IMPRESSION: Pt continues to perform her walking program and reported not having the sx's after walking program yesterday.  Pt performed exercises well with cuing and instruction in correct form and positioning.  Pt demonstrated more weakness with hip abd and knee extension on R LE.  Established initial HEP and educated Pt.  Pt received a handout and demonstrates good understanding.  Pt had no c/o's during Rx.  She responded well to Rx and had no pain after Rx.  Patient should benefit from skilled PT to address above impairments and improve overall function. Objective impairments include decreased activity tolerance, decreased balance, decreased mobility, and decreased strength. These impairments are limiting patient from  performing safe transfers, stairs, and community activity . Personal factors including Age and 3+ comorbidities: Hx of B-cell lymphoma and in remission, osteoporosis, COPD, coronary artery disease, and PVD.  are also affecting patient's functional outcome.        GOALS:     SHORT TERM GOALS:   STG Name Target Date Goal status  1 Pt will be independent and compliant with HEP for improved strength, balance, and function.  Baseline:  02/17/2021 INITIAL  2 Pt will be  able to perform standing with FT on airex pad x 30 sec without UE assistance without LOB for improved balance and stability  Baseline:  02/17/2021 INITIAL  3 Pt will report at least a 30% improvement in sx's and stability with daily mobility.  Baseline: 02/17/2021 INITIAL  4 Pt will improve ABC scale by at least 20% for improved self perceived disability with balance confidence.  Baseline: 02/24/2021 INITIAL  LONG TERM GOALS:    LTG Name Target Date Goal status  1 Pt will be able to maintain tandem stance for 20 sec without UE assistance for improved balance and stability.  Baseline: 03/10/2021 INITIAL  2 Pt will deny having falls and report good stability with transfers.  Baseline: 03/10/2021 INITIAL  3 Pt will demo improved hip and knee strength to at least 4+ to 5/5 bilat except 5/5 in hip flexion and 5/5 in L knee extension for improved tolerance to activity and functional mobility.  Baseline: 03/10/2021 INITIAL  4 Pt will be able to perform a reciprocal gait on stairs with the usage of the rail.   Baseline: 03/10/2021 INITIAL                               PLAN:    PLANNED INTERVENTIONS: Therapeutic exercises, Therapeutic activity, Neuro Muscular re-education, Balance training, Gait training, Patient/Family education, Stair training, and Aquatic Therapy   PLAN FOR NEXT SESSION: Review and perform HEP.  Cont with therapeutic exercise and neuro re-education activities.  Attempt standing heel raises.  Perform Berg balance assessment.    Selinda Michaels III PT, DPT 02/02/21 8:03 PM

## 2021-02-04 ENCOUNTER — Ambulatory Visit (HOSPITAL_BASED_OUTPATIENT_CLINIC_OR_DEPARTMENT_OTHER): Payer: Medicare PPO | Admitting: Physical Therapy

## 2021-02-09 ENCOUNTER — Other Ambulatory Visit: Payer: Self-pay

## 2021-02-09 ENCOUNTER — Ambulatory Visit (HOSPITAL_BASED_OUTPATIENT_CLINIC_OR_DEPARTMENT_OTHER): Payer: Medicare PPO | Admitting: Physical Therapy

## 2021-02-09 ENCOUNTER — Encounter: Payer: Self-pay | Admitting: Gastroenterology

## 2021-02-09 ENCOUNTER — Encounter (HOSPITAL_BASED_OUTPATIENT_CLINIC_OR_DEPARTMENT_OTHER): Payer: Self-pay | Admitting: Physical Therapy

## 2021-02-09 DIAGNOSIS — R278 Other lack of coordination: Secondary | ICD-10-CM | POA: Diagnosis not present

## 2021-02-09 DIAGNOSIS — M6281 Muscle weakness (generalized): Secondary | ICD-10-CM

## 2021-02-09 DIAGNOSIS — R296 Repeated falls: Secondary | ICD-10-CM | POA: Diagnosis not present

## 2021-02-09 NOTE — Therapy (Signed)
OUTPATIENT PHYSICAL THERAPY TREATMENT NOTE   Patient Name: Kendra Ball MRN: [id number] DOB:Nov 07, 1945, 75 y.o., female Today's Date: 02/09/2021  PCP: Ma Hillock, DO REFERRING PROVIDER: Ma Hillock, DO   PT End of Session - 02/09/21 [phone]     Visit Number 3    Number of Visits 12    Authorization Type [employer]    Progress Note Due on Visit --   02/27/21   PT Start Time 0856    PT Stop Time 0938    PT Time Calculation (min) 42 min    Equipment Utilized During Treatment Gait belt    Activity Tolerance Patient tolerated treatment well    Behavior During Therapy [facility] for tasks assessed/performed              Past Medical History:  Diagnosis Date   Abnormal mammogram 10/01/2013   Diagnostic MMG & [place] confirm cyst & duct ectasia of L breast. 08/2013.    Abnormal stress test 05/27/2020   Abnormal weight gain 05/05/2020   Adenopathy    right supraclavicular   Anxiety    Aortic atherosclerosis ([place]) 05/05/2020   Arthritis    At high risk for falls 01/29/2019   Carotid bruit 05/08/2020   Chronic diarrhea 10/13/2015   COPD (chronic obstructive pulmonary disease) (HCC)    COPD (chronic obstructive pulmonary disease) with chronic bronchitis ([place]) 02/13/2015   Coronary atherosclerosis 10/22/2015   DEPRESSION 08/26/2008   Diffuse large B cell lymphoma ([place]) 11/23/2017   Dyslipidemia 10/03/2013   31% reduction LDL after 6 mos of Simvastatin 10 mg qd. RF: smoker, fam Hx, sedentary HDL is high!!    Dyspnea on exertion 05/08/2020   Essential hypertension 05/22/2014   Family history of malignant neoplasm of gastrointestinal tract 05/05/2020   Family history of malignant neoplasm of gastrointestinal tract 05/05/2020   Family history of premature coronary heart disease 08/13/2013   Father, brother, sister Personal risk factors: smoker, female over 80, fam Hx, sedentary, thyroid disease, dyslipidemia, BMI over 25.    GAD (generalized anxiety disorder) 01/30/2014   Gait abnormality  08/06/2019   H/O malignant lymphoma 05/23/2019   Headache    History of colon polyps 10/13/2015   History of gastric bypass 09/10/2014   Hx of colonic polyps    First noted on  colonoscopy 2012   Hypercholesteremia    HYPERTENSION 08/26/2008   HYPOTHYROIDISM 08/26/2008   Hypothyroidism 08/26/2008   Current meds: levothyroxine 100 mcg qd.     Insomnia 05/05/2015   Iron deficiency anemia 05/05/2020   Lymphoma ([place]) 2019   B-cell   MDD (major depressive disorder), recurrent episode, moderate ([place]) 01/30/2014   [facility] 05/2015 08/18/2015:  Effexor XR 225mg  po qD for depression and anxiety Wellbutrin SR to 200mg  po BID for depression   Xanax 0.5mg  po BID and 1mg  po qHS prn anxiety       Routine PRN Medications:  Yes    Consultations: encouraged to f/up with PCP as needed, encouraged to start individual therapy   Safety Concerns:  Pt denies SI and is at an acute low risk for suicide.Patient told to call clinic i   Multiple fractures 01/29/2019   Obesity    Osteoporosis    Paresthesia 08/06/2019   Port-A-Cath in place 12/27/2017   Pulmonary nodule seen on imaging study 02/13/2015   Smoker 09/14/2015   Substance abuse [facility])    inpatient and outpatient tx for substance abuse   TOBACCO USER 12/25/2009   Tremor 05/23/2019  Past Surgical History:  Procedure Laterality Date   AXILLARY LYMPH NODE BIOPSY Right 06/22/2017   Procedure: RIGHT AXILLARY LYMPH NODE BIOPSY;  Surgeon: [name], MD;  [facility];  Service: General;  Laterality: Right;   BARIATRIC SURGERY  2006   CESAREAN SECTION     COLON RESECTION N/A 05/09/2014   Procedure: LAPAROSCOPIC HAND-ASSISTED EXTENDED RIGHT COLECTOMY, LAPAROSCOPIC LYSIS OF ADHESIONS, SPLENIC FLEXURE MOBILIZATION.;  Surgeon: [name], MD;  Location[facility];  Service: General;  Laterality: N/A;   DILATION AND CURETTAGE OF UTERUS     x2   FOREARM FRACTURE SURGERY     right   GASTRIC BYPASS  2006   IR IMAGING GUIDED PORT INSERTION  12/01/2017   IR REMOVAL TUN ACCESS W/  PORT W/O FL MOD SED  07/03/2018   MASS EXCISION Right 02/24/2017   Procedure: EXCISIONAL BIOPSY RIGHT SUPRA-CLAVICULAR NODE;  Surgeon: [name], MD;  Location: [place];  Service: ENT;  Laterality: Right;   TONSILLECTOMY     Patient Active Problem List   Diagnosis Date Noted   Morbid obesity ([place]) 01/21/2021   Peripheral vascular disease, unspecified ([place]) bilateral carotic arterial disease 07/01/2020   Abnormal stress test 05/27/2020   Substance abuse ([place])    Hx of colonic polyps    Arthritis    Carotid bruit 05/08/2020   Iron deficiency anemia 05/05/2020   Aortic atherosclerosis ([place]) 05/05/2020   Gait abnormality 08/06/2019   Tremor 05/23/2019   H/O malignant lymphoma 05/23/2019   At high risk for falls 01/29/2019   Multiple fractures 01/29/2019   Port-A-Cath in place 12/27/2017   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   Coronary atherosclerosis 10/22/2015   History of colon polyps 10/13/2015   Chronic diarrhea 10/13/2015   Insomnia 05/05/2015   COPD (chronic obstructive pulmonary disease) with chronic bronchitis ([place]) 02/13/2015   Pulmonary nodule seen on imaging study 02/13/2015   Osteoporosis 11/11/2014   History of gastric bypass 09/10/2014   Essential hypertension 05/22/2014   GAD (generalized anxiety disorder) 01/30/2014   MDD (major depressive disorder), recurrent episode, moderate ([place]) 01/30/2014   Dyslipidemia 10/03/2013   Abnormal mammogram 10/01/2013   Family history of premature coronary heart disease 08/13/2013   TOBACCO USER 12/25/2009   Hypothyroidism 08/26/2008    REFERRING PROVIDER: Ma Hillock, DO   REFERRING DIAG: R29.898 (ICD-10-CM) - Weakness of both lower extremities  R26.81 (ICD-10-CM) - Gait instability  R29.6 (ICD-10-CM) - Frequent falls    THERAPY DIAG:  Muscle weakness Lack of coordination   ONSET DATE: March 2022   SUBJECTIVE:    SUBJECTIVE STATEMENT: -Pt reports LE weakness and falling has been  worsening over the past 6 months.  She denies any specific reason. Pt states she has fallen when she changes positions including standing up from a chair or getting up from the floor.  Pt has weakness ascending steps and requires a rail.  Pt has difficulty with performing stairs and performs a step to gait pattern.  Pt denies any limitations with performing household chores. Pt walks 1 mile 3 times per week with her daughter.  She feels fine walking though feels unbalanced when getting in/out of car after stopping walking.  She states it doesn't last long.  Pt states she feels wobbly when she straightens up after bending over to pick up an object.   -Pt reports she has back and posterior LE soreness after prior Rx.  Pt states it took a couple of days for soreness  to improve.  Pt hasn't done the exercises as much as she should.  Pt states as she did her HEP more she had less discomfort.  Pt denies having any falls recently.     PERTINENT HISTORY: B-cell lymphoma and chemo approx 3 years ago.  Pt is in remission. osteoporosis, COPD, coronary artery disease, PVD, iron deficiency potentially could be playing a role in her weakness. morbid obesity. Gastric bypass surgery in 2006   PAIN:  Are you having pain? No     PRECAUTIONS: Other: osteoporosis     FALLS:  Has patient fallen in last 6 months? Yes, Number of falls: 5     PATIENT GOALS improve balance, to not have any falls, reduce progression of weakness     OBJECTIVE:     Therapeutic Exercise:    -Reviewed response to prior Rx, pain level, and current function.  -Pt performed:   -supine SLR 2x10 reps   -Supine bridge 2x10 reps     -supine clams with RTB 2x10   -S/L hip abduction 2x10 reps   -Seated LAQ x 10-12 reps for 1 set AROM and 2x10 with RTB  -Reviewed and performed HEP.   Neuro Re-education:   -Standing with FT without UE assist 3x30 sec   -Standing with FA with EC with occasional UE assist with CGA/min Assist 3x15  sec   -standing in staggered stance 2x20 sec each with CGA/min A and occasional UE assist   -standing on Airex pad with FA x 30 sec and with FT 2x30 sec with SBA/CGA   -marching on airex pad with UE assistance 2x10       PATIENT EDUCATION:  Education details:  Answered Pt's questions.  Reviewed and performed HEP.  Exercise form. Person educated: Patient Education method: Explanation, demonstration, verbal, visual, and tactile cues,  Education comprehension: verbalized understanding, returned demonstration, verbal, visual, and tactile cues required     HOME EXERCISE PROGRAM: Access Code: [email or web address] URL: [email or web address] Date: 02/02/2021 Prepared by: Ronny Flurry  Exercises Supine Active Straight Leg Raise - 1 x daily - 5 x weekly - 2 sets - 10 reps Supine Bridge - 1 x daily - 5 x weekly - 2 sets - 10 reps Hooklying Clamshell with Resistance - 1 x daily - 3-4 x weekly - 2 sets - 10 reps Romberg Stance - 1 x daily - 6-7 x weekly - 1 sets - 2-3 reps - 30 seconds hold     ASSESSMENT:   CLINICAL IMPRESSION: Pt required cuing and instruction for correct form with exercises.  Pt had difficulty performing S/L hip abduction.  PT reviewed HEP today.  Pt did well with balance exercises and was challenged with balance exercises.  She responded well to Rx having no pain after Rx and was appropriately fatigued.  Patient should benefit from continued skilled PT to address above impairments and improve overall function.  Objective impairments include decreased activity tolerance, decreased balance, decreased mobility, and decreased strength. These impairments are limiting patient from  performing safe transfers, stairs, and community activity . Personal factors including Age and 3+ comorbidities: Hx of B-cell lymphoma and in remission, osteoporosis, COPD, coronary artery disease, and PVD are also affecting patient's functional outcome.       GOALS:     SHORT TERM GOALS:    STG Name Target Date Goal status  1 Pt will be independent and compliant with HEP for improved strength, balance, and function.  Baseline:  02/17/2021 INITIAL  2 Pt will be able  to perform standing with FT on airex pad x 30 sec without UE assistance without LOB for improved balance and stability  Baseline:  02/17/2021 INITIAL  3 Pt will report at least a 30% improvement in sx's and stability with daily mobility.  Baseline: 02/17/2021 INITIAL  4 Pt will improve ABC scale by at least 20% for improved self perceived disability with balance confidence.  Baseline: 02/24/2021 INITIAL  LONG TERM GOALS:    LTG Name Target Date Goal status  1 Pt will be able to maintain tandem stance for 20 sec without UE assistance for improved balance and stability.  Baseline: 03/10/2021 INITIAL  2 Pt will deny having falls and report good stability with transfers.  Baseline: 03/10/2021 INITIAL  3 Pt will demo improved hip and knee strength to at least 4+ to 5/5 bilat except 5/5 in hip flexion and 5/5 in L knee extension for improved tolerance to activity and functional mobility.  Baseline: 03/10/2021 INITIAL  4 Pt will be able to perform a reciprocal gait on stairs with the usage of the rail.   Baseline: 03/10/2021 INITIAL                               PLAN:    PLANNED INTERVENTIONS: Therapeutic exercises, Therapeutic activity, Neuro Muscular re-education, Balance training, Gait training, Patient/Family education, Stair training, and Aquatic Therapy   PLAN FOR NEXT SESSION: Review and perform HEP.  Cont with therapeutic exercise and neuro re-education activities.  Attempt standing heel raises.  Perform Berg balance assessment.    Selinda Michaels III PT, DPT 02/09/21 8:28 PM

## 2021-02-11 ENCOUNTER — Ambulatory Visit (HOSPITAL_BASED_OUTPATIENT_CLINIC_OR_DEPARTMENT_OTHER): Payer: Medicare PPO | Admitting: Physical Therapy

## 2021-02-11 ENCOUNTER — Encounter (HOSPITAL_BASED_OUTPATIENT_CLINIC_OR_DEPARTMENT_OTHER): Payer: Self-pay | Admitting: Physical Therapy

## 2021-02-11 ENCOUNTER — Other Ambulatory Visit: Payer: Self-pay

## 2021-02-11 DIAGNOSIS — M6281 Muscle weakness (generalized): Secondary | ICD-10-CM | POA: Diagnosis not present

## 2021-02-11 DIAGNOSIS — R278 Other lack of coordination: Secondary | ICD-10-CM

## 2021-02-11 DIAGNOSIS — R296 Repeated falls: Secondary | ICD-10-CM | POA: Diagnosis not present

## 2021-02-11 NOTE — Therapy (Signed)
OUTPATIENT PHYSICAL THERAPY TREATMENT NOTE   Patient Name: Kendra Ball MRN: [id number] DOB:03-25-1946, 75 y.o., female Today's Date: 02/11/2021  PCP: Ma Hillock, DO REFERRING PROVIDER: Ma Hillock, DO   PT End of Session - 02/11/21 1117     Visit Number 4    Number of Visits 12    Date for PT Re-Evaluation 03/10/21    Authorization Type [employer]    Progress Note Due on Visit --   02/27/21   PT Start Time 1033    PT Stop Time 1117    PT Time Calculation (min) 44 min    Equipment Utilized During Treatment Gait belt    Activity Tolerance Patient tolerated treatment well    Behavior During Therapy [facility] for tasks assessed/performed               Past Medical History:  Diagnosis Date   Abnormal mammogram 10/01/2013   Diagnostic MMG & [place] confirm cyst & duct ectasia of L breast. 08/2013.    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 RF: smoker, fam Hx, sedentary HDL is high!!    Dyspnea on exertion 05/08/2020   Essential hypertension 05/22/2014   Family history of malignant neoplasm of gastrointestinal tract 05/05/2020   Family history of malignant neoplasm of gastrointestinal tract 05/05/2020   Family history of premature coronary heart disease 08/13/2013   Father, brother, sister Personal risk factors: smoker, female over 5, fam Hx, sedentary, thyroid disease, dyslipidemia, BMI over 25.    GAD (generalized anxiety  disorder) 01/30/2014   Gait abnormality 08/06/2019   H/O malignant lymphoma 05/23/2019   Headache    History of colon polyps 10/13/2015   History of gastric bypass 09/10/2014   Hx of colonic polyps    First noted on  colonoscopy 2012   Hypercholesteremia    HYPERTENSION 08/26/2008   HYPOTHYROIDISM 08/26/2008   Hypothyroidism 08/26/2008   Current meds: levothyroxine 100 mcg qd.     Insomnia 05/05/2015   Iron deficiency anemia 05/05/2020   Lymphoma ([place]) 2019   B-cell   MDD (major depressive disorder), recurrent episode, moderate ([place]) 01/30/2014   [facility] 05/2015 08/18/2015:  Effexor XR 225mg  po qD for depression and anxiety Wellbutrin SR to 200mg  po BID for depression   Xanax 0.5mg  po BID and 1mg  po qHS prn anxiety       Routine PRN Medications:  Yes    Consultations: encouraged to f/up with PCP as needed, encouraged to start individual therapy   Safety Concerns:  Pt denies SI and is at an acute low risk for suicide.Patient told to call clinic i   Multiple fractures 01/29/2019   Obesity    Osteoporosis    Paresthesia 08/06/2019   Port-A-Cath in place 12/27/2017   Pulmonary nodule seen on imaging study 02/13/2015   Smoker 09/14/2015   Substance abuse [facility])    inpatient and outpatient tx for substance abuse  TOBACCO USER 12/25/2009   Tremor 05/23/2019   Past Surgical History:  Procedure Laterality Date   AXILLARY LYMPH NODE BIOPSY Right 06/22/2017   Procedure: RIGHT AXILLARY LYMPH NODE BIOPSY;  Surgeon: [name], MD;  [facility]: [facility];  Service: General;  Laterality: Right;   BARIATRIC SURGERY  2006   CESAREAN SECTION     COLON RESECTION N/A 05/09/2014   Procedure: LAPAROSCOPIC HAND-ASSISTED EXTENDED RIGHT COLECTOMY, LAPAROSCOPIC LYSIS OF ADHESIONS, SPLENIC FLEXURE MOBILIZATION.;  Surgeon: [name], MD;  [facility];  Service: General;  Laterality: N/A;   DILATION AND CURETTAGE OF UTERUS     x2   FOREARM FRACTURE SURGERY     right   GASTRIC BYPASS  2006   IR IMAGING GUIDED PORT INSERTION   12/01/2017   IR REMOVAL TUN ACCESS W/ PORT W/O FL MOD SED  07/03/2018   MASS EXCISION Right 02/24/2017   Procedure: EXCISIONAL BIOPSY RIGHT SUPRA-CLAVICULAR NODE;  Surgeon: [name], MD;  Location: [place];  Service: ENT;  Laterality: Right;   TONSILLECTOMY     Patient Active Problem List   Diagnosis Date Noted   Morbid obesity ([place]) 01/21/2021   Peripheral vascular disease, unspecified ([place]) bilateral carotic arterial disease 07/01/2020   Abnormal stress test 05/27/2020   Substance abuse ([place])    Hx of colonic polyps    Arthritis    Carotid bruit 05/08/2020   Iron deficiency anemia 05/05/2020   Aortic atherosclerosis ([place]) 05/05/2020   Gait abnormality 08/06/2019   Tremor 05/23/2019   H/O malignant lymphoma 05/23/2019   At high risk for falls 01/29/2019   Multiple fractures 01/29/2019   Port-A-Cath in place 12/27/2017   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   Coronary atherosclerosis 10/22/2015   History of colon polyps 10/13/2015   Chronic diarrhea 10/13/2015   Insomnia 05/05/2015   COPD (chronic obstructive pulmonary disease) with chronic bronchitis ([place]) 02/13/2015   Pulmonary nodule seen on imaging study 02/13/2015   Osteoporosis 11/11/2014   History of gastric bypass 09/10/2014   Essential hypertension 05/22/2014   GAD (generalized anxiety disorder) 01/30/2014   MDD (major depressive disorder), recurrent episode, moderate ([place]) 01/30/2014   Dyslipidemia 10/03/2013   Abnormal mammogram 10/01/2013   Family history of premature coronary heart disease 08/13/2013   TOBACCO USER 12/25/2009   Hypothyroidism 08/26/2008    REFERRING PROVIDER: Ma Hillock, DO   REFERRING DIAG: R29.898 (ICD-10-CM) - Weakness of both lower extremities  R26.81 (ICD-10-CM) - Gait instability  R29.6 (ICD-10-CM) - Frequent falls    THERAPY DIAG:  Muscle weakness Lack of coordination   ONSET DATE: March 2022   SUBJECTIVE:    SUBJECTIVE STATEMENT: -Pt  reports LE weakness and falling has been worsening over the past 6 months.  She denies any specific reason. Pt states she has fallen when she changes positions including standing up from a chair or getting up from the floor.  Pt has weakness ascending steps and requires a rail.  Pt has difficulty with performing stairs and performs a step to gait pattern.  Pt walks 1 mile 3 times per week with her daughter.  She feels fine walking though feels unbalanced when getting in/out of car after stopping walking.  She states it doesn't last long.  Pt states she feels wobbly when she straightens up after bending over to pick up an object.   -Pt denies having any falls recently.   -Pt denies any adverse effects after prior Rx.  Pt states she didn't have  soreness or aching after prior Rx and used tylenol arthritis when returning home.  Pt did her walking program yesterday and denies any unsteadiness after walking.  Pt is performing her home exercises.  Pt states she has difficulty with S/L hip abduction.       PERTINENT HISTORY: B-cell lymphoma and chemo approx 3 years ago.  Pt is in remission. osteoporosis, COPD, coronary artery disease, PVD, iron deficiency potentially could be playing a role in her weakness. morbid obesity. Gastric bypass surgery in 2006   PAIN:  Are you having pain? No     PRECAUTIONS: Other: osteoporosis     FALLS:  Has patient fallen in last 6 months? Yes, Number of falls: 5     PATIENT GOALS improve balance, to not have any falls, reduce progression of weakness     OBJECTIVE:     Therapeutic Exercise:    -Reviewed response to prior Rx, pain level, and current function.  -Pt performed:   -supine SLR 2x10 reps   -Supine bridge 2x10 reps     -supine clams with RTB 2x10   -S/L hip abduction 2x10 reps   -Seated LAQ x 10-12 reps for 1 set AROM and 2x10 with RTB   -standing hip abd 2x10 with UE assist    - step ups x10 reps each leg with 1 UE assist and CGA/min  asssit  -Reviewed and performed HEP.   Neuro Re-education:   -Standing with FA with EC with occasional UE assist with CGA/min Assist 3x15 sec   -standing in staggered stance 2x20 sec each with CGA/min A and occasional UE assist   -standing on Airex pad with FA 2 x 30 sec and with FT 2x30 sec with SBA/CGA with occasional min assist   -marching on airex pad with UE assistance 2x10       PATIENT EDUCATION:  Education details:  Reviewed and performed HEP.  Exercise form.  Instructed pt to cont with HEP Person educated: Patient Education method: Explanation, demonstration, verbal, visual, and tactile cues,  Education comprehension: verbalized understanding, returned demonstration, verbal, visual, and tactile cues required     HOME EXERCISE PROGRAM: Access Code: [email or web address] URL: [email or web address] Date: 02/02/2021 Prepared by: Ronny Flurry  Exercises Supine Active Straight Leg Raise - 1 x daily - 5 x weekly - 2 sets - 10 reps Supine Bridge - 1 x daily - 5 x weekly - 2 sets - 10 reps Hooklying Clamshell with Resistance - 1 x daily - 3-4 x weekly - 2 sets - 10 reps Romberg Stance - 1 x daily - 6-7 x weekly - 1 sets - 2-3 reps - 30 seconds hold     ASSESSMENT:   CLINICAL IMPRESSION: Pt performed her walking program and didn't have unsteadiness after walking yesterday.  Pt performed exercises well with cuing and instruction for correct form with exercises.  Pt had improved S/L hip abduction though still requires cuing and instruction having difficulty with exercise.  Pt was challenged with balance exercises and required assistance.  Pt had unsteadiness with 4 inch step ups and required CGA and min assist for balance.   She responded well to Rx having no pain after Rx though was tired.  Patient should benefit from continued skilled PT to address above impairments and improve overall function.   Objective impairments include decreased activity tolerance, decreased balance,  decreased mobility, and decreased strength. These impairments are limiting patient from  performing safe transfers, stairs, and community activity . Personal factors including Age and  3+ comorbidities: Hx of B-cell lymphoma and in remission, osteoporosis, COPD, coronary artery disease, and PVD are also affecting patient's functional outcome.       GOALS:     SHORT TERM GOALS:   STG Name Target Date Goal status  1 Pt will be independent and compliant with HEP for improved strength, balance, and function.  Baseline:  02/17/2021 INITIAL  2 Pt will be able to perform standing with FT on airex pad x 30 sec without UE assistance without LOB for improved balance and stability  Baseline:  02/17/2021 INITIAL  3 Pt will report at least a 30% improvement in sx's and stability with daily mobility.  Baseline: 02/17/2021 INITIAL  4 Pt will improve ABC scale by at least 20% for improved self perceived disability with balance confidence.  Baseline: 02/24/2021 INITIAL  LONG TERM GOALS:    LTG Name Target Date Goal status  1 Pt will be able to maintain tandem stance for 20 sec without UE assistance for improved balance and stability.  Baseline: 03/10/2021 INITIAL  2 Pt will deny having falls and report good stability with transfers.  Baseline: 03/10/2021 INITIAL  3 Pt will demo improved hip and knee strength to at least 4+ to 5/5 bilat except 5/5 in hip flexion and 5/5 in L knee extension for improved tolerance to activity and functional mobility.  Baseline: 03/10/2021 INITIAL  4 Pt will be able to perform a reciprocal gait on stairs with the usage of the rail.   Baseline: 03/10/2021 INITIAL                               PLAN:    PLANNED INTERVENTIONS: Therapeutic exercises, Therapeutic activity, Neuro Muscular re-education, Balance training, Gait training, Patient/Family education, Stair training, and Aquatic Therapy   PLAN FOR NEXT SESSION: Review and perform HEP.  Cont with therapeutic exercise  and neuro re-education activities.  Attempt standing heel raises.  Perform Berg balance assessment.    Selinda Michaels III PT, DPT 02/11/21 11:27 AM

## 2021-02-16 ENCOUNTER — Other Ambulatory Visit: Payer: Self-pay

## 2021-02-16 ENCOUNTER — Encounter (HOSPITAL_BASED_OUTPATIENT_CLINIC_OR_DEPARTMENT_OTHER): Payer: Self-pay | Admitting: Physical Therapy

## 2021-02-16 ENCOUNTER — Encounter (HOSPITAL_BASED_OUTPATIENT_CLINIC_OR_DEPARTMENT_OTHER): Payer: Medicare PPO | Attending: Family Medicine | Admitting: Physical Therapy

## 2021-02-16 DIAGNOSIS — R278 Other lack of coordination: Secondary | ICD-10-CM | POA: Insufficient documentation

## 2021-02-16 DIAGNOSIS — M6281 Muscle weakness (generalized): Secondary | ICD-10-CM | POA: Insufficient documentation

## 2021-02-16 NOTE — Therapy (Signed)
OUTPATIENT PHYSICAL THERAPY TREATMENT NOTE   Patient Name: Kendra Ball MRN: [id number] DOB:04-23-1946, 75 y.o., female Today's Date: 02/16/2021  PCP: Ma Hillock, DO REFERRING PROVIDER: Ma Hillock, DO   PT End of Session - 02/16/21 0910     Visit Number 5    Number of Visits 12    Date for PT Re-Evaluation 03/10/21    Authorization Type [employer]    Progress Note Due on Visit --   02/27/21   PT Start Time 0856    PT Stop Time 0939    PT Time Calculation (min) 43 min    Equipment Utilized During Treatment Gait belt    Activity Tolerance Patient tolerated treatment well    Behavior During Therapy [facility] for tasks assessed/performed                Past Medical History:  Diagnosis Date   Abnormal mammogram 10/01/2013   Diagnostic MMG & [place] confirm cyst & duct ectasia of L breast. 08/2013.    Abnormal stress test 05/27/2020   Abnormal weight gain 05/05/2020   Adenopathy    right supraclavicular   Anxiety    Aortic atherosclerosis ([place]) 05/05/2020   Arthritis    At high risk for falls 01/29/2019   Carotid bruit 05/08/2020   Chronic diarrhea 10/13/2015   COPD (chronic obstructive pulmonary disease) (HCC)    COPD (chronic obstructive pulmonary disease) with chronic bronchitis ([place]) 02/13/2015   Coronary atherosclerosis 10/22/2015   DEPRESSION 08/26/2008   Diffuse large B cell lymphoma ([place]) 11/23/2017   Dyslipidemia 10/03/2013   31% reduction LDL after 6 mos of Simvastatin 10 mg qd. RF: smoker, fam Hx, sedentary HDL is high!!    Dyspnea on exertion 05/08/2020   Essential hypertension 05/22/2014   Family history of malignant neoplasm of gastrointestinal tract 05/05/2020   Family history of malignant neoplasm of gastrointestinal tract 05/05/2020   Family history of premature coronary heart disease 08/13/2013   Father, brother, sister Personal risk factors: smoker, female over 60, fam Hx, sedentary, thyroid disease, dyslipidemia, BMI over 25.    GAD (generalized anxiety  disorder) 01/30/2014   Gait abnormality 08/06/2019   H/O malignant lymphoma 05/23/2019   Headache    History of colon polyps 10/13/2015   History of gastric bypass 09/10/2014   Hx of colonic polyps    First noted on  colonoscopy 2012   Hypercholesteremia    HYPERTENSION 08/26/2008   HYPOTHYROIDISM 08/26/2008   Hypothyroidism 08/26/2008   Current meds: levothyroxine 100 mcg qd.     Insomnia 05/05/2015   Iron deficiency anemia 05/05/2020   Lymphoma ([place]) 2019   B-cell   MDD (major depressive disorder), recurrent episode, moderate ([place]) 01/30/2014   [facility] 05/2015 08/18/2015:  Effexor XR 225mg  po qD for depression and anxiety Wellbutrin SR to 200mg  po BID for depression   Xanax 0.5mg  po BID and 1mg  po qHS prn anxiety       Routine PRN Medications:  Yes    Consultations: encouraged to f/up with PCP as needed, encouraged to start individual therapy   Safety Concerns:  Pt denies SI and is at an acute low risk for suicide.Patient told to call clinic i   Multiple fractures 01/29/2019   Obesity    Osteoporosis    Paresthesia 08/06/2019   Port-A-Cath in place 12/27/2017   Pulmonary nodule seen on imaging study 02/13/2015   Smoker 09/14/2015   Substance abuse [facility])    inpatient and outpatient tx for substance abuse  TOBACCO USER 12/25/2009   Tremor 05/23/2019   Past Surgical History:  Procedure Laterality Date   AXILLARY LYMPH NODE BIOPSY Right 06/22/2017   Procedure: RIGHT AXILLARY LYMPH NODE BIOPSY;  Surgeon: [name], MD;  [facility];  Service: General;  Laterality: Right;   BARIATRIC SURGERY  2006   CESAREAN SECTION     COLON RESECTION N/A 05/09/2014   Procedure: LAPAROSCOPIC HAND-ASSISTED EXTENDED RIGHT COLECTOMY, LAPAROSCOPIC LYSIS OF ADHESIONS, SPLENIC FLEXURE MOBILIZATION.;  Surgeon: [name], MD;  [facility];  Service: General;  Laterality: N/A;   DILATION AND CURETTAGE OF UTERUS     x2   FOREARM FRACTURE SURGERY     right   GASTRIC BYPASS  2006   IR IMAGING GUIDED PORT INSERTION   12/01/2017   IR REMOVAL TUN ACCESS W/ PORT W/O FL MOD SED  07/03/2018   MASS EXCISION Right 02/24/2017   Procedure: EXCISIONAL BIOPSY RIGHT SUPRA-CLAVICULAR NODE;  Surgeon: [name], MD;  Location: [place];  Service: ENT;  Laterality: Right;   TONSILLECTOMY     Patient Active Problem List   Diagnosis Date Noted   Morbid obesity ([place]) 01/21/2021   Peripheral vascular disease, unspecified (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) 01/30/2014   Dyslipidemia 10/03/2013   Abnormal mammogram 10/01/2013   Family history of premature coronary heart disease 08/13/2013   TOBACCO USER 12/25/2009   Hypothyroidism 08/26/2008    REFERRING PROVIDER: Ma Hillock, DO   REFERRING DIAG: R29.898 (ICD-10-CM) - Weakness of both lower extremities  R26.81 (ICD-10-CM) - Gait instability  R29.6 (ICD-10-CM) - Frequent falls    THERAPY DIAG:  Muscle weakness Lack of coordination   ONSET DATE: March 2022   SUBJECTIVE:    SUBJECTIVE STATEMENT: -Pt has  weakness ascending steps and requires a rail.  Pt has difficulty with performing stairs and performs a step to gait pattern.   -Pt denies having any falls recently.   -Pt states she was very fatigued the following day after prior Rx.  Pt reports her legs were achy and weak for the majority of the following day. She then recovered the day after.  Pt went Christmas shopping and felt fine afterwards.  Pt reports improved unsteadiness after walking with daughter.  Pt didn't feel wobbly after her walk with her daughter.  She was able to go to grocery store and [place] after her walk.     PERTINENT HISTORY: B-cell lymphoma and chemo approx 3 years ago.  Pt is in remission. osteoporosis, COPD, coronary artery disease, PVD, iron deficiency potentially could be playing  a role in her weakness. morbid obesity. Gastric bypass surgery in 2006   PAIN:  Are you having pain? No     PRECAUTIONS: Other: osteoporosis     FALLS:  Has patient fallen in last 6 months? Yes, Number of falls: 5     PATIENT GOALS improve balance, to not have any falls, reduce progression of weakness     OBJECTIVE:     Therapeutic Exercise:    -Reviewed response to prior Rx, pain level, and current function.  -Pt performed:   -supine clams with GTB 2x10   -S/L hip abduction 2x10 reps   -Seated LAQ 2x10 with GTB        Neuro Re-education:   -Standing with FA with EC with occasional UE assist with CGA/min Assist 3x15-20 sec   -standing in staggered stance 2x20 sec each with CGA and occasional UE assist   -standing on Airex pad with FA 2 x 30 sec with SBA and occasional CGA without UE assist and with FT 2x30 sec with SBA/CGA with occasional min assist   -marching on airex pad without UE assistance 2x10 with CGA  Kinetic Activities:  - step ups on a 4 inch step x10 reps each leg with 1 UE assist and CGA primarily, a few occasions of min assist - sidestepping without UE assist with CGA x 3 laps along rail - sit to  stands without UEs from hi/low table 2x10 reps with SBA      PATIENT EDUCATION:  Education details:  Rationale for balance exercises.  Balance systems.  Exercise form.  Instructed pt to cont with HEP Person educated: Patient Education method: Explanation, demonstration, verbal, visual, and tactile cues,  Education comprehension: verbalized understanding, returned demonstration, verbal, visual, and tactile cues required     HOME EXERCISE PROGRAM: Access Code: [email or web address] URL: [email or web address] Date: 02/02/2021 Prepared by: Ronny Flurry  Exercises Supine Active Straight Leg Raise - 1 x daily - 5 x weekly - 2 sets - 10 reps Supine Bridge - 1 x daily - 5 x weekly - 2 sets - 10 reps Hooklying Clamshell with Resistance - 1 x daily - 3-4 x weekly - 2 sets - 10 reps Romberg Stance - 1 x daily - 6-7 x weekly - 1 sets - 2-3 reps - 30 seconds hold     ASSESSMENT:   CLINICAL IMPRESSION: Pt seems to have improved steadiness with ambulation as evidenced by subjective reports.  Pt gives good effort with all exercises and performed exercises well with cuing and instruction for correct form with exercises.  She required cuing for posture and keeping her head up with balance activities and continues to require cuing for form with S/L hip abd.  She improved with 4 inch step ups though still had difficulty and required some assistance.  Pt responded well to Rx having no pain after Rx though was fatigued.  Patient should benefit from continued skilled PT to address above impairments and improve overall function.   Objective impairments include decreased activity tolerance, decreased balance, decreased mobility, and decreased strength. These impairments are limiting patient from  performing safe transfers, stairs, and community activity . Personal factors including Age and 3+ comorbidities: Hx of B-cell lymphoma and in remission, osteoporosis, COPD, coronary artery disease, and PVD are also  affecting patient's functional outcome.       GOALS:     SHORT TERM GOALS:   STG Name Target Date Goal status  1 Pt will be independent and compliant with HEP for  improved strength, balance, and function.  Baseline:  02/17/2021 INITIAL  2 Pt will be able to perform standing with FT on airex pad x 30 sec without UE assistance without LOB for improved balance and stability  Baseline:  02/17/2021 INITIAL  3 Pt will report at least a 30% improvement in sx's and stability with daily mobility.  Baseline: 02/17/2021 INITIAL  4 Pt will improve ABC scale by at least 20% for improved self perceived disability with balance confidence.  Baseline: 02/24/2021 INITIAL  LONG TERM GOALS:    LTG Name Target Date Goal status  1 Pt will be able to maintain tandem stance for 20 sec without UE assistance for improved balance and stability.  Baseline: 03/10/2021 INITIAL  2 Pt will deny having falls and report good stability with transfers.  Baseline: 03/10/2021 INITIAL  3 Pt will demo improved hip and knee strength to at least 4+ to 5/5 bilat except 5/5 in hip flexion and 5/5 in L knee extension for improved tolerance to activity and functional mobility.  Baseline: 03/10/2021 INITIAL  4 Pt will be able to perform a reciprocal gait on stairs with the usage of the rail.   Baseline: 03/10/2021 INITIAL                               PLAN:    PLANNED INTERVENTIONS: Therapeutic exercises, Therapeutic activity, Neuro Muscular re-education, Balance training, Gait training, Patient/Family education, Stair training, and Aquatic Therapy   PLAN FOR NEXT SESSION: Review and perform HEP.  Cont with therapeutic exercise and neuro re-education activities.  Attempt standing heel raises.  Perform Berg balance assessment.    Selinda Michaels III PT, DPT 02/16/21 11:03 PM

## 2021-02-18 ENCOUNTER — Other Ambulatory Visit: Payer: Self-pay

## 2021-02-18 ENCOUNTER — Ambulatory Visit (HOSPITAL_BASED_OUTPATIENT_CLINIC_OR_DEPARTMENT_OTHER): Payer: Medicare PPO | Admitting: Physical Therapy

## 2021-02-18 ENCOUNTER — Encounter (HOSPITAL_BASED_OUTPATIENT_CLINIC_OR_DEPARTMENT_OTHER): Payer: Self-pay | Admitting: Physical Therapy

## 2021-02-18 DIAGNOSIS — M6281 Muscle weakness (generalized): Secondary | ICD-10-CM | POA: Diagnosis not present

## 2021-02-18 DIAGNOSIS — R296 Repeated falls: Secondary | ICD-10-CM | POA: Diagnosis not present

## 2021-02-18 DIAGNOSIS — R278 Other lack of coordination: Secondary | ICD-10-CM

## 2021-02-18 NOTE — Therapy (Signed)
OUTPATIENT PHYSICAL THERAPY TREATMENT NOTE   Patient Name: Kendra Ball MRN: [id number] DOB:10-05-1945, 75 y.o., female Today's Date: 02/18/2021  PCP: Ma Hillock, DO REFERRING PROVIDER: Ma Hillock, DO   PT End of Session - 02/18/21 1037     Visit Number 6    Number of Visits 12    Date for PT Re-Evaluation 03/10/21    Authorization Type [employer]    Progress Note Due on Visit --   02/27/21   PT Start Time 1025    PT Stop Time 1103    PT Time Calculation (min) 38 min    Equipment Utilized During Treatment Gait belt    Activity Tolerance Patient tolerated treatment well    Behavior During Therapy WFL for tasks assessed/performed                 Past Medical History:  Diagnosis Date   Abnormal mammogram 10/01/2013   Diagnostic MMG & [place] confirm cyst & duct ectasia of L breast. 08/2013.    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 RF: smoker, fam Hx, sedentary HDL is high!!    Dyspnea on exertion 05/08/2020   Essential hypertension 05/22/2014   Family history of malignant neoplasm of gastrointestinal tract 05/05/2020   Family history of malignant neoplasm of gastrointestinal tract 05/05/2020   Family history of premature coronary heart disease 08/13/2013   Father, brother, sister Personal risk factors: smoker, female over 82, fam Hx, sedentary, thyroid disease, dyslipidemia, BMI over 25.    GAD (generalized anxiety  disorder) 01/30/2014   Gait abnormality 08/06/2019   H/O malignant lymphoma 05/23/2019   Headache    History of colon polyps 10/13/2015   History of gastric bypass 09/10/2014   Hx of colonic polyps    First noted on  colonoscopy 2012   Hypercholesteremia    HYPERTENSION 08/26/2008   HYPOTHYROIDISM 08/26/2008   Hypothyroidism 08/26/2008   Current meds: levothyroxine 100 mcg qd.     Insomnia 05/05/2015   Iron deficiency anemia 05/05/2020   Lymphoma ([place]) 2019   B-cell   MDD (major depressive disorder), recurrent episode, moderate ([place]) 01/30/2014   [facility] 05/2015 08/18/2015:  Effexor XR 225mg  po qD for depression and anxiety Wellbutrin SR to 200mg  po BID for depression   Xanax 0.5mg  po BID and 1mg  po qHS prn anxiety       Routine PRN Medications:  Yes    Consultations: encouraged to f/up with PCP as needed, encouraged to start individual therapy   Safety Concerns:  Pt denies SI and is at an acute low risk for suicide.Patient told to call clinic i   Multiple fractures 01/29/2019   Obesity    Osteoporosis    Paresthesia 08/06/2019   Port-A-Cath in place 12/27/2017   Pulmonary nodule seen on imaging study 02/13/2015   Smoker 09/14/2015   Substance abuse [facility])    inpatient and outpatient tx for substance abuse  TOBACCO USER 12/25/2009   Tremor 05/23/2019   Past Surgical History:  Procedure Laterality Date   AXILLARY LYMPH NODE BIOPSY Right 06/22/2017   Procedure: RIGHT AXILLARY LYMPH NODE BIOPSY;  Surgeon: [name], MD;  [facility];  Service: General;  Laterality: Right;   BARIATRIC SURGERY  2006   CESAREAN SECTION     COLON RESECTION N/A 05/09/2014   Procedure: LAPAROSCOPIC HAND-ASSISTED EXTENDED RIGHT COLECTOMY, LAPAROSCOPIC LYSIS OF ADHESIONS, SPLENIC FLEXURE MOBILIZATION.;  Surgeon: [name], MD;  [facility];  Service: General;  Laterality: N/A;   DILATION AND CURETTAGE OF UTERUS     x2   FOREARM FRACTURE SURGERY     right   GASTRIC BYPASS  2006   IR IMAGING GUIDED PORT INSERTION   12/01/2017   IR REMOVAL TUN ACCESS W/ PORT W/O FL MOD SED  07/03/2018   MASS EXCISION Right 02/24/2017   Procedure: EXCISIONAL BIOPSY RIGHT SUPRA-CLAVICULAR NODE;  Surgeon: [name], MD;  Location: [place];  Service: ENT;  Laterality: Right;   TONSILLECTOMY     Patient Active Problem List   Diagnosis Date Noted   Morbid obesity ([place]) 01/21/2021   Peripheral vascular disease, unspecified ([place]) bilateral carotic arterial disease 07/01/2020   Abnormal stress test 05/27/2020   Substance abuse ([place])    Hx of colonic polyps    Arthritis    Carotid bruit 05/08/2020   Iron deficiency anemia 05/05/2020   Aortic atherosclerosis ([place]) 05/05/2020   Gait abnormality 08/06/2019   Tremor 05/23/2019   H/O malignant lymphoma 05/23/2019   At high risk for falls 01/29/2019   Multiple fractures 01/29/2019   Port-A-Cath in place 12/27/2017   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   Coronary atherosclerosis 10/22/2015   History of colon polyps 10/13/2015   Chronic diarrhea 10/13/2015   Insomnia 05/05/2015   COPD (chronic obstructive pulmonary disease) with chronic bronchitis ([place]) 02/13/2015   Pulmonary nodule seen on imaging study 02/13/2015   Osteoporosis 11/11/2014   History of gastric bypass 09/10/2014   Essential hypertension 05/22/2014   GAD (generalized anxiety disorder) 01/30/2014   MDD (major depressive disorder), recurrent episode, moderate ([place]) 01/30/2014   Dyslipidemia 10/03/2013   Abnormal mammogram 10/01/2013   Family history of premature coronary heart disease 08/13/2013   TOBACCO USER 12/25/2009   Hypothyroidism 08/26/2008    REFERRING PROVIDER: Ma Hillock, DO   REFERRING DIAG: R29.898 (ICD-10-CM) - Weakness of both lower extremities  R26.81 (ICD-10-CM) - Gait instability  R29.6 (ICD-10-CM) - Frequent falls    THERAPY DIAG:  Muscle weakness Lack of coordination   ONSET DATE: March 2022   SUBJECTIVE:    SUBJECTIVE STATEMENT: -Pt has  weakness ascending steps and requires a rail.  Pt has difficulty with performing stairs and performs a step to gait pattern.   -Pt denies having any falls recently.   -Pt denies any adverse effects after prior Rx and states she felt great the following day after prior Rx.  Pt states she did her household chores and walking yesterday fine though is achy today.  Pt states her balance is improving.   PERTINENT HISTORY: B-cell lymphoma and chemo approx 3 years ago.  Pt is in remission. osteoporosis, COPD, coronary artery disease, PVD, iron deficiency potentially could be playing a role in her weakness. morbid obesity. Gastric bypass surgery in 2006   PAIN:  Are you having pain? No just aching in lumbar.      PRECAUTIONS: Other: osteoporosis     FALLS:  Has patient fallen in last 6 months? Yes, Number of falls: 5     PATIENT GOALS improve balance, to not have any falls, reduce progression of weakness     OBJECTIVE:     Therapeutic Exercise:    -Reviewed response to prior Rx, pain level, and current function.  -Pt performed:   -supine clams with GTB 2x10   -S/L hip abduction 2x10 reps   -Seated LAQ 2x10 with GTB   -supine bridge 2x10 reps        Neuro Re-education:   -Standing with FA with EC without UE assist x 30 sec and with FT 2x20 sec with CGA   -standing on Airex pad with FA 2 x 30 sec with SBA and occasional CGA without UE assist and with FT 2x30 sec with SBA/CGA with occasional min assist   -marching on airex pad without UE assistance 2x10 with CGA   -tandem gait with 1 UE assist  Kinetic Activities:  - step ups on a 4 inch step  2 sets of 10 reps each leg with 1 UE assist and CGA primarily, a few occasions of min assist - sidestepping without UE assist x 6 laps along rail - sit to stands without UEs from hi/low table 2x10 reps with SBA      PATIENT EDUCATION:  Education details:  Rationale for balance exercises.  Balance systems.  Exercise form.  Instructed pt to  cont with HEP Person educated: Patient Education method: Explanation, demonstration, verbal, visual, and tactile cues,  Education comprehension: verbalized understanding, returned demonstration, verbal, visual, and tactile cues required     HOME EXERCISE PROGRAM: Access Code: [email or web address] URL: [email or web address] Date: 02/02/2021 Prepared by: Ronny Flurry  Exercises Supine Active Straight Leg Raise - 1 x daily - 5 x weekly - 2 sets - 10 reps Supine Bridge - 1 x daily - 5 x weekly - 2 sets - 10 reps Hooklying Clamshell with Resistance - 1 x daily - 3-4 x weekly - 2 sets - 10 reps Romberg Stance - 1 x daily - 6-7 x weekly - 1 sets - 2-3 reps - 30 seconds hold     ASSESSMENT:   CLINICAL IMPRESSION: Pt is making good progress as evidenced by subjective reports.  She reports improved balance and improved steadiness after her walking program.  Pt gives good effort with all exercises and performed exercises well with cuing and instruction for correct form with exercises.  Pt progressing with strength and stability as evidenced by performance of exercises.  Pt demonstrated improved performance of balance exercises and step ups.  Pt responded well to Rx having no pain and no c/o's after Rx.  Patient should benefit from continued skilled PT to address above impairments and improve overall function.   Objective impairments include decreased activity tolerance, decreased balance, decreased mobility, and decreased strength. These impairments are limiting patient from  performing safe transfers, stairs, and community activity . Personal factors including Age and 3+ comorbidities: Hx of B-cell lymphoma and in remission, osteoporosis, COPD, coronary artery disease, and PVD are also affecting patient's functional outcome.       GOALS:     SHORT TERM GOALS:   STG Name Target Date Goal status  1 Pt will be independent and compliant with HEP for improved strength, balance, and function.   Baseline:  02/17/2021 INITIAL  2 Pt will be able to perform standing with FT on airex pad x 30 sec without UE assistance without LOB for improved balance and stability  Baseline:  02/17/2021 INITIAL  3 Pt will report at least a 30% improvement in sx's and stability with daily mobility.  Baseline: 02/17/2021 INITIAL  4 Pt will improve ABC scale by at least 20% for improved self perceived disability with balance confidence.  Baseline: 02/24/2021 INITIAL  LONG TERM GOALS:    LTG Name Target Date Goal status  1 Pt will be able to maintain tandem stance for 20 sec without UE assistance for improved balance and stability.  Baseline: 03/10/2021 INITIAL  2 Pt will deny having falls and report good stability with transfers.  Baseline: 03/10/2021 INITIAL  3 Pt will demo improved hip and knee strength to at least 4+ to 5/5 bilat except 5/5 in hip flexion and 5/5 in L knee extension for improved tolerance to activity and functional mobility.  Baseline: 03/10/2021 INITIAL  4 Pt will be able to perform a reciprocal gait on stairs with the usage of the rail.   Baseline: 03/10/2021 INITIAL                               PLAN:    PLANNED INTERVENTIONS: Therapeutic exercises, Therapeutic activity, Neuro Muscular re-education, Balance training, Gait training, Patient/Family education, Stair training, and Aquatic Therapy   PLAN FOR NEXT SESSION: Cont with therapeutic exercise and neuro re-education activities.  Attempt standing heel raises.  Perform Berg balance assessment.    Selinda Michaels III PT, DPT 02/18/21 10:14 PM

## 2021-02-23 ENCOUNTER — Ambulatory Visit (HOSPITAL_BASED_OUTPATIENT_CLINIC_OR_DEPARTMENT_OTHER): Payer: Medicare PPO | Attending: Family Medicine | Admitting: Physical Therapy

## 2021-02-23 ENCOUNTER — Other Ambulatory Visit: Payer: Self-pay

## 2021-02-23 ENCOUNTER — Encounter (HOSPITAL_BASED_OUTPATIENT_CLINIC_OR_DEPARTMENT_OTHER): Payer: Self-pay | Admitting: Physical Therapy

## 2021-02-23 DIAGNOSIS — R278 Other lack of coordination: Secondary | ICD-10-CM | POA: Diagnosis not present

## 2021-02-23 DIAGNOSIS — M6281 Muscle weakness (generalized): Secondary | ICD-10-CM | POA: Diagnosis not present

## 2021-02-23 NOTE — Therapy (Signed)
OUTPATIENT PHYSICAL THERAPY TREATMENT NOTE   Patient Name: Kendra Ball MRN: [id number] DOB:[birth year], 75 y.o., female Today's Date: 02/23/2021  PCP: Ma Hillock, DO REFERRING PROVIDER: Ma Hillock, DO   PT End of Session - 02/23/21 0857     Visit Number 7    Number of Visits 12    Date for PT Re-Evaluation 03/10/21    Authorization Type [employer]    Progress Note Due on Visit --   02/27/21   PT Start Time 0852    PT Stop Time 0935    PT Time Calculation (min) 43 min    Equipment Utilized During Treatment Gait belt    Activity Tolerance Patient tolerated treatment well    Behavior During Therapy [facility] for tasks assessed/performed                 Past Medical History:  Diagnosis Date   Abnormal mammogram 10/01/2013   Diagnostic MMG & [place] confirm cyst & duct ectasia of L breast. 08/2013.    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 RF: smoker, fam Hx, sedentary HDL is high!!    Dyspnea on exertion 05/08/2020   Essential hypertension 05/22/2014   Family history of malignant neoplasm of gastrointestinal tract 05/05/2020   Family history of malignant neoplasm of gastrointestinal tract 05/05/2020   Family history of premature coronary heart disease 08/13/2013   Father, brother, sister Personal risk factors: smoker, female over 67, fam Hx, sedentary, thyroid disease, dyslipidemia, BMI over 25.    GAD (generalized anxiety  disorder) 01/30/2014   Gait abnormality 08/06/2019   H/O malignant lymphoma 05/23/2019   Headache    History of colon polyps 10/13/2015   History of gastric bypass 09/10/2014   Hx of colonic polyps    First noted on  colonoscopy 2012   Hypercholesteremia    HYPERTENSION 08/26/2008   HYPOTHYROIDISM 08/26/2008   Hypothyroidism 08/26/2008   Current meds: levothyroxine 100 mcg qd.     Insomnia 05/05/2015   Iron deficiency anemia 05/05/2020   Lymphoma ([place]) 2019   B-cell   MDD (major depressive disorder), recurrent episode, moderate ([place]) 01/30/2014   [facility] 05/2015 08/18/2015:  Effexor XR 225mg  po qD for depression and anxiety Wellbutrin SR to 200mg  po BID for depression   Xanax 0.5mg  po BID and 1mg  po qHS prn anxiety       Routine PRN Medications:  Yes    Consultations: encouraged to f/up with PCP as needed, encouraged to start individual therapy   Safety Concerns:  Pt denies SI and is at an acute low risk for suicide.Patient told to call clinic i   Multiple fractures 01/29/2019   Obesity    Osteoporosis    Paresthesia 08/06/2019   Port-A-Cath in place 12/27/2017   Pulmonary nodule seen on imaging study 02/13/2015   Smoker 09/14/2015   Substance abuse [facility])    inpatient and outpatient tx for substance abuse  TOBACCO USER 12/25/2009   Tremor 05/23/2019   Past Surgical History:  Procedure Laterality Date   AXILLARY LYMPH NODE BIOPSY Right 06/22/2017   Procedure: RIGHT AXILLARY LYMPH NODE BIOPSY;  Surgeon: [name], MD;  [facility];  Service: General;  Laterality: Right;   BARIATRIC SURGERY  2006   CESAREAN SECTION     COLON RESECTION N/A 05/09/2014   Procedure: LAPAROSCOPIC HAND-ASSISTED EXTENDED RIGHT COLECTOMY, LAPAROSCOPIC LYSIS OF ADHESIONS, SPLENIC FLEXURE MOBILIZATION.;  Surgeon: [name], MD;  [facility];  Service: General;  Laterality: N/A;   DILATION AND CURETTAGE OF UTERUS     x2   FOREARM FRACTURE SURGERY     right   GASTRIC BYPASS  2006   IR IMAGING GUIDED PORT INSERTION   12/01/2017   IR REMOVAL TUN ACCESS W/ PORT W/O FL MOD SED  07/03/2018   MASS EXCISION Right 02/24/2017   Procedure: EXCISIONAL BIOPSY RIGHT SUPRA-CLAVICULAR NODE;  Surgeon: [name], MD;  Location: [place];  Service: ENT;  Laterality: Right;   TONSILLECTOMY     Patient Active Problem List   Diagnosis Date Noted   Morbid obesity ([place]) 01/21/2021   Peripheral vascular disease, unspecified ([place]) bilateral carotic arterial disease 07/01/2020   Abnormal stress test 05/27/2020   Substance abuse ([place])    Hx of colonic polyps    Arthritis    Carotid bruit 05/08/2020   Iron deficiency anemia 05/05/2020   Aortic atherosclerosis ([place]) 05/05/2020   Gait abnormality 08/06/2019   Tremor 05/23/2019   H/O malignant lymphoma 05/23/2019   At high risk for falls 01/29/2019   Multiple fractures 01/29/2019   Port-A-Cath in place 12/27/2017   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   Coronary atherosclerosis 10/22/2015   History of colon polyps 10/13/2015   Chronic diarrhea 10/13/2015   Insomnia 05/05/2015   COPD (chronic obstructive pulmonary disease) with chronic bronchitis ([place]) 02/13/2015   Pulmonary nodule seen on imaging study 02/13/2015   Osteoporosis 11/11/2014   History of gastric bypass 09/10/2014   Essential hypertension 05/22/2014   GAD (generalized anxiety disorder) 01/30/2014   MDD (major depressive disorder), recurrent episode, moderate ([place]) 01/30/2014   Dyslipidemia 10/03/2013   Abnormal mammogram 10/01/2013   Family history of premature coronary heart disease 08/13/2013   TOBACCO USER 12/25/2009   Hypothyroidism 08/26/2008    REFERRING PROVIDER: Ma Hillock, DO   REFERRING DIAG: R29.898 (ICD-10-CM) - Weakness of both lower extremities  R26.81 (ICD-10-CM) - Gait instability  R29.6 (ICD-10-CM) - Frequent falls    THERAPY DIAG:  Muscle weakness Lack of coordination   ONSET DATE: March 2022   SUBJECTIVE:    SUBJECTIVE STATEMENT:   -Pt  denies having any falls recently.  Pt states she is steadier and has more confidence with her stability.  Pt has difficulty with stairs and hasn't been trying stairs much.  Pt performs a step to gait pattern and uses the rail.  Pt states her balance is improving and she is not as "wobbly".  Pt is able to ambulate better on the uneven terrain in her front yard.  -Pt denies any adverse effects after prior Rx.  Pt reports compliance with HEP.    PERTINENT HISTORY: B-cell lymphoma and chemo approx 3 years ago.  Pt is in remission. osteoporosis, COPD, coronary artery disease, PVD, iron deficiency potentially could be playing a role in her weakness. morbid obesity. Gastric bypass surgery in 2006   PAIN:  Are you having pain? No just aching in  lumbar.      PRECAUTIONS: Other: osteoporosis     FALLS:  Has patient fallen in last 6 months? Yes, Number of falls: 5     PATIENT GOALS improve balance, to not have any falls, reduce progression of weakness     OBJECTIVE:     Neuro Re-education:   -Reviewed response to prior Rx, pain level, and current function. -Berg Balance Assessment:  51/56.  Pt unable to perform tandem stance and SLS.   -sidestepping without UE assist x 6 laps along rail   -tandem gait with 1 UE assist  Kinetic Activities:  - step ups on a 6 inch step  2 sets of 10 reps each leg with 1 UE assist and CGA primarily with one occasion of min assist - lateral step ups on 4 inch step x10 reps with UE assist - sit to stands without UEs from hi/low table 2x10 reps with SBA      PATIENT EDUCATION:  Education details:  Educated pt concerning Berg Score and how it relates to balance.  Exercise form.  Instructed pt to cont with HEP Person educated: Patient Education method: Explanation, demonstration, verbal, visual, and tactile cues,  Education comprehension: verbalized understanding, returned demonstration, verbal, visual, and tactile cues required     HOME EXERCISE  PROGRAM: Access Code: [email or web address] URL: [email or web address] Date: 02/02/2021 Prepared by: Ronny Flurry  Exercises Supine Active Straight Leg Raise - 1 x daily - 5 x weekly - 2 sets - 10 reps Supine Bridge - 1 x daily - 5 x weekly - 2 sets - 10 reps Hooklying Clamshell with Resistance - 1 x daily - 3-4 x weekly - 2 sets - 10 reps Romberg Stance - 1 x daily - 6-7 x weekly - 1 sets - 2-3 reps - 30 seconds hold     ASSESSMENT:   CLINICAL IMPRESSION: Pt is making good progress as evidenced by subjective reports, exercise performance, and objective findings.  She reports improved balance and improved steadiness overall including walking on uneven ground in her yard.  She continues to have difficulty with performing stairs.  Pt did well on Berg Balance Assessment scoring 51/56.  She had difficulty being unable to perform tandem stance and SLS.  Pt is improving with performance of step ups and sit to stands.  Pt responded well to Rx having no pain and no c/o's after Rx.  Patient should benefit from continued skilled PT to address above impairments and improve overall function.     Objective impairments include decreased activity tolerance, decreased balance, decreased mobility, and decreased strength. These impairments are limiting patient from  performing safe transfers, stairs, and community activity . Personal factors including Age and 3+ comorbidities: Hx of B-cell lymphoma and in remission, osteoporosis, COPD, coronary artery disease, and PVD are also affecting patient's functional outcome.       GOALS:     SHORT TERM GOALS:   STG Name Target Date Goal status  1 Pt will be independent and compliant with HEP for improved strength, balance, and function.  Baseline:  02/17/2021 INITIAL  2 Pt will be able to perform standing with FT on airex pad x 30 sec without UE assistance without LOB for improved balance and stability  Baseline:  02/17/2021 INITIAL  3 Pt will report at  least a 30% improvement in sx's and stability with daily mobility.  Baseline: 02/17/2021 INITIAL  4 Pt will improve ABC scale by at least 20% for improved self perceived disability with balance confidence.  Baseline: 02/24/2021 INITIAL  LONG TERM GOALS:    LTG Name Target Date Goal status  1 Pt will be able to maintain tandem stance for 20 sec without UE assistance for improved balance and stability.  Baseline: 03/10/2021 INITIAL  2 Pt will deny having falls and report good stability with transfers.  Baseline: 03/10/2021 INITIAL  3 Pt will demo improved hip and knee strength to at least 4+ to 5/5 bilat except 5/5 in hip flexion and 5/5 in L knee extension for improved tolerance to activity and functional mobility.  Baseline: 03/10/2021 INITIAL  4 Pt will be able to perform a reciprocal gait on stairs with the usage of the rail.   Baseline: 03/10/2021 INITIAL                               PLAN:    PLANNED INTERVENTIONS: Therapeutic exercises, Therapeutic activity, Neuro Muscular re-education, Balance training, Gait training, Patient/Family education, Stair training, and Aquatic Therapy   PLAN FOR NEXT SESSION: Cont with therapeutic exercise and neuro re-education activities.  Attempt standing heel raises.    Selinda Michaels III PT, DPT 02/23/21 9:17 PM

## 2021-02-25 ENCOUNTER — Other Ambulatory Visit: Payer: Self-pay

## 2021-02-25 ENCOUNTER — Encounter (HOSPITAL_BASED_OUTPATIENT_CLINIC_OR_DEPARTMENT_OTHER): Payer: Self-pay | Admitting: Physical Therapy

## 2021-02-25 ENCOUNTER — Ambulatory Visit (HOSPITAL_BASED_OUTPATIENT_CLINIC_OR_DEPARTMENT_OTHER): Payer: Medicare PPO | Admitting: Physical Therapy

## 2021-02-25 DIAGNOSIS — M6281 Muscle weakness (generalized): Secondary | ICD-10-CM | POA: Diagnosis not present

## 2021-02-25 DIAGNOSIS — R278 Other lack of coordination: Secondary | ICD-10-CM

## 2021-02-25 NOTE — Therapy (Signed)
OUTPATIENT PHYSICAL THERAPY TREATMENT NOTE   Patient Name: Kendra Ball MRN: [id number] DOB:[birth year], 75 y.o., female Today's Date: 02/25/2021  PCP: Ma Hillock, DO REFERRING PROVIDER: Ma Hillock, DO   PT End of Session - 02/25/21 1104     Visit Number 8    Number of Visits 12    Date for PT Re-Evaluation 03/10/21    Authorization Type [employer]    Progress Note Due on Visit --   02/27/21   PT Start Time 1023    PT Stop Time 1103    PT Time Calculation (min) 40 min    Activity Tolerance Patient tolerated treatment well    Behavior During Therapy [facility] for tasks assessed/performed                  Past Medical History:  Diagnosis Date   Abnormal mammogram 10/01/2013   Diagnostic [facility] & [place] confirm cyst & duct ectasia of L breast. 08/2013.    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 RF: smoker, fam Hx, sedentary HDL is high!!    Dyspnea on exertion 05/08/2020   Essential hypertension 05/22/2014   Family history of malignant neoplasm of gastrointestinal tract 05/05/2020   Family history of malignant neoplasm of gastrointestinal tract 05/05/2020   Family history of premature coronary heart disease 08/13/2013   Father, brother, sister Personal risk factors: smoker, female over 40, fam Hx, sedentary, thyroid disease, dyslipidemia, BMI over 25.    GAD (generalized anxiety disorder) 01/30/2014   Gait abnormality  08/06/2019   H/O malignant lymphoma 05/23/2019   Headache    History of colon polyps 10/13/2015   History of gastric bypass 09/10/2014   Hx of colonic polyps    First noted on  colonoscopy 2012   Hypercholesteremia    HYPERTENSION 08/26/2008   HYPOTHYROIDISM 08/26/2008   Hypothyroidism 08/26/2008   Current meds: levothyroxine 100 mcg qd.     Insomnia 05/05/2015   Iron deficiency anemia 05/05/2020   Lymphoma ([place]) 2019   B-cell   MDD (major depressive disorder), recurrent episode, moderate ([place]) 01/30/2014   [facility] 05/2015 08/18/2015:  Effexor XR 225mg  po qD for depression and anxiety Wellbutrin SR to 200mg  po BID for depression   Xanax 0.5mg  po BID and 1mg  po qHS prn anxiety       Routine PRN Medications:  Yes    Consultations: encouraged to f/up with PCP as needed, encouraged to start individual therapy   Safety Concerns:  Pt denies SI and is at an acute low risk for suicide.Patient told to call clinic i   Multiple fractures 01/29/2019   Obesity    Osteoporosis    Paresthesia 08/06/2019   Port-A-Cath in place 12/27/2017   Pulmonary nodule seen on imaging study 02/13/2015   Smoker 09/14/2015   Substance abuse [facility])    inpatient and outpatient tx for substance abuse   TOBACCO USER 12/25/2009   Tremor  05/23/2019   Past Surgical History:  Procedure Laterality Date   AXILLARY LYMPH NODE BIOPSY Right 06/22/2017   Procedure: RIGHT AXILLARY LYMPH NODE BIOPSY;  Surgeon: [name], MD;  Location: [facility];  Service: General;  Laterality: Right;   BARIATRIC SURGERY  2006   CESAREAN SECTION     COLON RESECTION N/A 05/09/2014   Procedure: LAPAROSCOPIC HAND-ASSISTED EXTENDED RIGHT COLECTOMY, LAPAROSCOPIC LYSIS OF ADHESIONS, SPLENIC FLEXURE MOBILIZATION.;  Surgeon: [name], MD;  Location: [facility];  Service: General;  Laterality: N/A;   DILATION AND CURETTAGE OF UTERUS     x2   FOREARM FRACTURE SURGERY     right   GASTRIC BYPASS  2006   IR IMAGING GUIDED PORT INSERTION  12/01/2017   IR REMOVAL TUN ACCESS W/  PORT W/O FL MOD SED  07/03/2018   MASS EXCISION Right 02/24/2017   Procedure: EXCISIONAL BIOPSY RIGHT SUPRA-CLAVICULAR NODE;  Surgeon: [name], MD;  Location: [facility];  Service: ENT;  Laterality: Right;   TONSILLECTOMY     Patient Active Problem List   Diagnosis Date Noted   Morbid obesity ([place]) 01/21/2021   Peripheral vascular disease, unspecified ([place]) bilateral carotic arterial disease 07/01/2020   Abnormal stress test 05/27/2020   Substance abuse ([place])    Hx of colonic polyps    Arthritis    Carotid bruit 05/08/2020   Iron deficiency anemia 05/05/2020   Aortic atherosclerosis ([place]) 05/05/2020   Gait abnormality 08/06/2019   Tremor 05/23/2019   H/O malignant lymphoma 05/23/2019   At high risk for falls 01/29/2019   Multiple fractures 01/29/2019   Port-A-Cath in place 12/27/2017   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   Coronary atherosclerosis 10/22/2015   History of colon polyps 10/13/2015   Chronic diarrhea 10/13/2015   Insomnia 05/05/2015   COPD (chronic obstructive pulmonary disease) with chronic bronchitis ([place]) 02/13/2015   Pulmonary nodule seen on imaging study 02/13/2015   Osteoporosis 11/11/2014   History of gastric bypass 09/10/2014   Essential hypertension 05/22/2014   GAD (generalized anxiety disorder) 01/30/2014   MDD (major depressive disorder), recurrent episode, moderate ([place]) 01/30/2014   Dyslipidemia 10/03/2013   Abnormal mammogram 10/01/2013   Family history of premature coronary heart disease 08/13/2013   TOBACCO USER 12/25/2009   Hypothyroidism 08/26/2008    REFERRING PROVIDER: Ma Hillock, DO   REFERRING DIAG: R29.898 (ICD-10-CM) - Weakness of both lower extremities  R26.81 (ICD-10-CM) - Gait instability  R29.6 (ICD-10-CM) - Frequent falls    THERAPY DIAG:  Muscle weakness Lack of coordination   ONSET DATE: March 2022   SUBJECTIVE:    SUBJECTIVE STATEMENT:   -Pt denies having any falls recently.  Pt  states she is steadier and has more confidence with her stability.  Pt has difficulty with stairs and hasn't been trying stairs much.  Pt performs a step to gait pattern and uses the rail.  Pt states her balance is improving and she is not as "wobbly".  Pt is able to ambulate better on the uneven terrain in her front yard.  -Pt denies any adverse effects after prior Rx.  Pt reports compliance with HEP.  Pt denies pain currently.  Pt states she was able to go up 2 steps with grocery bags which she could not do before.  She used to set the bags on the porch and then walk up the steps.  Pt performed a step to pattern and did not feel wobbly.   PERTINENT HISTORY: B-cell lymphoma and  chemo approx 3 years ago.  Pt is in remission. osteoporosis, COPD, coronary artery disease, PVD, iron deficiency potentially could be playing a role in her weakness. morbid obesity. Gastric bypass surgery in 2006   PAIN:  Are you having pain? No     PRECAUTIONS: Other: osteoporosis     FALLS:  Has patient fallen in last 6 months? Yes, Number of falls: 5     PATIENT GOALS improve balance, to not have any falls, reduce progression of weakness     OBJECTIVE:    Therapeutic Exercise:  -Reviewed response to prior Rx, HEP compliance, pain level, and current function.   -Pt performed:   Standing heel raises 2x10 reps   standing hip abduction 2x10 reps                       Seated LAQ 2x10 with GTB   Neuro Re-education: -standing on airex with EC 3x20 sec with CGA and occasional min assist -marching on airex x 20 reps with and without UE assist with CGA and occasional min assist   -sidestepping without UE assist x 5 laps along rail   -tandem gait with 1 UE assist  Kinetic Activities:  - step ups on a 6 inch step  2 sets of 10 reps each leg with 1 UE assist and CGA primarily with one occasion of min assist - lateral step ups on 4 inch step 2x10 reps with UE assist - sit to stands without UEs from hi/low table  2x10 reps with SBA      PATIENT EDUCATION:  Education details:  POC.  Exercise form.  Instructed pt to cont with HEP Person educated: Patient Education method: Explanation, demonstration, verbal, visual, and tactile cues,  Education comprehension: verbalized understanding, returned demonstration, verbal, visual, and tactile cues required     HOME EXERCISE PROGRAM: Access Code: [email or web address] URL: [email or web address] Date: 02/02/2021 Prepared by: Ronny Flurry  Exercises Supine Active Straight Leg Raise - 1 x daily - 5 x weekly - 2 sets - 10 reps Supine Bridge - 1 x daily - 5 x weekly - 2 sets - 10 reps Hooklying Clamshell with Resistance - 1 x daily - 3-4 x weekly - 2 sets - 10 reps Romberg Stance - 1 x daily - 6-7 x weekly - 1 sets - 2-3 reps - 30 seconds hold     ASSESSMENT:   CLINICAL IMPRESSION: Pt is making good progress with mobility and balance as evidenced by subjective reports.  She reports improved performance of steps to enter home including carrying grocery bags up  2 steps.  Pt reports improved stability with daily mobility also.  Pt performed exercises well with cuing for correct form.  She has improved with performing sit to stands and step ups.  Pt requires UE assist with tandem gait.  Pt responded well to Rx having no pain after Rx.  She was fatigued with exercises today and had labored breathing requiring rest breaks t/o Rx.  Patient should benefit from continued skilled PT to address above impairments and improve overall function.    Objective impairments include decreased activity tolerance, decreased balance, decreased mobility, and decreased strength. These impairments are limiting patient from  performing safe transfers, stairs, and community activity . Personal factors including Age and 3+ comorbidities: Hx of B-cell lymphoma and in remission, osteoporosis, COPD, coronary artery disease, and PVD are also affecting patient's functional outcome.        GOALS:     SHORT  TERM GOALS:   STG Name Target Date Goal status  1 Pt will be independent and compliant with HEP for improved strength, balance, and function.  Baseline:  02/17/2021 INITIAL  2 Pt will be able to perform standing with FT on airex pad x 30 sec without UE assistance without LOB for improved balance and stability  Baseline:  02/17/2021 INITIAL  3 Pt will report at least a 30% improvement in sx's and stability with daily mobility.  Baseline: 02/17/2021 INITIAL  4 Pt will improve ABC scale by at least 20% for improved self perceived disability with balance confidence.  Baseline: 02/24/2021 INITIAL  LONG TERM GOALS:    LTG Name Target Date Goal status  1 Pt will be able to maintain tandem stance for 20 sec without UE assistance for improved balance and stability.  Baseline: 03/10/2021 INITIAL  2 Pt will deny having falls and report good stability with transfers.  Baseline: 03/10/2021 INITIAL  3 Pt will demo improved hip and knee strength to at least 4+ to 5/5 bilat except 5/5 in hip flexion and 5/5 in L knee extension for improved tolerance to activity and functional mobility.  Baseline: 03/10/2021 INITIAL  4 Pt will be able to perform a reciprocal gait on stairs with the usage of the rail.   Baseline: 03/10/2021 INITIAL                               PLAN:    PLANNED INTERVENTIONS: Therapeutic exercises, Therapeutic activity, Neuro Muscular re-education, Balance training, Gait training, Patient/Family education, Stair training, and Aquatic Therapy   PLAN FOR NEXT SESSION: Cont with therapeutic exercise and neuro re-education activities.      Selinda Michaels III PT, DPT 02/25/21 10:39 PM

## 2021-03-06 ENCOUNTER — Other Ambulatory Visit: Payer: Self-pay | Admitting: Cardiology

## 2021-03-08 ENCOUNTER — Ambulatory Visit: Payer: Medicare PPO | Admitting: Oncology

## 2021-03-09 ENCOUNTER — Encounter (HOSPITAL_BASED_OUTPATIENT_CLINIC_OR_DEPARTMENT_OTHER): Payer: Self-pay | Admitting: Physical Therapy

## 2021-03-09 ENCOUNTER — Other Ambulatory Visit: Payer: Self-pay

## 2021-03-09 ENCOUNTER — Ambulatory Visit (HOSPITAL_BASED_OUTPATIENT_CLINIC_OR_DEPARTMENT_OTHER): Payer: Medicare PPO | Admitting: Physical Therapy

## 2021-03-09 DIAGNOSIS — M6281 Muscle weakness (generalized): Secondary | ICD-10-CM | POA: Diagnosis not present

## 2021-03-09 DIAGNOSIS — R278 Other lack of coordination: Secondary | ICD-10-CM

## 2021-03-09 DIAGNOSIS — Z800 Family history of malignant neoplasm of digestive organs: Secondary | ICD-10-CM | POA: Insufficient documentation

## 2021-03-09 NOTE — Therapy (Signed)
OUTPATIENT PHYSICAL THERAPY TREATMENT NOTE/Discharge Summary   Patient Name: Kendra Ball MRN: [id number] DOB:04/10/1946, 75 y.o., female Today's Date: 03/09/2021  PCP: Kuneff, Renee A, DO REFERRING PROVIDER: Kuneff, Renee A, DO   PT End of Session - 03/09/21 1036     Visit Number 9    Number of Visits 12    Date for PT Re-Evaluation 03/10/21    Authorization Type [employer]    PT Start Time 1031    PT Stop Time 1124    PT Time Calculation (min) 53 min    Equipment Utilized During Treatment Gait belt    Activity Tolerance Patient tolerated treatment well    Behavior During Therapy WFL for tasks assessed/performed                  Past Medical History:  Diagnosis Date   Abnormal mammogram 10/01/2013   Diagnostic MMG & US confirm cyst & duct ectasia of L breast. 08/2013.    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 RF: smoker, fam Hx, sedentary HDL is high!!    Dyspnea on exertion 05/08/2020   Essential hypertension 05/22/2014   Family history of malignant neoplasm of gastrointestinal tract 05/05/2020   Family history of malignant neoplasm of gastrointestinal tract 05/05/2020   Family history of premature coronary heart disease 08/13/2013   Father, brother, sister Personal risk factors: smoker, female over 65, fam Hx, sedentary, thyroid disease, dyslipidemia, BMI over 25.    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:  Effexor XR 225mg po qD for depression and anxiety Wellbutrin SR to 200mg po BID for depression   Xanax 0.5mg po BID and 1mg po qHS prn anxiety       Routine PRN Medications:  Yes    Consultations: encouraged to f/up with PCP as needed, encouraged to start individual therapy   Safety Concerns:  Pt denies SI and is at an acute low risk for suicide.Patient told to call clinic i   Multiple fractures 01/29/2019   Obesity    Osteoporosis    Paresthesia 08/06/2019   Port-A-Cath in place 12/27/2017   Pulmonary nodule seen on imaging study 02/13/2015   Smoker 09/14/2015   Substance abuse (HCC)    inpatient and outpatient tx for substance abuse   TOBACCO USER 12/25/2009   Tremor 05/23/2019     OUTPATIENT PHYSICAL THERAPY TREATMENT NOTE/Discharge Summary   Patient Name: Kendra Ball MRN: [id number] DOB:04/10/1946, 75 y.o., female Today's Date: 03/09/2021  PCP: Kuneff, Renee A, DO REFERRING PROVIDER: Kuneff, Renee A, DO   PT End of Session - 03/09/21 1036     Visit Number 9    Number of Visits 12    Date for PT Re-Evaluation 03/10/21    Authorization Type [employer]    PT Start Time 1031    PT Stop Time 1124    PT Time Calculation (min) 53 min    Equipment Utilized During Treatment Gait belt    Activity Tolerance Patient tolerated treatment well    Behavior During Therapy WFL for tasks assessed/performed                  Past Medical History:  Diagnosis Date   Abnormal mammogram 10/01/2013   Diagnostic MMG & US confirm cyst & duct ectasia of L breast. 08/2013.    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 RF: smoker, fam Hx, sedentary HDL is high!!    Dyspnea on exertion 05/08/2020   Essential hypertension 05/22/2014   Family history of malignant neoplasm of gastrointestinal tract 05/05/2020   Family history of malignant neoplasm of gastrointestinal tract 05/05/2020   Family history of premature coronary heart disease 08/13/2013   Father, brother, sister Personal risk factors: smoker, female over 65, fam Hx, sedentary, thyroid disease, dyslipidemia, BMI over 25.    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:  Effexor XR 225mg po qD for depression and anxiety Wellbutrin SR to 200mg po BID for depression   Xanax 0.5mg po BID and 1mg po qHS prn anxiety       Routine PRN Medications:  Yes    Consultations: encouraged to f/up with PCP as needed, encouraged to start individual therapy   Safety Concerns:  Pt denies SI and is at an acute low risk for suicide.Patient told to call clinic i   Multiple fractures 01/29/2019   Obesity    Osteoporosis    Paresthesia 08/06/2019   Port-A-Cath in place 12/27/2017   Pulmonary nodule seen on imaging study 02/13/2015   Smoker 09/14/2015   Substance abuse (HCC)    inpatient and outpatient tx for substance abuse   TOBACCO USER 12/25/2009   Tremor 05/23/2019     OUTPATIENT PHYSICAL THERAPY TREATMENT NOTE/Discharge Summary   Patient Name: Kendra Ball MRN: [id number] DOB:04/10/1946, 75 y.o., female Today's Date: 03/09/2021  PCP: Kuneff, Renee A, DO REFERRING PROVIDER: Kuneff, Renee A, DO   PT End of Session - 03/09/21 1036     Visit Number 9    Number of Visits 12    Date for PT Re-Evaluation 03/10/21    Authorization Type [employer]    PT Start Time 1031    PT Stop Time 1124    PT Time Calculation (min) 53 min    Equipment Utilized During Treatment Gait belt    Activity Tolerance Patient tolerated treatment well    Behavior During Therapy WFL for tasks assessed/performed                  Past Medical History:  Diagnosis Date   Abnormal mammogram 10/01/2013   Diagnostic MMG & US confirm cyst & duct ectasia of L breast. 08/2013.    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 RF: smoker, fam Hx, sedentary HDL is high!!    Dyspnea on exertion 05/08/2020   Essential hypertension 05/22/2014   Family history of malignant neoplasm of gastrointestinal tract 05/05/2020   Family history of malignant neoplasm of gastrointestinal tract 05/05/2020   Family history of premature coronary heart disease 08/13/2013   Father, brother, sister Personal risk factors: smoker, female over 65, fam Hx, sedentary, thyroid disease, dyslipidemia, BMI over 25.    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:  Effexor XR 225mg po qD for depression and anxiety Wellbutrin SR to 200mg po BID for depression   Xanax 0.5mg po BID and 1mg po qHS prn anxiety       Routine PRN Medications:  Yes    Consultations: encouraged to f/up with PCP as needed, encouraged to start individual therapy   Safety Concerns:  Pt denies SI and is at an acute low risk for suicide.Patient told to call clinic i   Multiple fractures 01/29/2019   Obesity    Osteoporosis    Paresthesia 08/06/2019   Port-A-Cath in place 12/27/2017   Pulmonary nodule seen on imaging study 02/13/2015   Smoker 09/14/2015   Substance abuse (HCC)    inpatient and outpatient tx for substance abuse   TOBACCO USER 12/25/2009   Tremor 05/23/2019     OUTPATIENT PHYSICAL THERAPY TREATMENT NOTE/Discharge Summary   Patient Name: Kendra Ball MRN: [id number] DOB:04/10/1946, 75 y.o., female Today's Date: 03/09/2021  PCP: Kuneff, Renee A, DO REFERRING PROVIDER: Kuneff, Renee A, DO   PT End of Session - 03/09/21 1036     Visit Number 9    Number of Visits 12    Date for PT Re-Evaluation 03/10/21    Authorization Type [employer]    PT Start Time 1031    PT Stop Time 1124    PT Time Calculation (min) 53 min    Equipment Utilized During Treatment Gait belt    Activity Tolerance Patient tolerated treatment well    Behavior During Therapy WFL for tasks assessed/performed                  Past Medical History:  Diagnosis Date   Abnormal mammogram 10/01/2013   Diagnostic MMG & US confirm cyst & duct ectasia of L breast. 08/2013.    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 RF: smoker, fam Hx, sedentary HDL is high!!    Dyspnea on exertion 05/08/2020   Essential hypertension 05/22/2014   Family history of malignant neoplasm of gastrointestinal tract 05/05/2020   Family history of malignant neoplasm of gastrointestinal tract 05/05/2020   Family history of premature coronary heart disease 08/13/2013   Father, brother, sister Personal risk factors: smoker, female over 65, fam Hx, sedentary, thyroid disease, dyslipidemia, BMI over 25.    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:  Effexor XR 225mg po qD for depression and anxiety Wellbutrin SR to 200mg po BID for depression   Xanax 0.5mg po BID and 1mg po qHS prn anxiety       Routine PRN Medications:  Yes    Consultations: encouraged to f/up with PCP as needed, encouraged to start individual therapy   Safety Concerns:  Pt denies SI and is at an acute low risk for suicide.Patient told to call clinic i   Multiple fractures 01/29/2019   Obesity    Osteoporosis    Paresthesia 08/06/2019   Port-A-Cath in place 12/27/2017   Pulmonary nodule seen on imaging study 02/13/2015   Smoker 09/14/2015   Substance abuse (HCC)    inpatient and outpatient tx for substance abuse   TOBACCO USER 12/25/2009   Tremor 05/23/2019     OUTPATIENT PHYSICAL THERAPY TREATMENT NOTE/Discharge Summary   Patient Name: Kendra Ball MRN: [id number] DOB:04/10/1946, 75 y.o., female Today's Date: 03/09/2021  PCP: Kuneff, Renee A, DO REFERRING PROVIDER: Kuneff, Renee A, DO   PT End of Session - 03/09/21 1036     Visit Number 9    Number of Visits 12    Date for PT Re-Evaluation 03/10/21    Authorization Type [employer]    PT Start Time 1031    PT Stop Time 1124    PT Time Calculation (min) 53 min    Equipment Utilized During Treatment Gait belt    Activity Tolerance Patient tolerated treatment well    Behavior During Therapy WFL for tasks assessed/performed                  Past Medical History:  Diagnosis Date   Abnormal mammogram 10/01/2013   Diagnostic MMG & US confirm cyst & duct ectasia of L breast. 08/2013.    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 RF: smoker, fam Hx, sedentary HDL is high!!    Dyspnea on exertion 05/08/2020   Essential hypertension 05/22/2014   Family history of malignant neoplasm of gastrointestinal tract 05/05/2020   Family history of malignant neoplasm of gastrointestinal tract 05/05/2020   Family history of premature coronary heart disease 08/13/2013   Father, brother, sister Personal risk factors: smoker, female over 65, fam Hx, sedentary, thyroid disease, dyslipidemia, BMI over 25.    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:  Effexor XR 225mg po qD for depression and anxiety Wellbutrin SR to 200mg po BID for depression   Xanax 0.5mg po BID and 1mg po qHS prn anxiety       Routine PRN Medications:  Yes    Consultations: encouraged to f/up with PCP as needed, encouraged to start individual therapy   Safety Concerns:  Pt denies SI and is at an acute low risk for suicide.Patient told to call clinic i   Multiple fractures 01/29/2019   Obesity    Osteoporosis    Paresthesia 08/06/2019   Port-A-Cath in place 12/27/2017   Pulmonary nodule seen on imaging study 02/13/2015   Smoker 09/14/2015   Substance abuse (HCC)    inpatient and outpatient tx for substance abuse   TOBACCO USER 12/25/2009   Tremor 05/23/2019   

## 2021-03-10 ENCOUNTER — Ambulatory Visit (INDEPENDENT_AMBULATORY_CARE_PROVIDER_SITE_OTHER): Payer: Medicare PPO | Admitting: Family Medicine

## 2021-03-10 ENCOUNTER — Inpatient Hospital Stay: Payer: Medicare PPO | Attending: Oncology | Admitting: Oncology

## 2021-03-10 ENCOUNTER — Encounter: Payer: Self-pay | Admitting: Family Medicine

## 2021-03-10 VITALS — BP 126/75 | HR 73 | Temp 97.3°F | Wt 172.0 lb

## 2021-03-10 VITALS — BP 125/56 | HR 82 | Temp 98.7°F | Resp 18 | Ht 64.0 in | Wt 173.6 lb

## 2021-03-10 DIAGNOSIS — L03011 Cellulitis of right finger: Secondary | ICD-10-CM | POA: Diagnosis not present

## 2021-03-10 DIAGNOSIS — E7800 Pure hypercholesterolemia, unspecified: Secondary | ICD-10-CM | POA: Diagnosis not present

## 2021-03-10 DIAGNOSIS — F17210 Nicotine dependence, cigarettes, uncomplicated: Secondary | ICD-10-CM | POA: Diagnosis not present

## 2021-03-10 DIAGNOSIS — Z79899 Other long term (current) drug therapy: Secondary | ICD-10-CM | POA: Diagnosis not present

## 2021-03-10 DIAGNOSIS — C8330 Diffuse large B-cell lymphoma, unspecified site: Secondary | ICD-10-CM | POA: Diagnosis not present

## 2021-03-10 DIAGNOSIS — C8338 Diffuse large B-cell lymphoma, lymph nodes of multiple sites: Secondary | ICD-10-CM

## 2021-03-10 DIAGNOSIS — Z800 Family history of malignant neoplasm of digestive organs: Secondary | ICD-10-CM | POA: Diagnosis not present

## 2021-03-10 DIAGNOSIS — I10 Essential (primary) hypertension: Secondary | ICD-10-CM | POA: Diagnosis not present

## 2021-03-10 DIAGNOSIS — M79644 Pain in right finger(s): Secondary | ICD-10-CM

## 2021-03-10 DIAGNOSIS — E039 Hypothyroidism, unspecified: Secondary | ICD-10-CM | POA: Insufficient documentation

## 2021-03-10 DIAGNOSIS — Z808 Family history of malignant neoplasm of other organs or systems: Secondary | ICD-10-CM | POA: Diagnosis not present

## 2021-03-10 MED ORDER — FLUTICASONE-SALMETEROL 250-50 MCG/ACT IN AEPB: 1.0000 | INHALATION_SPRAY | Freq: Two times a day (BID) | RESPIRATORY_TRACT | 11 refills | Status: DC

## 2021-03-10 MED ORDER — DOXYCYCLINE HYCLATE 100 MG PO TABS: 100.0000 mg | ORAL_TABLET | Freq: Two times a day (BID) | ORAL | 0 refills | Status: DC

## 2021-03-10 MED ORDER — CLOTRIMAZOLE 1 % EX CREA: 1.0000 "application " | TOPICAL_CREAM | Freq: Two times a day (BID) | CUTANEOUS | 0 refills | Status: DC

## 2021-03-10 NOTE — Progress Notes (Signed)
[facility] OFFICE PROGRESS NOTE   Diagnosis: Non-Hodgkin's lymphoma  INTERVAL HISTORY:   Kendra Ball returns as scheduled.  She feels well.  Good appetite.  No fever, night sweats, or palpable lymph nodes.  She has noted firm prominence near the right elbow.  She continues smoking.  She plans to try quitting by using a nicotine patch.  Objective:  Vital signs in last 24 hours:  Blood pressure (!) 125/56, pulse 82, temperature 98.7 F (37.1 C), temperature source Oral, resp. rate 18, height 5\' 4"  (1.626 m), weight 173 lb 9.6 oz (78.7 kg), SpO2 100 %.    HEENT: Neck without mass Lymphatics: No cervical, supraclavicular, axillary, or inguinal nodes Resp: Distant breath sounds, no respiratory distress Cardio: Regular rate and rhythm GI: No mass, no hepatosplenomegaly, nontender Vascular: No leg edema Musculoskeletal: The left lower arm is slightly larger than the right side, the medial aspect of the right olecranon is easier to palpate than on the left side.  No mass.  Portacath/PICC-without erythema Lab Results:  Lab Results  Component Value Date   WBC 6.7 01/19/2021   HGB 14.2 01/19/2021   HCT 43.5 01/19/2021   MCV 101.7 (H) 01/19/2021   PLT 213.0 01/19/2021   NEUTROABS 4.7 01/19/2021    CMP  Lab Results  Component Value Date   NA 141 01/19/2021   K 4.3 01/19/2021   CL 106 01/19/2021   CO2 26 01/19/2021   GLUCOSE 89 01/19/2021   BUN 9 01/19/2021   CREATININE 0.73 01/19/2021   CALCIUM 9.3 01/19/2021   PROT 6.1 01/19/2021   ALBUMIN 4.0 01/19/2021   AST 22 01/19/2021   ALT 19 01/19/2021   ALKPHOS 42 01/19/2021   BILITOT 0.5 01/19/2021   GFRNONAA >60 07/03/2018   GFRAA >60 07/03/2018     Medications: I have reviewed the patient's current medications.   Assessment/Plan: Non-Hodgkin's lymphoma, diffuse large B-cell lymphoma, CD20 positive Ultrasound of the neck 02/18/2017- right supraclavicular adenopathy with at least 3 nodes, largest  measuring 1.3 x 1.1 x 1.4 cm; 2 adjacent hypoechoic right occipital lymph nodes Excisional biopsy right supraclavicular lymphadenopathy 74/05/5954 (Dr. Deetta Perla lymphoid proliferation.  Necrotic soft tissue with associated acute and chronic inflammation.  By flow cytometry no monoclonal B-cell or phenotypically aberrant T-cell population.  Special stains for microorganisms including AFB, GMS, PAS and Warthin-Starry, are negative. CT chest 05/30/2017- multiple enlarged right axillary lymph nodes.  Largest right axillary lymph node measures 2.6 x 2.2 cm.  Single enlarged left axillary lymph node measuring 16 mm. CT neck 06/03/2017- known bilateral axillary lymphadenopathy partially covered on the scan.  No lymphadenopathy seen in the neck. Biopsy right axillary lymph node 06/22/2017 (Dr. Georgette Dover)- fibroadipose tissue with necrosis, chronic inflammation and granulation tissue PET scan 3/87/5643- hypermetabolic bilateral axillary lymph nodes.  Hypermetabolic but small right supraclavicular lymph nodes.  In the right omentum there is a small focus of rim density and internal fat density which is hypermetabolic. CT neck 10/25/2017- progressed bilateral subclavian lymphadenopathy since February, right greater than left.  Conspicuous enlargement of the small lymph nodes at the right level 2 and 3 nodal stations. CT chest/abdomen/pelvis 10/25/2017- progressive adenopathy identified within the left axilla, bilateral supraclavicular stations and left inguinal region.  Persistent but improved right axillary adenopathy.  Scattered indeterminate low-attenuation foci within the spleen not seen on study from 05/30/2017. Excisional biopsy of right supraclavicular and posterior triangle lymph nodes on 11/16/2017-diffuse large B-cell lymphoma, CD20 positive PET scan 11/28/2017- new hypermetabolic lymph nodes and progressive  hypermetabolism in existing lymph nodes.  Hypermetabolic focus along the anterior aspect of the right scapula  with adjacent soft tissue mass.  No new adenopathy involving the mediastinal or hilar regions of the chest or the abdomen or pelvis.  No obvious involvement of the liver or spleen.  IPI score 3 Cycle 1 CHOP/Rituxan 12/04/2017 Cycle 2 CHOP/Rituxan 12/27/2017 Cycle 3 CHOP/Rituxan 01/16/2018  Cycle 4 CHOP/Rituxan 02/05/2018 PET 02/19/2018- resolution of previously noted hypermetabolic adenopathy, no residual hypermetabolic osseous lesions Cycle 5 CHOP/rituximab 02/27/2018 Cycle 6 CHOP/rituximab 03/20/2018 Hypertension  Hypothyroid Hypercholesterolemia Family history significant for colon cancer, stomach cancer and brain cancer Ongoing tobacco use Port-A-Cath placement 12/01/2017, Interventional Radiology Left arm edema-negative venous Doppler 02/26/2018      Disposition: Kendra Ball is in clinical remission from non-Hodgkin's lymphoma.  She will return for an office visit in 6 months.  She will be scheduled for a screening chest CT next month.  Betsy Coder, MD  03/10/2021  9:57 AM

## 2021-03-10 NOTE — Patient Instructions (Signed)
Start doxycyline every 12 hours with food, for 7 days. Put cream on finger as discussed twice a day, after soaking in epson salt and warm water for 10 minutes.    Paronychia Paronychia is an infection of the skin. It happens near a fingernail or toenail. It may cause pain and swelling around the nail. In some cases, a fluid-filled bump (abscess) can form near or under the nail. Often, this condition is not serious, and it clears up with treatment. What are the causes? This condition may be caused by a germ. The germ may be bacteria or a fungus. These germs can enter the body through an opening in the skin, such as a cut or a hangnail. Other causes include: Repeated injuries to your fingernails or toenails. Irritation of the base and sides of the nail (cuticle). What increases the risk? This condition is more likely to develop in people who: Get their hands wet often, such as a dishwasher. Bite their fingernails or the base and sides of their nails. Have other skin problems. Have hangnails or hurt fingertips. Come into contact with chemicals like detergents. Have diabetes. What are the signs or symptoms? Redness and swelling of the skin near the nail. A tender feeling around the nail. Pus-filled bumps under the skin at the base and sides of the nail. Fluid or pus under the nail. Pain in the area. How is this treated? Treatment depends on the cause of your condition and how bad it is. If your condition is mild, it may clear up on its own in a few days or after soaking in warm water. If needed, treatment may include: Antibiotic medicine. Antifungal medicine. A procedure to drain pus from a fluid-filled bump. Medicine to treat irritation and swelling (corticosteroids). Taking off part of an ingrown toenail. A bandage (dressing) may be placed over the nail area. Follow these instructions at home: Wound care Keep the affected area clean. Soak the fingers or toes in warm water as told by  your doctor. You may be told to do this for 20 minutes, 2-3 times a day. Keep the area dry when you are not soaking it. Do not try to drain a fluid-filled bump on your own. Follow instructions from your doctor about how to take care of the affected area. Make sure you: Wash your hands with soap and water for at least 20 seconds before and after you change your bandage. If you cannot use soap and water, use hand sanitizer. Change your bandage as told by your doctor. If you had a fluid-filled bump and your doctor drained it, check the area every day for signs of infection. Check for: Redness, swelling, or pain. Fluid or blood. Warmth. Pus or a bad smell. Medicines  Take over-the-counter and prescription medicines only as told by your doctor. If you were prescribed an antibiotic medicine, take it as told by your doctor. Do not stop taking it even if you start to feel better. General instructions Avoid contact with anything that irritates your skin or that you are allergic to. Do not pick at the affected area. Keep all follow-up visits. Prevention To prevent this condition from happening again: Wear rubber gloves when putting your hands in water for washing dishes or other tasks. Wear gloves if your hands might touch cleaners or chemicals. Avoid injuring your nails or fingertips. Do not bite your nails or tear hangnails. Do not cut your nails very short. Do not cut the skin at the base and sides of  the nail. Use clean nail clippers or scissors when trimming nails. Contact a doctor if: You feel worse. You do not get better. You keep having or you have more fluid, blood, or pus coming from the affected area. Your affected finger, toe, or joint gets swollen or hard to move. You have a fever or chills. There is redness spreading from the affected area. Summary Paronychia is an infection of the skin. It happens near a fingernail or toenail. This condition may cause pain and swelling  around the nail. Soak the fingers or toes in warm water as told by your doctor. Often, this condition is not serious, and it clears up with treatment. This information is not intended to replace advice given to you by your health care provider. Make sure you discuss any questions you have with your health care provider. Document Revised: 07/13/2020 Document Reviewed: 07/13/2020 Elsevier Patient Education  [place].

## 2021-03-10 NOTE — Progress Notes (Signed)
This visit occurred during the SARS-CoV-2 public health emergency.  Safety protocols were in place, including screening questions prior to the visit, additional usage of staff PPE, and extensive cleaning of exam room while observing appropriate contact time as indicated for disinfecting solutions.    Kendra Ball , July 07, 1945, 75 y.o., female MRN: [id number] Patient Care Team    Relationship Specialty Notifications Start End  Ma Hillock, DO PCP - General Family Medicine  12/30/14   Juanita Craver, MD Consulting Physician Gastroenterology  05/09/14   Excell Seltzer, MD (Inactive) Consulting Physician General Surgery  05/09/14   Brandon Melnick, [place] (Inactive)  Psychology  10/16/15   [name], MD  Psychiatry  12/12/16   Ladell Pier, MD Consulting Physician Oncology  04/23/20     Chief Complaint  Patient presents with   Finger Injury    Pt reports R 3 digit nail and finger redness, swelling, pain and warmth x 6 weeks     Subjective: Pt presents for an OV with complaints of right middle finger with redness, swelling, nail deformity and skin changes of 6 weeks duration.  She tried to use over-the-counter antibacterial cream and keeping it covered with a Band-Aid.  She is also been soaking in peroxide.  She reports the swelling is a little better, but is not resolving with her at home treatments.  Her skin is now peeling and the nail has started pitting and scaling.  She is a smoker. Depression screen [facility] 2/9 01/19/2021 11/25/2020 03/24/2020 04/10/2018 02/16/2017  Decreased Interest 0 0 0 0 0  Down, Depressed, Hopeless 0 0 0 0 0  PHQ - 2 Score 0 0 0 0 0  Altered sleeping 0 - 0 0 -  Tired, decreased energy 2 - 0 2 -  Change in appetite 0 - 0 2 -  Feeling bad or failure about yourself  0 - 0 0 -  Trouble concentrating 0 - 0 2 -  Moving slowly or fidgety/restless 0 - 0 0 -  Suicidal thoughts 0 - 0 - -  PHQ-9 Score 2 - 0 6 -  Difficult doing work/chores - - - Not difficult at  all -  Some encounter information is confidential and restricted. Go to Review Flowsheets activity to see all data.  Some recent data might be hidden    Allergies  Allergen Reactions   Morphine Sulfate Swelling and Other (See Comments)    Swelling around injection area   Social History   Social History Narrative   Ms. Jezek lives in [place] with her husband. She has 3 grown children and several grand children. She has joint custody of 3 of her grand children as her daughter has bipolar disorder and has needed assistance with children in past.   1-2 cups coffee, 3-6 cups tea per day.   Right-handed.   Past Medical History:  Diagnosis Date   Abnormal mammogram 10/01/2013   Diagnostic [facility] & [place] confirm cyst & duct ectasia of L breast. 08/2013.    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 RF: smoker, fam Hx, sedentary HDL is high!!    Dyspnea on exertion 05/08/2020   Essential hypertension 05/22/2014   Family history of malignant neoplasm of gastrointestinal tract 05/05/2020   Family history of malignant neoplasm of gastrointestinal tract 05/05/2020   Family history of premature coronary heart disease 08/13/2013   Father, brother, sister Personal risk factors: smoker, female over 73, fam Hx, sedentary, thyroid disease, dyslipidemia, BMI over 25.    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     Insomnia 05/05/2015   Iron deficiency anemia 05/05/2020   Lymphoma ([place]) 2019   B-cell   MDD (major depressive disorder), recurrent episode, moderate ([place]) 01/30/2014   [facility] 05/2015 08/18/2015:  Effexor XR 225mg  po qD for depression and anxiety Wellbutrin SR to 200mg  po BID for depression   Xanax 0.5mg  po BID and 1mg  po qHS prn anxiety       Routine PRN Medications:  Yes    Consultations: encouraged to f/up with PCP as needed, encouraged to start individual therapy   Safety Concerns:  Pt denies SI and is at an acute low risk for suicide.Patient told to call clinic i   Multiple fractures 01/29/2019   Obesity    Osteoporosis    Paresthesia 08/06/2019   Port-A-Cath in place 12/27/2017   Pulmonary nodule seen on imaging study 02/13/2015   Smoker 09/14/2015   Substance abuse [facility])    inpatient and outpatient tx for substance abuse   TOBACCO USER 12/25/2009   Tremor 05/23/2019   Past Surgical History:  Procedure Laterality Date   AXILLARY LYMPH NODE BIOPSY Right 06/22/2017   Procedure: RIGHT AXILLARY LYMPH NODE BIOPSY;  Surgeon: [name], MD;  Location: [facility];  Service: General;  Laterality: Right;   BARIATRIC SURGERY  2006   CESAREAN SECTION     COLON RESECTION N/A 05/09/2014   Procedure: LAPAROSCOPIC HAND-ASSISTED EXTENDED RIGHT COLECTOMY, LAPAROSCOPIC LYSIS OF ADHESIONS, SPLENIC FLEXURE MOBILIZATION.;  Surgeon: [name], MD;  Location: [facility];  Service: General;  Laterality: N/A;   DILATION AND CURETTAGE OF UTERUS     x2   FOREARM FRACTURE SURGERY     right   GASTRIC BYPASS  2006   IR IMAGING GUIDED PORT INSERTION  12/01/2017   IR REMOVAL TUN ACCESS W/ PORT W/O FL MOD SED   07/03/2018   MASS EXCISION Right 02/24/2017   Procedure: EXCISIONAL BIOPSY RIGHT SUPRA-CLAVICULAR NODE;  Surgeon: [name], MD;  Location: [facility];  Service: ENT;  Laterality: Right;   TONSILLECTOMY     Family History  Problem Relation Age of Onset   Cancer Mother        colon   Depression Mother    Alzheimer's disease Mother        Dementia   Heart disease Father 70   Heart disease Sister 63       stents, pacemaker   Depression Sister    Heart disease Brother 42  CABG, MI   Depression Brother    Obesity Daughter    Suicidality Daughter    Bipolar disorder Daughter    Obesity Son    Mental illness Daughter        bipolar   Obesity Daughter    Depression Sister    Dementia Paternal Aunt    Alcohol abuse Maternal Uncle    Colon cancer Maternal Aunt    Stomach cancer Maternal Grandfather    Allergies as of 03/10/2021       Reactions   Morphine Sulfate Swelling, Other (See Comments)   Swelling around injection area        Medication List        Accurate as of March 10, 2021  4:08 PM. If you have any questions, ask your nurse or doctor.          albuterol 108 (90 Base) MCG/ACT inhaler Commonly known as: VENTOLIN HFA Inhale 2 puffs into the lungs every 6 (six) hours as needed for wheezing or shortness of breath.   alendronate 70 MG tablet Commonly known as: FOSAMAX Take 1 tablet (70 mg total) by mouth every 7 (seven) days. Take with a full glass of water on an empty stomach.   aspirin EC 81 MG tablet Take 1 tablet (81 mg total) by mouth daily. Swallow whole.   buPROPion 150 MG 12 hr tablet Commonly known as: WELLBUTRIN SR Take 1 tablet (150 mg total) by mouth 2 (two) times daily.   busPIRone 10 MG tablet Commonly known as: BUSPAR Take 1 tablet (10 mg total) by mouth 3 (three) times daily.   Cyanocobalamin 2500 MCG Subl 1 tab sublingual daily What changed:  how much to take how to take this when to take this   ferrous  fumarate 325 (106 Fe) MG Tabs tablet Commonly known as: HEMOCYTE - 106 mg FE Take 1 tablet by mouth daily. Reported on 10/06/2015   levothyroxine 100 MCG tablet Commonly known as: SYNTHROID Take 1 tablet (100 mcg total) by mouth daily before breakfast.   Magnesium 250 MG Tabs Take 250 mg by mouth daily.   ranolazine 500 MG 12 hr tablet Commonly known as: RANEXA TAKE 1 TABLET BY MOUTH TWICE A DAY   rosuvastatin 10 MG tablet Commonly known as: CRESTOR Take 1 tablet (10 mg total) by mouth daily.   traZODone 100 MG tablet Commonly known as: DESYREL Take 2 tablets (200 mg total) by mouth at bedtime.   venlafaxine XR 150 MG 24 hr capsule Commonly known as: EFFEXOR-XR Take 1 capsule (150 mg total) by mouth daily with breakfast.        All past medical history, surgical history, allergies, family history, immunizations andmedications were updated in the EMR today and reviewed under the history and medication portions of their EMR.     ROS: Negative, with the exception of above mentioned in HPI   Objective:  BP 126/75   Pulse 73   Temp (!) 97.3 F (36.3 C) (Oral)   Wt 172 lb (78 kg)   SpO2 94%   BMI 29.52 kg/m  Body mass index is 29.52 kg/m. Gen: Afebrile. No acute distress. Nontoxic in appearance, well developed, well nourished.  HENT: AT. .  MSK: Right hand, middle finger with erythema and swelling distal end of finger from DIP to tip.  Swelling surrounding matrix/nail fold and lateral nail fold.  No drainage appreciated.  Nail has mild deformity with scaly/peeling look to it.  Mild yellow discoloration mid nail to  distal end.  Tenderness to palpation.  Neurovascular intact distally. Neuro:Normal gait. PERLA. EOMi. Alert. Oriented x3  Psych: Normal affect, dress and demeanor. Normal speech. Normal thought content and judgment.  No results found. No results found. No results found for this or any previous visit (from the past 24 hour(s)).  Assessment/Plan: JAIYANNA SAFRAN is a 75 y.o. female present for OV for  Paronychia of finger of right hand/Finger pain, right Patient with mild paronychia infection possibly related to fungal infection.  The nail has discoloration mid nail to the distal tip, but also can be tobacco staining.  There is a scaliness surrounding her nail and her finger distal pad, which could be secondary to the use of over-the-counter antibacterial ointment and Band-Aids for 6 weeks. Difficult to be certain this is fungal related versus bacterial paronychia. Warm water and Epson soaks 10-15 minutes twice daily.  Stop peroxide use. Apply clotrimazole cream liberally around distal end of finger and nail fold, as well as nail.  Stop her over-the-counter antibiotic ointment. Elected to treat with doxycycline twice daily x7 days If electing to keep finger covered she was encouraged to use [place] in place at [place]. Follow-up in 2 weeks for reevaluation.  If felt to be fungal related could consider terbinafine 6-week treatment-however other nails are not involved at this time.   Reviewed expectations re: course of current medical issues. Discussed self-management of symptoms. Outlined signs and symptoms indicating need for more acute intervention. Patient verbalized understanding and all questions were answered. Patient received an After-Visit Summary.    No orders of the defined types were placed in this encounter.  No orders of the defined types were placed in this encounter.  Referral Orders  No referral(s) requested today     Note is dictated utilizing voice recognition software. Although note has been proof read prior to signing, occasional typographical errors still can be missed. If any questions arise, please do not hesitate to call for verification.   electronically signed by:  Howard Pouch, DO  [place]

## 2021-03-11 ENCOUNTER — Encounter (HOSPITAL_BASED_OUTPATIENT_CLINIC_OR_DEPARTMENT_OTHER): Payer: Self-pay | Admitting: Physical Therapy

## 2021-03-16 ENCOUNTER — Other Ambulatory Visit: Payer: Self-pay | Admitting: Psychiatry

## 2021-03-16 DIAGNOSIS — J449 Chronic obstructive pulmonary disease, unspecified: Secondary | ICD-10-CM

## 2021-03-22 ENCOUNTER — Other Ambulatory Visit: Payer: Self-pay | Admitting: *Deleted

## 2021-03-22 DIAGNOSIS — C8338 Diffuse large B-cell lymphoma, lymph nodes of multiple sites: Secondary | ICD-10-CM

## 2021-03-31 ENCOUNTER — Other Ambulatory Visit: Payer: Self-pay

## 2021-03-31 MED ORDER — ALENDRONATE SODIUM 70 MG PO TABS: 70.0000 mg | ORAL_TABLET | ORAL | 0 refills | Status: DC

## 2021-04-09 ENCOUNTER — Other Ambulatory Visit: Payer: Self-pay | Admitting: Family Medicine

## 2021-04-09 ENCOUNTER — Telehealth: Payer: Self-pay | Admitting: Family Medicine

## 2021-04-09 MED ORDER — CLOTRIMAZOLE 1 % EX CREA: 1.0000 "application " | TOPICAL_CREAM | Freq: Two times a day (BID) | CUTANEOUS | 0 refills | Status: DC

## 2021-04-09 NOTE — Telephone Encounter (Signed)
LM for pt regarding med refill °

## 2021-04-09 NOTE — Telephone Encounter (Signed)
Pt advised during last OV 11/16, provider advised 2 week reevaluation and no appt scheduled. Pt offered appt, agreeable and appt scheduled for 12/19.

## 2021-04-12 ENCOUNTER — Ambulatory Visit (INDEPENDENT_AMBULATORY_CARE_PROVIDER_SITE_OTHER): Payer: Medicare PPO | Admitting: Family Medicine

## 2021-04-12 ENCOUNTER — Encounter: Payer: Self-pay | Admitting: Family Medicine

## 2021-04-12 ENCOUNTER — Other Ambulatory Visit: Payer: Self-pay

## 2021-04-12 VITALS — BP 105/62 | HR 78 | Temp 98.2°F | Ht 64.0 in | Wt 172.0 lb

## 2021-04-12 DIAGNOSIS — L03011 Cellulitis of right finger: Secondary | ICD-10-CM

## 2021-04-12 DIAGNOSIS — B351 Tinea unguium: Secondary | ICD-10-CM | POA: Diagnosis not present

## 2021-04-12 MED ORDER — CLOTRIMAZOLE 1 % EX CREA: 1.0000 "application " | TOPICAL_CREAM | Freq: Two times a day (BID) | CUTANEOUS | 11 refills | Status: DC

## 2021-04-12 NOTE — Patient Instructions (Addendum)
Great to see you today.  I have refilled the medication(s) we provide.   If labs were collected, we will inform you of lab results once received either by echart message or telephone call.   - echart message- for normal results that have been seen by the patient already.   - telephone call: abnormal results or if patient has not viewed results in their echart.  

## 2021-04-12 NOTE — Progress Notes (Signed)
This visit occurred during the SARS-CoV-2 public health emergency.  Safety protocols were in place, including screening questions prior to the visit, additional usage of staff PPE, and extensive cleaning of exam room while observing appropriate contact time as indicated for disinfecting solutions.    Kendra Ball , December 08, 1945, 75 y.o., female MRN: [id number] Patient Care Team    Relationship Specialty Notifications Start End  Ma Hillock, DO PCP - General Family Medicine  12/30/14   Juanita Craver, MD Consulting Physician Gastroenterology  05/09/14   Excell Seltzer, MD (Inactive) Consulting Physician General Surgery  05/09/14   Brandon Melnick, [place] (Inactive)  Psychology  10/16/15   [name], MD  Psychiatry  12/12/16   Ladell Pier, MD Consulting Physician Oncology  04/23/20     Chief Complaint  Patient presents with   paronychia    F/u     Subjective: Pt presents for an OV to discuss right middle finger paronychia.  She had been treated with doxy bid, encourage to soak in warm water/epson salt daily and apply clotrimazole cream. She reports her finger is much better. The is no longer redness or pain associated. She still has dry cracked skin around her finger. SHe reports this also was resolving with clotrimazole cream, but she had been needing the bendaid use again when she was making her xmas cards.    Prior note: with complaints of right middle finger with redness, swelling, nail deformity and skin changes of 6 weeks duration.  She tried to use over-the-counter antibacterial cream and keeping it covered with a Band-Aid.  She is also been soaking in peroxide.  She reports the swelling is a little better, but is not resolving with her at home treatments.  Her skin is now peeling and the nail has started pitting and scaling.  She is a smoker. Depression screen [facility] 2/9 01/19/2021 11/25/2020 03/24/2020 04/10/2018 02/16/2017  Decreased Interest 0 0 0 0 0  Down, Depressed,  Hopeless 0 0 0 0 0  PHQ - 2 Score 0 0 0 0 0  Altered sleeping 0 - 0 0 -  Tired, decreased energy 2 - 0 2 -  Change in appetite 0 - 0 2 -  Feeling bad or failure about yourself  0 - 0 0 -  Trouble concentrating 0 - 0 2 -  Moving slowly or fidgety/restless 0 - 0 0 -  Suicidal thoughts 0 - 0 - -  PHQ-9 Score 2 - 0 6 -  Difficult doing work/chores - - - Not difficult at all -  Some encounter information is confidential and restricted. Go to Review Flowsheets activity to see all data.  Some recent data might be hidden    Allergies  Allergen Reactions   Morphine Sulfate Swelling and Other (See Comments)    Swelling around injection area   Social History   Social History Narrative   Kendra Ball lives in [place] with her husband. She has 3 grown children and several grand children. She has joint custody of 3 of her grand children as her daughter has bipolar disorder and has needed assistance with children in past.   1-2 cups coffee, 3-6 cups tea per day.   Right-handed.   Past Medical History:  Diagnosis Date   Abnormal mammogram 10/01/2013   Diagnostic [facility] & [place] confirm cyst & duct ectasia of L breast. 08/2013.    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 RF: smoker, fam Hx, sedentary HDL is high!!    Dyspnea on exertion 05/08/2020   Essential hypertension 05/22/2014   Family history of malignant neoplasm of gastrointestinal tract 05/05/2020    Family history of malignant neoplasm of gastrointestinal tract 05/05/2020   Family history of premature coronary heart disease 08/13/2013   Father, brother, sister Personal risk factors: smoker, female over 22, fam Hx, sedentary, thyroid disease, dyslipidemia, BMI over 25.    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     Insomnia 05/05/2015   Iron deficiency anemia 05/05/2020   Lymphoma ([place]) 2019   B-cell   MDD (major depressive disorder), recurrent episode, moderate ([place]) 01/30/2014   [facility] 05/2015 08/18/2015:  Effexor XR 225mg  po qD for depression and anxiety Wellbutrin SR to 200mg  po BID for depression   Xanax 0.5mg  po BID and 1mg  po qHS prn anxiety       Routine PRN Medications:  Yes    Consultations: encouraged to f/up with PCP as needed, encouraged to start individual therapy   Safety Concerns:  Pt denies SI and is at an acute low risk for suicide.Patient told to call clinic i   Multiple fractures 01/29/2019   Obesity    Osteoporosis    Paresthesia 08/06/2019   Port-A-Cath in place 12/27/2017   Pulmonary nodule seen on imaging study 02/13/2015   Smoker 09/14/2015   Substance abuse [facility])    inpatient and outpatient tx for substance abuse   TOBACCO USER 12/25/2009   Tremor 05/23/2019   Past Surgical History:  Procedure Laterality Date   AXILLARY LYMPH NODE BIOPSY Right 06/22/2017   Procedure: RIGHT AXILLARY LYMPH NODE BIOPSY;  Surgeon: [name], MD;  Location: [facility];  Service: General;  Laterality: Right;   BARIATRIC SURGERY  2006   CESAREAN SECTION     COLON RESECTION N/A 05/09/2014   Procedure: LAPAROSCOPIC HAND-ASSISTED EXTENDED  RIGHT COLECTOMY, LAPAROSCOPIC LYSIS OF ADHESIONS, SPLENIC FLEXURE MOBILIZATION.;  Surgeon: [name], MD;  Location: [facility];  Service: General;  Laterality: N/A;   DILATION AND CURETTAGE OF UTERUS     x2   FOREARM FRACTURE SURGERY     right   GASTRIC BYPASS  2006   IR IMAGING GUIDED PORT INSERTION  12/01/2017   IR REMOVAL TUN ACCESS W/ PORT W/O FL MOD SED  07/03/2018   MASS EXCISION Right 02/24/2017   Procedure: EXCISIONAL BIOPSY RIGHT SUPRA-CLAVICULAR NODE;  Surgeon: [name], MD;  Location: [facility];  Service: ENT;  Laterality: Right;   TONSILLECTOMY     Family History  Problem Relation Age of  Onset   Cancer Mother        colon   Depression Mother    Alzheimer's disease Mother        Dementia   Heart disease Father 60   Heart disease Sister 45       stents, pacemaker   Depression Sister    Heart disease Brother 61       CABG, MI   Depression Brother    Obesity Daughter    Suicidality Daughter    Bipolar disorder Daughter    Obesity Son    Mental illness Daughter        bipolar   Obesity Daughter    Depression Sister    Dementia Paternal Aunt    Alcohol abuse Maternal Uncle    Colon cancer Maternal Aunt    Stomach cancer Maternal Grandfather    Allergies as of 04/12/2021       Reactions   Morphine Sulfate Swelling, Other (See Comments)   Swelling around injection area        Medication List        Accurate as of April 12, 2021  4:07 PM. If you have any questions, ask your nurse or doctor.          albuterol 108 (90 Base) MCG/ACT inhaler Commonly known as: VENTOLIN HFA Inhale 2 puffs into the lungs every 6 (six) hours as needed for wheezing or shortness of breath.   alendronate 70 MG tablet Commonly known as: FOSAMAX Take 1 tablet (70 mg total) by mouth every 7 (seven) days. Take with a full glass of water on an empty stomach.   aspirin EC 81 MG tablet Take 1 tablet (81 mg total) by mouth daily. Swallow whole.   buPROPion  150 MG 12 hr tablet Commonly known as: WELLBUTRIN SR Take 1 tablet (150 mg total) by mouth 2 (two) times daily.   busPIRone 10 MG tablet Commonly known as: BUSPAR Take 1 tablet (10 mg total) by mouth 3 (three) times daily.   clotrimazole 1 % cream Commonly known as: Clotrimazole Anti-Fungal Apply 1 application topically 2 (two) times daily.   Cyanocobalamin 2500 MCG Subl 1 tab sublingual daily What changed:  how much to take how to take this when to take this   doxycycline 100 MG tablet Commonly known as: VIBRA-TABS Take 1 tablet (100 mg total) by mouth 2 (two) times daily.   ferrous fumarate 325 (106 Fe) MG Tabs tablet Commonly known as: HEMOCYTE - 106 mg FE Take 1 tablet by mouth daily. Reported on 10/06/2015   fluticasone-salmeterol 250-50 MCG/ACT Aepb Commonly known as: [place] Inhub Inhale 1 puff into the lungs in the morning and at bedtime.   levothyroxine 100 MCG tablet Commonly known as: SYNTHROID Take 1 tablet (100 mcg total) by mouth daily before breakfast.   Magnesium 250 MG Tabs Take 250 mg by mouth daily.   ranolazine 500 MG 12 hr tablet Commonly known as: RANEXA TAKE 1 TABLET BY MOUTH TWICE A DAY   rosuvastatin 10 MG tablet Commonly known as: CRESTOR Take 1 tablet (10 mg total) by mouth daily.   traZODone 100 MG tablet Commonly known as: DESYREL Take 2 tablets (200 mg total) by mouth at bedtime.   venlafaxine XR 150 MG 24 hr capsule Commonly known as: EFFEXOR-XR Take 1 capsule (150 mg total) by mouth daily with breakfast.        All past medical history, surgical history, allergies, family history, immunizations andmedications were updated in the EMR  today and reviewed under the history and medication portions of their EMR.     ROS: Negative, with the exception of above mentioned in HPI   Objective:  BP 105/62    Pulse 78    Temp 98.2 F (36.8 C) (Oral)    Ht 5\' 4"  (1.626 m)    Wt 172 lb (78 kg)    SpO2 95%    BMI 29.52 kg/m  Body mass  index is 29.52 kg/m. Gen: Afebrile. No acute distress. Nontoxic in appearance.  HENT: AT. [place].  Eyes:Pupils Equal Round Reactive to light, Extraocular movements intact,  Conjunctiva without redness, discharge or icterus. Skin/finger/nail: right middle finger w/out erythema or drainage now. scaly dry rash around distal tip of middle finger. No purpura or petechiae.  Neuro: Normal gait. PERLA. EOMi. Alert. Oriented x3 Psych: Normal affect, dress and demeanor. Normal speech. Normal thought content and judgment..    No results found. No results found. No results found for this or any previous visit (from the past 24 hour(s)).  Assessment/Plan: AYRIANA WIX is a 75 y.o. female present for OV for  Paronychia of finger of right hand/Finger pain, right If electing to keep finger covered she was encouraged to use Coban in place at [place]. May discontinue epson salt soaks. Infection has cleared.  Continue clotrimazole BID until nail as grown out.  If symptoms worsen or other nails involved would consider oral terbinafine.   Reviewed expectations re: course of current medical issues. Discussed self-management of symptoms. Outlined signs and symptoms indicating need for more acute intervention. Patient verbalized understanding and all questions were answered. Patient received an After-Visit Summary.    No orders of the defined types were placed in this encounter.  Meds ordered this encounter  Medications   clotrimazole (CLOTRIMAZOLE ANTI-FUNGAL) 1 % cream    Sig: Apply 1 application topically 2 (two) times daily.    Dispense:  45 g    Refill:  11   Referral Orders  No referral(s) requested today     Note is dictated utilizing voice recognition software. Although note has been proof read prior to signing, occasional typographical errors still can be missed. If any questions arise, please do not hesitate to call for verification.   electronically signed by:  Howard Pouch,  DO  [place]

## 2021-04-21 ENCOUNTER — Other Ambulatory Visit (HOSPITAL_COMMUNITY): Payer: Self-pay | Admitting: Psychiatry

## 2021-04-21 ENCOUNTER — Other Ambulatory Visit (HOSPITAL_COMMUNITY): Payer: Self-pay | Admitting: *Deleted

## 2021-04-21 DIAGNOSIS — F17200 Nicotine dependence, unspecified, uncomplicated: Secondary | ICD-10-CM

## 2021-04-21 DIAGNOSIS — F411 Generalized anxiety disorder: Secondary | ICD-10-CM

## 2021-04-21 DIAGNOSIS — F331 Major depressive disorder, recurrent, moderate: Secondary | ICD-10-CM

## 2021-04-21 DIAGNOSIS — F99 Mental disorder, not otherwise specified: Secondary | ICD-10-CM

## 2021-04-21 DIAGNOSIS — F1020 Alcohol dependence, uncomplicated: Secondary | ICD-10-CM

## 2021-04-21 MED ORDER — BUSPIRONE HCL 10 MG PO TABS: 10.0000 mg | ORAL_TABLET | Freq: Three times a day (TID) | ORAL | 0 refills | Status: DC

## 2021-04-21 MED ORDER — BUPROPION HCL ER (SR) 150 MG PO TB12: 150.0000 mg | ORAL_TABLET | Freq: Two times a day (BID) | ORAL | 0 refills | Status: DC

## 2021-04-21 MED ORDER — VENLAFAXINE HCL ER 150 MG PO CP24: 150.0000 mg | ORAL_CAPSULE | Freq: Every day | ORAL | 0 refills | Status: DC

## 2021-04-21 MED ORDER — TRAZODONE HCL 100 MG PO TABS: 200.0000 mg | ORAL_TABLET | Freq: Every day | ORAL | 0 refills | Status: DC

## 2021-04-23 ENCOUNTER — Other Ambulatory Visit: Payer: Self-pay | Admitting: Family Medicine

## 2021-05-06 ENCOUNTER — Encounter (HOSPITAL_COMMUNITY): Payer: Self-pay | Admitting: Psychiatry

## 2021-05-06 ENCOUNTER — Other Ambulatory Visit: Payer: Self-pay

## 2021-05-06 ENCOUNTER — Telehealth (HOSPITAL_BASED_OUTPATIENT_CLINIC_OR_DEPARTMENT_OTHER): Payer: Medicare PPO | Admitting: Psychiatry

## 2021-05-06 DIAGNOSIS — F5105 Insomnia due to other mental disorder: Secondary | ICD-10-CM

## 2021-05-06 DIAGNOSIS — F1020 Alcohol dependence, uncomplicated: Secondary | ICD-10-CM

## 2021-05-06 DIAGNOSIS — F411 Generalized anxiety disorder: Secondary | ICD-10-CM | POA: Diagnosis not present

## 2021-05-06 DIAGNOSIS — F17200 Nicotine dependence, unspecified, uncomplicated: Secondary | ICD-10-CM

## 2021-05-06 DIAGNOSIS — F99 Mental disorder, not otherwise specified: Secondary | ICD-10-CM | POA: Diagnosis not present

## 2021-05-06 DIAGNOSIS — F331 Major depressive disorder, recurrent, moderate: Secondary | ICD-10-CM

## 2021-05-06 MED ORDER — TRAZODONE HCL 100 MG PO TABS: 200.0000 mg | ORAL_TABLET | Freq: Every day | ORAL | 0 refills | Status: DC

## 2021-05-06 MED ORDER — BUSPIRONE HCL 10 MG PO TABS: 10.0000 mg | ORAL_TABLET | Freq: Three times a day (TID) | ORAL | 0 refills | Status: DC

## 2021-05-06 MED ORDER — VENLAFAXINE HCL ER 150 MG PO CP24: 150.0000 mg | ORAL_CAPSULE | Freq: Every day | ORAL | 1 refills | Status: DC

## 2021-05-06 MED ORDER — BUPROPION HCL ER (SR) 150 MG PO TB12: 150.0000 mg | ORAL_TABLET | Freq: Two times a day (BID) | ORAL | 0 refills | Status: DC

## 2021-05-06 NOTE — Progress Notes (Signed)
Virtual Visit via Video Note  I connected with [name] on 05/06/21 at  4:30 PM EST by a video enabled telemedicine application and verified that I am speaking with the correct person using two identifiers.  Location: Patient: home Provider: office   I discussed the limitations of evaluation and management by telemedicine and the availability of in person appointments. The patient expressed understanding and agreed to proceed.  History of Present Illness: Lindyn is doing well and things are good. Her mood is good most of the time. She sometimes has some anxiety. At those times she uses coping skills. Randalyn gets down a few days a month.  When it happens she is not staying in bed all day and doing nothing. She is social and doing exercises. [place] denies anhedonia, isolation and hopelessness. Her sleep is good. She denies SI/HI. She has not drank any alcohol in many years. [place] is down to 9 cigs/day as opposed to 1.5 per day. She does not have a quit date and states that has never worked for her in the past.    Observations/Objective: Psychiatric Specialty Exam: ROS  There were no vitals taken for this visit.There is no height or weight on file to calculate BMI.  General Appearance: Casual and Neat  Eye Contact:  Good  Speech:  Clear and Coherent and Normal Rate  Volume:  Normal  Mood:  Euthymic  Affect:  Full Range  Thought Process:  Goal Directed, Linear, and Descriptions of Associations: Intact  Orientation:  Full (Time, Place, and Person)  Thought Content:  Logical  Suicidal Thoughts:  No  Homicidal Thoughts:  No  Memory:  Immediate;   Good  Judgement:  Good  Insight:  Good  Psychomotor Activity:  Normal  Concentration:  Concentration: Good  Recall:  Good  Fund of Knowledge:  Good  Language:  Good  Akathisia:  No  Handed:  Right  AIMS (if indicated):     Assets:  Communication Skills Desire for Improvement Financial Resources/Insurance Housing Leisure  Time Resilience Social Support Talents/Skills Transportation Vocational/Educational  ADL's:  Intact  Cognition:  WNL  Sleep:        Assessment and Plan:  Depression screen [facility] 2/9 05/06/2021 01/28/2021 01/19/2021 11/25/2020 11/05/2020  Decreased Interest 0 0 0 0 0  Down, Depressed, Hopeless 1 0 0 0 0  PHQ - 2 Score 1 0 0 0 0  Altered sleeping - 0 0 - -  Tired, decreased energy - 0 2 - -  Change in appetite - 2 0 - -  Feeling bad or failure about yourself  - 0 0 - -  Trouble concentrating - 0 0 - -  Moving slowly or fidgety/restless - 0 0 - -  Suicidal thoughts - 0 0 - -  PHQ-9 Score - 2 2 - -  Difficult doing work/chores - - - - -  Some recent data might be hidden    Flowsheet Row Video Visit from 05/06/2021 in [facility] Video Visit from 01/28/2021 in [facility] Video Visit from 11/05/2020 in [place] No Risk No Risk No Risk       1. Nicotine use disorder - buPROPion (WELLBUTRIN SR) 150 MG 12 hr tablet; Take 1 tablet (150 mg total) by mouth 2 (two) times daily.  Dispense: 180 tablet; Refill: 0  2. Alcohol use disorder, moderate, dependence (HCC) - busPIRone (BUSPAR) 10 MG tablet; Take 1 tablet (10  mg total) by mouth 3 (three) times daily.  Dispense: 270 tablet; Refill: 0 - traZODone (DESYREL) 100 MG tablet; Take 2 tablets (200 mg total) by mouth at bedtime.  Dispense: 180 tablet; Refill: 0  3. MDD (major depressive disorder), recurrent episode, moderate (HCC) - buPROPion (WELLBUTRIN SR) 150 MG 12 hr tablet; Take 1 tablet (150 mg total) by mouth 2 (two) times daily.  Dispense: 180 tablet; Refill: 0 - busPIRone (BUSPAR) 10 MG tablet; Take 1 tablet (10 mg total) by mouth 3 (three) times daily.  Dispense: 270 tablet; Refill: 0 - traZODone (DESYREL) 100 MG tablet; Take 2 tablets (200 mg total) by mouth at bedtime.  Dispense: 180 tablet;  Refill: 0 - venlafaxine XR (EFFEXOR-XR) 150 MG 24 hr capsule; Take 1 capsule (150 mg total) by mouth daily with breakfast.  Dispense: 90 capsule; Refill: 1  4. GAD (generalized anxiety disorder) - busPIRone (BUSPAR) 10 MG tablet; Take 1 tablet (10 mg total) by mouth 3 (three) times daily.  Dispense: 270 tablet; Refill: 0 - venlafaxine XR (EFFEXOR-XR) 150 MG 24 hr capsule; Take 1 capsule (150 mg total) by mouth daily with breakfast.  Dispense: 90 capsule; Refill: 1  5. Insomnia due to other mental disorder - traZODone (DESYREL) 100 MG tablet; Take 2 tablets (200 mg total) by mouth at bedtime.  Dispense: 180 tablet; Refill: 0   - we discussed smoking cessation in detail  Follow Up Instructions: In 5-6 months or sooner if needed   I discussed the assessment and treatment plan with the patient. The patient was provided an opportunity to ask questions and all were answered. The patient agreed with the plan and demonstrated an understanding of the instructions.   The patient was advised to call back or seek an in-person evaluation if the symptoms worsen or if the condition fails to improve as anticipated.  I provided 13 minutes of non-face-to-face time during this encounter.   [name], MD

## 2021-05-13 ENCOUNTER — Other Ambulatory Visit: Payer: Self-pay | Admitting: Family Medicine

## 2021-05-13 ENCOUNTER — Other Ambulatory Visit: Payer: Self-pay | Admitting: Psychiatry

## 2021-05-13 DIAGNOSIS — J449 Chronic obstructive pulmonary disease, unspecified: Secondary | ICD-10-CM

## 2021-07-12 ENCOUNTER — Encounter: Payer: Self-pay | Admitting: Cardiology

## 2021-07-12 ENCOUNTER — Other Ambulatory Visit: Payer: Self-pay

## 2021-07-12 ENCOUNTER — Ambulatory Visit (INDEPENDENT_AMBULATORY_CARE_PROVIDER_SITE_OTHER): Payer: Medicare PPO | Admitting: Cardiology

## 2021-07-12 VITALS — BP 124/76 | HR 70 | Ht 63.5 in | Wt 171.0 lb

## 2021-07-12 DIAGNOSIS — R9439 Abnormal result of other cardiovascular function study: Secondary | ICD-10-CM

## 2021-07-12 DIAGNOSIS — R079 Chest pain, unspecified: Secondary | ICD-10-CM | POA: Diagnosis not present

## 2021-07-12 DIAGNOSIS — E785 Hyperlipidemia, unspecified: Secondary | ICD-10-CM | POA: Diagnosis not present

## 2021-07-12 DIAGNOSIS — I739 Peripheral vascular disease, unspecified: Secondary | ICD-10-CM | POA: Diagnosis not present

## 2021-07-12 DIAGNOSIS — I2510 Atherosclerotic heart disease of native coronary artery without angina pectoris: Secondary | ICD-10-CM

## 2021-07-12 DIAGNOSIS — I10 Essential (primary) hypertension: Secondary | ICD-10-CM | POA: Diagnosis not present

## 2021-07-12 MED ORDER — METOPROLOL TARTRATE 100 MG PO TABS: 100.0000 mg | ORAL_TABLET | Freq: Once | ORAL | 0 refills | Status: DC

## 2021-07-12 NOTE — Progress Notes (Signed)
Cardiology Office Note:    Date:  07/12/2021   ID:  Kendra Ball, DOB 05-25-1945, MRN [id number]  PCP:  Natalia Leatherwood, DO  Cardiologist:  Gypsy Balsam, MD    Referring MD: Natalia Leatherwood, DO   No chief complaint on file. I have episode of profound weakness  History of Present Illness:    Kendra Ball is a 76 y.o. female with past medical history significant for coronary artery disease.  Initially she was sent to [place] because of CT of her chest shows some calcification of the coronary arteries.  After that she did have a stress test which showed possibility of apical MI with some peri-infarct ischemia then echocardiogram has been performed which did not show any evidence of anterior wall MI however showed possibility of inferior wall MI overall is a very confusing story with conflicting information was getting from different test.  She is a chronic smoker she does have advanced peripheral vascular disease with up to 80% stenosis on the right side of her neck with 60 on the left.  She also started having financial hypertension, she continues to smoke, there is no history of TIA/CVA. She is coming today to my office for follow-up.  She describes episode of profound weakness and fatigue.  She said she could be sitting and then get horribly weak and tired symptoms happened during exercise.  However she is trying to be able be more active.  She is also working on quitting smoking.  Past Medical History:  Diagnosis Date   Abnormal mammogram 10/01/2013   Diagnostic [facility] & [place] confirm cyst & duct ectasia of L breast. 08/2013.    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 RF: smoker, fam Hx, sedentary HDL is high!!    Dyspnea on exertion 05/08/2020   Essential hypertension 05/22/2014   Family history of malignant neoplasm of gastrointestinal tract 05/05/2020   Family history of malignant neoplasm of gastrointestinal tract 05/05/2020   Family history of premature coronary heart disease 08/13/2013   Father, brother, sister Personal risk factors: smoker, female over 27, fam Hx, sedentary, thyroid disease, dyslipidemia, BMI over 25.    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     Insomnia 05/05/2015   Iron deficiency anemia 05/05/2020   Lymphoma (HCC) 2019   B-cell   MDD (major depressive disorder), recurrent episode, moderate (HCC) 01/30/2014   [facility] 05/2015 08/18/2015:  Effexor XR 225mg  po qD for depression and anxiety Wellbutrin SR to 200mg  po BID for depression   Xanax  0.5mg  po BID and 1mg  po qHS prn anxiety       Routine PRN Medications:  Yes    Consultations: encouraged to f/up with PCP as needed, encouraged to start individual therapy   Safety Concerns:  Pt denies SI and is at an acute low risk for suicide.Patient told to call clinic i   Multiple fractures 01/29/2019   Obesity    Osteoporosis    Paresthesia 08/06/2019   Port-A-Cath in place 12/27/2017   Pulmonary nodule seen on imaging study 02/13/2015   Smoker 09/14/2015   Substance abuse (HCC)    inpatient and outpatient tx for  substance abuse   TOBACCO USER 12/25/2009   Tremor 05/23/2019    Past Surgical History:  Procedure Laterality Date   AXILLARY LYMPH NODE BIOPSY Right 06/22/2017   Procedure: RIGHT AXILLARY LYMPH NODE BIOPSY;  Surgeon: Manus Rudd, MD;  Location: [facility];  Service: General;  Laterality: Right;   BARIATRIC SURGERY  2006   CESAREAN SECTION     COLON RESECTION N/A 05/09/2014   Procedure: LAPAROSCOPIC HAND-ASSISTED EXTENDED RIGHT COLECTOMY, LAPAROSCOPIC LYSIS OF ADHESIONS, SPLENIC FLEXURE MOBILIZATION.;  Surgeon: Karie Soda, MD;  Location: [facility];  Service: General;  Laterality: N/A;   DILATION AND CURETTAGE OF UTERUS     x2   FOREARM FRACTURE SURGERY     right   GASTRIC BYPASS  2006   IR IMAGING GUIDED PORT INSERTION  12/01/2017   IR REMOVAL TUN ACCESS W/ PORT W/O FL MOD SED  07/03/2018   MASS EXCISION Right 02/24/2017   Procedure: EXCISIONAL BIOPSY RIGHT SUPRA-CLAVICULAR NODE;  Surgeon: Flo Shanks, MD;  Location: [facility];  Service: ENT;  Laterality: Right;   TONSILLECTOMY      Current Medications: Current Meds  Medication Sig   albuterol (VENTOLIN HFA) 108 (90 Base) MCG/ACT inhaler Inhale 2 puffs into the lungs every 6 (six) hours as needed for wheezing or shortness of breath.   alendronate (FOSAMAX) 70 MG tablet Take 1 tablet (70 mg total) by mouth every 7 (seven) days. Take with a full glass of water on an empty stomach.   aspirin EC 81 MG tablet Take 1 tablet (81 mg total) by mouth daily. Swallow whole.   buPROPion (WELLBUTRIN SR) 150 MG 12 hr tablet Take 1 tablet (150 mg total) by mouth 2 (two) times daily.   busPIRone (BUSPAR) 10 MG tablet Take 1 tablet (10 mg total) by mouth 3 (three) times daily.   clotrimazole (CLOTRIMAZOLE ANTI-FUNGAL) 1 % cream Apply 1 application topically 2 (two) times daily.   Cyanocobalamin 2500 MCG SUBL 1 tab sublingual daily (Patient taking differently: Take 1 tablet by mouth daily. 1 tab sublingual daily)   ferrous fumarate (HEMOCYTE  - 106 MG FE) 325 (106 FE) MG TABS Take 1 tablet by mouth daily. Reported on 10/06/2015   fluticasone-salmeterol (WIXELA INHUB) 250-50 MCG/ACT AEPB Inhale 1 puff into the lungs in the morning and at bedtime.   levothyroxine (SYNTHROID) 100 MCG tablet Take 1 tablet (100 mcg total) by mouth daily before breakfast.   Magnesium 250 MG TABS Take 250 mg by mouth daily.    ranolazine (RANEXA) 500 MG 12 hr tablet TAKE 1 TABLET BY MOUTH TWICE A DAY (Patient taking differently: Take 500 mg by mouth 2 (two) times daily.)   rosuvastatin (CRESTOR) 10 MG tablet Take 1 tablet (10 mg total) by mouth daily.   traZODone (DESYREL) 100 MG tablet Take 2 tablets (200 mg total) by mouth at bedtime.  venlafaxine XR (EFFEXOR-XR) 150 MG 24 hr capsule Take 1 capsule (150 mg total) by mouth daily with breakfast.     Allergies:   Morphine sulfate   Social History   Socioeconomic History   Marital status: Married    Spouse name: Not on file   Number of children: 3   Years of education: Not on file   Highest education level: Not on file  Occupational History   Occupation: RETIRED    Employer: [employer]  Tobacco Use   Smoking status: Every Day    Packs/day: 0.50    Years: 50.00    Pack years: 25.00    Types: Cigarettes   Smokeless tobacco: Never   Tobacco comments:    9 cigs/day on 05/06/2021  Vaping Use   Vaping Use: Never used  Substance and Sexual Activity   Alcohol use: No    Comment: quit on Aug 27, 2015   Drug use: No    Frequency: 20.0 times per week    Comment: last used May 2017   Sexual activity: Not Currently    Partners: Male    Birth control/protection: Post-menopausal  Other Topics Concern   Not on file  Social History Narrative   Ms. Donofrio lives in [place] with her husband. She has 3 grown children and several grand children. She has joint custody of 3 of her grand children as her daughter has bipolar disorder and has needed assistance with children in past.   1-2 cups  coffee, 3-6 cups tea per day.   Right-handed.   Social Determinants of Health   Financial Resource Strain: Low Risk    Difficulty of Paying Living Expenses: Not hard at all  Food Insecurity: No Food Insecurity   Worried About Programme researcher, broadcasting/film/video in the Last Year: Never true   Ran Out of Food in the Last Year: Never true  Transportation Needs: No Transportation Needs   Lack of Transportation (Medical): No   Lack of Transportation (Non-Medical): No  Physical Activity: Inactive   Days of Exercise per Week: 0 days   Minutes of Exercise per Session: 0 min  Stress: No Stress Concern Present   Feeling of Stress : Not at all  Social Connections: Moderately Isolated   Frequency of Communication with Friends and Family: More than three times a week   Frequency of Social Gatherings with Friends and Family: Twice a week   Attends Religious Services: Never   Database administrator or Organizations: No   Attends Engineer, structural: Never   Marital Status: Married     Family History: The patient's family history includes Alcohol abuse in her maternal uncle; Alzheimer's disease in her mother; Bipolar disorder in her daughter; Cancer in her mother; Colon cancer in her maternal aunt; Dementia in her paternal aunt; Depression in her brother, mother, sister, and sister; Heart disease (age of onset: 6) in her father; Heart disease (age of onset: 33) in her sister; Heart disease (age of onset: 69) in her brother; Mental illness in her daughter; Obesity in her daughter, daughter, and son; Stomach cancer in her maternal grandfather; Suicidality in her daughter. ROS:   Please see the history of present illness.    All 14 point review of systems negative except as described per history of present illness  EKGs/Labs/Other Studies Reviewed:      Recent Labs: 01/19/2021: ALT 19; BUN 9; Creatinine, Ser 0.73; Hemoglobin 14.2; Platelets 213.0; Potassium 4.3; Sodium 141; TSH 0.57  Recent  Lipid  Panel    Component Value Date/Time   CHOL 168 03/24/2020 1014   TRIG 86.0 03/24/2020 1014   HDL 89.90 03/24/2020 1014   CHOLHDL 2 03/24/2020 1014   VLDL 17.2 03/24/2020 1014   LDLCALC 61 03/24/2020 1014   LDLDIRECT 46 01/05/2021 1010   LDLDIRECT 133.5 08/09/2012 1036    Physical Exam:    VS:  BP 124/76 (BP Location: Left Arm)   Pulse 70   Ht 5' 3.5" (1.613 m)   Wt 171 lb (77.6 kg)   SpO2 96%   BMI 29.82 kg/m     Wt Readings from Last 3 Encounters:  07/12/21 171 lb (77.6 kg)  04/12/21 172 lb (78 kg)  03/10/21 172 lb (78 kg)     GEN:  Well nourished, well developed in no acute distress HEENT: Normal NECK: No JVD; No carotid bruits LYMPHATICS: No lymphadenopathy CARDIAC: RRR, no murmurs, no rubs, no gallops RESPIRATORY:  Clear to auscultation without rales, wheezing or rhonchi  ABDOMEN: Soft, non-tender, non-distended MUSCULOSKELETAL:  No edema; No deformity  SKIN: Warm and dry LOWER EXTREMITIES: no swelling NEUROLOGIC:  Alert and oriented x 3 PSYCHIATRIC:  Normal affect   ASSESSMENT:    1. Peripheral vascular disease, unspecified (HCC) bilateral carotic arterial disease   2. Essential hypertension   3. Atherosclerosis of native coronary artery of native heart without angina pectoris   4. Dyslipidemia   5. Abnormal stress test    PLAN:    In order of problems listed above:  Peripheral vascular disease.  I will ask her to have another carotic ultrasounds performed. Coronary artery disease with conflicting information was getting out of stress testing out of echocardiogram.  I will ask her to have coronary CT angio to look and see if she gets significant coronary artery disease meaning left main triple-vessel.  Overall clinically she seems doing well but does have 2 strange episodes that are difficult to explain with profound weakness and fatigue.  It could be anginal equivalent.  She is already on antiplatelet therapy as well as statin and antianginal  therapy. Dyslipidemia I did review her K PN which show me her LDL of 46 HDL 89 she is on Crestor 10 which I will continue. Essential hypertension: Blood pressure well controlled.   Medication Adjustments/Labs and Tests Ordered: Current medicines are reviewed at length with the patient today.  Concerns regarding medicines are outlined above.  No orders of the defined types were placed in this encounter.  Medication changes: No orders of the defined types were placed in this encounter.   Signed, Georgeanna Lea, MD, [facility] 07/12/2021 11:08 AM    [facility]

## 2021-07-12 NOTE — Patient Instructions (Signed)
Medication Instructions:  ?Your physician recommends that you continue on your current medications as directed. Please refer to the Current Medication list given to you today. ? ?*If you need a refill on your cardiac medications before your next appointment, please call your pharmacy* ? ? ?Lab Work: ? ?BMP 1 week before CT ? ?If you have labs (blood work) drawn today and your tests are completely normal, you will receive your results only by: ?MyChart Message (if you have MyChart) OR ?A paper copy in the mail ?If you have any lab test that is abnormal or we need to change your treatment, we will call you to review the results. ? ? ?Testing/Procedures: ?Your physician has requested that you have a carotid duplex. This test is an ultrasound of the carotid arteries in your neck. It looks at blood flow through these arteries that supply the brain with blood. Allow one hour for this exam. There are no restrictions or special instructions.  ? ? ? ?Your cardiac CT will be scheduled at one of the below locations:  ? ?[facility] ?[address] ?[place], [place] [address] ?[phone] ? ?OR ? ?[place] [facility] ?[address] ?[place], [place] [address] ?[phone] ? ?If scheduled at [facility], please arrive at the [facility]'s Entrance (Entrance C2) of [facility] 30 minutes prior to test start time. ?You can use the FREE valet parking offered at entrance C (encouraged to control the heart rate for the test)  ?Proceed to the [facility] Radiology Department (first floor) to check-in and test prep. ? ?All radiology patients and guests should use entrance C2 at [facility], accessed from [facility], even though the hospital's physical address listed is [address] ? ? ? ?If scheduled at [facility], please arrive 15 mins early for check-in and test prep. ? ?Please follow these  instructions carefully (unless otherwise directed): ? ?On the Night Before the Test: ?Be sure to Drink plenty of water. ?Do not consume any caffeinated/decaffeinated beverages or chocolate 12 hours prior to your test. ?Do not take any antihistamines 12 hours prior to your test. ? ?On the Day of the Test: ?Drink plenty of water until 1 hour prior to the test. ?Do not eat any food 4 hours prior to the test. ?You may take your regular medications prior to the test.  ?Take metoprolol (Lopressor) two hours prior to test. ?FEMALES- please wear underwire-free bra if available, avoid dresses & tight clothing ?     ?After the Test: ?Drink plenty of water. ?After receiving IV contrast, you may experience a mild flushed feeling. This is normal. ?On occasion, you may experience a mild rash up to 24 hours after the test. This is not dangerous. If this occurs, you can take Benadryl 25 mg and increase your fluid intake. ?If you experience trouble breathing, this can be serious. If it is severe call 911 IMMEDIATELY. If it is mild, please call our office. ?If you take any of these medications: Glipizide/Metformin, Avandament, Glucavance, please do not take 48 hours after completing test unless otherwise instructed. ? ?We will call to schedule your test 2-4 weeks out understanding that some insurance companies will need an authorization prior to the service being performed.  ? ?For non-scheduling related questions, please contact the cardiac imaging nurse navigator should you have any questions/concerns: ?Marchia Bond, Cardiac Imaging Nurse Navigator ?Gordy Clement, Cardiac Imaging Nurse Navigator ?[facility] ?Direct Office Dial: [phone]  ? ?For scheduling needs, including cancellations and rescheduling, please call [place], [phone]. ? ? ? ?Follow-Up: ?At [facility], you and your health needs are our priority.  As part of our continuing mission to provide you with exceptional heart care, we  have created designated Provider Care Teams.  These Care Teams include your primary Cardiologist (physician) and Advanced Practice Providers (APPs -  Physician Assistants and Nurse Practitioners) who all work together to provide you with the care you need, when you need it. ? ?We recommend signing up for the patient portal called "MyChart".  Sign up information is provided on this After Visit Summary.  MyChart is used to connect with patients for Virtual Visits (Telemedicine).  Patients are able to view lab/test results, encounter notes, upcoming appointments, etc.  Non-urgent messages can be sent to your provider as well.   ?To learn more about what you can do with MyChart, go to [email or web address].   ? ?Your next appointment:   ?4 month(s) ? ?The format for your next appointment:   ?In Person ? ?Provider:   ?Jenne Campus, MD  ? ? ?Other Instructions ?None ? ?

## 2021-07-21 ENCOUNTER — Other Ambulatory Visit: Payer: Self-pay | Admitting: Family Medicine

## 2021-07-22 ENCOUNTER — Ambulatory Visit (HOSPITAL_COMMUNITY)
Admission: RE | Admit: 2021-07-22 | Discharge: 2021-07-22 | Disposition: A | Discharge status: Home / Self Care | Payer: Medicare PPO | Source: Ambulatory Visit | Attending: Cardiology | Admitting: Cardiology

## 2021-07-22 ENCOUNTER — Other Ambulatory Visit: Payer: Medicare PPO

## 2021-07-22 DIAGNOSIS — I10 Essential (primary) hypertension: Secondary | ICD-10-CM

## 2021-07-22 DIAGNOSIS — E785 Hyperlipidemia, unspecified: Secondary | ICD-10-CM | POA: Diagnosis not present

## 2021-07-22 DIAGNOSIS — I2510 Atherosclerotic heart disease of native coronary artery without angina pectoris: Secondary | ICD-10-CM | POA: Diagnosis not present

## 2021-07-22 DIAGNOSIS — I739 Peripheral vascular disease, unspecified: Secondary | ICD-10-CM

## 2021-07-22 DIAGNOSIS — R9439 Abnormal result of other cardiovascular function study: Secondary | ICD-10-CM | POA: Diagnosis not present

## 2021-07-22 DIAGNOSIS — R079 Chest pain, unspecified: Secondary | ICD-10-CM | POA: Diagnosis not present

## 2021-07-22 LAB — BASIC METABOLIC PANEL
BUN/Creatinine Ratio: 13 (ref 12–28)
BUN: 10 mg/dL (ref 8–27)
CO2: 26 mmol/L (ref 20–29)
Calcium: 8.9 mg/dL (ref 8.7–10.3)
Chloride: 103 mmol/L (ref 96–106)
Creatinine, Ser: 0.79 mg/dL (ref 0.57–1.00)
Glucose: 94 mg/dL (ref 70–99)
Potassium: 4.3 mmol/L (ref 3.5–5.2)
Sodium: 141 mmol/L (ref 134–144)
eGFR: 78 mL/min/{1.73_m2} (ref >59)

## 2021-07-22 NOTE — Progress Notes (Signed)
Carotid duplex has been completed.  ? ?Preliminary results in CV Proc.  ? ?Kendra Ball ?07/22/2021 9:57 AM    ?

## 2021-07-23 ENCOUNTER — Encounter: Payer: Self-pay | Admitting: Family Medicine

## 2021-07-26 ENCOUNTER — Telehealth (HOSPITAL_COMMUNITY): Payer: Self-pay | Admitting: *Deleted

## 2021-07-26 NOTE — Telephone Encounter (Signed)
Reaching out to patient to offer assistance regarding upcoming cardiac imaging study; pt verbalizes understanding of appt date/time, parking situation and where to check in, pre-test NPO status and medications ordered, and verified current allergies; name and call back number provided for further questions should they arise ? ?Kendra Goll RN Navigator Cardiac Imaging ? Heart and Vascular ?[phone] office ?[phone] cell ? ?Patient to take 100mg metoprolol tartrate two hours prior to her cardiac CT scan.  She is aware to arrive at 1:30pm. ? ?

## 2021-07-27 ENCOUNTER — Ambulatory Visit (HOSPITAL_COMMUNITY)
Admission: RE | Admit: 2021-07-27 | Discharge: 2021-07-27 | Disposition: A | Discharge status: Home / Self Care | Payer: Medicare PPO | Source: Ambulatory Visit | Attending: Cardiology | Admitting: Cardiology

## 2021-07-27 DIAGNOSIS — R079 Chest pain, unspecified: Secondary | ICD-10-CM | POA: Insufficient documentation

## 2021-07-27 DIAGNOSIS — I2510 Atherosclerotic heart disease of native coronary artery without angina pectoris: Secondary | ICD-10-CM | POA: Diagnosis not present

## 2021-07-27 MED ORDER — IOHEXOL 350 MG/ML SOLN
95.0000 mL | Freq: Once | INTRAVENOUS | Status: AC | PRN
  Administered 2021-07-27: 95 mL via INTRAVENOUS

## 2021-07-27 MED ORDER — NITROGLYCERIN 0.4 MG SL SUBL
SUBLINGUAL_TABLET | SUBLINGUAL | Status: AC
  Filled 2021-07-27: qty 2

## 2021-07-27 MED ORDER — NITROGLYCERIN 0.4 MG SL SUBL
0.8000 mg | SUBLINGUAL_TABLET | Freq: Once | SUBLINGUAL | Status: AC
  Administered 2021-07-27: 0.8 mg via SUBLINGUAL

## 2021-08-02 ENCOUNTER — Other Ambulatory Visit: Payer: Self-pay | Admitting: Cardiology

## 2021-08-02 ENCOUNTER — Other Ambulatory Visit (HOSPITAL_COMMUNITY): Payer: Self-pay | Admitting: Psychiatry

## 2021-08-02 DIAGNOSIS — F5105 Insomnia due to other mental disorder: Secondary | ICD-10-CM

## 2021-08-02 DIAGNOSIS — F1020 Alcohol dependence, uncomplicated: Secondary | ICD-10-CM

## 2021-08-02 DIAGNOSIS — J449 Chronic obstructive pulmonary disease, unspecified: Secondary | ICD-10-CM

## 2021-08-02 DIAGNOSIS — F331 Major depressive disorder, recurrent, moderate: Secondary | ICD-10-CM

## 2021-08-03 ENCOUNTER — Telehealth (HOSPITAL_COMMUNITY): Payer: Self-pay

## 2021-08-03 NOTE — Telephone Encounter (Signed)
error 

## 2021-08-03 NOTE — Telephone Encounter (Signed)
Patient called requesting a refill on her Trazodone '100mg'$  to be sent to [facility] on [address] in [place]. Pt stated that she's out as of today. Pt has followup appointment scheduled for 10/21/21. Please review and advise. Thank you ?

## 2021-08-04 ENCOUNTER — Other Ambulatory Visit (HOSPITAL_COMMUNITY): Payer: Self-pay | Admitting: *Deleted

## 2021-08-04 DIAGNOSIS — F5105 Insomnia due to other mental disorder: Secondary | ICD-10-CM

## 2021-08-04 DIAGNOSIS — F331 Major depressive disorder, recurrent, moderate: Secondary | ICD-10-CM

## 2021-08-04 DIAGNOSIS — F1020 Alcohol dependence, uncomplicated: Secondary | ICD-10-CM

## 2021-08-04 MED ORDER — TRAZODONE HCL 100 MG PO TABS: 200.0000 mg | ORAL_TABLET | Freq: Every day | ORAL | 0 refills | Status: DC

## 2021-08-05 ENCOUNTER — Other Ambulatory Visit (HOSPITAL_COMMUNITY): Payer: Self-pay | Admitting: Psychiatry

## 2021-08-05 DIAGNOSIS — F1020 Alcohol dependence, uncomplicated: Secondary | ICD-10-CM

## 2021-08-05 DIAGNOSIS — F5105 Insomnia due to other mental disorder: Secondary | ICD-10-CM

## 2021-08-05 DIAGNOSIS — F331 Major depressive disorder, recurrent, moderate: Secondary | ICD-10-CM

## 2021-09-07 ENCOUNTER — Encounter: Payer: Self-pay | Admitting: Nurse Practitioner

## 2021-09-07 ENCOUNTER — Inpatient Hospital Stay: Payer: Medicare PPO | Attending: Nurse Practitioner | Admitting: Nurse Practitioner

## 2021-09-07 VITALS — BP 124/65 | HR 74 | Temp 98.2°F | Resp 18 | Ht 63.5 in | Wt 173.0 lb

## 2021-09-07 DIAGNOSIS — Z79899 Other long term (current) drug therapy: Secondary | ICD-10-CM | POA: Insufficient documentation

## 2021-09-07 DIAGNOSIS — Z800 Family history of malignant neoplasm of digestive organs: Secondary | ICD-10-CM | POA: Diagnosis not present

## 2021-09-07 DIAGNOSIS — C8338 Diffuse large B-cell lymphoma, lymph nodes of multiple sites: Secondary | ICD-10-CM | POA: Diagnosis not present

## 2021-09-07 DIAGNOSIS — C8330 Diffuse large B-cell lymphoma, unspecified site: Secondary | ICD-10-CM | POA: Diagnosis not present

## 2021-09-07 DIAGNOSIS — I10 Essential (primary) hypertension: Secondary | ICD-10-CM | POA: Diagnosis not present

## 2021-09-07 DIAGNOSIS — Z808 Family history of malignant neoplasm of other organs or systems: Secondary | ICD-10-CM | POA: Diagnosis not present

## 2021-09-07 DIAGNOSIS — E039 Hypothyroidism, unspecified: Secondary | ICD-10-CM | POA: Insufficient documentation

## 2021-09-07 DIAGNOSIS — E7800 Pure hypercholesterolemia, unspecified: Secondary | ICD-10-CM | POA: Insufficient documentation

## 2021-09-07 NOTE — Progress Notes (Signed)
?[facility] ?OFFICE PROGRESS NOTE ? ? ?Diagnosis: Non-Hodgkin's lymphoma ? ?INTERVAL HISTORY:  ? ?Ms. Eastlick returns as scheduled.  She feels well.  No fevers.  She has an occasional sweat.  No pain.  Good appetite.  No weight loss. ? ?Objective: ? ?Vital signs in last 24 hours: ? ?Blood pressure 124/65, pulse 74, temperature 98.2 ?F (36.8 ?C), temperature source Oral, resp. rate 18, height 5' 3.5" (1.613 m), weight 173 lb (78.5 kg), SpO2 96 %. ?  ? ?HEENT: No thrush or ulcers. ?Lymphatics: No palpable cervical, supraclavicular, axillary or inguinal lymph nodes. ?Resp: Lungs clear bilaterally. ?Cardio: Regular rate and rhythm. ?GI: Abdomen soft and nontender.  No hepatosplenomegaly. ?Vascular: No leg edema. ? ? ?Lab Results: ? ?Lab Results  ?Component Value Date  ? WBC 6.7 01/19/2021  ? HGB 14.2 01/19/2021  ? HCT 43.5 01/19/2021  ? MCV 101.7 (H) 01/19/2021  ? PLT 213.0 01/19/2021  ? NEUTROABS 4.7 01/19/2021  ? ? ?Imaging: ? ?No results found. ? ?Medications: I have reviewed the patient's current medications. ? ?Assessment/Plan: ?Non-Hodgkin's lymphoma, diffuse large B-cell lymphoma, CD20 positive ?Ultrasound of the neck 02/18/2017- right supraclavicular adenopathy with at least 3 nodes, largest measuring 1.3 x 1.1 x 1.4 cm; 2 adjacent hypoechoic right occipital lymph nodes ?Excisional biopsy right supraclavicular lymphadenopathy 16/12/6787 (Dr. Deetta Perla lymphoid proliferation.  Necrotic soft tissue with associated acute and chronic inflammation.  By flow cytometry no monoclonal B-cell or phenotypically aberrant T-cell population.  Special stains for microorganisms including AFB, GMS, PAS and Warthin-Starry, are negative. ?CT chest 05/30/2017- multiple enlarged right axillary lymph nodes.  Largest right axillary lymph node measures 2.6 x 2.2 cm.  Single enlarged left axillary lymph node measuring 16 mm. ?CT neck 06/03/2017- known bilateral axillary lymphadenopathy partially covered on the scan.   No lymphadenopathy seen in the neck. ?Biopsy right axillary lymph node 06/22/2017 (Dr. Georgette Dover)- fibroadipose tissue with necrosis, chronic inflammation and granulation tissue ?PET scan 3/81/0175- hypermetabolic bilateral axillary lymph nodes.  Hypermetabolic but small right supraclavicular lymph nodes.  In the right omentum there is a small focus of rim density and internal fat density which is hypermetabolic. ?CT neck 10/25/2017- progressed bilateral subclavian lymphadenopathy since February, right greater than left.  Conspicuous enlargement of the small lymph nodes at the right level 2 and 3 nodal stations. ?CT chest/abdomen/pelvis 10/25/2017- progressive adenopathy identified within the left axilla, bilateral supraclavicular stations and left inguinal region.  Persistent but improved right axillary adenopathy.  Scattered indeterminate low-attenuation foci within the spleen not seen on study from 05/30/2017. ?Excisional biopsy of right supraclavicular and posterior triangle lymph nodes on 11/16/2017-diffuse large B-cell lymphoma, CD20 positive ?PET scan 11/28/2017- new hypermetabolic lymph nodes and progressive hypermetabolism in existing lymph nodes.  Hypermetabolic focus along the anterior aspect of the right scapula with adjacent soft tissue mass.  No new adenopathy involving the mediastinal or hilar regions of the chest or the abdomen or pelvis.  No obvious involvement of the liver or spleen.  ?IPI score 3 ?Cycle 1 CHOP/Rituxan 12/04/2017 ?Cycle 2 CHOP/Rituxan 12/27/2017 ?Cycle 3 CHOP/Rituxan 01/16/2018  ?Cycle 4 CHOP/Rituxan 02/05/2018 ?PET 02/19/2018- resolution of previously noted hypermetabolic adenopathy, no residual hypermetabolic osseous lesions ?Cycle 5 CHOP/rituximab 02/27/2018 ?Cycle 6 CHOP/rituximab 03/20/2018 ?Hypertension  ?Hypothyroid ?Hypercholesterolemia ?Family history significant for colon cancer, stomach cancer and brain cancer ?Ongoing tobacco use ?Port-A-Cath placement 12/01/2017, Interventional  Radiology ?Left arm edema-negative venous Doppler 02/26/2018 ?  ?  ? ?Disposition: Ms. Romanek remains in clinical remission from non-Hodgkin's lymphoma.  She will  return for a follow-up appointment in 6 months.  We are available to see her sooner if needed. ? ? ? ?[name] ANP/GNP-BC  ? ?09/07/2021  ?10:56 AM ? ? ? ? ? ? ? ?

## 2021-09-09 ENCOUNTER — Other Ambulatory Visit (HOSPITAL_COMMUNITY): Payer: Self-pay | Admitting: Psychiatry

## 2021-09-09 DIAGNOSIS — F331 Major depressive disorder, recurrent, moderate: Secondary | ICD-10-CM

## 2021-09-09 DIAGNOSIS — F99 Mental disorder, not otherwise specified: Secondary | ICD-10-CM

## 2021-09-09 DIAGNOSIS — F1020 Alcohol dependence, uncomplicated: Secondary | ICD-10-CM

## 2021-09-14 ENCOUNTER — Other Ambulatory Visit (HOSPITAL_COMMUNITY): Payer: Self-pay | Admitting: *Deleted

## 2021-09-14 DIAGNOSIS — F331 Major depressive disorder, recurrent, moderate: Secondary | ICD-10-CM

## 2021-09-14 DIAGNOSIS — F1020 Alcohol dependence, uncomplicated: Secondary | ICD-10-CM

## 2021-09-14 DIAGNOSIS — F5105 Insomnia due to other mental disorder: Secondary | ICD-10-CM

## 2021-09-14 MED ORDER — TRAZODONE HCL 100 MG PO TABS: 200.0000 mg | ORAL_TABLET | Freq: Every day | ORAL | 1 refills | Status: DC

## 2021-09-17 ENCOUNTER — Ambulatory Visit (INDEPENDENT_AMBULATORY_CARE_PROVIDER_SITE_OTHER): Payer: Medicare PPO | Admitting: Family Medicine

## 2021-09-17 ENCOUNTER — Encounter: Payer: Self-pay | Admitting: Family Medicine

## 2021-09-17 VITALS — BP 97/62 | HR 63 | Temp 98.9°F | Ht 63.5 in | Wt 171.0 lb

## 2021-09-17 DIAGNOSIS — I700 Atherosclerosis of aorta: Secondary | ICD-10-CM

## 2021-09-17 DIAGNOSIS — J449 Chronic obstructive pulmonary disease, unspecified: Secondary | ICD-10-CM | POA: Diagnosis not present

## 2021-09-17 DIAGNOSIS — C8330 Diffuse large B-cell lymphoma, unspecified site: Secondary | ICD-10-CM

## 2021-09-17 DIAGNOSIS — M810 Age-related osteoporosis without current pathological fracture: Secondary | ICD-10-CM | POA: Diagnosis not present

## 2021-09-17 DIAGNOSIS — Z8572 Personal history of non-Hodgkin lymphomas: Secondary | ICD-10-CM

## 2021-09-17 DIAGNOSIS — I739 Peripheral vascular disease, unspecified: Secondary | ICD-10-CM

## 2021-09-17 DIAGNOSIS — E034 Atrophy of thyroid (acquired): Secondary | ICD-10-CM

## 2021-09-17 DIAGNOSIS — F331 Major depressive disorder, recurrent, moderate: Secondary | ICD-10-CM

## 2021-09-17 LAB — CBC WITH DIFFERENTIAL/PLATELET
Basophils Absolute: 0 10*3/uL (ref 0.0–0.1)
Basophils Relative: 0.7 % (ref 0.0–3.0)
Eosinophils Absolute: 0.1 10*3/uL (ref 0.0–0.7)
Eosinophils Relative: 1.3 % (ref 0.0–5.0)
HCT: 43.8 % (ref 36.0–46.0)
Hemoglobin: 14.4 g/dL (ref 12.0–15.0)
Lymphocytes Relative: 24.7 % (ref 12.0–46.0)
Lymphs Abs: 1.5 10*3/uL (ref 0.7–4.0)
MCHC: 32.9 g/dL (ref 30.0–36.0)
MCV: 100.9 fl — ABNORMAL HIGH (ref 78.0–100.0)
Monocytes Absolute: 0.5 10*3/uL (ref 0.1–1.0)
Monocytes Relative: 8.5 % (ref 3.0–12.0)
Neutro Abs: 3.8 10*3/uL (ref 1.4–7.7)
Neutrophils Relative %: 64.8 % (ref 43.0–77.0)
Platelets: 239 10*3/uL (ref 150.0–400.0)
RBC: 4.34 Mil/uL (ref 3.87–5.11)
RDW: 13.6 % (ref 11.5–15.5)
WBC: 5.9 10*3/uL (ref 4.0–10.5)

## 2021-09-17 LAB — VITAMIN D 25 HYDROXY (VIT D DEFICIENCY, FRACTURES): VITD: 42.62 ng/mL (ref 30.00–100.00)

## 2021-09-17 LAB — TSH: TSH: 1.45 u[IU]/mL (ref 0.35–5.50)

## 2021-09-17 MED ORDER — ALBUTEROL SULFATE HFA 108 (90 BASE) MCG/ACT IN AERS: 2.0000 | INHALATION_SPRAY | Freq: Four times a day (QID) | RESPIRATORY_TRACT | 11 refills | Status: DC | PRN

## 2021-09-17 MED ORDER — LEVOTHYROXINE SODIUM 100 MCG PO TABS: 100.0000 ug | ORAL_TABLET | Freq: Every day | ORAL | 3 refills | Status: DC

## 2021-09-17 MED ORDER — FLUTICASONE-SALMETEROL 250-50 MCG/ACT IN AEPB: 1.0000 | INHALATION_SPRAY | Freq: Two times a day (BID) | RESPIRATORY_TRACT | 11 refills | Status: DC

## 2021-09-17 MED ORDER — ALENDRONATE SODIUM 70 MG PO TABS: 70.0000 mg | ORAL_TABLET | ORAL | 11 refills | Status: DC

## 2021-09-17 NOTE — Progress Notes (Signed)
Kendra Ball , 27-Dec-1945, 76 y.o., female MRN: [id number] Patient Care Team    Relationship Specialty Notifications Start End  Kendra Hillock, DO PCP - General Family Medicine  12/30/14   Kendra Craver, MD Consulting Physician Gastroenterology  05/09/14   Kendra Seltzer, MD (Inactive) Consulting Physician General Surgery  05/09/14   Kendra Ball, [place] (Inactive)  Psychology  10/16/15   Kendra Cradle, MD  Psychiatry  12/12/16   Kendra Pier, MD Consulting Physician Oncology  04/23/20     Chief Complaint  Patient presents with   Osteoporosis    Cmc; pt is not fasting     Subjective: Pt presents for an OV to f/u [facility]  COPD (chronic obstructive pulmonary disease) with chronic bronchitis ([place]) Patient reports use of albuterol only to control her COPD.   Hypothyroidism due to acquired atrophy of thyroid Reports compliance with levothyroxine 100 mcg daily.  She is due for labs today.  Age-related osteoporosis without current pathological fracture Patient reports compliance with vitamin D supplementation and Fosamax.  Last DEXA greater than 2 years ago with a T score of -3.1.      09/17/2021    1:21 PM 05/06/2021    4:43 PM 01/28/2021    4:32 PM 01/19/2021   10:41 AM 11/25/2020    3:19 PM  Depression screen PHQ 2/9  Decreased Interest 0   0 0  Down, Depressed, Hopeless 0   0 0  PHQ - 2 Score 0   0 0  Altered sleeping 0   0   Tired, decreased energy 1   2   Change in appetite 1   0   Feeling bad or failure about yourself  0   0   Trouble concentrating 0   0   Moving slowly or fidgety/restless 0   0   Suicidal thoughts 0   0   PHQ-9 Score 2   2      Information is confidential and restricted. Go to Review Flowsheets to unlock data.    Allergies  Allergen Reactions   Morphine Sulfate Swelling and Other (See Comments)    Swelling around injection area   Social History   Social History Narrative   Kendra Ball lives in [place] with her husband. She has 3  grown children and several grand children. She has joint custody of 3 of her grand children as her daughter has bipolar disorder and has needed assistance with children in past.   1-2 cups coffee, 3-6 cups tea per day.   Right-handed.   Past Medical History:  Diagnosis Date   Abnormal mammogram 10/01/2013   Diagnostic [facility] & [place] confirm cyst & duct ectasia of L breast. 08/2013.    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 RF: smoker, fam Hx, sedentary HDL is high!!    Dyspnea on exertion 05/08/2020   Essential hypertension 05/22/2014  Family history of malignant neoplasm of gastrointestinal tract 05/05/2020   Family history of malignant neoplasm of gastrointestinal tract 05/05/2020   Family history of premature coronary heart disease 08/13/2013   Father, brother, sister Personal risk factors: smoker, female over 20, fam Hx, sedentary, thyroid disease, dyslipidemia, BMI over 25.    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     Insomnia 05/05/2015   Iron deficiency anemia 05/05/2020   Lymphoma ([place]) 2019    B-cell   MDD (major depressive disorder), recurrent episode, moderate ([place]) 01/30/2014   [facility] 05/2015 08/18/2015:  Effexor XR '225mg'$  po qD for depression and anxiety Wellbutrin SR to '200mg'$  po BID for depression   Xanax 0.'5mg'$  po BID and '1mg'$  po qHS prn anxiety       Routine PRN Medications:  Yes    Consultations: encouraged to f/up with PCP as needed, encouraged to start individual therapy   Safety Concerns:  Pt denies SI and is at an acute low risk for suicide.Patient told to call clinic i   Multiple fractures 01/29/2019   Obesity    Osteoporosis    Paresthesia 08/06/2019   Port-A-Cath in place 12/27/2017   Pulmonary nodule seen on imaging study 02/13/2015   Smoker 09/14/2015   Substance abuse [facility])    inpatient and outpatient tx for substance abuse   TOBACCO USER 12/25/2009   Tremor 05/23/2019   Past Surgical History:  Procedure Laterality Date   AXILLARY LYMPH NODE BIOPSY Right 06/22/2017   Procedure: RIGHT AXILLARY LYMPH NODE BIOPSY;  Surgeon: [name], MD;  Location: [facility];  Service: General;  Laterality: Right;   BARIATRIC SURGERY  2006   CESAREAN SECTION     COLON RESECTION N/A 05/09/2014   Procedure: LAPAROSCOPIC HAND-ASSISTED EXTENDED RIGHT COLECTOMY, LAPAROSCOPIC LYSIS OF ADHESIONS, SPLENIC FLEXURE MOBILIZATION.;  Surgeon: [name], MD;  Location: [facility];  Service: General;  Laterality: N/A;   DILATION AND CURETTAGE OF UTERUS     x2   FOREARM FRACTURE SURGERY     right   GASTRIC BYPASS  2006   IR IMAGING GUIDED PORT INSERTION  12/01/2017   IR REMOVAL TUN ACCESS W/ PORT W/O FL MOD SED  07/03/2018   MASS EXCISION Right 02/24/2017   Procedure: EXCISIONAL BIOPSY RIGHT SUPRA-CLAVICULAR NODE;  Surgeon: [name], MD;  Location: [facility];  Service: ENT;  Laterality: Right;   TONSILLECTOMY     Family History  Problem Relation Age of Onset   Cancer Mother        colon   Depression Mother    Alzheimer's disease Mother        Dementia   Heart disease Father 57    Heart disease Sister 55       stents, pacemaker   Depression Sister    Heart disease Brother 48       CABG, MI   Depression Brother    Obesity Daughter    Suicidality Daughter    Bipolar disorder Daughter    Obesity Son    Mental illness Daughter        bipolar   Obesity  Daughter    Depression Sister    Dementia Paternal Aunt    Alcohol abuse Maternal Uncle    Colon cancer Maternal Aunt    Stomach cancer Maternal Grandfather    Allergies as of 09/17/2021       Reactions   Morphine Sulfate Swelling, Other (See Comments)   Swelling around injection area        Medication List        Accurate as of Sep 17, 2021  1:29 PM. If you have any questions, ask your nurse or doctor.          albuterol 108 (90 Base) MCG/ACT inhaler Commonly known as: VENTOLIN HFA Inhale 2 puffs into the lungs every 6 (six) hours as needed for wheezing or shortness of breath.   alendronate 70 MG tablet Commonly known as: FOSAMAX Take 1 tablet (70 mg total) by mouth every 7 (seven) days. Take with a full glass of water on an empty stomach.   aspirin EC 81 MG tablet Take 1 tablet (81 mg total) by mouth daily. Swallow whole.   buPROPion 150 MG 12 hr tablet Commonly known as: WELLBUTRIN SR Take 1 tablet (150 mg total) by mouth 2 (two) times daily.   busPIRone 10 MG tablet Commonly known as: BUSPAR Take 1 tablet (10 mg total) by mouth 3 (three) times daily.   clotrimazole 1 % cream Commonly known as: Clotrimazole Anti-Fungal Apply 1 application topically 2 (two) times daily.   Cyanocobalamin 2500 MCG Subl 1 tab sublingual daily What changed:  how much to take how to take this when to take this   ferrous fumarate 325 (106 Fe) MG Tabs tablet Commonly known as: HEMOCYTE - 106 mg FE Take 1 tablet by mouth daily. Reported on 10/06/2015   fluticasone-salmeterol 250-50 MCG/ACT Aepb Commonly known as: Wixela Inhub Inhale 1 puff into the lungs in the morning and at bedtime.    levothyroxine 100 MCG tablet Commonly known as: SYNTHROID Take 1 tablet (100 mcg total) by mouth daily before breakfast.   Magnesium 250 MG Tabs Take 250 mg by mouth daily.   metoprolol tartrate 100 MG tablet Commonly known as: LOPRESSOR Take 1 tablet (100 mg total) by mouth once for 1 dose. Please take 2 hours before CT   ranolazine 500 MG 12 hr tablet Commonly known as: RANEXA Take 1 tablet (500 mg total) by mouth 2 (two) times daily.   rosuvastatin 10 MG tablet Commonly known as: CRESTOR Take 1 tablet (10 mg total) by mouth daily.   traZODone 100 MG tablet Commonly known as: DESYREL Take 2 tablets (200 mg total) by mouth at bedtime.   venlafaxine XR 150 MG 24 hr capsule Commonly known as: EFFEXOR-XR Take 1 capsule (150 mg total) by mouth daily with breakfast.        All past medical history, surgical history, allergies, family history, immunizations andmedications were updated in the EMR today and reviewed under the history and medication portions of their EMR.     ROS Negative, with the exception of above mentioned in HPI   Objective:  BP 97/62   Pulse 63   Temp 98.9 F (37.2 C) (Oral)   Ht 5' 3.5" (1.613 m)   Wt 171 lb (77.6 kg)   SpO2 94%   BMI 29.82 kg/m  Body mass index is 29.82 kg/m. Physical Exam Vitals and nursing note reviewed.  Constitutional:      General: She is not in acute distress.    Appearance: Normal appearance.  She is not ill-appearing, toxic-appearing or diaphoretic.  HENT:     Head: Normocephalic and atraumatic.  Eyes:     General: No scleral icterus.       Right eye: No discharge.        Left eye: No discharge.     Extraocular Movements: Extraocular movements intact.     Conjunctiva/sclera: Conjunctivae normal.     Pupils: Pupils are equal, round, and reactive to light.  Cardiovascular:     Rate and Rhythm: Normal rate and regular rhythm.  Pulmonary:     Effort: Pulmonary effort is normal. No respiratory distress.      Breath sounds: Normal breath sounds. No wheezing, rhonchi or rales.  Musculoskeletal:     Cervical back: Neck supple. No tenderness.     Right lower leg: No edema.     Left lower leg: No edema.  Lymphadenopathy:     Cervical: No cervical adenopathy.  Skin:    General: Skin is warm and dry.     Coloration: Skin is not jaundiced or pale.     Findings: No erythema or rash.  Neurological:     Mental Status: She is alert and oriented to person, place, and time. Mental status is at baseline.     Motor: No weakness.     Gait: Gait normal.  Psychiatric:        Mood and Affect: Mood normal.        Behavior: Behavior normal.        Thought Content: Thought content normal.        Judgment: Judgment normal.    No results found. No results found. No results found for this or any previous visit (from the past 24 hour(s)).  Assessment/Plan: NAYELI CALVERT is a 76 y.o. female present for OV for  COPD (chronic obstructive pulmonary disease) with chronic bronchitis (HCC) Continue wixella BID.  - continue albuterol Prn - albuterol (VENTOLIN HFA) 108 (90 Base) MCG/ACT inhaler; Inhale 2 puffs into the lungs every 6 (six) hours as needed for wheezing or shortness of breath.  Dispense: 8 g; Refill: 11 - CBC w/Diff  Hypothyroidism due to acquired atrophy of thyroid Continue levo 100 mcg qd> dose will be refilled in appropriate dose after labs.  - TSH - CBC w/Diff  Age-related osteoporosis without current pathological fracture - Vitamin D (25 hydroxy) - DG Bone Density; Future  MDD (major depressive disorder), recurrent episode, moderate ([place]) Managed by psychiatry  Diffuse large B-cell lymphoma, unspecified body region (HCC)/ H/O malignant lymphoma Managed by oncology.   Aortic atherosclerosis (HCC)/Peripheral vascular disease, unspecified (HCC) bilateral carotic arterial disease Managed by cardiology   Reviewed expectations re: course of current medical issues. Discussed  self-management of symptoms. Outlined signs and symptoms indicating need for more acute intervention. Patient verbalized understanding and all questions were answered. Patient received an After-Visit Summary.    No orders of the defined types were placed in this encounter.  No orders of the defined types were placed in this encounter.  Referral Orders  No referral(s) requested today     Note is dictated utilizing voice recognition software. Although note has been proof read prior to signing, occasional typographical errors still can be missed. If any questions arise, please do not hesitate to call for verification.   electronically signed by:  Howard Pouch, DO  [place]

## 2021-09-17 NOTE — Patient Instructions (Signed)
According to your medical record, it is time for you to schedule a Colonoscopy.   The American Cancer Society recommends this procedure as a method to detect early colon cancer. Patients with a family history of colon cancer, or a personal history of colon polyps or inflammatory bowel disease are at increased risk.  Please call your GI office at [phone] to schedule this appointment or to update your records at your earliest convenience.          Great to see you today.  I have refilled the medication(s) we provide.   If labs were collected, we will inform you of lab results once received either by echart message or telephone call.   - echart message- for normal results that have been seen by the patient already.   - telephone call: abnormal results or if patient has not viewed results in their echart.  Follow up May 2024 for chronic condition follow up-sooner if needed

## 2021-09-27 ENCOUNTER — Other Ambulatory Visit (HOSPITAL_COMMUNITY): Payer: Self-pay | Admitting: Psychiatry

## 2021-09-27 DIAGNOSIS — F331 Major depressive disorder, recurrent, moderate: Secondary | ICD-10-CM

## 2021-09-27 DIAGNOSIS — F17200 Nicotine dependence, unspecified, uncomplicated: Secondary | ICD-10-CM

## 2021-09-30 ENCOUNTER — Other Ambulatory Visit (HOSPITAL_COMMUNITY): Payer: Self-pay | Admitting: Psychiatry

## 2021-09-30 DIAGNOSIS — F17200 Nicotine dependence, unspecified, uncomplicated: Secondary | ICD-10-CM

## 2021-09-30 DIAGNOSIS — F331 Major depressive disorder, recurrent, moderate: Secondary | ICD-10-CM

## 2021-10-08 ENCOUNTER — Other Ambulatory Visit (HOSPITAL_COMMUNITY): Payer: Self-pay | Admitting: Psychiatry

## 2021-10-08 DIAGNOSIS — F1020 Alcohol dependence, uncomplicated: Secondary | ICD-10-CM

## 2021-10-08 DIAGNOSIS — F331 Major depressive disorder, recurrent, moderate: Secondary | ICD-10-CM

## 2021-10-08 DIAGNOSIS — F5105 Insomnia due to other mental disorder: Secondary | ICD-10-CM

## 2021-10-14 ENCOUNTER — Telehealth (HOSPITAL_BASED_OUTPATIENT_CLINIC_OR_DEPARTMENT_OTHER): Payer: Medicare PPO | Admitting: Psychiatry

## 2021-10-14 DIAGNOSIS — F99 Mental disorder, not otherwise specified: Secondary | ICD-10-CM

## 2021-10-14 DIAGNOSIS — F17200 Nicotine dependence, unspecified, uncomplicated: Secondary | ICD-10-CM

## 2021-10-14 DIAGNOSIS — F5105 Insomnia due to other mental disorder: Secondary | ICD-10-CM | POA: Diagnosis not present

## 2021-10-14 DIAGNOSIS — F411 Generalized anxiety disorder: Secondary | ICD-10-CM | POA: Diagnosis not present

## 2021-10-14 DIAGNOSIS — F1020 Alcohol dependence, uncomplicated: Secondary | ICD-10-CM | POA: Diagnosis not present

## 2021-10-14 DIAGNOSIS — F331 Major depressive disorder, recurrent, moderate: Secondary | ICD-10-CM

## 2021-10-14 MED ORDER — VENLAFAXINE HCL ER 150 MG PO CP24: 150.0000 mg | ORAL_CAPSULE | Freq: Every day | ORAL | 1 refills | Status: DC

## 2021-10-14 MED ORDER — TRAZODONE HCL 100 MG PO TABS: 200.0000 mg | ORAL_TABLET | Freq: Every day | ORAL | 1 refills | Status: DC

## 2021-10-14 MED ORDER — BUPROPION HCL ER (SR) 150 MG PO TB12: 150.0000 mg | ORAL_TABLET | Freq: Two times a day (BID) | ORAL | 1 refills | Status: DC

## 2021-10-14 MED ORDER — BUSPIRONE HCL 10 MG PO TABS: 10.0000 mg | ORAL_TABLET | Freq: Three times a day (TID) | ORAL | 1 refills | Status: DC

## 2021-10-14 NOTE — Progress Notes (Signed)
Virtual Visit via Video Note  I connected with [name] on 10/14/21 at  2:15 PM EDT by a video enabled telemedicine application and verified that I am speaking with the correct person using two identifiers.  Location: Patient: home Provider: office   I discussed the limitations of evaluation and management by telemedicine and the availability of in person appointments. The patient expressed understanding and agreed to proceed.  History of Present Illness: Kendra Ball has been doing well. She has been active with family and enjoys it. She feels down 2-3 days a month. On those days she feels down, spend her time watching tv or playing on her phone and eats more than usual. Her sleep and energy are good. She denies SI/HI. Her anxiety is mild and situational. She denies any alcohol use.    Observations/Objective: Psychiatric Specialty Exam: ROS  There were no vitals taken for this visit.There is no height or weight on file to calculate BMI.  General Appearance: Casual and Neat  Eye Contact:  Good  Speech:  Clear and Coherent and Normal Rate  Volume:  Normal  Mood:  Euthymic  Affect:  Full Range  Thought Process:  Goal Directed, Linear, and Descriptions of Associations: Intact  Orientation:  Full (Time, Place, and Person)  Thought Content:  Logical  Suicidal Thoughts:  No  Homicidal Thoughts:  No  Memory:  Immediate;   Good  Judgement:  Good  Insight:  Good  Psychomotor Activity:  Normal  Concentration:  Concentration: Good  Recall:  Good  Fund of Knowledge:  Good  Language:  Good  Akathisia:  No  Handed:  Right  AIMS (if indicated):     Assets:  Communication Skills Desire for Improvement Financial Resources/Insurance Housing Leisure Time Resilience Social Support Talents/Skills Transportation Vocational/Educational  ADL's:  Intact  Cognition:  WNL  Sleep:        Assessment and Plan:     10/14/2021    2:21 PM 09/17/2021    1:21 PM 05/06/2021    4:43 PM  01/28/2021    4:32 PM 01/19/2021   10:41 AM  Depression screen PHQ 2/9  Decreased Interest 0 0 0 0 0  Down, Depressed, Hopeless 1 0 1 0 0  PHQ - 2 Score 1 0 1 0 0  Altered sleeping  0  0 0  Tired, decreased energy  1  0 2  Change in appetite  1  2 0  Feeling bad or failure about yourself   0  0 0  Trouble concentrating  0  0 0  Moving slowly or fidgety/restless  0  0 0  Suicidal thoughts  0  0 0  PHQ-9 Score  '2  2 2    '$ Flowsheet Row Video Visit from 10/14/2021 in [facility] Video Visit from 05/06/2021 in [facility] Video Visit from 01/28/2021 in [facility] No Risk No Risk No Risk        Status of current problems: stable  Meds:  1. Nicotine use disorder - buPROPion (WELLBUTRIN SR) 150 MG 12 hr tablet; Take 1 tablet (150 mg total) by mouth 2 (two) times daily.  Dispense: 180 tablet; Refill: 1  2. MDD (major depressive disorder), recurrent episode, moderate (HCC) - buPROPion (WELLBUTRIN SR) 150 MG 12 hr tablet; Take 1 tablet (150 mg total) by mouth 2 (two) times daily.  Dispense: 180 tablet; Refill: 1 - busPIRone (BUSPAR) 10 MG tablet; Take  1 tablet (10 mg total) by mouth 3 (three) times daily.  Dispense: 270 tablet; Refill: 1 - traZODone (DESYREL) 100 MG tablet; Take 2 tablets (200 mg total) by mouth at bedtime.  Dispense: 180 tablet; Refill: 1 - venlafaxine XR (EFFEXOR-XR) 150 MG 24 hr capsule; Take 1 capsule (150 mg total) by mouth daily with breakfast.  Dispense: 90 capsule; Refill: 1  3. Alcohol use disorder, moderate, dependence (HCC) - busPIRone (BUSPAR) 10 MG tablet; Take 1 tablet (10 mg total) by mouth 3 (three) times daily.  Dispense: 270 tablet; Refill: 1 - traZODone (DESYREL) 100 MG tablet; Take 2 tablets (200 mg total) by mouth at bedtime.  Dispense: 180 tablet; Refill: 1  4. GAD (generalized anxiety disorder) - busPIRone  (BUSPAR) 10 MG tablet; Take 1 tablet (10 mg total) by mouth 3 (three) times daily.  Dispense: 270 tablet; Refill: 1 - venlafaxine XR (EFFEXOR-XR) 150 MG 24 hr capsule; Take 1 capsule (150 mg total) by mouth daily with breakfast.  Dispense: 90 capsule; Refill: 1  5. Insomnia due to other mental disorder - traZODone (DESYREL) 100 MG tablet; Take 2 tablets (200 mg total) by mouth at bedtime.  Dispense: 180 tablet; Refill: 1     Labs: none    Therapy: brief supportive therapy provided.     Collaboration of Care: Other none  Patient/Guardian was advised Release of Information must be obtained prior to any record release in order to collaborate their care with an outside provider. Patient/Guardian was advised if they have not already done so to contact the registration department to sign all necessary forms in order for [place] to release information regarding their care.   Consent: Patient/Guardian gives verbal consent for treatment and assignment of benefits for services provided during this visit. Patient/Guardian expressed understanding and agreed to proceed.    Follow Up Instructions: Follow up in 5-6 months  or sooner if needed    I discussed the assessment and treatment plan with the patient. The patient was provided an opportunity to ask questions and all were answered. The patient agreed with the plan and demonstrated an understanding of the instructions.   The patient was advised to call back or seek an in-person evaluation if the symptoms worsen or if the condition fails to improve as anticipated.  I provided 9 minutes of non-face-to-face time during this encounter.   [name], MD

## 2021-10-21 ENCOUNTER — Telehealth (HOSPITAL_COMMUNITY): Payer: Medicare PPO | Admitting: Psychiatry

## 2021-11-16 ENCOUNTER — Ambulatory Visit
Admission: RE | Admit: 2021-11-16 | Discharge: 2021-11-16 | Disposition: A | Discharge status: Home / Self Care | Payer: Medicare PPO | Source: Ambulatory Visit | Attending: Family Medicine | Admitting: Family Medicine

## 2021-11-16 ENCOUNTER — Telehealth: Payer: Self-pay | Admitting: Family Medicine

## 2021-11-16 DIAGNOSIS — M8588 Other specified disorders of bone density and structure, other site: Secondary | ICD-10-CM | POA: Diagnosis not present

## 2021-11-16 DIAGNOSIS — Z780 Asymptomatic menopausal state: Secondary | ICD-10-CM | POA: Diagnosis not present

## 2021-11-16 DIAGNOSIS — M810 Age-related osteoporosis without current pathological fracture: Secondary | ICD-10-CM | POA: Diagnosis not present

## 2021-11-16 NOTE — Telephone Encounter (Signed)
Please inform patient her bone density halted with decreased bone density in the osteoporosis range.  It is a little improved from 2 years ago.  Spine -2.4 > improved  (was -2.7) Femur -2.8> improved (was -3.1)  We will set the arm was imaged this time and had not been imaged in the past.  Her arm is extremely osteoporotic with a -4.4.   Options are continuing Fosamax once weekly versus considering Prolia injections every 6 months if insurance covers.   If she elects to start Prolia injections, please start authorization process.  If not approved, then I would recommend she continue the Fosamax once weekly.

## 2021-11-16 NOTE — Telephone Encounter (Signed)
Benefit verification started

## 2021-11-16 NOTE — Telephone Encounter (Signed)
Spoke with pt regarding labs and instructions. Pt would like to do the prolia inj

## 2021-11-18 NOTE — Telephone Encounter (Signed)
Adalyne Urieta (Key: [id number]) - [id number] Prolia '60MG'$ /ML syringes Status: PA RequestCreated: July 27th, 2023Sent: July 27th, 2023

## 2021-11-19 NOTE — Telephone Encounter (Signed)
Received approval from insurance waiting on amgen SOB

## 2021-11-29 NOTE — Telephone Encounter (Signed)
Pt is scheduled for 12/01/21

## 2021-12-01 ENCOUNTER — Ambulatory Visit (INDEPENDENT_AMBULATORY_CARE_PROVIDER_SITE_OTHER): Payer: Medicare PPO

## 2021-12-01 DIAGNOSIS — Z1231 Encounter for screening mammogram for malignant neoplasm of breast: Secondary | ICD-10-CM

## 2021-12-01 DIAGNOSIS — M810 Age-related osteoporosis without current pathological fracture: Secondary | ICD-10-CM

## 2021-12-01 DIAGNOSIS — Z86010 Personal history of colonic polyps: Secondary | ICD-10-CM | POA: Diagnosis not present

## 2021-12-01 DIAGNOSIS — Z0000 Encounter for general adult medical examination without abnormal findings: Secondary | ICD-10-CM

## 2021-12-01 DIAGNOSIS — Z1211 Encounter for screening for malignant neoplasm of colon: Secondary | ICD-10-CM | POA: Diagnosis not present

## 2021-12-01 MED ORDER — DENOSUMAB 60 MG/ML ~~LOC~~ SOSY
60.0000 mg | PREFILLED_SYRINGE | Freq: Once | SUBCUTANEOUS | Status: AC
  Administered 2021-12-01: 60 mg via SUBCUTANEOUS

## 2021-12-01 NOTE — Progress Notes (Signed)
Virtual Visit via Telephone Note  I connected with  Kendra Ball on 12/01/21 at  2:45 PM EDT by telephone and verified that I am speaking with the correct person using two identifiers.  Medicare Annual Wellness visit completed telephonically due to Covid-19 pandemic.   Persons participating in this call: This Health Coach and this patient.   Location: Patient: home Provider: office    I discussed the limitations, risks, security and privacy concerns of performing an evaluation and management service by telephone and the availability of in person appointments. The patient expressed understanding and agreed to proceed.  Unable to perform video visit due to video visit attempted and failed and/or patient does not have video capability.   Some vital signs may be absent or patient reported.   Willette Brace, LPN   Subjective:   Kendra Ball is a 76 y.o. female who presents for Medicare Annual (Subsequent) preventive examination.  Review of Systems     Cardiac Risk Factors include: advanced age (>71mn, >>76women);dyslipidemia;hypertension     Objective:    There were no vitals filed for this visit. There is no height or weight on file to calculate BMI.     12/01/2021    2:43 PM 09/07/2021   10:49 AM 01/27/2021    8:55 AM 11/25/2020    3:05 PM 09/27/2020   12:29 PM 03/06/2020   11:31 AM 08/19/2019   12:05 PM  Advanced Directives  Does Patient Have a Medical Advance Directive? Yes No No No No No No  Does patient want to make changes to medical advance directive? Yes (MAU/Ambulatory/Procedural Areas - Information given)        Would patient like information on creating a medical advance directive?  No - Patient declined No - Patient declined No - Patient declined  No - Patient declined No - Patient declined    Current Medications (verified) Outpatient Encounter Medications as of 12/01/2021  Medication Sig   albuterol (VENTOLIN HFA) 108 (90 Base) MCG/ACT inhaler Inhale 2  puffs into the lungs every 6 (six) hours as needed for wheezing or shortness of breath.   aspirin EC 81 MG tablet Take 1 tablet (81 mg total) by mouth daily. Swallow whole.   buPROPion (WELLBUTRIN SR) 150 MG 12 hr tablet Take 1 tablet (150 mg total) by mouth 2 (two) times daily.   busPIRone (BUSPAR) 10 MG tablet Take 1 tablet (10 mg total) by mouth 3 (three) times daily.   clotrimazole (CLOTRIMAZOLE ANTI-FUNGAL) 1 % cream Apply 1 application topically 2 (two) times daily.   Cyanocobalamin 2500 MCG SUBL 1 tab sublingual daily   ferrous fumarate (HEMOCYTE - 106 MG FE) 325 (106 FE) MG TABS Take 1 tablet by mouth daily. Reported on 10/06/2015   fluticasone-salmeterol (WIXELA INHUB) 250-50 MCG/ACT AEPB Inhale 1 puff into the lungs in the morning and at bedtime.   levothyroxine (SYNTHROID) 100 MCG tablet Take 1 tablet (100 mcg total) by mouth daily before breakfast.   Magnesium 250 MG TABS Take 250 mg by mouth daily.    ranolazine (RANEXA) 500 MG 12 hr tablet Take 1 tablet (500 mg total) by mouth 2 (two) times daily.   rosuvastatin (CRESTOR) 10 MG tablet Take 1 tablet (10 mg total) by mouth daily.   traZODone (DESYREL) 100 MG tablet Take 2 tablets (200 mg total) by mouth at bedtime.   venlafaxine XR (EFFEXOR-XR) 150 MG 24 hr capsule Take 1 capsule (150 mg total) by mouth daily with breakfast.  metoprolol tartrate (LOPRESSOR) 100 MG tablet Take 1 tablet (100 mg total) by mouth once for 1 dose. Please take 2 hours before CT   [DISCONTINUED] alendronate (FOSAMAX) 70 MG tablet Take 1 tablet (70 mg total) by mouth every 7 (seven) days. Take with a full glass of water on an empty stomach.   No facility-administered encounter medications on file as of 12/01/2021.    Allergies (verified) Morphine sulfate   History: Past Medical History:  Diagnosis Date   Abnormal mammogram 10/01/2013   Diagnostic [facility] & [place] confirm cyst & duct ectasia of L breast. 08/2013.    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 RF: smoker, fam Hx, sedentary HDL is high!!    Dyspnea on exertion 05/08/2020   Essential hypertension 05/22/2014   Family history of malignant neoplasm of gastrointestinal tract 05/05/2020   Family history of malignant neoplasm of gastrointestinal tract 05/05/2020   Family history of premature coronary heart disease 08/13/2013   Father, brother, sister Personal risk factors: smoker, female over 16, fam Hx, sedentary, thyroid disease, dyslipidemia, BMI over 25.    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     Insomnia 05/05/2015   Iron deficiency anemia 05/05/2020   Lymphoma ([place]) 2019   B-cell   MDD (major depressive disorder), recurrent episode, moderate ([place]) 01/30/2014   [facility] 05/2015 08/18/2015:  Effexor XR '225mg'$  po qD for depression and anxiety Wellbutrin SR to '200mg'$  po BID for depression   Xanax 0.'5mg'$  po BID and '1mg'$  po qHS prn anxiety       Routine PRN Medications:  Yes    Consultations: encouraged to f/up with PCP as needed, encouraged to start individual therapy   Safety Concerns:  Pt denies SI and is at an  acute low risk for suicide.Patient told to call clinic i   Multiple fractures 01/29/2019   Obesity    Osteoporosis    Paresthesia 08/06/2019   Port-A-Cath in place 12/27/2017   Pulmonary nodule seen on imaging study 02/13/2015   Smoker 09/14/2015   Substance abuse [facility])    inpatient and outpatient tx for substance abuse   TOBACCO USER 12/25/2009   Tremor 05/23/2019   Past Surgical History:  Procedure Laterality Date   AXILLARY LYMPH NODE BIOPSY Right 06/22/2017   Procedure: RIGHT AXILLARY LYMPH NODE BIOPSY;  Surgeon: [name], MD;  Location: [facility];  Service: General;  Laterality: Right;   BARIATRIC SURGERY  2006   CESAREAN SECTION     COLON RESECTION N/A 05/09/2014   Procedure: LAPAROSCOPIC HAND-ASSISTED  EXTENDED RIGHT COLECTOMY, LAPAROSCOPIC LYSIS OF ADHESIONS, SPLENIC FLEXURE MOBILIZATION.;  Surgeon: [name], MD;  Location: [facility];  Service: General;  Laterality: N/A;   DILATION AND CURETTAGE OF UTERUS     x2   FOREARM FRACTURE SURGERY     right   GASTRIC BYPASS  2006   IR IMAGING GUIDED PORT INSERTION  12/01/2017   IR REMOVAL TUN ACCESS W/ PORT W/O FL MOD SED  07/03/2018   MASS EXCISION Right 02/24/2017   Procedure: EXCISIONAL BIOPSY RIGHT SUPRA-CLAVICULAR NODE;  Surgeon: [name], MD;  Location: [place];  Service: ENT;  Laterality: Right;   TONSILLECTOMY     Family History  Problem Relation Age of Onset   Cancer Mother        colon   Depression Mother    Alzheimer's disease Mother        Dementia   Heart disease Father 9   Heart disease Sister 69       stents, pacemaker   Depression Sister    Heart disease Brother 101       CABG, MI   Depression Brother    Obesity Daughter    Suicidality Daughter    Bipolar disorder Daughter    Obesity Son    Mental illness Daughter        bipolar   Obesity Daughter    Depression Sister    Dementia Paternal Aunt    Alcohol abuse Maternal Uncle    Colon cancer Maternal Aunt    Stomach cancer Maternal  Grandfather    Social History   Socioeconomic History   Marital status: Married    Spouse name: Not on file   Number of children: 3   Years of education: Not on file   Highest education level: 12th grade  Occupational History   Occupation: RETIRED    Employer: [employer]  Tobacco Use   Smoking status: Every Day    Packs/day: 1.00    Years: 50.00    Total pack years: 50.00    Types: Cigarettes   Smokeless tobacco: Never   Tobacco comments:    9 cigs/day on 05/06/2021  Vaping Use   Vaping Use: Never used  Substance and Sexual Activity   Alcohol use: No    Comment: quit on Aug 27, 2015   Drug use: No    Frequency: 20.0 times per week    Comment: last used May 2017   Sexual activity: Not Currently    Partners: Male    Birth control/protection: Post-menopausal  Other Topics Concern   Not on file  Social History Narrative   Ms. Roh lives in [place] with her husband. She has 3 grown children and several grand children. She has joint custody of 3 of her grand children as her daughter has bipolar disorder and has needed assistance with children in past.   1-2 cups coffee, 3-6 cups tea per day.   Right-handed.   Social Determinants of Health   Financial Resource Strain: Low Risk  (12/01/2021)   Overall Financial Resource Strain (CARDIA)    Difficulty of Paying Living Expenses: Not hard at all  Food Insecurity: No Food Insecurity (12/01/2021)   Hunger Vital Sign    Worried About Running Out of Food in the Last Year: Never true    Ran Out of Food in the Last Year: Never true  Transportation Needs: No Transportation Needs (12/01/2021)   PRAPARE - Hydrologist (Medical): No  Lack of Transportation (Non-Medical): No  Physical Activity: Insufficiently Active (12/01/2021)   Exercise Vital Sign    Days of Exercise per Week: 3 days    Minutes of Exercise per Session: 30 min  Stress: No Stress Concern Present (12/01/2021)   [place]    Feeling of Stress : Not at all  Social Connections: Moderately Isolated (12/01/2021)   Social Connection and Isolation Panel [NHANES]    Frequency of Communication with Friends and Family: More than three times a week    Frequency of Social Gatherings with Friends and Family: More than three times a week    Attends Religious Services: Never    Marine scientist or Organizations: No    Attends Music therapist: Never    Marital Status: Married    Tobacco Counseling Ready to quit: Not Answered Counseling given: Not Answered Tobacco comments: 9 cigs/day on 05/06/2021   Clinical Intake:  Pre-visit preparation completed: Yes  Pain : No/denies pain     BMI - recorded: 29.82 Nutritional Status: BMI 25 -29 Overweight Nutritional Risks: None Diabetes: No  How often do you need to have someone help you when you read instructions, pamphlets, or other written materials from your doctor or pharmacy?: 1 - Never  Diabetic?no  Interpreter Needed?: No  Information entered by :: Charlott Rakes, LPN   Activities of Daily Living    12/01/2021    2:44 PM  In your present state of health, do you have any difficulty performing the following activities:  Hearing? 0  Vision? 0  Difficulty concentrating or making decisions? 0  Walking or climbing stairs? 0  Dressing or bathing? 0  Doing errands, shopping? 0  Preparing Food and eating ? N  Using the Toilet? N  In the past six months, have you accidently leaked urine? N  Do you have problems with loss of bowel control? N  Managing your Medications? N  Managing your Finances? N  Housekeeping or managing your Housekeeping? N    Patient Care Team: Ma Hillock, DO as PCP - General (Family Medicine) Juanita Craver, MD as Consulting Physician (Gastroenterology) Excell Seltzer, MD (Inactive) as Consulting Physician (General Surgery) Brandon Melnick, [place] (Inactive)  (Psychology) [name], MD (Psychiatry) Ladell Pier, MD as Consulting Physician (Oncology)  Indicate any recent Medical Services you may have received from other than [facility] providers in the past year (date may be approximate).     Assessment:   This is a routine wellness examination for [place].  Hearing/Vision screen Hearing Screening - Comments:: Pt denies any hearing issues  Vision Screening - Comments:: Encouraged to follow up with provider   Dietary issues and exercise activities discussed: Current Exercise Habits: Home exercise routine, Type of exercise: walking, Time (Minutes): 30, Frequency (Times/Week): 3, Weekly Exercise (Minutes/Week): 90   Goals Addressed             This Visit's Progress    Patient Stated       Stop smoking        Depression Screen    12/01/2021    2:42 PM 10/14/2021    2:21 PM 09/17/2021    1:21 PM 05/06/2021    4:43 PM 01/28/2021    4:32 PM 01/19/2021   10:41 AM 11/25/2020    3:19 PM  PHQ 2/9 Scores  PHQ - 2 Score 0  0   0 0  PHQ- 9 Score   2  2      Information is confidential and restricted. Go to Review Flowsheets to unlock data.    Fall Risk    12/01/2021    2:43 PM 09/14/2021   10:33 AM 03/10/2021    3:48 PM 11/25/2020    3:08 PM 03/24/2020    9:38 AM  Fall Risk   Falls in the past year? 0 0 0 1 0  Number falls in past yr: 0  0 1 0  Comment    pulled a muscle in chest/ broken toe/ ankle.    step on curb/stodd up and fell   Injury with Fall? 0  0 1 0  Risk for fall due to : Impaired vision;Impaired balance/gait  No Fall Risks    Follow up Falls prevention discussed  Falls evaluation completed Falls evaluation completed;Falls prevention discussed Falls evaluation completed    FALL RISK PREVENTION PERTAINING TO THE HOME:  Any stairs in or around the home? Yes  If so, are there any without handrails? Yes  Home free of loose throw rugs in walkways, pet beds, electrical cords, etc? Yes  Adequate lighting in your home to  reduce risk of falls? Yes   ASSISTIVE DEVICES UTILIZED TO PREVENT FALLS:  Life alert? No  Use of a cane, walker or w/c? No  Grab bars in the bathroom? No  Shower chair or bench in shower? No  Elevated toilet seat or a handicapped toilet? No   TIMED UP AND GO:  Was the test performed? No .   Cognitive Function:        12/01/2021    2:45 PM  6CIT Screen  What Year? 0 points  What month? 0 points  What time? 0 points  Count back from 20 0 points  Months in reverse 0 points  Repeat phrase 0 points  Total Score 0 points    Immunizations Immunization History  Administered Date(s) Administered   Fluad Quad(high Dose 65+) 01/29/2019, 01/19/2021   Influenza Whole 05/07/2010   Influenza, High Dose Seasonal PF 05/25/2016, 02/16/2017   Influenza,inj,Quad PF,6+ Mos 03/13/2014, 12/30/2014, 02/05/2018   Influenza-Unspecified 03/03/2020   Moderna Sars-Covid-2 Vaccination 05/24/2019, 06/21/2019   PFIZER Comirnaty(Gray Top)Covid-19 Tri-Sucrose Vaccine 11/26/2020   PFIZER(Purple Top)SARS-COV-2 Vaccination 03/03/2020   PPD Test 09/20/2013   Pneumococcal Conjugate-13 09/10/2014   Pneumococcal Polysaccharide-23 08/13/2013   Tdap 08/08/2005    TDAP status: Due, Education has been provided regarding the importance of this vaccine. Advised may receive this vaccine at local pharmacy or Health Dept. Aware to provide a copy of the vaccination record if obtained from local pharmacy or Health Dept. Verbalized acceptance and understanding.  Flu Vaccine status: Up to date  Pneumococcal vaccine status: Up to date  Covid-19 vaccine status: Completed vaccines  Qualifies for Shingles Vaccine? Yes   Zostavax completed No   Shingrix Completed?: No.    Education has been provided regarding the importance of this vaccine. Patient has been advised to call insurance company to determine out of pocket expense if they have not yet received this vaccine. Advised may also receive vaccine at local pharmacy  or Health Dept. Verbalized acceptance and understanding.  Screening Tests Health Maintenance  Topic Date Due   COLONOSCOPY (Pts 45-96yr Insurance coverage will need to be confirmed)  01/20/2021   INFLUENZA VACCINE  11/23/2021   TETANUS/TDAP  01/19/2022 (Originally 08/09/2015)   COVID-19 Vaccine (5 - Booster) 10/04/2023 (Originally 01/21/2021)   Zoster Vaccines- Shingrix (1 of 2) 12/19/2023 (Originally 05/27/1964)   DEXA SCAN  11/17/2023   Pneumonia Vaccine 77+ Years old  Completed   Hepatitis C Screening  Completed   HPV VACCINES  Aged Out    Health Maintenance  Health Maintenance Due  Topic Date Due   COLONOSCOPY (Pts 45-5yr Insurance coverage will need to be confirmed)  01/20/2021   INFLUENZA VACCINE  11/23/2021    Colorectal cancer screening: Referral to GI placed 12/01/21. Pt aware the office will call re: appt.  Mammogram status: No longer required due to pe pt .  Bone Density status: Completed 11/16/21. Results reflect: Bone density results: OSTEOPOROSIS. Repeat every 2 years.  Additional Screening:  Hepatitis C Screening: Completed 06/06/17  Vision Screening: Recommended annual ophthalmology exams for early detection of glaucoma and other disorders of the eye. Is the patient up to date with their annual eye exam?  No  Who is the provider or what is the name of the office in which the patient attends annual eye exams? Encouraged to follow up If pt is not established with a provider, would they like to be referred to a provider to establish care? No .   Dental Screening: Recommended annual dental exams for proper oral hygiene  Community Resource Referral / Chronic Care Management: CRR required this visit?  No   CCM required this visit?  No      Plan:     I have personally reviewed and noted the following in the patient's chart:   Medical and social history Use of alcohol, tobacco or illicit drugs  Current medications and supplements including opioid  prescriptions.  Functional ability and status Nutritional status Physical activity Advanced directives List of other physicians Hospitalizations, surgeries, and ER visits in previous 12 months Vitals Screenings to include cognitive, depression, and falls Referrals and appointments  In addition, I have reviewed and discussed with patient certain preventive protocols, quality metrics, and best practice recommendations. A written personalized care plan for preventive services as well as general preventive health recommendations were provided to patient.     TWillette Brace LPN   80/10/1217  Nurse Notes: none

## 2021-12-01 NOTE — Patient Instructions (Signed)
Kendra Ball , Thank you for taking time to come for your Medicare Wellness Visit. I appreciate your ongoing commitment to your health goals. Please review the following plan we discussed and let me know if I can assist you in the future.   Screening recommendations/referrals: Colonoscopy: order placed 12/01/21 Mammogram: order placed 12/01/21 Bone Density: done 11/16/21 repeat every 2 years  Recommended yearly ophthalmology/optometry visit for glaucoma screening and checkup Recommended yearly dental visit for hygiene and checkup  Vaccinations: Influenza vaccine: done 01/19/21 repeat every year  Pneumococcal vaccine: Up to date Tdap vaccine: due Shingles vaccine: Shingrix discussed. Please contact your pharmacy for coverage information.    Covid-19:completed 1/29, 2/26, 03/03/20 & 11/26/20  Advanced directives: Advance directive discussed with you today. I have provided a copy for you to complete at home and have notarized. Once this is complete please bring a copy in to our office so we can scan it into your chart.  Conditions/risks identified: stop smoking  Next appointment: Follow up in one year for your annual wellness visit    Preventive Care 65 Years and Older, Female Preventive care refers to lifestyle choices and visits with your health care provider that can promote health and wellness. What does preventive care include? A yearly physical exam. This is also called an annual well check. Dental exams once or twice a year. Routine eye exams. Ask your health care provider how often you should have your eyes checked. Personal lifestyle choices, including: Daily care of your teeth and gums. Regular physical activity. Eating a healthy diet. Avoiding tobacco and drug use. Limiting alcohol use. Practicing safe sex. Taking low-dose aspirin every day. Taking vitamin and mineral supplements as recommended by your health care provider. What happens during an annual well check? The services  and screenings done by your health care provider during your annual well check will depend on your age, overall health, lifestyle risk factors, and family history of disease. Counseling  Your health care provider may ask you questions about your: Alcohol use. Tobacco use. Drug use. Emotional well-being. Home and relationship well-being. Sexual activity. Eating habits. History of falls. Memory and ability to understand (cognition). Work and work Statistician. Reproductive health. Screening  You may have the following tests or measurements: Height, weight, and BMI. Blood pressure. Lipid and cholesterol levels. These may be checked every 5 years, or more frequently if you are over 75 years old. Skin check. Lung cancer screening. You may have this screening every year starting at age 75 if you have a 30-pack-year history of smoking and currently smoke or have quit within the past 15 years. Fecal occult blood test (FOBT) of the stool. You may have this test every year starting at age 68. Flexible sigmoidoscopy or colonoscopy. You may have a sigmoidoscopy every 5 years or a colonoscopy every 10 years starting at age 75. Hepatitis C blood test. Hepatitis B blood test. Sexually transmitted disease (STD) testing. Diabetes screening. This is done by checking your blood sugar (glucose) after you have not eaten for a while (fasting). You may have this done every 1-3 years. Bone density scan. This is done to screen for osteoporosis. You may have this done starting at age 75. Mammogram. This may be done every 1-2 years. Talk to your health care provider about how often you should have regular mammograms. Talk with your health care provider about your test results, treatment options, and if necessary, the need for more tests. Vaccines  Your health care provider may recommend certain  vaccines, such as: Influenza vaccine. This is recommended every year. Tetanus, diphtheria, and acellular pertussis  (Tdap, Td) vaccine. You may need a Td booster every 10 years. Zoster vaccine. You may need this after age 75. Pneumococcal 13-valent conjugate (PCV13) vaccine. One dose is recommended after age 35. Pneumococcal polysaccharide (PPSV23) vaccine. One dose is recommended after age 11. Talk to your health care provider about which screenings and vaccines you need and how often you need them. This information is not intended to replace advice given to you by your health care provider. Make sure you discuss any questions you have with your health care provider. Document Released: 05/08/2015 Document Revised: 12/30/2015 Document Reviewed: 02/10/2015 Elsevier Interactive Patient Education  2017 [employer] Prevention in the Home Falls can cause injuries. They can happen to people of all ages. There are many things you can do to make your home safe and to help prevent falls. What can I do on the outside of my home? Regularly fix the edges of walkways and driveways and fix any cracks. Remove anything that might make you trip as you walk through a door, such as a raised step or threshold. Trim any bushes or trees on the path to your home. Use bright outdoor lighting. Clear any walking paths of anything that might make someone trip, such as rocks or tools. Regularly check to see if handrails are loose or broken. Make sure that both sides of any steps have handrails. Any raised decks and porches should have guardrails on the edges. Have any leaves, snow, or ice cleared regularly. Use sand or salt on walking paths during winter. Clean up any spills in your garage right away. This includes oil or grease spills. What can I do in the bathroom? Use night lights. Install grab bars by the toilet and in the tub and shower. Do not use towel bars as grab bars. Use non-skid mats or decals in the tub or shower. If you need to sit down in the shower, use a plastic, non-slip stool. Keep the floor dry. Clean  up any water that spills on the floor as soon as it happens. Remove soap buildup in the tub or shower regularly. Attach bath mats securely with double-sided non-slip rug tape. Do not have throw rugs and other things on the floor that can make you trip. What can I do in the bedroom? Use night lights. Make sure that you have a light by your bed that is easy to reach. Do not use any sheets or blankets that are too big for your bed. They should not hang down onto the floor. Have a firm chair that has side arms. You can use this for support while you get dressed. Do not have throw rugs and other things on the floor that can make you trip. What can I do in the kitchen? Clean up any spills right away. Avoid walking on wet floors. Keep items that you use a lot in easy-to-reach places. If you need to reach something above you, use a strong step stool that has a grab bar. Keep electrical cords out of the way. Do not use floor polish or wax that makes floors slippery. If you must use wax, use non-skid floor wax. Do not have throw rugs and other things on the floor that can make you trip. What can I do with my stairs? Do not leave any items on the stairs. Make sure that there are handrails on both sides of the stairs  and use them. Fix handrails that are broken or loose. Make sure that handrails are as long as the stairways. Check any carpeting to make sure that it is firmly attached to the stairs. Fix any carpet that is loose or worn. Avoid having throw rugs at the top or bottom of the stairs. If you do have throw rugs, attach them to the floor with carpet tape. Make sure that you have a light switch at the top of the stairs and the bottom of the stairs. If you do not have them, ask someone to add them for you. What else can I do to help prevent falls? Wear shoes that: Do not have high heels. Have rubber bottoms. Are comfortable and fit you well. Are closed at the toe. Do not wear sandals. If you  use a stepladder: Make sure that it is fully opened. Do not climb a closed stepladder. Make sure that both sides of the stepladder are locked into place. Ask someone to hold it for you, if possible. Clearly mark and make sure that you can see: Any grab bars or handrails. First and last steps. Where the edge of each step is. Use tools that help you move around (mobility aids) if they are needed. These include: Canes. Walkers. Scooters. Crutches. Turn on the lights when you go into a dark area. Replace any light bulbs as soon as they burn out. Set up your furniture so you have a clear path. Avoid moving your furniture around. If any of your floors are uneven, fix them. If there are any pets around you, be aware of where they are. Review your medicines with your doctor. Some medicines can make you feel dizzy. This can increase your chance of falling. Ask your doctor what other things that you can do to help prevent falls. This information is not intended to replace advice given to you by your health care provider. Make sure you discuss any questions you have with your health care provider. Document Released: 02/05/2009 Document Revised: 09/17/2015 Document Reviewed: 05/16/2014 Elsevier Interactive Patient Education  2017 [employer].

## 2021-12-01 NOTE — Progress Notes (Signed)
Kendra Ball is a 76 y.o. female presents to the office today for 1st Prolia injection. Prolia injections every 6 months per physician's orders.  Original order: 11/16/21 telephone note; Prolia injections every 6 months  Prolia(denosumab), '60mg'$ /mL,  subcutaneous (route) was administered back of left upper arm today. Patient tolerated injection. Patient next injection due: 6 months, appt made No  Kendra Ball

## 2021-12-02 ENCOUNTER — Other Ambulatory Visit: Payer: Self-pay | Admitting: Cardiology

## 2021-12-08 ENCOUNTER — Ambulatory Visit
Admission: RE | Admit: 2021-12-08 | Discharge: 2021-12-08 | Disposition: A | Discharge status: Home / Self Care | Payer: Medicare PPO | Source: Ambulatory Visit | Attending: Family Medicine | Admitting: Family Medicine

## 2021-12-08 DIAGNOSIS — Z1231 Encounter for screening mammogram for malignant neoplasm of breast: Secondary | ICD-10-CM

## 2021-12-09 ENCOUNTER — Ambulatory Visit: Payer: Medicare PPO | Admitting: Cardiology

## 2021-12-09 ENCOUNTER — Encounter: Payer: Self-pay | Admitting: Cardiology

## 2021-12-09 VITALS — BP 110/78 | HR 69 | Ht 63.5 in | Wt 175.0 lb

## 2021-12-09 DIAGNOSIS — F17200 Nicotine dependence, unspecified, uncomplicated: Secondary | ICD-10-CM

## 2021-12-09 DIAGNOSIS — I700 Atherosclerosis of aorta: Secondary | ICD-10-CM

## 2021-12-09 DIAGNOSIS — I10 Essential (primary) hypertension: Secondary | ICD-10-CM

## 2021-12-09 DIAGNOSIS — E785 Hyperlipidemia, unspecified: Secondary | ICD-10-CM

## 2021-12-09 DIAGNOSIS — R0989 Other specified symptoms and signs involving the circulatory and respiratory systems: Secondary | ICD-10-CM | POA: Diagnosis not present

## 2021-12-09 NOTE — Addendum Note (Signed)
Addended by: Jacobo Forest D on: 12/09/2021 11:07 AM   Modules accepted: Orders

## 2021-12-09 NOTE — Patient Instructions (Signed)
Medication Instructions:  Your physician recommends that you continue on your current medications as directed. Please refer to the Current Medication list given to you today.  *If you need a refill on your cardiac medications before your next appointment, please call your pharmacy*   Lab Work: None Ordered If you have labs (blood work) drawn today and your tests are completely normal, you will receive your results only by: MyChart Message (if you have MyChart) OR A paper copy in the mail If you have any lab test that is abnormal or we need to change your treatment, we will call you to review the results.   Testing/Procedures: Your physician has requested that you have a carotid duplex. This test is an ultrasound of the carotid arteries in your neck. It looks at blood flow through these arteries that supply the brain with blood. Allow one hour for this exam. There are no restrictions or special instructions.    Follow-Up: At [facility], you and your health needs are our priority.  As part of our continuing mission to provide you with exceptional heart care, we have created designated Provider Care Teams.  These Care Teams include your primary Cardiologist (physician) and Advanced Practice Providers (APPs -  Physician Assistants and Nurse Practitioners) who all work together to provide you with the care you need, when you need it.  We recommend signing up for the patient portal called "MyChart".  Sign up information is provided on this After Visit Summary.  MyChart is used to connect with patients for Virtual Visits (Telemedicine).  Patients are able to view lab/test results, encounter notes, upcoming appointments, etc.  Non-urgent messages can be sent to your provider as well.   To learn more about what you can do with MyChart, go to [email or web address].    Your next appointment:   6 month(s)  The format for your next appointment:   In Person  Provider:   Robert Krasowski, MD     Other Instructions NA  

## 2021-12-09 NOTE — Progress Notes (Signed)
Cardiology Office Note:    Date:  12/09/2021   ID:  Kendra Ball, DOB 11-Jun-1945, MRN [id number]  PCP:  Ma Hillock, DO  Cardiologist:  Jenne Campus, MD    Referring MD: Ma Hillock, DO   Chief Complaint  Patient presents with   Follow-up  Doing fine do have some shortness of breath sometimes  History of Present Illness:    Kendra Ball is a 76 y.o. female with quite interesting past medical history.  She did have a CT of her chest done showed some calcification of the coronary artery after that she had a stress test done which showed possibility of apical MI with peri-infarct ischemia, echocardiogram performed after that showed no evidence of anterior wall MI surprisingly shows some possibility of inferior wall MI very confusing story, eventually she end up having coronary CT angio which showed only mild disease in the LAD.  She does have peripheral vascular disease in form of right carotic artery stenosis, she is a smoker and does have dyslipidemia and essential hypertension. She is in my office today.  Denies have any chest pain tightness squeezing pressure mid chest described to have some shortness of breath.  She is struggling with quitting smoking she said she was able to reduce amount of cigarettes to 6 but now she is back to 1 pack and actually when I walked into the room and smell cigarettes.  Past Medical History:  Diagnosis Date   Abnormal mammogram 10/01/2013   Diagnostic [facility] & [place] confirm cyst & duct ectasia of L breast. 08/2013.    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 RF: smoker, fam Hx, sedentary HDL is high!!    Dyspnea on exertion 05/08/2020   Essential hypertension 05/22/2014   Family history of malignant neoplasm of gastrointestinal tract 05/05/2020   Family history of malignant neoplasm of gastrointestinal tract 05/05/2020   Family history of premature coronary heart disease 08/13/2013   Father, brother, sister Personal risk factors: smoker, female over 34, fam Hx, sedentary, thyroid disease, dyslipidemia, BMI over 25.    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     Insomnia 05/05/2015   Iron deficiency anemia 05/05/2020   Lymphoma ([place]) 2019   B-cell   MDD (major depressive disorder), recurrent episode, moderate ([place]) 01/30/2014   [facility] 05/2015 08/18/2015:  Effexor XR '225mg'$  po qD for depression and anxiety Wellbutrin SR to '200mg'$  po BID for depression   Xanax 0.'5mg'$  po BID and '1mg'$  po qHS prn anxiety  Routine PRN Medications:  Yes    Consultations: encouraged to f/up with PCP as needed, encouraged to start individual therapy   Safety Concerns:  Pt denies SI and is at an acute low risk for suicide.Patient told to call clinic i   Multiple fractures 01/29/2019   Obesity    Osteoporosis    Paresthesia 08/06/2019   Port-A-Cath in place 12/27/2017   Pulmonary nodule seen on imaging study 02/13/2015   Smoker 09/14/2015   Substance abuse ([place])    inpatient and outpatient tx for substance abuse   TOBACCO USER 12/25/2009   Tremor 05/23/2019    Past  Surgical History:  Procedure Laterality Date   AXILLARY LYMPH NODE BIOPSY Right 06/22/2017   Procedure: RIGHT AXILLARY LYMPH NODE BIOPSY;  Surgeon: [name], MD;  Location: [facility];  Service: General;  Laterality: Right;   BARIATRIC SURGERY  2006   CESAREAN SECTION     COLON RESECTION N/A 05/09/2014   Procedure: LAPAROSCOPIC HAND-ASSISTED EXTENDED RIGHT COLECTOMY, LAPAROSCOPIC LYSIS OF ADHESIONS, SPLENIC FLEXURE MOBILIZATION.;  Surgeon: [name], MD;  Location: [facility];  Service: General;  Laterality: N/A;   DILATION AND CURETTAGE OF UTERUS     x2   FOREARM FRACTURE SURGERY     right   GASTRIC BYPASS  2006   IR IMAGING GUIDED PORT INSERTION  12/01/2017   IR REMOVAL TUN ACCESS W/ PORT W/O FL MOD SED  07/03/2018   MASS EXCISION Right 02/24/2017   Procedure: EXCISIONAL BIOPSY RIGHT SUPRA-CLAVICULAR NODE;  Surgeon: [name], MD;  Location: [place];  Service: ENT;  Laterality: Right;   TONSILLECTOMY      Current Medications: Current Meds  Medication Sig   albuterol (VENTOLIN HFA) 108 (90 Base) MCG/ACT inhaler Inhale 2 puffs into the lungs every 6 (six) hours as needed for wheezing or shortness of breath.   aspirin EC 81 MG tablet Take 1 tablet (81 mg total) by mouth daily. Swallow whole.   buPROPion (WELLBUTRIN SR) 150 MG 12 hr tablet Take 1 tablet (150 mg total) by mouth 2 (two) times daily.   busPIRone (BUSPAR) 10 MG tablet Take 1 tablet (10 mg total) by mouth 3 (three) times daily.   clotrimazole (CLOTRIMAZOLE ANTI-FUNGAL) 1 % cream Apply 1 application topically 2 (two) times daily.   Cyanocobalamin 2500 MCG SUBL 1 tab sublingual daily   denosumab (PROLIA) 60 MG/ML SOSY injection Inject 60 mg into the skin every 6 (six) months.   ferrous fumarate (HEMOCYTE - 106 MG FE) 325 (106 FE) MG TABS Take 1 tablet by mouth daily. Reported on 10/06/2015   fluticasone-salmeterol (WIXELA INHUB) 250-50 MCG/ACT AEPB Inhale 1 puff into the lungs in the morning and at bedtime.    levothyroxine (SYNTHROID) 100 MCG tablet Take 1 tablet (100 mcg total) by mouth daily before breakfast.   Magnesium 250 MG TABS Take 250 mg by mouth daily.    ranolazine (RANEXA) 500 MG 12 hr tablet Take 1 tablet (500 mg total) by mouth 2 (two) times daily.   rosuvastatin (CRESTOR) 10 MG tablet Take 1 tablet (10 mg total) by mouth daily.   traZODone (DESYREL) 100 MG tablet Take 2 tablets (200 mg total) by mouth at bedtime.   venlafaxine XR (EFFEXOR-XR) 150 MG 24 hr capsule Take 1 capsule (150 mg total) by mouth daily with breakfast.     Allergies:   Morphine sulfate   Social History   Socioeconomic History   Marital status: Married    Spouse name: Not on  file   Number of children: 3   Years of education: Not on file   Highest education level: 12th grade  Occupational History   Occupation: RETIRED    Employer: [employer]  Tobacco Use   Smoking status: Every Day    Packs/day: 1.00    Years: 50.00    Total pack years: 50.00    Types: Cigarettes   Smokeless tobacco: Never   Tobacco comments:    9 cigs/day on 05/06/2021  Vaping Use   Vaping Use: Never used  Substance and Sexual Activity   Alcohol use: No    Comment: quit on Aug 27, 2015   Drug use: No    Frequency: 20.0 times per week    Comment: last used May 2017   Sexual activity: Not Currently    Partners: Male    Birth control/protection: Post-menopausal  Other Topics Concern   Not on file  Social History Narrative   Ms. Langton lives in [place] with her husband. She has 3 grown children and several grand children. She has joint custody of 3 of her grand children as her daughter has bipolar disorder and has needed assistance with children in past.   1-2 cups coffee, 3-6 cups tea per day.   Right-handed.   Social Determinants of Health   Financial Resource Strain: Low Risk  (12/01/2021)   Overall Financial Resource Strain (CARDIA)    Difficulty of Paying Living Expenses: Not hard at all  Food Insecurity:  No Food Insecurity (12/01/2021)   Hunger Vital Sign    Worried About Running Out of Food in the Last Year: Never true    Ran Out of Food in the Last Year: Never true  Transportation Needs: No Transportation Needs (12/01/2021)   PRAPARE - Hydrologist (Medical): No    Lack of Transportation (Non-Medical): No  Physical Activity: Insufficiently Active (12/01/2021)   Exercise Vital Sign    Days of Exercise per Week: 3 days    Minutes of Exercise per Session: 30 min  Stress: No Stress Concern Present (12/01/2021)   [place]    Feeling of Stress : Not at all  Social Connections: Moderately Isolated (12/01/2021)   Social Connection and Isolation Panel [NHANES]    Frequency of Communication with Friends and Family: More than three times a week    Frequency of Social Gatherings with Friends and Family: More than three times a week    Attends Religious Services: Never    Marine scientist or Organizations: No    Attends Music therapist: Never    Marital Status: Married     Family History: The patient's family history includes Alcohol abuse in her maternal uncle; Alzheimer's disease in her mother; Bipolar disorder in her daughter; Cancer in her mother; Colon cancer in her maternal aunt; Dementia in her paternal aunt; Depression in her brother, mother, sister, and sister; Heart disease (age of onset: 61) in her father; Heart disease (age of onset: 58) in her sister; Heart disease (age of onset: 4) in her brother; Mental illness in her daughter; Obesity in her daughter, daughter, and son; Stomach cancer in her maternal grandfather; Suicidality in her daughter. ROS:   Please see the history of present illness.    All 14 point review of systems negative except as described per history of present illness  EKGs/Labs/Other Studies Reviewed:      Recent Labs: 01/19/2021: ALT 19  07/22/2021: BUN  10; Creatinine, Ser 0.79; Potassium 4.3; Sodium 141 09/17/2021: Hemoglobin 14.4; Platelets 239.0; TSH 1.45  Recent Lipid Panel    Component Value Date/Time   CHOL 168 03/24/2020 1014   TRIG 86.0 03/24/2020 1014   HDL 89.90 03/24/2020 1014   CHOLHDL 2 03/24/2020 1014   VLDL 17.2 03/24/2020 1014   LDLCALC 61 03/24/2020 1014   LDLDIRECT 46 01/05/2021 1010   LDLDIRECT 133.5 08/09/2012 1036    Physical Exam:    VS:  BP 110/78 (BP Location: Right Arm, Patient Position: Sitting, Cuff Size: Normal)   Pulse 69   Ht 5' 3.5" (1.613 m)   Wt 175 lb (79.4 kg)   SpO2 92%   BMI 30.51 kg/m     Wt Readings from Last 3 Encounters:  12/09/21 175 lb (79.4 kg)  09/17/21 171 lb (77.6 kg)  09/07/21 173 lb (78.5 kg)     GEN:  Well nourished, well developed in no acute distress HEENT: Normal NECK: No JVD; No carotid bruits LYMPHATICS: No lymphadenopathy CARDIAC: RRR, no murmurs, no rubs, no gallops RESPIRATORY:  Clear to auscultation without rales, wheezing or rhonchi  ABDOMEN: Soft, non-tender, non-distended MUSCULOSKELETAL:  No edema; No deformity  SKIN: Warm and dry LOWER EXTREMITIES: no swelling NEUROLOGIC:  Alert and oriented x 3 PSYCHIATRIC:  Normal affect   ASSESSMENT:    1. Essential hypertension   2. Aortic atherosclerosis ([place])   3. Dyslipidemia   4. TOBACCO USER   5. Bruit of right carotid artery    PLAN:    In order of problems listed above:  Peripheral vascular disease, she does have carotic arterial stenosis.  She need to have another carotic ultrasounds in September which will be 6 months follow-up on prior study : Coronary artery disease which is nonobstructive based on coronary CT angio will reduce risk factors she is on antiplatelet therapy and also been on moderate intensity statin which I will continue Dyslipidemia, I did review her K PN which show me her LDL of 46 HDL 89 good cholesterol control continue moderate intensity statin Smoking: We did have a long  discussion about this again strongly recommended to quit hopefully she will be able to accomplish that.   Medication Adjustments/Labs and Tests Ordered: Current medicines are reviewed at length with the patient today.  Concerns regarding medicines are outlined above.  No orders of the defined types were placed in this encounter.  Medication changes: No orders of the defined types were placed in this encounter.   Signed, Park Liter, MD, [facility] 12/09/2021 10:34 AM    [place]

## 2021-12-10 ENCOUNTER — Other Ambulatory Visit: Payer: Self-pay | Admitting: Family Medicine

## 2021-12-10 DIAGNOSIS — R928 Other abnormal and inconclusive findings on diagnostic imaging of breast: Secondary | ICD-10-CM

## 2022-01-03 ENCOUNTER — Ambulatory Visit
Admission: RE | Admit: 2022-01-03 | Discharge: 2022-01-03 | Disposition: A | Discharge status: Home / Self Care | Payer: Medicare PPO | Source: Ambulatory Visit | Attending: Family Medicine | Admitting: Family Medicine

## 2022-01-03 ENCOUNTER — Other Ambulatory Visit: Payer: Self-pay | Admitting: Family Medicine

## 2022-01-03 DIAGNOSIS — R928 Other abnormal and inconclusive findings on diagnostic imaging of breast: Secondary | ICD-10-CM | POA: Diagnosis not present

## 2022-01-03 DIAGNOSIS — N6489 Other specified disorders of breast: Secondary | ICD-10-CM | POA: Diagnosis not present

## 2022-01-03 DIAGNOSIS — N6459 Other signs and symptoms in breast: Secondary | ICD-10-CM

## 2022-01-10 ENCOUNTER — Ambulatory Visit (INDEPENDENT_AMBULATORY_CARE_PROVIDER_SITE_OTHER): Payer: Medicare PPO

## 2022-01-10 DIAGNOSIS — R0989 Other specified symptoms and signs involving the circulatory and respiratory systems: Secondary | ICD-10-CM

## 2022-01-12 ENCOUNTER — Telehealth: Payer: Self-pay

## 2022-01-12 NOTE — Telephone Encounter (Signed)
Results reviewed with pt as per Dr. Krasowski's note.  Pt verbalized understanding and had no additional questions. Routed to PCP  

## 2022-03-10 ENCOUNTER — Inpatient Hospital Stay: Payer: Medicare PPO | Attending: Oncology | Admitting: Oncology

## 2022-03-10 VITALS — BP 123/59 | HR 78 | Temp 98.1°F | Resp 20 | Ht 63.5 in | Wt 177.2 lb

## 2022-03-10 DIAGNOSIS — C8338 Diffuse large B-cell lymphoma, lymph nodes of multiple sites: Secondary | ICD-10-CM | POA: Diagnosis not present

## 2022-03-10 DIAGNOSIS — Z79899 Other long term (current) drug therapy: Secondary | ICD-10-CM | POA: Insufficient documentation

## 2022-03-10 DIAGNOSIS — C8330 Diffuse large B-cell lymphoma, unspecified site: Secondary | ICD-10-CM | POA: Diagnosis not present

## 2022-03-10 DIAGNOSIS — I10 Essential (primary) hypertension: Secondary | ICD-10-CM | POA: Insufficient documentation

## 2022-03-10 DIAGNOSIS — E039 Hypothyroidism, unspecified: Secondary | ICD-10-CM | POA: Diagnosis not present

## 2022-03-10 DIAGNOSIS — R234 Changes in skin texture: Secondary | ICD-10-CM | POA: Insufficient documentation

## 2022-03-10 DIAGNOSIS — Z800 Family history of malignant neoplasm of digestive organs: Secondary | ICD-10-CM | POA: Diagnosis not present

## 2022-03-10 DIAGNOSIS — Z808 Family history of malignant neoplasm of other organs or systems: Secondary | ICD-10-CM | POA: Diagnosis not present

## 2022-03-10 DIAGNOSIS — E7800 Pure hypercholesterolemia, unspecified: Secondary | ICD-10-CM | POA: Insufficient documentation

## 2022-03-10 NOTE — Progress Notes (Signed)
[facility] OFFICE PROGRESS NOTE   Diagnosis: Non-Hodgkin's lymphoma  INTERVAL HISTORY:   Kendra Ball returns as scheduled.  She feels well.  No palpable lymph nodes. A routine mammogram 12/08/2021 revealed a possible asymmetry in the right breast.  A diagnostic right mammogram 01/03/2022 revealed persistent skin thickening at the right areola and anterior breast, new compared to 2021.  No mass or worrisome calcification.  No palpable abnormality.  The right areola appeared soft without thickening. An ultrasound showed mild skin thickening at the anterior right breast/areola.  Objective:  Vital signs in last 24 hours:  Blood pressure (!) 123/59, pulse 78, temperature 98.1 F (36.7 C), temperature source Oral, resp. rate 20, height 5' 3.5" (1.613 m), weight 177 lb 3.2 oz (80.4 kg), SpO2 98 %.    HEENT: Neck without mass Lymphatics: No cervical, supraclavicular, axillary, or inguinal nodes Resp: Lungs clear bilaterally Cardio: Rate and rhythm GI: No hepatosplenomegaly Vascular: No leg edema Breasts: Slight diffuse skin thickening at the right areola, no nodularity or discrete mass   Lab Results:  Lab Results  Component Value Date   WBC 5.9 09/17/2021   HGB 14.4 09/17/2021   HCT 43.8 09/17/2021   MCV 100.9 (H) 09/17/2021   PLT 239.0 09/17/2021   NEUTROABS 3.8 09/17/2021    CMP  Lab Results  Component Value Date   NA 141 07/22/2021   K 4.3 07/22/2021   CL 103 07/22/2021   CO2 26 07/22/2021   GLUCOSE 94 07/22/2021   BUN 10 07/22/2021   CREATININE 0.79 07/22/2021   CALCIUM 8.9 07/22/2021   PROT 6.1 01/19/2021   ALBUMIN 4.0 01/19/2021   AST 22 01/19/2021   ALT 19 01/19/2021   ALKPHOS 42 01/19/2021   BILITOT 0.5 01/19/2021   GFRNONAA >60 07/03/2018   GFRAA >60 07/03/2018     Medications: I have reviewed the patient's current medications.   Assessment/Plan: Non-Hodgkin's lymphoma, diffuse large B-cell lymphoma, CD20 positive Ultrasound of the  neck 02/18/2017- right supraclavicular adenopathy with at least 3 nodes, largest measuring 1.3 x 1.1 x 1.4 cm; 2 adjacent hypoechoic right occipital lymph nodes Excisional biopsy right supraclavicular lymphadenopathy 06/01/2534 (Dr. Deetta Perla lymphoid proliferation.  Necrotic soft tissue with associated acute and chronic inflammation.  By flow cytometry no monoclonal B-cell or phenotypically aberrant T-cell population.  Special stains for microorganisms including AFB, GMS, PAS and Warthin-Starry, are negative. CT chest 05/30/2017- multiple enlarged right axillary lymph nodes.  Largest right axillary lymph node measures 2.6 x 2.2 cm.  Single enlarged left axillary lymph node measuring 16 mm. CT neck 06/03/2017- known bilateral axillary lymphadenopathy partially covered on the scan.  No lymphadenopathy seen in the neck. Biopsy right axillary lymph node 06/22/2017 (Dr. Georgette Dover)- fibroadipose tissue with necrosis, chronic inflammation and granulation tissue PET scan 6/44/0347- hypermetabolic bilateral axillary lymph nodes.  Hypermetabolic but small right supraclavicular lymph nodes.  In the right omentum there is a small focus of rim density and internal fat density which is hypermetabolic. CT neck 10/25/2017- progressed bilateral subclavian lymphadenopathy since February, right greater than left.  Conspicuous enlargement of the small lymph nodes at the right level 2 and 3 nodal stations. CT chest/abdomen/pelvis 10/25/2017- progressive adenopathy identified within the left axilla, bilateral supraclavicular stations and left inguinal region.  Persistent but improved right axillary adenopathy.  Scattered indeterminate low-attenuation foci within the spleen not seen on study from 05/30/2017. Excisional biopsy of right supraclavicular and posterior triangle lymph nodes on 11/16/2017-diffuse large B-cell lymphoma, CD20 positive PET scan 11/28/2017- new  hypermetabolic lymph nodes and progressive hypermetabolism in existing  lymph nodes.  Hypermetabolic focus along the anterior aspect of the right scapula with adjacent soft tissue mass.  No new adenopathy involving the mediastinal or hilar regions of the chest or the abdomen or pelvis.  No obvious involvement of the liver or spleen.  IPI score 3 Cycle 1 CHOP/Rituxan 12/04/2017 Cycle 2 CHOP/Rituxan 12/27/2017 Cycle 3 CHOP/Rituxan 01/16/2018  Cycle 4 CHOP/Rituxan 02/05/2018 PET 02/19/2018- resolution of previously noted hypermetabolic adenopathy, no residual hypermetabolic osseous lesions Cycle 5 CHOP/rituximab 02/27/2018 Cycle 6 CHOP/rituximab 03/20/2018 Hypertension  Hypothyroid Hypercholesterolemia Family history significant for colon cancer, stomach cancer and brain cancer Ongoing tobacco use Port-A-Cath placement 12/01/2017, Interventional Radiology Left arm edema-negative venous Doppler 02/26/2018    Disposition: Kendra Ball is in clinical remission from non-Hodgkin's lymphoma.  She will return for an office visit in 6 months. A right mammogram/ultrasound reveals indeterminant thickening at the right areola and anterior breast.  No discrete masses noted.  She is scheduled for a follow-up right mammogram and ultrasound at the breast center  She plans to follow-up with the breast center radiologist.  I am available to see her as needed.  Betsy Coder, MD  03/10/2022  11:22 AM

## 2022-03-22 ENCOUNTER — Other Ambulatory Visit (HOSPITAL_BASED_OUTPATIENT_CLINIC_OR_DEPARTMENT_OTHER): Payer: Self-pay | Admitting: Cardiovascular Disease

## 2022-03-22 DIAGNOSIS — I6523 Occlusion and stenosis of bilateral carotid arteries: Secondary | ICD-10-CM

## 2022-04-07 ENCOUNTER — Other Ambulatory Visit: Payer: Medicare PPO

## 2022-04-07 ENCOUNTER — Telehealth (HOSPITAL_BASED_OUTPATIENT_CLINIC_OR_DEPARTMENT_OTHER): Payer: Medicare PPO | Admitting: Psychiatry

## 2022-04-07 ENCOUNTER — Encounter (HOSPITAL_COMMUNITY): Payer: Self-pay | Admitting: Psychiatry

## 2022-04-07 DIAGNOSIS — F411 Generalized anxiety disorder: Secondary | ICD-10-CM

## 2022-04-07 DIAGNOSIS — F1020 Alcohol dependence, uncomplicated: Secondary | ICD-10-CM | POA: Diagnosis not present

## 2022-04-07 DIAGNOSIS — F17210 Nicotine dependence, cigarettes, uncomplicated: Secondary | ICD-10-CM

## 2022-04-07 DIAGNOSIS — F5105 Insomnia due to other mental disorder: Secondary | ICD-10-CM | POA: Diagnosis not present

## 2022-04-07 DIAGNOSIS — F331 Major depressive disorder, recurrent, moderate: Secondary | ICD-10-CM | POA: Diagnosis not present

## 2022-04-07 DIAGNOSIS — F99 Mental disorder, not otherwise specified: Secondary | ICD-10-CM | POA: Diagnosis not present

## 2022-04-07 DIAGNOSIS — F17200 Nicotine dependence, unspecified, uncomplicated: Secondary | ICD-10-CM

## 2022-04-07 MED ORDER — TRAZODONE HCL 100 MG PO TABS: 200.0000 mg | ORAL_TABLET | Freq: Every day | ORAL | 0 refills | Status: DC

## 2022-04-07 MED ORDER — VENLAFAXINE HCL ER 150 MG PO CP24: 150.0000 mg | ORAL_CAPSULE | Freq: Every day | ORAL | 0 refills | Status: DC

## 2022-04-07 MED ORDER — BUPROPION HCL ER (SR) 150 MG PO TB12: 150.0000 mg | ORAL_TABLET | Freq: Two times a day (BID) | ORAL | 0 refills | Status: DC

## 2022-04-07 MED ORDER — BUSPIRONE HCL 15 MG PO TABS: 15.0000 mg | ORAL_TABLET | Freq: Three times a day (TID) | ORAL | 0 refills | Status: DC

## 2022-04-07 NOTE — Progress Notes (Signed)
Virtual Visit via Video Note  I connected with Kendra Ball on 04/07/22 at 10:30 AM EST by a video enabled telemedicine application and verified that I am speaking with the correct person using two identifiers.  Location: Patient: home Provider: office   I discussed the limitations of evaluation and management by telemedicine and the availability of in person appointments. The patient expressed understanding and agreed to proceed.  History of Present Illness: Kendra Ball shares she is doing well. "I'm really in a good place except my runs all the time". Sometimes it makes her tired and she can't get things done. This happens 3-4x/week and can last from hours to days. Some days she just makes plans over and over thru out the time. Her mind doesn't race when she is busy.  It started about 1 months ago.  She denies depression, anhedonia and isolation. Kendra Ball denies anxiety and states she is not worried about anything. She denies SI/HI. Kendra Ball denies any alcohol use. Her sleep is good and she gets about 6-7 hrs. She rarely naps during the day. She denies any new stressors or concerns and can't figure out what has changed.    Observations/Objective: Psychiatric Specialty Exam: ROS  There were no vitals taken for this visit.There is no height or weight on file to calculate BMI.  General Appearance: Casual  Eye Contact:  Good  Speech:  Clear and Coherent and Normal Rate  Volume:  Normal  Mood:  Anxious  Affect:  Full Range  Thought Process:  Goal Directed, Linear, and Descriptions of Associations: Intact  Orientation:  Full (Time, Place, and Person)  Thought Content:  Logical  Suicidal Thoughts:  No  Homicidal Thoughts:  No  Memory:  Immediate;   Good  Judgement:  Good  Insight:  Good  Psychomotor Activity:  Normal  Concentration:  Concentration: Good  Recall:  Good  Fund of Knowledge:  Good  Language:  Good  Akathisia:  No  Handed:  Right  AIMS (if indicated):     Assets:   Communication Skills Desire for Improvement Financial Resources/Insurance Housing Leisure Time Resilience Social Support Talents/Skills Transportation Vocational/Educational  ADL's:  Intact  Cognition:  WNL  Sleep:        Assessment and Plan:     04/07/2022   10:47 AM 12/01/2021    2:42 PM 10/14/2021    2:21 PM 09/17/2021    1:21 PM 05/06/2021    4:43 PM  Depression screen PHQ 2/9  Decreased Interest 0 0 0 0 0  Down, Depressed, Hopeless 0 0 1 0 1  PHQ - 2 Score 0 0 1 0 1  Altered sleeping    0   Tired, decreased energy    1   Change in appetite    1   Feeling bad or failure about yourself     0   Trouble concentrating    0   Moving slowly or fidgety/restless    0   Suicidal thoughts    0   PHQ-9 Score    2     Flowsheet Row Video Visit from 04/07/2022 in [facility] Video Visit from 10/14/2021 in [facility] Video Visit from 05/06/2021 in [place] No Risk No Risk No Risk         Pt is aware that these meds carry a teratogenic risk. Pt will discuss plan of action if she does or plans to become pregnant  in the future.  Status of current problems: increased anxiety, stable depression  Meds: increase Buspar '15mg'$  TID 1. GAD (generalized anxiety disorder) - busPIRone (BUSPAR) 15 MG tablet; Take 1 tablet (15 mg total) by mouth 3 (three) times daily.  Dispense: 270 tablet; Refill: 0 - venlafaxine XR (EFFEXOR-XR) 150 MG 24 hr capsule; Take 1 capsule (150 mg total) by mouth daily with breakfast.  Dispense: 90 capsule; Refill: 0  2. MDD (major depressive disorder), recurrent episode, moderate (HCC) - buPROPion (WELLBUTRIN SR) 150 MG 12 hr tablet; Take 1 tablet (150 mg total) by mouth 2 (two) times daily.  Dispense: 180 tablet; Refill: 0 - busPIRone (BUSPAR) 15 MG tablet; Take 1 tablet (15 mg total) by mouth 3 (three) times daily.   Dispense: 270 tablet; Refill: 0 - venlafaxine XR (EFFEXOR-XR) 150 MG 24 hr capsule; Take 1 capsule (150 mg total) by mouth daily with breakfast.  Dispense: 90 capsule; Refill: 0 - traZODone (DESYREL) 100 MG tablet; Take 2 tablets (200 mg total) by mouth at bedtime.  Dispense: 180 tablet; Refill: 0  3. Insomnia due to other mental disorder - traZODone (DESYREL) 100 MG tablet; Take 2 tablets (200 mg total) by mouth at bedtime.  Dispense: 180 tablet; Refill: 0  4. Nicotine use disorder - buPROPion (WELLBUTRIN SR) 150 MG 12 hr tablet; Take 1 tablet (150 mg total) by mouth 2 (two) times daily.  Dispense: 180 tablet; Refill: 0  5. Alcohol use disorder, moderate, dependence (HCC) - busPIRone (BUSPAR) 15 MG tablet; Take 1 tablet (15 mg total) by mouth 3 (three) times daily.  Dispense: 270 tablet; Refill: 0 - traZODone (DESYREL) 100 MG tablet; Take 2 tablets (200 mg total) by mouth at bedtime.  Dispense: 180 tablet; Refill: 0     Labs: none    Therapy: brief supportive therapy provided. Discussed psychosocial stressors in detail.   Discussed use of deep breathing and guided meditations to help calm anxiety   Collaboration of Care: Other none  Patient/Guardian was advised Release of Information must be obtained prior to any record release in order to collaborate their care with an outside provider. Patient/Guardian was advised if they have not already done so to contact the registration department to sign all necessary forms in order for [place] to release information regarding their care.   Consent: Patient/Guardian gives verbal consent for treatment and assignment of benefits for services provided during this visit. Patient/Guardian expressed understanding and agreed to proceed.        Follow Up Instructions: Follow up in 2-3 months or sooner if needed    I discussed the assessment and treatment plan with the patient. The patient was provided an opportunity to ask questions and all were  answered. The patient agreed with the plan and demonstrated an understanding of the instructions.   The patient was advised to call back or seek an in-person evaluation if the symptoms worsen or if the condition fails to improve as anticipated.  I provided 13 minutes of non-face-to-face time during this encounter.   [name], MD

## 2022-04-08 ENCOUNTER — Other Ambulatory Visit (HOSPITAL_COMMUNITY): Payer: Self-pay | Admitting: Psychiatry

## 2022-04-08 DIAGNOSIS — F411 Generalized anxiety disorder: Secondary | ICD-10-CM

## 2022-04-08 DIAGNOSIS — F1020 Alcohol dependence, uncomplicated: Secondary | ICD-10-CM

## 2022-04-08 DIAGNOSIS — F331 Major depressive disorder, recurrent, moderate: Secondary | ICD-10-CM

## 2022-04-11 ENCOUNTER — Ambulatory Visit
Admission: RE | Admit: 2022-04-11 | Discharge: 2022-04-11 | Disposition: A | Discharge status: Home / Self Care | Payer: Medicare PPO | Source: Ambulatory Visit | Attending: Family Medicine | Admitting: Family Medicine

## 2022-04-11 ENCOUNTER — Ambulatory Visit: Payer: Medicare PPO

## 2022-04-11 DIAGNOSIS — N6459 Other signs and symptoms in breast: Secondary | ICD-10-CM

## 2022-04-11 DIAGNOSIS — R928 Other abnormal and inconclusive findings on diagnostic imaging of breast: Secondary | ICD-10-CM | POA: Diagnosis not present

## 2022-05-11 NOTE — Progress Notes (Signed)
Kendra Ball , 25-May-1945, 77 y.o., female MRN: [id number] Patient Care Team    Relationship Specialty Notifications Start End  Ma Hillock, DO PCP - General Family Medicine  12/30/14   Juanita Craver, MD Consulting Physician Gastroenterology  05/09/14   Brandon Melnick, [place] (Inactive)  Psychology  10/16/15   [name], MD  Psychiatry  12/12/16   Ladell Pier, MD Consulting Physician Oncology  04/23/20   Park Liter, MD Consulting Physician Cardiology  05/12/22     Chief Complaint  Patient presents with   Shortness of Breath    6 months     Subjective: Pt presents for an OV with complaints of shortness of breath with exertion for 6 months  duration.  She reports she walks the neighborhood about 3 times a week, and over the last 6 months sometimes she can do it without being short of breath and sometimes she cannot.  She does have shortness of breath on occasions indoors, when walking room to room, but not all the time.  She denies any dizziness, syncope, chest pain or lower extremity edema associated with shortness of breath.  She is a smoker, has had mild COPD/asthma overlay.  She reports compliance with Wixela twice daily and albuterol as needed.  She states she has tried to use the albuterol before the walk and it does not seem to change the outcome.  Has a significant cardiac history with abnormal Lexiscan, evidence of prior MI.  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  06/03/2020 echo: 1. Left ventricular ejection fraction, by estimation, is 50 to 55%. The  left ventricle has low normal function. The left ventricle demonstrates  moderate hypokinesis in the basal to mid inferior wall and the remaining  myocardial walls segments with  global hypokinesis. Left ventricular diastolic parameters are  indeterminate.   2. Right ventricular systolic function is normal. The right ventricular  size is normal.    3. The mitral valve is normal in structure. Mild mitral valve  regurgitation. No evidence of mitral stenosis.   4. The aortic valve is normal in structure. Aortic valve regurgitation is  not visualized. No aortic stenosis is present.   5. The inferior vena cava is normal in size with greater than 50%  respiratory variability, suggesting right atrial pressure of 3 mmHg.   05/20/2020 lexiscan: Nuclear stress EF: 51%. There was no ST segment deviation noted during stress. No T wave inversion was noted during stress. There is a small, partially reversible perfusion defect of moderate severity present in the apical anterior location. Findings consistent with prior myocardial infarction with peri-infarct ischemia. This is a low risk study. The left ventricular ejection fraction is mildly decreased (45-54%).   01/10/2022 carotid studies: Right Carotid: Velocities in the right ICA are consistent with a 60-79%                 stenosis.  Left Carotid: Velocities in the left ICA are consistent with a 40-59%  stenosis.     04/07/2022   10:47 AM 12/01/2021    2:42 PM 10/14/2021    2:21 PM 09/17/2021    1:21 PM 05/06/2021    4:43 PM  Depression screen PHQ 2/9  Decreased Interest  0  0   Down, Depressed, Hopeless  0  0   PHQ - 2 Score  0  0   Altered sleeping    0  Tired, decreased energy    1   Change in appetite    1   Feeling bad or failure about yourself     0   Trouble concentrating    0   Moving slowly or fidgety/restless    0   Suicidal thoughts    0   PHQ-9 Score    2      Information is confidential and restricted. Go to Review Flowsheets to unlock data.    Allergies  Allergen Reactions   Morphine Sulfate Swelling and Other (See Comments)    Swelling around injection area   Social History   Social History Narrative   Ms. Gaulin lives in [place] with her husband. She has 3 grown children and several grand children. She has joint custody of 3 of her grand children as her  daughter has bipolar disorder and has needed assistance with children in past.   1-2 cups coffee, 3-6 cups tea per day.   Right-handed.   Past Medical History:  Diagnosis Date   Abnormal mammogram 10/01/2013   Diagnostic [facility] & [place] confirm cyst & duct ectasia of L breast. 08/2013.    Abnormal stress test 05/27/2020   Abnormal weight gain 05/05/2020   Adenopathy    right supraclavicular   Anxiety    Aortic atherosclerosis ([place]) 05/05/2020   Arthritis    At high risk for falls 01/29/2019   Carotid bruit 05/08/2020   Chronic diarrhea 10/13/2015   COPD (chronic obstructive pulmonary disease) (HCC)    COPD (chronic obstructive pulmonary disease) with chronic bronchitis 02/13/2015   Coronary atherosclerosis 10/22/2015   DEPRESSION 08/26/2008   Diffuse large B cell lymphoma ([place]) 11/23/2017   Dyslipidemia 10/03/2013   31% reduction LDL after 6 mos of Simvastatin 10 mg qd. RF: smoker, fam Hx, sedentary HDL is high!!    Dyspnea on exertion 05/08/2020   Essential hypertension 05/22/2014   Family history of malignant neoplasm of gastrointestinal tract 05/05/2020   Family history of malignant neoplasm of gastrointestinal tract 05/05/2020   Family history of premature coronary heart disease 08/13/2013   Father, brother, sister Personal risk factors: smoker, female over 26, fam Hx, sedentary, thyroid disease, dyslipidemia, BMI over 25.    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     Insomnia 05/05/2015   Iron deficiency anemia 05/05/2020   Lymphoma ([place]) 2019   B-cell   MDD (major depressive disorder), recurrent episode, moderate ([place]) 01/30/2014   [facility] 05/2015  08/18/2015:  Effexor XR '225mg'$  po qD for depression and anxiety Wellbutrin SR to '200mg'$  po BID for depression   Xanax 0.'5mg'$  po BID and '1mg'$  po qHS prn anxiety       Routine PRN Medications:  Yes    Consultations: encouraged to f/up with PCP as needed, encouraged to start individual therapy   Safety Concerns:  Pt denies SI and is at an acute low risk for suicide.Patient told to call clinic i   Multiple fractures 01/29/2019   Obesity    Osteoporosis    Paresthesia 08/06/2019   Port-A-Cath in place 12/27/2017   Pulmonary nodule seen on imaging  study 02/13/2015   Smoker 09/14/2015   Substance abuse ([place])    inpatient and outpatient tx for substance abuse   TOBACCO USER 12/25/2009   Tremor 05/23/2019   Past Surgical History:  Procedure Laterality Date   AXILLARY LYMPH NODE BIOPSY Right 06/22/2017   Procedure: RIGHT AXILLARY LYMPH NODE BIOPSY;  Surgeon: [name], MD;  Location: [facility];  Service: General;  Laterality: Right;   BARIATRIC SURGERY  2006   CESAREAN SECTION     COLON RESECTION N/A 05/09/2014   Procedure: LAPAROSCOPIC HAND-ASSISTED EXTENDED RIGHT COLECTOMY, LAPAROSCOPIC LYSIS OF ADHESIONS, SPLENIC FLEXURE MOBILIZATION.;  Surgeon: [name], MD;  Location: [facility];  Service: General;  Laterality: N/A;   [place]     x2   FOREARM FRACTURE SURGERY     right   GASTRIC BYPASS  2006   IR IMAGING GUIDED PORT INSERTION  12/01/2017   IR REMOVAL TUN ACCESS W/ PORT W/O FL MOD SED  07/03/2018   MASS EXCISION Right 02/24/2017   Procedure: EXCISIONAL BIOPSY RIGHT SUPRA-CLAVICULAR NODE;  Surgeon: [name], MD;  Location: [facility];  Service: ENT;  Laterality: Right;   TONSILLECTOMY     Family History  Problem Relation Age of Onset   Cancer Mother        colon   Depression Mother    Alzheimer's disease Mother        Dementia   Heart disease Father 41   Heart disease Sister 79       stents, pacemaker   Depression Sister    Heart disease Brother 39        CABG, MI   Depression Brother    Obesity Daughter    Suicidality Daughter    Bipolar disorder Daughter    Obesity Son    Mental illness Daughter        bipolar   Obesity Daughter    Depression Sister    Dementia Paternal Aunt    Alcohol abuse Maternal Uncle    Colon cancer Maternal Aunt    Stomach cancer Maternal Grandfather    Allergies as of 05/12/2022       Reactions   Morphine Sulfate Swelling, Other (See Comments)   Swelling around injection area        Medication List        Accurate as of May 12, 2022 10:57 AM. If you have any questions, ask your nurse or doctor.          albuterol 108 (90 Base) MCG/ACT inhaler Commonly known as: VENTOLIN HFA Inhale 2 puffs into the lungs every 6 (six) hours as needed for wheezing or shortness of breath.   aspirin EC 81 MG tablet Take 1 tablet (81 mg total) by mouth daily. Swallow whole.   buPROPion 150 MG 12 hr tablet Commonly known as: WELLBUTRIN SR Take 1 tablet (150 mg total) by mouth 2 (two) times daily.   busPIRone 15 MG tablet Commonly known as: BUSPAR Take 1 tablet (15 mg total) by mouth 3 (three) times daily.   clotrimazole 1 % cream Commonly known as: Clotrimazole Anti-Fungal Apply 1 application topically 2 (two) times daily.   Cyanocobalamin 2500 MCG Subl 1 tab sublingual daily   denosumab 60 MG/ML Sosy injection Commonly known as: PROLIA Inject 60 mg into the skin every 6 (six) months.   ferrous fumarate 325 (106 Fe) MG Tabs tablet Commonly known as: HEMOCYTE - 106 mg FE Take 1 tablet by mouth daily. Reported  on 10/06/2015   fluticasone-salmeterol 250-50 MCG/ACT Aepb Commonly known as: Wixela Inhub Inhale 1 puff into the lungs in the morning and at bedtime.   levothyroxine 100 MCG tablet Commonly known as: SYNTHROID Take 1 tablet (100 mcg total) by mouth daily before breakfast.   Magnesium 250 MG Tabs Take 250 mg by mouth daily.   ranolazine 500 MG 12 hr tablet Commonly known as:  RANEXA Take 1 tablet (500 mg total) by mouth 2 (two) times daily.   rosuvastatin 10 MG tablet Commonly known as: CRESTOR Take 1 tablet (10 mg total) by mouth daily.   traZODone 100 MG tablet Commonly known as: DESYREL Take 2 tablets (200 mg total) by mouth at bedtime.   venlafaxine XR 150 MG 24 hr capsule Commonly known as: EFFEXOR-XR Take 1 capsule (150 mg total) by mouth daily with breakfast.        All past medical history, surgical history, allergies, family history, immunizations andmedications were updated in the EMR today and reviewed under the history and medication portions of their EMR.     ROS Negative, with the exception of above mentioned in HPI   Objective:  BP 133/81   Pulse 70   Temp 98.1 F (36.7 C)   Wt 176 lb 12.8 oz (80.2 kg)   SpO2 96%   BMI 30.83 kg/m  Body mass index is 30.83 kg/m. Physical Exam Vitals and nursing note reviewed.  Constitutional:      General: She is not in acute distress.    Appearance: Normal appearance. She is obese. She is not ill-appearing, toxic-appearing or diaphoretic.  HENT:     Head: Normocephalic and atraumatic.     Nose: No congestion or rhinorrhea.     Mouth/Throat:     Mouth: Mucous membranes are moist.     Pharynx: No oropharyngeal exudate or posterior oropharyngeal erythema.  Eyes:     General: No scleral icterus.       Right eye: No discharge.        Left eye: No discharge.     Extraocular Movements: Extraocular movements intact.     Conjunctiva/sclera: Conjunctivae normal.     Pupils: Pupils are equal, round, and reactive to light.  Cardiovascular:     Rate and Rhythm: Normal rate and regular rhythm.     Heart sounds: No murmur heard. Pulmonary:     Effort: Pulmonary effort is normal. No respiratory distress.     Breath sounds: Rhonchi (Very mild rhonchi right, cleared with cough) present. No wheezing or rales.  Musculoskeletal:     Cervical back: Neck supple.     Right lower leg: No edema.      Left lower leg: No edema.  Lymphadenopathy:     Cervical: No cervical adenopathy.  Skin:    General: Skin is warm.     Findings: No rash.  Neurological:     Mental Status: She is alert and oriented to person, place, and time. Mental status is at baseline.     Motor: No weakness.     Coordination: Coordination normal.     Gait: Gait normal.  Psychiatric:        Mood and Affect: Mood normal.        Behavior: Behavior normal.        Thought Content: Thought content normal.        Judgment: Judgment normal.    No results found. No results found. No results found for this or any previous visit (from the past  24 hour(s)).  Assessment/Plan: BRAXTYN DORFF is a 77 y.o. female present for OV for  Hypothyroidism due to acquired atrophy of thyroid Ensure her thyroid supplement is adequate - TSH  Atherosclerosis/dyslipidemia/family history of premature coronary heart disease/abnormal stress test -Cannot rule out other causes, such as cardiac, although symptoms are not classic for cardiac related dyspnea.  Low threshold to refer back to her cardiology team for further eval.  iron deficiency anemia - CBC w/Diff - IBC + Ferritin Is supplementing with iron currently, denies any rectal bleeding  Dyspnea on exertion/COPD (chronic obstructive pulmonary disease) with chronic bronchitis - DG Chest 2 View; Future -CBC, iron panel, CMP and TSH collected today to rule out causes of dyspnea. -By exam suspect COPD/asthma as probable cause.  Possibly could be related to change in weather during the winter/fall.   -Cannot rule out other causes, such as cardiac, although symptoms are not classic for cardiac related dyspnea.  Low threshold to refer back to her cardiology team for further eval. -Discussed starting a nighttime antihistamine such as Zyrtec or Xyzal, and changing her current inhaler dose.  Will await laboratory results before moving forward with any medication changes.    Reviewed  expectations re: course of current medical issues. Discussed self-management of symptoms. Outlined signs and symptoms indicating need for more acute intervention. Patient verbalized understanding and all questions were answered. Patient received an After-Visit Summary.    No orders of the defined types were placed in this encounter.  No orders of the defined types were placed in this encounter.  Referral Orders  No referral(s) requested today     Note is dictated utilizing voice recognition software. Although note has been proof read prior to signing, occasional typographical errors still can be missed. If any questions arise, please do not hesitate to call for verification.   electronically signed by:  Howard Pouch, DO  [place]

## 2022-05-12 ENCOUNTER — Ambulatory Visit (INDEPENDENT_AMBULATORY_CARE_PROVIDER_SITE_OTHER): Payer: Medicare PPO | Admitting: Family Medicine

## 2022-05-12 ENCOUNTER — Ambulatory Visit (HOSPITAL_BASED_OUTPATIENT_CLINIC_OR_DEPARTMENT_OTHER)
Admission: RE | Admit: 2022-05-12 | Discharge: 2022-05-12 | Disposition: A | Discharge status: Home / Self Care | Payer: Medicare PPO | Source: Ambulatory Visit | Attending: Family Medicine | Admitting: Family Medicine

## 2022-05-12 ENCOUNTER — Encounter: Payer: Self-pay | Admitting: Family Medicine

## 2022-05-12 VITALS — BP 133/81 | HR 70 | Temp 98.1°F | Wt 176.8 lb

## 2022-05-12 DIAGNOSIS — R9439 Abnormal result of other cardiovascular function study: Secondary | ICD-10-CM | POA: Diagnosis not present

## 2022-05-12 DIAGNOSIS — R0609 Other forms of dyspnea: Secondary | ICD-10-CM | POA: Diagnosis not present

## 2022-05-12 DIAGNOSIS — J4489 Other specified chronic obstructive pulmonary disease: Secondary | ICD-10-CM

## 2022-05-12 DIAGNOSIS — E785 Hyperlipidemia, unspecified: Secondary | ICD-10-CM

## 2022-05-12 DIAGNOSIS — R0602 Shortness of breath: Secondary | ICD-10-CM | POA: Diagnosis not present

## 2022-05-12 DIAGNOSIS — D508 Other iron deficiency anemias: Secondary | ICD-10-CM

## 2022-05-12 DIAGNOSIS — E034 Atrophy of thyroid (acquired): Secondary | ICD-10-CM

## 2022-05-12 DIAGNOSIS — I2510 Atherosclerotic heart disease of native coronary artery without angina pectoris: Secondary | ICD-10-CM | POA: Diagnosis not present

## 2022-05-12 DIAGNOSIS — Z8249 Family history of ischemic heart disease and other diseases of the circulatory system: Secondary | ICD-10-CM

## 2022-05-12 DIAGNOSIS — I700 Atherosclerosis of aorta: Secondary | ICD-10-CM | POA: Diagnosis not present

## 2022-05-12 LAB — CBC WITH DIFFERENTIAL/PLATELET
Basophils Absolute: 0.1 10*3/uL (ref 0.0–0.1)
Basophils Relative: 1.2 % (ref 0.0–3.0)
Eosinophils Absolute: 0.1 10*3/uL (ref 0.0–0.7)
Eosinophils Relative: 1.4 % (ref 0.0–5.0)
HCT: 42.2 % (ref 36.0–46.0)
Hemoglobin: 14.1 g/dL (ref 12.0–15.0)
Lymphocytes Relative: 20.8 % (ref 12.0–46.0)
Lymphs Abs: 1.2 10*3/uL (ref 0.7–4.0)
MCHC: 33.5 g/dL (ref 30.0–36.0)
MCV: 102.4 fl — ABNORMAL HIGH (ref 78.0–100.0)
Monocytes Absolute: 0.5 10*3/uL (ref 0.1–1.0)
Monocytes Relative: 8.3 % (ref 3.0–12.0)
Neutro Abs: 4 10*3/uL (ref 1.4–7.7)
Neutrophils Relative %: 68.3 % (ref 43.0–77.0)
Platelets: 235 10*3/uL (ref 150.0–400.0)
RBC: 4.12 Mil/uL (ref 3.87–5.11)
RDW: 13.4 % (ref 11.5–15.5)
WBC: 5.9 10*3/uL (ref 4.0–10.5)

## 2022-05-12 LAB — COMPREHENSIVE METABOLIC PANEL
ALT: 17 U/L (ref 0–35)
AST: 20 U/L (ref 0–37)
Albumin: 3.9 g/dL (ref 3.5–5.2)
Alkaline Phosphatase: 39 U/L (ref 39–117)
BUN: 10 mg/dL (ref 6–23)
CO2: 28 mEq/L (ref 19–32)
Calcium: 8.9 mg/dL (ref 8.4–10.5)
Chloride: 107 mEq/L (ref 96–112)
Creatinine, Ser: 0.73 mg/dL (ref 0.40–1.20)
GFR: 80.14 mL/min (ref >60.00)
Glucose, Bld: 95 mg/dL (ref 70–99)
Potassium: 4.1 mEq/L (ref 3.5–5.1)
Sodium: 143 mEq/L (ref 135–145)
Total Bilirubin: 0.6 mg/dL (ref 0.2–1.2)
Total Protein: 6.1 g/dL (ref 6.0–8.3)

## 2022-05-12 LAB — IBC + FERRITIN
Ferritin: 87.7 ng/mL (ref 10.0–291.0)
Iron: 160 ug/dL — ABNORMAL HIGH (ref 42–145)
Saturation Ratios: 48 % (ref 20.0–50.0)
TIBC: 333.2 ug/dL (ref 250.0–450.0)
Transferrin: 238 mg/dL (ref 212.0–360.0)

## 2022-05-12 LAB — TSH: TSH: 0.65 u[IU]/mL (ref 0.35–5.50)

## 2022-05-12 MED ORDER — CLOTRIMAZOLE 1 % EX CREA
1.0000 | TOPICAL_CREAM | Freq: Two times a day (BID) | CUTANEOUS | 11 refills | Status: DC
Start: 2022-05-12 — End: 2022-11-25

## 2022-05-12 NOTE — Patient Instructions (Addendum)
Return if symptoms worsen or fail to improve.        Great to see you today.    If labs were collected, we will inform you of lab results once received either by echart message or telephone call.   - echart message- for normal results that have been seen by the patient already.   - telephone call: abnormal results or if patient has not viewed results in their echart.

## 2022-05-13 ENCOUNTER — Telehealth: Payer: Self-pay | Admitting: Family Medicine

## 2022-05-13 MED ORDER — BUDESONIDE-FORMOTEROL FUMARATE 160-4.5 MCG/ACT IN AERO: 2.0000 | INHALATION_SPRAY | Freq: Two times a day (BID) | RESPIRATORY_TRACT | 3 refills | Status: DC

## 2022-05-13 NOTE — Telephone Encounter (Signed)
Please call patient: Her chest x-ray does not show any acute process responsible for her shortness of breath Her iron levels are just mildly above normal.  She can decrease her iron supplementation by half, or take every other day etc. Liver function, kidney function and electrolyte levels are all normal. Thyroid function is normal. Blood cell counts are normal.  No signs of anemia or infection. I would encourage her to start an over-the-counter Zyrtec before bed nightly. I have called in Symbicort for her to use 2 puffs daily in place of her current Wixela inhaler. Before she does her exercising/walks outside encouraged her to take 2 puffs of her albuterol inhaler about 15 minutes before starting the exercise.    If she does not see improvement in her symptoms within 3 to 4 weeks, or if she is worsening then I would encourage her to follow-up with her cardiologist for further evaluation of possible causes.

## 2022-05-13 NOTE — Telephone Encounter (Signed)
Spoke with patient regarding results/recommendations.  

## 2022-05-19 ENCOUNTER — Other Ambulatory Visit: Payer: Self-pay | Admitting: Cardiology

## 2022-05-28 ENCOUNTER — Other Ambulatory Visit: Payer: Self-pay | Admitting: Cardiology

## 2022-05-30 NOTE — Telephone Encounter (Signed)
Rx refill sent to pharmacy. 

## 2022-06-09 ENCOUNTER — Telehealth (HOSPITAL_BASED_OUTPATIENT_CLINIC_OR_DEPARTMENT_OTHER): Payer: Medicare PPO | Admitting: Psychiatry

## 2022-06-09 DIAGNOSIS — F1020 Alcohol dependence, uncomplicated: Secondary | ICD-10-CM | POA: Diagnosis not present

## 2022-06-09 DIAGNOSIS — F331 Major depressive disorder, recurrent, moderate: Secondary | ICD-10-CM | POA: Diagnosis not present

## 2022-06-09 DIAGNOSIS — F5105 Insomnia due to other mental disorder: Secondary | ICD-10-CM

## 2022-06-09 DIAGNOSIS — F17210 Nicotine dependence, cigarettes, uncomplicated: Secondary | ICD-10-CM

## 2022-06-09 DIAGNOSIS — F17200 Nicotine dependence, unspecified, uncomplicated: Secondary | ICD-10-CM

## 2022-06-09 DIAGNOSIS — F99 Mental disorder, not otherwise specified: Secondary | ICD-10-CM

## 2022-06-09 DIAGNOSIS — F411 Generalized anxiety disorder: Secondary | ICD-10-CM | POA: Diagnosis not present

## 2022-06-09 MED ORDER — BUSPIRONE HCL 15 MG PO TABS: 15.0000 mg | ORAL_TABLET | Freq: Three times a day (TID) | ORAL | 0 refills | Status: DC

## 2022-06-09 MED ORDER — BUPROPION HCL ER (SR) 150 MG PO TB12: 150.0000 mg | ORAL_TABLET | Freq: Two times a day (BID) | ORAL | 0 refills | Status: DC

## 2022-06-09 MED ORDER — VENLAFAXINE HCL ER 150 MG PO CP24: 150.0000 mg | ORAL_CAPSULE | Freq: Every day | ORAL | 0 refills | Status: DC

## 2022-06-09 MED ORDER — TRAZODONE HCL 100 MG PO TABS: 200.0000 mg | ORAL_TABLET | Freq: Every day | ORAL | 0 refills | Status: DC

## 2022-06-09 NOTE — Progress Notes (Signed)
Virtual Visit via Video Note  I connected with Kendra Ball on 06/09/22 at 10:00 AM EST by  a video enabled telemedicine application and verified that I am speaking with the correct person using two identifiers.  Location: Patient: home Provider: office   I discussed the limitations of evaluation and management by telemedicine and the availability of in person appointments. The patient expressed understanding and agreed to proceed.  History of Present Illness: "I am good". Bellatrix had a good Christmas. She is close with her family and spends a lot time talking and meeting with them. She is sleeping well and feels good when she wakes up. Her appetite is good. She denies depression, isolation and anhedonia. She can't recall the last time she was depressed. She denies SI/HI. Her anxiety has improved since the Buspar was increased in December. She feels the medications are effective and denies SE.    Observations/Objective: Psychiatric Specialty Exam: ROS  There were no vitals taken for this visit.There is no height or weight on file to calculate BMI.  General Appearance: Fairly Groomed and Neat  Eye Contact:  Good  Speech:  Clear and Coherent and Normal Rate  Volume:  Normal  Mood:  Euthymic  Affect:  Full Range  Thought Process:  Goal Directed, Linear, and Descriptions of Associations: Intact  Orientation:  Full (Time, Place, and Person)  Thought Content:  Logical  Suicidal Thoughts:  No  Homicidal Thoughts:  No  Memory:  Immediate;   Good  Judgement:  Good  Insight:  Good  Psychomotor Activity:  Normal  Concentration:  Concentration: Good  Recall:  Good  Fund of Knowledge:  Good  Language:  Good  Akathisia:  No  Handed:  Right  AIMS (if indicated):     Assets:  Communication Skills Desire for Improvement Financial Resources/Insurance Housing Leisure Time Resilience Social Support Talents/Skills Transportation Vocational/Educational  ADL's:  Intact  Cognition:   WNL  Sleep:        Assessment and Plan:     06/09/2022   10:18 AM 04/07/2022   10:47 AM 12/01/2021    2:42 PM 10/14/2021    2:21 PM 09/17/2021    1:21 PM  Depression screen PHQ 2/9  Decreased Interest 0 0 0 0 0  Down, Depressed, Hopeless 0 0 0 1 0  PHQ - 2 Score 0 0 0 1 0  Altered sleeping     0  Tired, decreased energy     1  Change in appetite     1  Feeling bad or failure about yourself      0  Trouble concentrating     0  Moving slowly or fidgety/restless     0  Suicidal thoughts     0  PHQ-9 Score     2    Flowsheet Row Video Visit from 06/09/2022 in [facility] Video Visit from 04/07/2022 in [facility] Video Visit from 10/14/2021 in [facility] No Risk No Risk No Risk          Pt is aware that these meds carry a teratogenic risk. Pt will discuss plan of action if she does or plans to become pregnant in the future.  Status of current problems: stable   Medication management with supportive therapy. Risks and benefits, side effects and alternative treatment options discussed with patient. Pt was given an opportunity to ask questions about medication, illness, and treatment. All  current psychiatric medications have been reviewed and discussed with the patient and adjusted as clinically appropriate.  Pt verbalized understanding and verbal consent obtained for treatment.  Meds:  1. Nicotine use disorder - buPROPion (WELLBUTRIN SR) 150 MG 12 hr tablet; Take 1 tablet (150 mg total) by mouth 2 (two) times daily.  Dispense: 180 tablet; Refill: 0  2. MDD (major depressive disorder), recurrent episode, moderate (HCC) - buPROPion (WELLBUTRIN SR) 150 MG 12 hr tablet; Take 1 tablet (150 mg total) by mouth 2 (two) times daily.  Dispense: 180 tablet; Refill: 0 - busPIRone (BUSPAR) 15 MG tablet; Take 1 tablet (15 mg total) by mouth 3 (three) times  daily.  Dispense: 270 tablet; Refill: 0 - traZODone (DESYREL) 100 MG tablet; Take 2 tablets (200 mg total) by mouth at bedtime.  Dispense: 180 tablet; Refill: 0 - venlafaxine XR (EFFEXOR-XR) 150 MG 24 hr capsule; Take 1 capsule (150 mg total) by mouth daily with breakfast.  Dispense: 90 capsule; Refill: 0  3. GAD (generalized anxiety disorder) - busPIRone (BUSPAR) 15 MG tablet; Take 1 tablet (15 mg total) by mouth 3 (three) times daily.  Dispense: 270 tablet; Refill: 0 - venlafaxine XR (EFFEXOR-XR) 150 MG 24 hr capsule; Take 1 capsule (150 mg total) by mouth daily with breakfast.  Dispense: 90 capsule; Refill: 0  4. Alcohol use disorder, moderate, dependence (HCC) - busPIRone (BUSPAR) 15 MG tablet; Take 1 tablet (15 mg total) by mouth 3 (three) times daily.  Dispense: 270 tablet; Refill: 0 - traZODone (DESYREL) 100 MG tablet; Take 2 tablets (200 mg total) by mouth at bedtime.  Dispense: 180 tablet; Refill: 0  5. Insomnia due to other mental disorder - traZODone (DESYREL) 100 MG tablet; Take 2 tablets (200 mg total) by mouth at bedtime.  Dispense: 180 tablet; Refill: 0    - advised to check BP once/week due to meds  Labs: none    Therapy: brief supportive therapy provided. Discussed psychosocial stressors in detail.     Collaboration of Care: Other n/a  Patient/Guardian was advised Release of Information must be obtained prior to any record release in order to collaborate their care with an outside provider. Patient/Guardian was advised if they have not already done so to contact the registration department to sign all necessary forms in order for [place] to release information regarding their care.   Consent: Patient/Guardian gives verbal consent for treatment and assignment of benefits for services provided during this visit. Patient/Guardian expressed understanding and agreed to proceed.   Follow Up Instructions: Follow up in 3 months  or sooner if needed    I discussed the assessment  and treatment plan with the patient. The patient was provided an opportunity to ask questions and all were answered. The patient agreed with the plan and demonstrated an understanding of the instructions.   The patient was advised to call back or seek an in-person evaluation if the symptoms worsen or if the condition fails to improve as anticipated.  I provided 12 minutes of non-face-to-face time during this encounter.   [name], MD

## 2022-06-13 ENCOUNTER — Other Ambulatory Visit: Payer: Self-pay | Admitting: Family Medicine

## 2022-06-15 ENCOUNTER — Other Ambulatory Visit (HOSPITAL_COMMUNITY): Payer: Self-pay

## 2022-06-15 ENCOUNTER — Encounter: Payer: Self-pay | Admitting: Cardiology

## 2022-06-15 ENCOUNTER — Ambulatory Visit: Payer: Medicare PPO | Attending: Cardiology | Admitting: Cardiology

## 2022-06-15 VITALS — BP 114/60 | HR 68 | Ht 63.5 in | Wt 177.0 lb

## 2022-06-15 DIAGNOSIS — F17200 Nicotine dependence, unspecified, uncomplicated: Secondary | ICD-10-CM | POA: Diagnosis not present

## 2022-06-15 DIAGNOSIS — I739 Peripheral vascular disease, unspecified: Secondary | ICD-10-CM

## 2022-06-15 DIAGNOSIS — I2510 Atherosclerotic heart disease of native coronary artery without angina pectoris: Secondary | ICD-10-CM | POA: Diagnosis not present

## 2022-06-15 DIAGNOSIS — J4489 Other specified chronic obstructive pulmonary disease: Secondary | ICD-10-CM

## 2022-06-15 DIAGNOSIS — E785 Hyperlipidemia, unspecified: Secondary | ICD-10-CM | POA: Diagnosis not present

## 2022-06-15 MED ORDER — SYMBICORT 160-4.5 MCG/ACT IN AERO: 2.0000 | INHALATION_SPRAY | Freq: Two times a day (BID) | RESPIRATORY_TRACT | 12 refills | Status: DC

## 2022-06-15 NOTE — Patient Instructions (Addendum)
Medication Instructions:  Your physician recommends that you continue on your current medications as directed. Please refer to the Current Medication list given to you today.  *If you need a refill on your cardiac medications before your next appointment, please call your pharmacy*   Lab Work: [facility] recommends that you return for lab work in: when fasting You need to have labs done when you are fasting.  You can come Monday through Friday 8:00 am to 11:30 am and 1:00 to 4:00. You do not need to make an appointment as the order has already been placed. The labs you are going to have done are AST, ALT Lipids.    Testing/Procedures: Your physician has requested that you have a carotid duplex. This test is an ultrasound of the carotid arteries in your neck. It looks at blood flow through these arteries that supply the brain with blood. Allow one hour for this exam. There are no restrictions or special instructions.    Follow-Up: At [facility], you and your health needs are our priority.  As part of our continuing mission to provide you with exceptional heart care, we have created designated Provider Care Teams.  These Care Teams include your primary Cardiologist (physician) and Advanced Practice Providers (APPs -  Physician Assistants and Nurse Practitioners) who all work together to provide you with the care you need, when you need it.  We recommend signing up for the patient portal called "MyChart".  Sign up information is provided on this After Visit Summary.  MyChart is used to connect with patients for Virtual Visits (Telemedicine).  Patients are able to view lab/test results, encounter notes, upcoming appointments, etc.  Non-urgent messages can be sent to your provider as well.   To learn more about what you can do with MyChart, go to [email or web address].    Your next appointment:   6 month(s)  The format for your next appointment:   In  Person  Provider:   Jenne Campus, MD    Other Instructions NA

## 2022-06-15 NOTE — Telephone Encounter (Signed)
Per insurance, no PA required, needs to be run as brand name Symbicort with DAW-9, called [facility] and they are getting inhaler ready for the patient.

## 2022-06-15 NOTE — Telephone Encounter (Signed)
noted 

## 2022-06-15 NOTE — Telephone Encounter (Signed)
Changed to brand name Symbicort

## 2022-06-15 NOTE — Progress Notes (Signed)
Cardiology Office Note:    Date:  06/15/2022   ID:  Kendra Ball, DOB 01-12-1946, MRN [id number]  PCP:  Ma Hillock, DO  Cardiologist:  Jenne Campus, MD    Referring MD: Ma Hillock, DO   No chief complaint on file. I am doing fine but I do have shortness of breath  History of Present Illness:    Kendra Ball is a 77 y.o. female  with quite interesting past medical history. She did have a CT of her chest done showed some calcification of the coronary artery after that she had a stress test done which showed possibility of apical MI with peri-infarct ischemia, echocardiogram performed after that showed no evidence of anterior wall MI surprisingly shows some possibility of inferior wall MI very confusing story, eventually she end up having coronary CT angio which showed only mild disease in the LAD. She does have peripheral vascular disease in form of right carotic artery stenosis, she is a smoker and does have dyslipidemia and essential hypertension.  She comes today to months for follow-up.  She does complain of having a little more shortness of breath.  There was some changes to her medication for her lungs which seems to be improving the situation.  She denies have any chest pain tightness squeezing pressure burning chest no palpitations dizziness swelling of lower extremities.  Past Medical History:  Diagnosis Date   Abnormal mammogram 10/01/2013   Diagnostic [facility] & [place] confirm cyst & duct ectasia of L breast. 08/2013.    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 RF: smoker, fam Hx, sedentary HDL is high!!    Dyspnea on exertion 05/08/2020   Essential hypertension 05/22/2014   Family history of malignant neoplasm of gastrointestinal tract 05/05/2020   Family history of malignant neoplasm of gastrointestinal tract 05/05/2020   Family history of premature coronary heart disease 08/13/2013   Father, brother, sister Personal risk factors: smoker, female over 82, fam Hx, sedentary, thyroid disease, dyslipidemia, BMI over 25.    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     Insomnia 05/05/2015   Iron deficiency anemia 05/05/2020   Lymphoma ([place]) 2019   B-cell   MDD (major depressive disorder), recurrent episode, moderate ([place]) 01/30/2014   [facility] 05/2015 08/18/2015:  Effexor XR '225mg'$  po qD for depression and anxiety Wellbutrin SR to '200mg'$  po BID for depression   Xanax 0.'5mg'$  po BID and '1mg'$  po qHS prn anxiety       Routine PRN Medications:  Yes  Consultations: encouraged to f/up with PCP as needed, encouraged to start individual therapy   Safety Concerns:  Pt denies SI and is at an acute low risk for suicide.Patient told to call clinic i   Multiple fractures 01/29/2019   Obesity    Osteoporosis    Paresthesia 08/06/2019   Port-A-Cath in place 12/27/2017   Pulmonary nodule seen on imaging study 02/13/2015   Smoker 09/14/2015   Substance abuse ([place])    inpatient and outpatient tx for substance abuse   TOBACCO USER 12/25/2009   Tremor 05/23/2019    Past Surgical History:  Procedure  Laterality Date   AXILLARY LYMPH NODE BIOPSY Right 06/22/2017   Procedure: RIGHT AXILLARY LYMPH NODE BIOPSY;  Surgeon: [name], MD;  Location: [facility];  Service: General;  Laterality: Right;   BARIATRIC SURGERY  2006   CESAREAN SECTION     COLON RESECTION N/A 05/09/2014   Procedure: LAPAROSCOPIC HAND-ASSISTED EXTENDED RIGHT COLECTOMY, LAPAROSCOPIC LYSIS OF ADHESIONS, SPLENIC FLEXURE MOBILIZATION.;  Surgeon: [name], MD;  Location: [facility];  Service: General;  Laterality: N/A;   DILATION AND CURETTAGE OF UTERUS     x2   FOREARM FRACTURE SURGERY     right   GASTRIC BYPASS  2006   IR IMAGING GUIDED PORT INSERTION  12/01/2017   IR REMOVAL TUN ACCESS W/ PORT W/O FL MOD SED  07/03/2018   MASS EXCISION Right 02/24/2017   Procedure: EXCISIONAL BIOPSY RIGHT SUPRA-CLAVICULAR NODE;  Surgeon: [name], MD;  Location: [facility];  Service: ENT;  Laterality: Right;   TONSILLECTOMY      Current Medications: Current Meds  Medication Sig   albuterol (VENTOLIN HFA) 108 (90 Base) MCG/ACT inhaler Inhale 2 puffs into the lungs every 6 (six) hours as needed for wheezing or shortness of breath.   aspirin EC 81 MG tablet Take 1 tablet (81 mg total) by mouth daily. Swallow whole.   budesonide-formoterol (SYMBICORT) 160-4.5 MCG/ACT inhaler Inhale 2 puffs into the lungs 2 (two) times daily.   buPROPion (WELLBUTRIN SR) 150 MG 12 hr tablet Take 1 tablet (150 mg total) by mouth 2 (two) times daily.   busPIRone (BUSPAR) 15 MG tablet Take 1 tablet (15 mg total) by mouth 3 (three) times daily.   clotrimazole (CLOTRIMAZOLE ANTI-FUNGAL) 1 % cream Apply 1 Application topically 2 (two) times daily.   Cyanocobalamin 2500 MCG SUBL 1 tab sublingual daily   denosumab (PROLIA) 60 MG/ML SOSY injection Inject 60 mg into the skin every 6 (six) months.   ferrous fumarate (HEMOCYTE - 106 MG FE) 325 (106 FE) MG TABS Take 1 tablet by mouth daily. Reported on 10/06/2015   levothyroxine (SYNTHROID) 100 MCG  tablet Take 1 tablet (100 mcg total) by mouth daily before breakfast.   Magnesium 250 MG TABS Take 250 mg by mouth daily.    ranolazine (RANEXA) 500 MG 12 hr tablet Take 1 tablet (500 mg total) by mouth 2 (two) times daily.   rosuvastatin (CRESTOR) 10 MG tablet TAKE 1 TABLET BY MOUTH EVERY DAY   traZODone (DESYREL) 100 MG tablet Take 2 tablets (200 mg total) by mouth at bedtime.   venlafaxine XR (EFFEXOR-XR) 150 MG 24 hr capsule Take 1 capsule (150 mg total) by mouth daily with breakfast.     Allergies:   Morphine sulfate   Social History   Socioeconomic History   Marital status: Married    Spouse name: Not on file   Number of children: 3   Years of education: Not  on file   Highest education level: 12th grade  Occupational History   Occupation: RETIRED    Employer: [employer]  Tobacco Use   Smoking status: Every Day    Packs/day: 1.00    Years: 50.00    Total pack years: 50.00    Types: Cigarettes   Smokeless tobacco: Never   Tobacco comments:    9 cigs/day on 05/06/2021. Back up to 1ppd in 03/2022  Vaping Use   Vaping Use: Never used  Substance and Sexual Activity   Alcohol use: No    Comment: quit on Aug 27, 2015   Drug use: No    Frequency: 20.0 times per week    Comment: last used May 2017   Sexual activity: Not Currently    Partners: Male    Birth control/protection: Post-menopausal  Other Topics Concern   Not on file  Social History Narrative   Ms. Alstrom lives in [place] with her husband. She has 3 grown children and several grand children. She has joint custody of 3 of her grand children as her daughter has bipolar disorder and has needed assistance with children in past.   1-2 cups coffee, 3-6 cups tea per day.   Right-handed.   Social Determinants of Health   Financial Resource Strain: Low Risk  (12/01/2021)   Overall Financial Resource Strain (CARDIA)    Difficulty of Paying Living Expenses: Not hard at all  Food Insecurity: No Food Insecurity  (12/01/2021)   Hunger Vital Sign    Worried About Running Out of Food in the Last Year: Never true    Ran Out of Food in the Last Year: Never true  Transportation Needs: No Transportation Needs (12/01/2021)   PRAPARE - Hydrologist (Medical): No    Lack of Transportation (Non-Medical): No  Physical Activity: Insufficiently Active (12/01/2021)   Exercise Vital Sign    Days of Exercise per Week: 3 days    Minutes of Exercise per Session: 30 min  Stress: No Stress Concern Present (12/01/2021)   [place]    Feeling of Stress : Not at all  Social Connections: Moderately Isolated (12/01/2021)   Social Connection and Isolation Panel [NHANES]    Frequency of Communication with Friends and Family: More than three times a week    Frequency of Social Gatherings with Friends and Family: More than three times a week    Attends Religious Services: Never    Marine scientist or Organizations: No    Attends Music therapist: Never    Marital Status: Married     Family History: The patient's family history includes Alcohol abuse in her maternal uncle; Alzheimer's disease in her mother; Bipolar disorder in her daughter; Cancer in her mother; Colon cancer in her maternal aunt; Dementia in her paternal aunt; Depression in her brother, mother, sister, and sister; Heart disease (age of onset: 2) in her father; Heart disease (age of onset: 18) in her sister; Heart disease (age of onset: 79) in her brother; Mental illness in her daughter; Obesity in her daughter, daughter, and son; Stomach cancer in her maternal grandfather; Suicidality in her daughter. ROS:   Please see the history of present illness.    All 14 point review of systems negative except as described per history of present illness  EKGs/Labs/Other Studies Reviewed:      Recent Labs: 05/12/2022: ALT 17; BUN 10; Creatinine, Ser 0.73;  Hemoglobin  14.1; Platelets 235.0; Potassium 4.1; Sodium 143; TSH 0.65  Recent Lipid Panel    Component Value Date/Time   CHOL 168 03/24/2020 1014   TRIG 86.0 03/24/2020 1014   HDL 89.90 03/24/2020 1014   CHOLHDL 2 03/24/2020 1014   VLDL 17.2 03/24/2020 1014   LDLCALC 61 03/24/2020 1014   LDLDIRECT 46 01/05/2021 1010   LDLDIRECT 133.5 08/09/2012 1036    Physical Exam:    VS:  BP 114/60 (BP Location: Left Arm, Patient Position: Sitting)   Pulse 68   Ht 5' 3.5" (1.613 m)   Wt 177 lb (80.3 kg)   SpO2 96%   BMI 30.86 kg/m     Wt Readings from Last 3 Encounters:  06/15/22 177 lb (80.3 kg)  05/12/22 176 lb 12.8 oz (80.2 kg)  03/10/22 177 lb 3.2 oz (80.4 kg)     GEN:  Well nourished, well developed in no acute distress HEENT: Normal NECK: No JVD; No carotid bruits LYMPHATICS: No lymphadenopathy CARDIAC: RRR, no murmurs, no rubs, no gallops RESPIRATORY:  Clear to auscultation without rales, wheezing or rhonchi  ABDOMEN: Soft, non-tender, non-distended MUSCULOSKELETAL:  No edema; No deformity  SKIN: Warm and dry LOWER EXTREMITIES: no swelling NEUROLOGIC:  Alert and oriented x 3 PSYCHIATRIC:  Normal affect   ASSESSMENT:    1. Peripheral vascular disease, unspecified ([place]) bilateral carotic arterial disease   2. Atherosclerosis of native coronary artery of native heart without angina pectoris   3. COPD (chronic obstructive pulmonary disease) with chronic bronchitis   4. Dyslipidemia   5. TOBACCO USER    PLAN:    In order of problems listed above:  Peripheral vascular disease in form of carotic arterial stenosis, we will schedule him to have carotic ultrasounds in March which will be 6 months after last study. Dyslipidemia I did review her K PN which show me data from 2022 with LDL of 46, no recent blood test so I will ask her to have fasting lipid profile done.  She is taking Crestor 10 which is moderate med statin. COPD which obviously is related to smoking we spent some  time about technique that she can use to quit smoking she did reduce amount of cigarettes only half pack per day but still I told her that the ultimate goal will be completely discontinue smoking. Coronary artery disease nonobstructive, antiplatelet therapy and statin which I will continue   Medication Adjustments/Labs and Tests Ordered: Current medicines are reviewed at length with the patient today.  Concerns regarding medicines are outlined above.  No orders of the defined types were placed in this encounter.  Medication changes: No orders of the defined types were placed in this encounter.   Signed, Park Liter, MD, [facility] 06/15/2022 11:37 AM    [place]

## 2022-06-27 ENCOUNTER — Ambulatory Visit (HOSPITAL_COMMUNITY)
Admission: RE | Admit: 2022-06-27 | Discharge: 2022-06-27 | Disposition: A | Discharge status: Home / Self Care | Payer: Medicare PPO | Source: Ambulatory Visit | Attending: Cardiovascular Disease | Admitting: Cardiovascular Disease

## 2022-06-27 DIAGNOSIS — I2510 Atherosclerotic heart disease of native coronary artery without angina pectoris: Secondary | ICD-10-CM | POA: Diagnosis not present

## 2022-06-27 DIAGNOSIS — I6523 Occlusion and stenosis of bilateral carotid arteries: Secondary | ICD-10-CM | POA: Diagnosis not present

## 2022-06-27 DIAGNOSIS — I739 Peripheral vascular disease, unspecified: Secondary | ICD-10-CM

## 2022-07-01 ENCOUNTER — Telehealth: Payer: Self-pay

## 2022-07-01 NOTE — Telephone Encounter (Signed)
Results of Carotid US reviewed with pt as per Dr. Wendy Poet note.  Pt verbalized understanding and had no additional questions. Routed to PCP

## 2022-08-01 ENCOUNTER — Encounter: Payer: Self-pay | Admitting: Family Medicine

## 2022-08-01 ENCOUNTER — Ambulatory Visit (INDEPENDENT_AMBULATORY_CARE_PROVIDER_SITE_OTHER): Payer: Medicare PPO | Admitting: Family Medicine

## 2022-08-01 VITALS — BP 136/84 | HR 78 | Temp 98.1°F | Wt 177.0 lb

## 2022-08-01 DIAGNOSIS — L03313 Cellulitis of chest wall: Secondary | ICD-10-CM

## 2022-08-01 DIAGNOSIS — B372 Candidiasis of skin and nail: Secondary | ICD-10-CM | POA: Diagnosis not present

## 2022-08-01 MED ORDER — CEPHALEXIN 500 MG PO CAPS: 500.0000 mg | ORAL_CAPSULE | Freq: Two times a day (BID) | ORAL | 0 refills | Status: AC

## 2022-08-01 NOTE — Progress Notes (Signed)
Kendra Ball , [birth year], 77 y.o., female MRN: [id number] Patient Care Team    Relationship Specialty Notifications Start End  Natalia Leatherwood, DO PCP - General Family Medicine  12/30/14   Charna Elizabeth, MD Consulting Physician Gastroenterology  05/09/14   Charmian Muff, LCAS (Inactive)  Psychology  10/16/15   Oletta Darter, MD  Psychiatry  12/12/16   Ladene Artist, MD Consulting Physician Oncology  04/23/20   Georgeanna Lea, MD Consulting Physician Cardiology  05/12/22     Chief Complaint  Patient presents with   Breast Pain    Redness (spots) under right breast; raw with discharge     Subjective: Kendra Ball is a 77 y.o. Pt presents for an OV with complaints of rash of few weeks duration.  Associated symptoms include redness and seeping. Pt reports she has a rash that started under her right breast.She has a few round parches of rash on her upper right abd as well.  She has h/o lymphoma.       08/01/2022    1:52 PM 06/09/2022   10:18 AM 04/07/2022   10:47 AM 12/01/2021    2:42 PM 10/14/2021    2:21 PM  Depression screen PHQ 2/9  Decreased Interest 0   0   Down, Depressed, Hopeless 0   0   PHQ - 2 Score 0   0      Information is confidential and restricted. Go to Review Flowsheets to unlock data.    Allergies  Allergen Reactions   Morphine Sulfate Swelling and Other (See Comments)    Swelling around injection area   Social History   Social History Narrative   Kendra Ball lives in [place] with her husband. She has 3 grown children and several grand children. She has joint custody of 3 of her grand children as her daughter has bipolar disorder and has needed assistance with children in past.   1-2 cups coffee, 3-6 cups tea per day.   Right-handed.   Past Medical History:  Diagnosis Date   Abnormal mammogram 10/01/2013   Diagnostic [facility] & [place] confirm cyst & duct ectasia of L breast. 08/2013.    Abnormal stress test 05/27/2020   Abnormal weight gain  05/05/2020   Adenopathy    right supraclavicular   Anxiety    Aortic atherosclerosis 05/05/2020   Arthritis    At high risk for falls 01/29/2019   Carotid bruit 05/08/2020   Chronic diarrhea 10/13/2015   COPD (chronic obstructive pulmonary disease)    COPD (chronic obstructive pulmonary disease) with chronic bronchitis 02/13/2015   Coronary atherosclerosis 10/22/2015   DEPRESSION 08/26/2008   Diffuse large B cell lymphoma 11/23/2017   Dyslipidemia 10/03/2013   31% reduction LDL after 6 mos of Simvastatin 10 mg qd. RF: smoker, fam Hx, sedentary HDL is high!!    Dyspnea on exertion 05/08/2020   Essential hypertension 05/22/2014   Family history of malignant neoplasm of gastrointestinal tract 05/05/2020   Family history of malignant neoplasm of gastrointestinal tract 05/05/2020   Family history of premature coronary heart disease 08/13/2013   Father, brother, sister Personal risk factors: smoker, female over 31, fam Hx, sedentary, thyroid disease, dyslipidemia, BMI over 25.    GAD (generalized anxiety disorder) 01/30/2014   Gait abnormality 08/06/2019   H/O malignant lymphoma 05/23/2019   Headache    History of colon polyps 10/13/2015   History of gastric bypass 09/10/2014   Hx of colonic polyps  First noted on  colonoscopy 2012   Hypercholesteremia    HYPERTENSION 08/26/2008   HYPOTHYROIDISM 08/26/2008   Hypothyroidism 08/26/2008   Current meds: levothyroxine 100 mcg qd.     Insomnia 05/05/2015   Iron deficiency anemia 05/05/2020   Lymphoma 2019   B-cell   MDD (major depressive disorder), recurrent episode, moderate 01/30/2014   [facility] 05/2015 08/18/2015:  Effexor XR 225mg  po qD for depression and anxiety Wellbutrin SR to 200mg  po BID for depression   Xanax 0.5mg  po BID and 1mg  po qHS prn anxiety       Routine PRN Medications:  Yes    Consultations: encouraged to f/up with PCP as needed, encouraged to start individual therapy   Safety Concerns:  Pt denies SI and is at an acute low risk for suicide.Patient told  to call clinic i   Multiple fractures 01/29/2019   Obesity    Osteoporosis    Paresthesia 08/06/2019   Port-A-Cath in place 12/27/2017   Pulmonary nodule seen on imaging study 02/13/2015   Smoker 09/14/2015   Substance abuse    inpatient and outpatient tx for substance abuse   TOBACCO USER 12/25/2009   Tremor 05/23/2019   Past Surgical History:  Procedure Laterality Date   AXILLARY LYMPH NODE BIOPSY Right 06/22/2017   Procedure: RIGHT AXILLARY LYMPH NODE BIOPSY;  Surgeon: Manus Rudd, MD;  [facility];  Service: General;  Laterality: Right;   BARIATRIC SURGERY  2006   CESAREAN SECTION     COLON RESECTION N/A 05/09/2014   Procedure: LAPAROSCOPIC HAND-ASSISTED EXTENDED RIGHT COLECTOMY, LAPAROSCOPIC LYSIS OF ADHESIONS, SPLENIC FLEXURE MOBILIZATION.;  Surgeon: Karie Soda, MD;  Location[facility];  Service: General;  Laterality: N/A;   DILATION AND CURETTAGE OF UTERUS     x2   FOREARM FRACTURE SURGERY     right   GASTRIC BYPASS  2006   IR IMAGING GUIDED PORT INSERTION  12/01/2017   IR REMOVAL TUN ACCESS W/ PORT W/O FL MOD SED  07/03/2018   MASS EXCISION Right 02/24/2017   Procedure: EXCISIONAL BIOPSY RIGHT SUPRA-CLAVICULAR NODE;  Surgeon: Flo Shanks, MD;  Location: [facility];  Service: ENT;  Laterality: Right;   TONSILLECTOMY     Family History  Problem Relation Age of Onset   Cancer Mother        colon   Depression Mother    Alzheimer's disease Mother        Dementia   Heart disease Father 13   Heart disease Sister 40       stents, pacemaker   Depression Sister    Heart disease Brother 24       CABG, MI   Depression Brother    Obesity Daughter    Suicidality Daughter    Bipolar disorder Daughter    Obesity Son    Mental illness Daughter        bipolar   Obesity Daughter    Depression Sister    Dementia Paternal Aunt    Alcohol abuse Maternal Uncle    Colon cancer Maternal Aunt    Stomach cancer Maternal Grandfather    Allergies as of 08/01/2022        Reactions   Morphine Sulfate Swelling, Other (See Comments)   Swelling around injection area        Medication List        Accurate as of August 01, 2022  2:41 PM. If you have any questions, ask your nurse or doctor.  albuterol 108 (90 Base) MCG/ACT inhaler Commonly known as: VENTOLIN HFA Inhale 2 puffs into the lungs every 6 (six) hours as needed for wheezing or shortness of breath.   aspirin EC 81 MG tablet Take 1 tablet (81 mg total) by mouth daily. Swallow whole.   buPROPion 150 MG 12 hr tablet Commonly known as: WELLBUTRIN SR Take 1 tablet (150 mg total) by mouth 2 (two) times daily.   busPIRone 15 MG tablet Commonly known as: BUSPAR Take 1 tablet (15 mg total) by mouth 3 (three) times daily.   cephALEXin 500 MG capsule Commonly known as: KEFLEX Take 1 capsule (500 mg total) by mouth 2 (two) times daily for 7 days. Started by: Felix Pacini, DO   clotrimazole 1 % cream Commonly known as: Clotrimazole Anti-Fungal Apply 1 Application topically 2 (two) times daily.   Cyanocobalamin 2500 MCG Subl 1 tab sublingual daily   denosumab 60 MG/ML Sosy injection Commonly known as: PROLIA Inject 60 mg into the skin every 6 (six) months.   ferrous fumarate 325 (106 Fe) MG Tabs tablet Commonly known as: HEMOCYTE - 106 mg FE Take 1 tablet by mouth daily. Reported on 10/06/2015   levothyroxine 100 MCG tablet Commonly known as: SYNTHROID Take 1 tablet (100 mcg total) by mouth daily before breakfast.   Magnesium 250 MG Tabs Take 250 mg by mouth daily.   ranolazine 500 MG 12 hr tablet Commonly known as: RANEXA Take 1 tablet (500 mg total) by mouth 2 (two) times daily.   rosuvastatin 10 MG tablet Commonly known as: CRESTOR TAKE 1 TABLET BY MOUTH EVERY DAY   Symbicort 160-4.5 MCG/ACT inhaler Generic drug: budesonide-formoterol Inhale 2 puffs into the lungs in the morning and at bedtime.   traZODone 100 MG tablet Commonly known as: DESYREL Take 2 tablets  (200 mg total) by mouth at bedtime.   venlafaxine XR 150 MG 24 hr capsule Commonly known as: EFFEXOR-XR Take 1 capsule (150 mg total) by mouth daily with breakfast.        All past medical history, surgical history, allergies, family history, immunizations andmedications were updated in the EMR today and reviewed under the history and medication portions of their EMR.     Review of Systems  Constitutional:  Negative for chills and fever.  Skin:  Positive for rash.   Negative, with the exception of above mentioned in HPI   Objective:  BP 136/84   Pulse 78   Temp 98.1 F (36.7 C)   Wt 177 lb (80.3 kg)   SpO2 93%   BMI 30.86 kg/m  Body mass index is 30.86 kg/m. Physical Exam Vitals and nursing note reviewed.  Constitutional:      General: She is not in acute distress.    Appearance: Normal appearance. She is normal weight. She is not ill-appearing or toxic-appearing.  HENT:     Head: Normocephalic and atraumatic.  Eyes:     General: No scleral icterus.       Right eye: No discharge.        Left eye: No discharge.     Extraocular Movements: Extraocular movements intact.     Conjunctiva/sclera: Conjunctivae normal.     Pupils: Pupils are equal, round, and reactive to light.  Chest:       Comments: Erythema, red flat rash under right breast and round lesions with ring left abd wall. No ovious drainage currenlty Skin:    Findings: No rash.  Neurological:     Mental Status: She is  alert and oriented to person, place, and time. Mental status is at baseline.     Motor: No weakness.     Coordination: Coordination normal.     Gait: Gait normal.  Psychiatric:        Mood and Affect: Mood normal.        Behavior: Behavior normal.        Thought Content: Thought content normal.        Judgment: Judgment normal.      No results found. No results found. No results found for this or any previous visit (from the past 24 hour(s)).  Assessment/Plan: Kendra Ball  is a 77 y.o. female present for OV for  Yeast infection of the skin/Cellulitis of chest wall Fungal infection with cellulitis component.  Start clotrimazole BID x4-6 weeks.  Start keflex BID x7 days. Gentle soap and warm water cleanse with new towels each time.  Discussed keeping area clean and dry.  If not seeing improvement would encourage her to discuss with onc as well.   Reviewed expectations re: course of current medical issues. Discussed self-management of symptoms. Outlined signs and symptoms indicating need for more acute intervention. Patient verbalized understanding and all questions were answered. Patient received an After-Visit Summary.    No orders of the defined types were placed in this encounter.  Meds ordered this encounter  Medications   cephALEXin (KEFLEX) 500 MG capsule    Sig: Take 1 capsule (500 mg total) by mouth 2 (two) times daily for 7 days.    Dispense:  14 capsule    Refill:  0   Referral Orders  No referral(s) requested today     Note is dictated utilizing voice recognition software. Although note has been proof read prior to signing, occasional typographical errors still can be missed. If any questions arise, please do not hesitate to call for verification.   electronically signed by:  Felix Pacinienee Aesha Agrawal, DO  [facility] - [place]

## 2022-08-01 NOTE — Patient Instructions (Addendum)
[facility] dermatology (Dr. Lenis Dickinson)  Use cream every 12 hours for 4-6 weeks.  Take antibiotic every 12 hours for 7 days.    Skin Yeast Infection  A skin yeast infection is a condition in which there is an overgrowth of yeast (Candida) that normally lives on the skin. This condition usually occurs in areas of the skin that are constantly warm and moist, such as the skin under the breasts or armpits, or in the groin and other body folds. What are the causes? This condition is caused by a change in the normal balance of the yeast that live on the skin. What increases the risk? You are more likely to develop this condition if you: Are obese. Are pregnant. Are 13 years of age or older. Wear tight clothing. Have any of the following conditions: Diabetes. Malnutrition. A weak body defense system (immune system). Take medicines such as: Birth control pills. Antibiotics. Steroid medicines. What are the signs or symptoms? The most common symptom of this condition is itchiness in the affected area. Other symptoms include: A red, swollen area of the skin. Bumps on the skin. How is this diagnosed? This condition is diagnosed with a medical history and physical exam. Your health care provider may check for yeast by taking scrapings of the skin to be viewed under a microscope. How is this treated? This condition is treated with medicine. Medicines may be prescribed or available over the counter. The medicines may be: Taken by mouth (orally). Applied as a cream or powder to your skin. Follow these instructions at home:  Take or apply over-the-counter and prescription medicines only as told by your health care provider. Maintain a healthy weight. If you need help losing weight, talk with your health care provider. Keep your skin clean and dry. Wear loose-fitting clothing. If you have diabetes, keep your blood sugar under control. Keep all follow-up visits. This is important. Contact a  health care provider if: Your symptoms go away and then come back. Your symptoms do not get better with treatment. Your symptoms get worse. Your rash spreads. You have a fever or chills. You have new symptoms. You have new warmth or redness of your skin. Your rash is painful or bleeding. Summary A skin yeast infection is a condition in which there is an overgrowth of yeast (Candida) that normally lives on the skin. Take or apply over-the-counter and prescription medicines only as told by your health care provider. Keep your skin clean and dry. Contact a health care provider if your symptoms do not get better with treatment. This information is not intended to replace advice given to you by your health care provider. Make sure you discuss any questions you have with your health care provider. Document Revised: 06/30/2020 Document Reviewed: 06/30/2020 Elsevier Patient Education  2023 [employer].

## 2022-08-22 ENCOUNTER — Telehealth: Payer: Self-pay | Admitting: *Deleted

## 2022-08-22 NOTE — Telephone Encounter (Signed)
Received message that patient was requesting an appointment re: concern of abnormality of right breast. Called and left her VM to keep her scheduled appointment on 08/25/22 at 10:00 w/Dr. Truett Perna.

## 2022-08-25 ENCOUNTER — Other Ambulatory Visit: Payer: Self-pay | Admitting: *Deleted

## 2022-08-25 ENCOUNTER — Inpatient Hospital Stay: Payer: Medicare PPO | Attending: Oncology | Admitting: Oncology

## 2022-08-25 VITALS — BP 126/62 | HR 78 | Temp 98.2°F | Resp 18 | Ht 63.0 in | Wt 176.6 lb

## 2022-08-25 DIAGNOSIS — C8338 Diffuse large B-cell lymphoma, lymph nodes of multiple sites: Secondary | ICD-10-CM | POA: Diagnosis not present

## 2022-08-25 DIAGNOSIS — R21 Rash and other nonspecific skin eruption: Secondary | ICD-10-CM | POA: Diagnosis not present

## 2022-08-25 DIAGNOSIS — Z800 Family history of malignant neoplasm of digestive organs: Secondary | ICD-10-CM | POA: Diagnosis not present

## 2022-08-25 DIAGNOSIS — E7800 Pure hypercholesterolemia, unspecified: Secondary | ICD-10-CM | POA: Diagnosis not present

## 2022-08-25 DIAGNOSIS — E039 Hypothyroidism, unspecified: Secondary | ICD-10-CM | POA: Insufficient documentation

## 2022-08-25 DIAGNOSIS — Z79899 Other long term (current) drug therapy: Secondary | ICD-10-CM | POA: Diagnosis not present

## 2022-08-25 DIAGNOSIS — I10 Essential (primary) hypertension: Secondary | ICD-10-CM | POA: Diagnosis not present

## 2022-08-25 DIAGNOSIS — Z808 Family history of malignant neoplasm of other organs or systems: Secondary | ICD-10-CM | POA: Diagnosis not present

## 2022-08-25 MED ORDER — FLUCONAZOLE 100 MG PO TABS: 100.0000 mg | ORAL_TABLET | Freq: Every day | ORAL | 0 refills | Status: DC

## 2022-08-25 NOTE — Progress Notes (Signed)
[name] OFFICE PROGRESS NOTE   Diagnosis: Hodgkin's lymphoma  INTERVAL HISTORY:   Kendra Ball has persistent "scaling "over the right breast.  She has an erythematous rash inferior to the right breast and over the lower anterior chest wall.  She saw Dr. Claiborne Billings and was prescribed clotrimazole cream and Keflex.  The rash has not improved. A follow-up mammogram 04/11/2022 revealed mild right anterior breast skin thickening, unchanged.  No breast masses.  Good appetite.  No palpable lymph nodes.  She fell walking on 08/23/2022 and injured the right forehead and left knee. Objective:  Vital signs in last 24 hours:  Blood pressure 126/62, pulse 78, temperature 98.2 F (36.8 C), temperature source Oral, resp. rate 18, height 5\' 3"  (1.6 m), weight 176 lb 9.6 oz (80.1 kg), SpO2 98 %.    HEENT: Ecchymosis at the right forehead and periorbital area Lymphatics: No cervical, supraclavicular, axillary, or inguinal nodes Resp: Lungs clear bilaterally Cardio: Regular rate and rhythm GI: No hepatosplenomegaly Vascular: No leg edema, mild edema of the left arm Breast: Dryness and scaling of the skin at the right areola, upper and lateral breast.  No mass. Skin: Erythematous demarcated rash inferior to the right breast with 2 round demarcated erythematous lesions at the lower anterior chest wall/upper abdomen, similar rash to a lesser degree inferior to the left breast.  Erythematous rash in the right greater than left groin   Lab Results:  Lab Results  Component Value Date   WBC 5.9 05/12/2022   HGB 14.1 05/12/2022   HCT 42.2 05/12/2022   MCV 102.4 (H) 05/12/2022   PLT 235.0 05/12/2022   NEUTROABS 4.0 05/12/2022    CMP  Lab Results  Component Value Date   NA 143 05/12/2022   K 4.1 05/12/2022   CL 107 05/12/2022   CO2 28 05/12/2022   GLUCOSE 95 05/12/2022   BUN 10 05/12/2022   CREATININE 0.73 05/12/2022   CALCIUM 8.9 05/12/2022   PROT 6.1 05/12/2022   ALBUMIN  3.9 05/12/2022   AST 20 05/12/2022   ALT 17 05/12/2022   ALKPHOS 39 05/12/2022   BILITOT 0.6 05/12/2022   GFRNONAA >60 07/03/2018   GFRAA >60 07/03/2018    Medications: I have reviewed the patient's current medications.   Assessment/Plan: Non-Hodgkin's lymphoma, diffuse large B-cell lymphoma, CD20 positive Ultrasound of the neck 02/18/2017- right supraclavicular adenopathy with at least 3 nodes, largest measuring 1.3 x 1.1 x 1.4 cm; 2 adjacent hypoechoic right occipital lymph nodes Excisional biopsy right supraclavicular lymphadenopathy 02/24/2017 (Dr. Vergie Living lymphoid proliferation.  Necrotic soft tissue with associated acute and chronic inflammation.  By flow cytometry no monoclonal B-cell or phenotypically aberrant T-cell population.  Special stains for microorganisms including AFB, GMS, PAS and Warthin-Starry, are negative. CT chest 05/30/2017- multiple enlarged right axillary lymph nodes.  Largest right axillary lymph node measures 2.6 x 2.2 cm.  Single enlarged left axillary lymph node measuring 16 mm. CT neck 06/03/2017- known bilateral axillary lymphadenopathy partially covered on the scan.  No lymphadenopathy seen in the neck. Biopsy right axillary lymph node 06/22/2017 (Dr. Corliss Skains)- fibroadipose tissue with necrosis, chronic inflammation and granulation tissue PET scan 07/19/2017- hypermetabolic bilateral axillary lymph nodes.  Hypermetabolic but small right supraclavicular lymph nodes.  In the right omentum there is a small focus of rim density and internal fat density which is hypermetabolic. CT neck 10/25/2017- progressed bilateral subclavian lymphadenopathy since February, right greater than left.  Conspicuous enlargement of the small lymph nodes at the right level 2  and 3 nodal stations. CT chest/abdomen/pelvis 10/25/2017- progressive adenopathy identified within the left axilla, bilateral supraclavicular stations and left inguinal region.  Persistent but improved right axillary  adenopathy.  Scattered indeterminate low-attenuation foci within the spleen not seen on study from 05/30/2017. Excisional biopsy of right supraclavicular and posterior triangle lymph nodes on 11/16/2017-diffuse large B-cell lymphoma, CD20 positive PET scan 11/28/2017- new hypermetabolic lymph nodes and progressive hypermetabolism in existing lymph nodes.  Hypermetabolic focus along the anterior aspect of the right scapula with adjacent soft tissue mass.  No new adenopathy involving the mediastinal or hilar regions of the chest or the abdomen or pelvis.  No obvious involvement of the liver or spleen.  IPI score 3 Cycle 1 CHOP/Rituxan 12/04/2017 Cycle 2 CHOP/Rituxan 12/27/2017 Cycle 3 CHOP/Rituxan 01/16/2018  Cycle 4 CHOP/Rituxan 02/05/2018 PET 02/19/2018- resolution of previously noted hypermetabolic adenopathy, no residual hypermetabolic osseous lesions Cycle 5 CHOP/rituximab 02/27/2018 Cycle 6 CHOP/rituximab 03/20/2018 Hypertension  Hypothyroid Hypercholesterolemia Family history significant for colon cancer, stomach cancer and brain cancer Ongoing tobacco use Port-A-Cath placement 12/01/2017, Interventional Radiology Left arm edema-negative venous Doppler 02/26/2018 Yeast rash at the bilateral breast and groin skin folds-Diflucan 08/25/2022      Disposition: Kendra Ball remains in clinical remission from non-Hodgkin's lymphoma.  She presents today for evaluation of a persistent rash at the breast and groin skin folds.  She appears to have a yeast rash.  I recommended she keep the areas dry and use cornstarch powder.  Rash did not improve with ketoconazole cream or Keflex.  She will complete a course of Diflucan.  She will follow-up with Dr. Claiborne Billings if the rash does not improve.  She will continue follow-up of the scaling and skin thickening of the right breast with Dr. Claiborne Billings and the breast center.  Kendra Ball will return for an office visit in July.  I am available to see her sooner as  needed.  Thornton Papas, MD  08/25/2022  10:40 AM

## 2022-08-25 NOTE — Progress Notes (Signed)
Per Dr. Truett Perna request: script for fluconazole sent to [facility].

## 2022-08-29 ENCOUNTER — Encounter (HOSPITAL_COMMUNITY): Payer: Self-pay

## 2022-09-01 ENCOUNTER — Telehealth (HOSPITAL_BASED_OUTPATIENT_CLINIC_OR_DEPARTMENT_OTHER): Payer: Medicare PPO | Admitting: Psychiatry

## 2022-09-01 ENCOUNTER — Encounter (HOSPITAL_COMMUNITY): Payer: Self-pay | Admitting: Psychiatry

## 2022-09-01 DIAGNOSIS — F1020 Alcohol dependence, uncomplicated: Secondary | ICD-10-CM | POA: Diagnosis not present

## 2022-09-01 DIAGNOSIS — F411 Generalized anxiety disorder: Secondary | ICD-10-CM

## 2022-09-01 DIAGNOSIS — F5105 Insomnia due to other mental disorder: Secondary | ICD-10-CM

## 2022-09-01 DIAGNOSIS — F17200 Nicotine dependence, unspecified, uncomplicated: Secondary | ICD-10-CM

## 2022-09-01 DIAGNOSIS — F331 Major depressive disorder, recurrent, moderate: Secondary | ICD-10-CM | POA: Diagnosis not present

## 2022-09-01 MED ORDER — NICOTINE POLACRILEX 2 MG MT GUM
2.0000 mg | CHEWING_GUM | OROMUCOSAL | 2 refills | Status: DC | PRN
Start: 2022-09-01 — End: 2022-12-09

## 2022-09-01 MED ORDER — BUSPIRONE HCL 15 MG PO TABS
15.0000 mg | ORAL_TABLET | Freq: Three times a day (TID) | ORAL | 1 refills | Status: DC
Start: 2022-09-01 — End: 2023-01-31

## 2022-09-01 MED ORDER — TRAZODONE HCL 100 MG PO TABS
200.0000 mg | ORAL_TABLET | Freq: Every day | ORAL | 1 refills | Status: DC
Start: 2022-09-01 — End: 2023-01-31

## 2022-09-01 MED ORDER — BUPROPION HCL ER (SR) 150 MG PO TB12: 150.0000 mg | ORAL_TABLET | Freq: Two times a day (BID) | ORAL | 1 refills | Status: DC

## 2022-09-01 MED ORDER — VENLAFAXINE HCL ER 150 MG PO CP24
150.0000 mg | ORAL_CAPSULE | Freq: Every day | ORAL | 1 refills | Status: DC
Start: 2022-09-01 — End: 2023-01-31

## 2022-09-01 NOTE — Progress Notes (Signed)
Virtual Visit via Video Note  I connected with Kendra Ball on 09/01/22 at 11:00 AM EDT by a video enabled telemedicine application and verified that I am speaking with the correct person using two identifiers.  Location: Patient: home Provider: office   I discussed the limitations of evaluation and management by telemedicine and the availability of in person appointments. The patient expressed understanding and agreed to proceed.  History of Present Illness: Kendra Ball shares she is doing well. She walks with her eldest daughter in law a few times a week. Yesterday she tripped over the dog and hurt her forehead. Kendra Ball shares that she has not been in such a good place now as she has been in years. 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 She has situational depression and anxiety. When it occurs it is mild and manageable. Her sleep, energy and appetite are good. Most nights she gets about 6-8 hrs/sleep a night. Kendra Ball denies isolation and hopelessness. She denies SI/HI. Kendra Ball denies any alcohol use. She continues to smoke 1ppd. She has tried patches but took them off after a few hours. Kendra Ball feels this combination of meds are working well and denies SE.     Observations/Objective: Psychiatric Specialty Exam: ROS  There were no vitals taken for this visit.There is no height or weight on file to calculate BMI.  General Appearance: Casual  Eye Contact:  Good  Speech:  Clear and Coherent and Normal Rate  Volume:  Normal  Mood:  Euthymic  Affect:  Full Range  Thought Process:  Goal Directed, Linear, and Descriptions of Associations: Intact  Orientation:  Full (Time, Place, and Person)  Thought Content:  Logical  Suicidal Thoughts:  No  Homicidal Thoughts:  No  Memory:  Immediate;   Good  Judgement:  Good  Insight:  Good  Psychomotor Activity:  Normal  Concentration:  Concentration: Good  Recall:  Good  Fund of Knowledge:   Good  Language:  Good  Akathisia:  No  Handed:  Right  AIMS (if indicated):     Assets:  Communication Skills Desire for Improvement Financial Resources/Insurance Housing Intimacy Leisure Time Resilience Social Support Talents/Skills Transportation Vocational/Educational  ADL's:  Intact  Cognition:  WNL  Sleep:        Assessment and Plan:     08/01/2022    1:52 PM 06/09/2022   10:18 AM 04/07/2022   10:47 AM 12/01/2021    2:42 PM 10/14/2021    2:21 PM  Depression screen PHQ 2/9  Decreased Interest 0 0 0 0 0  Down, Depressed, Hopeless 0 0 0 0 1  PHQ - 2 Score 0 0 0 0 1    Flowsheet Row Video Visit from 06/09/2022 in [facility] Video Visit from 04/07/2022 in [facility] Video Visit from 10[facility]14/2021 in [facility]  C-SSRS RISK CATEGORY No Risk No Risk No Risk          Pt is aware that these meds carry a teratogenic risk. Pt will discuss plan of action if she does or plans to become pregnant in the future.  Status of current problems: stable. Still smoking    Medication management with supportive therapy. Risks and benefits, side effects and alternative treatment options discussed with patient. Pt was given an opportunity to ask questions about medication, illness, and treatment. All current psychiatric medications have been reviewed and discussed with  the patient and adjusted as clinically appropriate.  Pt verbalized understanding and verbal consent obtained for treatment.  Meds: start nicotine gum  1. Nicotine use disorder - nicotine polacrilex (NICORETTE) 2 MG gum; Take 1 each (2 mg total) by mouth as needed for smoking cessation.  Dispense: 100 tablet; Refill: 2 - buPROPion (WELLBUTRIN SR) 150 MG 12 hr tablet; Take 1 tablet (150 mg total) by mouth 2 (two) times daily.  Dispense: 180 tablet; Refill: 1  2. MDD (major depressive disorder), recurrent  episode, moderate (HCC) - buPROPion (WELLBUTRIN SR) 150 MG 12 hr tablet; Take 1 tablet (150 mg total) by mouth 2 (two) times daily.  Dispense: 180 tablet; Refill: 1 - busPIRone (BUSPAR) 15 MG tablet; Take 1 tablet (15 mg total) by mouth 3 (three) times daily.  Dispense: 270 tablet; Refill: 1 - traZODone (DESYREL) 100 MG tablet; Take 2 tablets (200 mg total) by mouth at bedtime.  Dispense: 180 tablet; Refill: 1 - venlafaxine XR (EFFEXOR-XR) 150 MG 24 hr capsule; Take 1 capsule (150 mg total) by mouth daily with breakfast.  Dispense: 90 capsule; Refill: 1  3. GAD (generalized anxiety disorder) - busPIRone (BUSPAR) 15 MG tablet; Take 1 tablet (15 mg total) by mouth 3 (three) times daily.  Dispense: 270 tablet; Refill: 1 - venlafaxine XR (EFFEXOR-XR) 150 MG 24 hr capsule; Take 1 capsule (150 mg total) by mouth daily with breakfast.  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  Dispense: 180 tablet; Refill: 1  5. Alcohol use disorder, moderate, dependence (HCC) - busPIRone (BUSPAR) 15 MG tablet; Take 1 tablet (15 mg total) by mouth 3 (three) times daily.  Dispense: 270 tablet; Refill: 1 - traZODone (DESYREL) 100 MG tablet; Take 2 tablets (200 mg total) by mouth at bedtime.  Dispense: 180 tablet; Refill: 1      Therapy: brief supportive therapy provided. Discussed psychosocial stressors in detail.     -advised to  to check her BP at least once/week due to potential SE from medications.   - reviewed smoking cessation and talked about ways quit and referred to 1-800- quitnow    Patient/Guardian was advised Release of Information must be obtained prior to any record release in order to collaborate their care with an outside provider. Patient/Guardian was advised if they have not already done so to contact the registration department to sign all necessary forms in order for [place] to  release information regarding their care.   Consent: Patient/Guardian gives verbal consent for treatment and assignment of benefits for services provided during this visit. Patient/Guardian expressed understanding and agreed to proceed.      Follow Up Instructions: Follow up in 5-6 months or sooner if needed  Patient informed that I am leaving [facility] in 10/2022 and I relayed that they will be getting a new provider after that. Patient verbalized understanding and agreed with the plan.     I discussed the assessment and treatment plan with the patient. The patient was provided an opportunity to ask questions and all were answered. The patient agreed with the plan and demonstrated an understanding of the instructions.   The patient was advised to call back or seek an in-person evaluation if the symptoms worsen or if the condition fails to improve as anticipated.  I provided 16 minutes of non-face-to-face time during this encounter.   Oletta Darter, MD

## 2022-09-05 ENCOUNTER — Telehealth (HOSPITAL_COMMUNITY): Payer: Self-pay | Admitting: Psychiatry

## 2022-09-08 ENCOUNTER — Ambulatory Visit: Payer: Medicare PPO | Admitting: Oncology

## 2022-10-07 ENCOUNTER — Ambulatory Visit: Payer: Medicare PPO | Admitting: Oncology

## 2022-10-28 ENCOUNTER — Other Ambulatory Visit: Payer: Self-pay

## 2022-10-28 MED ORDER — LEVOTHYROXINE SODIUM 100 MCG PO TABS: 100.0000 ug | ORAL_TABLET | Freq: Every day | ORAL | 0 refills | Status: DC

## 2022-10-28 NOTE — Telephone Encounter (Signed)
RF request for levothyroxine (SYNTHROID) 100 MCG tablet   LOV: 08/01/22, acute // 05/12/22 [facility] Next ov: N/A Last written: 09/17/21 (90,3)  Pt is due for [facility] follow up. If pt calls, please assist with scheduling, thanks.

## 2022-10-30 ENCOUNTER — Other Ambulatory Visit: Payer: Self-pay | Admitting: Family Medicine

## 2022-11-10 ENCOUNTER — Other Ambulatory Visit: Payer: Self-pay | Admitting: Family Medicine

## 2022-11-10 DIAGNOSIS — Z1231 Encounter for screening mammogram for malignant neoplasm of breast: Secondary | ICD-10-CM

## 2022-11-16 ENCOUNTER — Ambulatory Visit (INDEPENDENT_AMBULATORY_CARE_PROVIDER_SITE_OTHER): Payer: Medicare PPO | Admitting: Family Medicine

## 2022-11-16 VITALS — BP 126/80 | HR 66 | Wt 168.8 lb

## 2022-11-16 DIAGNOSIS — E063 Autoimmune thyroiditis: Secondary | ICD-10-CM

## 2022-11-16 DIAGNOSIS — Z122 Encounter for screening for malignant neoplasm of respiratory organs: Secondary | ICD-10-CM

## 2022-11-16 DIAGNOSIS — E038 Other specified hypothyroidism: Secondary | ICD-10-CM | POA: Diagnosis not present

## 2022-11-16 DIAGNOSIS — E785 Hyperlipidemia, unspecified: Secondary | ICD-10-CM | POA: Diagnosis not present

## 2022-11-16 DIAGNOSIS — C8330 Diffuse large B-cell lymphoma, unspecified site: Secondary | ICD-10-CM | POA: Diagnosis not present

## 2022-11-16 DIAGNOSIS — F17210 Nicotine dependence, cigarettes, uncomplicated: Secondary | ICD-10-CM

## 2022-11-16 DIAGNOSIS — M810 Age-related osteoporosis without current pathological fracture: Secondary | ICD-10-CM | POA: Diagnosis not present

## 2022-11-16 LAB — CBC WITH DIFFERENTIAL/PLATELET
Basophils Absolute: 0.1 10*3/uL (ref 0.0–0.1)
Basophils Relative: 0.9 % (ref 0.0–3.0)
Eosinophils Absolute: 0.1 10*3/uL (ref 0.0–0.7)
Eosinophils Relative: 0.9 % (ref 0.0–5.0)
HCT: 43.9 % (ref 36.0–46.0)
Hemoglobin: 14.1 g/dL (ref 12.0–15.0)
Lymphocytes Relative: 21.4 % (ref 12.0–46.0)
Lymphs Abs: 1.6 10*3/uL (ref 0.7–4.0)
MCHC: 32.2 g/dL (ref 30.0–36.0)
MCV: 103.8 fl — ABNORMAL HIGH (ref 78.0–100.0)
Monocytes Absolute: 0.5 10*3/uL (ref 0.1–1.0)
Monocytes Relative: 6.2 % (ref 3.0–12.0)
Neutro Abs: 5.2 10*3/uL (ref 1.4–7.7)
Neutrophils Relative %: 70.6 % (ref 43.0–77.0)
Platelets: 237 10*3/uL (ref 150.0–400.0)
RBC: 4.23 Mil/uL (ref 3.87–5.11)
RDW: 13.2 % (ref 11.5–15.5)
WBC: 7.4 10*3/uL (ref 4.0–10.5)

## 2022-11-16 LAB — BASIC METABOLIC PANEL
BUN: 11 mg/dL (ref 6–23)
CO2: 25 mEq/L (ref 19–32)
Calcium: 9.1 mg/dL (ref 8.4–10.5)
Chloride: 104 mEq/L (ref 96–112)
Creatinine, Ser: 0.69 mg/dL (ref 0.40–1.20)
GFR: 84.27 mL/min (ref >60.00)
Glucose, Bld: 95 mg/dL (ref 70–99)
Potassium: 3.9 mEq/L (ref 3.5–5.1)
Sodium: 140 mEq/L (ref 135–145)

## 2022-11-16 LAB — LIPID PANEL
Cholesterol: 148 mg/dL (ref 0–200)
HDL: 78.9 mg/dL (ref >39.00)
LDL Cholesterol: 53 mg/dL (ref 0–99)
NonHDL: 69.15
Total CHOL/HDL Ratio: 2
Triglycerides: 81 mg/dL (ref 0.0–149.0)
VLDL: 16.2 mg/dL (ref 0.0–40.0)

## 2022-11-16 LAB — TSH: TSH: 0.31 u[IU]/mL — ABNORMAL LOW (ref 0.35–5.50)

## 2022-11-16 LAB — VITAMIN D 25 HYDROXY (VIT D DEFICIENCY, FRACTURES): VITD: 39.04 ng/mL (ref 30.00–100.00)

## 2022-11-16 NOTE — Progress Notes (Signed)
Kendra Ball , [birth year], 77 y.o., female MRN: [id number] Patient Care Team    Relationship Specialty Notifications Start End  Natalia Leatherwood, DO PCP - General Family Medicine  12/30/14   Charna Elizabeth, MD Consulting Physician Gastroenterology  05/09/14   Charmian Muff, LCAS (Inactive)  Psychology  10/16/15   Oletta Darter, MD  Psychiatry  12/12/16   Ladene Artist, MD Consulting Physician Oncology  04/23/20   Georgeanna Lea, MD Consulting Physician Cardiology  05/12/22     Chief Complaint  Patient presents with   Hypothyroidism     Subjective: Kendra Ball is a 76 y.o.  patient presents today to follow up on chronic conditions.   Hypothyroidism:  Pt reports compliance with levo 100 mcg daily.   Shared decision making visit for lung cancer screening: Patient was brought in today for a office visit concerning shared decision making for their lung cancer screening.Kendra Ball is a 77 y.o. female Patient is between the ages of 33-80: Yes Patient is a current smoker with at least 30 year pack year history or Patient is a former smoker, quit less than 15 years ago and has a 30 pack year history : Yes Patient has current symptoms: No Patient has a  health problem that substantially limits life expectancy or the ability or willingness to have curative lung surgery: No   06/03/2020 echo: 1. Left ventricular ejection fraction, by estimation, is 50 to 55%. The  left ventricle has low normal function. The left ventricle demonstrates  moderate hypokinesis in the basal to mid inferior wall and the remaining  myocardial walls segments with  global hypokinesis. Left ventricular diastolic parameters are  indeterminate.   2. Right ventricular systolic function is normal. The right ventricular  size is normal.   3. The mitral valve is normal in structure. Mild mitral valve  regurgitation. No evidence of mitral stenosis.   4. The aortic valve is normal in structure.  Aortic valve regurgitation is  not visualized. No aortic stenosis is present.   5. The inferior vena cava is normal in size with greater than 50%  respiratory variability, suggesting right atrial pressure of 3 mmHg.   05/20/2020 lexiscan: Nuclear stress EF: 51%. There was no ST segment deviation noted during stress. No T wave inversion was noted during stress. There is a small, partially reversible perfusion defect of moderate severity present in the apical anterior location. Findings consistent with prior myocardial infarction with peri-infarct ischemia. This is a low risk study. The left ventricular ejection fraction is mildly decreased (45-54%).   01/10/2022 carotid studies: Right Carotid: Velocities in the right ICA are consistent with a 60-79%                 stenosis.  Left Carotid: Velocities in the left ICA are consistent with a 40-59%  stenosis.     11/16/2022   10:47 AM 08/01/2022    1:52 PM 06/09/2022   10:18 AM 04/07/2022   10:47 AM 12/01/2021    2:42 PM  Depression screen PHQ 2/9  Decreased Interest 0 0   0  Down, Depressed, Hopeless 0 0   0  PHQ - 2 Score 0 0   0  Altered sleeping 0      Tired, decreased energy 2      Change in appetite 0      Feeling bad or failure about yourself  0      Trouble concentrating 0  Moving slowly or fidgety/restless 0      Suicidal thoughts 0      PHQ-9 Score 2         Information is confidential and restricted. Go to Review Flowsheets to unlock data.    Allergies  Allergen Reactions   Morphine Sulfate Swelling and Other (See Comments)    Swelling around injection area   Social History   Social History Narrative   Kendra Ball lives in [place] with her husband. She has 3 grown children and several grand children. She has joint custody of 3 of her grand children as her daughter has bipolar disorder and has needed assistance with children in past.   1-2 cups coffee, 3-6 cups tea per day.   Right-handed.   Past Medical  History:  Diagnosis Date   Abnormal mammogram 10/01/2013   Diagnostic [facility] & [place] confirm cyst & duct ectasia of L breast. 08/2013.    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 RF: smoker, fam Hx, sedentary HDL is high!!    Dyspnea on exertion 05/08/2020   Essential hypertension 05/22/2014   Family history of malignant neoplasm of gastrointestinal tract 05/05/2020   Family history of malignant neoplasm of gastrointestinal tract 05/05/2020   Family history of premature coronary heart disease 08/13/2013   Father, brother, sister Personal risk factors: smoker, female over 66, fam Hx, sedentary, thyroid disease, dyslipidemia, BMI over 25.    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     Insomnia 05/05/2015   Iron deficiency anemia 05/05/2020   Lymphoma (HCC) 2019   B-cell   MDD (major depressive disorder), recurrent episode, moderate (HCC) 01/30/2014   [facility] 05/2015 08/18/2015:  Effexor XR 225mg  po qD for depression and anxiety Wellbutrin SR to 200mg  po BID for depression   Xanax 0.5mg  po BID and 1mg  po qHS prn anxiety        Routine PRN Medications:  Yes    Consultations: encouraged to f/up with PCP as needed, encouraged to start individual therapy   Safety Concerns:  Pt denies SI and is at an acute low risk for suicide.Patient told to call clinic i   Multiple fractures 01/29/2019   Obesity    Osteoporosis    Paresthesia 08/06/2019   Port-A-Cath in place 12/27/2017   Pulmonary nodule seen on imaging study 02/13/2015   Smoker 09/14/2015   Substance abuse [facility])    inpatient and outpatient tx for substance abuse   TOBACCO USER 12/25/2009   Tremor 05/23/2019   Past Surgical History:  Procedure Laterality Date  AXILLARY LYMPH NODE BIOPSY Right 06/22/2017   Procedure: RIGHT AXILLARY LYMPH NODE BIOPSY;  Surgeon: Manus Rudd, MD;  Location: [facility];  Service: General;  Laterality: Right;   BARIATRIC SURGERY  2006   CESAREAN SECTION     COLON RESECTION N/A 05/09/2014   Procedure: LAPAROSCOPIC HAND-ASSISTED EXTENDED RIGHT COLECTOMY, LAPAROSCOPIC LYSIS OF ADHESIONS, SPLENIC FLEXURE MOBILIZATION.;  Surgeon: Karie Soda, MD;  Location: [facility];  Service: General;  Laterality: N/A;   DILATION AND CURETTAGE OF UTERUS     x2   FOREARM FRACTURE SURGERY     right   GASTRIC BYPASS  2006   IR IMAGING GUIDED PORT INSERTION  12/01/2017   IR REMOVAL TUN ACCESS W/ PORT W/O FL MOD SED  07/03/2018   MASS EXCISION Right 02/24/2017   Procedure: EXCISIONAL BIOPSY RIGHT SUPRA-CLAVICULAR NODE;  Surgeon: Flo Shanks, MD;  Location: [facility];  Service: ENT;  Laterality: Right;   TONSILLECTOMY     Family History  Problem Relation Age of Onset   Cancer Mother        colon   Depression Mother    Alzheimer's disease Mother        Dementia   Heart disease Father 44   Heart disease Sister 40       stents, pacemaker   Depression Sister    Heart disease Brother 109       CABG, MI   Depression Brother    Obesity Daughter    Suicidality Daughter    Bipolar disorder Daughter    Obesity Son    Mental illness Daughter         bipolar   Obesity Daughter    Depression Sister    Dementia Paternal Aunt    Alcohol abuse Maternal Uncle    Colon cancer Maternal Aunt    Stomach cancer Maternal Grandfather    Allergies as of 11/16/2022       Reactions   Morphine Sulfate Swelling, Other (See Comments)   Swelling around injection area        Medication List        Accurate as of November 16, 2022 10:59 AM. If you have any questions, ask your nurse or doctor.          albuterol 108 (90 Base) MCG/ACT inhaler Commonly known as: VENTOLIN HFA Inhale 2 puffs into the lungs every 6 (six) hours as needed for wheezing or shortness of breath.   aspirin EC 81 MG tablet Take 1 tablet (81 mg total) by mouth daily. Swallow whole.   buPROPion 150 MG 12 hr tablet Commonly known as: WELLBUTRIN SR Take 1 tablet (150 mg total) by mouth 2 (two) times daily.   busPIRone 15 MG tablet Commonly known as: BUSPAR Take 1 tablet (15 mg total) by mouth 3 (three) times daily.   clotrimazole 1 % cream Commonly known as: Clotrimazole Anti-Fungal Apply 1 Application topically 2 (two) times daily.   Cyanocobalamin 2500 MCG Subl 1 tab sublingual daily   denosumab 60 MG/ML Sosy injection Commonly known as: PROLIA Inject 60 mg into the skin every 6 (six) months.   ferrous fumarate 325 (106 Fe) MG Tabs tablet Commonly known as: HEMOCYTE - 106 mg FE Take 1 tablet by mouth daily. Reported on 10/06/2015   fluconazole 100 MG tablet Commonly known as: DIFLUCAN Take 1 tablet (100 mg total) by mouth daily.   levothyroxine 100 MCG tablet Commonly known as: SYNTHROID Take 1 tablet (100 mcg total) by mouth daily before  breakfast. OFFICE VISIT NEEDED FOR FURTHER REFILLS   Magnesium 250 MG Tabs Take 250 mg by mouth daily.   nicotine polacrilex 2 MG gum Commonly known as: NICORETTE Take 1 each (2 mg total) by mouth as needed for smoking cessation.   ranolazine 500 MG 12 hr tablet Commonly known as: RANEXA Take 1 tablet (500  mg total) by mouth 2 (two) times daily.   rosuvastatin 10 MG tablet Commonly known as: CRESTOR TAKE 1 TABLET BY MOUTH EVERY DAY   Symbicort 160-4.5 MCG/ACT inhaler Generic drug: budesonide-formoterol INHALE 2 PUFFS INTO THE LUNGS TWICE A DAY   traZODone 100 MG tablet Commonly known as: DESYREL Take 2 tablets (200 mg total) by mouth at bedtime.   venlafaxine XR 150 MG 24 hr capsule Commonly known as: EFFEXOR-XR Take 1 capsule (150 mg total) by mouth daily with breakfast.        All past medical history, surgical history, allergies, family history, immunizations andmedications were updated in the EMR today and reviewed under the history and medication portions of their EMR.     ROS Negative, with the exception of above mentioned in HPI   Objective:  BP 126/80   Pulse 66   Wt 168 lb 12.8 oz (76.6 kg)   SpO2 96%   BMI 29.90 kg/m  Body mass index is 29.9 kg/m. Physical Exam Vitals and nursing note reviewed.  Constitutional:      General: She is not in acute distress.    Appearance: Normal appearance. She is not ill-appearing, toxic-appearing or diaphoretic.  HENT:     Head: Normocephalic and atraumatic.  Eyes:     General: No scleral icterus.       Right eye: No discharge.        Left eye: No discharge.     Extraocular Movements: Extraocular movements intact.     Conjunctiva/sclera: Conjunctivae normal.     Pupils: Pupils are equal, round, and reactive to light.  Cardiovascular:     Rate and Rhythm: Normal rate and regular rhythm.  Pulmonary:     Effort: Pulmonary effort is normal. No respiratory distress.     Breath sounds: Normal breath sounds. No wheezing, rhonchi or rales.  Musculoskeletal:     Cervical back: Neck supple.     Right lower leg: No edema.     Left lower leg: No edema.  Skin:    General: Skin is warm.     Findings: No rash.  Neurological:     Mental Status: She is alert and oriented to person, place, and time. Mental status is at baseline.      Motor: No weakness.     Gait: Gait normal.  Psychiatric:        Mood and Affect: Mood normal.        Behavior: Behavior normal.        Thought Content: Thought content normal.        Judgment: Judgment normal.    No results found. No results found. No results found for this or any previous visit (from the past 24 hour(s)).  Assessment/Plan: Kendra Ball is a 77 y.o. female present for OV for  Hypothyroidism  Tsh collected today Continue levo 100 mcg every day. refills will be provided in appropriate dose based on lab result today  Dyslipidemia Lipids collected today  Age-related osteoporosis without current pathological fracture Completed 10/2021, rpt 2 yrs T score- -4.4 Supplementing vit d Diffuse large B-cell lymphoma, unspecified body region (HCC) - CBC w/Diff  Smoking greater than 20 pack years/ Encounter for screening for lung cancer - CT CHEST LUNG CA SCREEN LOW DOSE W/O CM; Future -Patient was counseled on lung cancer screening today.  Patient does meet criteria for lung cancer screening.  Patient would like to proceed with lung cancer screening.  Patient understands screening may warrant further studies or repeat studies if any abnormality is found.  Patient is agreeable. -Patient has had a recent kidney function test:Yes - CT CHEST LUNG CA SCREEN LOW DOSE W/O CM; Future - Follow-up upon screening results.  Reviewed expectations re: course of current medical issues. Discussed self-management of symptoms. Outlined signs and symptoms indicating need for more acute intervention. Patient verbalized understanding and all questions were answered. Patient received an After-Visit Summary.    Orders Placed This Encounter  Procedures   CBC w/Diff   TSH   Vitamin D (25 hydroxy)   Lipid panel   No orders of the defined types were placed in this encounter.  Referral Orders  No referral(s) requested today     Note is dictated utilizing voice recognition  software. Although note has been proof read prior to signing, occasional typographical errors still can be missed. If any questions arise, please do not hesitate to call for verification.   electronically signed by:  Felix Pacini, DO  [facility] - OR

## 2022-11-16 NOTE — Patient Instructions (Addendum)
Return in about 1 year (around 11/13/2023) for Routine chronic condition follow-up.        Great to see you today.  I have refilled the medication(s) we provide.   If labs were collected, we will inform you of lab results once received either by echart message or telephone call.   - echart message- for normal results that have been seen by the patient already.   - telephone call: abnormal results or if patient has not viewed results in their echart.

## 2022-11-17 ENCOUNTER — Other Ambulatory Visit: Payer: Self-pay | Admitting: Family Medicine

## 2022-11-17 MED ORDER — LEVOTHYROXINE SODIUM 100 MCG PO TABS: ORAL_TABLET | ORAL | 3 refills | Status: DC

## 2022-11-20 ENCOUNTER — Other Ambulatory Visit: Payer: Self-pay | Admitting: Cardiology

## 2022-11-23 ENCOUNTER — Encounter (INDEPENDENT_AMBULATORY_CARE_PROVIDER_SITE_OTHER): Payer: Self-pay

## 2022-11-25 ENCOUNTER — Inpatient Hospital Stay: Payer: Medicare PPO | Attending: Oncology | Admitting: Oncology

## 2022-11-25 VITALS — BP 130/60 | HR 81 | Temp 98.2°F | Resp 18 | Ht 63.0 in | Wt 171.2 lb

## 2022-11-25 DIAGNOSIS — R21 Rash and other nonspecific skin eruption: Secondary | ICD-10-CM | POA: Diagnosis not present

## 2022-11-25 DIAGNOSIS — Z79899 Other long term (current) drug therapy: Secondary | ICD-10-CM | POA: Insufficient documentation

## 2022-11-25 DIAGNOSIS — Z800 Family history of malignant neoplasm of digestive organs: Secondary | ICD-10-CM | POA: Insufficient documentation

## 2022-11-25 DIAGNOSIS — E7800 Pure hypercholesterolemia, unspecified: Secondary | ICD-10-CM | POA: Insufficient documentation

## 2022-11-25 DIAGNOSIS — I10 Essential (primary) hypertension: Secondary | ICD-10-CM | POA: Diagnosis not present

## 2022-11-25 DIAGNOSIS — C8338 Diffuse large B-cell lymphoma, lymph nodes of multiple sites: Secondary | ICD-10-CM | POA: Insufficient documentation

## 2022-11-25 DIAGNOSIS — Z808 Family history of malignant neoplasm of other organs or systems: Secondary | ICD-10-CM | POA: Diagnosis not present

## 2022-11-25 DIAGNOSIS — E039 Hypothyroidism, unspecified: Secondary | ICD-10-CM | POA: Diagnosis not present

## 2022-11-25 NOTE — Progress Notes (Signed)
[facility] OFFICE PROGRESS NOTE   Diagnosis: Non-Hodgkin's lymphoma  INTERVAL HISTORY:   Kendra Ball returns as scheduled.  She generally feels well.  Good appetite.  The yeast rash improved with Diflucan and cornstarch.  No palpable lymph nodes.  No fever or night sweats.  She has exertional dyspnea.  She is scheduled for a lung cancer screening chest CT next week.  Objective:  Vital signs in last 24 hours:  Blood pressure 130/60, pulse 81, temperature 98.2 F (36.8 C), temperature source Oral, resp. rate 18, height 5\' 3"  (1.6 m), weight 171 lb 3.2 oz (77.7 kg), SpO2 96%.    HEENT: Neck without mass Lymphatics: No cervical, supraclavicular, axillary, or inguinal nodes Resp: Scattered end inspiratory rhonchi and expiratory wheeze, no respiratory distress Cardio: Regular rate and rhythm GI: No hepatosplenomegaly, nontender, no mass Vascular: No leg edema  Skin: Mild yeast rash at the breast skin fold bilaterally, dryness at the right areola without nodularity mild yeast rash in the bilateral groin  Lab Results:  Lab Results  Component Value Date   WBC 7.4 11/16/2022   HGB 14.1 11/16/2022   HCT 43.9 11/16/2022   MCV 103.8 (H) 11/16/2022   PLT 237.0 11/16/2022   NEUTROABS 5.2 11/16/2022    CMP  Lab Results  Component Value Date   NA 140 11/16/2022   K 3.9 11/16/2022   CL 104 11/16/2022   CO2 25 11/16/2022   GLUCOSE 95 11/16/2022   BUN 11 11/16/2022   CREATININE 0.69 11/16/2022   CALCIUM 9.1 11/16/2022   PROT 6.1 05/12/2022   ALBUMIN 3.9 05/12/2022   AST 20 05/12/2022   ALT 17 05/12/2022   ALKPHOS 39 05/12/2022   BILITOT 0.6 05/12/2022   GFRNONAA >60 07/03/2018   GFRAA >60 07/03/2018     Medications: I have reviewed the patient's current medications.   Assessment/Plan: Non-Hodgkin's lymphoma, diffuse large B-cell lymphoma, CD20 positive Ultrasound of the neck 02/18/2017- right supraclavicular adenopathy with at least 3 nodes, largest  measuring 1.3 x 1.1 x 1.4 cm; 2 adjacent hypoechoic right occipital lymph nodes Excisional biopsy right supraclavicular lymphadenopathy 02/24/2017 (Dr. Vergie Living lymphoid proliferation.  Necrotic soft tissue with associated acute and chronic inflammation.  By flow cytometry no monoclonal B-cell or phenotypically aberrant T-cell population.  Special stains for microorganisms including AFB, GMS, PAS and Warthin-Starry, are negative. CT chest 05/30/2017- multiple enlarged right axillary lymph nodes.  Largest right axillary lymph node measures 2.6 x 2.2 cm.  Single enlarged left axillary lymph node measuring 16 mm. CT neck 06/03/2017- known bilateral axillary lymphadenopathy partially covered on the scan.  No lymphadenopathy seen in the neck. Biopsy right axillary lymph node 06/22/2017 (Dr. Corliss Skains)- fibroadipose tissue with necrosis, chronic inflammation and granulation tissue PET scan 07/19/2017- hypermetabolic bilateral axillary lymph nodes.  Hypermetabolic but small right supraclavicular lymph nodes.  In the right omentum there is a small focus of rim density and internal fat density which is hypermetabolic. CT neck 10/25/2017- progressed bilateral subclavian lymphadenopathy since February, right greater than left.  Conspicuous enlargement of the small lymph nodes at the right level 2 and 3 nodal stations. CT chest/abdomen/pelvis 10/25/2017- progressive adenopathy identified within the left axilla, bilateral supraclavicular stations and left inguinal region.  Persistent but improved right axillary adenopathy.  Scattered indeterminate low-attenuation foci within the spleen not seen on study from 05/30/2017. Excisional biopsy of right supraclavicular and posterior triangle lymph nodes on 11/16/2017-diffuse large B-cell lymphoma, CD20 positive PET scan 11/28/2017- new hypermetabolic lymph nodes and progressive hypermetabolism  in existing lymph nodes.  Hypermetabolic focus along the anterior aspect of the right scapula  with adjacent soft tissue mass.  No new adenopathy involving the mediastinal or hilar regions of the chest or the abdomen or pelvis.  No obvious involvement of the liver or spleen.  IPI score 3 Cycle 1 CHOP/Rituxan 12/04/2017 Cycle 2 CHOP/Rituxan 12/27/2017 Cycle 3 CHOP/Rituxan 01/16/2018  Cycle 4 CHOP/Rituxan 02/05/2018 PET 02/19/2018- resolution of previously noted hypermetabolic adenopathy, no residual hypermetabolic osseous lesions Cycle 5 CHOP/rituximab 02/27/2018 Cycle 6 CHOP/rituximab 03/20/2018 Hypertension  Hypothyroid Hypercholesterolemia Family history significant for colon cancer, stomach cancer and brain cancer Ongoing tobacco use Port-A-Cath placement 12/01/2017, Interventional Radiology Left arm edema-negative venous Doppler 02/26/2018 Yeast rash at the bilateral breast and groin skin folds-Diflucan 08/25/2022        Disposition: Kendra Ball remains in clinical remission from non-Hodgkin's lymphoma.  She is now 5 years out from diagnosis.  She has a good prognosis for long-term disease-free survival.  Kendra Ball would like to be discharged from the oncology clinic.  She will continue clinical follow-up with Dr.Kuneff.  She will also follow-up with Dr.Kuneff for management of the right areola skin changes and COPD.  I am available to see her in the future as needed.  Thornton Papas, MD  11/25/2022  10:00 AM

## 2022-12-02 ENCOUNTER — Encounter: Payer: Self-pay | Admitting: Behavioral Health

## 2022-12-02 ENCOUNTER — Ambulatory Visit (INDEPENDENT_AMBULATORY_CARE_PROVIDER_SITE_OTHER): Payer: Medicare PPO | Admitting: Behavioral Health

## 2022-12-02 VITALS — BP 132/80 | Ht 63.5 in | Wt 169.0 lb

## 2022-12-02 DIAGNOSIS — F411 Generalized anxiety disorder: Secondary | ICD-10-CM | POA: Diagnosis not present

## 2022-12-02 DIAGNOSIS — F331 Major depressive disorder, recurrent, moderate: Secondary | ICD-10-CM | POA: Diagnosis not present

## 2022-12-02 NOTE — Progress Notes (Signed)
[facility] MD/PA/NP Initial Note  12/02/2022 1:30 PM GERAL WENSTROM  MRN:  [id number]  Chief Complaint:  Chief Complaint   Establish Care; Medication Refill; Patient Education; Anxiety     HPI:   "Kendra Ball", 77 year old female presents to this office for initial visit and to establish care.  Collateral information should be considered reliable.  Patient states that she was previously seeing Dr. Oletta Darter who is now retiring.  Says that she needs a new provider to assist in managing her psychiatric medication.  Dr. Michae Kava offered to provide patient with bridge on medications.  Patient says that she has a long history of anxiety and depression stemming back 20 years.  She said that she struggled with alcohol abuse approximately 5 years ago and went through rehab at [facility] [place].  Said the program was life-changing and she has not had any EtOH since 2017.  She does still endorse heavy cigarette smoking approximately 1 pack/day.  Says that she is doing well and very stable at this time.  She does not want to make any changes to her current medication regimen.  Says that her anxiety is 3/10, and depression is 2/10.  She is sleeping 7 to 8 hours per night with the aid of trazodone.  She endorses previous racing thoughts and risky or fullest behaviors in the past.  She denies any history of mania, no psychosis, no auditory or visual hallucinations or delirium.  Denies SI or HI.  She is not requesting refills at this time.  Prior psychiatric medication trials: Xanax-patient previously acknowledged abusing the medication so it was stopped 5 years ago.  Recommended that she not utilize any controlled substance. Sertraline Effexor Trazodone Wellbutrin    Visit Diagnosis:    ICD-10-CM   1. Generalized anxiety disorder  F41.1     2. Major depressive disorder, recurrent episode, moderate (HCC)  F33.1       Past Psychiatric History: MDD, Anxiety  Past Medical History:  Past Medical  History:  Diagnosis Date   Abnormal mammogram 10/01/2013   Diagnostic [facility] & [place] confirm cyst & duct ectasia of L breast. 08/2013.    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 RF: smoker, fam Hx, sedentary HDL is high!!    Dyspnea on exertion 05/08/2020   Essential hypertension 05/22/2014   Family history of malignant neoplasm of gastrointestinal tract 05/05/2020   Family history of malignant neoplasm of gastrointestinal tract 05/05/2020   Family history of premature coronary heart disease 08/13/2013   Father, brother, sister Personal risk factors: smoker, female over 58, fam Hx, sedentary, thyroid disease, dyslipidemia, BMI over 25.    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     Insomnia 05/05/2015   Iron deficiency anemia 05/05/2020   Lymphoma (HCC) 2019   B-cell   MDD (major depressive disorder), recurrent episode, moderate (HCC) 01/30/2014   [facility] 05/2015 08/18/2015:  Effexor XR 225mg  po qD for depression and anxiety Wellbutrin SR to 200mg  po BID for depression   Xanax 0.5mg  po BID and 1mg  po qHS prn anxiety        Routine PRN Medications:  Yes    Consultations: encouraged to f/up with PCP as needed, encouraged to start individual therapy   Safety Concerns:  Pt denies SI and is at an acute low risk for suicide.Patient told to call clinic i   Multiple fractures 01/29/2019   Obesity    Osteoporosis    Paresthesia 08/06/2019   Port-A-Cath in place 12/27/2017   Pulmonary nodule seen on imaging study 02/13/2015   Smoker 09/14/2015   Substance abuse [facility])    inpatient and outpatient tx for substance abuse   TOBACCO USER 12/25/2009   Tremor 05/23/2019    Past Surgical History:  Procedure Laterality Date   AXILLARY LYMPH NODE BIOPSY Right 06/22/2017   Procedure: RIGHT AXILLARY LYMPH NODE BIOPSY;  Surgeon: Manus Rudd, MD;  Location: [facility];  Service: General;  Laterality: Right;   BARIATRIC SURGERY  2006   CESAREAN SECTION     COLON RESECTION N/A 05/09/2014   Procedure: LAPAROSCOPIC HAND-ASSISTED EXTENDED RIGHT COLECTOMY, LAPAROSCOPIC LYSIS OF ADHESIONS, SPLENIC FLEXURE MOBILIZATION.;  Surgeon: Karie Soda, MD;  Location: [facility];  Service: General;  Laterality: N/A;   DILATION AND CURETTAGE OF UTERUS     x2   FOREARM FRACTURE SURGERY     right   GASTRIC BYPASS  2006   IR IMAGING GUIDED PORT INSERTION  12/01/2017   IR REMOVAL TUN ACCESS W/ PORT W/O FL MOD SED  07/03/2018   MASS EXCISION Right 02/24/2017   Procedure: EXCISIONAL BIOPSY RIGHT SUPRA-CLAVICULAR NODE;  Surgeon: Flo Shanks, MD;  Location: [facility];  Service: ENT;  Laterality: Right;   TONSILLECTOMY      Family Psychiatric History: see chart  Family History:  Family History  Problem Relation Age of Onset   Cancer Mother        colon   Depression Mother    Alzheimer's disease Mother        Dementia   Heart disease Father 80   Heart disease Sister 40       stents, pacemaker   Depression Sister    Heart disease Brother 75       CABG, MI   Depression Brother    Obesity Daughter    Suicidality Daughter    Bipolar  disorder Daughter    Obesity Son    Mental illness Daughter        bipolar   Obesity Daughter    Depression Sister    Dementia Paternal Aunt    Alcohol abuse Maternal Uncle    Colon cancer Maternal Aunt    Stomach cancer Maternal Grandfather     Social History:  Social History   Socioeconomic History   Marital status: Married    Spouse name: Genevie Cheshire   Number of children: 3   Years of education: Not on file   Highest education level: Some college, no degree  Occupational History   Occupation: RETIRED    Employer: [employer]  Tobacco Use   Smoking status: Every Day    Current packs/day: 1.00    Average packs/day: 1 pack/day for 50.0 years (50.0 ttl pk-yrs)    Types: Cigarettes   Smokeless tobacco: Never   Tobacco comments:    9 cigs/day on 05/06/2021. Back up to 1ppd in 03/2022  Vaping Use   Vaping status: Never Used  Substance and Sexual Activity   Alcohol use: No    Comment: quit on Aug 27, 2015   Drug use: No    Comment: last used May 2017   Sexual activity: Not Currently    Partners: Male    Birth control/protection: Post-menopausal  Other Topics Concern   Not on file  Social History Narrative   Ms. Battistone lives in [place] with her husband. She has 3 grown children and several grand children. She has joint custody of 3 of her grand children as her daughter has bipolar disorder and has needed assistance with children in past.   1-2 cups coffee, 3-6 cups tea per day.   Right-handed.   Social Determinants of Health   Financial Resource Strain: Low Risk  (11/16/2022)   Overall Financial Resource Strain (CARDIA)    Difficulty of Paying Living Expenses: Not hard at all  Food Insecurity: No Food Insecurity (11/16/2022)   Hunger Vital Sign    Worried About Running Out of Food in the Last Year: Never true    Ran Out of Food in the Last Year: Never true  Transportation Needs: No Transportation Needs (11/16/2022)   PRAPARE - Scientist, research (physical sciences) (Medical): No    Lack of Transportation (Non-Medical): No  Physical Activity: Insufficiently Active (11/16/2022)   Exercise Vital Sign    Days of Exercise per Week: 2 days    Minutes of Exercise per Session: 30 min  Stress: No Stress Concern Present (11/16/2022)   [employer] of Occupational Health - Occupational Stress Questionnaire    Feeling of Stress : Not at all  Social Connections: Moderately Isolated (11/16/2022)   Social Connection and Isolation Panel [NHANES]    Frequency of Communication with Friends and Family: More than three times a week    Frequency of Social Gatherings with Friends and Family: Three times a week    Attends Religious Services: Never    Active Member of Clubs or Organizations: No    Attends Banker Meetings: Not on file    Marital Status: Married    Allergies:  Allergies  Allergen Reactions   Morphine Sulfate Swelling and Other (See Comments)    Swelling around injection area    Metabolic Disorder Labs: Lab Results  Component Value Date   HGBA1C 5.0 08/06/2019   MPG 111 05/10/2014   No results found for: "PROLACTIN" Lab Results  Component Value Date   CHOL 148 11/16/2022   TRIG 81.0 11/16/2022   HDL 78.90 11/16/2022   CHOLHDL 2 11/16/2022   VLDL 16.2 11/16/2022   LDLCALC 53 11/16/2022   LDLCALC 61 03/24/2020   Lab Results  Component Value Date   TSH 0.31 (L) 11/16/2022   TSH 0.65 05/12/2022    Therapeutic Level Labs: No results found for: "LITHIUM" No results found for: "VALPROATE" No results found for: "CBMZ"  Current Medications: Current Outpatient Medications  Medication Sig Dispense Refill   albuterol (VENTOLIN HFA) 108 (90 Base) MCG/ACT inhaler Inhale 2 puffs into the lungs every 6 (six) hours as needed for wheezing or shortness of breath. 8 g 11  aspirin EC 81 MG tablet Take 1 tablet (81 mg total) by mouth daily. Swallow whole. 90 tablet 3   buPROPion (WELLBUTRIN SR) 150 MG 12 hr tablet Take  1 tablet (150 mg total) by mouth 2 (two) times daily. 180 tablet 1   busPIRone (BUSPAR) 15 MG tablet Take 1 tablet (15 mg total) by mouth 3 (three) times daily. 270 tablet 1   Cyanocobalamin 2500 MCG SUBL 1 tab sublingual daily 90 tablet 3   denosumab (PROLIA) 60 MG/ML SOSY injection Inject 60 mg into the skin every 6 (six) months.     ferrous fumarate (HEMOCYTE - 106 MG FE) 325 (106 FE) MG TABS Take 1 tablet by mouth daily. Reported on 10/06/2015     levothyroxine (SYNTHROID) 100 MCG tablet 1 tab PO 6 days a week and 1/2 tab on Sunday. 90 tablet 3   Magnesium 250 MG TABS Take 250 mg by mouth daily.      nicotine polacrilex (NICORETTE) 2 MG gum Take 1 each (2 mg total) by mouth as needed for smoking cessation. (Patient not taking: Reported on 11/25/2022) 100 tablet 2   ranolazine (RANEXA) 500 MG 12 hr tablet Take 1 tablet (500 mg total) by mouth 2 (two) times daily. 180 tablet 1   rosuvastatin (CRESTOR) 10 MG tablet Take 1 tablet (10 mg total) by mouth daily. 90 tablet 1   SYMBICORT 160-4.5 MCG/ACT inhaler INHALE 2 PUFFS INTO THE LUNGS TWICE A DAY 10.2 each 3   traZODone (DESYREL) 100 MG tablet Take 2 tablets (200 mg total) by mouth at bedtime. 180 tablet 1   venlafaxine XR (EFFEXOR-XR) 150 MG 24 hr capsule Take 1 capsule (150 mg total) by mouth daily with breakfast. 90 capsule 1   No current facility-administered medications for this visit.    Medication Side Effects: none  Orders placed this visit:  No orders of the defined types were placed in this encounter.   Psychiatric Specialty Exam:  Review of Systems  Constitutional: Negative.   Allergic/Immunologic: Positive for environmental allergies.  Psychiatric/Behavioral: Negative.      Blood pressure 132/80, height 5' 3.5" (1.613 m), weight 169 lb (76.7 kg).Body mass index is 29.47 kg/m.  General Appearance: Casual and Neat  Eye Contact:  Good  Speech:  Clear and Coherent  Volume:  Normal  Mood:  Anxious, Depressed, and Dysphoric   Affect:  Constricted, Depressed, and Anxious  Thought Process:  Coherent  Orientation:  Full (Time, Place, and Person)  Thought Content: Logical   Suicidal Thoughts:  No  Homicidal Thoughts:  No  Memory:  WNL  Judgement:  Good  Insight:  Good  Psychomotor Activity:  Normal  Concentration:  Concentration: Good  Recall:  Good  Fund of Knowledge: Good  Language: Good  Assets:  Desire for Improvement  ADL's:  Intact  Cognition: WNL  Prognosis:  Good   Screenings:  GAD-7    Flowsheet Row Office Visit from 09/17/2021 in [facility] Visit from 01/19/2021 in [facility] [place] Office Visit from 04/10/2018 in [facility]  Total GAD-7 Score 2 1 5       PHQ2-9    Flowsheet Row Office Visit from 11/16/2022 in [facility] [place] Office Visit from 08/01/2022 in [facility] [place] [facility] Clinical Support from 12/01/2021 in [facility] [place] [facility] [place] Office Visit from 09/17/2021 in [facility] Office Visit from 01/19/2021 in [facility] Total Score 0 0 0 0 0  PHQ-9 Total Score 2 -- -- 2 2      Flowsheet Row ED from 09/27/2020 in [facility] Emergency Department at [facility]  C-SSRS RISK CATEGORY No Risk       Receiving Psychotherapy: No   Treatment Plan/Recommendations:   Greater than 50% of 60 min face to face time with patient was spent on counseling and coordination of care. We discussed her long history of anxiety and depression stemming back 20 years.  We also discussed her previous ETOH abuse as well as Xanax approximately 5 years ago.  She denies any further problems with substances.  We discussed her previous medications as well as plan of care.  She is agreeing to cooperate with me in a therapeutic relationship to manage her medications.   We agreed  today to:  Will continue Effexor 150 mg XR daily Will continue buspirone 15 mg 3 times daily Will continue Wellbutrin 150 mg SR daily Will continue trazodone 200 mg at bedtime daily To follow-up in 2 months to reassess Provided emergency contact information Will report worsening symptoms or side effects promptly Reviewed PDMP   Joan Flores, NP

## 2022-12-03 ENCOUNTER — Ambulatory Visit (HOSPITAL_BASED_OUTPATIENT_CLINIC_OR_DEPARTMENT_OTHER)
Admission: RE | Admit: 2022-12-03 | Discharge: 2022-12-03 | Disposition: A | Discharge status: Home / Self Care | Payer: Medicare PPO | Source: Ambulatory Visit | Attending: Family Medicine | Admitting: Family Medicine

## 2022-12-03 DIAGNOSIS — F17210 Nicotine dependence, cigarettes, uncomplicated: Secondary | ICD-10-CM

## 2022-12-03 DIAGNOSIS — Z122 Encounter for screening for malignant neoplasm of respiratory organs: Secondary | ICD-10-CM | POA: Diagnosis not present

## 2022-12-09 ENCOUNTER — Telehealth: Payer: Self-pay | Admitting: Family Medicine

## 2022-12-09 DIAGNOSIS — R911 Solitary pulmonary nodule: Secondary | ICD-10-CM

## 2022-12-09 NOTE — Telephone Encounter (Signed)
Please call patient Kendra Ball have received the results from her lung cancer screening image. Her prior pulmonary nodules are all stable. She does have 1 new pulmonary nodule, though looks "likely benign, "by radiology reading.  However short-term follow-up is recommended this time to be sure no changes in new nodule occur.   -Please schedule patient for pulmonary nodule follow-up early February with provider and Kendra Ball will order the repeat CT at that time.

## 2022-12-09 NOTE — Telephone Encounter (Signed)
Pt made aware and appointment canceled. Pt will call back to schedule.

## 2022-12-09 NOTE — Telephone Encounter (Signed)
Patient to be scheduled in February 2025 (not September)

## 2022-12-12 ENCOUNTER — Ambulatory Visit: Payer: Medicare PPO

## 2022-12-14 ENCOUNTER — Ambulatory Visit (INDEPENDENT_AMBULATORY_CARE_PROVIDER_SITE_OTHER): Payer: Medicare PPO

## 2022-12-14 VITALS — Wt 169.0 lb

## 2022-12-14 DIAGNOSIS — Z0000 Encounter for general adult medical examination without abnormal findings: Secondary | ICD-10-CM | POA: Diagnosis not present

## 2022-12-14 NOTE — Progress Notes (Signed)
Subjective:   Kendra Ball is a 77 y.o. female who presents for Medicare Annual (Subsequent) preventive examination.  Visit Complete: Virtual  I connected with  Kendra Ball on 12/14/22 by a audio enabled telemedicine application and verified that I am speaking with the correct person using two identifiers.  Patient Location: Home  Provider Location: Home Office  I discussed the limitations of evaluation and management by telemedicine. The patient expressed understanding and agreed to proceed.  Patient Medicare AWV questionnaire was completed by the patient on 12/13/22; I have confirmed that all information answered by patient is correct and no changes since this date.  Vital Signs: Unable to obtain new vitals due to this being a telehealth visit.   Review of Systems     Cardiac Risk Factors include: advanced age (>83men, >27 women);dyslipidemia;hypertension     Objective:    Today's Vitals   12/14/22 1418  Weight: 169 lb (76.7 kg)   Body mass index is 29.47 kg/m.     12/14/2022    2:22 PM 11/25/2022   10:02 AM 08/25/2022   10:17 AM 03/10/2022   11:13 AM 12/01/2021    2:43 PM 09/07/2021   10:49 AM 01/27/2021    8:55 AM  Advanced Directives  Does Patient Have a Medical Advance Directive? No No No No Yes No No  Does patient want to make changes to medical advance directive?     Yes (MAU/Ambulatory/Procedural Areas - Information given)    Would patient like information on creating a medical advance directive? No - Patient declined   Yes (MAU/Ambulatory/Procedural Areas - Information given)  No - Patient declined No - Patient declined    Current Medications (verified) Outpatient Encounter Medications as of 12/14/2022  Medication Sig   albuterol (VENTOLIN HFA) 108 (90 Base) MCG/ACT inhaler Inhale 2 puffs into the lungs every 6 (six) hours as needed for wheezing or shortness of breath.   aspirin EC 81 MG tablet Take 1 tablet (81 mg total) by mouth daily. Swallow  whole.   buPROPion (WELLBUTRIN SR) 150 MG 12 hr tablet Take 1 tablet (150 mg total) by mouth 2 (two) times daily.   busPIRone (BUSPAR) 15 MG tablet Take 1 tablet (15 mg total) by mouth 3 (three) times daily.   Cyanocobalamin 2500 MCG SUBL 1 tab sublingual daily   denosumab (PROLIA) 60 MG/ML SOSY injection Inject 60 mg into the skin every 6 (six) months.   ferrous fumarate (HEMOCYTE - 106 MG FE) 325 (106 FE) MG TABS Take 1 tablet by mouth daily. Reported on 10/06/2015   levothyroxine (SYNTHROID) 100 MCG tablet 1 tab PO 6 days a week and 1/2 tab on Sunday.   Magnesium 250 MG TABS Take 250 mg by mouth daily.    ranolazine (RANEXA) 500 MG 12 hr tablet Take 1 tablet (500 mg total) by mouth 2 (two) times daily.   rosuvastatin (CRESTOR) 10 MG tablet Take 1 tablet (10 mg total) by mouth daily.   SYMBICORT 160-4.5 MCG/ACT inhaler INHALE 2 PUFFS INTO THE LUNGS TWICE A DAY   traZODone (DESYREL) 100 MG tablet Take 2 tablets (200 mg total) by mouth at bedtime.   venlafaxine XR (EFFEXOR-XR) 150 MG 24 hr capsule Take 1 capsule (150 mg total) by mouth daily with breakfast.   No facility-administered encounter medications on file as of 12/14/2022.    Allergies (verified) Morphine sulfate   History: Past Medical History:  Diagnosis Date   Abnormal mammogram 10/01/2013   Diagnostic [facility] & [place]  confirm cyst & duct ectasia of L breast. 08/2013.    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 RF: smoker, fam Hx, sedentary HDL is high!!    Dyspnea on exertion 05/08/2020   Essential hypertension 05/22/2014   Family history of malignant neoplasm of gastrointestinal tract 05/05/2020   Family history of malignant neoplasm of gastrointestinal tract 05/05/2020   Family history of premature coronary heart disease 08/13/2013   Father, brother, sister Personal risk factors: smoker, female over 9, fam Hx, sedentary, thyroid disease, dyslipidemia, BMI over 25.    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     Insomnia 05/05/2015   Iron deficiency anemia 05/05/2020   Lymphoma (HCC) 2019   B-cell   MDD (major depressive disorder), recurrent episode, moderate (HCC) 01/30/2014   [facility] 05/2015 08/18/2015:  Effexor XR 225mg  po qD for depression and anxiety Wellbutrin SR to 200mg  po BID for depression   Xanax 0.5mg  po BID and 1mg  po qHS prn anxiety       Routine PRN Medications:  Yes    Consultations: encouraged to f/up with PCP as needed, encouraged to start individual therapy   Safety Concerns:  Pt denies SI and is at an acute low risk for suicide.Patient told to call clinic i   Multiple fractures 01/29/2019   Obesity    Osteoporosis    Paresthesia 08/06/2019   Port-A-Cath in place 12/27/2017   Pulmonary nodule seen on imaging study 02/13/2015   Smoker 09/14/2015   Substance abuse [facility])    inpatient and outpatient tx for substance abuse   TOBACCO USER 12/25/2009   Tremor 05/23/2019   Past Surgical History:  Procedure Laterality Date   AXILLARY LYMPH NODE BIOPSY Right 06/22/2017   Procedure: RIGHT AXILLARY LYMPH NODE BIOPSY;  Surgeon:  Manus Rudd, MD;  Location: [facility];  Service: General;  Laterality: Right;   BARIATRIC SURGERY  2006   CESAREAN SECTION     COLON RESECTION N/A 05/09/2014   Procedure: LAPAROSCOPIC HAND-ASSISTED EXTENDED RIGHT COLECTOMY, LAPAROSCOPIC LYSIS OF ADHESIONS, SPLENIC FLEXURE MOBILIZATION.;  Surgeon: Karie Soda, MD;  Location: [facility];  Service: General;  Laterality: N/A;   DILATION AND CURETTAGE OF UTERUS     x2   FOREARM FRACTURE SURGERY     right   GASTRIC BYPASS  2006   IR IMAGING GUIDED PORT INSERTION  12/01/2017   IR REMOVAL TUN ACCESS W/ PORT W/O FL MOD SED  07/03/2018   MASS EXCISION Right 02/24/2017   Procedure: EXCISIONAL BIOPSY RIGHT SUPRA-CLAVICULAR NODE;  Surgeon: Flo Shanks,  MD;  Location: [facility];  Service: ENT;  Laterality: Right;   TONSILLECTOMY     Family History  Problem Relation Age of Onset   Cancer Mother        colon   Depression Mother    Alzheimer's disease Mother        Dementia   Heart disease Father 62   Heart disease Sister 40       stents, pacemaker   Depression Sister    Heart disease Brother 28       CABG, MI   Depression Brother    Obesity Daughter    Suicidality Daughter    Bipolar disorder Daughter    Obesity Son    Mental illness Daughter        bipolar   Obesity Daughter    Depression Sister    Dementia Paternal Aunt    Alcohol abuse Maternal Uncle    Colon cancer Maternal Aunt    Stomach cancer Maternal Grandfather    Social History   Socioeconomic History   Marital status: Married    Spouse name: Therapist, nutritional   Number of children: 3   Years of education: Not on file   Highest education level: Some college, no degree  Occupational History   Occupation: RETIRED    Employer: [employer]  Tobacco Use   Smoking status: Every Day    Current packs/day: 1.00    Average packs/day: 1 pack/day for 108.6 years (108.6 ttl pk-yrs)    Types: Cigarettes    Start date: 1966   Smokeless tobacco: Never   Tobacco comments:     9 cigs/day on 05/06/2021. Back up to 1ppd in 03/2022  Vaping Use   Vaping status: Never Used  Substance and Sexual Activity   Alcohol use: No    Comment: quit on Aug 27, 2015   Drug use: No    Comment: last used May 2017   Sexual activity: Not Currently    Partners: Male    Birth control/protection: Post-menopausal  Other Topics Concern   Not on file  Social History Narrative   Ms. Kozloski lives in [place] with her husband. She has 3 grown children and several grand children. She has joint custody of 3 of her grand children as her daughter has bipolar disorder and has needed assistance with children in past.   1-2 cups coffee, 3-6 cups tea per day.   Right-handed.   Social Determinants of Health   Financial Resource Strain: Low Risk  (12/13/2022)   Overall Financial Resource Strain (CARDIA)    Difficulty of Paying Living Expenses: Not hard at all  Food Insecurity: No Food Insecurity (12/13/2022)   Hunger Vital Sign    Worried About Running Out of Food in the Last Year: Never true    Ran Out of Food in the Last Year: Never true  Transportation Needs: No Transportation Needs (12/13/2022)   PRAPARE - Administrator, Civil Service (Medical): No    Lack of Transportation (Non-Medical): No  Physical Activity: Insufficiently Active (12/13/2022)   Exercise Vital Sign    Days of Exercise per Week: 1 day    Minutes of Exercise per Session: 20 min  Stress: No Stress Concern Present (12/13/2022)   [employer] of Occupational Health - Occupational Stress Questionnaire    Feeling of Stress : Only a little  Social Connections: Moderately Isolated (12/13/2022)   Social Connection and Isolation Panel [NHANES]    Frequency of Communication with  Friends and Family: More than three times a week    Frequency of Social Gatherings with Friends and Family: Twice a week    Attends Religious Services: Never    Database administrator or Organizations: No    Attends Museum/gallery exhibitions officer: Patient declined    Marital Status: Married    Tobacco Counseling Ready to quit: Not Answered Counseling given: Not Answered Tobacco comments: 9 cigs/day on 05/06/2021. Back up to 1ppd in 03/2022   Clinical Intake:  Pre-visit preparation completed: Yes  Pain : No/denies pain     BMI - recorded: 29.47  How often do you need to have someone help you when you read instructions, pamphlets, or other written materials from your doctor or pharmacy?: 1 - Never  Interpreter Needed?: No  Information entered by :: Lanier Ensign, LPN   Activities of Daily Living    12/13/2022   11:03 AM  In your present state of health, do you have any difficulty performing the following activities:  Hearing? 0  Vision? 1  Difficulty concentrating or making decisions? 0  Walking or climbing stairs? 1  Dressing or bathing? 0  Doing errands, shopping? 0  Preparing Food and eating ? N  Using the Toilet? N  In the past six months, have you accidently leaked urine? N  Do you have problems with loss of bowel control? Y  Managing your Medications? N  Managing your Finances? N  Housekeeping or managing your Housekeeping? N    Patient Care Team: Natalia Leatherwood, DO as PCP - General (Family Medicine) Charna Elizabeth, MD as Consulting Physician (Gastroenterology) Charmian Muff, LCAS (Inactive) (Psychology) Oletta Darter, MD (Psychiatry) Ladene Artist, MD as Consulting Physician (Oncology) Georgeanna Lea, MD as Consulting Physician (Cardiology)  Indicate any recent Medical Services you may have received from other than [facility] providers in the past year (date may be approximate).     Assessment:   This is a routine wellness examination for [place].  Hearing/Vision screen Hearing Screening - Comments:: Pt denies any hearing issues  Vision Screening - Comments:: Pt follows [facility] with [facility] for annual eye exams   Dietary issues and exercise activities discussed:      Goals Addressed             This Visit's Progress    Patient Stated       To lose a little weight        Depression Screen    12/14/2022    2:21 PM 11/16/2022   10:47 AM 08/01/2022    1:52 PM 06/09/2022   10:18 AM 04/07/2022   10:47 AM 12/01/2021    2:42 PM 10/14/2021    2:21 PM  PHQ 2/9 Scores  PHQ - 2 Score 0 0 0   0   PHQ- 9 Score 0 2          Information is confidential and restricted. Go to Review Flowsheets to unlock data.    Fall Risk    12/13/2022   11:03 AM 11/16/2022   10:46 AM 11/16/2022    8:53 AM 12/01/2021    2:43 PM 09/14/2021   10:33 AM  Fall Risk   Falls in the past year? 0 0 0 0 0  Number falls in past yr:  0  0   Injury with Fall?  0  0   Risk for fall due to : Impaired vision   Impaired vision;Impaired balance/gait   Follow up Falls prevention discussed  Falls prevention discussed     MEDICARE RISK AT HOME: Medicare Risk at Home Any stairs in or around the home?: Yes If so, are there any without handrails?: Yes Home free of loose throw rugs in walkways, pet beds, electrical cords, etc?: Yes Adequate lighting in your home to reduce risk of falls?: Yes Life alert?: No Use of a cane, walker or w/c?: No Grab bars in the bathroom?: No Shower chair or bench in shower?: No Elevated toilet seat or a handicapped toilet?: No  TIMED UP AND GO:  Was the test performed?  No    Cognitive Function:        12/14/2022    2:23 PM 12/01/2021    2:45 PM  6CIT Screen  What Year? 0 points 0 points  What month? 0 points 0 points  What time? 0 points 0 points  Count back from 20 0 points 0 points  Months in reverse 0 points 0 points  Repeat phrase 0 points 0 points  Total Score 0 points 0 points    Immunizations Immunization History  Administered Date(s) Administered   Fluad Quad(high Dose 65+) 01/29/2019, 01/19/2021   Influenza Whole 05/07/2010   Influenza, High Dose Seasonal PF 05/25/2016, 02/16/2017   Influenza,inj,Quad PF,6+ Mos 03/13/2014,  12/30/2014, 02/05/2018   Influenza-Unspecified 03/03/2020, 01/23/2022   Moderna Covid Bivalent Peds Booster(61mo Thru 78yrs) 01/23/2022   Moderna Sars-Covid-2 Vaccination 05/24/2019, 06/21/2019   PFIZER Comirnaty(Gray Top)Covid-19 Tri-Sucrose Vaccine 11/26/2020   PFIZER(Purple Top)SARS-COV-2 Vaccination 03/03/2020   PPD Test 09/20/2013   Pneumococcal Conjugate-13 09/10/2014   Pneumococcal Polysaccharide-23 08/13/2013   Tdap 08/08/2005      Flu Vaccine status: Due, Education has been provided regarding the importance of this vaccine. Advised may receive this vaccine at local pharmacy or Health Dept. Aware to provide a copy of the vaccination record if obtained from local pharmacy or Health Dept. Verbalized acceptance and understanding.  Pneumococcal vaccine status: Up to date  Covid-19 vaccine status: Completed vaccines  Qualifies for Shingles Vaccine? Yes   Zostavax completed No   Shingrix Completed?: No.    Education has been provided regarding the importance of this vaccine. Patient has been advised to call insurance company to determine out of pocket expense if they have not yet received this vaccine. Advised may also receive vaccine at local pharmacy or Health Dept. Verbalized acceptance and understanding.  Screening Tests Health Maintenance  Topic Date Due   INFLUENZA VACCINE  12/27/2022 (Originally 11/24/2022)   DEXA SCAN  11/17/2023   Lung Cancer Screening  12/03/2023   Medicare Annual Wellness (AWV)  12/14/2023   Pneumonia Vaccine 33+ Years old  Completed   Hepatitis C Screening  Completed   HPV VACCINES  Aged Out   DTaP/Tdap/Td  Discontinued   Colonoscopy  Discontinued   COVID-19 Vaccine  Discontinued   Zoster Vaccines- Shingrix  Discontinued    Health Maintenance  There are no preventive care reminders to display for this patient.   Colorectal cancer screening: No longer required.   Mammogram status: Completed 12/23/22. Repeat every year  Bone Density status:  Completed 11/16/21. Results reflect: Bone density results: OSTEOPOROSIS. Repeat every 2 years.  Lung Cancer Screening: (Low Dose CT Chest recommended if Age 58-80 years, 20 pack-year currently smoking OR have quit w/in 15years.) does qualify.   Lung Cancer Screening Referral: completed 12/09/22  Additional Screening:  Hepatitis C Screening:  Completed 06/06/17  Vision Screening: Recommended annual ophthalmology exams for early detection of glaucoma and other disorders of the eye.  Is the patient up to date with their annual eye exam?  Yes  Who is the provider or what is the name of the office in which the patient attends annual eye exams? [facility]  If pt is not established with a provider, would they like to be referred to a provider to establish care? No .   Dental Screening: Recommended annual dental exams for proper oral hygiene   Community Resource Referral / Chronic Care Management: CRR required this visit?  No   CCM required this visit?  No     Plan:     I have personally reviewed and noted the following in the patient's chart:   Medical and social history Use of alcohol, tobacco or illicit drugs  Current medications and supplements including opioid prescriptions. Patient is not currently taking opioid prescriptions. Functional ability and status Nutritional status Physical activity Advanced directives List of other physicians Hospitalizations, surgeries, and ER visits in previous 12 months Vitals Screenings to include cognitive, depression, and falls Referrals and appointments  In addition, I have reviewed and discussed with patient certain preventive protocols, quality metrics, and best practice recommendations. A written personalized care plan for preventive services as well as general preventive health recommendations were provided to patient.     Marzella Schlein, LPN   10/22/5282   After Visit Summary: (MyChart) Due to this being a telephonic visit, the after  visit summary with patients personalized plan was offered to patient via MyChart   Nurse Notes: none

## 2022-12-14 NOTE — Patient Instructions (Signed)
Kendra Ball , Thank you for taking time to come for your Medicare Wellness Visit. I appreciate your ongoing commitment to your health goals. Please review the following plan we discussed and let me know if I can assist you in the future.   Referrals/Orders/Follow-Ups/Clinician Recommendations: to lose a little weight   This is a list of the screening recommended for you and due dates:  Health Maintenance  Topic Date Due   Flu Shot  12/27/2022*   DEXA scan (bone density measurement)  11/17/2023   Screening for Lung Cancer  12/03/2023   Medicare Annual Wellness Visit  12/14/2023   Pneumonia Vaccine  Completed   Hepatitis C Screening  Completed   HPV Vaccine  Aged Out   DTaP/Tdap/Td vaccine  Discontinued   Colon Cancer Screening  Discontinued   COVID-19 Vaccine  Discontinued   Zoster (Shingles) Vaccine  Discontinued  *Topic was postponed. The date shown is not the original due date.    Advanced directives: (Declined) Advance directive discussed with you today. Even though you declined this today, please call our office should you change your mind, and we can give you the proper paperwork for you to fill out.  Next Medicare Annual Wellness Visit scheduled for next year: Yes

## 2022-12-22 ENCOUNTER — Other Ambulatory Visit: Payer: Self-pay | Admitting: Family Medicine

## 2022-12-22 DIAGNOSIS — N6459 Other signs and symptoms in breast: Secondary | ICD-10-CM

## 2022-12-23 ENCOUNTER — Ambulatory Visit: Payer: Medicare PPO

## 2023-01-03 ENCOUNTER — Ambulatory Visit
Admission: RE | Admit: 2023-01-03 | Discharge: 2023-01-03 | Disposition: A | Discharge status: Home / Self Care | Payer: Medicare PPO | Source: Ambulatory Visit | Attending: Family Medicine | Admitting: Family Medicine

## 2023-01-03 DIAGNOSIS — N6489 Other specified disorders of breast: Secondary | ICD-10-CM | POA: Diagnosis not present

## 2023-01-03 DIAGNOSIS — N6459 Other signs and symptoms in breast: Secondary | ICD-10-CM

## 2023-01-04 ENCOUNTER — Ambulatory Visit: Payer: Medicare PPO | Admitting: Family Medicine

## 2023-01-17 ENCOUNTER — Ambulatory Visit (HOSPITAL_BASED_OUTPATIENT_CLINIC_OR_DEPARTMENT_OTHER): Payer: Medicare PPO

## 2023-01-17 DIAGNOSIS — I6523 Occlusion and stenosis of bilateral carotid arteries: Secondary | ICD-10-CM | POA: Diagnosis not present

## 2023-01-19 NOTE — Progress Notes (Unsigned)
Cardiology Office Note:    Date:  01/19/2023   ID:  Kendra Ball, DOB 09/04/1945, MRN [id number]  PCP:  Natalia Leatherwood, DO  Cardiologist:  None  Electrophysiologist:  None   Referring MD: Natalia Leatherwood, DO   Chief Complaint: routine follow-up of CAD   History of Present Illness:    Kendra Ball is a 77 y.o. female with a history of mild non-obstructive CAD on coronary CTA in 07/2021,  carotid artery disease (right > left), hypertension, hyperlipidemia, hypothyroidism, COPD, iron deficiency anemia, anxiety/ depression, and substance abuse disorder including tobacco use who is followed by Dr. Bing Matter and presents today for routine follow-up.   Last Echo in 05/2020 showed LVEF of 50-55% with moderate hypokinesis in the basal to mid inferior wall and global hypokinesis of the remaining myocardial walls, normal RV size and function, and mild MR. Last ischemic evaluation was a coronary CTA in 07/2021 showed a coronary calcium score of 331 (82nd percentile for age and sex) and mild non-obstructive CAD. Last carotid doppler in 12/2022 showed 40-59% stenosis of left ICA and 60-79% stenosis of right ICA.   She was last seen by Dr. Bing Matter in 05/2022 at which time she reported a little more shortness of breath likely due to her COPD and was otherwise doing well from a cardiac standpoint.   Patient presents today for follow-up. ***  Non-Obstructive CAD Coronary CTA in 07/2021 showed a coronary calcium score of 331 (82nd percentile for age and sex) and mild non-obstructive CAD. - No chest pain.  - Continue Ranxexa 500mg  twice dialy.  - Continue aspirin and statin.   Carotid Stenosis Carotid dopplers in 12/2022 showed 40-59% stenosis of left ICA and 60-79% stenosis of right ICA. Unchanged from prior dopplers in 06/2022.  - Continue aspirin and statin.  - Plan is for repeat dopplers in 12/2023.   Hypertension BP well controlled. *** - Not currently on any antihypertensives.    Hyperlipidemia Lipid panel in 10/2022: Total Cholesterol 148, Triglycerides 81, HDL 78, LDL 53. LDL goal <70 given CAD. - Continue Crestor 10mg  daily.   COPD  Tobacco Abuse ***  EKGs/Labs/Other Studies Reviewed:    The following studies were reviewed:  Myoview 05/20/2020: Nuclear stress EF: 51%. There was no ST segment deviation noted during stress. No T wave inversion was noted during stress. There is a small, partially reversible perfusion defect of moderate severity present in the apical anterior location. Findings consistent with prior myocardial infarction with peri-infarct ischemia. This is a low risk study. The left ventricular ejection fraction is mildly decreased (45-54%). _______________  Echocardiogram 06/03/2020: Impressions: 1. Left ventricular ejection fraction, by estimation, is 50 to 55%. The  left ventricle has low normal function. The left ventricle demonstrates  moderate hypokinesis in the basal to mid inferior wall and the remaining  myocardial walls segments with  global hypokinesis. Left ventricular diastolic parameters are  indeterminate.   2. Right ventricular systolic function is normal. The right ventricular  size is normal.   3. The mitral valve is normal in structure. Mild mitral valve  regurgitation. No evidence of mitral stenosis.   4. The aortic valve is normal in structure. Aortic valve regurgitation is  not visualized. No aortic stenosis is present.   5. The inferior vena cava is normal in size with greater than 50%  respiratory variability, suggesting right atrial pressure of 3 mmHg.  _______________  Coronary CTA 07/27/2021: Impressions: 1. Coronary calcium score of 331. This was 19  percentile for age and sex matched control. 2. Normal coronary origin with right dominance. 3. CAD-RADS 2. Mild non-obstructive CAD (25-49%). Consider non-atherosclerotic causes of chest pain. Consider preventive therapy and risk factor  modification. _______________  Carotid Dopplers 01/17/2023: Summary:  - Right Carotid: Velocities in the right ICA are consistent with a 60-79% stenosis.  - Left Carotid: Velocities in the left ICA are consistent with a 40-59% stenosis.  - Vertebrals: Bilateral vertebral arteries demonstrate antegrade flow.  - Subclavians: Normal flow hemodynamics were seen in bilateral subclavian arteries.    EKG:  EKG ordered today. EKG personally reviewed and demonstrates ***.  Recent Labs: 05/12/2022: ALT 17 11/16/2022: BUN 11; Creatinine, Ser 0.69; Hemoglobin 14.1; Platelets 237.0; Potassium 3.9; Sodium 140; TSH 0.31  Recent Lipid Panel    Component Value Date/Time   CHOL 148 11/16/2022 1105   TRIG 81.0 11/16/2022 1105   HDL 78.90 11/16/2022 1105   CHOLHDL 2 11/16/2022 1105   VLDL 16.2 11/16/2022 1105   LDLCALC 53 11/16/2022 1105   LDLDIRECT 46 01/05/2021 1010   LDLDIRECT 133.5 08/09/2012 1036    Physical Exam:    Vital Signs: There were no vitals taken for this visit.    Wt Readings from Last 3 Encounters:  12/14/22 169 lb (76.7 kg)  11/25/22 171 lb 3.2 oz (77.7 kg)  11/16/22 168 lb 12.8 oz (76.6 kg)     General: 77 y.o. female in no acute distress. HEENT: Normocephalic and atraumatic. Sclera clear. EOMs intact. Neck: Supple. No carotid bruits. No JVD. Heart: *** RRR. Distinct S1 and S2. No murmurs, gallops, or rubs. Radial and distal pedal pulses 2+ and equal bilaterally. Lungs: No increased work of breathing. Clear to ausculation bilaterally. No wheezes, rhonchi, or rales.  Abdomen: Soft, non-distended, and non-tender to palpation. Bowel sounds present in all 4 quadrants.  MSK: Normal strength and tone for age. *** Extremities: No lower extremity edema.    Skin: Warm and dry. Neuro: Alert and oriented x3. No focal deficits. Psych: Normal affect. Responds appropriately.   Assessment:    No diagnosis found.  Plan:     Disposition: Follow up in ***   Medication  Adjustments/Labs and Tests Ordered: Current medicines are reviewed at length with the patient today.  Concerns regarding medicines are outlined above.  No orders of the defined types were placed in this encounter.  No orders of the defined types were placed in this encounter.   There are no Patient Instructions on file for this visit.   Leanne Lovely, PA-C  01/19/2023 9:42 PM    [facility]

## 2023-01-25 ENCOUNTER — Ambulatory Visit: Payer: Medicare PPO | Attending: Student | Admitting: Student

## 2023-01-25 ENCOUNTER — Encounter: Payer: Self-pay | Admitting: Student

## 2023-01-25 VITALS — BP 102/74 | HR 72 | Ht 64.0 in | Wt 167.2 lb

## 2023-01-25 DIAGNOSIS — I10 Essential (primary) hypertension: Secondary | ICD-10-CM | POA: Diagnosis not present

## 2023-01-25 DIAGNOSIS — I2510 Atherosclerotic heart disease of native coronary artery without angina pectoris: Secondary | ICD-10-CM | POA: Diagnosis not present

## 2023-01-25 DIAGNOSIS — Z720 Tobacco use: Secondary | ICD-10-CM

## 2023-01-25 DIAGNOSIS — I6523 Occlusion and stenosis of bilateral carotid arteries: Secondary | ICD-10-CM

## 2023-01-25 DIAGNOSIS — E785 Hyperlipidemia, unspecified: Secondary | ICD-10-CM | POA: Diagnosis not present

## 2023-01-25 DIAGNOSIS — I340 Nonrheumatic mitral (valve) insufficiency: Secondary | ICD-10-CM

## 2023-01-25 DIAGNOSIS — R5383 Other fatigue: Secondary | ICD-10-CM

## 2023-01-25 DIAGNOSIS — J449 Chronic obstructive pulmonary disease, unspecified: Secondary | ICD-10-CM

## 2023-01-25 MED ORDER — RANOLAZINE ER 500 MG PO TB12: 500.0000 mg | ORAL_TABLET | Freq: Two times a day (BID) | ORAL | 1 refills | Status: DC

## 2023-01-25 NOTE — Patient Instructions (Signed)
Medication Instructions:  Your physician recommends that you continue on your current medications as directed. Please refer to the Current Medication list given to you today.  *If you need a refill on your cardiac medications before your next appointment, please call your pharmacy*   Lab Work: Your provider recommends that you have labs drawn today: TSH, Free T3 &T4  If you have labs (blood work) drawn today and your tests are completely normal, you will receive your results only by: MyChart Message (if you have MyChart) OR A paper copy in the mail If you have any lab test that is abnormal or we need to change your treatment, we will call you to review the results.   Testing/Procedures: Your physician has requested that you have an echocardiogram. Echocardiography is a painless test that uses sound waves to create images of your heart. It provides your doctor with information about the size and shape of your heart and how well your heart's chambers and valves are working. This procedure takes approximately one hour. There are no restrictions for this procedure. Please do NOT wear cologne, perfume, aftershave, or lotions (deodorant is allowed). Please arrive 15 minutes prior to your appointment time. **To do in February 2025**    Follow-Up: At [facility], you and your health needs are our priority.  As part of our continuing mission to provide you with exceptional heart care, we have created designated Provider Care Teams.  These Care Teams include your primary Cardiologist (physician) and Advanced Practice Providers (APPs -  Physician Assistants and Nurse Practitioners) who all work together to provide you with the care you need, when you need it.  We recommend signing up for the patient portal called "MyChart".  Sign up information is provided on this After Visit Summary.  MyChart is used to connect with patients for Virtual Visits (Telemedicine).  Patients are able to view  lab/test results, encounter notes, upcoming appointments, etc.  Non-urgent messages can be sent to your provider as well.   To learn more about what you can do with MyChart, go to [email or web address].    Your next appointment:   12 month(s)  Provider:   Gypsy Balsam, MD or Marjie Skiff, PA-C

## 2023-01-26 LAB — TSH+T4F+T3FREE
Free T4: 1.43 ng/dL (ref 0.82–1.77)
T3, Free: 2.3 pg/mL (ref 2.0–4.4)
TSH: 1.46 u[IU]/mL (ref 0.450–4.500)

## 2023-01-31 ENCOUNTER — Ambulatory Visit: Payer: Medicare PPO | Admitting: Behavioral Health

## 2023-01-31 ENCOUNTER — Encounter: Payer: Self-pay | Admitting: Behavioral Health

## 2023-01-31 DIAGNOSIS — F411 Generalized anxiety disorder: Secondary | ICD-10-CM | POA: Diagnosis not present

## 2023-01-31 DIAGNOSIS — F17200 Nicotine dependence, unspecified, uncomplicated: Secondary | ICD-10-CM

## 2023-01-31 DIAGNOSIS — F99 Mental disorder, not otherwise specified: Secondary | ICD-10-CM | POA: Diagnosis not present

## 2023-01-31 DIAGNOSIS — F5105 Insomnia due to other mental disorder: Secondary | ICD-10-CM | POA: Diagnosis not present

## 2023-01-31 DIAGNOSIS — F331 Major depressive disorder, recurrent, moderate: Secondary | ICD-10-CM

## 2023-01-31 DIAGNOSIS — F1020 Alcohol dependence, uncomplicated: Secondary | ICD-10-CM | POA: Diagnosis not present

## 2023-01-31 MED ORDER — BUPROPION HCL ER (SR) 150 MG PO TB12
150.0000 mg | ORAL_TABLET | Freq: Two times a day (BID) | ORAL | 1 refills | Status: DC
Start: 2023-01-31 — End: 2023-07-12

## 2023-01-31 MED ORDER — BUSPIRONE HCL 15 MG PO TABS: 15.0000 mg | ORAL_TABLET | Freq: Three times a day (TID) | ORAL | 1 refills | Status: DC

## 2023-01-31 MED ORDER — VENLAFAXINE HCL ER 150 MG PO CP24: 150.0000 mg | ORAL_CAPSULE | Freq: Every day | ORAL | 1 refills | Status: DC

## 2023-01-31 MED ORDER — TRAZODONE HCL 100 MG PO TABS: 200.0000 mg | ORAL_TABLET | Freq: Every day | ORAL | 1 refills | Status: DC

## 2023-01-31 NOTE — Progress Notes (Signed)
[facility]  Patient ID: Kendra Ball,  MRN: [id number]  PCP: Kendra Leatherwood, DO  Date of Evaluation: 01/31/2023 Time spent:30 minutes  Chief Complaint:  Chief Complaint   Depression; Anxiety; Follow-up; Medication Refill; Patient Education     HISTORY/CURRENT STATUS: HPI  "Kendra Ball", 77 year old female presents to this office for initial visit and to establish care.  Collateral information should be considered reliable.  Patient states that she was previously seeing Dr. Oletta Ball who is now retiring.  Says that she needs a new provider to assist in managing her psychiatric medication.  Dr. Michae Ball offered to provide patient with bridge on medications.  Patient says that she has a long history of anxiety and depression stemming back 20 years.  She said that she struggled with alcohol abuse approximately 5 years ago and went through rehab at [facility] [place].  Said the program was life-changing and she has not had any EtOH since 2017.  She does still endorse heavy cigarette smoking approximately 1 pack/day.  Says that she is doing well and very stable at this time.  She does not want to make any changes to her current medication regimen.  Says that her anxiety is 3/10, and depression is 2/10.  She is sleeping 7 to 8 hours per night with the aid of trazodone.  She endorses previous racing thoughts and risky or fullest behaviors in the past.  She denies any history of mania, no psychosis, no auditory or visual hallucinations or delirium.  Denies SI or HI.  She is not requesting refills at this time.   Prior psychiatric medication trials: Xanax-patient previously acknowledged abusing the medication so it was stopped 5 years ago.  Recommended that she not utilize any controlled substance. Sertraline Effexor Trazodone Wellbutrin     Individual Medical History/ Review of Systems: Changes? :No   Allergies: Morphine sulfate  Current Medications:  Current Outpatient  Medications:    albuterol (VENTOLIN HFA) 108 (90 Base) MCG/ACT inhaler, Inhale 2 puffs into the lungs every 6 (six) hours as needed for wheezing or shortness of breath., Disp: 8 g, Rfl: 11   aspirin EC 81 MG tablet, Take 1 tablet (81 mg total) by mouth daily. Swallow whole., Disp: 90 tablet, Rfl: 3   buPROPion (WELLBUTRIN SR) 150 MG 12 hr tablet, Take 1 tablet (150 mg total) by mouth 2 (two) times daily., Disp: 180 tablet, Rfl: 1   busPIRone (BUSPAR) 15 MG tablet, Take 1 tablet (15 mg total) by mouth 3 (three) times daily., Disp: 270 tablet, Rfl: 1   Cyanocobalamin 2500 MCG SUBL, 1 tab sublingual daily, Disp: 90 tablet, Rfl: 3   denosumab (PROLIA) 60 MG/ML SOSY injection, Inject 60 mg into the skin every 6 (six) months., Disp: , Rfl:    ferrous fumarate (HEMOCYTE - 106 MG FE) 325 (106 FE) MG TABS, Take 1 tablet by mouth daily. Reported on 10/06/2015, Disp: , Rfl:    levothyroxine (SYNTHROID) 100 MCG tablet, 1 tab PO 6 days a week and 1/2 tab on Sunday., Disp: 90 tablet, Rfl: 3   Magnesium 250 MG TABS, Take 250 mg by mouth daily. , Disp: , Rfl:    ranolazine (RANEXA) 500 MG 12 hr tablet, Take 1 tablet (500 mg total) by mouth 2 (two) times daily., Disp: 180 tablet, Rfl: 1   rosuvastatin (CRESTOR) 10 MG tablet, Take 1 tablet (10 mg total) by mouth daily., Disp: 90 tablet, Rfl: 1   SYMBICORT 160-4.5 MCG/ACT inhaler, INHALE 2 PUFFS INTO  THE LUNGS TWICE A DAY, Disp: 10.2 each, Rfl: 3   traZODone (DESYREL) 100 MG tablet, Take 2 tablets (200 mg total) by mouth at bedtime., Disp: 180 tablet, Rfl: 1   venlafaxine XR (EFFEXOR-XR) 150 MG 24 hr capsule, Take 1 capsule (150 mg total) by mouth daily with breakfast., Disp: 90 capsule, Rfl: 1 Medication Side Effects: none  Family Medical/ Social History: Changes? No  MENTAL HEALTH EXAM:  There were no vitals taken for this visit.There is no height or weight on file to calculate BMI.  General Appearance: Casual, Neat, and Well Groomed  Eye Contact:  Good   Speech:  Clear and Coherent  Volume:  Normal  Mood:  Anxious, Depressed, and Dysphoric  Affect:  Appropriate  Thought Process:  Coherent  Orientation:  Full (Time, Place, and Person)  Thought Content: Logical   Suicidal Thoughts:  No  Homicidal Thoughts:  No  Memory:  WNL  Judgement:  Good  Insight:  Good  Psychomotor Activity:  Normal  Concentration:  Concentration: Good  Recall:  Good  Fund of Knowledge: Good  Language: Good  Assets:  Desire for Improvement  ADL's:  Intact  Cognition: WNL  Prognosis:  Good    DIAGNOSES:    ICD-10-CM   1. Nicotine use disorder  F17.200 buPROPion (WELLBUTRIN SR) 150 MG 12 hr tablet    2. MDD (major depressive disorder), recurrent episode, moderate (HCC)  F33.1 buPROPion (WELLBUTRIN SR) 150 MG 12 hr tablet    busPIRone (BUSPAR) 15 MG tablet    traZODone (DESYREL) 100 MG tablet    venlafaxine XR (EFFEXOR-XR) 150 MG 24 hr capsule    3. GAD (generalized anxiety disorder)  F41.1 busPIRone (BUSPAR) 15 MG tablet    venlafaxine XR (EFFEXOR-XR) 150 MG 24 hr capsule    4. Alcohol use disorder, moderate, dependence (HCC)  F10.20 busPIRone (BUSPAR) 15 MG tablet    traZODone (DESYREL) 100 MG tablet    5. Insomnia due to other mental disorder  F51.05 traZODone (DESYREL) 100 MG tablet   F99       Receiving Psychotherapy: No    RECOMMENDATIONS:   Greater than 50% of 30 min face to face time with patient was spent on counseling and coordination of care. We discussed her continued stability  with anxiety and depression. We discussed her concerns of increased seasonal depression and she requested  f/u in Dec. For now she is happy with her medications.    We agreed today to:   Will continue Effexor 150 mg XR daily Will continue buspirone 15 mg 3 times daily Will continue Wellbutrin 150 mg SR daily Will continue trazodone 200 mg at bedtime daily To follow-up in 2 months to reassess Provided emergency contact information Will report worsening  symptoms or side effects promptly Reviewed PDMP  Kendra Flores, NP

## 2023-02-21 ENCOUNTER — Other Ambulatory Visit: Payer: Self-pay | Admitting: Family Medicine

## 2023-02-21 DIAGNOSIS — J4489 Other specified chronic obstructive pulmonary disease: Secondary | ICD-10-CM

## 2023-04-05 ENCOUNTER — Encounter: Payer: Self-pay | Admitting: Behavioral Health

## 2023-04-05 ENCOUNTER — Ambulatory Visit: Payer: Medicare PPO | Admitting: Behavioral Health

## 2023-04-05 DIAGNOSIS — F330 Major depressive disorder, recurrent, mild: Secondary | ICD-10-CM

## 2023-04-05 DIAGNOSIS — F411 Generalized anxiety disorder: Secondary | ICD-10-CM

## 2023-04-05 NOTE — Progress Notes (Signed)
[facility]  Patient ID: Kendra Ball,  MRN: [id number]  PCP: Natalia Leatherwood, DO  Date of Evaluation: 04/05/2023 Time spent:30 minutes  Chief Complaint:  Chief Complaint   Anxiety; Depression; Follow-up; Patient Education; Medication Refill     HISTORY/CURRENT STATUS: HPI  "Kendra Ball", 77 year old female presents to this office for initial visit and to establish care.  Collateral information should be considered reliable.  Reports doing very well right now. Ready for the holidays and family. She does still endorse heavy cigarette smoking approximately 1 pack/day.  Requesting no changes to her medication this visit. Says that her anxiety is 2/10, and depression is 2/10.  She is sleeping 7 to 8 hours per night with the aid of trazodone.  She endorses previous racing thoughts and risky or fullest behaviors in the past.  She denies any history of mania, no psychosis, no auditory or visual hallucinations or delirium.  Denies SI or HI.  She is not requesting refills at this time.   Prior psychiatric medication trials: Xanax-patient previously acknowledged abusing the medication so it was stopped 5 years ago.  Recommended that she not utilize any controlled substance. Sertraline Effexor Trazodone Wellbutrin Individual Medical History/ Review of Systems: Changes? :No   Allergies: Morphine sulfate  Current Medications:  Current Outpatient Medications:    albuterol (VENTOLIN HFA) 108 (90 Base) MCG/ACT inhaler, TAKE 2 PUFFS BY MOUTH EVERY 6 HOURS AS NEEDED FOR WHEEZE OR SHORTNESS OF BREATH, Disp: 8.5 each, Rfl: 5   aspirin EC 81 MG tablet, Take 1 tablet (81 mg total) by mouth daily. Swallow whole., Disp: 90 tablet, Rfl: 3   buPROPion (WELLBUTRIN SR) 150 MG 12 hr tablet, Take 1 tablet (150 mg total) by mouth 2 (two) times daily., Disp: 180 tablet, Rfl: 1   busPIRone (BUSPAR) 15 MG tablet, Take 1 tablet (15 mg total) by mouth 3 (three) times daily., Disp: 270 tablet, Rfl: 1    Cyanocobalamin 2500 MCG SUBL, 1 tab sublingual daily, Disp: 90 tablet, Rfl: 3   denosumab (PROLIA) 60 MG/ML SOSY injection, Inject 60 mg into the skin every 6 (six) months., Disp: , Rfl:    ferrous fumarate (HEMOCYTE - 106 MG FE) 325 (106 FE) MG TABS, Take 1 tablet by mouth daily. Reported on 10/06/2015, Disp: , Rfl:    levothyroxine (SYNTHROID) 100 MCG tablet, 1 tab PO 6 days a week and 1/2 tab on Sunday., Disp: 90 tablet, Rfl: 3   Magnesium 250 MG TABS, Take 250 mg by mouth daily. , Disp: , Rfl:    ranolazine (RANEXA) 500 MG 12 hr tablet, Take 1 tablet (500 mg total) by mouth 2 (two) times daily., Disp: 180 tablet, Rfl: 1   rosuvastatin (CRESTOR) 10 MG tablet, Take 1 tablet (10 mg total) by mouth daily., Disp: 90 tablet, Rfl: 1   SYMBICORT 160-4.5 MCG/ACT inhaler, INHALE 2 PUFFS INTO THE LUNGS TWICE A DAY, Disp: 10.2 each, Rfl: 3   traZODone (DESYREL) 100 MG tablet, Take 2 tablets (200 mg total) by mouth at bedtime., Disp: 180 tablet, Rfl: 1   venlafaxine XR (EFFEXOR-XR) 150 MG 24 hr capsule, Take 1 capsule (150 mg total) by mouth daily with breakfast., Disp: 90 capsule, Rfl: 1 Medication Side Effects: none  Family Medical/ Social History: Changes? No  MENTAL HEALTH EXAM:  There were no vitals taken for this visit.There is no height or weight on file to calculate BMI.  General Appearance: Casual, Neat, and Well Groomed  Eye Contact:  Good  Speech:  Clear and Coherent  Volume:  Normal  Mood:  NA  Affect:  Appropriate  Thought Process:  Coherent  Orientation:  Full (Time, Place, and Person)  Thought Content: Logical   Suicidal Thoughts:  No  Homicidal Thoughts:  No  Memory:  WNL  Judgement:  Good  Insight:  Good  Psychomotor Activity:  Normal  Concentration:  Attention Span: Good  Recall:  Good  Fund of Knowledge: Good  Language: Good  Assets:  Desire for Improvement  ADL's:  Intact  Cognition: WNL  Prognosis:  Good    DIAGNOSES:    ICD-10-CM   1. Mild episode of recurrent  major depressive disorder (HCC)  F33.0     2. Generalized anxiety disorder  F41.1       Receiving Psychotherapy: No    RECOMMENDATIONS:   Greater than 50% of 30 min face to face time with patient was spent on counseling and coordination of care. We discussed her continued stability  with anxiety and depression. We discussed her concerns of increased seasonal depression but for now doing very well. Currently happy with her meds.    We agreed today to:   Will continue Effexor 150 mg XR daily Will continue buspirone 15 mg 3 times daily Will continue Wellbutrin 150 mg SR daily Will continue trazodone 200 mg at bedtime daily To follow-up in 2 months to reassess Provided emergency contact information Will report worsening symptoms or side effects promptly Reviewed PDMP     Joan Flores, NP

## 2023-04-30 ENCOUNTER — Encounter: Payer: Self-pay | Admitting: Family Medicine

## 2023-05-02 ENCOUNTER — Other Ambulatory Visit: Payer: Self-pay | Admitting: Student

## 2023-05-02 DIAGNOSIS — I2510 Atherosclerotic heart disease of native coronary artery without angina pectoris: Secondary | ICD-10-CM

## 2023-05-02 DIAGNOSIS — J449 Chronic obstructive pulmonary disease, unspecified: Secondary | ICD-10-CM

## 2023-05-02 DIAGNOSIS — I10 Essential (primary) hypertension: Secondary | ICD-10-CM

## 2023-05-02 DIAGNOSIS — Z720 Tobacco use: Secondary | ICD-10-CM

## 2023-05-02 DIAGNOSIS — E785 Hyperlipidemia, unspecified: Secondary | ICD-10-CM

## 2023-05-02 DIAGNOSIS — I6523 Occlusion and stenosis of bilateral carotid arteries: Secondary | ICD-10-CM

## 2023-05-02 DIAGNOSIS — R5383 Other fatigue: Secondary | ICD-10-CM

## 2023-05-02 DIAGNOSIS — I340 Nonrheumatic mitral (valve) insufficiency: Secondary | ICD-10-CM

## 2023-05-04 ENCOUNTER — Telehealth: Payer: Self-pay

## 2023-05-04 ENCOUNTER — Encounter: Payer: Self-pay | Admitting: Family Medicine

## 2023-05-04 ENCOUNTER — Ambulatory Visit (INDEPENDENT_AMBULATORY_CARE_PROVIDER_SITE_OTHER): Payer: Medicare PPO | Admitting: Family Medicine

## 2023-05-04 VITALS — BP 120/68 | HR 83 | Temp 97.6°F | Wt 162.2 lb

## 2023-05-04 DIAGNOSIS — Z79899 Other long term (current) drug therapy: Secondary | ICD-10-CM

## 2023-05-04 DIAGNOSIS — B351 Tinea unguium: Secondary | ICD-10-CM

## 2023-05-04 DIAGNOSIS — L089 Local infection of the skin and subcutaneous tissue, unspecified: Secondary | ICD-10-CM

## 2023-05-04 LAB — COMPREHENSIVE METABOLIC PANEL
ALT: 21 U/L (ref 0–35)
AST: 28 U/L (ref 0–37)
Albumin: 3.9 g/dL (ref 3.5–5.2)
Alkaline Phosphatase: 49 U/L (ref 39–117)
BUN: 11 mg/dL (ref 6–23)
CO2: 27 meq/L (ref 19–32)
Calcium: 9.2 mg/dL (ref 8.4–10.5)
Chloride: 104 meq/L (ref 96–112)
Creatinine, Ser: 0.77 mg/dL (ref 0.40–1.20)
GFR: 74.66 mL/min (ref >60.00)
Glucose, Bld: 93 mg/dL (ref 70–99)
Potassium: 4.3 meq/L (ref 3.5–5.1)
Sodium: 140 meq/L (ref 135–145)
Total Bilirubin: 0.6 mg/dL (ref 0.2–1.2)
Total Protein: 6.2 g/dL (ref 6.0–8.3)

## 2023-05-04 NOTE — Progress Notes (Signed)
 Kendra Ball , October 08, 1945, 78 y.o., female MRN: [id number] Patient Care Team    Relationship Specialty Notifications Start End  Catherine Charlies DELENA, DO PCP - General Family Medicine  12/30/14   Kristie Lamprey, MD Consulting Physician Gastroenterology  05/09/14   Janit Caldron, LCAS (Inactive)  Psychology  10/16/15   Brutus Dales, MD (Inactive)  Psychiatry  12/12/16   Cloretta Arley NOVAK, MD Consulting Physician Oncology  04/23/20   Bernie Lamar PARAS, MD Consulting Physician Cardiology  05/12/22     Chief Complaint  Patient presents with   Rash    Hands and under left breast     Subjective: Kendra Ball is a 78 y.o. Pt presents for an OV with complaints of rash on hips of 3 fingers and on right palm.  She has had a fungal rash in the past and used antifungal cream which she states took the rashes away.  She has used that cream on her current rash it sometimes can makes it better, but is not completely resolving the issue.  She has multiple fingernails with fungal infection present. 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     12/14/2022    2:21 PM 11/16/2022   10:47 AM 08/01/2022    1:52 PM 06/09/2022   10:18 AM 04/07/2022   10:47 AM  Depression screen PHQ 2/9  Decreased Interest 0 0 0    Down, Depressed, Hopeless 0 0 0    PHQ - 2 Score 0 0 0    Altered sleeping 0 0     Tired, decreased energy 0 2     Change in appetite 0 0     Feeling bad or failure about yourself  0 0     Trouble concentrating 0 0     Moving slowly or fidgety/restless 0 0     Suicidal thoughts 0 0     PHQ-9 Score 0 2        Information is confidential and restricted. Go to Review Flowsheets to unlock data.    Allergies  Allergen Reactions   Morphine Sulfate Swelling and Other (See Comments)    Swelling around injection area   Social History   Social History Narrative   Kendra Ball lives in [place] with her husband. She has 3 grown children and several grand children.  She has joint custody of 3 of her grand children as her daughter has bipolar disorder and has needed assistance with children in past.   1-2 cups coffee, 3-6 cups tea per day.   Right-handed.   Past Medical History:  Diagnosis Date   Abnormal mammogram 10/01/2013   Diagnostic MMG & US  confirm cyst & duct ectasia of L breast. 08/2013.    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 RF: smoker, fam Hx, sedentary HDL is high!!    Dyspnea on exertion 05/08/2020   Essential hypertension  05/22/2014   Family history of malignant neoplasm of gastrointestinal tract 05/05/2020   Family history of malignant neoplasm of gastrointestinal tract 05/05/2020   Family history of premature coronary heart disease 08/13/2013   Father, brother, sister Personal risk factors: smoker, female over 68, fam Hx, sedentary, thyroid  disease, dyslipidemia, BMI over 25.    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     Insomnia 05/05/2015   Iron deficiency anemia 05/05/2020   Lymphoma (HCC) 2019   B-cell   MDD (major depressive  disorder), recurrent episode, moderate (HCC) 01/30/2014   [facility] 05/2015 08/18/2015:  Effexor  XR 225mg  po qD for depression and anxiety Wellbutrin  SR to 200mg  po BID for depression   Xanax  0.5mg  po BID and 1mg  po qHS prn anxiety       Routine PRN Medications:  Yes    Consultations: encouraged to f/up with PCP as needed, encouraged to start individual therapy   Safety Concerns:  Pt denies SI and is at an acute low risk for suicide.Patient told to call clinic i   Multiple fractures 01/29/2019   Obesity    Osteoporosis    Paresthesia 08/06/2019   Port-A-Cath in place 12/27/2017   Pulmonary nodule seen on imaging study 02/13/2015   Smoker 09/14/2015   Substance abuse [facility])    inpatient and outpatient tx for substance abuse   TOBACCO USER 12/25/2009   Tremor 05/23/2019   Past Surgical History:  Procedure Laterality Date   AXILLARY LYMPH NODE BIOPSY Right 06/22/2017   Procedure: RIGHT AXILLARY LYMPH NODE BIOPSY;  Surgeon: Belinda Cough, MD;  Location: [facility];  Service: General;  Laterality: Right;   BARIATRIC SURGERY  2006   CESAREAN SECTION     COLON RESECTION N/A 05/09/2014   Procedure: LAPAROSCOPIC HAND-ASSISTED EXTENDED RIGHT COLECTOMY, LAPAROSCOPIC LYSIS OF ADHESIONS, SPLENIC FLEXURE MOBILIZATION.;  Surgeon: Elspeth Schultze, MD;  Location: [facility];  Service: General;  Laterality: N/A;   DILATION AND CURETTAGE OF UTERUS     x2   FOREARM FRACTURE SURGERY     right   GASTRIC BYPASS  2006   IR IMAGING GUIDED PORT INSERTION  12/01/2017   IR REMOVAL TUN ACCESS W/ PORT W/O FL MOD SED  07/03/2018   MASS EXCISION Right 02/24/2017   Procedure: EXCISIONAL BIOPSY RIGHT SUPRA-CLAVICULAR NODE;  Surgeon: Arlana Arnt, MD;  Location: [facility];  Service: ENT;  Laterality: Right;   TONSILLECTOMY     Family History  Problem Relation Age of Onset   Cancer Mother        colon   Depression Mother    Alzheimer's disease Mother        Dementia   Heart disease Father 70   Heart disease Sister 40        stents, pacemaker   Depression Sister    Heart disease Brother 52       CABG, MI   Depression Brother    Obesity Daughter    Suicidality Daughter    Bipolar disorder Daughter    Obesity Son    Mental illness Daughter        bipolar  Obesity Daughter    Depression Sister    Dementia Paternal Aunt    Alcohol  abuse Maternal Uncle    Colon cancer Maternal Aunt    Stomach cancer Maternal Grandfather    Allergies as of 05/04/2023       Reactions   Morphine Sulfate Swelling, Other (See Comments)   Swelling around injection area        Medication List        Accurate as of May 04, 2023 11:59 PM. If you have any questions, ask your nurse or doctor.          albuterol  108 (90 Base) MCG/ACT inhaler Commonly known as: VENTOLIN  HFA TAKE 2 PUFFS BY MOUTH EVERY 6 HOURS AS NEEDED FOR WHEEZE OR SHORTNESS OF BREATH   aspirin  EC 81 MG tablet Take 1 tablet (81 mg total) by mouth daily. Swallow whole.   buPROPion  150 MG 12 hr tablet Commonly known as: WELLBUTRIN  SR Take 1 tablet (150 mg total) by mouth 2 (two) times daily.   busPIRone  15 MG tablet Commonly known as: BUSPAR  Take 1 tablet (15 mg total) by mouth 3 (three) times daily.   Cyanocobalamin  2500 MCG Subl 1 tab sublingual daily   denosumab  60 MG/ML Sosy injection Commonly known as: PROLIA  Inject 60 mg into the skin every 6 (six) months.   ferrous fumarate  325 (106 Fe) MG Tabs tablet Commonly known as: HEMOCYTE - 106 mg FE Take 1 tablet by mouth daily. Reported on 10/06/2015   levothyroxine  100 MCG tablet Commonly known as: SYNTHROID  1 tab PO 6 days a week and 1/2 tab on Sunday.   Magnesium 250 MG Tabs Take 250 mg by mouth daily.   ranolazine  500 MG 12 hr tablet Commonly known as: RANEXA  Take 1 tablet (500 mg total) by mouth 2 (two) times daily.   rosuvastatin  10 MG tablet Commonly known as: CRESTOR  Take 1 tablet (10 mg total) by mouth daily.   Symbicort  160-4.5 MCG/ACT inhaler Generic drug:  budesonide -formoterol  INHALE 2 PUFFS INTO THE LUNGS TWICE A DAY   traZODone  100 MG tablet Commonly known as: DESYREL  Take 2 tablets (200 mg total) by mouth at bedtime.   venlafaxine  XR 150 MG 24 hr capsule Commonly known as: EFFEXOR -XR Take 1 capsule (150 mg total) by mouth daily with breakfast.        All past medical history, surgical history, allergies, family history, immunizations andmedications were updated in the EMR today and reviewed under the history and medication portions of their EMR.     ROS Negative, with the exception of above mentioned in HPI   Objective:  BP 120/68   Pulse 83   Temp 97.6 F (36.4 C)   Wt 162 lb 3.2 oz (73.6 kg)   SpO2 94%   BMI 27.84 kg/m  Body mass index is 27.84 kg/m. Physical Exam Vitals and nursing note reviewed.  Constitutional:      General: She is not in acute distress.    Appearance: Normal appearance. She is normal weight. She is not ill-appearing or toxic-appearing.  HENT:     Head: Normocephalic and atraumatic.  Eyes:     General: No scleral icterus.       Right eye: No discharge.        Left eye: No discharge.     Extraocular Movements: Extraocular movements intact.     Conjunctiva/sclera: Conjunctivae normal.     Pupils: Pupils are equal, round, and reactive to light.  Skin:    Findings: Lesion (  Mildly red, dry scaly areas x 3 PIP joints and quarter size lesion on palm) present. No rash.  Neurological:     Mental Status: She is alert and oriented to person, place, and time. Mental status is at baseline.     Motor: No weakness.     Gait: Gait normal.  Psychiatric:        Mood and Affect: Mood normal.        Behavior: Behavior normal.        Thought Content: Thought content normal.        Judgment: Judgment normal.      No results found. No results found. No results found for this or any previous visit (from the past 24 hours).  Assessment/Plan: Kendra Ball is a 78 y.o. female present for OV for   Skin infection (Primary)/High risk medication use/onychomycosis - Comp Met (CMET) -Fungal infection affecting multiple nails and skin of fingers and palm.  Can continue topical antifungal.  Will collect CMP today to ensure liver functions are normal. - Assuming liver functions are normal terbinafine  250 mg daily tab x 12 weeks prescribed. Patient aware she will need to have repeat LFTs in 4 weeks. Follow-up as needed.  If she is not seeing improvement, she is encouraged  to let us  know, so we can put a dermatology referral in for her for a biopsy with her history of B-cell lymphoma.  Reviewed expectations re: course of current medical issues. Discussed self-management of symptoms. Outlined signs and symptoms indicating need for more acute intervention. Patient verbalized understanding and all questions were answered. Patient received an After-Visit Summary.    Orders Placed This Encounter  Procedures   Comp Met (CMET)   No orders of the defined types were placed in this encounter.  Referral Orders  No referral(s) requested today     Note is dictated utilizing voice recognition software. Although note has been proof read prior to signing, occasional typographical errors still can be missed. If any questions arise, please do not hesitate to call for verification.   electronically signed by:  Charlies Bellini, DO  [facility] - [place]

## 2023-05-04 NOTE — Telephone Encounter (Signed)
 Please complete PA for prolia injection

## 2023-05-05 ENCOUNTER — Telehealth: Payer: Self-pay | Admitting: Family Medicine

## 2023-05-05 DIAGNOSIS — Z79899 Other long term (current) drug therapy: Secondary | ICD-10-CM

## 2023-05-05 MED ORDER — TERBINAFINE HCL 250 MG PO TABS: 250.0000 mg | ORAL_TABLET | Freq: Every day | ORAL | 2 refills | Status: DC

## 2023-05-05 NOTE — Telephone Encounter (Signed)
 Please call patient Her liver and kidney function are normal. I have called in terbinafine  tablets, 1 tab daily, to help treat her skin infection.  She will need to take this for 12 weeks.  We will need to monitor her liver function tests 4 weeks after starting this medication.  Please schedule her for lab appointment only.

## 2023-05-05 NOTE — Telephone Encounter (Signed)
 Spoke with patient regarding results/recommendations.

## 2023-05-17 ENCOUNTER — Other Ambulatory Visit: Payer: Self-pay

## 2023-05-17 MED ORDER — BUDESONIDE-FORMOTEROL FUMARATE 160-4.5 MCG/ACT IN AERO: 2.0000 | INHALATION_SPRAY | Freq: Two times a day (BID) | RESPIRATORY_TRACT | 0 refills | Status: DC

## 2023-05-20 ENCOUNTER — Other Ambulatory Visit: Payer: Self-pay | Admitting: Cardiology

## 2023-05-29 ENCOUNTER — Ambulatory Visit (HOSPITAL_BASED_OUTPATIENT_CLINIC_OR_DEPARTMENT_OTHER)
Admission: RE | Admit: 2023-05-29 | Discharge: 2023-05-29 | Disposition: A | Discharge status: Home / Self Care | Payer: Medicare PPO | Source: Ambulatory Visit | Attending: Student | Admitting: Student

## 2023-05-29 DIAGNOSIS — I340 Nonrheumatic mitral (valve) insufficiency: Secondary | ICD-10-CM | POA: Insufficient documentation

## 2023-05-29 DIAGNOSIS — R5383 Other fatigue: Secondary | ICD-10-CM | POA: Insufficient documentation

## 2023-05-29 DIAGNOSIS — I2510 Atherosclerotic heart disease of native coronary artery without angina pectoris: Secondary | ICD-10-CM | POA: Insufficient documentation

## 2023-05-29 LAB — ECHOCARDIOGRAM COMPLETE
AR max vel: 1.45 cm2
AV Area VTI: 1.52 cm2
AV Area mean vel: 1.44 cm2
AV Mean grad: 6 mm[Hg]
AV Peak grad: 12.8 mm[Hg]
Ao pk vel: 1.79 m/s
Area-P 1/2: 3.83 cm2
Calc EF: 60.9 %
MV M vel: 5.49 m/s
MV Peak grad: 120.6 mm[Hg]
MV Vena cont: 0.3 cm
Radius: 0.47 cm
S' Lateral: 4.1 cm
Single Plane A2C EF: 58.6 %
Single Plane A4C EF: 62.5 %

## 2023-06-02 ENCOUNTER — Encounter: Payer: Self-pay | Admitting: Family Medicine

## 2023-06-05 NOTE — Telephone Encounter (Signed)
Please advise on PA

## 2023-06-09 ENCOUNTER — Other Ambulatory Visit: Payer: Medicare PPO

## 2023-06-09 DIAGNOSIS — Z79899 Other long term (current) drug therapy: Secondary | ICD-10-CM | POA: Diagnosis not present

## 2023-06-09 LAB — HEPATIC FUNCTION PANEL
ALT: 17 U/L (ref 0–35)
AST: 24 U/L (ref 0–37)
Albumin: 3.9 g/dL (ref 3.5–5.2)
Alkaline Phosphatase: 48 U/L (ref 39–117)
Bilirubin, Direct: 0.1 mg/dL (ref 0.0–0.3)
Total Bilirubin: 0.5 mg/dL (ref 0.2–1.2)
Total Protein: 6 g/dL (ref 6.0–8.3)

## 2023-06-12 ENCOUNTER — Encounter: Payer: Self-pay | Admitting: Family Medicine

## 2023-06-28 ENCOUNTER — Ambulatory Visit: Payer: Medicare PPO

## 2023-06-28 ENCOUNTER — Ambulatory Visit: Payer: Medicare PPO | Admitting: Family Medicine

## 2023-06-28 DIAGNOSIS — M810 Age-related osteoporosis without current pathological fracture: Secondary | ICD-10-CM | POA: Diagnosis not present

## 2023-06-28 MED ORDER — DENOSUMAB 60 MG/ML ~~LOC~~ SOSY
60.0000 mg | PREFILLED_SYRINGE | Freq: Once | SUBCUTANEOUS | Status: AC
  Administered 2023-06-28: 60 mg via SUBCUTANEOUS

## 2023-06-28 NOTE — Addendum Note (Signed)
 Addended by: Filomena Jungling on: 06/28/2023 10:38 AM   Modules accepted: Orders

## 2023-06-28 NOTE — Progress Notes (Signed)
 Pt here for semi annual prolia injection per [facility] Prolia given IM, and pt tolerated injection well.

## 2023-07-05 ENCOUNTER — Encounter: Payer: Self-pay | Admitting: Behavioral Health

## 2023-07-05 ENCOUNTER — Ambulatory Visit: Payer: Medicare PPO | Admitting: Behavioral Health

## 2023-07-05 DIAGNOSIS — F5105 Insomnia due to other mental disorder: Secondary | ICD-10-CM | POA: Diagnosis not present

## 2023-07-05 DIAGNOSIS — F331 Major depressive disorder, recurrent, moderate: Secondary | ICD-10-CM

## 2023-07-05 DIAGNOSIS — F99 Mental disorder, not otherwise specified: Secondary | ICD-10-CM | POA: Diagnosis not present

## 2023-07-05 DIAGNOSIS — F411 Generalized anxiety disorder: Secondary | ICD-10-CM

## 2023-07-05 DIAGNOSIS — F1020 Alcohol dependence, uncomplicated: Secondary | ICD-10-CM | POA: Diagnosis not present

## 2023-07-05 DIAGNOSIS — F17200 Nicotine dependence, unspecified, uncomplicated: Secondary | ICD-10-CM

## 2023-07-05 DIAGNOSIS — F330 Major depressive disorder, recurrent, mild: Secondary | ICD-10-CM | POA: Diagnosis not present

## 2023-07-05 MED ORDER — TRAZODONE HCL 100 MG PO TABS: 200.0000 mg | ORAL_TABLET | Freq: Every day | ORAL | 1 refills | Status: DC

## 2023-07-05 MED ORDER — VENLAFAXINE HCL ER 150 MG PO CP24: 150.0000 mg | ORAL_CAPSULE | Freq: Every day | ORAL | 1 refills | Status: DC

## 2023-07-05 NOTE — Progress Notes (Signed)
 [facility]  Patient ID: Kendra Ball,  MRN: [id number]  PCP: Natalia Leatherwood, DO  Date of Evaluation: 07/05/2023 Time spent:30 minutes  Chief Complaint:  Chief Complaint   Anxiety; Depression; Follow-up; Medication Refill; Patient Education     HISTORY/CURRENT STATUS: HPI "Kendra Ball", 78 year old female presents to this office for follow up and medication management.  Collateral information should be considered reliable.  Doing very well since the holidays. She believes her medications are working well.  She does still endorse heavy cigarette smoking approximately 1 pack/day.  Requesting no changes to her medication this visit. Says that her anxiety is 2/10, and depression is 2/10.  She is sleeping 7 to 8 hours per night with the aid of trazodone.   She denies any history of mania, no psychosis, no auditory or visual hallucinations or delirium.  Denies SI or HI.  She is not requesting refills at this time.   Prior psychiatric medication trials: Xanax-patient previously acknowledged abusing the medication so it was stopped 5 years ago.  Recommended that she not utilize any controlled substance. Sertraline Effexor Trazodone Wellbutrin Individual Medical History/ Review of Systems: Changes? :No   Allergies: Morphine sulfate  Current Medications:  Current Outpatient Medications:    albuterol (VENTOLIN HFA) 108 (90 Base) MCG/ACT inhaler, TAKE 2 PUFFS BY MOUTH EVERY 6 HOURS AS NEEDED FOR WHEEZE OR SHORTNESS OF BREATH, Disp: 8.5 each, Rfl: 5   aspirin EC 81 MG tablet, Take 1 tablet (81 mg total) by mouth daily. Swallow whole., Disp: 90 tablet, Rfl: 3   budesonide-formoterol (SYMBICORT) 160-4.5 MCG/ACT inhaler, Inhale 2 puffs into the lungs in the morning and at bedtime., Disp: 10.2 each, Rfl: 0   buPROPion (WELLBUTRIN SR) 150 MG 12 hr tablet, Take 1 tablet (150 mg total) by mouth 2 (two) times daily., Disp: 180 tablet, Rfl: 1   busPIRone (BUSPAR) 15 MG tablet, Take 1  tablet (15 mg total) by mouth 3 (three) times daily., Disp: 270 tablet, Rfl: 1   Cyanocobalamin 2500 MCG SUBL, 1 tab sublingual daily, Disp: 90 tablet, Rfl: 3   denosumab (PROLIA) 60 MG/ML SOSY injection, Inject 60 mg into the skin every 6 (six) months., Disp: , Rfl:    ferrous fumarate (HEMOCYTE - 106 MG FE) 325 (106 FE) MG TABS, Take 1 tablet by mouth daily. Reported on 10/06/2015, Disp: , Rfl:    levothyroxine (SYNTHROID) 100 MCG tablet, 1 tab PO 6 days a week and 1/2 tab on Sunday., Disp: 90 tablet, Rfl: 3   Magnesium 250 MG TABS, Take 250 mg by mouth daily. , Disp: , Rfl:    ranolazine (RANEXA) 500 MG 12 hr tablet, Take 1 tablet (500 mg total) by mouth 2 (two) times daily., Disp: 180 tablet, Rfl: 1   rosuvastatin (CRESTOR) 10 MG tablet, Take 1 tablet (10 mg total) by mouth daily. 1rst attempt, patient needs and appt for additional refills, Disp: 90 tablet, Rfl: 0   terbinafine (LAMISIL) 250 MG tablet, Take 1 tablet (250 mg total) by mouth daily., Disp: 30 tablet, Rfl: 2   traZODone (DESYREL) 100 MG tablet, Take 2 tablets (200 mg total) by mouth at bedtime., Disp: 180 tablet, Rfl: 1   venlafaxine XR (EFFEXOR-XR) 150 MG 24 hr capsule, Take 1 capsule (150 mg total) by mouth daily with breakfast., Disp: 90 capsule, Rfl: 1 Medication Side Effects: none  Family Medical/ Social History: Changes? No  MENTAL HEALTH EXAM:  There were no vitals taken for this visit.There is no height  or weight on file to calculate BMI.  General Appearance: Casual  Eye Contact:  Good  Speech:  Clear and Coherent  Volume:  Normal  Mood:  NA  Affect:  Appropriate  Thought Process:  Coherent  Orientation:  Full (Time, Place, and Person)  Thought Content: Logical   Suicidal Thoughts:  No  Homicidal Thoughts:  No  Memory:  WNL  Judgement:  Good  Insight:  Good  Psychomotor Activity:  Normal  Concentration:  Concentration: Good  Recall:  Good  Fund of Knowledge: Good  Language: Good  Assets:  Desire for  Improvement  ADL's:  Intact  Cognition: WNL  Prognosis:  Good    DIAGNOSES:    ICD-10-CM   1. Generalized anxiety disorder  F41.1     2. Mild episode of recurrent major depressive disorder (HCC)  F33.0     3. Nicotine use disorder  F17.200     4. MDD (major depressive disorder), recurrent episode, moderate (HCC)  F33.1 traZODone (DESYREL) 100 MG tablet    venlafaxine XR (EFFEXOR-XR) 150 MG 24 hr capsule    5. Alcohol use disorder, moderate, dependence (HCC)  F10.20 traZODone (DESYREL) 100 MG tablet    6. Insomnia due to other mental disorder  F51.05 traZODone (DESYREL) 100 MG tablet   F99     7. GAD (generalized anxiety disorder)  F41.1 venlafaxine XR (EFFEXOR-XR) 150 MG 24 hr capsule      Receiving Psychotherapy: No    RECOMMENDATIONS:   Greater than 50% of 30 min face to face time with patient was spent on counseling and coordination of care. We discussed her continued stability  with anxiety and depression. We discussed her concerns of increased seasonal depression but for now doing very well. Currently happy with her meds.    We agreed today to:   Will continue Effexor 150 mg XR daily Will continue buspirone 15 mg 3 times daily Will continue Wellbutrin 150 mg SR daily Will continue trazodone 200 mg at bedtime daily To follow-up in 6 months to reassess  Provided emergency contact information Will report worsening symptoms or side effects promptly Reviewed PDMP    Joan Flores, NP

## 2023-07-10 ENCOUNTER — Other Ambulatory Visit: Payer: Self-pay | Admitting: Behavioral Health

## 2023-07-10 DIAGNOSIS — F331 Major depressive disorder, recurrent, moderate: Secondary | ICD-10-CM

## 2023-07-10 DIAGNOSIS — F17200 Nicotine dependence, unspecified, uncomplicated: Secondary | ICD-10-CM

## 2023-07-20 ENCOUNTER — Other Ambulatory Visit: Payer: Self-pay | Admitting: Student

## 2023-07-23 ENCOUNTER — Other Ambulatory Visit: Payer: Self-pay | Admitting: Family Medicine

## 2023-07-23 DIAGNOSIS — J4489 Other specified chronic obstructive pulmonary disease: Secondary | ICD-10-CM

## 2023-07-25 ENCOUNTER — Other Ambulatory Visit: Payer: Self-pay

## 2023-07-25 MED ORDER — BUDESONIDE-FORMOTEROL FUMARATE 160-4.5 MCG/ACT IN AERO: 2.0000 | INHALATION_SPRAY | Freq: Two times a day (BID) | RESPIRATORY_TRACT | 0 refills | Status: DC

## 2023-08-07 DIAGNOSIS — L570 Actinic keratosis: Secondary | ICD-10-CM | POA: Diagnosis not present

## 2023-08-07 DIAGNOSIS — D485 Neoplasm of uncertain behavior of skin: Secondary | ICD-10-CM | POA: Diagnosis not present

## 2023-08-07 DIAGNOSIS — L821 Other seborrheic keratosis: Secondary | ICD-10-CM | POA: Diagnosis not present

## 2023-08-07 DIAGNOSIS — L728 Other follicular cysts of the skin and subcutaneous tissue: Secondary | ICD-10-CM | POA: Diagnosis not present

## 2023-08-09 ENCOUNTER — Encounter: Payer: Self-pay | Admitting: Family Medicine

## 2023-08-09 ENCOUNTER — Ambulatory Visit (INDEPENDENT_AMBULATORY_CARE_PROVIDER_SITE_OTHER): Admitting: Family Medicine

## 2023-08-09 VITALS — BP 122/80 | HR 73 | Temp 98.2°F | Wt 164.0 lb

## 2023-08-09 DIAGNOSIS — Z8572 Personal history of non-Hodgkin lymphomas: Secondary | ICD-10-CM

## 2023-08-09 DIAGNOSIS — I890 Lymphedema, not elsewhere classified: Secondary | ICD-10-CM | POA: Diagnosis not present

## 2023-08-09 NOTE — Progress Notes (Signed)
 Kendra Ball , July 06, 1945, 78 y.o., female MRN: [id number] Patient Care Team    Relationship Specialty Notifications Start End  Natalia Leatherwood, DO PCP - General Family Medicine  12/30/14   Charna Elizabeth, MD Consulting Physician Gastroenterology  05/09/14   Charmian Muff, LCAS (Inactive)  Psychology  10/16/15   Oletta Darter, MD (Inactive)  Psychiatry  12/12/16   Ladene Artist, MD Consulting Physician Oncology  04/23/20   Georgeanna Lea, MD Consulting Physician Cardiology  05/12/22     Chief Complaint  Patient presents with   Leaking Fluid    Ongoing for the last year; clear fluid secreting from wounds on arms. Pt states it will go on for a few days once it starts.       Subjective: Kendra Ball is a 78 y.o. Pt presents for an OV with complaints of arm swelling of 1 year or more duration.  Associated symptoms include mild erythema b/l forearms. She reports she notices small area of watery fluid leakage that can occur on both of her arms. Once it is started, it will take a few days for it to stop, seeping through Band-Aid if applied.  She has a h/o lymphoma noted in her neck lymph nodes s/p chemo, excision calvicular nodes and axillary node bx 2019.        12/14/2022    2:21 PM 11/16/2022   10:47 AM 08/01/2022    1:52 PM 06/09/2022   10:18 AM 04/07/2022   10:47 AM  Depression screen PHQ 2/9  Decreased Interest 0 0 0    Down, Depressed, Hopeless 0 0 0    PHQ - 2 Score 0 0 0    Altered sleeping 0 0     Tired, decreased energy 0 2     Change in appetite 0 0     Feeling bad or failure about yourself  0 0     Trouble concentrating 0 0     Moving slowly or fidgety/restless 0 0     Suicidal thoughts 0 0     PHQ-9 Score 0 2        Information is confidential and restricted. Go to Review Flowsheets to unlock data.    Allergies  Allergen Reactions   Morphine Sulfate Swelling and Other (See Comments)    Swelling around injection area   Social History   Social  History Narrative   [name] lives in [place] with her husband. She has 3 grown children and several grand children. She has joint custody of 3 of her grand children as her daughter has bipolar disorder and has needed assistance with children in past.   1-2 cups coffee, 3-6 cups tea per day.   Right-handed.   Past Medical History:  Diagnosis Date   Abnormal mammogram 10/01/2013   Diagnostic [facility] & [place] confirm cyst & duct ectasia of L breast. 08/2013.    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 RF: smoker, fam Hx, sedentary HDL is high!!    Dyspnea on exertion 05/08/2020   Essential hypertension 05/22/2014   Family history of malignant neoplasm of gastrointestinal tract 05/05/2020   Family history of malignant neoplasm of gastrointestinal tract 05/05/2020   Family history of premature coronary heart disease 08/13/2013   Father, brother, sister Personal risk factors: smoker, female over 55, fam Hx, sedentary, thyroid disease, dyslipidemia, BMI over 25.    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      Insomnia 05/05/2015   Iron deficiency anemia 05/05/2020   Lymphoma (HCC) 2019   B-cell   MDD (major depressive disorder), recurrent episode, moderate (HCC) 01/30/2014   [facility] 05/2015 08/18/2015:  Effexor XR 225mg  po qD for depression and anxiety Wellbutrin SR to 200mg  po BID for depression   Xanax 0.5mg  po BID and 1mg  po qHS prn anxiety       Routine PRN Medications:  Yes    Consultations: encouraged to f/up with PCP as needed, encouraged to start individual therapy   Safety Concerns:  Pt denies SI and is at an acute low risk for suicide.Patient told to call clinic i   Multiple fractures 01/29/2019   Obesity    Osteoporosis    Paresthesia 08/06/2019   Port-A-Cath in place 12/27/2017   Pulmonary nodule seen on imaging study 02/13/2015   Smoker 09/14/2015   Substance abuse [facility])    inpatient and outpatient tx for substance abuse   TOBACCO USER 12/25/2009   Tremor 05/23/2019   Past Surgical History:  Procedure Laterality Date   AXILLARY LYMPH NODE BIOPSY Right 06/22/2017   Procedure: RIGHT AXILLARY LYMPH NODE BIOPSY;  Surgeon: Manus Rudd, MD;  Location: [facility];  Service: General;  Laterality: Right;   BARIATRIC SURGERY  2006   CESAREAN SECTION     COLON RESECTION N/A 05/09/2014   Procedure: LAPAROSCOPIC HAND-ASSISTED EXTENDED RIGHT COLECTOMY, LAPAROSCOPIC LYSIS OF ADHESIONS, SPLENIC FLEXURE MOBILIZATION.;  Surgeon: Karie Soda, MD;  Location: [facility];  Service: General;  Laterality: N/A;   DILATION AND CURETTAGE OF UTERUS     x2   FOREARM FRACTURE SURGERY     right   GASTRIC BYPASS  2006   IR IMAGING GUIDED PORT INSERTION  12/01/2017   IR REMOVAL TUN ACCESS W/ PORT W/O FL MOD SED  07/03/2018   MASS EXCISION Right 02/24/2017   Procedure: EXCISIONAL BIOPSY RIGHT SUPRA-CLAVICULAR NODE;  Surgeon: Flo Shanks, MD;  Location: [facility];  Service: ENT;  Laterality: Right;   TONSILLECTOMY     Family History  Problem Relation Age of Onset   Cancer Mother        colon   Depression  Mother    Alzheimer's disease Mother        Dementia   Heart disease Father 24   Heart disease Sister 40       stents, pacemaker   Depression Sister    Heart disease Brother 23       CABG, MI   Depression Brother    Obesity Daughter  Suicidality Daughter    Bipolar disorder Daughter    Obesity Son    Mental illness Daughter        bipolar   Obesity Daughter    Depression Sister    Dementia Paternal Aunt    Alcohol abuse Maternal Uncle    Colon cancer Maternal Aunt    Stomach cancer Maternal Grandfather    Allergies as of 08/09/2023       Reactions   Morphine Sulfate Swelling, Other (See Comments)   Swelling around injection area        Medication List        Accurate as of August 09, 2023  1:52 PM. If you have any questions, ask your nurse or doctor.          STOP taking these medications    Arexvy 120 MCG/0.5ML injection Generic drug: RSV vaccine recomb adjuvanted Stopped by: Felix Pacini   Fluzone High-Dose 0.5 ML injection Generic drug: Influenza vac split quadrivalent PF Stopped by: Felix Pacini   Prevnar 20 0.5 ML injection Generic drug: pneumococcal 20-valent conjugate vaccine Stopped by: Felix Pacini   terbinafine 250 MG tablet Commonly known as: LAMISIL Stopped by: Felix Pacini       TAKE these medications    albuterol 108 (90 Base) MCG/ACT inhaler Commonly known as: VENTOLIN HFA TAKE 2 PUFFS BY MOUTH EVERY 6 HOURS AS NEEDED FOR WHEEZE OR SHORTNESS OF BREATH   aspirin EC 81 MG tablet Take 1 tablet (81 mg total) by mouth daily. Swallow whole.   budesonide-formoterol 160-4.5 MCG/ACT inhaler Commonly known as: Symbicort Inhale 2 puffs into the lungs in the morning and at bedtime.   buPROPion 150 MG 12 hr tablet Commonly known as: WELLBUTRIN SR TAKE 1 TABLET BY MOUTH TWICE A DAY   busPIRone 15 MG tablet Commonly known as: BUSPAR Take 1 tablet (15 mg total) by mouth 3 (three) times daily.   clotrimazole 1 % cream Commonly known  as: LOTRIMIN Apply topically 2 (two) times daily.   Cyanocobalamin 2500 MCG Subl 1 tab sublingual daily   denosumab 60 MG/ML Sosy injection Commonly known as: PROLIA Inject 60 mg into the skin every 6 (six) months.   ferrous fumarate 325 (106 Fe) MG Tabs tablet Commonly known as: HEMOCYTE - 106 mg FE Take 1 tablet by mouth daily. Reported on 10/06/2015   levothyroxine 100 MCG tablet Commonly known as: SYNTHROID 1 tab PO 6 days a week and 1/2 tab on Sunday.   Magnesium 250 MG Tabs Take 250 mg by mouth daily.   ranolazine 500 MG 12 hr tablet Commonly known as: RANEXA TAKE 1 TABLET BY MOUTH TWICE A DAY   rosuvastatin 10 MG tablet Commonly known as: CRESTOR Take 1 tablet (10 mg total) by mouth daily. 1rst attempt, patient needs and appt for additional refills   Spikevax syringe Generic drug: COVID-19 mRNA vaccine   traZODone 100 MG tablet Commonly known as: DESYREL Take 2 tablets (200 mg total) by mouth at bedtime.   venlafaxine XR 150 MG 24 hr capsule Commonly known as: EFFEXOR-XR Take 1 capsule (150 mg total) by mouth daily with breakfast.        All past medical history, surgical history, allergies, family history, immunizations andmedications were updated in the EMR today and reviewed under the history and medication portions of their EMR.     ROS Negative, with the exception of above mentioned in HPI   Objective:  BP 122/80   Pulse 73   Temp 98.2  F (36.8 C)   Wt 164 lb (74.4 kg)   SpO2 95%   BMI 28.15 kg/m  Body mass index is 28.15 kg/m.  Physical Exam Vitals and nursing note reviewed.  Constitutional:      General: She is not in acute distress.    Appearance: Normal appearance. She is normal weight. She is not ill-appearing or toxic-appearing.  HENT:     Head: Normocephalic and atraumatic.  Eyes:     General: No scleral icterus.       Right eye: No discharge.        Left eye: No discharge.     Extraocular Movements: Extraocular movements  intact.     Conjunctiva/sclera: Conjunctivae normal.     Pupils: Pupils are equal, round, and reactive to light.  Musculoskeletal:     Comments: Mild erythema b/l forearms, no drainage present currently. No signs of infection. Mild edema right and left forearms, Left>right. Good cap refill. NV intact distally.  Skin:    Findings: No rash.  Neurological:     Mental Status: She is alert and oriented to person, place, and time. Mental status is at baseline.     Motor: No weakness.     Coordination: Coordination normal.     Gait: Gait normal.  Psychiatric:        Mood and Affect: Mood normal.        Behavior: Behavior normal.        Thought Content: Thought content normal.        Judgment: Judgment normal.     No results found. No results found. No results found for this or any previous visit (from the past 24 hours).  Assessment/Plan: Kendra Ball is a 78 y.o. female present for OV for  Lymphedema- b/l arms L>R (Primary) Exam consistent with mild lymphedema s/p lymphoma treatment.  We discussed lymphedema compression sleeves 20-30 mmHg and she was provided with examples.  No infection present today.  We discussed lymphatic massage.    Reviewed expectations re: course of current medical issues. Discussed self-management of symptoms. Outlined signs and symptoms indicating need for more acute intervention. Patient verbalized understanding and all questions were answered. Patient received an After-Visit Summary.    No orders of the defined types were placed in this encounter.  No orders of the defined types were placed in this encounter.  Referral Orders  No referral(s) requested today     Note is dictated utilizing voice recognition software. Although note has been proof read prior to signing, occasional typographical errors still can be missed. If any questions arise, please do not hesitate to call for verification.   electronically signed by:  Napolean Backbone, DO   [facility] - [facility]

## 2023-08-09 NOTE — Patient Instructions (Addendum)
 Purchase an arm compression sleeve for lymphedema 20-30 mmHg recommend.

## 2023-08-13 ENCOUNTER — Other Ambulatory Visit: Payer: Self-pay | Admitting: Family Medicine

## 2023-08-19 ENCOUNTER — Other Ambulatory Visit: Payer: Self-pay | Admitting: Cardiology

## 2023-08-31 ENCOUNTER — Other Ambulatory Visit: Payer: Self-pay | Admitting: Family Medicine

## 2023-09-13 ENCOUNTER — Other Ambulatory Visit: Payer: Self-pay | Admitting: Behavioral Health

## 2023-09-13 DIAGNOSIS — F1020 Alcohol dependence, uncomplicated: Secondary | ICD-10-CM

## 2023-09-13 DIAGNOSIS — F411 Generalized anxiety disorder: Secondary | ICD-10-CM

## 2023-09-13 DIAGNOSIS — F331 Major depressive disorder, recurrent, moderate: Secondary | ICD-10-CM

## 2023-10-04 ENCOUNTER — Other Ambulatory Visit: Payer: Self-pay | Admitting: Behavioral Health

## 2023-10-04 DIAGNOSIS — F331 Major depressive disorder, recurrent, moderate: Secondary | ICD-10-CM

## 2023-10-04 DIAGNOSIS — F5105 Insomnia due to other mental disorder: Secondary | ICD-10-CM

## 2023-10-04 DIAGNOSIS — F1020 Alcohol dependence, uncomplicated: Secondary | ICD-10-CM

## 2023-10-10 ENCOUNTER — Other Ambulatory Visit: Payer: Self-pay | Admitting: Cardiology

## 2023-10-16 ENCOUNTER — Encounter: Payer: Self-pay | Admitting: Family Medicine

## 2023-10-16 ENCOUNTER — Other Ambulatory Visit: Payer: Self-pay

## 2023-10-16 MED ORDER — BUDESONIDE-FORMOTEROL FUMARATE 160-4.5 MCG/ACT IN AERO: 2.0000 | INHALATION_SPRAY | Freq: Two times a day (BID) | RESPIRATORY_TRACT | 0 refills | Status: DC

## 2023-10-17 ENCOUNTER — Encounter: Payer: Self-pay | Admitting: Pharmacist

## 2023-10-17 NOTE — Progress Notes (Signed)
 Pharmacy Quality Measure Review  This patient is appearing on a report for being at risk of failing the adherence measure for cholesterol (statin) medications this calendar year.   Medication: rosuvastatin   Last fill date: 05/22/2023 for 90 day supply per adherence report.   Reviewed refill history and patient actually filled rosuvastatin  again for 90 DS on 10/10/2023  Insurance report was not up to date. No action needed at this time.  Patient has 2 refills remaining on current rosuvastatin  prescription.   Madelin Ray, PharmD Clinical Pharmacist [facility]  Population Health [phone]

## 2023-11-10 ENCOUNTER — Encounter: Payer: Self-pay | Admitting: Advanced Practice Midwife

## 2023-11-14 ENCOUNTER — Other Ambulatory Visit: Payer: Self-pay | Admitting: Family Medicine

## 2023-11-15 ENCOUNTER — Other Ambulatory Visit: Payer: Self-pay | Admitting: Family Medicine

## 2023-12-05 ENCOUNTER — Other Ambulatory Visit: Payer: Self-pay | Admitting: Family Medicine

## 2023-12-05 DIAGNOSIS — R234 Changes in skin texture: Secondary | ICD-10-CM

## 2023-12-10 ENCOUNTER — Other Ambulatory Visit: Payer: Self-pay | Admitting: Behavioral Health

## 2023-12-10 DIAGNOSIS — F331 Major depressive disorder, recurrent, moderate: Secondary | ICD-10-CM

## 2023-12-10 DIAGNOSIS — F1020 Alcohol dependence, uncomplicated: Secondary | ICD-10-CM

## 2023-12-10 DIAGNOSIS — F411 Generalized anxiety disorder: Secondary | ICD-10-CM

## 2023-12-14 ENCOUNTER — Other Ambulatory Visit: Payer: Self-pay | Admitting: Family Medicine

## 2023-12-20 ENCOUNTER — Ambulatory Visit (INDEPENDENT_AMBULATORY_CARE_PROVIDER_SITE_OTHER): Payer: Medicare PPO | Admitting: *Deleted

## 2023-12-20 VITALS — Ht 64.0 in | Wt 152.0 lb

## 2023-12-20 DIAGNOSIS — Z0000 Encounter for general adult medical examination without abnormal findings: Secondary | ICD-10-CM | POA: Diagnosis not present

## 2023-12-20 DIAGNOSIS — Z780 Asymptomatic menopausal state: Secondary | ICD-10-CM | POA: Diagnosis not present

## 2023-12-20 NOTE — Patient Instructions (Signed)
 Kendra Ball , Thank you for taking time to come for your Medicare Wellness Visit. I appreciate your ongoing commitment to your health goals. Please review the following plan we discussed and let me know if I can assist you in the future.   Screening recommendations/referrals: Colonoscopy: no longer required Mammogram: scheduled Bone Density: Education provided Recommended yearly ophthalmology/optometry visit for glaucoma screening and checkup Recommended yearly dental visit for hygiene and checkup  Vaccinations: Influenza vaccine:  Pneumococcal vaccine:  Tdap vaccine: Shingles vaccine:   Preventive Care 65 Years and Older, Female Preventive care refers to lifestyle choices and visits with your health care provider that can promote health and wellness. What does preventive care include? A yearly physical exam. This is also called an annual well check. Dental exams once or twice a year. Routine eye exams. Ask your health care provider how often you should have your eyes checked. Personal lifestyle choices, including: Daily care of your teeth and gums. Regular physical activity. Eating a healthy diet. Avoiding tobacco and drug use. Limiting alcohol  use. Practicing safe sex. Taking low-dose aspirin  every day. Taking vitamin and mineral supplements as recommended by your health care provider. What happens during an annual well check? The services and screenings done by your health care provider during your annual well check will depend on your age, overall health, lifestyle risk factors, and family history of disease. Counseling  Your health care provider may ask you questions about your: Alcohol  use. Tobacco use. Drug use. Emotional well-being. Home and relationship well-being. Sexual activity. Eating habits. History of falls. Memory and ability to understand (cognition). Work and work Astronomer. Reproductive health. Screening  You may have the following tests or  measurements: Height, weight, and BMI. Blood pressure. Lipid and cholesterol levels. These may be checked every 5 years, or more frequently if you are over 13 years old. Skin check. Lung cancer screening. You may have this screening every year starting at age 58 if you have a 30-pack-year history of smoking and currently smoke or have quit within the past 15 years. Fecal occult blood test (FOBT) of the stool. You may have this test every year starting at age 36. Flexible sigmoidoscopy or colonoscopy. You may have a sigmoidoscopy every 5 years or a colonoscopy every 10 years starting at age 15. Hepatitis C blood test. Hepatitis B blood test. Sexually transmitted disease (STD) testing. Diabetes screening. This is done by checking your blood sugar (glucose) after you have not eaten for a while (fasting). You may have this done every 1-3 years. Bone density scan. This is done to screen for osteoporosis. You may have this done starting at age 40. Mammogram. This may be done every 1-2 years. Talk to your health care provider about how often you should have regular mammograms. Talk with your health care provider about your test results, treatment options, and if necessary, the need for more tests. Vaccines  Your health care provider may recommend certain vaccines, such as: Influenza vaccine. This is recommended every year. Tetanus, diphtheria, and acellular pertussis (Tdap, Td) vaccine. You may need a Td booster every 10 years. Zoster vaccine. You may need this after age 61. Pneumococcal 13-valent conjugate (PCV13) vaccine. One dose is recommended after age 74. Pneumococcal polysaccharide (PPSV23) vaccine. One dose is recommended after age 48. Talk to your health care provider about which screenings and vaccines you need and how often you need them. This information is not intended to replace advice given to you by your health care provider. Make  sure you discuss any questions you have with your  health care provider. Document Released: 05/08/2015 Document Revised: 12/30/2015 Document Reviewed: 02/10/2015 Elsevier Interactive Patient Education  2017 [employer].  Fall Prevention in the Home Falls can cause injuries. They can happen to people of all ages. There are many things you can do to make your home safe and to help prevent falls. What can I do on the outside of my home? Regularly fix the edges of walkways and driveways and fix any cracks. Remove anything that might make you trip as you walk through a door, such as a raised step or threshold. Trim any bushes or trees on the path to your home. Use bright outdoor lighting. Clear any walking paths of anything that might make someone trip, such as rocks or tools. Regularly check to see if handrails are loose or broken. Make sure that both sides of any steps have handrails. Any raised decks and porches should have guardrails on the edges. Have any leaves, snow, or ice cleared regularly. Use sand or salt on walking paths during winter. Clean up any spills in your garage right away. This includes oil or grease spills. What can I do in the bathroom? Use night lights. Install grab bars by the toilet and in the tub and shower. Do not use towel bars as grab bars. Use non-skid mats or decals in the tub or shower. If you need to sit down in the shower, use a plastic, non-slip stool. Keep the floor dry. Clean up any water that spills on the floor as soon as it happens. Remove soap buildup in the tub or shower regularly. Attach bath mats securely with double-sided non-slip rug tape. Do not have throw rugs and other things on the floor that can make you trip. What can I do in the bedroom? Use night lights. Make sure that you have a light by your bed that is easy to reach. Do not use any sheets or blankets that are too big for your bed. They should not hang down onto the floor. Have a firm chair that has side arms. You can use this for  support while you get dressed. Do not have throw rugs and other things on the floor that can make you trip. What can I do in the kitchen? Clean up any spills right away. Avoid walking on wet floors. Keep items that you use a lot in easy-to-reach places. If you need to reach something above you, use a strong step stool that has a grab bar. Keep electrical cords out of the way. Do not use floor polish or wax that makes floors slippery. If you must use wax, use non-skid floor wax. Do not have throw rugs and other things on the floor that can make you trip. What can I do with my stairs? Do not leave any items on the stairs. Make sure that there are handrails on both sides of the stairs and use them. Fix handrails that are broken or loose. Make sure that handrails are as long as the stairways. Check any carpeting to make sure that it is firmly attached to the stairs. Fix any carpet that is loose or worn. Avoid having throw rugs at the top or bottom of the stairs. If you do have throw rugs, attach them to the floor with carpet tape. Make sure that you have a light switch at the top of the stairs and the bottom of the stairs. If you do not have them, ask  someone to add them for you. What else can I do to help prevent falls? Wear shoes that: Do not have high heels. Have rubber bottoms. Are comfortable and fit you well. Are closed at the toe. Do not wear sandals. If you use a stepladder: Make sure that it is fully opened. Do not climb a closed stepladder. Make sure that both sides of the stepladder are locked into place. Ask someone to hold it for you, if possible. Clearly mark and make sure that you can see: Any grab bars or handrails. First and last steps. Where the edge of each step is. Use tools that help you move around (mobility aids) if they are needed. These include: Canes. Walkers. Scooters. Crutches. Turn on the lights when you go into a dark area. Replace any light bulbs as soon  as they burn out. Set up your furniture so you have a clear path. Avoid moving your furniture around. If any of your floors are uneven, fix them. If there are any pets around you, be aware of where they are. Review your medicines with your doctor. Some medicines can make you feel dizzy. This can increase your chance of falling. Ask your doctor what other things that you can do to help prevent falls. This information is not intended to replace advice given to you by your health care provider. Make sure you discuss any questions you have with your health care provider. Document Released: 02/05/2009 Document Revised: 09/17/2015 Document Reviewed: 05/16/2014 Elsevier Interactive Patient Education  2017 [employer].

## 2023-12-20 NOTE — Progress Notes (Signed)
 Subjective:   Kendra Ball is a 78 y.o. female who presents for Medicare Annual (Subsequent) preventive examination.  Visit Complete: Virtual I connected with  Kendra Ball on 12/20/23 by a audio enabled telemedicine application and verified that I am speaking with the correct person using two identifiers.  Patient Location: Home  Provider Location: Home Office  I discussed the limitations of evaluation and management by telemedicine. The patient expressed understanding and agreed to proceed.  Vital Signs: Because this visit was a virtual/telehealth visit, some criteria may be missing or patient reported. Any vitals not documented were not able to be obtained and vitals that have been documented are patient reported.    Cardiac Risk Factors include: advanced age (>12men, >35 women);obesity (BMI >30kg/m2)     Objective:    Today's Vitals   12/20/23 1437  Weight: 152 lb (68.9 kg)  Height: 5' 4 (1.626 m)   Body mass index is 26.09 kg/m.     12/20/2023    2:34 PM 12/14/2022    2:22 PM 11/25/2022   10:02 AM 08/25/2022   10:17 AM 03/10/2022   11:13 AM 12/01/2021    2:43 PM 09/07/2021   10:49 AM  Advanced Directives  Does Patient Have a Medical Advance Directive? No No No No No Yes No  Does patient want to make changes to medical advance directive?      Yes (MAU/Ambulatory/Procedural Areas - Information given)   Would patient like information on creating a medical advance directive? No - Patient declined No - Patient declined   Yes (MAU/Ambulatory/Procedural Areas - Information given)  No - Patient declined    Current Medications (verified) Outpatient Encounter Medications as of 12/20/2023  Medication Sig   albuterol  (VENTOLIN  HFA) 108 (90 Base) MCG/ACT inhaler TAKE 2 PUFFS BY MOUTH EVERY 6 HOURS AS NEEDED FOR WHEEZE OR SHORTNESS OF BREATH   aspirin  EC 81 MG tablet Take 1 tablet (81 mg total) by mouth daily. Swallow whole.   buPROPion  (WELLBUTRIN  SR) 150 MG 12 hr  tablet TAKE 1 TABLET BY MOUTH TWICE A DAY   busPIRone  (BUSPAR ) 15 MG tablet TAKE 1 TABLET BY MOUTH THREE TIMES A DAY   clotrimazole  (LOTRIMIN ) 1 % cream Apply topically 2 (two) times daily.   Cyanocobalamin  2500 MCG SUBL 1 tab sublingual daily   denosumab  (PROLIA ) 60 MG/ML SOSY injection Inject 60 mg into the skin every 6 (six) months.   ferrous fumarate  (HEMOCYTE - 106 MG FE) 325 (106 FE) MG TABS Take 1 tablet by mouth daily. Reported on 10/06/2015   levothyroxine  (SYNTHROID ) 100 MCG tablet TAKE 1 TAB BY MOUTH 6 DAYS A WEEK AND 1/2 TAB ON SUNDAY.   Magnesium 250 MG TABS Take 250 mg by mouth daily.    ranolazine  (RANEXA ) 500 MG 12 hr tablet TAKE 1 TABLET BY MOUTH TWICE A DAY   rosuvastatin  (CRESTOR ) 10 MG tablet Take 1 tablet (10 mg total) by mouth daily.   SPIKEVAX syringe    SYMBICORT  160-4.5 MCG/ACT inhaler INHALE 2 PUFFS INTO THE LUNGS 2 (TWO) TIMES DAILY. IN THE MORNING AND AT BEDTIME.   traZODone  (DESYREL ) 100 MG tablet TAKE 2 TABLETS BY MOUTH AT BEDTIME.   venlafaxine  XR (EFFEXOR -XR) 150 MG 24 hr capsule Take 1 capsule (150 mg total) by mouth daily with breakfast.   No facility-administered encounter medications on file as of 12/20/2023.    Allergies (verified) Morphine sulfate   History: Past Medical History:  Diagnosis Date   Abnormal mammogram 10/01/2013  Diagnostic MMG & US  confirm cyst & duct ectasia of L breast. 08/2013.    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 RF: smoker, fam Hx, sedentary HDL is high!!    Dyspnea on exertion 05/08/2020   Essential hypertension 05/22/2014   Family history of malignant neoplasm of gastrointestinal tract 05/05/2020   Family history of malignant neoplasm of gastrointestinal tract 05/05/2020   Family history of premature coronary heart disease 08/13/2013   Father, brother, sister Personal risk factors: smoker, female over 61, fam Hx, sedentary, thyroid  disease, dyslipidemia, BMI over 25.    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     Insomnia 05/05/2015   Iron deficiency anemia 05/05/2020   Lymphoma (HCC) 2019   B-cell   MDD (major depressive disorder), recurrent episode, moderate (HCC) 01/30/2014   [facility] 05/2015 08/18/2015:  Effexor  XR 225mg  po qD for depression and anxiety Wellbutrin  SR to 200mg  po BID for depression   Xanax  0.5mg  po BID and 1mg  po qHS prn anxiety       Routine PRN Medications:  Yes    Consultations: encouraged to f/up with PCP as needed, encouraged to start individual therapy   Safety Concerns:  Pt denies SI and is at an acute low risk for suicide.Patient told to call clinic i   Multiple fractures 01/29/2019   Obesity    Osteoporosis    Paresthesia 08/06/2019   Port-A-Cath in place 12/27/2017   Pulmonary nodule seen on imaging study 02/13/2015   Smoker 09/14/2015   Substance abuse [facility])    inpatient and outpatient tx for substance abuse   TOBACCO USER 12/25/2009   Tremor 05/23/2019   Past Surgical History:  Procedure Laterality Date   AXILLARY LYMPH NODE BIOPSY Right 06/22/2017   Procedure: RIGHT AXILLARY LYMPH NODE  BIOPSY;  Surgeon: Belinda Cough, MD;  Location: [facility];  Service: General;  Laterality: Right;   BARIATRIC SURGERY  2006   CESAREAN SECTION     COLON RESECTION N/A 05/09/2014   Procedure: LAPAROSCOPIC HAND-ASSISTED EXTENDED RIGHT COLECTOMY, LAPAROSCOPIC LYSIS OF ADHESIONS, SPLENIC FLEXURE MOBILIZATION.;  Surgeon: Elspeth Schultze, MD;  Location: [facility];  Service: General;  Laterality: N/A;   DILATION AND CURETTAGE OF UTERUS     x2   FOREARM FRACTURE SURGERY     right   GASTRIC BYPASS  2006   IR IMAGING GUIDED PORT INSERTION  12/01/2017   IR REMOVAL TUN ACCESS W/ PORT W/O FL MOD SED  07/03/2018   MASS EXCISION Right 02/24/2017   Procedure: EXCISIONAL BIOPSY RIGHT SUPRA-CLAVICULAR  NODE;  Surgeon: Arlana Arnt, MD;  Location: [facility];  Service: ENT;  Laterality: Right;   TONSILLECTOMY     Family History  Problem Relation Age of Onset   Cancer Mother        colon   Depression Mother    Alzheimer's disease Mother        Dementia   Heart disease Father 62   Heart disease Sister 40       stents, pacemaker   Depression Sister    Heart disease Brother 80       CABG, MI   Depression Brother    Obesity Daughter    Suicidality Daughter    Bipolar disorder Daughter    Obesity Son    Mental illness Daughter        bipolar   Obesity Daughter    Depression Sister    Dementia Paternal Aunt    Alcohol  abuse Maternal Uncle    Colon cancer Maternal Aunt    Stomach cancer Maternal Grandfather    Social History   Socioeconomic History   Marital status: Married    Spouse name: Therapist, nutritional   Number of children: 3   Years of education: Not on file   Highest education level: Some college, no degree  Occupational History   Occupation: RETIRED    Employer: [employer]  Tobacco Use   Smoking status: Every Day    Current packs/day: 1.00    Average packs/day: 1 pack/day for 109.7 years (109.7 ttl pk-yrs)    Types: Cigarettes    Start date: 1966   Smokeless tobacco: Never    Tobacco comments:    9 cigs/day on 05/06/2021. Back up to 1ppd in 03/2022  Vaping Use   Vaping status: Never Used  Substance and Sexual Activity   Alcohol  use: No    Comment: quit on Aug 27, 2015   Drug use: No    Comment: last used May 2017   Sexual activity: Not Currently    Partners: Male    Birth control/protection: Post-menopausal  Other Topics Concern   Not on file  Social History Narrative   Ms. Gindlesperger lives in [place] with her husband. She has 3 grown children and several grand children. She has joint custody of 3 of her grand children as her daughter has bipolar disorder and has needed assistance with children in past.   1-2 cups coffee, 3-6 cups tea per day.   Right-handed.   Social Drivers of Corporate investment banker Strain: Low Risk  (12/20/2023)   Overall Financial Resource Strain (CARDIA)    Difficulty of Paying Living Expenses: Not hard at all  Food Insecurity: No Food Insecurity (12/20/2023)   Hunger Vital Sign    Worried About Running Out of Food in the Last Year: Never true    Ran Out of Food in the Last Year: Never true  Transportation Needs: No Transportation Needs (12/20/2023)   PRAPARE - Administrator, Civil Service (Medical): No    Lack of Transportation (Non-Medical): No  Physical Activity: Inactive (12/20/2023)   Exercise Vital Sign    Days of Exercise per Week: 0 days    Minutes of Exercise per Session: 0 min  Stress: No Stress Concern Present (12/20/2023)   [employer] of Occupational Health - Occupational Stress Questionnaire    Feeling of Stress: Not at all  Social Connections: Moderately Integrated (12/20/2023)   Social Connection and Isolation Panel    Frequency of  Communication with Friends and Family: More than three times a week    Frequency of Social Gatherings with Friends and Family: More than three times a week    Attends Religious Services: Never    Database administrator or Organizations: Yes    Attends Museum/gallery exhibitions officer: More than 4 times per year    Marital Status: Married    Tobacco Counseling Ready to quit: Not Answered Counseling given: Not Answered Tobacco comments: 9 cigs/day on 05/06/2021. Back up to 1ppd in 03/2022   Clinical Intake:  Pre-visit preparation completed: Yes  Pain : No/denies pain     Diabetes: No  How often do you need to have someone help you when you read instructions, pamphlets, or other written materials from your doctor or pharmacy?: 1 - Never  Interpreter Needed?: No  Information entered by :: Mliss Graff LPN   Activities of Daily Living    12/20/2023    2:36 PM  In your present state of health, do you have any difficulty performing the following activities:  Hearing? 0  Vision? 0  Difficulty concentrating or making decisions? 0  Walking or climbing stairs? 0  Dressing or bathing? 0  Doing errands, shopping? 0  Preparing Food and eating ? N  Using the Toilet? N  In the past six months, have you accidently leaked urine? Y  Do you have problems with loss of bowel control? N  Managing your Medications? N  Managing your Finances? N  Housekeeping or managing your Housekeeping? N    Patient Care Team: Catherine Charlies LABOR, DO as PCP - General (Family Medicine) Kristie Lamprey, MD as Consulting Physician (Gastroenterology) Janit Caldron, LCAS (Inactive) (Psychology) Brutus Dales, MD (Inactive) (Psychiatry) Cloretta Arley NOVAK, MD as Consulting Physician (Oncology) Bernie Lamar PARAS, MD as Consulting Physician (Cardiology)  Indicate any recent Medical Services you may have received from other than [facility] providers in the past year (date may be approximate).     Assessment:   This is a routine wellness examination for [place].  Hearing/Vision screen Hearing Screening - Comments:: No trouble hearing Vision Screening - Comments:: Up to date Unsure of name   Goals Addressed             This Visit's Progress    Increase physical  activity   Not on track    Patient Stated   Not on track    Stop smoking      Patient Stated   On track    To lose a little weight      Quit Smoking       Quit smoking / using tobacco   Not on track    Quit smoking.      Reduce alcohol  intake to 0 servings per day   On track      Depression Screen    12/20/2023    2:40 PM 12/14/2022    2:21 PM 11/16/2022   10:47 AM 08/01/2022    1:52 PM 06/09/2022   10:18 AM 04/07/2022   10:47 AM 12/01/2021    2:42 PM  PHQ 2/9 Scores  PHQ - 2 Score 0 0 0 0   0  PHQ- 9 Score 0 0 2         Information is confidential and restricted. Go to Review Flowsheets to unlock data.    Fall Risk    12/20/2023    2:34 PM 12/13/2022   11:03 AM 11/16/2022   10:46 AM 11/16/2022  8:53 AM 12/01/2021    2:43 PM  Fall Risk   Falls in the past year? 0 0 0 0 0  Number falls in past yr: 0  0  0  Injury with Fall? 0  0  0  Risk for fall due to :  Impaired vision   Impaired vision;Impaired balance/gait  Follow up Falls evaluation completed;Education provided;Falls prevention discussed Falls prevention discussed   Falls prevention discussed      Data saved with a previous flowsheet row definition    MEDICARE RISK AT HOME: Medicare Risk at Home Any stairs in or around the home?: No If so, are there any without handrails?: No Home free of loose throw rugs in walkways, pet beds, electrical cords, etc?: Yes Adequate lighting in your home to reduce risk of falls?: Yes Life alert?: No Use of a cane, walker or w/c?: No Grab bars in the bathroom?: No Shower chair or bench in shower?: No Elevated toilet seat or a handicapped toilet?: No  TIMED UP AND GO:  Was the test performed?  No    Cognitive Function:        12/20/2023    2:36 PM 12/14/2022    2:23 PM 12/01/2021    2:45 PM  6CIT Screen  What Year? 0 points 0 points 0 points  What month? 0 points 0 points 0 points  What time? 0 points 0 points 0 points  Count back from 20 0 points 0 points 0 points   Months in reverse 0 points 0 points 0 points  Repeat phrase 0 points 0 points 0 points  Total Score 0 points 0 points 0 points    Immunizations Immunization History  Administered Date(s) Administered   Fluad Quad(high Dose 65+) 01/29/2019, 01/19/2021   INFLUENZA, HIGH DOSE SEASONAL PF 05/25/2016, 02/16/2017, 02/23/2023   Influenza Whole 05/07/2010   Influenza,inj,Quad PF,6+ Mos 03/13/2014, 12/30/2014, 02/05/2018   Influenza-Unspecified 03/03/2020, 01/23/2022   Moderna Covid Bivalent Peds Booster(21mo Thru 17yrs) 01/23/2022   Moderna Covid-19 Fall Seasonal Vaccine 26yrs & older 02/23/2023   Moderna Sars-Covid-2 Vaccination 05/24/2019, 06/21/2019   PFIZER Comirnaty(Gray Top)Covid-19 Tri-Sucrose Vaccine 11/26/2020   PFIZER(Purple Top)SARS-COV-2 Vaccination 03/03/2020   PNEUMOCOCCAL CONJUGATE-20 05/10/2023   PPD Test 09/20/2013   Pneumococcal Conjugate-13 09/10/2014   Pneumococcal Polysaccharide-23 08/13/2013   Respiratory Syncytial Virus Vaccine,Recomb Aduvanted(Arexvy) 05/10/2023   Tdap 08/08/2005    TDAP status: Due, Education has been provided regarding the importance of this vaccine. Advised may receive this vaccine at local pharmacy or Health Dept. Aware to provide a copy of the vaccination record if obtained from local pharmacy or Health Dept. Verbalized acceptance and understanding.  Flu Vaccine status: Due, Education has been provided regarding the importance of this vaccine. Advised may receive this vaccine at local pharmacy or Health Dept. Aware to provide a copy of the vaccination record if obtained from local pharmacy or Health Dept. Verbalized acceptance and understanding.  Pneumococcal vaccine status: Up to date  Covid-19 vaccine status: Information provided on how to obtain vaccines.   Qualifies for Shingles Vaccine? Yes   Zostavax completed No   Shingrix Completed?: No.    Education has been provided regarding the importance of this vaccine. Patient has been advised  to call insurance company to determine out of pocket expense if they have not yet received this vaccine. Advised may also receive vaccine at local pharmacy or Health Dept. Verbalized acceptance and understanding.  Screening Tests Health Maintenance  Topic Date Due   DEXA SCAN  11/17/2023  Lung Cancer Screening  12/03/2023   INFLUENZA VACCINE  11/24/2023   Medicare Annual Wellness (AWV)  12/19/2024   Pneumococcal Vaccine: 50+ Years  Completed   Hepatitis C Screening  Completed   HPV VACCINES  Aged Out   Meningococcal B Vaccine  Aged Out   DTaP/Tdap/Td  Discontinued   Colonoscopy  Discontinued   COVID-19 Vaccine  Discontinued   Zoster Vaccines- Shingrix  Discontinued    Health Maintenance  Health Maintenance Due  Topic Date Due   DEXA SCAN  11/17/2023   Lung Cancer Screening  12/03/2023   INFLUENZA VACCINE  11/24/2023    Colorectal cancer screening: No longer required.   Mammogram  scheduled  Bone Density status: Ordered  . Pt provided with contact info and advised to call to schedule appt.  Lung Cancer Screening: (Low Dose CT Chest recommended if Age 32-80 years, 20 pack-year currently smoking OR have quit w/in 15years.) does qualify.   Lung Cancer Screening Referral:   Additional Screening:  Hepatitis C Screening: does not qualify; Completed 2019  Vision Screening: Recommended annual ophthalmology exams for early detection of glaucoma and other disorders of the eye. Is the patient up to date with their annual eye exam?  Yes  Who is the provider or what is the name of the office in which the patient attends annual eye exams? Unsure of name If pt is not established with a provider, would they like to be referred to a provider to establish care? No .   Dental Screening: Recommended annual dental exams for proper oral hygiene    Community Resource Referral / Chronic Care Management: CRR required this visit?  No   CCM required this visit?  No     Plan:     I  have personally reviewed and noted the following in the patient's chart:   Medical and social history Use of alcohol , tobacco or illicit drugs  Current medications and supplements including opioid prescriptions. Patient is not currently taking opioid prescriptions. Functional ability and status Nutritional status Physical activity Advanced directives List of other physicians Hospitalizations, surgeries, and ER visits in previous 12 months Vitals Screenings to include cognitive, depression, and falls Referrals and appointments  In addition, I have reviewed and discussed with patient certain preventive protocols, quality metrics, and best practice recommendations. A written personalized care plan for preventive services as well as general preventive health recommendations were provided to patient.     Mliss Graff, LPN   [id number]/[id number]   After Visit Summary: (MyChart) Due to this being a telephonic visit, the after visit summary with patients personalized plan was offered to patient via MyChart   Nurse Notes:

## 2023-12-27 ENCOUNTER — Other Ambulatory Visit: Payer: Self-pay

## 2023-12-27 DIAGNOSIS — Z122 Encounter for screening for malignant neoplasm of respiratory organs: Secondary | ICD-10-CM

## 2023-12-27 DIAGNOSIS — F17210 Nicotine dependence, cigarettes, uncomplicated: Secondary | ICD-10-CM

## 2023-12-27 DIAGNOSIS — Z87891 Personal history of nicotine dependence: Secondary | ICD-10-CM

## 2024-01-03 ENCOUNTER — Ambulatory Visit: Admitting: Behavioral Health

## 2024-01-04 ENCOUNTER — Other Ambulatory Visit: Payer: Self-pay | Admitting: Acute Care

## 2024-01-04 ENCOUNTER — Other Ambulatory Visit: Payer: Self-pay | Admitting: Behavioral Health

## 2024-01-04 ENCOUNTER — Other Ambulatory Visit: Payer: Self-pay | Admitting: Student

## 2024-01-04 DIAGNOSIS — Z122 Encounter for screening for malignant neoplasm of respiratory organs: Secondary | ICD-10-CM

## 2024-01-04 DIAGNOSIS — F411 Generalized anxiety disorder: Secondary | ICD-10-CM

## 2024-01-04 DIAGNOSIS — F17210 Nicotine dependence, cigarettes, uncomplicated: Secondary | ICD-10-CM

## 2024-01-04 DIAGNOSIS — F331 Major depressive disorder, recurrent, moderate: Secondary | ICD-10-CM

## 2024-01-04 DIAGNOSIS — Z87891 Personal history of nicotine dependence: Secondary | ICD-10-CM

## 2024-01-05 ENCOUNTER — Ambulatory Visit: Payer: Self-pay | Admitting: Family Medicine

## 2024-01-05 ENCOUNTER — Ambulatory Visit
Admission: RE | Admit: 2024-01-05 | Discharge: 2024-01-05 | Disposition: A | Discharge status: Home / Self Care | Source: Ambulatory Visit | Attending: Family Medicine | Admitting: Family Medicine

## 2024-01-05 DIAGNOSIS — R234 Changes in skin texture: Secondary | ICD-10-CM

## 2024-01-05 DIAGNOSIS — R928 Other abnormal and inconclusive findings on diagnostic imaging of breast: Secondary | ICD-10-CM | POA: Diagnosis not present

## 2024-01-05 DIAGNOSIS — R92323 Mammographic fibroglandular density, bilateral breasts: Secondary | ICD-10-CM | POA: Diagnosis not present

## 2024-01-05 DIAGNOSIS — N6489 Other specified disorders of breast: Secondary | ICD-10-CM | POA: Diagnosis not present

## 2024-01-08 ENCOUNTER — Ambulatory Visit: Payer: Self-pay

## 2024-01-08 NOTE — Telephone Encounter (Signed)
 Copied from CRM [id number]. Topic: Clinical - Red Word Triage >> Jan 08, 2024 10:03 AM Rosina BIRCH wrote: Reason for RMF:[id number] stated her voice has been hoarse for three or four days and she has a headache and chest tightness which has just started

## 2024-01-08 NOTE — Telephone Encounter (Signed)
 FYI Only or Action Required?: FYI only for provider.  Patient was last seen in primary care on 08/09/2023 by Catherine Fuller A, DO.  Called Nurse Triage reporting Cough.  Symptoms began several days ago.  Interventions attempted: OTC medications: Ibuprofen.  Symptoms are: gradually worsening.  Triage Disposition: See Physician Within 24 Hours  Patient/caregiver understands and will follow disposition?: Yes     Reason for Disposition  Coughing up rusty-colored (reddish-brown) sputum  Answer Assessment - Initial Assessment Questions Patient denies runny nose or wheezing. Currently taking Ibuprofen for symptoms. Patient states she does not feel so bad overall.    1. ONSET: When did the cough begin?      4 days ago  2. SEVERITY: How bad is the cough today?      Mild  3. SPUTUM: Describe the color of your sputum (e.g., none, dry cough; clear, white, yellow, green)     Light brown  4. HEMOPTYSIS: Are you coughing up any blood? If Yes, ask: How much? (e.g., flecks, streaks, tablespoons, etc.)     Denies  5. DIFFICULTY BREATHING: Are you having difficulty breathing? If Yes, ask: How bad is it? (e.g., mild, moderate, severe)      Moderate 6. FEVER: Do you have a fever? If Yes, ask: What is your temperature, how was it measured, and when did it start?     Denies  7. CARDIAC HISTORY: Do you have any history of heart disease? (e.g., heart attack, congestive heart failure)      Sees cardiology and takes meds for her heart  8. LUNG HISTORY: Do you have any history of lung disease?  (e.g., pulmonary embolus, asthma, emphysema)     COPD  9. PE RISK FACTORS: Do you have a history of blood clots? (or: recent major surgery, recent prolonged travel, bedridden)     Denies  10. OTHER SYMPTOMS: Do you have any other symptoms? (e.g., runny nose, wheezing, chest pain)       Headache, losign voice, chest tightness  Protocols used: Cough - Acute Productive-A-AH

## 2024-01-08 NOTE — Telephone Encounter (Signed)
 No further action needed at this time.

## 2024-01-08 NOTE — Telephone Encounter (Signed)
 Patient was not on phone when [facility] attempted transfer. This RN attempted to contact patient, no answer. LVM. - attempted two back to back calls.

## 2024-01-09 ENCOUNTER — Ambulatory Visit (INDEPENDENT_AMBULATORY_CARE_PROVIDER_SITE_OTHER): Admitting: Family Medicine

## 2024-01-09 ENCOUNTER — Telehealth: Payer: Self-pay

## 2024-01-09 VITALS — BP 125/79 | HR 76 | Temp 98.2°F | Wt 154.0 lb

## 2024-01-09 DIAGNOSIS — J441 Chronic obstructive pulmonary disease with (acute) exacerbation: Secondary | ICD-10-CM

## 2024-01-09 DIAGNOSIS — R051 Acute cough: Secondary | ICD-10-CM

## 2024-01-09 DIAGNOSIS — J4489 Other specified chronic obstructive pulmonary disease: Secondary | ICD-10-CM

## 2024-01-09 LAB — POC COVID19 BINAXNOW: SARS Coronavirus 2 Ag: NEGATIVE

## 2024-01-09 MED ORDER — BUDESONIDE-FORMOTEROL FUMARATE 160-4.5 MCG/ACT IN AERO: 2.0000 | INHALATION_SPRAY | Freq: Two times a day (BID) | RESPIRATORY_TRACT | 11 refills | Status: AC

## 2024-01-09 MED ORDER — DOXYCYCLINE HYCLATE 100 MG PO TABS: 100.0000 mg | ORAL_TABLET | Freq: Two times a day (BID) | ORAL | 0 refills | Status: DC

## 2024-01-09 MED ORDER — ALBUTEROL SULFATE HFA 108 (90 BASE) MCG/ACT IN AERS: 1.0000 | INHALATION_SPRAY | Freq: Four times a day (QID) | RESPIRATORY_TRACT | 11 refills | Status: AC | PRN

## 2024-01-09 MED ORDER — METHYLPREDNISOLONE ACETATE 80 MG/ML IJ SUSP
80.0000 mg | Freq: Once | INTRAMUSCULAR | Status: AC
  Administered 2024-01-09: 80 mg via INTRAMUSCULAR

## 2024-01-09 NOTE — Telephone Encounter (Signed)
 Patient scheduled for CPE on 02/09/24 with Dr. Catherine. She is requesting to have Prolia  injection while she is here for her appt.  Please get insurance approvals if needed. Thank you.

## 2024-01-09 NOTE — Telephone Encounter (Signed)
 Approved until 04/24/24

## 2024-01-09 NOTE — Patient Instructions (Signed)

## 2024-01-09 NOTE — Progress Notes (Signed)
 Kendra Ball , 1945/10/01, 78 y.o., female MRN: [id number] Patient Care Team    Relationship Specialty Notifications Start End  Catherine Charlies DELENA, DO PCP - General Family Medicine  12/30/14   Kristie Lamprey, MD Consulting Physician Gastroenterology  05/09/14   Janit Caldron, LCAS (Inactive)  Psychology  10/16/15   Brutus Dales, MD (Inactive)  Psychiatry  12/12/16   Cloretta Arley NOVAK, MD Consulting Physician Oncology  04/23/20   Bernie Lamar PARAS, MD Consulting Physician Cardiology  05/12/22     Chief Complaint  Patient presents with   Cough    A few days; cough, congestion, loss of voice, body aches. Pt has tried Ibuprofen.      Subjective: Kendra Ball is a 78 y.o. Pt presents for an OV with complaints of cough of 4 days duration.  Associated symptoms include wheezing and short of breath. She reports shortness of breath resolved today. Pt has tried ibf to ease their symptoms.  Patient is a smoker.  Significant history of COPD. Patient is prescribed Symbicort  and albuterol - she has been out of her inhaler  See ROS     12/20/2023    2:40 PM 12/14/2022    2:21 PM 11/16/2022   10:47 AM 08/01/2022    1:52 PM 06/09/2022   10:18 AM  Depression screen PHQ 2/9  Decreased Interest 0 0 0 0   Down, Depressed, Hopeless 0 0 0 0   PHQ - 2 Score 0 0 0 0   Altered sleeping 0 0 0    Tired, decreased energy 0 0 2    Change in appetite 0 0 0    Feeling bad or failure about yourself  0 0 0    Trouble concentrating 0 0 0    Moving slowly or fidgety/restless 0 0 0    Suicidal thoughts 0 0 0    PHQ-9 Score 0 0 2    Difficult doing work/chores Not difficult at all         Information is confidential and restricted. Go to Review Flowsheets to unlock data.    Allergies  Allergen Reactions   Morphine Sulfate Swelling and Other (See Comments)    Swelling around injection area   Social History   Social History Narrative   Kendra Ball lives in [place] with her husband. She has 3  grown children and several grand children. She has joint custody of 3 of her grand children as her daughter has bipolar disorder and has needed assistance with children in past.   1-2 cups coffee, 3-6 cups tea per day.   Right-handed.   Past Medical History:  Diagnosis Date   Abnormal mammogram 10/01/2013   Diagnostic MMG & US  confirm cyst & duct ectasia of L breast. 08/2013.    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 RF: smoker, fam Hx, sedentary HDL is high!!    Dyspnea on  exertion 05/08/2020   Essential hypertension 05/22/2014   Family history of malignant neoplasm of gastrointestinal tract 05/05/2020   Family history of malignant neoplasm of gastrointestinal tract 05/05/2020   Family history of premature coronary heart disease 08/13/2013   Father, brother, sister Personal risk factors: smoker, female over 74, fam Hx, sedentary, thyroid  disease, dyslipidemia, BMI over 25.    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     Insomnia 05/05/2015   Iron deficiency anemia 05/05/2020   Lymphoma (HCC) 2019    B-cell   MDD (major depressive disorder), recurrent episode, moderate (HCC) 01/30/2014   [facility] 05/2015 08/18/2015:  Effexor  XR 225mg  po qD for depression and anxiety Wellbutrin  SR to 200mg  po BID for depression   Xanax  0.5mg  po BID and 1mg  po qHS prn anxiety       Routine PRN Medications:  Yes    Consultations: encouraged to f/up with PCP as needed, encouraged to start individual therapy   Safety Concerns:  Pt denies SI and is at an acute low risk for suicide.Patient told to call clinic i   Multiple fractures 01/29/2019   Obesity    Osteoporosis    Paresthesia 08/06/2019   Port-A-Cath in place 12/27/2017   Pulmonary nodule seen on imaging study 02/13/2015   Smoker 09/14/2015   Substance abuse [facility])    inpatient and outpatient tx for substance abuse   TOBACCO USER 12/25/2009   Tremor 05/23/2019   Past Surgical History:  Procedure Laterality Date   AXILLARY LYMPH NODE BIOPSY Right 06/22/2017   Procedure: RIGHT AXILLARY LYMPH NODE BIOPSY;  Surgeon: Belinda Cough, MD;  Location: [facility];  Service: General;  Laterality: Right;   BARIATRIC SURGERY  2006   CESAREAN SECTION     COLON RESECTION N/A 05/09/2014   Procedure: LAPAROSCOPIC HAND-ASSISTED EXTENDED RIGHT COLECTOMY, LAPAROSCOPIC LYSIS OF ADHESIONS, SPLENIC FLEXURE MOBILIZATION.;  Surgeon: Elspeth Schultze, MD;  Location: [facility];  Service: General;  Laterality: N/A;   DILATION AND CURETTAGE OF UTERUS     x2   FOREARM FRACTURE SURGERY     right   GASTRIC BYPASS  2006   IR IMAGING GUIDED PORT INSERTION  12/01/2017   IR REMOVAL TUN ACCESS W/ PORT W/O FL MOD SED  07/03/2018   MASS EXCISION Right 02/24/2017   Procedure: EXCISIONAL BIOPSY RIGHT SUPRA-CLAVICULAR NODE;  Surgeon: Arlana Arnt, MD;  Location: [facility];  Service: ENT;  Laterality: Right;   TONSILLECTOMY     Family History  Problem Relation Age of Onset   Cancer Mother        colon   Depression Mother    Alzheimer's disease Mother        Dementia   Heart disease Father 55    Heart disease Sister 40       stents, pacemaker   Depression Sister    Heart disease Brother 36       CABG, MI   Depression Brother    Obesity Daughter    Suicidality Daughter    Bipolar disorder Daughter    Obesity Son    Mental illness Daughter  bipolar   Obesity Daughter    Depression Sister    Dementia Paternal Aunt    Alcohol  abuse Maternal Uncle    Colon cancer Maternal Aunt    Stomach cancer Maternal Grandfather    Allergies as of 01/09/2024       Reactions   Morphine Sulfate Swelling, Other (See Comments)   Swelling around injection area        Medication List        Accurate as of January 09, 2024 11:59 AM. If you have any questions, ask your nurse or doctor.          STOP taking these medications    Spikevax syringe Generic drug: COVID-19 mRNA vaccine Stopped by: Charlies Bellini       TAKE these medications    albuterol  108 (90 Base) MCG/ACT inhaler Commonly known as: VENTOLIN  HFA Inhale 1-2 puffs into the lungs every 6 (six) hours as needed for wheezing or shortness of breath. What changed: See the new instructions. Changed by: Charlies Bellini   aspirin  EC 81 MG tablet Take 1 tablet (81 mg total) by mouth daily. Swallow whole.   budesonide -formoterol  160-4.5 MCG/ACT inhaler Commonly known as: Symbicort  Inhale 2 puffs into the lungs in the morning and at bedtime. What changed: See the new instructions. Changed by: Charlies Bellini   buPROPion  150 MG 12 hr tablet Commonly known as: WELLBUTRIN  SR TAKE 1 TABLET BY MOUTH TWICE A DAY   busPIRone  15 MG tablet Commonly known as: BUSPAR  TAKE 1 TABLET BY MOUTH THREE TIMES A DAY   clotrimazole  1 % cream Commonly known as: LOTRIMIN  Apply topically 2 (two) times daily.   Cyanocobalamin  2500 MCG Subl 1 tab sublingual daily   denosumab  60 MG/ML Sosy injection Commonly known as: PROLIA  Inject 60 mg into the skin every 6 (six) months.   doxycycline  100 MG tablet Commonly known as:  VIBRA -TABS Take 1 tablet (100 mg total) by mouth 2 (two) times daily. Started by: Laelle Bridgett   ferrous fumarate  325 (106 Fe) MG Tabs tablet Commonly known as: HEMOCYTE - 106 mg FE Take 1 tablet by mouth daily. Reported on 10/06/2015   levothyroxine  100 MCG tablet Commonly known as: SYNTHROID  TAKE 1 TAB BY MOUTH 6 DAYS A WEEK AND 1/2 TAB ON SUNDAY.   Magnesium 250 MG Tabs Take 250 mg by mouth daily.   ranolazine  500 MG 12 hr tablet Commonly known as: RANEXA  TAKE 1 TABLET BY MOUTH TWICE A DAY   rosuvastatin  10 MG tablet Commonly known as: CRESTOR  Take 1 tablet (10 mg total) by mouth daily.   traZODone  100 MG tablet Commonly known as: DESYREL  TAKE 2 TABLETS BY MOUTH AT BEDTIME.   venlafaxine  XR 150 MG 24 hr capsule Commonly known as: EFFEXOR -XR TAKE 1 CAPSULE BY MOUTH DAILY WITH BREAKFAST.        All past medical history, surgical history, allergies, family history, immunizations andmedications were updated in the EMR today and reviewed under the history and medication portions of their EMR.     Review of Systems  Constitutional:  Positive for malaise/fatigue. Negative for chills and fever.  HENT:  Positive for congestion and sore throat.   Respiratory:  Positive for cough, sputum production and shortness of breath. Negative for wheezing.   Gastrointestinal:  Negative for abdominal pain, diarrhea, nausea and vomiting.  Genitourinary: Negative.   Musculoskeletal:  Positive for myalgias.  Skin:  Negative for rash.  Neurological:  Negative for dizziness and headaches.   Negative, with the  exception of above mentioned in HPI   Objective:  BP 125/79   Pulse 76   Temp 98.2 F (36.8 C)   Wt 154 lb (69.9 kg)   SpO2 96%   BMI 26.43 kg/m  Body mass index is 26.43 kg/m. Physical Exam Vitals and nursing note reviewed.  Constitutional:      General: She is not in acute distress.    Appearance: Normal appearance. She is normal weight. She is not ill-appearing or  toxic-appearing.  HENT:     Head: Normocephalic and atraumatic.     Right Ear: Tympanic membrane, ear canal and external ear normal. There is no impacted cerumen.     Left Ear: Tympanic membrane, ear canal and external ear normal. There is no impacted cerumen.     Nose: Nose normal.     Mouth/Throat:     Mouth: Mucous membranes are moist.     Pharynx: No oropharyngeal exudate or posterior oropharyngeal erythema.  Eyes:     General: No scleral icterus.       Right eye: No discharge.        Left eye: No discharge.     Extraocular Movements: Extraocular movements intact.     Conjunctiva/sclera: Conjunctivae normal.     Pupils: Pupils are equal, round, and reactive to light.  Cardiovascular:     Rate and Rhythm: Normal rate and regular rhythm.     Heart sounds: No murmur heard. Pulmonary:     Effort: Pulmonary effort is normal. No respiratory distress.     Breath sounds: Wheezing and rhonchi present. No rales.  Musculoskeletal:     Cervical back: Neck supple.  Lymphadenopathy:     Cervical: No cervical adenopathy.  Skin:    Findings: No rash.  Neurological:     Mental Status: She is alert and oriented to person, place, and time. Mental status is at baseline.     Motor: No weakness.     Coordination: Coordination normal.     Gait: Gait normal.  Psychiatric:        Mood and Affect: Mood normal.        Behavior: Behavior normal.        Thought Content: Thought content normal.        Judgment: Judgment normal.      No results found. No results found. Results for orders placed or performed in visit on 01/09/24 (from the past 24 hours)  POC COVID-19 BinaxNow     Status: Normal   Collection Time: 01/09/24 11:21 AM  Result Value Ref Range   SARS Coronavirus 2 Ag Negative Negative    Assessment/Plan: ROSALENA MCCORRY is a 78 y.o. female present for OV for  Acute cough - POC COVID-19 BinaxNow> negative  COPD (chronic obstructive pulmonary disease) with chronic bronchitis  (HCC) - albuterol  (VENTOLIN  HFA) 108 (90 Base) MCG/ACT inhaler; Inhale 1-2 puffs into the lungs every 6 (six) hours as needed for wheezing or shortness of breath.  Dispense: 8.5 each; Refill: 11  COPD with exacerbation (HCC) (Primary) Rest, hydrate.  mucinex (DM if cough), nettie pot or nasal saline.  Doxy bid prescribed, take until completed.  IM depo medrol  80 mg injection  Refilled symbicort  and albuterol  for her today F/U 2 weeks of not improved. Otherwise 4 weeks for cpe and cmc   Reviewed expectations re: course of current medical issues. Discussed self-management of symptoms. Outlined signs and symptoms indicating need for more acute intervention. Patient verbalized understanding and all questions were answered.  Patient received an After-Visit Summary.    Orders Placed This Encounter  Procedures   POC COVID-19 BinaxNow   Meds ordered this encounter  Medications   albuterol  (VENTOLIN  HFA) 108 (90 Base) MCG/ACT inhaler    Sig: Inhale 1-2 puffs into the lungs every 6 (six) hours as needed for wheezing or shortness of breath.    Dispense:  8.5 each    Refill:  11   budesonide -formoterol  (SYMBICORT ) 160-4.5 MCG/ACT inhaler    Sig: Inhale 2 puffs into the lungs in the morning and at bedtime.    Dispense:  10.2 each    Refill:  11   doxycycline  (VIBRA -TABS) 100 MG tablet    Sig: Take 1 tablet (100 mg total) by mouth 2 (two) times daily.    Dispense:  20 tablet    Refill:  0   methylPREDNISolone  acetate (DEPO-MEDROL ) injection 80 mg   Referral Orders  No referral(s) requested today     Note is dictated utilizing voice recognition software. Although note has been proof read prior to signing, occasional typographical errors still can be missed. If any questions arise, please do not hesitate to call for verification.   electronically signed by:  Charlies Bellini, DO  [facility] - [place]

## 2024-01-10 ENCOUNTER — Ambulatory Visit: Admitting: Behavioral Health

## 2024-01-17 ENCOUNTER — Ambulatory Visit (INDEPENDENT_AMBULATORY_CARE_PROVIDER_SITE_OTHER): Admitting: Behavioral Health

## 2024-01-17 ENCOUNTER — Encounter: Payer: Self-pay | Admitting: Behavioral Health

## 2024-01-17 DIAGNOSIS — F331 Major depressive disorder, recurrent, moderate: Secondary | ICD-10-CM

## 2024-01-17 DIAGNOSIS — F411 Generalized anxiety disorder: Secondary | ICD-10-CM

## 2024-01-17 MED ORDER — VENLAFAXINE HCL ER 150 MG PO CP24: 150.0000 mg | ORAL_CAPSULE | Freq: Every day | ORAL | 1 refills | Status: DC

## 2024-01-17 NOTE — Progress Notes (Signed)
 [facility]  Patient ID: Kendra Ball,  MRN: [id number]  PCP: Catherine Charlies DELENA, DO  Date of Evaluation: 01/17/2024 Time spent:30 minutes  Chief Complaint:  Chief Complaint   Depression; Anxiety; Follow-up; Medication Refill; Patient Education     HISTORY/CURRENT STATUS: HPI Kendra Ball, 78 year old female presents to this office for follow up and medication management.  Collateral information should be considered reliable.  Report doing very well except for some anhedonia. Does not feel it warrants medication adjustment at this time. She believes her medications are still working well.  She does still endorse heavy cigarette smoking approximately 1 pack/day.  Requesting no changes to her medication this visit. Says that her anxiety is 2/10, and depression is 2/10.  She is sleeping 7 to 8 hours per night with the aid of trazodone .   She denies any history of mania, no psychosis, no auditory or visual hallucinations or delirium.  Denies SI or HI.  She is not requesting refills at this time.   Prior psychiatric medication trials: Xanax -patient previously acknowledged abusing the medication so it was stopped 5 years ago.  Recommended that she not utilize any controlled substance. Sertraline  Effexor  Trazodone  Wellbutrin       Individual Medical History/ Review of Systems: Changes? :No   Allergies: Morphine sulfate  Current Medications:  Current Outpatient Medications:    albuterol  (VENTOLIN  HFA) 108 (90 Base) MCG/ACT inhaler, Inhale 1-2 puffs into the lungs every 6 (six) hours as needed for wheezing or shortness of breath., Disp: 8.5 each, Rfl: 11   aspirin  EC 81 MG tablet, Take 1 tablet (81 mg total) by mouth daily. Swallow whole., Disp: 90 tablet, Rfl: 3   budesonide -formoterol  (SYMBICORT ) 160-4.5 MCG/ACT inhaler, Inhale 2 puffs into the lungs in the morning and at bedtime., Disp: 10.2 each, Rfl: 11   buPROPion  (WELLBUTRIN  SR) 150 MG 12 hr tablet, TAKE 1 TABLET BY  MOUTH TWICE A DAY, Disp: 180 tablet, Rfl: 1   busPIRone  (BUSPAR ) 15 MG tablet, TAKE 1 TABLET BY MOUTH THREE TIMES A DAY, Disp: 270 tablet, Rfl: 0   clotrimazole  (LOTRIMIN ) 1 % cream, Apply topically 2 (two) times daily., Disp: , Rfl:    Cyanocobalamin  2500 MCG SUBL, 1 tab sublingual daily, Disp: 90 tablet, Rfl: 3   denosumab  (PROLIA ) 60 MG/ML SOSY injection, Inject 60 mg into the skin every 6 (six) months., Disp: , Rfl:    doxycycline  (VIBRA -TABS) 100 MG tablet, Take 1 tablet (100 mg total) by mouth 2 (two) times daily., Disp: 20 tablet, Rfl: 0   ferrous fumarate  (HEMOCYTE - 106 MG FE) 325 (106 FE) MG TABS, Take 1 tablet by mouth daily. Reported on 10/06/2015, Disp: , Rfl:    levothyroxine  (SYNTHROID ) 100 MCG tablet, TAKE 1 TAB BY MOUTH 6 DAYS A WEEK AND 1/2 TAB ON SUNDAY., Disp: 90 tablet, Rfl: 1   Magnesium 250 MG TABS, Take 250 mg by mouth daily. , Disp: , Rfl:    ranolazine  (RANEXA ) 500 MG 12 hr tablet, TAKE 1 TABLET BY MOUTH TWICE A DAY, Disp: 180 tablet, Rfl: 0   rosuvastatin  (CRESTOR ) 10 MG tablet, Take 1 tablet (10 mg total) by mouth daily., Disp: 90 tablet, Rfl: 2   traZODone  (DESYREL ) 100 MG tablet, TAKE 2 TABLETS BY MOUTH AT BEDTIME., Disp: 180 tablet, Rfl: 0   venlafaxine  XR (EFFEXOR -XR) 150 MG 24 hr capsule, Take 1 capsule (150 mg total) by mouth daily with breakfast., Disp: 90 capsule, Rfl: 1 Medication Side Effects: none  Family Medical/ Social  History: Changes? No  MENTAL HEALTH EXAM:  There were no vitals taken for this visit.There is no height or weight on file to calculate BMI.  General Appearance: Casual, Neat, and Well Groomed  Eye Contact:  Good  Speech:  Clear and Coherent  Volume:  Normal  Mood:  NA  Affect:  Appropriate  Thought Process:  Coherent  Orientation:  Full (Time, Place, and Person)  Thought Content: Logical   Suicidal Thoughts:  No  Homicidal Thoughts:  No  Memory:  WNL  Judgement:  Good  Insight:  Good  Psychomotor Activity:  Normal   Concentration:  Concentration: Good  Recall:  Good  Fund of Knowledge: Good  Language: Good  Assets:  Desire for Improvement  ADL's:  Intact  Cognition: WNL  Prognosis:  Good    DIAGNOSES:    ICD-10-CM   1. MDD (major depressive disorder), recurrent episode, moderate (HCC)  F33.1 venlafaxine  XR (EFFEXOR -XR) 150 MG 24 hr capsule    2. GAD (generalized anxiety disorder)  F41.1 venlafaxine  XR (EFFEXOR -XR) 150 MG 24 hr capsule      Receiving Psychotherapy: No    RECOMMENDATIONS:  Greater than 50% of 30 min face to face time with patient was spent on counseling and coordination of care. We discussed her continued stability  with anxiety and depression. She is doing great overall with no report of depression. We talked about healthy coping mechanisms. Getting more sunlight and going for walk during this favorite time of year for her.     We agreed today to:   Will continue Effexor  150 mg XR daily Will continue buspirone  15 mg 3 times daily Will continue Wellbutrin  150 mg SR daily Will continue trazodone  200 mg at bedtime daily To follow-up in 4 months to reassess  Provided emergency contact information Will report worsening symptoms or side effects promptly Reviewed PDMP          Kendra Ball Pizza, NP

## 2024-01-19 ENCOUNTER — Ambulatory Visit (HOSPITAL_BASED_OUTPATIENT_CLINIC_OR_DEPARTMENT_OTHER)
Admission: RE | Admit: 2024-01-19 | Discharge: 2024-01-19 | Disposition: A | Discharge status: Home / Self Care | Source: Ambulatory Visit | Attending: Acute Care | Admitting: Acute Care

## 2024-01-19 DIAGNOSIS — F17210 Nicotine dependence, cigarettes, uncomplicated: Secondary | ICD-10-CM | POA: Insufficient documentation

## 2024-01-19 DIAGNOSIS — Z122 Encounter for screening for malignant neoplasm of respiratory organs: Secondary | ICD-10-CM | POA: Diagnosis not present

## 2024-01-19 DIAGNOSIS — Z87891 Personal history of nicotine dependence: Secondary | ICD-10-CM | POA: Diagnosis not present

## 2024-01-25 ENCOUNTER — Encounter: Payer: Self-pay | Admitting: Family Medicine

## 2024-01-29 ENCOUNTER — Telehealth: Payer: Self-pay

## 2024-01-29 NOTE — Telephone Encounter (Signed)
 Results reviewed by [place], NP. This was the patients last LCS due to turning 79 in 2026. Please call and review results. Any future scans can be coordinated with her PCP. No sooner scan needed at this time.

## 2024-01-30 NOTE — Telephone Encounter (Signed)
 Called and spoke with pt. Informed her of the results of the LDCT and her ineligibility next year. Patient aware any future orders will need to come from PCP. Results sent to PCP along with information regarding pt aging out. Pt verbalized understanding and denied any further questions or concerns at this time.

## 2024-02-09 ENCOUNTER — Ambulatory Visit: Admitting: Family Medicine

## 2024-02-09 ENCOUNTER — Encounter: Payer: Self-pay | Admitting: Family Medicine

## 2024-02-09 VITALS — BP 122/72 | HR 76 | Temp 98.3°F | Ht 64.0 in | Wt 157.2 lb

## 2024-02-09 DIAGNOSIS — Z0000 Encounter for general adult medical examination without abnormal findings: Secondary | ICD-10-CM | POA: Diagnosis not present

## 2024-02-09 DIAGNOSIS — Z23 Encounter for immunization: Secondary | ICD-10-CM | POA: Diagnosis not present

## 2024-02-09 DIAGNOSIS — F17200 Nicotine dependence, unspecified, uncomplicated: Secondary | ICD-10-CM

## 2024-02-09 DIAGNOSIS — E039 Hypothyroidism, unspecified: Secondary | ICD-10-CM | POA: Diagnosis not present

## 2024-02-09 DIAGNOSIS — E785 Hyperlipidemia, unspecified: Secondary | ICD-10-CM | POA: Diagnosis not present

## 2024-02-09 DIAGNOSIS — Z131 Encounter for screening for diabetes mellitus: Secondary | ICD-10-CM | POA: Diagnosis not present

## 2024-02-09 DIAGNOSIS — D508 Other iron deficiency anemias: Secondary | ICD-10-CM

## 2024-02-09 DIAGNOSIS — F17210 Nicotine dependence, cigarettes, uncomplicated: Secondary | ICD-10-CM

## 2024-02-09 DIAGNOSIS — I700 Atherosclerosis of aorta: Secondary | ICD-10-CM | POA: Diagnosis not present

## 2024-02-09 DIAGNOSIS — Z1231 Encounter for screening mammogram for malignant neoplasm of breast: Secondary | ICD-10-CM

## 2024-02-09 DIAGNOSIS — I739 Peripheral vascular disease, unspecified: Secondary | ICD-10-CM

## 2024-02-09 DIAGNOSIS — M810 Age-related osteoporosis without current pathological fracture: Secondary | ICD-10-CM

## 2024-02-09 DIAGNOSIS — J4489 Other specified chronic obstructive pulmonary disease: Secondary | ICD-10-CM

## 2024-02-09 DIAGNOSIS — E063 Autoimmune thyroiditis: Secondary | ICD-10-CM

## 2024-02-09 DIAGNOSIS — I2510 Atherosclerotic heart disease of native coronary artery without angina pectoris: Secondary | ICD-10-CM

## 2024-02-09 LAB — LIPID PANEL
Cholesterol: 157 mg/dL (ref 0–200)
HDL: 91 mg/dL (ref >39.00)
LDL Cholesterol: 49 mg/dL (ref 0–99)
NonHDL: 66.04
Total CHOL/HDL Ratio: 2
Triglycerides: 83 mg/dL (ref 0.0–149.0)
VLDL: 16.6 mg/dL (ref 0.0–40.0)

## 2024-02-09 LAB — CBC
HCT: 40.9 % (ref 36.0–46.0)
Hemoglobin: 13.5 g/dL (ref 12.0–15.0)
MCHC: 33.1 g/dL (ref 30.0–36.0)
MCV: 105.4 fl — ABNORMAL HIGH (ref 78.0–100.0)
Platelets: 242 K/uL (ref 150.0–400.0)
RBC: 3.88 Mil/uL (ref 3.87–5.11)
RDW: 14 % (ref 11.5–15.5)
WBC: 7 K/uL (ref 4.0–10.5)

## 2024-02-09 LAB — COMPREHENSIVE METABOLIC PANEL WITH GFR
ALT: 26 U/L (ref 0–35)
AST: 26 U/L (ref 0–37)
Albumin: 4.1 g/dL (ref 3.5–5.2)
Alkaline Phosphatase: 37 U/L — ABNORMAL LOW (ref 39–117)
BUN: 9 mg/dL (ref 6–23)
CO2: 32 meq/L (ref 19–32)
Calcium: 8.7 mg/dL (ref 8.4–10.5)
Chloride: 103 meq/L (ref 96–112)
Creatinine, Ser: 0.77 mg/dL (ref 0.40–1.20)
GFR: 74.26 mL/min (ref >60.00)
Glucose, Bld: 77 mg/dL (ref 70–99)
Potassium: 3.9 meq/L (ref 3.5–5.1)
Sodium: 143 meq/L (ref 135–145)
Total Bilirubin: 0.7 mg/dL (ref 0.2–1.2)
Total Protein: 6.2 g/dL (ref 6.0–8.3)

## 2024-02-09 LAB — HEMOGLOBIN A1C: Hgb A1c MFr Bld: 5.5 % (ref 4.6–6.5)

## 2024-02-09 LAB — TSH: TSH: 54.33 u[IU]/mL — ABNORMAL HIGH (ref 0.35–5.50)

## 2024-02-09 LAB — VITAMIN D 25 HYDROXY (VIT D DEFICIENCY, FRACTURES): VITD: 42.99 ng/mL (ref 30.00–100.00)

## 2024-02-09 MED ORDER — DENOSUMAB 60 MG/ML ~~LOC~~ SOSY
60.0000 mg | PREFILLED_SYRINGE | Freq: Once | SUBCUTANEOUS | Status: AC
  Administered 2024-02-09: 60 mg via SUBCUTANEOUS

## 2024-02-09 NOTE — Patient Instructions (Addendum)

## 2024-02-09 NOTE — Progress Notes (Signed)
 Kendra Ball , 07-27-1945, 78 y.o., female MRN: [id number] Patient Care Team    Relationship Specialty Notifications Start End  Kendra Charlies DELENA, DO PCP - General Family Medicine  12/30/14   Kendra Lamprey, MD Consulting Physician Gastroenterology  05/09/14   Kendra Arley NOVAK, MD Consulting Physician Oncology  04/23/20   Kendra Lamar PARAS, MD Consulting Physician Cardiology  05/12/22   Kendra Redell DELENA, NP Nurse Practitioner Behavioral Health  02/09/24   Kendra Tawni CROME, MD Referring Physician Dermatology  02/09/24     Chief Complaint  Patient presents with   Annual Exam    Pt is fasting.  Chronic Conditions/illness Management      Subjective: Kendra Ball is a 78 y.o.  patient presents for annual physical and combined chronic condition appointment. Medication reconciliation completed today. Past medical history updated where appropriate.   Health maintenance:  Mammogram: Completed 01/05/2024-[facility]--return for 1 year screening mammograms: Immunizations: Tdap-discontinued per patient, influenza vaccine UTD 2025, Prevnar completed, Shingrix declined Infectious disease screening: Completed DEXA: Completed 11/16/2021. DWG> T-score -4.4 arm, -2.8 right femur  Hypothyroidism:  Pt reports compliance with levo 100 mcg daily.  No complaints   05/29/2023 echo:  1. Technically difficult study.   2. Rhythm strip during this exam demonstrates premature ventricular  contractions.   3. Left ventricular ejection fraction, by estimation, is 55 to 60%. The  left ventricle has normal function. The left ventricle has no regional  wall motion abnormalities. Left ventricular diastolic parameters are  indeterminate.   4. Right ventricular systolic function is normal. The right ventricular  size is normal.   5. The mitral valve is degenerative. Mild mitral valve regurgitation. No  evidence of mitral stenosis.   6. The aortic valve was not well visualized. Aortic valve  regurgitation  is not visualized. No aortic stenosis is present.   7. The inferior vena cava is normal in size with greater than 50%  respiratory variability, suggesting right atrial pressure of 3 mmHg   Carotid duplex 01/17/2023 Right Carotid: Velocities in the right ICA are consistent with a 60-79%                 stenosis.   Left Carotid: Velocities in the left ICA are consistent with a 40-59%  stenosis.  Vertebrals: Bilateral vertebral arteries demonstrate antegrade flow.  Subclavians: Normal flow hemodynamics were seen in bilateral subclavian               arteries.   05/20/2020 lexiscan : Nuclear stress EF: 51%. There was no ST segment deviation noted during stress. No T wave inversion was noted during stress. There is a small, partially reversible perfusion defect of moderate severity present in the apical anterior location. Findings consistent with prior myocardial infarction with peri-infarct ischemia. This is a low risk study. The left ventricular ejection fraction is mildly decreased (45-54%).   01/10/2022 carotid studies: Right Carotid: Velocities in the right ICA are consistent with a 60-79%                 stenosis.  Left Carotid: Velocities in the left ICA are consistent with a 40-59%  stenosis.     12/20/2023    2:40 PM 12/14/2022    2:21 PM 11/16/2022   10:47 AM 08/01/2022    1:52 PM 06/09/2022   10:18 AM  Depression screen PHQ 2/9  Decreased Interest 0 0 0 0   Down, Depressed, Hopeless 0 0 0 0   PHQ -  2 Score 0 0 0 0   Altered sleeping 0 0 0    Tired, decreased energy 0 0 2    Change in appetite 0 0 0    Feeling bad or failure about yourself  0 0 0    Trouble concentrating 0 0 0    Moving slowly or fidgety/restless 0 0 0    Suicidal thoughts 0 0 0    PHQ-9 Score 0 0 2    Difficult doing work/chores Not difficult at all         Information is confidential and restricted. Go to Review Flowsheets to unlock data.    Allergies  Allergen Reactions   Morphine  Sulfate Swelling and Other (See Comments)    Swelling around injection area   Social History   Social History Narrative   Kendra Ball lives in [place] with her husband. She has 3 grown children and several grand children. She has joint custody of 3 of her grand children as her daughter has bipolar disorder and has needed assistance with children in past.   1-2 cups coffee, 3-6 cups tea per day.   Right-handed.   Past Medical History:  Diagnosis Date   Abnormal mammogram 10/01/2013   Diagnostic MMG & US  confirm cyst & duct ectasia of L breast. 08/2013.    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 RF: smoker, fam Hx, sedentary HDL is high!!    Dyspnea on exertion 05/08/2020   Essential hypertension 05/22/2014   Family history of malignant neoplasm of gastrointestinal tract 05/05/2020   Family history of malignant neoplasm of gastrointestinal tract 05/05/2020   Family history of premature coronary heart disease 08/13/2013   Father, brother, sister Personal risk factors: smoker, female over 54, fam Hx, sedentary, thyroid  disease, dyslipidemia, BMI over 25.    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     Insomnia 05/05/2015   Iron deficiency anemia 05/05/2020   Lymphoma (HCC) 2019   B-cell   MDD (major depressive disorder), recurrent episode, moderate (HCC) 01/30/2014   [facility] 05/2015 08/18/2015:  Effexor  XR 225mg  po qD for depression and anxiety Wellbutrin  SR to 200mg  po BID for depression   Xanax  0.5mg  po BID and 1mg  po qHS prn anxiety       Routine PRN Medications:  Yes    Consultations: encouraged to f/up with PCP as needed, encouraged to start individual therapy   Safety Concerns:  Pt denies SI and is at an acute low risk for suicide.Patient told to call clinic i  Multiple fractures 01/29/2019   Obesity    Osteoporosis    Paresthesia 08/06/2019   Port-A-Cath in place 12/27/2017   Pulmonary nodule seen on imaging study 02/13/2015   Smoker 09/14/2015   Substance abuse [facility])    inpatient and outpatient tx for substance abuse   TOBACCO USER 12/25/2009   Tremor 05/23/2019   Past Surgical History:  Procedure Laterality Date   AXILLARY LYMPH NODE BIOPSY Right 06/22/2017   Procedure: RIGHT AXILLARY LYMPH NODE BIOPSY;  Surgeon: Belinda Cough, MD;  Location: [facility];  Service: General;  Laterality: Right;   BARIATRIC SURGERY  2006   CESAREAN SECTION     COLON RESECTION N/A 05/09/2014   Procedure: LAPAROSCOPIC HAND-ASSISTED EXTENDED RIGHT COLECTOMY, LAPAROSCOPIC LYSIS OF ADHESIONS, SPLENIC FLEXURE MOBILIZATION.;  Surgeon: Elspeth Schultze, MD;  Location: [facility];  Service: General;  Laterality: N/A;   DILATION AND CURETTAGE OF UTERUS     x2   FOREARM FRACTURE SURGERY     right   GASTRIC BYPASS  2006   IR IMAGING GUIDED PORT INSERTION  12/01/2017   IR REMOVAL TUN ACCESS W/ PORT W/O FL MOD SED  07/03/2018   MASS EXCISION Right 02/24/2017   Procedure: EXCISIONAL BIOPSY RIGHT SUPRA-CLAVICULAR NODE;  Surgeon: Arlana Arnt, MD;  Location: [facility];  Service: ENT;  Laterality: Right;    TONSILLECTOMY     Family History  Problem Relation Age of Onset   Cancer Mother        colon   Depression Mother    Alzheimer's disease Mother        Dementia   Heart disease Father 63   Heart disease Sister 40       stents, pacemaker   Depression Sister    Heart disease Brother 62       CABG, MI   Depression Brother    Obesity Daughter    Suicidality Daughter    Bipolar disorder Daughter    Obesity Son    Mental illness Daughter        bipolar   Obesity Daughter    Depression Sister    Dementia Paternal Aunt    Alcohol  abuse Maternal Uncle    Colon cancer Maternal Aunt    Stomach cancer Maternal Grandfather    Allergies as of 02/09/2024       Reactions   Morphine Sulfate Swelling, Other (See Comments)   Swelling around injection area        Medication List        Accurate as of February 09, 2024 11:04 AM. If you have any questions, ask your nurse or doctor.          STOP taking these medications    doxycycline  100 MG tablet Commonly known as: VIBRA -TABS Stopped by: Charlies Bellini   mNexspike 10 MCG/0.2ML Susy Generic drug: COVID-19 mRNA Vacc (Moderna) Stopped by: Charlies Bellini       TAKE these medications    albuterol  108 (90 Base) MCG/ACT inhaler Commonly known as: VENTOLIN  HFA Inhale 1-2 puffs into the lungs every 6 (six) hours as needed for wheezing or shortness of breath.   aspirin  EC 81 MG tablet Take 1 tablet (81 mg total) by mouth daily. Swallow whole.   budesonide -formoterol  160-4.5 MCG/ACT inhaler Commonly known as: Symbicort  Inhale 2 puffs into the lungs in the morning and at bedtime.   buPROPion  150 MG 12 hr tablet Commonly known as: WELLBUTRIN  SR TAKE 1 TABLET BY MOUTH TWICE A DAY   busPIRone  15  MG tablet Commonly known as: BUSPAR  TAKE 1 TABLET BY MOUTH THREE TIMES A DAY   clotrimazole  1 % cream Commonly known as: LOTRIMIN  Apply topically 2 (two) times daily.   Cyanocobalamin  2500 MCG Subl 1 tab sublingual daily    denosumab  60 MG/ML Sosy injection Commonly known as: PROLIA  Inject 60 mg into the skin every 6 (six) months.   ferrous fumarate  325 (106 Fe) MG Tabs tablet Commonly known as: HEMOCYTE - 106 mg FE Take 1 tablet by mouth daily. Reported on 10/06/2015   levothyroxine  100 MCG tablet Commonly known as: SYNTHROID  TAKE 1 TAB BY MOUTH 6 DAYS A WEEK AND 1/2 TAB ON SUNDAY.   Magnesium 250 MG Tabs Take 250 mg by mouth daily.   ranolazine  500 MG 12 hr tablet Commonly known as: RANEXA  TAKE 1 TABLET BY MOUTH TWICE A DAY   rosuvastatin  10 MG tablet Commonly known as: CRESTOR  Take 1 tablet (10 mg total) by mouth daily.   traZODone  100 MG tablet Commonly known as: DESYREL  TAKE 2 TABLETS BY MOUTH AT BEDTIME.   venlafaxine  XR 150 MG 24 hr capsule Commonly known as: EFFEXOR -XR Take 1 capsule (150 mg total) by mouth daily with breakfast.        All past medical history, surgical history, allergies, family history, immunizations andmedications were updated in the EMR today and reviewed under the history and medication portions of their EMR.     Review of Systems  All other systems reviewed and are negative.  Negative, with the exception of above mentioned in HPI   Objective:  BP 122/72   Pulse 76   Temp 98.3 F (36.8 C)   Ht 5' 4 (1.626 m)   Wt 157 lb 3.2 oz (71.3 kg)   SpO2 96%   BMI 26.98 kg/m  Body mass index is 26.98 kg/m. Physical Exam Vitals and nursing note reviewed.  Constitutional:      General: She is not in acute distress.    Appearance: Normal appearance. She is not ill-appearing, toxic-appearing or diaphoretic.  HENT:     Head: Normocephalic and atraumatic.     Right Ear: Tympanic membrane, ear canal and external ear normal. There is no impacted cerumen.     Left Ear: Tympanic membrane, ear canal and external ear normal. There is no impacted cerumen.     Nose: No congestion or rhinorrhea.     Mouth/Throat:     Mouth: Mucous membranes are moist.     Pharynx:  Oropharynx is clear. No oropharyngeal exudate or posterior oropharyngeal erythema.  Eyes:     General: No scleral icterus.       Right eye: No discharge.        Left eye: No discharge.     Extraocular Movements: Extraocular movements intact.     Conjunctiva/sclera: Conjunctivae normal.     Pupils: Pupils are equal, round, and reactive to light.  Cardiovascular:     Rate and Rhythm: Normal rate and regular rhythm.     Pulses: Normal pulses.     Heart sounds: Normal heart sounds. No murmur heard.    No friction rub. No gallop.  Pulmonary:     Effort: Pulmonary effort is normal. No respiratory distress.     Breath sounds: Normal breath sounds. No stridor. No wheezing, rhonchi or rales.  Chest:     Chest wall: No tenderness.  Abdominal:     General: Abdomen is flat. Bowel sounds are normal. There is no distension.     Palpations:  Abdomen is soft. There is no mass.     Tenderness: There is no abdominal tenderness. There is no right CVA tenderness, left CVA tenderness, guarding or rebound.     Hernia: No hernia is present.  Musculoskeletal:        General: No swelling, tenderness or deformity. Normal range of motion.     Cervical back: Normal range of motion and neck supple. No rigidity or tenderness.     Right lower leg: No edema.     Left lower leg: No edema.  Lymphadenopathy:     Cervical: No cervical adenopathy.  Skin:    General: Skin is warm and dry.     Coloration: Skin is not jaundiced or pale.     Findings: No bruising, erythema, lesion or rash.  Neurological:     General: No focal deficit present.     Mental Status: She is alert and oriented to person, place, and time. Mental status is at baseline.     Cranial Nerves: No cranial nerve deficit.     Sensory: No sensory deficit.     Motor: No weakness.     Coordination: Coordination normal.     Gait: Gait normal.     Deep Tendon Reflexes: Reflexes normal.  Psychiatric:        Mood and Affect: Mood normal.         Behavior: Behavior normal.        Thought Content: Thought content normal.        Judgment: Judgment normal.    No results found. No results found. No results found for this or any previous visit (from the past 24 hours).  Assessment/Plan: Kendra Ball is a 78 y.o. female present for OV for cpe and Chronic Conditions/illness Management Hypothyroidism  Tsh collected today Continue levo 100 mcg every day. refills will be provided in appropriate dose based on lab result today  Dyslipidemia Lipids collected today  Age-related osteoporosis without current pathological fracture Completed 10/2021, rpt 2 yrs T score- -4.4 Supplementing vit d Prolia  inj provided today-- denosumab  (PROLIA ) injection 60 mg> continue q 6mos  Breast cancer screening by mammogram - MM 3D SCREENING MAMMOGRAM BILATERAL BREAST; Future Diabetes mellitus screening - Hemoglobin A1c   Atherosclerosis of native coronary artery of native heart without angina pectoris/Aortic atherosclerosis/Peripheral vascular disease, unspecified (HCC) bilateral carotic arterial disease Continue ASA 81 mg daily Continue Ranexa  Continue cardiology follow-ups  COPD (chronic obstructive pulmonary disease) with chronic bronchitis (HCC) Stable Continue albuterol  as needed Continue Symbicort  2 puffs twice daily  Routine general medical examination at a health care facility (Primary) Patient was encouraged to exercise greater than 150 minutes a week. Patient was encouraged to choose a diet filled with fresh fruits and vegetables, and lean meats. AVS provided to patient today for education/recommendation on gender specific health and safety maintenance. Mammogram: Completed 01/05/2024-[facility]--return for 1 year screening mammograms: Immunizations: Tdap-discontinued per patient, influenza vaccine UTD 2025, Prevnar completed, Shingrix declined Infectious disease screening: Completed DEXA: Completed 11/16/2021. DWG> T-score -4.4 arm,  -2.8 right femur  Reviewed expectations re: course of current medical issues. Discussed self-management of symptoms. Outlined signs and symptoms indicating need for more acute intervention. Patient verbalized understanding and all questions were answered. Patient received an After-Visit Summary.  Return in about 1 year (around 02/09/2025) for cpe (20 min), Routine chronic condition follow-up.   Orders Placed This Encounter  Procedures   MM 3D SCREENING MAMMOGRAM BILATERAL BREAST   CBC   Comprehensive metabolic panel with GFR  Hemoglobin A1c   Lipid panel   TSH   Vitamin D  (25 hydroxy)   Meds ordered this encounter  Medications   denosumab  (PROLIA ) injection 60 mg    Patient is enrolled in [facility] program for this medication and I have provided a copy of the Prolia  Medication Guide and Patient Brochure.:   Yes    I have reviewed with the patient the information in the Prolia  Medication Guide and Patient Counseling Chart including the serious risks of Prolia  and symptoms of each risk.:   Yes    I have advised the patient to seek medical attention if they have signs or symptoms of any of the serious risks.:   Yes   Referral Orders  No referral(s) requested today     Note is dictated utilizing voice recognition software. Although note has been proof read prior to signing, occasional typographical errors still can be missed. If any questions arise, please do not hesitate to call for verification.   electronically signed by:  Charlies Bellini, DO  [facility] - [facility]

## 2024-02-12 ENCOUNTER — Ambulatory Visit: Payer: Self-pay | Admitting: Family Medicine

## 2024-02-12 DIAGNOSIS — E063 Autoimmune thyroiditis: Secondary | ICD-10-CM

## 2024-02-12 NOTE — Telephone Encounter (Signed)
 Please call patient Liver and kidney functions are normal Blood cell counts are stable and electrolytes are normal Diabetes screening/A1c is normal at 5.5 Cholesterol panel looks great and is at goal Vitamin D  is normal Thyroid  is severely under replaced  Please have her confirm she is taking levothyroxine  100 mcg daily and a half a tab on Sunday.   If she confirms she is taking this dose daily, I would like her to come in for a repeat lab appointment only ASAP-to confirm the abnormality before we make changes to her medicine, since this is such a huge difference compared to her normal.  Also, please make sure she is not taking magnesium within an 8-hour window of taking her thyroid  medication.  If she has not been taking the medication, then we would restart at the same dose mentioned above and bring her back in 8 weeks with provider appointment to retest.

## 2024-02-14 ENCOUNTER — Other Ambulatory Visit (INDEPENDENT_AMBULATORY_CARE_PROVIDER_SITE_OTHER)

## 2024-02-14 DIAGNOSIS — E063 Autoimmune thyroiditis: Secondary | ICD-10-CM | POA: Diagnosis not present

## 2024-02-14 LAB — T4, FREE: Free T4: 0.67 ng/dL (ref 0.60–1.60)

## 2024-02-14 LAB — TSH: TSH: 47.09 u[IU]/mL — ABNORMAL HIGH (ref 0.35–5.50)

## 2024-02-15 ENCOUNTER — Ambulatory Visit: Payer: Self-pay | Admitting: Family Medicine

## 2024-02-15 MED ORDER — LEVOTHYROXINE SODIUM 112 MCG PO TABS: ORAL_TABLET | ORAL | 0 refills | Status: AC

## 2024-02-15 NOTE — Telephone Encounter (Signed)
 I have called in the increased dose of levothyroxine  of 112 mcg daily on an empty stomach.  I recommend she take this medication by itself without any other medications. Recommend she take her magnesium supplement at least 8 hours before/after any other medications.  Follow-up in 7-8 weeks with provider

## 2024-02-15 NOTE — Telephone Encounter (Signed)
 Her repeat thyroid  lab confirms under supplemented thyroid .  Has she been taking her levothyroxine  100 mcg daily and half a tab on Sunday on an empty stomach?  Is she taking any of her other over-the-counter supplements or medications at the same time as her levothyroxine ?   We if she taking the magnesium supplement?  Please ask patient the above questions questions.  Once I get those answers we will make adjustments to her thyroid  medication and call her back with recommendations and follow-up

## 2024-02-15 NOTE — Addendum Note (Signed)
 Addended by: Azarel Banner A on: 02/15/2024 02:28 PM   Modules accepted: Orders

## 2024-02-19 DIAGNOSIS — H5203 Hypermetropia, bilateral: Secondary | ICD-10-CM | POA: Diagnosis not present

## 2024-02-19 DIAGNOSIS — H52223 Regular astigmatism, bilateral: Secondary | ICD-10-CM | POA: Diagnosis not present

## 2024-02-19 DIAGNOSIS — H524 Presbyopia: Secondary | ICD-10-CM | POA: Diagnosis not present

## 2024-03-07 ENCOUNTER — Other Ambulatory Visit: Payer: Self-pay | Admitting: Behavioral Health

## 2024-03-07 DIAGNOSIS — F331 Major depressive disorder, recurrent, moderate: Secondary | ICD-10-CM

## 2024-03-07 DIAGNOSIS — F1020 Alcohol dependence, uncomplicated: Secondary | ICD-10-CM

## 2024-03-07 DIAGNOSIS — F411 Generalized anxiety disorder: Secondary | ICD-10-CM

## 2024-03-07 DIAGNOSIS — F17200 Nicotine dependence, unspecified, uncomplicated: Secondary | ICD-10-CM

## 2024-03-20 ENCOUNTER — Other Ambulatory Visit: Payer: Self-pay | Admitting: Behavioral Health

## 2024-03-20 DIAGNOSIS — F99 Mental disorder, not otherwise specified: Secondary | ICD-10-CM

## 2024-03-20 DIAGNOSIS — F331 Major depressive disorder, recurrent, moderate: Secondary | ICD-10-CM

## 2024-03-20 DIAGNOSIS — F1020 Alcohol dependence, uncomplicated: Secondary | ICD-10-CM

## 2024-03-25 DIAGNOSIS — H25013 Cortical age-related cataract, bilateral: Secondary | ICD-10-CM | POA: Diagnosis not present

## 2024-03-25 DIAGNOSIS — H2513 Age-related nuclear cataract, bilateral: Secondary | ICD-10-CM | POA: Diagnosis not present

## 2024-03-25 DIAGNOSIS — H25043 Posterior subcapsular polar age-related cataract, bilateral: Secondary | ICD-10-CM | POA: Diagnosis not present

## 2024-03-25 DIAGNOSIS — H2511 Age-related nuclear cataract, right eye: Secondary | ICD-10-CM | POA: Diagnosis not present

## 2024-03-25 DIAGNOSIS — H353131 Nonexudative age-related macular degeneration, bilateral, early dry stage: Secondary | ICD-10-CM | POA: Diagnosis not present

## 2024-03-27 ENCOUNTER — Ambulatory Visit (HOSPITAL_BASED_OUTPATIENT_CLINIC_OR_DEPARTMENT_OTHER)
Admission: RE | Admit: 2024-03-27 | Discharge: 2024-03-27 | Disposition: A | Discharge status: Home / Self Care | Source: Ambulatory Visit | Attending: Family Medicine | Admitting: Family Medicine

## 2024-03-27 DIAGNOSIS — Z780 Asymptomatic menopausal state: Secondary | ICD-10-CM | POA: Insufficient documentation

## 2024-03-27 DIAGNOSIS — M810 Age-related osteoporosis without current pathological fracture: Secondary | ICD-10-CM | POA: Diagnosis not present

## 2024-03-28 ENCOUNTER — Ambulatory Visit: Payer: Self-pay | Admitting: Family Medicine

## 2024-03-31 ENCOUNTER — Other Ambulatory Visit: Payer: Self-pay | Admitting: Student

## 2024-04-23 ENCOUNTER — Encounter: Payer: Self-pay | Admitting: Pharmacist

## 2024-04-23 NOTE — Progress Notes (Signed)
 Pharmacy Quality Measure Review  This patient is appearing on a report for being at risk of failing the adherence measure for cholesterol (statin) medications this calendar year.   Medication: rosuvastatin   Last fill date: 01/05/2024 for 90 day supply per adherence report.   Reviewed refill history and patient actually filled rosuvastatin  again for 90 DS on 04/05/2024  Insurance report was not up to date. No action needed at this time.  Patient has no refills remaining on current rosuvastatin  prescription. Usually prescribed by her cardiology office. It looks like she is past due to see cardiologist.  Called patient today to remind her about follow up with cardiologist. Patient states she will call them to make an appointment. She was thankful for the reminder.   Madelin Ray, PharmD Clinical Pharmacist [facility]  Population Health [phone]

## 2024-04-29 ENCOUNTER — Encounter: Payer: Self-pay | Admitting: Behavioral Health

## 2024-04-29 ENCOUNTER — Ambulatory Visit (INDEPENDENT_AMBULATORY_CARE_PROVIDER_SITE_OTHER): Admitting: Behavioral Health

## 2024-04-29 DIAGNOSIS — F5105 Insomnia due to other mental disorder: Secondary | ICD-10-CM | POA: Diagnosis not present

## 2024-04-29 DIAGNOSIS — F1020 Alcohol dependence, uncomplicated: Secondary | ICD-10-CM | POA: Diagnosis not present

## 2024-04-29 DIAGNOSIS — F17200 Nicotine dependence, unspecified, uncomplicated: Secondary | ICD-10-CM | POA: Diagnosis not present

## 2024-04-29 DIAGNOSIS — F411 Generalized anxiety disorder: Secondary | ICD-10-CM | POA: Diagnosis not present

## 2024-04-29 DIAGNOSIS — F99 Mental disorder, not otherwise specified: Secondary | ICD-10-CM

## 2024-04-29 DIAGNOSIS — F331 Major depressive disorder, recurrent, moderate: Secondary | ICD-10-CM

## 2024-04-29 MED ORDER — BUPROPION HCL ER (SR) 150 MG PO TB12: 150.0000 mg | ORAL_TABLET | Freq: Two times a day (BID) | ORAL | 0 refills | Status: AC

## 2024-04-29 MED ORDER — TRAZODONE HCL 100 MG PO TABS: 200.0000 mg | ORAL_TABLET | Freq: Every day | ORAL | 0 refills | Status: AC

## 2024-04-29 MED ORDER — BUSPIRONE HCL 15 MG PO TABS: 15.0000 mg | ORAL_TABLET | Freq: Three times a day (TID) | ORAL | 0 refills | Status: AC

## 2024-04-29 MED ORDER — VENLAFAXINE HCL ER 150 MG PO CP24: 150.0000 mg | ORAL_CAPSULE | Freq: Every day | ORAL | 1 refills | Status: AC

## 2024-04-29 NOTE — Progress Notes (Signed)
 "     [facility]  Patient ID: Kendra Ball,  MRN: [id number]  PCP: Catherine Charlies DELENA, DO  Date of Evaluation: 04/29/2024 Time spent:20 minutes  Chief Complaint:  Chief Complaint   Depression; Anxiety; Follow-up; Medication Refill; Patient Education     HISTORY/CURRENT STATUS: HPI Kendra Ball, 79 year old female presents to this office for follow up and medication management.  Collateral information should be considered reliable.  Says that she is doing pretty good right now.  Does not want to adjust or change her medications this visit.  She believes her medications are still working well.  She does still endorse heavy cigarette smoking approximately 1 pack/day.  Requesting no changes to her medication this visit. Says that her anxiety is 2/10, and depression is 2/10.  She is sleeping 7 to 8 hours per night with the aid of trazodone .   She denies any history of mania, no psychosis, no auditory or visual hallucinations or delirium.  Denies SI or HI.  She is not requesting refills at this time.   Prior psychiatric medication trials: Xanax -patient previously acknowledged abusing the medication so it was stopped 5 years ago.  Recommended that she not utilize any controlled substance. Sertraline  Effexor  Trazodone  Wellbutrin          Individual Medical History/ Review of Systems: Changes? :No   Allergies: Morphine sulfate  Current Medications: Current Medications[1] Medication Side Effects: none  Family Medical/ Social History: Changes? No  MENTAL HEALTH EXAM:  There were no vitals taken for this visit.There is no height or weight on file to calculate BMI.  General Appearance: Casual, Neat, and Well Groomed  Eye Contact:  Good  Speech:  Clear and Coherent  Volume:  Normal  Mood:  NA  Affect:  Appropriate  Thought Process:  Coherent  Orientation:  NA  Thought Content: Logical   Suicidal Thoughts:  No  Homicidal Thoughts:  No  Memory:  WNL  Judgement:  Good   Insight:  Good  Psychomotor Activity:  Normal  Concentration:  Concentration: Good  Recall:  Good  Fund of Knowledge: Good  Language: Good  Assets:  Desire for Improvement  ADL's:  Intact  Cognition: WNL  Prognosis:  Good    DIAGNOSES:    ICD-10-CM   1. MDD (major depressive disorder), recurrent episode, moderate (HCC)  F33.1 venlafaxine  XR (EFFEXOR -XR) 150 MG 24 hr capsule    buPROPion  (WELLBUTRIN  SR) 150 MG 12 hr tablet    traZODone  (DESYREL ) 100 MG tablet    busPIRone  (BUSPAR ) 15 MG tablet    2. GAD (generalized anxiety disorder)  F41.1 venlafaxine  XR (EFFEXOR -XR) 150 MG 24 hr capsule    busPIRone  (BUSPAR ) 15 MG tablet    3. Nicotine  use disorder  F17.200 buPROPion  (WELLBUTRIN  SR) 150 MG 12 hr tablet    4. Alcohol  use disorder, moderate, dependence (HCC)  F10.20 traZODone  (DESYREL ) 100 MG tablet    busPIRone  (BUSPAR ) 15 MG tablet    5. Insomnia due to other mental disorder  F51.05 traZODone  (DESYREL ) 100 MG tablet   F99       Receiving Psychotherapy: No    RECOMMENDATIONS:   Greater than 50% of 30 min face to face time with patient was spent on counseling and coordination of care. We discussed her continued stability  with anxiety and depression.  Reports having a good holiday.  She is doing great overall with no report of depression. We talked about healthy coping mechanisms. Getting more sunlight and going for walk  during this favorite time of year for her.     We agreed today to:   Will continue Effexor  150 mg XR daily Will continue buspirone  15 mg 3 times daily Will continue Wellbutrin  150 mg SR daily Will continue trazodone  200 mg at bedtime daily To follow-up in 4 months to reassess  Provided emergency contact information Will report worsening symptoms or side effects promptly Reviewed PDMP      Redell DELENA Pizza, NP     [1]  Current Outpatient Medications:    albuterol  (VENTOLIN  HFA) 108 (90 Base) MCG/ACT inhaler, Inhale 1-2 puffs into the lungs  every 6 (six) hours as needed for wheezing or shortness of breath., Disp: 8.5 each, Rfl: 11   aspirin  EC 81 MG tablet, Take 1 tablet (81 mg total) by mouth daily. Swallow whole., Disp: 90 tablet, Rfl: 3   budesonide -formoterol  (SYMBICORT ) 160-4.5 MCG/ACT inhaler, Inhale 2 puffs into the lungs in the morning and at bedtime., Disp: 10.2 each, Rfl: 11   buPROPion  (WELLBUTRIN  SR) 150 MG 12 hr tablet, Take 1 tablet (150 mg total) by mouth 2 (two) times daily., Disp: 180 tablet, Rfl: 0   busPIRone  (BUSPAR ) 15 MG tablet, Take 1 tablet (15 mg total) by mouth 3 (three) times daily., Disp: 270 tablet, Rfl: 0   clotrimazole  (LOTRIMIN ) 1 % cream, Apply topically 2 (two) times daily., Disp: , Rfl:    Cyanocobalamin  2500 MCG SUBL, 1 tab sublingual daily, Disp: 90 tablet, Rfl: 3   denosumab  (PROLIA ) 60 MG/ML SOSY injection, Inject 60 mg into the skin every 6 (six) months., Disp: , Rfl:    ferrous fumarate  (HEMOCYTE - 106 MG FE) 325 (106 FE) MG TABS, Take 1 tablet by mouth daily. Reported on 10/06/2015, Disp: , Rfl:    levothyroxine  (SYNTHROID ) 112 MCG tablet, TAKE 1 TAB BY MOUTH 6 DAYS A WEEK AND 1/2 TAB ON SUNDAY., Disp: 90 tablet, Rfl: 0   Magnesium 250 MG TABS, Take 250 mg by mouth daily. , Disp: , Rfl:    ranolazine  (RANEXA ) 500 MG 12 hr tablet, TAKE 1 TABLET BY MOUTH TWICE A DAY, Disp: 60 tablet, Rfl: 0   rosuvastatin  (CRESTOR ) 10 MG tablet, Take 1 tablet (10 mg total) by mouth daily., Disp: 90 tablet, Rfl: 2   traZODone  (DESYREL ) 100 MG tablet, Take 2 tablets (200 mg total) by mouth at bedtime., Disp: 180 tablet, Rfl: 0   venlafaxine  XR (EFFEXOR -XR) 150 MG 24 hr capsule, Take 1 capsule (150 mg total) by mouth daily with breakfast., Disp: 90 capsule, Rfl: 1  "

## 2024-05-01 ENCOUNTER — Other Ambulatory Visit: Payer: Self-pay | Admitting: Cardiovascular Disease

## 2024-05-02 ENCOUNTER — Other Ambulatory Visit: Payer: Self-pay | Admitting: Family Medicine

## 2024-05-12 ENCOUNTER — Other Ambulatory Visit: Payer: Self-pay | Admitting: Family Medicine

## 2024-05-14 ENCOUNTER — Telehealth: Payer: Self-pay

## 2024-05-14 NOTE — Telephone Encounter (Signed)
 Communication  Reason for CRM: patient called to schedule appointment for redo in labs for thyroids but no orders were put in yet by the doctor. Please contact her to schedule once Dr Kuneff has put in orders.      Phone number: [phone]   Pt scheduled.

## 2024-05-15 ENCOUNTER — Other Ambulatory Visit: Payer: Self-pay | Admitting: Cardiovascular Disease

## 2024-05-15 ENCOUNTER — Telehealth (HOSPITAL_BASED_OUTPATIENT_CLINIC_OR_DEPARTMENT_OTHER): Payer: Self-pay | Admitting: Student

## 2024-05-15 NOTE — Telephone Encounter (Signed)
 Patient has been scheduled with Callie Goodrich in April. She has stated that she only has medication for 2 weeks. Please refill meds so that she enough to last until her appt. Call patient if need be.

## 2024-05-16 ENCOUNTER — Other Ambulatory Visit (HOSPITAL_BASED_OUTPATIENT_CLINIC_OR_DEPARTMENT_OTHER): Payer: Self-pay

## 2024-05-16 ENCOUNTER — Other Ambulatory Visit: Payer: Self-pay

## 2024-05-16 ENCOUNTER — Telehealth: Payer: Self-pay | Admitting: *Deleted

## 2024-05-16 MED ORDER — ASPIRIN EC 81 MG PO TBEC
81.0000 mg | DELAYED_RELEASE_TABLET | Freq: Every day | ORAL | 3 refills | Status: AC
  Filled 2024-05-16: qty 90, 90d supply, fill #0

## 2024-05-16 MED ORDER — RANOLAZINE ER 500 MG PO TB12: 500.0000 mg | ORAL_TABLET | Freq: Two times a day (BID) | ORAL | 0 refills | Status: DC

## 2024-05-16 MED ORDER — RANOLAZINE ER 500 MG PO TB12
500.0000 mg | ORAL_TABLET | Freq: Two times a day (BID) | ORAL | 3 refills | Status: AC
  Filled 2024-05-16: qty 180, 90d supply, fill #0

## 2024-05-16 MED ORDER — ROSUVASTATIN CALCIUM 10 MG PO TABS
10.0000 mg | ORAL_TABLET | Freq: Every day | ORAL | 3 refills | Status: AC
  Filled 2024-05-16: qty 90, 90d supply, fill #0

## 2024-05-16 NOTE — Telephone Encounter (Signed)
 Call placed to pt.  Moved her appt up to February 23.  Pt has enough Rosuvastatin  and I have sent in another refill for Ranexa  to [facility].  Pt was thankful for the call back.

## 2024-05-16 NOTE — Telephone Encounter (Signed)
-----   Message from Callie E Goodrich sent at 05/16/2024  7:49 AM EST ----- Regarding: RE: refills needed Morning! It's okay to provide her refills of her cardiac medications (Aspirin , Crestor , Ranexa ) to get her to her appointment. But do we not have anything sooner than April? She hasn't been seen since 01/2023.   Thank you! Callie ----- Message ----- From: Dyana Palma Sent: 05/14/2024   2:48 PM EST To: Callie E Goodrich, PA-C; [facility] Re# Subject: refills needed                                 Good afternoon and sorry to bother you. I have just scheduled this patient for a follow up in April with you. She advised that she only has 2 weeks left of her prescriptions. Are you able to refill enough to last until appointment in April?  Thank you so much!

## 2024-05-21 ENCOUNTER — Ambulatory Visit: Admitting: Family Medicine

## 2024-05-21 NOTE — Telephone Encounter (Signed)
 Refills have already been sent.

## 2024-05-27 ENCOUNTER — Other Ambulatory Visit (HOSPITAL_BASED_OUTPATIENT_CLINIC_OR_DEPARTMENT_OTHER): Payer: Self-pay

## 2024-06-17 ENCOUNTER — Ambulatory Visit: Admitting: Student

## 2024-08-08 ENCOUNTER — Ambulatory Visit: Admitting: Student

## 2024-08-27 ENCOUNTER — Ambulatory Visit: Admitting: Behavioral Health

## 2024-12-25 ENCOUNTER — Encounter
